# Patient Record
Sex: Male | Born: 1975
Health system: Southern US, Community
[De-identification: ages and names within clinical notes are randomized; demographics above are authoritative.]

## PROBLEM LIST (undated history)

## (undated) DIAGNOSIS — J449 Chronic obstructive pulmonary disease, unspecified: Secondary | ICD-10-CM

## (undated) DIAGNOSIS — J45909 Unspecified asthma, uncomplicated: Secondary | ICD-10-CM

## (undated) DIAGNOSIS — F17210 Nicotine dependence, cigarettes, uncomplicated: Secondary | ICD-10-CM

## (undated) DIAGNOSIS — I428 Other cardiomyopathies: Secondary | ICD-10-CM

## (undated) DIAGNOSIS — N182 Chronic kidney disease, stage 2 (mild): Secondary | ICD-10-CM

## (undated) DIAGNOSIS — I639 Cerebral infarction, unspecified: Secondary | ICD-10-CM

## (undated) DIAGNOSIS — I5022 Chronic systolic (congestive) heart failure: Secondary | ICD-10-CM

## (undated) DIAGNOSIS — Z9289 Personal history of other medical treatment: Secondary | ICD-10-CM

## (undated) DIAGNOSIS — Z9889 Other specified postprocedural states: Secondary | ICD-10-CM

## (undated) DIAGNOSIS — F1910 Other psychoactive substance abuse, uncomplicated: Secondary | ICD-10-CM

## (undated) HISTORY — DX: Chronic systolic (congestive) heart failure: I50.22

## (undated) HISTORY — DX: Nicotine dependence, cigarettes, uncomplicated: F17.210

## (undated) HISTORY — DX: Other psychoactive substance abuse, uncomplicated: F19.10

## (undated) HISTORY — DX: Other specified postprocedural states: Z98.890

## (undated) HISTORY — DX: Personal history of other medical treatment: Z92.89

## (undated) HISTORY — DX: Other cardiomyopathies: I42.8

---
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
## 2015-06-15 ENCOUNTER — Encounter (HOSPITAL_COMMUNITY): Payer: Self-pay | Admitting: Emergency Medicine

## 2015-06-15 ENCOUNTER — Emergency Department (HOSPITAL_COMMUNITY): Payer: Self-pay

## 2015-06-15 ENCOUNTER — Emergency Department (HOSPITAL_COMMUNITY)
Admission: EM | Admit: 2015-06-15 | Discharge: 2015-06-15 | Disposition: A | Discharge status: Home / Self Care | Payer: Self-pay | Attending: Emergency Medicine | Admitting: Emergency Medicine

## 2015-06-15 DIAGNOSIS — Z880 Allergy status to penicillin: Secondary | ICD-10-CM | POA: Insufficient documentation

## 2015-06-15 DIAGNOSIS — J45901 Unspecified asthma with (acute) exacerbation: Secondary | ICD-10-CM | POA: Insufficient documentation

## 2015-06-15 DIAGNOSIS — Z79899 Other long term (current) drug therapy: Secondary | ICD-10-CM | POA: Insufficient documentation

## 2015-06-15 HISTORY — DX: Unspecified asthma, uncomplicated: J45.909

## 2015-06-15 MED ORDER — DEXAMETHASONE 4 MG PO TABS
16.0000 mg | ORAL_TABLET | Freq: Once | ORAL | Status: AC
  Administered 2015-06-15: 16 mg via ORAL
  Filled 2015-06-15: qty 4

## 2015-06-15 MED ORDER — ALBUTEROL SULFATE HFA 108 (90 BASE) MCG/ACT IN AERS
2.0000 | INHALATION_SPRAY | Freq: Once | RESPIRATORY_TRACT | Status: AC
  Administered 2015-06-15: 2 via RESPIRATORY_TRACT
  Filled 2015-06-15: qty 6.7

## 2015-06-15 MED ORDER — ALBUTEROL SULFATE (2.5 MG/3ML) 0.083% IN NEBU
5.0000 mg | INHALATION_SOLUTION | Freq: Once | RESPIRATORY_TRACT | Status: AC
  Administered 2015-06-15: 5 mg via RESPIRATORY_TRACT
  Filled 2015-06-15: qty 6

## 2015-06-15 NOTE — Discharge Instructions (Signed)
Asthma, Adult Asthma is a recurring condition in which the airways tighten and narrow. Asthma can make it difficult to breathe. It can cause coughing, wheezing, and shortness of breath. Asthma episodes, also called asthma attacks, range from minor to life-threatening. Asthma cannot be cured, but medicines and lifestyle changes can help control it. CAUSES Asthma is believed to be caused by inherited (genetic) and environmental factors, but its exact cause is unknown. Asthma may be triggered by allergens, lung infections, or irritants in the air. Asthma triggers are different for each person. Common triggers include:   Animal dander.  Dust mites.  Cockroaches.  Pollen from trees or grass.  Mold.  Smoke.  Air pollutants such as dust, household cleaners, hair sprays, aerosol sprays, paint fumes, strong chemicals, or strong odors.  Cold air, weather changes, and winds (which increase molds and pollens in the air).  Strong emotional expressions such as crying or laughing hard.  Stress.  Certain medicines (such as aspirin) or types of drugs (such as beta-blockers).  Sulfites in foods and drinks. Foods and drinks that may contain sulfites include dried fruit, potato chips, and sparkling grape juice.  Infections or inflammatory conditions such as the flu, a cold, or an inflammation of the nasal membranes (rhinitis).  Gastroesophageal reflux disease (GERD).  Exercise or strenuous activity. SYMPTOMS Symptoms may occur immediately after asthma is triggered or many hours later. Symptoms include:  Wheezing.  Excessive nighttime or early morning coughing.  Frequent or severe coughing with a common cold.  Chest tightness.  Shortness of breath. DIAGNOSIS  The diagnosis of asthma is made by a review of your medical history and a physical exam. Tests may also be performed. These may include:  Lung function studies. These tests show how much air you breathe in and out.  Allergy  tests.  Imaging tests such as X-rays. TREATMENT  Asthma cannot be cured, but it can usually be controlled. Treatment involves identifying and avoiding your asthma triggers. It also involves medicines. There are 2 classes of medicine used for asthma treatment:   Controller medicines. These prevent asthma symptoms from occurring. They are usually taken every day.  Reliever or rescue medicines. These quickly relieve asthma symptoms. They are used as needed and provide short-term relief. Your health care provider will help you create an asthma action plan. An asthma action plan is a written plan for managing and treating your asthma attacks. It includes a list of your asthma triggers and how they may be avoided. It also includes information on when medicines should be taken and when their dosage should be changed. An action plan may also involve the use of a device called a peak flow meter. A peak flow meter measures how well the lungs are working. It helps you monitor your condition. HOME CARE INSTRUCTIONS   Take medicines only as directed by your health care provider. Speak with your health care provider if you have questions about how or when to take the medicines.  Use a peak flow meter as directed by your health care provider. Record and keep track of readings.  Understand and use the action plan to help minimize or stop an asthma attack without needing to seek medical care.  Control your home environment in the following ways to help prevent asthma attacks:  Do not smoke. Avoid being exposed to secondhand smoke.  Change your heating and air conditioning filter regularly.  Limit your use of fireplaces and wood stoves.  Get rid of pests (such as roaches  and mice) and their droppings.  Throw away plants if you see mold on them.  Clean your floors and dust regularly. Use unscented cleaning products.  Try to have someone else vacuum for you regularly. Stay out of rooms while they are  being vacuumed and for a short while afterward. If you vacuum, use a dust mask from a hardware store, a double-layered or microfilter vacuum cleaner bag, or a vacuum cleaner with a HEPA filter.  Replace carpet with wood, tile, or vinyl flooring. Carpet can trap dander and dust.  Use allergy-proof pillows, mattress covers, and box spring covers.  Wash bed sheets and blankets every week in hot water and dry them in a dryer.  Use blankets that are made of polyester or cotton.  Clean bathrooms and kitchens with bleach. If possible, have someone repaint the walls in these rooms with mold-resistant paint. Keep out of the rooms that are being cleaned and painted.  Wash hands frequently. SEEK MEDICAL CARE IF:   You have wheezing, shortness of breath, or a cough even if taking medicine to prevent attacks.  The colored mucus you cough up (sputum) is thicker than usual.  Your sputum changes from clear or white to yellow, green, gray, or bloody.  You have any problems that may be related to the medicines you are taking (such as a rash, itching, swelling, or trouble breathing).  You are using a reliever medicine more than 2-3 times per week.  Your peak flow is still at 50-79% of your personal best after following your action plan for 1 hour.  You have a fever. SEEK IMMEDIATE MEDICAL CARE IF:   You seem to be getting worse and are unresponsive to treatment during an asthma attack.  You are short of breath even at rest.  You get short of breath when doing very little physical activity.  You have difficulty eating, drinking, or talking due to asthma symptoms.  You develop chest pain.  You develop a fast heartbeat.  You have a bluish color to your lips or fingernails.  You are light-headed, dizzy, or faint.  Your peak flow is less than 50% of your personal best.   This information is not intended to replace advice given to you by your health care provider. Make sure you discuss any  questions you have with your health care provider.   Document Released: 08/08/2005 Document Revised: 04/29/2015 Document Reviewed: 03/07/2013 Elsevier Interactive Patient Education 2016 [employer].   Emergency Department Resource Guide 1) Find a Doctor and Pay Out of Pocket Although you won't have to find out who is covered by your insurance plan, it is a good idea to ask around and get recommendations. You will then need to call the office and see if the doctor you have chosen will accept you as a new patient and what types of options they offer for patients who are self-pay. Some doctors offer discounts or will set up payment plans for their patients who do not have insurance, but you will need to ask so you aren't surprised when you get to your appointment.  2) Contact Your Local Health Department Not all health departments have doctors that can see patients for sick visits, but many do, so it is worth a call to see if yours does. If you don't know where your local health department is, you can check in your phone book. The CDC also has a tool to help you locate your state's health department, and many state websites also  listings of all of their local health departments. ° °3) Find a Walk-in Clinic °If your illness is not likely to be very severe or complicated, you may want to try a walk in clinic. These are popping up all over the country in pharmacies, drugstores, and shopping centers. They're usually staffed by nurse practitioners or physician assistants that have been trained to treat common illnesses and complaints. They're usually fairly quick and inexpensive. However, if you have serious medical issues or chronic medical problems, these are probably not your best option. ° °No Primary Care Doctor: °- Call [facility] at  832-8000 - they can help you locate a primary care doctor that  accepts your insurance, provides certain services, etc. °- Physician Referral Service-  1-800-533-3463 ° °Chronic Pain Problems: °Organization         Address  Phone   Notes  °[facility]  (336) 297-2271 Patients need to be referred by their primary care doctor.  ° °Medication Assistance: °Organization         Address  Phone   Notes  °[place] [facility] [address] °[place], [place] [address] (336) 641-8030 --Must be a resident of [place] °-- Must have NO insurance coverage whatsoever (no Medicaid/ Medicare, etc.) °-- The pt. MUST have a primary care doctor that directs their care regularly and follows them in the community °  °[facility]  (866) 331-1348   °[employer]  (888) 892-1162   ° °Agencies that provide inexpensive medical care: °Organization         Address  Phone   Notes  °[facility]  (336) 832-8035   °[facility]    (336) 832-7272   °[facility] [address] °[place], [place] [address] (336) 832-4777   °[facility] [address], °[place] (336) 271-4999   °[facility]    (336) 373-0678   °[facility]    (336) 272-1050   °[facility] ° [address], [place] Phone:  (336) 832-4444, Fax:  (336) 832-4440 Hours of Operation:  9 am - 6 pm, M-F.  Also accepts Medicaid/Medicare and self-pay.  °[facility] ° [address], [place] Phone: (336) 832-3150, Fax: (336) 832-3151. Hours of Operation:  8:30 am - 5:30 pm, M-F.  Also accepts Medicaid and self-pay.  °[facility] [place] [address], [place] Phone: (336) 878-6027   °[facility] [address], [place], [place] (336)723-1848, Ext. 123 Mondays & Thursdays: 7-9 AM.  First 15 patients are seen on a first come, first serve basis. °  ° °Medicaid-accepting [place] Providers: ° °Organization         Address  Phone   Notes  °[facility] [address],  [place] (336) 641-2100 Also accepts self-pay patients.  °[facility] [address], [place] ° (336) 856-9996   °[facility] [address], [place] (336) 288-8857   °[facility] [address] [place] [address], [place] (336) 299-7000   °Veita Bland [address],   ° (336) 373-1557 Only accepts [place] Access Medicaid patients after they have their name applied to their card.  ° °Self-Pay (no insurance) in [place]: ° °Organization         Address  Phone     Notes  °Sickle Cell Patients, [facility] [address], [place] (336) 832-1970   °[facility] [address], [place] (336) 832-4400   °[facility] [place] ° [address],  (336) 992-4800   °[facility]/Dr. Osei-Bonsu ° [address] [place] [address], [place] or [address], [place] (336) 841-8500 Phone number for both [place] and [place] locations is the same.  °[facility] [address], [place] (336) 299-0000   °[facility] [address] [place] [address], [place] or [address] (336) 852-7530 °(336) 878-2260   °[facility] [address], [place] (336) 350-1642, phone; (336) 294-5005, fax Sees patients 1st and 3rd Saturday of every month.  Must not qualify for public or private insurance (i.e. Medicaid, Medicare, [place] Health Choice, Veterans' Benefits) • Household income should be no more than 200% of the poverty level •The clinic cannot treat you if you are pregnant or think you are pregnant • Sexually transmitted diseases are not treated at the clinic.  ° ° °Dental Care: °Organization         Address  Phone  Notes  °[facility] [address], [place] (336) 641-6152 Accepts children up to age 21 who are enrolled in  Medicaid or [place] Health Choice; pregnant women with a Medicaid card; and children who have applied for Medicaid or [place] Health Choice, but were declined, whose parents can pay a reduced fee at time of service.  °[facility] [place]  [address], [place] (336) 641-7733 Accepts children up to age 21 who are enrolled in Medicaid or [place] Health Choice; pregnant women with a Medicaid card; and children who have applied for Medicaid or [place] Health Choice, but were declined, whose parents can pay a reduced fee at time of service.  °[facility] ° [address],  (336) 641-4533 Patients are seen by appointment only. Walk-ins are not accepted. [facility] will see patients 18 years of age and older. °Monday - Tuesday (8am-5pm) °Most Wednesdays (8:30-5pm) °$30 per visit, cash only  °[facility] ° [address], [place] (336) 641-4533 Patients are seen by appointment only. Walk-ins are not accepted. [facility] will see patients 18 years of age and older. °One Wednesday Evening (Monthly: Volunteer Based).  $30 per visit, cash only  °[facility]  (919) 537-3737 for adults; Children under age 4, call Graduate Pediatric Dentistry at ([phone]) [phone]. Children aged 4-14, please call ([phone]) [phone] to request a pediatric application. ° Dental services are provided in all areas of dental care including fillings, crowns and bridges, complete and partial dentures, implants, gum treatment, root canals, and extractions. Preventive care is also provided. Treatment is provided to both adults and children. °Patients are selected via a lottery and there is often a waiting list. °  °[facility] [address], ° ° (336) 763-8833 [email or web address].drcivils.[email or web address] °  °[facility] [address], [place], [place] (336)723-1848, Ext. 123 Second and Fourth Thursday of each month, opens at 6:30  AM; Clinic ends at 9 AM.  Patients are seen on a first-come first-served basis, and a limited number are seen during each clinic.  ° °[facility] ° [address], [place], [place] (336) 723-7904     Eligibility Requirements °You must have lived in [place], or [place] counties for at least the last three months. °  You cannot be eligible for state or federal sponsored healthcare insurance, including Veterans Administration, Medicaid, or Medicare. °  You generally cannot be eligible for healthcare insurance through your employer.  °  How to apply: °Eligibility screenings are held every Tuesday and Wednesday afternoon from 1:00 pm until 4:00 pm. You do not need an appointment for the interview!  °[facility] [address], [place], [place] 336-631-2330   °[facility]  336-342-8273   °[facility]  336-703-3100   °[facility]  336-570-6415   ° °Behavioral Health Resources in the Community: °Intensive Outpatient Programs °Organization         Address  Phone  Notes  °[facility] [address], [place], [place] 336-878-6098   °[facility] [address], [place], [place] 336-832-9800   °[facility] [address], [place], [place] ° 336-882-2125   °[facility] [address],  °[place], [place] 1-800-853-5163 or 336-641-4981   °Substance Abuse Resources °Organization         Address  Phone  Notes  °[facility]  336-882-2125   °[facility]  336-784-9470   °[facility]  336-285-9073   °[facility]  336-845-3988   °Residential & Outpatient Substance Abuse Program  1-800-659-3381   °Psychological Services °Organization         Address  Phone  Notes  °[facility]  336- 832-9600   °[facility]  336- 378-7881   °[facility] [address], [place] 1-800-853-5163 or  336-641-4981   ° °Mobile Crisis Teams °Organization         Address  Phone  Notes  °[facility], Mobile Crisis Care Unit  1-877-626-1772   °Assertive °[facility] ° [address] [place], [place] 336-834-9664   °Sharon DeEsch [address] °[place] 336-554-5454   ° °Self-Help/Support Groups °Organization         Address  Phone             Notes  °[facility] - variety of support groups  336- 373-1402 Call for more information  °Narcotics Anonymous (NA), [facility] [address], °[place]  2 meetings at this location  ° °Residential Treatment Programs °Organization         Address  Phone  Notes  °[facility] [address],    °[place]  1-866-801-8205   °[facility] ° [address], [place], [place] 704-293-8524   °[facility] [address], [place] 336-845-3988 Admissions: 8am-3pm M-F  °[facility] [address]    °[place], [place] 336-841-1104   °[facility] [address], [place], [place] 336-379-7146   °[facility] [address]  °[place], [place] 336-285-9073   °[facility] Intensive Outpatient [address], [place], [place] 336-852-3033   °[facility] [address]  °[place], [place] 1-877-615-2722 or 336-784-9470   °[facility] [address] [place], [place] 336-227-7417 Accepts Medicaid  °[facility] [address]  °  1-800-659-3381 Substance Abuse/Addiction Treatment  ° °[place] Behavioral Health Resources °Organization           Address  Phone  Notes  [facility]  4402682006   Domenic Schwab, PhD [address] [place], [place]   618 458 9163 or (418) 171-6990   [place], [place] 516-798-4479   [address],  [place], [place] 647-242-8254 Insurance/Medicaid/sponsorship through [facility] [address]                                    [place], [place] (832)[phone] [place] [address], [place] (424)[phone]    Dr. Adele Schilder  ([phone]) [phone]   [facility] Dept. 1) [address] [place] 2) [place] 3)  [address], [place] [phone]  [phone]   [place] [phone] or [phone] (After Hours)

## 2015-06-15 NOTE — ED Notes (Signed)
Per pt, states SOB for a couple of days-states history of asthma, inhaler not working-states occasional sharp pain on left chest

## 2015-06-26 NOTE — ED Provider Notes (Signed)
CSN: [id number]     Arrival date & time 06/15/15  0902 History   First MD Initiated Contact with Patient 06/15/15 0919     Chief Complaint  Patient presents with  . Shortness of Breath     (Consider location/radiation/quality/duration/timing/severity/associated sxs/prior Treatment) HPI   39 year old male with dyspnea. Gradual onset about 2 days ago. Symptoms have been constant, but stable. He is currently out of his rescue inhaler. Occasional nonproductive cough. Denies any pain. No fever. No unusual leg pain or swelling. No sick contacts.   Past Medical History  Diagnosis Date  . Asthma    No past surgical history on file. No family history on file. Social History  Substance Use Topics  . Smoking status: Never Smoker   . Smokeless tobacco: None  . Alcohol Use: No    Review of Systems  All systems reviewed and negative, other than as noted in HPI.   Allergies  Penicillins  Home Medications   Prior to Admission medications   Medication Sig Start Date End Date Taking? Authorizing Provider  albuterol (PROVENTIL HFA;VENTOLIN HFA) 108 (90 BASE) MCG/ACT inhaler Inhale 1-2 puffs into the lungs every 6 (six) hours as needed for wheezing or shortness of breath.   Yes Historical Provider, MD  naproxen sodium (ANAPROX) 220 MG tablet Take 440 mg by mouth 2 (two) times daily as needed (pain).   Yes Historical Provider, MD   BP 126/74 mmHg  Pulse 78  Temp(Src) 98.5 F (36.9 C) (Oral)  Resp 13  SpO2 99% Physical Exam  Constitutional: He appears well-developed and well-nourished. No distress.  HENT:  Head: Normocephalic and atraumatic.  Eyes: Conjunctivae are normal. Right eye exhibits no discharge. Left eye exhibits no discharge.  Neck: Neck supple.  Cardiovascular: Normal rate, regular rhythm and normal heart sounds.  Exam reveals no gallop and no friction rub.   No murmur heard. Pulmonary/Chest: Effort normal. No respiratory distress. He has wheezes.  Space in complete  sentences. No increased work of breathing. Mild expiratory wheezing bilaterally.  Abdominal: Soft. He exhibits no distension. There is no tenderness.  Musculoskeletal: He exhibits no edema or tenderness.  Neurological: He is alert.  Skin: Skin is warm and dry.  Psychiatric: He has a normal mood and affect. His behavior is normal. Thought content normal.  Nursing note and vitals reviewed.   ED Course  Procedures (including critical care time) Labs Review Labs Reviewed - No data to display  Imaging Review No results found. I have personally reviewed and evaluated these images and lab results as part of my medical decision-making.   EKG Interpretation   Date/Time:  Monday June 15 2015 09:16:14 EDT Ventricular Rate:  79 PR Interval:  152 QRS Duration: 91 QT Interval:  392 QTC Calculation: 449 R Axis:   79 Text Interpretation:  Sinus rhythm Consider left ventricular hypertrophy  ED PHYSICIAN INTERPRETATION AVAILABLE IN [facility] Confirmed by  TEST, Record ([id number]) on 06/16/2015 7:55:00 AM      MDM   Final diagnoses:  Asthma exacerbation    39 year old male with dyspnea. Likely asthma exacerbation. Some mild wheezing on exam. No increased work of breathing. o2 sats normal on RA. Afebrile. Generally well appearing. Patient is currently out of his rescue inhaler. He was provided 1 on discharge.    Raeford Razor, MD 06/26/15 1440

## 2015-09-17 ENCOUNTER — Emergency Department (HOSPITAL_COMMUNITY)
Admission: EM | Admit: 2015-09-17 | Discharge: 2015-09-17 | Disposition: A | Discharge status: Home / Self Care | Payer: Self-pay | Attending: Emergency Medicine | Admitting: Emergency Medicine

## 2015-09-17 ENCOUNTER — Encounter (HOSPITAL_COMMUNITY): Payer: Self-pay | Admitting: Emergency Medicine

## 2015-09-17 DIAGNOSIS — Z79899 Other long term (current) drug therapy: Secondary | ICD-10-CM | POA: Insufficient documentation

## 2015-09-17 DIAGNOSIS — Z880 Allergy status to penicillin: Secondary | ICD-10-CM | POA: Insufficient documentation

## 2015-09-17 DIAGNOSIS — J4521 Mild intermittent asthma with (acute) exacerbation: Secondary | ICD-10-CM | POA: Insufficient documentation

## 2015-09-17 MED ORDER — IPRATROPIUM-ALBUTEROL 0.5-2.5 (3) MG/3ML IN SOLN
3.0000 mL | Freq: Once | RESPIRATORY_TRACT | Status: AC
  Administered 2015-09-17: 3 mL via RESPIRATORY_TRACT
  Filled 2015-09-17: qty 3

## 2015-09-17 MED ORDER — PREDNISONE 20 MG PO TABS
60.0000 mg | ORAL_TABLET | Freq: Once | ORAL | Status: AC
  Administered 2015-09-17: 60 mg via ORAL
  Filled 2015-09-17: qty 3

## 2015-09-17 MED ORDER — PREDNISONE 20 MG PO TABS: ORAL_TABLET | ORAL | Status: DC

## 2015-09-17 NOTE — ED Notes (Signed)
Per pt, states asthma symptoms-doesn't have an inhaler

## 2015-09-17 NOTE — Discharge Instructions (Signed)
Asthma, Acute Bronchospasm °Acute bronchospasm caused by asthma is also referred to as an asthma attack. Bronchospasm means your air passages become narrowed. The narrowing is caused by inflammation and tightening of the muscles in the air tubes (bronchi) in your lungs. This can make it hard to breathe or cause you to wheeze and cough. °CAUSES °Possible triggers are: °· Animal dander from the skin, hair, or feathers of animals. °· Dust mites contained in house dust. °· Cockroaches. °· Pollen from trees or grass. °· Mold. °· Cigarette or tobacco smoke. °· Air pollutants such as dust, household cleaners, hair sprays, aerosol sprays, paint fumes, strong chemicals, or strong odors. °· Cold air or weather changes. Cold air may trigger inflammation. Winds increase molds and pollens in the air. °· Strong emotions such as crying or laughing hard. °· Stress. °· Certain medicines such as aspirin or beta-blockers. °· Sulfites in foods and drinks, such as dried fruits and wine. °· Infections or inflammatory conditions, such as a flu, cold, or inflammation of the nasal membranes (rhinitis). °· Gastroesophageal reflux disease (GERD). GERD is a condition where stomach acid backs up into your esophagus. °· Exercise or strenuous activity. °SIGNS AND SYMPTOMS  °· Wheezing. °· Excessive coughing, particularly at night. °· Chest tightness. °· Shortness of breath. °DIAGNOSIS  °Your health care provider will ask you about your medical history and perform a physical exam. A chest X-ray or blood testing may be performed to look for other causes of your symptoms or other conditions that may have triggered your asthma attack.  °TREATMENT  °Treatment is aimed at reducing inflammation and opening up the airways in your lungs.  Most asthma attacks are treated with inhaled medicines. These include quick relief or rescue medicines (such as bronchodilators) and controller medicines (such as inhaled corticosteroids). These medicines are sometimes  given through an inhaler or a nebulizer. Systemic steroid medicine taken by mouth or given through an IV tube also can be used to reduce the inflammation when an attack is moderate or severe. Antibiotic medicines are only used if a bacterial infection is present.  °HOME CARE INSTRUCTIONS  °· Rest. °· Drink plenty of liquids. This helps the mucus to remain thin and be easily coughed up. Only use caffeine in moderation and do not use alcohol until you have recovered from your illness. °· Do not smoke. Avoid being exposed to secondhand smoke. °· You play a critical role in keeping yourself in good health. Avoid exposure to things that cause you to wheeze or to have breathing problems. °· Keep your medicines up-to-date and available. Carefully follow your health care provider's treatment plan. °· Take your medicine exactly as prescribed. °· When pollen or pollution is bad, keep windows closed and use an air conditioner or go to places with air conditioning. °· Asthma requires careful medical care. See your health care provider for a follow-up as advised. If you are more than [redacted] weeks pregnant and you were prescribed any new medicines, let your obstetrician know about the visit and how you are doing. Follow up with your health care provider as directed. °· After you have recovered from your asthma attack, make an appointment with your outpatient doctor to talk about ways to reduce the likelihood of future attacks. If you do not have a doctor who manages your asthma, make an appointment with a primary care doctor to discuss your asthma. °SEEK IMMEDIATE MEDICAL CARE IF:  °· You are getting worse. °· You have trouble breathing. If severe, call your local   emergency services (911 in the U.[place]  You develop chest pain or discomfort.  You are vomiting.  You are not able to keep fluids down.  You are coughing up yellow, green, brown, or bloody sputum.  You have a fever and your symptoms suddenly get worse.  You have  trouble swallowing. MAKE SURE YOU:   Understand these instructions.  Will watch your condition.  Will get help right away if you are not doing well or get worse.   This information is not intended to replace advice given to you by your health care provider. Make sure you discuss any questions you have with your health care provider.   Document Released: 11/23/2006 Document Revised: 08/13/2013 Document Reviewed: 02/13/2013 Elsevier Interactive Patient Education 2016 [employer].   Emergency Department Resource Guide 1) Find a Doctor and Pay Out of Pocket Although you won't have to find out who is covered by your insurance plan, it is a good idea to ask around and get recommendations. You will then need to call the office and see if the doctor you have chosen will accept you as a new patient and what types of options they offer for patients who are self-pay. Some doctors offer discounts or will set up payment plans for their patients who do not have insurance, but you will need to ask so you aren't surprised when you get to your appointment.  2) Contact Your Local Health Department Not all health departments have doctors that can see patients for sick visits, but many do, so it is worth a call to see if yours does. If you don't know where your local health department is, you can check in your phone book. The CDC also has a tool to help you locate your state's health department, and many state websites also have listings of all of their local health departments.  3) Find a [facility] If your illness is not likely to be very severe or complicated, you may want to try a walk in clinic. These are popping up all over the country in pharmacies, drugstores, and shopping centers. They're usually staffed by nurse practitioners or physician assistants that have been trained to treat common illnesses and complaints. They're usually fairly quick and inexpensive. However, if you have serious medical  issues or chronic medical problems, these are probably not your best option.  No Primary Care Doctor: - Call Health Connect at  [phone] - they can help you locate a primary care doctor that  accepts your insurance, provides certain services, etc. - Physician Referral Service- [phone]  Chronic Pain Problems: Organization         Address  Phone   Notes  [facility]  [phone] Patients need to be referred by their primary care doctor.   Medication Assistance: Organization         Address  Phone   Notes  [facility] Medication [facility] [address] [place], [place] [address] [phone] --Must be a resident of [facility] -- Must have NO insurance coverage whatsoever (no Medicaid/ Medicare, etc.) -- The pt. MUST have a primary care doctor that directs their care regularly and follows them in the community   [facility]ist  [phone]   [employer]  [phone]    Agencies that provide inexpensive medical care: Organization         Address  Phone   Notes  [place]  [phone]   Gershon Mussel  [facility]    [phone]   [facility] [place], [place] [address] [phone]   [place] [address] [place] [phone]   Planned Parenthood    [phone]   [facility]    [phone]   [place] and [place] [address], [place] Phone:  [phone], Fax:  [phone] Hours of Operation:  9 am - 6 pm, M-F.  Also accepts Medicaid/Medicare and self-pay.  [facility] for [place], [address], [place] Phone: [phone], Fax: [phone]. Hours of Operation:  8:30 am - 5:30 pm, M-F.  Also accepts Medicaid and self-pay.  [facility] [address], [place] Phone: [phone]   [place], [place], [place]  [phone], Ext. [phone] Mondays & Thursdays: 7-9 AM.  First 15 patients are seen on a first come, first serve basis.    [place] Providers:  Organization         Address  Phone   Notes  [facility] [address], [place] [phone] Also accepts self-pay patients.  [facility] [id number] [place], [place]  [phone]   [place], [address], [place] [phone]   [facility] [address], [place] [phone]   Lucianne Lei [address], [place]   [phone] Only accepts [place] Access [place] patients after they have their name applied to their card.   Self-Pay (no insurance) in [facility]:  Organization         Address  Phone   Notes  Sickle Cell Patients, [facility] [place] [phone]   [facility] [place] [phone]   [facility] [place]  [place], [place],  [phone]   [facility]/Dr. Osei-Bonsu  [address], [place] or [address], [place] [phone] Phone number for both [place] and [place] locations is the same.  Urgent Medical and [facility] [address], [place] [phone]   [facility] [address], [place] or [address] [phone]   [facility] [address] [place] [phone], phone; [phone], fax Sees patients 1st and 3rd Saturday of every month.  Must not qualify for public or private insurance (i.e. Medicaid, Medicare, [place] Health Choice, Veterans' Benefits)  Household income should be no more than 200% of the poverty level The clinic cannot treat you if you are pregnant or think you are pregnant  Sexually transmitted  diseases are not treated at the clinic.    Dental Care: Organization         Address  Phone  Notes  [facility] Department of [facility] [place] [phone] Accepts children up to age 52 who are enrolled in [place] or [place]; pregnant women with a Medicaid card; and children who have applied for Medicaid or [place] Health Choice, but were declined, whose parents can pay a reduced fee at time of service.  [facility] Department of [facility] [facility]  [address], [employer]  [place] [phone] Accepts children up to age 62 who are enrolled in Medicaid or [place]; pregnant women with a Medicaid card; and children who have applied for Medicaid or [place] Health Choice, but were declined, whose parents can pay a reduced fee at time of service.  [place] Adult Dental Access PROGRAM  [place] [phone] Patients are seen by appointment only. Walk-ins are not accepted. [place] will see patients 22 years of age and older. Monday - Tuesday (8am-5pm) Most Wednesdays (8:30-5pm) $30 per visit, cash only  [facility] Adult Dental Access PROGRAM  [address], [facility] [phone] Patients are seen by appointment only. Walk-ins are not accepted. [place] will see patients 76 years of age and older. One Wednesday Evening (Monthly: Volunteer Based).  $30 per visit, cash only  [place]  [phone] for adults; Children under age 15, call Graduate Pediatric Dentistry at [phone]. Children aged 43-14, please call [phone] to request a pediatric application.  Dental services are provided in all areas of dental care including fillings, crowns and bridges, complete and partial dentures, implants, gum treatment, root canals, and extractions. Preventive care is also provided. Treatment is provided to both adults and children. Patients are selected via a  lottery and there is often a waiting list.   [facility] [address], [place]  [phone] [email or web address]   [facility] [address] [place], [place] [phone], Ext. [phone] Second and Fourth Thursday of each month, opens at 6:30 AM; Clinic ends at 9 AM.  Patients are seen on a first-come first-served basis, and a limited number are seen during each clinic.   [facility]  [address] Hillard Danker [place], [place] [phone]   Eligibility Requirements You must have lived in [place], [place], or [place] counties for at least the last three months.   You cannot be eligible for state or federal sponsored [employer], including [employer], [place], or [employer].   You generally cannot be eligible for healthcare insurance through your employer.    How to apply: Eligibility screenings are held every Tuesday and Wednesday afternoon from 1:00 pm until 4:00 pm. You do not need an appointment for the interview!  [facility] [address], [place], [place]   [place]  [place] Department  [place]  [phone]    Behavioral Health Resources in the Community: Intensive Outpatient Programs Organization         Address  Phone  Notes  [place]. [address] [place], [place] [phone]   [facility] Outpatient [address] [place], [place]   ADS: Alcohol & Drug Svcs [address] [place], [place]   [place] [address]  [place], [place] or [phone]   Substance Abuse Resources Organization         Address  Phone  Notes  Alcohol and Drug Services  [phone]   [place]  [phone]   [facility] [place]   Chinita Pester  [phone]   Residential &  Outpatient Substance Abuse Program  [phone]   Psychological Services Organization         Address  Phone  Notes  [place]  [place]  [phone]  [place] [address], [place] or [phone]    Mobile Crisis Teams Organization         Address  Phone  Notes  [facility], Mobile Crisis Care Unit  [phone]   [facility]ive Psychotherapeutic Services  [address] [place], [place]   [place] [address], [place] [phone]    Self-Help/Support Groups Organization         Address  Phone             Notes  [place]. of [place] - variety of support groups  [place] Call for more information  Narcotics Anonymous (NA), Caring [facility] [address], [employer] [place]  2 meetings at this location   Special educational needs teacher         Address  Phone  Notes  ASAP Residential Treatment [place],    [place]  [phone]   [facility]  [address] [place] [id number], [place], [place]   [place], [place] [phone] Admissions: 8am-3pm M-F  Incentives Substance [place] [address]    [place], [place] [phone]   [facility] [address] [place], [place], [place]   [facility] [address]  [place], [place]   [facility] Programs - Intensive Outpatient [address] Kristeen [address], [place], [place]   [facility] ([place].) [place].,  [place], [place] [phone] or [phone]   [facility]idential Treatment Services (RTS) [address] [employer], [place] Accepts Medicaid  [facility] [place] [address],  [place] [phone] Substance Abuse/Addiction Treatment   [facility] Organization          Address  Phone  Notes  [facility]  [phone]   Domenic Schwab, PhD [address] [place]lis [place], [place]   [phone] or [phone]   [place], [place] [phone]   [facility] [address], [place], [place] [phone] Insurance/Medicaid/sponsorship through [facility] and Families [address] [place]                                    [place], [place] [phone] [place] [address], [place] [phone]    Dr. Adele Schilder  [phone]   [facility] Dept. 1) [address] [place] 2) [place] 3)  [place] [address], [place] [phone]  [phone]   [place] [phone] or [phone] (After Hours)

## 2015-09-17 NOTE — ED Provider Notes (Signed)
CSN: [id number]     Arrival date & time 09/17/15  1254 History  By signing my name below, I, Mark [name], attest that this documentation has been prepared under the direction and in the presence of non-physician practitioner, Fayrene Helper, PA-C. Electronically Signed: Freida [name], Scribe. 09/17/2015. 2:21 PM.  Chief Complaint  Patient presents with  . Asthma   The history is provided by the patient. No language interpreter was used.    HPI Comments:  Mark [name] is a 40 y.o. male with a history of asthma who presents to the Emergency Department complaining of progressively worsening SOB x 2-3 months. Pt was previously  incarcerated and his asthma was well controlled with albuterol inhaler and homeolab real relief asthma respiratory care at that time; he was released ~3 months ago. He reports associated wheezing and  dry cough. His symptoms are exacerbated with exertion. Pt denies congestion, sore throat, sneezing, rhinorrhea, and fever. No alleviating factors noted. He does not currently have any treatments at home. He smokes ~  1 pack every 2 days. He denies h/o asthma exacerbation requiring intubation or hospitalization.    Past Medical History  Diagnosis Date  . Asthma    History reviewed. No pertinent past surgical history. No family history on file. Social History  Substance Use Topics  . Smoking status: Never Smoker   . Smokeless tobacco: None  . Alcohol Use: No    Review of Systems  Constitutional: Negative for fever.  HENT: Negative for congestion, rhinorrhea, sneezing and sore throat.   Respiratory: Positive for cough, shortness of breath and wheezing.      Allergies  Penicillins  Home Medications   Prior to Admission medications   Medication Sig Start Date End Date Taking? Authorizing Provider  albuterol (PROVENTIL HFA;VENTOLIN HFA) 108 (90 BASE) MCG/ACT inhaler Inhale 1-2 puffs into the lungs every 6 (six) hours as needed for wheezing or shortness of breath.     Historical Provider, MD  naproxen sodium (ANAPROX) 220 MG tablet Take 440 mg by mouth 2 (two) times daily as needed (pain).    Historical Provider, MD   BP 116/74 mmHg  Pulse 109  Temp(Src) 98 F (36.7 C) (Oral)  Resp 20  SpO2 98% Physical Exam  Constitutional: He is oriented to person, place, and time. He appears well-developed and well-nourished. No distress.  HENT:  Head: Normocephalic and atraumatic.  Eyes: Conjunctivae are normal.  Cardiovascular: Normal rate.   Pulmonary/Chest: Effort normal. No respiratory distress. He has no wheezes. He has no rales.  Decreased breathe sounds without wheezes or rhonchi   Abdominal: He exhibits no distension.  Neurological: He is alert and oriented to person, place, and time.  Skin: Skin is warm and dry.  Psychiatric: He has a normal mood and affect.  Nursing note and vitals reviewed.   ED Course  Procedures   DIAGNOSTIC STUDIES:  Oxygen Saturation is 98% on RA, normal by my interpretation.    COORDINATION OF CARE:  1:51 PM Will administer breathing treatment in ED and discharge home with inhaler and steroids. Discussed treatment plan with pt at bedside and pt agreed to plan.    MDM   Final diagnoses:  Asthma exacerbation attacks, mild intermittent    BP 116/74 mmHg  Pulse 109  Temp(Src) 98 F (36.7 C) (Oral)  Resp 20  SpO2 100%   Patient with mild signs and symptoms of asthma. Oxygen saturation is above 90%. No accessory muscle use, no cyanosis. Duoneb given in the ED.  Patient feels improved after treatment. He ambulates without any respiratory discomfort.  Will discharge with prednisone course. Pt instructed to follow up with PCP. Patient hemodynamically stable. Discussed return precautions. Appears safe for discharge.    I personally performed the services described in this documentation, which was scribed in my presence. The recorded information has been reviewed and is accurate.      Fayrene Helper, PA-C 09/17/15  1429  Benjiman Core, MD 09/17/15 1535

## 2015-10-20 ENCOUNTER — Emergency Department (HOSPITAL_COMMUNITY)
Admission: EM | Admit: 2015-10-20 | Discharge: 2015-10-20 | Disposition: A | Discharge status: Home / Self Care | Payer: Self-pay | Attending: Emergency Medicine | Admitting: Emergency Medicine

## 2015-10-20 ENCOUNTER — Encounter (HOSPITAL_COMMUNITY): Payer: Self-pay | Admitting: Cardiology

## 2015-10-20 DIAGNOSIS — R0602 Shortness of breath: Secondary | ICD-10-CM

## 2015-10-20 DIAGNOSIS — J45901 Unspecified asthma with (acute) exacerbation: Secondary | ICD-10-CM | POA: Insufficient documentation

## 2015-10-20 DIAGNOSIS — F17200 Nicotine dependence, unspecified, uncomplicated: Secondary | ICD-10-CM | POA: Insufficient documentation

## 2015-10-20 NOTE — ED Notes (Signed)
Pt reports SOb and wheezing that started a couple of days ago. Also reports a hx of asthma. States he is out of his inhaler at home.

## 2015-10-20 NOTE — ED Notes (Signed)
Pt refuses iv labs and xray. Will inform MD.

## 2015-10-20 NOTE — ED Notes (Signed)
Pt in room putting on clothing states he is leaving and will go to aniother hospital for treatmant.

## 2015-10-20 NOTE — ED Notes (Signed)
Changed to acuity 3 per T wave inversion.

## 2015-10-20 NOTE — ED Provider Notes (Signed)
MSE was initiated and I personally evaluated the patient and placed orders (if any) at  10:24 AM on October 20, 2015.  The patient appears stable so that the remainder of the MSE may be completed by another provider.  Pt here with cp and sob that has been going on for 1 week. He is out of his inhaler. He states that he is having some chest tightness and it was worse this morning. Pt is going to be moved out of fast track as ekg showed some t wave inversion  Teressa Lower, NP 10/20/15 1025  Rolland Porter, MD 10/20/15 1053

## 2015-11-29 ENCOUNTER — Emergency Department (HOSPITAL_COMMUNITY): Payer: Medicaid Other

## 2015-11-29 ENCOUNTER — Emergency Department (HOSPITAL_COMMUNITY)
Admission: EM | Admit: 2015-11-29 | Discharge: 2015-11-29 | Disposition: A | Discharge status: Home / Self Care | Payer: Medicaid Other | Attending: Emergency Medicine | Admitting: Emergency Medicine

## 2015-11-29 ENCOUNTER — Encounter (HOSPITAL_COMMUNITY): Payer: Self-pay | Admitting: Emergency Medicine

## 2015-11-29 DIAGNOSIS — R1110 Vomiting, unspecified: Secondary | ICD-10-CM | POA: Diagnosis not present

## 2015-11-29 DIAGNOSIS — Z880 Allergy status to penicillin: Secondary | ICD-10-CM | POA: Diagnosis not present

## 2015-11-29 DIAGNOSIS — J45901 Unspecified asthma with (acute) exacerbation: Secondary | ICD-10-CM | POA: Diagnosis not present

## 2015-11-29 DIAGNOSIS — Z79899 Other long term (current) drug therapy: Secondary | ICD-10-CM | POA: Insufficient documentation

## 2015-11-29 DIAGNOSIS — R05 Cough: Secondary | ICD-10-CM | POA: Diagnosis present

## 2015-11-29 DIAGNOSIS — F17200 Nicotine dependence, unspecified, uncomplicated: Secondary | ICD-10-CM | POA: Insufficient documentation

## 2015-11-29 MED ORDER — IBUPROFEN 800 MG PO TABS: 800.0000 mg | ORAL_TABLET | Freq: Once | ORAL | Status: DC

## 2015-11-29 MED ORDER — PREDNISONE 50 MG PO TABS: ORAL_TABLET | ORAL | Status: DC

## 2015-11-29 MED ORDER — ALBUTEROL SULFATE HFA 108 (90 BASE) MCG/ACT IN AERS
1.0000 | INHALATION_SPRAY | RESPIRATORY_TRACT | Status: DC | PRN
Start: 2015-11-29 — End: 2015-11-29
  Administered 2015-11-29: 1 via RESPIRATORY_TRACT
  Filled 2015-11-29: qty 6.7

## 2015-11-29 MED ORDER — IPRATROPIUM-ALBUTEROL 0.5-2.5 (3) MG/3ML IN SOLN
3.0000 mL | Freq: Once | RESPIRATORY_TRACT | Status: AC
  Administered 2015-11-29: 3 mL via RESPIRATORY_TRACT
  Filled 2015-11-29: qty 3

## 2015-11-29 MED ORDER — IBUPROFEN 800 MG PO TABS
800.0000 mg | ORAL_TABLET | Freq: Once | ORAL | Status: AC
  Administered 2015-11-29: 800 mg via ORAL
  Filled 2015-11-29: qty 1

## 2015-11-29 MED ORDER — PREDNISONE 20 MG PO TABS
60.0000 mg | ORAL_TABLET | Freq: Once | ORAL | Status: AC
  Administered 2015-11-29: 60 mg via ORAL
  Filled 2015-11-29: qty 3

## 2015-11-29 NOTE — ED Notes (Signed)
Pt states that he has had a cough x 3 months. Has been previously evaluated but cannot get rid of it. Smoker. Asthma. Alert and oriented.

## 2015-11-29 NOTE — ED Notes (Signed)
PT AMBULATED IN THE HALL, O2 SAT 97-99% ON R/A. NO SOB OR DIFFICULTY BREATHING WHILE WALKING. PROVIDER MADE AWARE.

## 2015-11-29 NOTE — ED Notes (Signed)
PT DISCHARGED. INSTRUCTIONS AND PRESCRIPTION GIVEN. AAOX3. PT IN NO APPARENT DISTRESS. THE OPPORTUNITY TO ASK QUESTIONS WAS PROVIDED. 

## 2015-11-29 NOTE — Discharge Instructions (Signed)
Asthma, Adult Asthma is a recurring condition in which the airways tighten and narrow. Asthma can make it difficult to breathe. It can cause coughing, wheezing, and shortness of breath. Asthma episodes, also called asthma attacks, range from minor to life-threatening. Asthma cannot be cured, but medicines and lifestyle changes can help control it. CAUSES Asthma is believed to be caused by inherited (genetic) and environmental factors, but its exact cause is unknown. Asthma may be triggered by allergens, lung infections, or irritants in the air. Asthma triggers are different for each person. Common triggers include:   Animal dander.  Dust mites.  Cockroaches.  Pollen from trees or grass.  Mold.  Smoke.  Air pollutants such as dust, household cleaners, hair sprays, aerosol sprays, paint fumes, strong chemicals, or strong odors.  Cold air, weather changes, and winds (which increase molds and pollens in the air).  Strong emotional expressions such as crying or laughing hard.  Stress.  Certain medicines (such as aspirin) or types of drugs (such as beta-blockers).  Sulfites in foods and drinks. Foods and drinks that may contain sulfites include dried fruit, potato chips, and sparkling grape juice.  Infections or inflammatory conditions such as the flu, a cold, or an inflammation of the nasal membranes (rhinitis).  Gastroesophageal reflux disease (GERD).  Exercise or strenuous activity. SYMPTOMS Symptoms may occur immediately after asthma is triggered or many hours later. Symptoms include:  Wheezing.  Excessive nighttime or early morning coughing.  Frequent or severe coughing with a common cold.  Chest tightness.  Shortness of breath. DIAGNOSIS  The diagnosis of asthma is made by a review of your medical history and a physical exam. Tests may also be performed. These may include:  Lung function studies. These tests show how much air you breathe in and out.  Allergy  tests.  Imaging tests such as X-rays. TREATMENT  Asthma cannot be cured, but it can usually be controlled. Treatment involves identifying and avoiding your asthma triggers. It also involves medicines. There are 2 classes of medicine used for asthma treatment:   Controller medicines. These prevent asthma symptoms from occurring. They are usually taken every day.  Reliever or rescue medicines. These quickly relieve asthma symptoms. They are used as needed and provide short-term relief. Your health care provider will help you create an asthma action plan. An asthma action plan is a written plan for managing and treating your asthma attacks. It includes a list of your asthma triggers and how they may be avoided. It also includes information on when medicines should be taken and when their dosage should be changed. An action plan may also involve the use of a device called a peak flow meter. A peak flow meter measures how well the lungs are working. It helps you monitor your condition. HOME CARE INSTRUCTIONS   Take medicines only as directed by your health care provider. Speak with your health care provider if you have questions about how or when to take the medicines.  Use a peak flow meter as directed by your health care provider. Record and keep track of readings.  Understand and use the action plan to help minimize or stop an asthma attack without needing to seek medical care.  Control your home environment in the following ways to help prevent asthma attacks:  Do not smoke. Avoid being exposed to secondhand smoke.  Change your heating and air conditioning filter regularly.  Limit your use of fireplaces and wood stoves.  Get rid of pests (such as roaches  and mice) and their droppings.  Throw away plants if you see mold on them.  Clean your floors and dust regularly. Use unscented cleaning products.  Try to have someone else vacuum for you regularly. Stay out of rooms while they are  being vacuumed and for a short while afterward. If you vacuum, use a dust mask from a hardware store, a double-layered or microfilter vacuum cleaner bag, or a vacuum cleaner with a HEPA filter.  Replace carpet with wood, tile, or vinyl flooring. Carpet can trap dander and dust.  Use allergy-proof pillows, mattress covers, and box spring covers.  Wash bed sheets and blankets every week in hot water and dry them in a dryer.  Use blankets that are made of polyester or cotton.  Clean bathrooms and kitchens with bleach. If possible, have someone repaint the walls in these rooms with mold-resistant paint. Keep out of the rooms that are being cleaned and painted.  Wash hands frequently. SEEK MEDICAL CARE IF:   You have wheezing, shortness of breath, or a cough even if taking medicine to prevent attacks.  The colored mucus you cough up (sputum) is thicker than usual.  Your sputum changes from clear or white to yellow, green, gray, or bloody.  You have any problems that may be related to the medicines you are taking (such as a rash, itching, swelling, or trouble breathing).  You are using a reliever medicine more than 2-3 times per week.  Your peak flow is still at 50-79% of your personal best after following your action plan for 1 hour.  You have a fever. SEEK IMMEDIATE MEDICAL CARE IF:   You seem to be getting worse and are unresponsive to treatment during an asthma attack.  You are short of breath even at rest.  You get short of breath when doing very little physical activity.  You have difficulty eating, drinking, or talking due to asthma symptoms.  You develop chest pain.  You develop a fast heartbeat.  You have a bluish color to your lips or fingernails.  You are light-headed, dizzy, or faint.  Your peak flow is less than 50% of your personal best.   This information is not intended to replace advice given to you by your health care provider. Make sure you discuss any  questions you have with your health care provider.   Document Released: 08/08/2005 Document Revised: 04/29/2015 Document Reviewed: 03/07/2013 Elsevier Interactive Patient Education 2016 [employer]. Smoking Cessation, Tips for Success If you are ready to quit smoking, congratulations! You have chosen to help yourself be healthier. Cigarettes bring nicotine, tar, carbon monoxide, and other irritants into your body. Your lungs, heart, and blood vessels will be able to work better without these poisons. There are many different ways to quit smoking. Nicotine gum, nicotine patches, a nicotine inhaler, or nicotine nasal spray can help with physical craving. Hypnosis, support groups, and medicines help break the habit of smoking. WHAT THINGS CAN I DO TO MAKE QUITTING EASIER?  Here are some tips to help you quit for good:  Pick a date when you will quit smoking completely. Tell all of your friends and family about your plan to quit on that date.  Do not try to slowly cut down on the number of cigarettes you are smoking. Pick a quit date and quit smoking completely starting on that day.  Throw away all cigarettes.   Clean and remove all ashtrays from your home, work, and car.  On a card, write  down your reasons for quitting. Carry the card with you and read it when you get the urge to smoke.  Cleanse your body of nicotine. Drink enough water and fluids to keep your urine clear or pale yellow. Do this after quitting to flush the nicotine from your body.  Learn to predict your moods. Do not let a bad situation be your excuse to have a cigarette. Some situations in your life might tempt you into wanting a cigarette.  Never have "just one" cigarette. It leads to wanting another and another. Remind yourself of your decision to quit.  Change habits associated with smoking. If you smoked while driving or when feeling stressed, try other activities to replace smoking. Stand up when drinking your coffee.  Brush your teeth after eating. Sit in a different chair when you read the paper. Avoid alcohol while trying to quit, and try to drink fewer caffeinated beverages. Alcohol and caffeine may urge you to smoke.  Avoid foods and drinks that can trigger a desire to smoke, such as sugary or spicy foods and alcohol.  Ask people who smoke not to smoke around you.  Have something planned to do right after eating or having a cup of coffee. For example, plan to take a walk or exercise.  Try a relaxation exercise to calm you down and decrease your stress. Remember, you may be tense and nervous for the first 2 weeks after you quit, but this will pass.  Find new activities to keep your hands busy. Play with a pen, coin, or rubber band. Doodle or draw things on paper.  Brush your teeth right after eating. This will help cut down on the craving for the taste of tobacco after meals. You can also try mouthwash.   Use oral substitutes in place of cigarettes. Try using lemon drops, carrots, cinnamon sticks, or chewing gum. Keep them handy so they are available when you have the urge to smoke.  When you have the urge to smoke, try deep breathing.  Designate your home as a nonsmoking area.  If you are a heavy smoker, ask your health care provider about a prescription for nicotine chewing gum. It can ease your withdrawal from nicotine.  Reward yourself. Set aside the cigarette money you save and buy yourself something nice.  Look for support from others. Join a support group or smoking cessation program. Ask someone at home or at work to help you with your plan to quit smoking.  Always ask yourself, "Do I need this cigarette or is this just a reflex?" Tell yourself, "Today, I choose not to smoke," or "I do not want to smoke." You are reminding yourself of your decision to quit.  Do not replace cigarette smoking with electronic cigarettes (commonly called e-cigarettes). The safety of e-cigarettes is unknown, and  some may contain harmful chemicals.  If you relapse, do not give up! Plan ahead and think about what you will do the next time you get the urge to smoke. HOW WILL I FEEL WHEN I QUIT SMOKING? You may have symptoms of withdrawal because your body is used to nicotine (the addictive substance in cigarettes). You may crave cigarettes, be irritable, feel very hungry, cough often, get headaches, or have difficulty concentrating. The withdrawal symptoms are only temporary. They are strongest when you first quit but will go away within 10-14 days. When withdrawal symptoms occur, stay in control. Think about your reasons for quitting. Remind yourself that these are signs that your body is  healing and getting used to being without cigarettes. Remember that withdrawal symptoms are easier to treat than the major diseases that smoking can cause.  Even after the withdrawal is over, expect periodic urges to smoke. However, these cravings are generally short lived and will go away whether you smoke or not. Do not smoke! WHAT RESOURCES ARE AVAILABLE TO HELP ME QUIT SMOKING? Your health care provider can direct you to community resources or hospitals for support, which may include:  Group support.  Education.  Hypnosis.  Therapy.   This information is not intended to replace advice given to you by your health care provider. Make sure you discuss any questions you have with your health care provider.   Document Released: 05/06/2004 Document Revised: 08/29/2014 Document Reviewed: 01/24/2013 Elsevier Interactive Patient Education [employer]

## 2015-11-29 NOTE — ED Provider Notes (Signed)
CSN: [id number]     Arrival date & time 11/29/15  1819 History  By signing my name below, I, Mark [name], attest that this documentation has been prepared under the direction and in the presence of  Mariabelen Pressly, PA-C. Electronically Signed: Doreatha [name], ED Scribe. 11/29/2015. 7:11 PM.      Chief Complaint  Patient presents with  . Cough   The history is provided by the patient. No language interpreter was used.   HPI Comments: Mark [name] is a 40 y.o. male with h/o asthma who presents to the Emergency Department complaining of moderate, intermittent, worsening cough for 3 months. Pt states associated exertional SOB, right-sided chest tenderness secondary to cough, wheezing, rhinorrhea, one episode of post-tussive emesis yesterday. He states this his chest pain is only present when coughing. He describes it as a soreness and "rubs on it" to improve it. Pt states that his current symptoms are similar to prior asthma exacerbations. He states that he ran out of his inhaler yesterday, but previously used it 1-2x/day. Pt notes this inhaler did not provide him complete relief of his symptoms. Per family member, he has been evaluated multiple times in the ED for his symptoms. Pt is not currently followed by a PCP. He is a current daily smoker at half pack per day. Denies sputum production. Denies fever, chills, dizziness, syncope, diaphoresis, abdominal pain, nausea, weakness or gait issues. He has no other complaints today.   Past Medical History  Diagnosis Date  . Asthma    History reviewed. No pertinent past surgical history. History reviewed. No pertinent family history. Social History  Substance Use Topics  . Smoking status: Current Every Day Smoker  . Smokeless tobacco: None  . Alcohol Use: No    Review of Systems  Constitutional: Negative for fever and chills.  HENT: Positive for rhinorrhea. Negative for congestion.   Respiratory: Positive for cough, shortness of breath and wheezing.    Cardiovascular: Positive for chest pain ( secondary to cough).  Gastrointestinal: Positive for vomiting ( post-tussive). Negative for nausea and abdominal pain.  All other systems reviewed and are negative.  Allergies  Hydrocodone and Penicillins  Home Medications   Prior to Admission medications   Medication Sig Start Date End Date Taking? Authorizing Provider  albuterol (PROVENTIL HFA;VENTOLIN HFA) 108 (90 BASE) MCG/ACT inhaler Inhale 1-2 puffs into the lungs every 6 (six) hours as needed for wheezing or shortness of breath.    Historical Provider, MD  naproxen sodium (ANAPROX) 220 MG tablet Take 440 mg by mouth 2 (two) times daily as needed (pain).    Historical Provider, MD  predniSONE (DELTASONE) 20 MG tablet 3 tabs po day one, then 2 tabs daily x 4 days 09/17/15   Fayrene Helper, PA-C  predniSONE (DELTASONE) 50 MG tablet Take one tablet once a day for 5 days 11/29/15   Yanky Vanderburg, PA-C   BP 105/61 mmHg  Pulse 100  Temp(Src) 98 F (36.7 C) (Oral)  Resp 20  SpO2 100% Physical Exam  Constitutional: He is oriented to person, place, and time. He appears well-developed and well-nourished. No distress.  No acute distress. Dry cough intermittently  HENT:  Head: Normocephalic and atraumatic.  Nose: Nose normal.  Mouth/Throat: Oropharynx is clear and moist. No oropharyngeal exudate.  Eyes: Conjunctivae are normal. Right eye exhibits no discharge. Left eye exhibits no discharge.  Neck: Normal range of motion. Neck supple.  Cardiovascular: Normal rate, regular rhythm and normal heart sounds.   Pulmonary/Chest: Effort normal.  No respiratory distress. He has wheezes. He exhibits tenderness (right sided).  Reproducible chest pain over right anterior chest wall. No bony deformities of chest wall. Lungs with some wheezing most prominently in right upper fields. Good air movement throughout. Breathing unlabored and pt in in no respiratory distress.   Musculoskeletal: Normal range of motion.   Walks with a steady gait unassisted  Lymphadenopathy:    He has no cervical adenopathy.  Neurological: He is alert and oriented to person, place, and time.  Skin: Skin is warm and dry.  Psychiatric: He has a normal mood and affect. His behavior is normal.  Nursing note and vitals reviewed.   ED Course  Procedures (including critical care time) DIAGNOSTIC STUDIES: Oxygen Saturation is 100% on RA, normal by my interpretation.    COORDINATION OF CARE: 7:10 PM Discussed treatment plan with pt at bedside which includes CXR and pt agreed to plan.  Imaging Review Dg Chest 2 View  11/29/2015  CLINICAL DATA:  Shortness of Breath EXAM: CHEST  2 VIEW COMPARISON:  None. FINDINGS: The heart size and mediastinal contours are within normal limits. Both lungs are clear. The visualized skeletal structures are unremarkable. IMPRESSION: No active cardiopulmonary disease. Electronically Signed   By: Alcide Clever M.D.   On: 11/29/2015 19:26   I have personally reviewed and evaluated these images as part of my medical decision-making.   MDM   Final diagnoses:  Asthma exacerbation   Patient presenting with cough, wheezing, chest tenderness x 3 months. Previously seen in ED for asthma exacerbations. Pt continues to smoke daily and does not follow with a PCP. Afebrile and oxygen 100%. Wheezing noted on exam. Good air movement throughout and pt is in no respiratory distress. Chest pain is reproducible; likely secondary to his chronic coughing. Treated with duoneb in ED with improvement in lung sounds and symptoms. Negative CXR. Patient ambulated in ED with O2 saturations maintained >90. There are no current signs of respiratory distress. Prednisone given in the ED and pt will be discharged with 5 day burst and albuterol. Long discussion with patient about the importance of PCP follow-up as his asthma appears to becoming more persistent. I have also discussed that there is a likelihood that his cough and SOB is  due to his daily smoking habit. I discussed the consequences of asthmatics smoking cigarettes and that he is unlikely to significantly improve his breathing status without quitting. I have offered resources to aid in his quitting. I have strongly encouraged the patient to schedule a follow-up appointment with a PCP. Discussed that the PCP can help him quit smoking and closely monitor his asthma. I discussed that in the emergency department, we are not able to monitor his asthma symptoms and he will need closer follow-up than what we can provide here. Return precautions given in discharge paperwork and discussed with pt at bedside. Pt stable for discharge  I personally performed the services described in this documentation, which was scribed in my presence. The recorded information has been reviewed and is accurate.   Alveta Heimlich, PA-C 12/01/15 1557  Melene Plan, DO 12/02/15 2120

## 2016-01-03 ENCOUNTER — Observation Stay (HOSPITAL_COMMUNITY): Payer: Medicaid Other

## 2016-01-03 ENCOUNTER — Inpatient Hospital Stay (HOSPITAL_COMMUNITY)
Admission: EM | Admit: 2016-01-03 | Discharge: 2016-01-06 | DRG: 286 | Disposition: A | Discharge status: Home / Self Care | Payer: Medicaid Other | Attending: Internal Medicine | Admitting: Internal Medicine

## 2016-01-03 ENCOUNTER — Emergency Department (HOSPITAL_COMMUNITY): Payer: Medicaid Other

## 2016-01-03 ENCOUNTER — Observation Stay (HOSPITAL_BASED_OUTPATIENT_CLINIC_OR_DEPARTMENT_OTHER): Payer: Medicaid Other

## 2016-01-03 ENCOUNTER — Encounter (HOSPITAL_COMMUNITY): Payer: Self-pay | Admitting: *Deleted

## 2016-01-03 DIAGNOSIS — I5030 Unspecified diastolic (congestive) heart failure: Secondary | ICD-10-CM

## 2016-01-03 DIAGNOSIS — J45901 Unspecified asthma with (acute) exacerbation: Secondary | ICD-10-CM | POA: Diagnosis present

## 2016-01-03 DIAGNOSIS — F1210 Cannabis abuse, uncomplicated: Secondary | ICD-10-CM | POA: Diagnosis present

## 2016-01-03 DIAGNOSIS — I5021 Acute systolic (congestive) heart failure: Secondary | ICD-10-CM | POA: Diagnosis present

## 2016-01-03 DIAGNOSIS — R609 Edema, unspecified: Secondary | ICD-10-CM

## 2016-01-03 DIAGNOSIS — R0989 Other specified symptoms and signs involving the circulatory and respiratory systems: Secondary | ICD-10-CM | POA: Diagnosis present

## 2016-01-03 DIAGNOSIS — Z720 Tobacco use: Secondary | ICD-10-CM | POA: Diagnosis present

## 2016-01-03 DIAGNOSIS — J45909 Unspecified asthma, uncomplicated: Secondary | ICD-10-CM | POA: Diagnosis present

## 2016-01-03 DIAGNOSIS — I2699 Other pulmonary embolism without acute cor pulmonale: Secondary | ICD-10-CM

## 2016-01-03 DIAGNOSIS — I272 Other secondary pulmonary hypertension: Secondary | ICD-10-CM | POA: Diagnosis present

## 2016-01-03 DIAGNOSIS — Z79899 Other long term (current) drug therapy: Secondary | ICD-10-CM

## 2016-01-03 DIAGNOSIS — N289 Disorder of kidney and ureter, unspecified: Secondary | ICD-10-CM | POA: Diagnosis present

## 2016-01-03 DIAGNOSIS — Z885 Allergy status to narcotic agent status: Secondary | ICD-10-CM

## 2016-01-03 DIAGNOSIS — R000 Tachycardia, unspecified: Secondary | ICD-10-CM | POA: Diagnosis present

## 2016-01-03 DIAGNOSIS — I420 Dilated cardiomyopathy: Principal | ICD-10-CM | POA: Diagnosis present

## 2016-01-03 DIAGNOSIS — R0781 Pleurodynia: Secondary | ICD-10-CM | POA: Diagnosis present

## 2016-01-03 DIAGNOSIS — R7989 Other specified abnormal findings of blood chemistry: Secondary | ICD-10-CM | POA: Diagnosis present

## 2016-01-03 DIAGNOSIS — R071 Chest pain on breathing: Secondary | ICD-10-CM

## 2016-01-03 DIAGNOSIS — X58XXXA Exposure to other specified factors, initial encounter: Secondary | ICD-10-CM | POA: Diagnosis present

## 2016-01-03 DIAGNOSIS — R079 Chest pain, unspecified: Secondary | ICD-10-CM | POA: Insufficient documentation

## 2016-01-03 DIAGNOSIS — F17210 Nicotine dependence, cigarettes, uncomplicated: Secondary | ICD-10-CM | POA: Diagnosis present

## 2016-01-03 DIAGNOSIS — M7989 Other specified soft tissue disorders: Secondary | ICD-10-CM | POA: Diagnosis present

## 2016-01-03 DIAGNOSIS — Z880 Allergy status to penicillin: Secondary | ICD-10-CM

## 2016-01-03 DIAGNOSIS — F1410 Cocaine abuse, uncomplicated: Secondary | ICD-10-CM | POA: Diagnosis present

## 2016-01-03 DIAGNOSIS — D649 Anemia, unspecified: Secondary | ICD-10-CM | POA: Diagnosis present

## 2016-01-03 DIAGNOSIS — I509 Heart failure, unspecified: Secondary | ICD-10-CM

## 2016-01-03 DIAGNOSIS — S2231XD Fracture of one rib, right side, subsequent encounter for fracture with routine healing: Secondary | ICD-10-CM

## 2016-01-03 DIAGNOSIS — S2231XA Fracture of one rib, right side, initial encounter for closed fracture: Secondary | ICD-10-CM | POA: Diagnosis present

## 2016-01-03 DIAGNOSIS — R778 Other specified abnormalities of plasma proteins: Secondary | ICD-10-CM | POA: Diagnosis present

## 2016-01-03 DIAGNOSIS — R0602 Shortness of breath: Secondary | ICD-10-CM

## 2016-01-03 DIAGNOSIS — I248 Other forms of acute ischemic heart disease: Secondary | ICD-10-CM | POA: Diagnosis present

## 2016-01-03 DIAGNOSIS — J441 Chronic obstructive pulmonary disease with (acute) exacerbation: Secondary | ICD-10-CM | POA: Diagnosis present

## 2016-01-03 LAB — LIPID PANEL
CHOL/HDL RATIO: 4 ratio
CHOLESTEROL: 120 mg/dL (ref 0–200)
HDL: 30 mg/dL — ABNORMAL LOW (ref >40)
LDL CALC: 66 mg/dL (ref 0–99)
TRIGLYCERIDES: 118 mg/dL (ref <150)
VLDL: 24 mg/dL (ref 0–40)

## 2016-01-03 LAB — ECHOCARDIOGRAM COMPLETE
Height: 69 in
Weight: 3164.04 oz

## 2016-01-03 LAB — BASIC METABOLIC PANEL
ANION GAP: 11 (ref 5–15)
BUN: 20 mg/dL (ref 6–20)
CO2: 22 mmol/L (ref 22–32)
CREATININE: 1.39 mg/dL — AB (ref 0.61–1.24)
Calcium: 9.1 mg/dL (ref 8.9–10.3)
Chloride: 107 mmol/L (ref 101–111)
GFR calc non Af Amer: >60 mL/min (ref >60)
Glucose, Bld: 122 mg/dL — ABNORMAL HIGH (ref 65–99)
POTASSIUM: 4.3 mmol/L (ref 3.5–5.1)
SODIUM: 140 mmol/L (ref 135–145)

## 2016-01-03 LAB — CBC
HEMATOCRIT: 40 % (ref 39.0–52.0)
Hemoglobin: 12.8 g/dL — ABNORMAL LOW (ref 13.0–17.0)
MCH: 25.7 pg — AB (ref 26.0–34.0)
MCHC: 32 g/dL (ref 30.0–36.0)
MCV: 80.3 fL (ref 78.0–100.0)
PLATELETS: 214 10*3/uL (ref 150–400)
RBC: 4.98 MIL/uL (ref 4.22–5.81)
RDW: 15.4 % (ref 11.5–15.5)
WBC: 8.1 10*3/uL (ref 4.0–10.5)

## 2016-01-03 LAB — COMPREHENSIVE METABOLIC PANEL
ALT: 149 U/L — ABNORMAL HIGH (ref 17–63)
AST: 94 U/L — AB (ref 15–41)
Albumin: 2.9 g/dL — ABNORMAL LOW (ref 3.5–5.0)
Alkaline Phosphatase: 122 U/L (ref 38–126)
Anion gap: 9 (ref 5–15)
BUN: 15 mg/dL (ref 6–20)
CHLORIDE: 103 mmol/L (ref 101–111)
CO2: 25 mmol/L (ref 22–32)
CREATININE: 1.45 mg/dL — AB (ref 0.61–1.24)
Calcium: 8.6 mg/dL — ABNORMAL LOW (ref 8.9–10.3)
GFR calc Af Amer: >60 mL/min (ref >60)
GFR, EST NON AFRICAN AMERICAN: 59 mL/min — AB (ref >60)
Glucose, Bld: 119 mg/dL — ABNORMAL HIGH (ref 65–99)
POTASSIUM: 3.8 mmol/L (ref 3.5–5.1)
SODIUM: 137 mmol/L (ref 135–145)
Total Bilirubin: 0.6 mg/dL (ref 0.3–1.2)
Total Protein: 5.7 g/dL — ABNORMAL LOW (ref 6.5–8.1)

## 2016-01-03 LAB — BRAIN NATRIURETIC PEPTIDE: B Natriuretic Peptide: 1315.7 pg/mL — ABNORMAL HIGH (ref 0.0–100.0)

## 2016-01-03 LAB — RAPID URINE DRUG SCREEN, HOSP PERFORMED
Amphetamines: NOT DETECTED
BARBITURATES: NOT DETECTED
BENZODIAZEPINES: NOT DETECTED
COCAINE: POSITIVE — AB
OPIATES: POSITIVE — AB
TETRAHYDROCANNABINOL: POSITIVE — AB

## 2016-01-03 LAB — TROPONIN I
TROPONIN I: 0.5 ng/mL — AB (ref <0.031)
Troponin I: 0.54 ng/mL (ref <0.031)
Troponin I: 0.49 ng/mL — ABNORMAL HIGH (ref <0.031)
Troponin I: 0.3 ng/mL — ABNORMAL HIGH (ref <0.031)

## 2016-01-03 LAB — D-DIMER, QUANTITATIVE (NOT AT ARMC): D DIMER QUANT: 2.01 ug{FEU}/mL — AB (ref 0.00–0.50)

## 2016-01-03 LAB — TSH: TSH: 1.425 u[IU]/mL (ref 0.350–4.500)

## 2016-01-03 LAB — GLUCOSE, CAPILLARY: GLUCOSE-CAPILLARY: 180 mg/dL — AB (ref 65–99)

## 2016-01-03 MED ORDER — FUROSEMIDE 10 MG/ML IJ SOLN
40.0000 mg | Freq: Once | INTRAMUSCULAR | Status: AC
  Administered 2016-01-03: 40 mg via INTRAVENOUS
  Filled 2016-01-03: qty 4

## 2016-01-03 MED ORDER — NITROGLYCERIN 0.4 MG SL SUBL
0.4000 mg | SUBLINGUAL_TABLET | SUBLINGUAL | Status: DC | PRN
  Filled 2016-01-03: qty 1

## 2016-01-03 MED ORDER — MORPHINE SULFATE (PF) 4 MG/ML IV SOLN
4.0000 mg | Freq: Once | INTRAVENOUS | Status: AC
  Administered 2016-01-03: 4 mg via INTRAVENOUS
  Filled 2016-01-03: qty 1

## 2016-01-03 MED ORDER — ASPIRIN 81 MG PO CHEW
324.0000 mg | CHEWABLE_TABLET | Freq: Once | ORAL | Status: AC
  Administered 2016-01-03: 324 mg via ORAL
  Filled 2016-01-03: qty 4

## 2016-01-03 MED ORDER — GUAIFENESIN-DM 100-10 MG/5ML PO SYRP
5.0000 mL | ORAL_SOLUTION | ORAL | Status: DC | PRN
  Administered 2016-01-03 – 2016-01-04 (×2): 5 mL via ORAL
  Filled 2016-01-03 (×2): qty 5

## 2016-01-03 MED ORDER — IPRATROPIUM BROMIDE 0.02 % IN SOLN
0.5000 mg | Freq: Once | RESPIRATORY_TRACT | Status: AC
  Administered 2016-01-03: 0.5 mg via RESPIRATORY_TRACT
  Filled 2016-01-03: qty 2.5

## 2016-01-03 MED ORDER — SODIUM CHLORIDE 0.9% FLUSH
3.0000 mL | Freq: Two times a day (BID) | INTRAVENOUS | Status: DC
  Administered 2016-01-03 – 2016-01-05 (×4): 3 mL via INTRAVENOUS

## 2016-01-03 MED ORDER — ENOXAPARIN SODIUM 40 MG/0.4ML ~~LOC~~ SOLN
40.0000 mg | SUBCUTANEOUS | Status: DC
  Administered 2016-01-04 – 2016-01-05 (×2): 40 mg via SUBCUTANEOUS
  Filled 2016-01-03 (×2): qty 0.4

## 2016-01-03 MED ORDER — ONDANSETRON HCL 4 MG/2ML IJ SOLN: 4.0000 mg | Freq: Four times a day (QID) | INTRAMUSCULAR | Status: DC | PRN

## 2016-01-03 MED ORDER — SODIUM CHLORIDE 0.9 % IV SOLN: 250.0000 mL | INTRAVENOUS | Status: DC | PRN

## 2016-01-03 MED ORDER — ENOXAPARIN SODIUM 100 MG/ML ~~LOC~~ SOLN
1.0000 mg/kg | Freq: Two times a day (BID) | SUBCUTANEOUS | Status: DC
  Administered 2016-01-03: 90 mg via SUBCUTANEOUS
  Filled 2016-01-03 (×2): qty 1

## 2016-01-03 MED ORDER — TECHNETIUM TO 99M ALBUMIN AGGREGATED
4.3000 | Freq: Once | INTRAVENOUS | Status: AC | PRN
  Administered 2016-01-03: 4 via INTRAVENOUS

## 2016-01-03 MED ORDER — ALBUTEROL SULFATE (2.5 MG/3ML) 0.083% IN NEBU
5.0000 mg | INHALATION_SOLUTION | Freq: Once | RESPIRATORY_TRACT | Status: AC
  Administered 2016-01-03: 5 mg via RESPIRATORY_TRACT
  Filled 2016-01-03: qty 6

## 2016-01-03 MED ORDER — TECHNETIUM TC 99M DIETHYLENETRIAME-PENTAACETIC ACID: 30.3000 | Freq: Once | INTRAVENOUS | Status: DC | PRN

## 2016-01-03 MED ORDER — ACETAMINOPHEN 325 MG PO TABS: 650.0000 mg | ORAL_TABLET | ORAL | Status: DC | PRN

## 2016-01-03 MED ORDER — IOPAMIDOL (ISOVUE-370) INJECTION 76%
INTRAVENOUS | Status: AC
  Administered 2016-01-03: 100 mL
  Filled 2016-01-03: qty 100

## 2016-01-03 MED ORDER — IPRATROPIUM-ALBUTEROL 0.5-2.5 (3) MG/3ML IN SOLN
3.0000 mL | RESPIRATORY_TRACT | Status: DC | PRN
  Administered 2016-01-03 – 2016-01-04 (×3): 3 mL via RESPIRATORY_TRACT
  Filled 2016-01-03 (×4): qty 3

## 2016-01-03 MED ORDER — FENTANYL CITRATE (PF) 100 MCG/2ML IJ SOLN
25.0000 ug | INTRAMUSCULAR | Status: DC | PRN
  Administered 2016-01-03 – 2016-01-04 (×2): 25 ug via INTRAVENOUS
  Filled 2016-01-03 (×2): qty 2

## 2016-01-03 MED ORDER — SODIUM CHLORIDE 0.9% FLUSH: 3.0000 mL | INTRAVENOUS | Status: DC | PRN

## 2016-01-03 NOTE — Progress Notes (Signed)
Echocardiogram 2D Echocardiogram has been performed.  Mark [name] 01/03/2016, 1:54 PM

## 2016-01-03 NOTE — H&P (Signed)
History and Physical    Mark [name] JWJ:[id number] DOB: 1976-05-03 DOA: 01/03/2016  PCP: No PCP Per Patient   Patient coming from: Home  Chief Complaint: Chest pain, LE swelling  HPI: Mark [name] is a 40 y.o. male with medical history significant for asthma and tobacco abuse, but otherwise enjoys good health, presenting to the emergency department with 2 months of right-sided chest pain, dyspnea, and acute swelling of the bilateral lower extremities. Patient has been seen in an outside emergency department for evaluation of his chest pain and was diagnosed with right anterior rib fractures. He had been taking NSAIDs with some relief before his symptoms worsened significantly over the past day. He reports dyspnea and palpitations with minimal exertion and severe right anterior chest pain with deep inspiration or cough. His albuterol MDI has helped only minimally and his cough is nonproductive. There has been no fevers or chills associated with this illness. He describes symmetric swelling of the bilateral feet and ankles occurring over the past day or so. He denies headaches, confusion, loss of coordination,or focal numbness or weakness. There is no abdominal pain, nausea, vomiting, or diarrhea.  He denies any change in his urination, dysuria, or increased urinary urgency or frequency. There has been no recent long distance travel or prolonged immobilization.  ED Course: Upon arrival to the ED, patient is found to be afebrile, saturating adequately on room air, tachypneic, tachycardic in the 110s, and with adequate blood pressure. EKG features a sinus tachycardia with rate of 115, frequent PVCs, and nonspecific T-wave changes.  Chest x-ray is negative for acute cardiopulmonary disease, troponin is elevated to 0.54, and BNP is elevated to 1316. BMP features a serum creatinine 1.39 with no prior available for comparison. CBC features a hemoglobin of 12.8 with normal MCV. There was concern for PE and  a CTA PE study was performed. CT demonstrates emphysematous lung changes and cardiomegaly, right anterior rib fractures, and  Filling defect in subsegmental right lower lobe pulmonary arteries consistent with suboptimal opacification, or subsegmental PE. EDP discussed the case with cardiology who felt that this is most likely an acute PE. EDP was advised to obtain VQ scan treat empirically with Lovenox, admit to medicine service, and reconsult cardiology if the VQ scan is negative.  Review of Systems:  All other systems reviewed and apart from HPI, are negative.  Past Medical History  Diagnosis Date  . Asthma     History reviewed. No pertinent past surgical history.   reports that he has been smoking.  He does not have any smokeless tobacco history on file. He reports that he does not drink alcohol. His drug history is not on file.  Allergies  Allergen Reactions  . Hydrocodone Hives  . Penicillins Hives and Swelling    Has patient had a PCN reaction causing immediate rash, facial/tongue/throat swelling, SOB or lightheadedness with hypotension: no  Has patient had a PCN reaction causing severe rash involving mucus membranes or skin necrosis: no Has patient had a PCN reaction that required hospitalization pt was in the hospital when it happened Has patient had a PCN reaction occurring within the last 10 years: no If all of the above answers are "NO", then may proceed with Cephalosporin use.     Family History  Problem Relation Age of Onset  . Deep vein thrombosis Neg Hx      Prior to Admission medications   Medication Sig Start Date End Date Taking? Authorizing Provider  albuterol (PROVENTIL HFA;VENTOLIN HFA)  108 (90 BASE) MCG/ACT inhaler Inhale 1-2 puffs into the lungs every 6 (six) hours as needed for wheezing or shortness of breath.   Yes Historical Provider, MD  naproxen sodium (ANAPROX) 220 MG tablet Take 440 mg by mouth 2 (two) times daily as needed (pain).   Yes Historical  Provider, MD    Physical Exam: Filed Vitals:   01/03/16 0329 01/03/16 0400 01/03/16 0453 01/03/16 0501  BP:  112/88 119/90   Pulse: 106 55 68 57  Temp:      TempSrc:      Resp: 25 25 16 18   Height:      Weight:      SpO2: 95% 97% 98% 96%      Constitutional: NAD, calm, in apparent discomfort  Eyes: PERTLA, lids and conjunctivae normal ENMT: Mucous membranes are moist. Posterior pharynx clear of any exudate or lesions.   Neck: normal, supple, no masses, no thyromegaly Respiratory: Breath sounds markedly diminished b/l. Normal respiratory effort. No accessory muscle use.  Cardiovascular: Rate ~120 and regular without significant murmur. 1+ pedal edema b/l. 2+ pedal pulses.   Abdomen: No distension, no tenderness, no masses palpated. Bowel sounds normal.  Musculoskeletal: no clubbing / cyanosis. No joint deformity upper and lower extremities. Normal muscle tone.  Skin: no significant rashes, lesions, ulcers. Warm, dry, well-perfused. Neurologic: CN 2-12 grossly intact. Sensation intact, DTR normal. Strength 5/5 in all 4 limbs.  Psychiatric: Normal judgment and insight. Alert and oriented x 3. Normal mood and affect.     Labs on Admission: I have personally reviewed following labs and imaging studies  CBC:  Recent Labs Lab 01/03/16 0049  WBC 8.1  HGB 12.8*  HCT 40.0  MCV 80.3  PLT 214   Basic Metabolic Panel:  Recent Labs Lab 01/03/16 0049  NA 140  K 4.3  CL 107  CO2 22  GLUCOSE 122*  BUN 20  CREATININE 1.39*  CALCIUM 9.1   GFR: Estimated Creatinine Clearance: 77 mL/min (by C-G formula based on Cr of 1.39). Liver Function Tests: No results for input(s): AST, ALT, ALKPHOS, BILITOT, PROT, ALBUMIN in the last 168 hours. No results for input(s): LIPASE, AMYLASE in the last 168 hours. No results for input(s): AMMONIA in the last 168 hours. Coagulation Profile: No results for input(s): INR, PROTIME in the last 168 hours. Cardiac Enzymes:  Recent Labs Lab  01/03/16 0049 01/03/16 0350  TROPONINI 0.54* 0.50*   BNP (last 3 results) No results for input(s): PROBNP in the last 8760 hours. HbA1C: No results for input(s): HGBA1C in the last 72 hours. CBG: No results for input(s): GLUCAP in the last 168 hours. Lipid Profile: No results for input(s): CHOL, HDL, LDLCALC, TRIG, CHOLHDL, LDLDIRECT in the last 72 hours. Thyroid Function Tests: No results for input(s): TSH, T4TOTAL, FREET4, T3FREE, THYROIDAB in the last 72 hours. Anemia Panel: No results for input(s): VITAMINB12, FOLATE, FERRITIN, TIBC, IRON, RETICCTPCT in the last 72 hours. Urine analysis: No results found for: COLORURINE, APPEARANCEUR, LABSPEC, PHURINE, GLUCOSEU, HGBUR, BILIRUBINUR, KETONESUR, PROTEINUR, UROBILINOGEN, NITRITE, LEUKOCYTESUR Sepsis Labs: @LABRCNTIP (procalcitonin:4,lacticidven:4) )No results found for this or any previous visit (from the past 240 hour(s)).   Radiological Exams on Admission: Dg Chest 2 View  01/03/2016  CLINICAL DATA:  Right-sided chest pain and dyspnea, onset today. EXAM: CHEST  2 VIEW COMPARISON:  06/15/2015 FINDINGS: There is mild chronic appearing interstitial coarsening. There is no airspace opacity. There is no effusion. The pulmonary vasculature is normal. Hilar and mediastinal contours are unremarkable and unchanged.  IMPRESSION: No acute cardiopulmonary findings. Electronically Signed   By: Ellery Plunk M.D.   On: 01/03/2016 01:53   Ct Angio Chest Pe W/cm &/or Wo Cm  01/03/2016  CLINICAL DATA:  40 year old male with right-sided chest pain and shortness of breath EXAM: CT ANGIOGRAPHY CHEST WITH CONTRAST TECHNIQUE: Multidetector CT imaging of the chest was performed using the standard protocol during bolus administration of intravenous contrast. Multiplanar CT image reconstructions and MIPs were obtained to evaluate the vascular anatomy. CONTRAST:  100 cc Isovue 370 COMPARISON:  None. FINDINGS: There is centrilobular emphysema with areas of air  trapping. There is no focal consolidation. No pleural effusion or pneumothorax. The central airways are patent. The thoracic aorta is grossly unremarkable. There is an area of hypo enhancement in the subsegmental branches of the right lower lobe pulmonary artery (series 6 images 445- 485). This likely represents suboptimal opacification of the pulmonary artery branches. Pulmonary embolus is less likely. Clinical correlation is recommended. V/Q scan may provide additional information there is high clinical concern for pulmonary embolism. The remainder of the pulmonary arteries appear patent. There is moderate cardiomegaly with dilatation of the left ventricle and right atrium. Correlation with echocardiogram recommended. There is no pericardial effusion. No hilar or mediastinal adenopathy. The esophagus is grossly unremarkable. There is no thyroid nodule. There is no axillary adenopathy. The chest wall soft tissues appear unremarkable. There are fractures of the right anterior 4-7 ribs. Scroll the visualized upper abdomen is unremarkable. Review of the MIP images confirms the above findings. IMPRESSION: Suboptimal opacification due to timing of the contrast versus less likely pulmonary embolus involving the subsegmental branches of the right lower lobe. Clinical correlation is recommended. V/Q scan may provide better evaluation If there is high clinical concern for PE. Right anterior fourth-seventh rib fractures.  No pneumothorax. Emphysema with areas of air trapping.  No focal consolidation. Cardiomegaly. Electronically Signed   By: Elgie Collard M.D.   On: 01/03/2016 03:41    EKG: Independently reviewed. Sinus tachycardia (rate 115), frequent PVCs, non-specific T-wave abnormality   Assessment/Plan  1. Chest pain, right sided   - Suspected multifactorial with contributions from rib fractures and likely PE  - CTA PE study not optimal, but features cardiomegaly, emphysematous lung changes, and possible RLL  subsegmental PE  - Suspect the elevated troponin is secondary to PE; cardiology consulted from ED and asks for call back if V/Q scan is negative  - Treatment-dose Lovenox initiated in ED with pharmacy consultation, will continue  - Check V/Q scan, ordered  - Check TTE, ordered  - Bilateral LE dopplers ordered   2. Acute CHF  - There is b/l pedal edema and BNP is 1316 on admission  - ?secondary to acute PE?  - Lasix 40 mg IV given in ED  - Follow strict I/Os, daily wts, fluid-restrict diet  - Follow-up TTE  3. Tachycardia   - HR ~100 at rest, increases with minimal exertion  - Suspecting PE, and treating for such - Monitor on telemetry  - Follow-up V/Q scan   4. Asthma - Managed with albuterol prn only at home  - With daily smoking and emphysematous changes on CT, COPD is likely  - DuoNeb q4h prn bronchospasm   5. Kidney disease of unknown chronicity  - SCr is 1.39 on admission with BUN 20  - No prior available for comparison  - May worsen initially with Lasix given in ED, and with IV contrast exposure  - Trend renal fxn, follow fluid-status  closely    DVT prophylaxis: Treatment-dose Lovenox  Code Status: Full   Family Communication: Wife, by phone  Disposition Plan: Observe on telemetry   Consults called: Cardiology   Admission status: Observation    Briscoe Deutscher, MD [facility] Pager [phone]  If 7PM-7AM, please contact night-coverage [email or web address] Password [email or web address]  01/03/2016, 5:22 AM

## 2016-01-03 NOTE — ED Notes (Signed)
The pt is ahving rt sided chest pain for 2 months he has been going back and forth to [place] ed  He was diagnosed with 2 rib fractures on the rt side of his chest  He has asthma and it hurts to cough  His feet anf legs are swollen also

## 2016-01-03 NOTE — Progress Notes (Addendum)
Patient seen and examined  40 y.o. male with medical history significant for asthma and tobacco abuse, but otherwise enjoys good health, presenting to the emergency department with 2 months of right-sided chest pain, dyspnea, and acute swelling of the bilateral lower extremities. Patient has been seen in an outside emergency department for evaluation of his chest pain and was diagnosed with right anterior rib fractures. CT chest suggestive of subsegmental PE. VQ scan recommended  Assessment and plan  1. Chest pain, right sided  - Suspected multifactorial secondary to rib fractures , no pulmonary embolism - CTA PE study not optimal, but features cardiomegaly, emphysematous lung changes, and possible RLL subsegmental PE - Suspect the elevated troponin is secondary to PE; cardiology consulted from ED and asks for call back if V/Q scan is negative , VQ scan negative,  will continue Lovenox due to abnormal troponin, transitioned to DVT prophylaxis dose if 2-D echo within normal limits and troponin is not trending up and venous Doppler negative Venous Doppler pending 2-D echo pending   2. Acute CHF  - There is b/l pedal edema and BNP is 1316 on admission  - ?secondary to acute PE?  - Lasix 40 mg IV given in ED  - Follow strict I/Os, daily wts, fluid-restrict diet  -Follow 2-D echo for wall motion abnormalities, suspect patient has cardiomyopathy in the setting of abnormal troponin  3. Tachycardia suspect demand-induced ischemia with abnormal troponin - HR ~100 at rest, increases with minimal exertion  Start metoprolol IV prn for heart rate greater than 100  4. Asthma without exacerbation - Managed with albuterol prn only at home  - With daily smoking and emphysematous changes on CT, COPD is likely  - DuoNeb q4h prn bronchospasm   5. Kidney disease of unknown chronicity  - SCr is 1.39 on admission with BUN 20  - No prior available for comparison  - May worsen initially  with Lasix given in ED, and with IV contrast exposure  - Trend renal fxn, follow fluid-status closely

## 2016-01-03 NOTE — Progress Notes (Signed)
ANTICOAGULATION CONSULT NOTE - Initial Consult  Pharmacy Consult for Lovenox  Indication: Concern for PE  Patient Measurements: Height: 5\' 9"  (175.3 cm) Weight: 195 lb (88.451 kg) IBW/kg (Calculated) : 70.7  Vital Signs: Temp: 97.8 F (36.6 C) (05/14 0035) Temp Source: Oral (05/14 0035) BP: 105/85 mmHg (05/14 0328) Pulse Rate: 106 (05/14 0329)  Labs:  Recent Labs  01/03/16 0049  HGB 12.8*  HCT 40.0  PLT 214  CREATININE 1.39*  TROPONINI 0.54*    Estimated Creatinine Clearance: 77 mL/min (by C-G formula based on Cr of 1.39).   Medical History: Past Medical History  Diagnosis Date  . Asthma    Assessment: CT Angio unable to rule out PE, getting VQ for better clarification, starting Lovenox in the meantime, Hgb 12.8, mild bump in Scr, other labs reviewed.   Goal of Therapy:  Monitor platelets by anticoagulation protocol: Yes   Plan:  -Lovenox 90 mg subcutaneous q12h -Minimum q72h while inpatient  -Monitor for bleeding -F/U results of VQ scan  Abran Duke 01/03/2016,4:08 AM

## 2016-01-03 NOTE — ED Notes (Signed)
EDP aware pt troponin 0.54

## 2016-01-03 NOTE — ED Notes (Signed)
Admitting at bedside 

## 2016-01-03 NOTE — ED Notes (Signed)
Dr. Ward at bedside.

## 2016-01-03 NOTE — Progress Notes (Signed)
VASCULAR LAB PRELIMINARY  PRELIMINARY  PRELIMINARY  PRELIMINARY  Bilateral lower extremity venous duplex  completed.    Preliminary report:  Bilateral:  No evidence of DVT, superficial thrombosis, or Baker's Cyst.    Shaniah Baltes, RVT 01/03/2016, 12:35 PM

## 2016-01-03 NOTE — Progress Notes (Signed)
Patient admitted through ed  Into 2w12 for observation. On arrival, alert and oriented and ambulatory. Vitals checked and recorded. Placed on cardiac monitor and safety measures put in placed. Oriented to the room and call bell placed within reach.

## 2016-01-03 NOTE — ED Provider Notes (Signed)
TIME SEEN: 1:40 AM  CHIEF COMPLAINT: Chest pain, shortness of breath  HPI: Pt is a 40 y.o. male with reported history of asthma who presents to the emergency department with 2 months of intermittent episodes of chest pain. Describes it as a tightness, pressure as well as a sharp right-sided pain. States he thinks the right-sided pain is from rib fractures from coughing. States he does have significant dyspnea with exertion that has progressively worsened. 2 days ago he had diaphoresis. No nausea, vomiting, dizziness. States he has been treated intermittently for asthma as he has had some dry cough and wheezing. Tonight he presented to the emergency department because he noticed bilateral lower extremity swelling that is new for him. No known history of CAD, CHF, PE or DVT. He has never had a stress test or cardiac catheterization. He does not have a primary care physician. He is a smoker.  ROS: See HPI Constitutional: no fever  Eyes: no drainage  ENT: no runny nose   Cardiovascular:   chest pain  Resp:  SOB  GI: no vomiting GU: no dysuria Integumentary: no rash  Allergy: no hives  Musculoskeletal: no leg swelling  Neurological: no slurred speech ROS otherwise negative  PAST MEDICAL HISTORY/PAST SURGICAL HISTORY:  Past Medical History  Diagnosis Date  . Asthma     MEDICATIONS:  Prior to Admission medications   Medication Sig Start Date End Date Taking? Authorizing Provider  albuterol (PROVENTIL HFA;VENTOLIN HFA) 108 (90 BASE) MCG/ACT inhaler Inhale 1-2 puffs into the lungs every 6 (six) hours as needed for wheezing or shortness of breath.    Historical Provider, MD  naproxen sodium (ANAPROX) 220 MG tablet Take 440 mg by mouth 2 (two) times daily as needed (pain).    Historical Provider, MD  predniSONE (DELTASONE) 20 MG tablet 3 tabs po day one, then 2 tabs daily x 4 days 09/17/15   Fayrene Helper, PA-C  predniSONE (DELTASONE) 50 MG tablet Take one tablet once a day for 5 days 11/29/15   Rolm Gala  Barrett, PA-C    ALLERGIES:  Allergies  Allergen Reactions  . Hydrocodone Hives  . Penicillins Hives and Swelling    Has patient had a PCN reaction causing immediate rash, facial/tongue/throat swelling, SOB or lightheadedness with hypotension: no  Has patient had a PCN reaction causing severe rash involving mucus membranes or skin necrosis: no Has patient had a PCN reaction that required hospitalization pt was in the hospital when it happened Has patient had a PCN reaction occurring within the last 10 years: no If all of the above answers are "NO", then may proceed with Cephalosporin use.     SOCIAL HISTORY:  Social History  Substance Use Topics  . Smoking status: Current Every Day Smoker  . Smokeless tobacco: Not on file  . Alcohol Use: No    FAMILY HISTORY: No family history on file.  EXAM: BP 128/101 mmHg  Pulse 119  Temp(Src) 97.8 F (36.6 C) (Oral)  Resp 26  Ht 5\' 9"  (1.753 m)  Wt 195 lb (88.451 kg)  BMI 28.78 kg/m2  SpO2 97% CONSTITUTIONAL: Alert and oriented and responds appropriately to questions. Well-appearing; well-nourished HEAD: Normocephalic EYES: Conjunctivae clear, PERRL ENT: normal nose; no rhinorrhea; moist mucous membranes NECK: Supple, no meningismus, no LAD  CARD: RRR; S1 and S2 appreciated; no murmurs, no clicks, no rubs, no gallops RESP: Normal chest excursion without splinting; patient does become tachypneic with minimal movement in the bed, diminished at his bases bilaterally, breath sounds clear  and equal bilaterally; no wheezes, no rhonchi, no rales, no hypoxia or respiratory distress, speaking full sentences ABD/GI: Normal bowel sounds; non-distended; soft, non-tender, no rebound, no guarding, no peritoneal signs BACK:  The back appears normal and is non-tender to palpation, there is no CVA tenderness EXT: Normal ROM in all joints; non-tender to palpation; no edema; normal capillary refill; no cyanosis, no calf tenderness or swelling    SKIN:  Normal color for age and race; warm; no rash NEURO: Moves all extremities equally, sensation to light touch intact diffusely, cranial nerves II through XII intact PSYCH: The patient's mood and manner are appropriate. Grooming and personal hygiene are appropriate.  MEDICAL DECISION MAKING: Patient here with concerning story for chest pain. He is hypertensive and tachycardic. Complains of dyspnea on exertion. Labs ordered in triage are positive troponin of 0.54. We'll give aspirin, nitroglycerin for pain and reassess. He does appear to be very short of breath with minimal exertion on exam. He is tachycardic but not hypoxic. He is slightly tachypneic. Concern as well for possible pulmonary embolus. We'll obtain a CT of patient's chest. Chest x-ray today shows no infiltrate, edema, pneumothorax. Patient does have some bilateral lower extremity swelling on exam. BNP is pending. Does not appear significantly volume overloaded. EKG shows no new ischemic changes. He does have nonspecific T-wave abnormalities that are unchanged compared to prior EKG. I feel patient will need admission. He does not have a primary care provider.  ED PROGRESS: Patient's BNP is elevated. Will give IV Lasix. CT scan shows sub-optimal opacification due to timing of contrast versus pulmonary embolus in the subsegmental branches of the right lower lobe. Because we cannot rule out PE, I do feel he will need a VQ scan. We'll give him a dose of Lovenox in the emergency department. He does have right anterior fourth through seventh rib fractures without pneumothorax. No focal consolidation seen on CT scan. We'll discuss with cardiology because of his positive troponin. He is no longer having any tightness in his chest. Only complaining of right-sided sharp pain with movement, palpation and coughing which I think is secondary to his rib fractures.  4:00 AM  D/w Dr. Mayford Knife with cardiology. She agrees that this could be a possible pulmonary  embolus. Agrees with getting a VQ scan and giving therapeutic Lovenox. She states if the VQ scan is normal, she would like the medicine team to contact cardiology for consultation. Will admit to medicine. Updated patient and family.   4:30 Discussed patient's case with hospitalist, Dr. Antionette Char.  Recommend admission to telemetry, observation bed.  I will place holding orders per their request. Patient and family (if present) updated with plan. Care transferred to hospitalist service.  I reviewed all nursing notes, vitals, pertinent old records, EKGs, labs, imaging (as available).    EKG Interpretation  Date/Time:  Sunday Jan 03 2016 00:30:16 EDT Ventricular Rate:  115 PR Interval:  142 QRS Duration: 68 QT Interval:  324 QTC Calculation: 448 R Axis:   97 Text Interpretation:  Sinus tachycardia with frequent Premature ventricular complexes Rightward axis Anterior infarct , age undetermined Abnormal ECG No significant change since last tracing Confirmed by Aemon Koeller,  DO, Viraj Liby [phone]) on 01/03/2016 1:35:44 AM         Layla Maw Yetzali Weld, DO 01/03/16 6045

## 2016-01-04 ENCOUNTER — Observation Stay (HOSPITAL_COMMUNITY): Payer: Medicaid Other

## 2016-01-04 ENCOUNTER — Encounter (HOSPITAL_COMMUNITY): Payer: Self-pay | Admitting: General Practice

## 2016-01-04 DIAGNOSIS — J4521 Mild intermittent asthma with (acute) exacerbation: Secondary | ICD-10-CM

## 2016-01-04 DIAGNOSIS — F1210 Cannabis abuse, uncomplicated: Secondary | ICD-10-CM | POA: Diagnosis present

## 2016-01-04 DIAGNOSIS — I420 Dilated cardiomyopathy: Secondary | ICD-10-CM | POA: Diagnosis present

## 2016-01-04 DIAGNOSIS — I5021 Acute systolic (congestive) heart failure: Secondary | ICD-10-CM | POA: Diagnosis present

## 2016-01-04 DIAGNOSIS — R609 Edema, unspecified: Secondary | ICD-10-CM | POA: Diagnosis present

## 2016-01-04 DIAGNOSIS — Z79899 Other long term (current) drug therapy: Secondary | ICD-10-CM | POA: Diagnosis not present

## 2016-01-04 DIAGNOSIS — D649 Anemia, unspecified: Secondary | ICD-10-CM | POA: Diagnosis present

## 2016-01-04 DIAGNOSIS — S2231XA Fracture of one rib, right side, initial encounter for closed fracture: Secondary | ICD-10-CM | POA: Diagnosis present

## 2016-01-04 DIAGNOSIS — I272 Other secondary pulmonary hypertension: Secondary | ICD-10-CM | POA: Diagnosis present

## 2016-01-04 DIAGNOSIS — Z880 Allergy status to penicillin: Secondary | ICD-10-CM | POA: Diagnosis not present

## 2016-01-04 DIAGNOSIS — N289 Disorder of kidney and ureter, unspecified: Secondary | ICD-10-CM | POA: Diagnosis present

## 2016-01-04 DIAGNOSIS — F1410 Cocaine abuse, uncomplicated: Secondary | ICD-10-CM | POA: Diagnosis present

## 2016-01-04 DIAGNOSIS — J441 Chronic obstructive pulmonary disease with (acute) exacerbation: Secondary | ICD-10-CM | POA: Diagnosis present

## 2016-01-04 DIAGNOSIS — X58XXXA Exposure to other specified factors, initial encounter: Secondary | ICD-10-CM | POA: Diagnosis present

## 2016-01-04 DIAGNOSIS — F17210 Nicotine dependence, cigarettes, uncomplicated: Secondary | ICD-10-CM | POA: Diagnosis present

## 2016-01-04 DIAGNOSIS — J45901 Unspecified asthma with (acute) exacerbation: Secondary | ICD-10-CM | POA: Diagnosis present

## 2016-01-04 DIAGNOSIS — R778 Other specified abnormalities of plasma proteins: Secondary | ICD-10-CM | POA: Diagnosis present

## 2016-01-04 DIAGNOSIS — Z885 Allergy status to narcotic agent status: Secondary | ICD-10-CM | POA: Diagnosis not present

## 2016-01-04 DIAGNOSIS — I248 Other forms of acute ischemic heart disease: Secondary | ICD-10-CM | POA: Diagnosis present

## 2016-01-04 LAB — BASIC METABOLIC PANEL
Anion gap: 7 (ref 5–15)
BUN: 14 mg/dL (ref 6–20)
CHLORIDE: 104 mmol/L (ref 101–111)
CO2: 25 mmol/L (ref 22–32)
Calcium: 8.5 mg/dL — ABNORMAL LOW (ref 8.9–10.3)
Creatinine, Ser: 1.25 mg/dL — ABNORMAL HIGH (ref 0.61–1.24)
GFR calc Af Amer: >60 mL/min (ref >60)
GFR calc non Af Amer: >60 mL/min (ref >60)
GLUCOSE: 138 mg/dL — AB (ref 65–99)
POTASSIUM: 4.1 mmol/L (ref 3.5–5.1)
SODIUM: 136 mmol/L (ref 135–145)

## 2016-01-04 LAB — CBC
HEMATOCRIT: 37.4 % — AB (ref 39.0–52.0)
HEMOGLOBIN: 11.5 g/dL — AB (ref 13.0–17.0)
MCH: 24.6 pg — ABNORMAL LOW (ref 26.0–34.0)
MCHC: 30.7 g/dL (ref 30.0–36.0)
MCV: 79.9 fL (ref 78.0–100.0)
Platelets: 183 10*3/uL (ref 150–400)
RBC: 4.68 MIL/uL (ref 4.22–5.81)
RDW: 15 % (ref 11.5–15.5)
WBC: 5.7 10*3/uL (ref 4.0–10.5)

## 2016-01-04 LAB — HEMOGLOBIN A1C
Hgb A1c MFr Bld: 6.3 % — ABNORMAL HIGH (ref 4.8–5.6)
Mean Plasma Glucose: 134 mg/dL

## 2016-01-04 LAB — TROPONIN I: Troponin I: 0.42 ng/mL — ABNORMAL HIGH (ref <0.031)

## 2016-01-04 LAB — BLOOD GAS, ARTERIAL
Acid-base deficit: 0.3 mmol/L (ref 0.0–2.0)
Bicarbonate: 23.3 mEq/L (ref 20.0–24.0)
Drawn by: 103701
FIO2: 0.21
O2 Saturation: 95.2 %
PCO2 ART: 34 mmHg — AB (ref 35.0–45.0)
PH ART: 7.449 (ref 7.350–7.450)
Patient temperature: 98.6
TCO2: 24.3 mmol/L (ref 0–100)
pO2, Arterial: 78 mmHg — ABNORMAL LOW (ref 80.0–100.0)

## 2016-01-04 LAB — MAGNESIUM: Magnesium: 1.6 mg/dL — ABNORMAL LOW (ref 1.7–2.4)

## 2016-01-04 MED ORDER — METOPROLOL TARTRATE 5 MG/5ML IV SOLN: 5.0000 mg | INTRAVENOUS | Status: DC | PRN

## 2016-01-04 MED ORDER — METHYLPREDNISOLONE SODIUM SUCC 125 MG IJ SOLR
80.0000 mg | Freq: Two times a day (BID) | INTRAMUSCULAR | Status: DC
  Administered 2016-01-04 – 2016-01-06 (×4): 80 mg via INTRAVENOUS
  Filled 2016-01-04 (×4): qty 2

## 2016-01-04 MED ORDER — CARVEDILOL 3.125 MG PO TABS
3.1250 mg | ORAL_TABLET | Freq: Two times a day (BID) | ORAL | Status: DC
  Administered 2016-01-04 (×2): 3.125 mg via ORAL
  Filled 2016-01-04: qty 1

## 2016-01-04 MED ORDER — FUROSEMIDE 10 MG/ML IJ SOLN
60.0000 mg | Freq: Once | INTRAMUSCULAR | Status: AC
  Administered 2016-01-04: 60 mg via INTRAVENOUS
  Filled 2016-01-04: qty 6

## 2016-01-04 MED ORDER — LEVALBUTEROL HCL 1.25 MG/0.5ML IN NEBU
1.2500 mg | INHALATION_SOLUTION | Freq: Three times a day (TID) | RESPIRATORY_TRACT | Status: DC
  Administered 2016-01-04 – 2016-01-06 (×5): 1.25 mg via RESPIRATORY_TRACT
  Filled 2016-01-04 (×5): qty 0.5

## 2016-01-04 NOTE — Plan of Care (Signed)
Problem: Cardiac: Goal: Ability to achieve and maintain adequate cardiopulmonary perfusion will improve Outcome: Progressing Pt with stable vital signs. He had complaints of shortness of breath at rest throughout the shift. He was on 2 to 3L O2 via nasal cannula and his oxygen saturation were 99 to 100%. Pt was non compliant with keeping the nasal cannula on continuously as he stated it was not working. Pt was very restless and he had ongoing complaints of chest pain, which was not resolved with prn pain medications. Pt was transferred to stepdown unit at 1850 as he was not getting any relief of his symptoms.

## 2016-01-04 NOTE — Consult Note (Signed)
Cardiology Consult    Patient ID: VIKASH NEST MRN: [id number], DOB/AGE: 10/09/1975   Admit date: 01/03/2016 Date of Consult: 01/04/2016  Primary Physician: No PCP Per Patient Primary Cardiologist: New Requesting Provider: Dr. Susie Cassette  Patient Profile    40 yo male with PMH of asthma and cocaine abuse who presented to the [facility] ED with 2 months of right sided chest pain/dyspnea and LEE.   Past Medical History   Past Medical History  Diagnosis Date  . Asthma     Past Surgical History  Procedure Laterality Date  . None       Allergies  Allergies  Allergen Reactions  . Hydrocodone Hives  . Penicillins Hives and Swelling    Has patient had a PCN reaction causing immediate rash, facial/tongue/throat swelling, SOB or lightheadedness with hypotension: no  Has patient had a PCN reaction causing severe rash involving mucus membranes or skin necrosis: no Has patient had a PCN reaction that required hospitalization pt was in the hospital when it happened Has patient had a PCN reaction occurring within the last 10 years: no If all of the above answers are "NO", then may proceed with Cephalosporin use.     History of Present Illness    Mr Mckay is a 40 yo male with PMH of asthma/cocaine/marijuana abuse and smoking. Denies ever seeing a cardiologist in the past, or ever having had a cardiology work up in the past.   He states over the past 2 months he has had a gradual decline in his ability to perform his daily activities. He has also been having this intermittent episodes of right sided chest pain that is sharp in nature, seems to be present with activity. His wife reports that he is not in his normal state of health, has worsened to the point he can't even ambulate around his home without getting dyspneic.  He attributed this to his asthma hx and was seen recently in the ED and given an inhaler. States he has smoked for the past 20 years, along with using cocaine and marijuana  occasionally. He tried taking OTC NSAIDS for chest pain relief, but states this did not help.   Most recently he presented to the [facility] ED c/o this chest pain along with significant dyspnea and LEE. In the ED he was noted to be tachycardiac,, frequent PVCs with soft blood pressures. Chest x-ray showed no acute findings. He was worked up for PE with a CTA and VQ scan which showed low risk for VQ scan, along with moderate emphysematous disease. He did have an initial troponin of 0.54>>0.42. BNP on arrival was 1315, Cr elevated at 1.39, and D-dimer of 2.01. He was admitted through Internal Medicine service.   Cardiology has consulted related to new finding of decreased EF of 20-25%.   Inpatient Medications    . carvedilol  3.125 mg Oral BID WC  . enoxaparin (LOVENOX) injection  40 mg Subcutaneous Q24H  . levalbuterol  1.25 mg Nebulization Q8H  . methylPREDNISolone (SOLU-MEDROL) injection  80 mg Intravenous Q12H  . sodium chloride flush  3 mL Intravenous Q12H    Family History    Family History  Problem Relation Age of Onset  . Deep vein thrombosis Neg Hx   . Cardiomyopathy Father     Reports his father has an LVAD  . Heart failure Father   . Hypertension Father     Social History    Social History   Social History  .  Marital Status: Married    Spouse Name: N/A  . Number of Children: N/A  . Years of Education: N/A   Occupational History  . Not on file.   Social History Main Topics  . Smoking status: Current Every Day Smoker -- 0.50 packs/day for 20 years    Types: Cigarettes  . Smokeless tobacco: Never Used  . Alcohol Use: No  . Drug Use: Yes    Special: Cocaine, Marijuana  . Sexual Activity: Not on file   Other Topics Concern  . Not on file   Social History Narrative     Review of Systems    General:  No chills, fever, night sweats or weight changes.  Cardiovascular:  + chest pain, + dyspnea on exertion, + edema, orthopnea, palpitations, paroxysmal nocturnal  dyspnea. Dermatological: No rash, lesions/masses Respiratory: No cough, dyspnea Urologic: No hematuria, dysuria Abdominal:   No nausea, vomiting, diarrhea, bright red blood per rectum, melena, or hematemesis Neurologic:  No visual changes, wkns, changes in mental status. All other systems reviewed and are otherwise negative except as noted above.  Physical Exam    Blood pressure 124/75, pulse 110, temperature 98 F (36.7 C), temperature source Oral, resp. rate 18, height 5\' 9"  (1.753 m), weight 197 lb (89.359 kg), SpO2 100 %.  General: Young AA male. Noted respiratory distress.   Psych: Normal affect. Neuro: Alert and oriented X 3. Moves all extremities spontaneously. HEENT: Normal  Neck: Supple without bruits or JVD. Lungs:  Diminished throughout, expirattory wheezing. Heart: tachy no s3, s4, or murmurs. Abdomen: Soft, non-tender, non-distended, BS + x 4.  Extremities: No clubbing, cyanosis or 1+ edema. DP/PT/Radials 2+ and equal bilaterally.  Labs    Troponin [facility] Test)  Recent Labs  01/03/16 0350 01/03/16 1128 01/03/16 1638 01/03/16 2244  TROPONINI 0.50* 0.49* 0.30* 0.42*   Lab Results  Component Value Date   WBC 5.7 01/04/2016   HGB 11.5* 01/04/2016   HCT 37.4* 01/04/2016   MCV 79.9 01/04/2016   PLT 183 01/04/2016     Recent Labs Lab 01/03/16 1128 01/04/16 0255  NA 137 136  K 3.8 4.1  CL 103 104  CO2 25 25  BUN 15 14  CREATININE 1.45* 1.25*  CALCIUM 8.6* 8.5*  PROT 5.7*  --   BILITOT 0.6  --   ALKPHOS 122  --   ALT 149*  --   AST 94*  --   GLUCOSE 119* 138*   Lab Results  Component Value Date   CHOL 120 01/03/2016   HDL 30* 01/03/2016   LDLCALC 66 01/03/2016   TRIG 118 01/03/2016   Lab Results  Component Value Date   DDIMER 2.01* 01/02/2016     Radiology Studies    Dg Chest 2 View  01/03/2016  CLINICAL DATA:  Right-sided chest pain and dyspnea, onset today. EXAM: CHEST  2 VIEW COMPARISON:  06/15/2015 FINDINGS: There is mild  chronic appearing interstitial coarsening. There is no airspace opacity. There is no effusion. The pulmonary vasculature is normal. Hilar and mediastinal contours are unremarkable and unchanged. IMPRESSION: No acute cardiopulmonary findings. Electronically Signed   By: Ellery Plunk M.D.   On: 01/03/2016 01:53   Ct Angio Chest Pe W/cm &/or Wo Cm  01/03/2016  CLINICAL DATA:  40 year old male with right-sided chest pain and shortness of breath EXAM: CT ANGIOGRAPHY CHEST WITH CONTRAST TECHNIQUE: Multidetector CT imaging of the chest was performed using the standard protocol during bolus administration of intravenous contrast. Multiplanar CT image  reconstructions and MIPs were obtained to evaluate the vascular anatomy. CONTRAST:  100 cc Isovue 370 COMPARISON:  None. FINDINGS: There is centrilobular emphysema with areas of air trapping. There is no focal consolidation. No pleural effusion or pneumothorax. The central airways are patent. The thoracic aorta is grossly unremarkable. There is an area of hypo enhancement in the subsegmental branches of the right lower lobe pulmonary artery (series 6 images 445- 485). This likely represents suboptimal opacification of the pulmonary artery branches. Pulmonary embolus is less likely. Clinical correlation is recommended. V/Q scan may provide additional information there is high clinical concern for pulmonary embolism. The remainder of the pulmonary arteries appear patent. There is moderate cardiomegaly with dilatation of the left ventricle and right atrium. Correlation with echocardiogram recommended. There is no pericardial effusion. No hilar or mediastinal adenopathy. The esophagus is grossly unremarkable. There is no thyroid nodule. There is no axillary adenopathy. The chest wall soft tissues appear unremarkable. There are fractures of the right anterior 4-7 ribs. Scroll the visualized upper abdomen is unremarkable. Review of the MIP images confirms the above findings.  IMPRESSION: Suboptimal opacification due to timing of the contrast versus less likely pulmonary embolus involving the subsegmental branches of the right lower lobe. Clinical correlation is recommended. V/Q scan may provide better evaluation If there is high clinical concern for PE. Right anterior fourth-seventh rib fractures.  No pneumothorax. Emphysema with areas of air trapping.  No focal consolidation. Cardiomegaly. Electronically Signed   By: Elgie Collard M.D.   On: 01/03/2016 03:41   Nm Pulmonary Perf And Vent  01/03/2016  CLINICAL DATA:  Two months right-sided chest pain with dyspnea and bilateral lower extremity swelling. Pleuritic pain. EXAM: NUCLEAR MEDICINE VENTILATION - PERFUSION LUNG SCAN TECHNIQUE: Ventilation images were obtained in multiple projections using inhaled aerosol Tc-59m DTPA. Perfusion images were obtained in multiple projections after intravenous injection of Tc-66m MAA. RADIOPHARMACEUTICALS:  30.3 mCi Technetium-22m DTPA aerosol inhalation and 4.3 mCi Technetium-71m MAA IV COMPARISON:  Chest x-ray 01/03/2016 and CTA chest 01/03/2016 FINDINGS: Ventilation: Bilateral heterogeneous uptake suggesting air trapping compatible patient's known moderate emphysematous disease. Perfusion: Diffuse bilateral heterogeneous uptake corresponding to the ventilation scan and compatible with known moderate emphysematous disease. IMPRESSION: Low probability for pulmonary embolism. Electronically Signed   By: Elberta Fortis M.D.   On: 01/03/2016 09:44    ECG & Cardiac Imaging    ECHO: 01/03/2016  Left ventricle: The cavity size was mildly dilated. Wall  thickness was normal. Systolic function was severely reduced. The  estimated ejection fraction was in the range of 20% to 25%.  Severe diffuse hypokinesis with no identifiable regional  variations. Doppler parameters are consistent with restrictive  physiology, indicative of decreased left ventricular diastolic  compliance and/or  increased left atrial pressure. - Mitral valve: There was mild to moderate regurgitation directed  centrally. - Right ventricle: The cavity size was moderately dilated. Systolic  function was severely reduced. - Right atrium: The atrium was mildly dilated. - Tricuspid valve: There was moderate regurgitation. - Pulmonary arteries: Systolic pressure was moderately increased.  PA peak pressure: 48 mm Hg (S).  Impressions:  - Pulsus alternans and multiple signs of severely reduced cardiac  output are present.  EKG: 01/03/2016 ST Tw[place] inversion that is similar to previous EKG  Assessment & Plan    1. Acute systolic CHF: Pt reports a 2 month hx of increasing DOE, along with right-sided chest pain. States he has noted a gradual change in his activity level to the point  that he is unable ambulate around his home without developing dyspnea. States that he attributed this change to worsening asthma symptoms. Presented the [facility] ED with these symptoms yesterday. Underwent testing for rule out PE with CTA and VQ scan. Trop initially elevated at 0.54>>0.42, EKG shows ST with no acute change from previous EKGs.  --On physical exam today the patient is very dyspneic after walking back from the bathroom, he is unable to speak in complete sentences and breath sounds are severely diminished throughout. He does not sound wet on lung exam, but he obviously has increased work of breathing.  --He was given one dose of lasix 60mg  IV, and bp has remained soft. I would not push diuresis as he does not exhibit LEE or rales on exam --Echo 01/03/2016 showed a new finding of EF 20-25% without any cardiac hx --Does reports recent use of cocaine, with positive UDS --Cr is elevated at 1.25 --I believe the patient will need to undergo heart cath to determine drop in EF, but need to address he respiratory status at this time, with the possibility for cath tomorrow? --Talked with primary team (Dr. Susie Cassette) about  changing patient to step-down, and stopping the carvedilol (given recent use of cocaine).  2. Tachycardia: Likely secondary to declined respiratory status --PE was r/u with CTA and VQ scan --Noted to have frequent PVCs and 5 beat run of NSVT  3. Chest pain: Troponin initially 0.54>>>0.42 --denies any chest pain at the time of exam, but reports dyspnea with minimal activity.  --Currently on Lovenox --Ultimately will need a heart cath to determine cause and drop in EF.   4.Dyspnea: --Question if this is more related to COPD/Emphysema  --Would involve PCCM related to respiratory status  --Dr. Clifton James to see the patient.  Janice Coffin, NP-C Pager [phone] 01/04/2016, 5:57 PM  I have personally seen and examined this patient with Laverda Page, NP-C. I agree with the assessment and plan as outlined above. He is admitted with dyspnea, cough and increased work of breathing. Report of these symptoms as well as LE and face edema for 2-3 weeks. No evidence of LE DVT or PE. He is a cocaine user with UDS positive for cocaine and marijuana. CT chest with no pulmonary edema or focal consolidation. There is evidence of emphysema with air trapping. Echo with LVEF 20-25%. This is a new finding although he has had no previous echo to compare. His lungs do not sound wet. Minimal lower extremity edema. +JVD. RRR. Labs reviewed.  -Agree with attempt at diuresis tonight -Would hold beta blocker with recent cocaine use. No Ace-inh at this time with renal insufficiency.  -Would transfer to stepdown ICU for closer monitoring. May need to consider Bipap tonight.  -He will need right and left heart cath, possibly tomorrow.  -He has poor air movement overall. Suspect there is another component to his presentation that is pulmonary related. May need to involve the pulmonary team.   Will follow up in am.   Verne Carrow 01/04/2016 6:28 PM

## 2016-01-04 NOTE — Progress Notes (Addendum)
[facility] PROGRESS NOTE  KIARA DUEL NHA:[id number] DOB: 1975/09/22 DOA: 01/03/2016   PCP: No PCP Per Patient     Assessment/Plan: Principal Problem:   Chest pain, pleuritic Active Problems:   Tobacco abuse   Asthma   Leg swelling   Tachycardia   Normocytic anemia   Kidney disease   Acute CHF (congestive heart failure) (HCC)   Elevated troponin   Right rib fracture   Suspected pulmonary embolism (HCC)   Chest pain    40 y.o. male with medical history significant for asthma and tobacco abuse, but otherwise enjoys good health, presenting to the emergency department with 2 months of right-sided chest pain, dyspnea, and acute swelling of the bilateral lower extremities. Patient has been seen in an outside emergency department for evaluation of his chest pain and was diagnosed with right anterior rib fractures. CT chest suggestive of subsegmental PE. VQ scan negative, 2-D echo shows EF of 20-25%  Assessment and plan  1. Chest pain, right sided  - Suspected multifactorial secondary to rib fractures , no pulmonary embolism - CTA PE study not optimal, but features cardiomegaly, emphysematous lung changes, and possible RLL subsegmental PE - Suspect the elevated troponin is secondary to PE;  VQ scan negative, Venous Doppler negative  Cardiology consulted for chest pain, abnormal troponin   2. Acute systolic heart failure, EF 20-25% - There is b/l pedal edema and BNP is 1316 on admission  2-D echo shows severely reduced cardiac output - Lasix 40 mg IV given in ED . Given 60 mg IV today , schedule diuretics as BP allows  - Follow strict I/Os, daily wts, fluid-restrict diet  Cardiology consult for new onset cardiomyopathy, UDS positive for cocaine and marijuana Appears to be more SOB this afternoon, discussed with cardiology Will transfer to step down and start Bipap, defer further HF mx to cardiology  3. Tachycardia suspect compensation secondary to low  cardiac output, demand-induced ischemia with abnormal troponin - HR ~100 at rest, increases with minimal exertion  Start metoprolol IV prn for heart rate greater than 100, also start the patient on low-dose Coreg  4. Asthma without exacerbation,probable copd exacerbation - Managed with albuterol prn only at home , start xopenex nebs and iv steroids  - With daily smoking and emphysematous changes on CT, COPD is likely     5. Kidney disease of unknown chronicity  - SCr is 1.39 on admission with BUN 20 , now 1.25 - No prior available for comparison  - Patient did receive IV contrast for CT May need IV fluids is planning to do a cardiac cath  6. Hypomagnesemia Repleted   DVT prophylaxsis Lovenox  Code Status:  Full code    Family Communication: Discussed in detail with the patient/wife, all imaging results, lab results explained to the patient   Disposition Plan:  Cardiology consult     Consultants:  Cardiology  Procedures:  None  Antibiotics: Anti-infectives    None         HPI/Subjective: Patient still complaining of being short of breath, wife is by the bedside  Objective: Filed Vitals:   01/03/16 2043 01/04/16 0259 01/04/16 0400 01/04/16 0405  BP: 109/90  103/72   Pulse: 91 86 105   Temp: 98.1 F (36.7 C)  98.3 F (36.8 C)   TempSrc: Oral  Oral   Resp: 20 20 20    Height:      Weight:    89.359 kg (197 lb)  SpO2: 98%  99%    No intake or output data in the 24 hours ending 01/04/16 0817  Exam:  Examination:  General exam: Appears calm and comfortable  Respiratory system: Clear to auscultation. Respiratory effort normal. Cardiovascular system: S1 & S2 heard, RRR. No JVD, murmurs, rubs, gallops or clicks. No pedal edema. Gastrointestinal system: Abdomen is nondistended, soft and nontender. No organomegaly or masses felt. Normal bowel sounds heard. Central nervous system: Alert and oriented. No focal neurological deficits. Extremities:  Symmetric 5 x 5 power. Skin: No rashes, lesions or ulcers Psychiatry: Judgement and insight appear normal. Mood & affect appropriate.     Data Reviewed: I have personally reviewed following labs and imaging studies  Micro Results No results found for this or any previous visit (from the past 240 hour(s)).  Radiology Reports Dg Chest 2 View  01/03/2016  CLINICAL DATA:  Right-sided chest pain and dyspnea, onset today. EXAM: CHEST  2 VIEW COMPARISON:  06/15/2015 FINDINGS: There is mild chronic appearing interstitial coarsening. There is no airspace opacity. There is no effusion. The pulmonary vasculature is normal. Hilar and mediastinal contours are unremarkable and unchanged. IMPRESSION: No acute cardiopulmonary findings. Electronically Signed   By: Ellery Plunk M.D.   On: 01/03/2016 01:53   Ct Angio Chest Pe W/cm &/or Wo Cm  01/03/2016  CLINICAL DATA:  40 year old male with right-sided chest pain and shortness of breath EXAM: CT ANGIOGRAPHY CHEST WITH CONTRAST TECHNIQUE: Multidetector CT imaging of the chest was performed using the standard protocol during bolus administration of intravenous contrast. Multiplanar CT image reconstructions and MIPs were obtained to evaluate the vascular anatomy. CONTRAST:  100 cc Isovue 370 COMPARISON:  None. FINDINGS: There is centrilobular emphysema with areas of air trapping. There is no focal consolidation. No pleural effusion or pneumothorax. The central airways are patent. The thoracic aorta is grossly unremarkable. There is an area of hypo enhancement in the subsegmental branches of the right lower lobe pulmonary artery (series 6 images 445- 485). This likely represents suboptimal opacification of the pulmonary artery branches. Pulmonary embolus is less likely. Clinical correlation is recommended. V/Q scan may provide additional information there is high clinical concern for pulmonary embolism. The remainder of the pulmonary arteries appear patent. There is  moderate cardiomegaly with dilatation of the left ventricle and right atrium. Correlation with echocardiogram recommended. There is no pericardial effusion. No hilar or mediastinal adenopathy. The esophagus is grossly unremarkable. There is no thyroid nodule. There is no axillary adenopathy. The chest wall soft tissues appear unremarkable. There are fractures of the right anterior 4-7 ribs. Scroll the visualized upper abdomen is unremarkable. Review of the MIP images confirms the above findings. IMPRESSION: Suboptimal opacification due to timing of the contrast versus less likely pulmonary embolus involving the subsegmental branches of the right lower lobe. Clinical correlation is recommended. V/Q scan may provide better evaluation If there is high clinical concern for PE. Right anterior fourth-seventh rib fractures.  No pneumothorax. Emphysema with areas of air trapping.  No focal consolidation. Cardiomegaly. Electronically Signed   By: Elgie Collard M.D.   On: 01/03/2016 03:41   Nm Pulmonary Perf And Vent  01/03/2016  CLINICAL DATA:  Two months right-sided chest pain with dyspnea and bilateral lower extremity swelling. Pleuritic pain. EXAM: NUCLEAR MEDICINE VENTILATION - PERFUSION LUNG SCAN TECHNIQUE: Ventilation images were obtained in multiple projections using inhaled aerosol Tc-75m DTPA. Perfusion images were obtained in multiple projections after intravenous injection of Tc-15m MAA. RADIOPHARMACEUTICALS:  30.3 mCi Technetium-43m DTPA aerosol  inhalation and 4.3 mCi Technetium-7m MAA IV COMPARISON:  Chest x-ray 01/03/2016 and CTA chest 01/03/2016 FINDINGS: Ventilation: Bilateral heterogeneous uptake suggesting air trapping compatible patient's known moderate emphysematous disease. Perfusion: Diffuse bilateral heterogeneous uptake corresponding to the ventilation scan and compatible with known moderate emphysematous disease. IMPRESSION: Low probability for pulmonary embolism. Electronically Signed   By:  Elberta Fortis M.D.   On: 01/03/2016 09:44     CBC  Recent Labs Lab 01/03/16 0049 01/04/16 0255  WBC 8.1 5.7  HGB 12.8* 11.5*  HCT 40.0 37.4*  PLT 214 183  MCV 80.3 79.9  MCH 25.7* 24.6*  MCHC 32.0 30.7  RDW 15.4 15.0    Chemistries   Recent Labs Lab 01/03/16 0049 01/03/16 1128 01/04/16 0255  NA 140 137 136  K 4.3 3.8 4.1  CL 107 103 104  CO2 22 25 25   GLUCOSE 122* 119* 138*  BUN 20 15 14   CREATININE 1.39* 1.45* 1.25*  CALCIUM 9.1 8.6* 8.5*  MG  --   --  1.6*  AST  --  94*  --   ALT  --  149*  --   ALKPHOS  --  122  --   BILITOT  --  0.6  --    ------------------------------------------------------------------------------------------------------------------ estimated creatinine clearance is 86 mL/min (by C-G formula based on Cr of 1.25). ------------------------------------------------------------------------------------------------------------------ No results for input(s): HGBA1C in the last 72 hours. ------------------------------------------------------------------------------------------------------------------  Recent Labs  01/03/16 1128  CHOL 120  HDL 30*  LDLCALC 66  TRIG 161  CHOLHDL 4.0   ------------------------------------------------------------------------------------------------------------------  Recent Labs  01/03/16 1128  TSH 1.425   ------------------------------------------------------------------------------------------------------------------ No results for input(s): VITAMINB12, FOLATE, FERRITIN, TIBC, IRON, RETICCTPCT in the last 72 hours.  Coagulation profile No results for input(s): INR, PROTIME in the last 168 hours.   Recent Labs  01/02/16 0049  DDIMER 2.01*    Cardiac Enzymes  Recent Labs Lab 01/03/16 1128 01/03/16 1638 01/03/16 2244  TROPONINI 0.49* 0.30* 0.42*   ------------------------------------------------------------------------------------------------------------------ Invalid input(s):  POCBNP   CBG:  Recent Labs Lab 01/03/16 0632  GLUCAP 180*       Studies: Dg Chest 2 View  01/03/2016  CLINICAL DATA:  Right-sided chest pain and dyspnea, onset today. EXAM: CHEST  2 VIEW COMPARISON:  06/15/2015 FINDINGS: There is mild chronic appearing interstitial coarsening. There is no airspace opacity. There is no effusion. The pulmonary vasculature is normal. Hilar and mediastinal contours are unremarkable and unchanged. IMPRESSION: No acute cardiopulmonary findings. Electronically Signed   By: Ellery Plunk M.D.   On: 01/03/2016 01:53   Ct Angio Chest Pe W/cm &/or Wo Cm  01/03/2016  CLINICAL DATA:  40 year old male with right-sided chest pain and shortness of breath EXAM: CT ANGIOGRAPHY CHEST WITH CONTRAST TECHNIQUE: Multidetector CT imaging of the chest was performed using the standard protocol during bolus administration of intravenous contrast. Multiplanar CT image reconstructions and MIPs were obtained to evaluate the vascular anatomy. CONTRAST:  100 cc Isovue 370 COMPARISON:  None. FINDINGS: There is centrilobular emphysema with areas of air trapping. There is no focal consolidation. No pleural effusion or pneumothorax. The central airways are patent. The thoracic aorta is grossly unremarkable. There is an area of hypo enhancement in the subsegmental branches of the right lower lobe pulmonary artery (series 6 images 445- 485). This likely represents suboptimal opacification of the pulmonary artery branches. Pulmonary embolus is less likely. Clinical correlation is recommended. V/Q scan may provide additional information there is high clinical concern for pulmonary embolism. The remainder of  the pulmonary arteries appear patent. There is moderate cardiomegaly with dilatation of the left ventricle and right atrium. Correlation with echocardiogram recommended. There is no pericardial effusion. No hilar or mediastinal adenopathy. The esophagus is grossly unremarkable. There is no  thyroid nodule. There is no axillary adenopathy. The chest wall soft tissues appear unremarkable. There are fractures of the right anterior 4-7 ribs. Scroll the visualized upper abdomen is unremarkable. Review of the MIP images confirms the above findings. IMPRESSION: Suboptimal opacification due to timing of the contrast versus less likely pulmonary embolus involving the subsegmental branches of the right lower lobe. Clinical correlation is recommended. V/Q scan may provide better evaluation If there is high clinical concern for PE. Right anterior fourth-seventh rib fractures.  No pneumothorax. Emphysema with areas of air trapping.  No focal consolidation. Cardiomegaly. Electronically Signed   By: Elgie Collard M.D.   On: 01/03/2016 03:41   Nm Pulmonary Perf And Vent  01/03/2016  CLINICAL DATA:  Two months right-sided chest pain with dyspnea and bilateral lower extremity swelling. Pleuritic pain. EXAM: NUCLEAR MEDICINE VENTILATION - PERFUSION LUNG SCAN TECHNIQUE: Ventilation images were obtained in multiple projections using inhaled aerosol Tc-89m DTPA. Perfusion images were obtained in multiple projections after intravenous injection of Tc-43m MAA. RADIOPHARMACEUTICALS:  30.3 mCi Technetium-70m DTPA aerosol inhalation and 4.3 mCi Technetium-18m MAA IV COMPARISON:  Chest x-ray 01/03/2016 and CTA chest 01/03/2016 FINDINGS: Ventilation: Bilateral heterogeneous uptake suggesting air trapping compatible patient's known moderate emphysematous disease. Perfusion: Diffuse bilateral heterogeneous uptake corresponding to the ventilation scan and compatible with known moderate emphysematous disease. IMPRESSION: Low probability for pulmonary embolism. Electronically Signed   By: Elberta Fortis M.D.   On: 01/03/2016 09:44      No results found for: HGBA1C Lab Results  Component Value Date   LDLCALC 66 01/03/2016   CREATININE 1.25* 01/04/2016       Scheduled Meds: . enoxaparin (LOVENOX) injection  40 mg  Subcutaneous Q24H  . sodium chloride flush  3 mL Intravenous Q12H   Continuous Infusions:       Time spent: >30 MINS    [facility]  [facility] Pager [phone]. If 7PM-7AM, please contact night-coverage at [email or web address], password [email or web address] 01/04/2016, 8:17 AM

## 2016-01-04 NOTE — Progress Notes (Signed)
Pt received from [facility] per bed. Wife  accompanied pt. CHG bath done and MRSA Swab

## 2016-01-05 ENCOUNTER — Encounter (HOSPITAL_COMMUNITY)
Admission: EM | Disposition: A | Discharge status: Home / Self Care | Payer: Self-pay | Source: Home / Self Care | Attending: Internal Medicine

## 2016-01-05 DIAGNOSIS — I420 Dilated cardiomyopathy: Principal | ICD-10-CM

## 2016-01-05 DIAGNOSIS — J441 Chronic obstructive pulmonary disease with (acute) exacerbation: Secondary | ICD-10-CM

## 2016-01-05 DIAGNOSIS — R7989 Other specified abnormal findings of blood chemistry: Secondary | ICD-10-CM

## 2016-01-05 HISTORY — PX: CARDIAC CATHETERIZATION: SHX172

## 2016-01-05 LAB — PROTIME-INR
INR: 1.41 (ref 0.00–1.49)
Prothrombin Time: 17.4 seconds — ABNORMAL HIGH (ref 11.6–15.2)

## 2016-01-05 LAB — CBC
HEMATOCRIT: 39.2 % (ref 39.0–52.0)
HEMOGLOBIN: 12.8 g/dL — AB (ref 13.0–17.0)
MCH: 25.4 pg — ABNORMAL LOW (ref 26.0–34.0)
MCHC: 32.7 g/dL (ref 30.0–36.0)
MCV: 77.9 fL — ABNORMAL LOW (ref 78.0–100.0)
Platelets: 162 10*3/uL (ref 150–400)
RBC: 5.03 MIL/uL (ref 4.22–5.81)
RDW: 14.6 % (ref 11.5–15.5)
WBC: 3.1 10*3/uL — ABNORMAL LOW (ref 4.0–10.5)

## 2016-01-05 LAB — BLOOD GAS, ARTERIAL
Acid-Base Excess: 2.4 mmol/L — ABNORMAL HIGH (ref 0.0–2.0)
Bicarbonate: 25.9 meq/L — ABNORMAL HIGH (ref 20.0–24.0)
Drawn by: 347621
FIO2: 0.21
O2 Saturation: 95.1 %
Patient temperature: 98.6
TCO2: 27 mmol/L (ref 0–100)
pCO2 arterial: 36.3 mmHg (ref 35.0–45.0)
pH, Arterial: 7.467 — ABNORMAL HIGH (ref 7.350–7.450)
pO2, Arterial: 78.3 mmHg — ABNORMAL LOW (ref 80.0–100.0)

## 2016-01-05 LAB — POCT I-STAT 3, ART BLOOD GAS (G3+)
ACID-BASE DEFICIT: 1 mmol/L (ref 0.0–2.0)
BICARBONATE: 25.7 meq/L — AB (ref 20.0–24.0)
O2 Saturation: 91 %
PH ART: 7.328 — AB (ref 7.350–7.450)
TCO2: 27 mmol/L (ref 0–100)
pCO2 arterial: 49 mmHg — ABNORMAL HIGH (ref 35.0–45.0)
pO2, Arterial: 67 mmHg — ABNORMAL LOW (ref 80.0–100.0)

## 2016-01-05 LAB — COMPREHENSIVE METABOLIC PANEL
ALBUMIN: 2.8 g/dL — AB (ref 3.5–5.0)
ALT: 108 U/L — ABNORMAL HIGH (ref 17–63)
ANION GAP: 15 (ref 5–15)
AST: 67 U/L — ABNORMAL HIGH (ref 15–41)
Alkaline Phosphatase: 120 U/L (ref 38–126)
BILIRUBIN TOTAL: 1.5 mg/dL — AB (ref 0.3–1.2)
BUN: 17 mg/dL (ref 6–20)
CO2: 24 mmol/L (ref 22–32)
Calcium: 9.2 mg/dL (ref 8.9–10.3)
Chloride: 99 mmol/L — ABNORMAL LOW (ref 101–111)
Creatinine, Ser: 1.33 mg/dL — ABNORMAL HIGH (ref 0.61–1.24)
GFR calc Af Amer: >60 mL/min (ref >60)
GFR calc non Af Amer: >60 mL/min (ref >60)
GLUCOSE: 140 mg/dL — AB (ref 65–99)
POTASSIUM: 4.5 mmol/L (ref 3.5–5.1)
SODIUM: 138 mmol/L (ref 135–145)
TOTAL PROTEIN: 5.7 g/dL — AB (ref 6.5–8.1)

## 2016-01-05 LAB — POCT I-STAT 3, VENOUS BLOOD GAS (G3P V)
Bicarbonate: 26.6 mEq/L — ABNORMAL HIGH (ref 20.0–24.0)
O2 SAT: 41 %
PCO2 VEN: 50.5 mmHg — AB (ref 45.0–50.0)
TCO2: 28 mmol/L (ref 0–100)
pH, Ven: 7.33 — ABNORMAL HIGH (ref 7.250–7.300)
pO2, Ven: 25 mmHg — ABNORMAL LOW (ref 31.0–45.0)

## 2016-01-05 LAB — MRSA PCR SCREENING: MRSA BY PCR: NEGATIVE

## 2016-01-05 LAB — POCT ACTIVATED CLOTTING TIME: Activated Clotting Time: 100 seconds

## 2016-01-05 SURGERY — RIGHT/LEFT HEART CATH AND CORONARY ANGIOGRAPHY

## 2016-01-05 MED ORDER — IOPAMIDOL (ISOVUE-370) INJECTION 76%
INTRAVENOUS | Status: AC
  Filled 2016-01-05: qty 100

## 2016-01-05 MED ORDER — ENOXAPARIN SODIUM 40 MG/0.4ML ~~LOC~~ SOLN
40.0000 mg | SUBCUTANEOUS | Status: DC
  Administered 2016-01-06: 10:00:00 40 mg via SUBCUTANEOUS
  Filled 2016-01-05: qty 0.4

## 2016-01-05 MED ORDER — FENTANYL CITRATE (PF) 100 MCG/2ML IJ SOLN
INTRAMUSCULAR | Status: AC
  Filled 2016-01-05: qty 2

## 2016-01-05 MED ORDER — SODIUM CHLORIDE 0.9% FLUSH: 3.0000 mL | Freq: Two times a day (BID) | INTRAVENOUS | Status: DC

## 2016-01-05 MED ORDER — ACETAMINOPHEN 325 MG PO TABS: 650.0000 mg | ORAL_TABLET | ORAL | Status: DC | PRN

## 2016-01-05 MED ORDER — MIDAZOLAM HCL 2 MG/2ML IJ SOLN
INTRAMUSCULAR | Status: AC
  Filled 2016-01-05: qty 2

## 2016-01-05 MED ORDER — HEPARIN SODIUM (PORCINE) 1000 UNIT/ML IJ SOLN
INTRAMUSCULAR | Status: AC
  Filled 2016-01-05: qty 1

## 2016-01-05 MED ORDER — ONDANSETRON HCL 4 MG/2ML IJ SOLN: 4.0000 mg | Freq: Four times a day (QID) | INTRAMUSCULAR | Status: DC | PRN

## 2016-01-05 MED ORDER — ASPIRIN EC 325 MG PO TBEC
325.0000 mg | DELAYED_RELEASE_TABLET | Freq: Every day | ORAL | Status: DC
  Administered 2016-01-05: 325 mg via ORAL
  Filled 2016-01-05: qty 1

## 2016-01-05 MED ORDER — FUROSEMIDE 10 MG/ML IJ SOLN
INTRAMUSCULAR | Status: DC | PRN
  Administered 2016-01-05: 20 mg via INTRAVENOUS

## 2016-01-05 MED ORDER — SODIUM CHLORIDE 0.9 % IV SOLN
INTRAVENOUS | Status: DC
  Administered 2016-01-05: 12:00:00 via INTRAVENOUS

## 2016-01-05 MED ORDER — DIAZEPAM 5 MG PO TABS: 5.0000 mg | ORAL_TABLET | Freq: Four times a day (QID) | ORAL | Status: DC | PRN

## 2016-01-05 MED ORDER — SODIUM CHLORIDE 0.9 % IV SOLN: 250.0000 mL | INTRAVENOUS | Status: DC | PRN

## 2016-01-05 MED ORDER — HEPARIN (PORCINE) IN NACL 100-0.45 UNIT/ML-% IJ SOLN
1100.0000 [IU]/h | INTRAMUSCULAR | Status: DC
  Administered 2016-01-05: 1100 [IU]/h via INTRAVENOUS
  Filled 2016-01-05: qty 250

## 2016-01-05 MED ORDER — ASPIRIN EC 81 MG PO TBEC
81.0000 mg | DELAYED_RELEASE_TABLET | Freq: Every day | ORAL | Status: DC
  Administered 2016-01-06: 81 mg via ORAL
  Filled 2016-01-05: qty 1

## 2016-01-05 MED ORDER — LIDOCAINE HCL (PF) 1 % IJ SOLN
INTRAMUSCULAR | Status: AC
  Filled 2016-01-05: qty 30

## 2016-01-05 MED ORDER — LIDOCAINE HCL (PF) 1 % IJ SOLN
INTRAMUSCULAR | Status: DC | PRN
  Administered 2016-01-05: 25 mL via SUBCUTANEOUS

## 2016-01-05 MED ORDER — SODIUM CHLORIDE 0.9 % IV SOLN: INTRAVENOUS | Status: AC

## 2016-01-05 MED ORDER — MIDAZOLAM HCL 2 MG/2ML IJ SOLN
INTRAMUSCULAR | Status: DC | PRN
  Administered 2016-01-05: 2 mg via INTRAVENOUS

## 2016-01-05 MED ORDER — SODIUM CHLORIDE 0.9% FLUSH: 3.0000 mL | INTRAVENOUS | Status: DC | PRN

## 2016-01-05 MED ORDER — VERAPAMIL HCL 2.5 MG/ML IV SOLN
INTRAVENOUS | Status: AC
  Filled 2016-01-05: qty 2

## 2016-01-05 MED ORDER — HEPARIN (PORCINE) IN NACL 2-0.9 UNIT/ML-% IJ SOLN
INTRAMUSCULAR | Status: DC | PRN
  Administered 2016-01-05: 1000 mL

## 2016-01-05 MED ORDER — IOPAMIDOL (ISOVUE-370) INJECTION 76%
INTRAVENOUS | Status: DC | PRN
  Administered 2016-01-05: 40 mL via INTRA_ARTERIAL

## 2016-01-05 MED ORDER — LIVING BETTER WITH HEART FAILURE BOOK
Freq: Once | Status: AC
  Administered 2016-01-05: 22:00:00

## 2016-01-05 MED ORDER — FUROSEMIDE 10 MG/ML IJ SOLN
INTRAMUSCULAR | Status: AC
  Filled 2016-01-05: qty 4

## 2016-01-05 MED ORDER — HEPARIN (PORCINE) IN NACL 2-0.9 UNIT/ML-% IJ SOLN
INTRAMUSCULAR | Status: AC
  Filled 2016-01-05: qty 1000

## 2016-01-05 MED ORDER — FENTANYL CITRATE (PF) 100 MCG/2ML IJ SOLN
INTRAMUSCULAR | Status: DC | PRN
  Administered 2016-01-05: 25 ug via INTRAVENOUS

## 2016-01-05 SURGICAL SUPPLY — 9 items
CATH INFINITI 5FR MULTPACK ANG (CATHETERS) ×2 IMPLANT
CATH SWAN GANZ 7F STRAIGHT (CATHETERS) ×2 IMPLANT
GUIDEWIRE .025 260CM (WIRE) ×2 IMPLANT
KIT HEART LEFT (KITS) ×3 IMPLANT
PACK CARDIAC CATHETERIZATION (CUSTOM PROCEDURE TRAY) ×3 IMPLANT
SHEATH PINNACLE 5F 10CM (SHEATH) ×2 IMPLANT
SHEATH PINNACLE 7F 10CM (SHEATH) ×2 IMPLANT
TRANSDUCER W/STOPCOCK (MISCELLANEOUS) ×6 IMPLANT
WIRE EMERALD 3MM-J .035X150CM (WIRE) ×2 IMPLANT

## 2016-01-05 NOTE — Progress Notes (Signed)
ANTICOAGULATION CONSULT NOTE - Initial Consult  Pharmacy Consult for Heparin Indication: chest pain/ACS  Allergies  Allergen Reactions  . Hydrocodone Hives  . Penicillins Hives and Swelling    Has patient had a PCN reaction causing immediate rash, facial/tongue/throat swelling, SOB or lightheadedness with hypotension: no  Has patient had a PCN reaction causing severe rash involving mucus membranes or skin necrosis: no Has patient had a PCN reaction that required hospitalization pt was in the hospital when it happened Has patient had a PCN reaction occurring within the last 10 years: no If all of the above answers are "NO", then may proceed with Cephalosporin use.     Patient Measurements: Height: 5\' 9"  (175.3 cm) Weight: 196 lb 1.6 oz (88.95 kg) IBW/kg (Calculated) : 70.7 Heparin Dosing Weight: 88 kg  Vital Signs: Temp: 98.2 F (36.8 C) (05/16 0800) Temp Source: Oral (05/16 0800) BP: 116/63 mmHg (05/16 0800) Pulse Rate: 104 (05/16 0800)  Labs:  Recent Labs  01/03/16 0049  01/03/16 1128 01/03/16 1638 01/03/16 2244 01/04/16 0255 01/05/16 0742  HGB 12.8*  --   --   --   --  11.5* 12.8*  HCT 40.0  --   --   --   --  37.4* 39.2  PLT 214  --   --   --   --  183 162  CREATININE 1.39*  --  1.45*  --   --  1.25*  --   TROPONINI 0.54*  < > 0.49* 0.30* 0.42*  --   --   < > = values in this interval not displayed.  Estimated Creatinine Clearance: 85.8 mL/min (by C-G formula based on Cr of 1.25).   Medical History: Past Medical History  Diagnosis Date  . Asthma     Assessment: 25 YOM who presented on 5/14 with CP and B/L LE swelling concerning for DVT/PE. The patient was started empirically on full-dose Lovenox. Dopplers on 5/14 were negative for DVT and further imaging with low probability for PE - so full dose lovenox was adjusted to VTE prophylaxis dosing.  Cardiology was consulted on 5/15 for evaluation of CHF/CP - the patient was noted to have a bump in troponins so  cardiology has asked pharmacy to start heparin for anticoaguation was awaiting R/L heart cath later today.  The patient received a dose of lovenox 40 mg SQ at 0812 this AM. Will hold heparin bolus and initiate at drip rate. Hgb 12.8 << 11.5, plts wnl.  Goal of Therapy:  Heparin level 0.3-0.7 units/ml Monitor platelets by anticoagulation protocol: Yes   Plan:  1. Start Heparin at 1100 units/hr  2. Will continue to monitor for any signs/symptoms of bleeding and will follow up with heparin level in 6 hours vs post-cath today  Georgina Pillion, PharmD, BCPS Clinical Pharmacist Pager: [phone] 01/05/2016 8:51 AM

## 2016-01-05 NOTE — Progress Notes (Signed)
[facility] PROGRESS NOTE  Mark [name] HYW:[id number] DOB: [birth year] DOA: 01/03/2016   PCP: No PCP Per Patient     Assessment/Plan: Principal Problem:   Chest pain, pleuritic Active Problems:   Tobacco abuse   Asthma   Leg swelling   Tachycardia   Normocytic anemia   Kidney disease   Acute CHF (congestive heart failure) (HCC)   Elevated troponin   Right rib fracture   Suspected pulmonary embolism (HCC)   Chest pain    40 y.o. male with medical history significant for asthma and tobacco abuse, but otherwise enjoys good health, presenting to the emergency department with 2 months of right-sided chest pain, dyspnea, and acute swelling of the bilateral lower extremities. Patient has been seen in an outside emergency department for evaluation of his chest pain and was diagnosed with right anterior rib fractures. CT chest suggestive of subsegmental PE. VQ scan negative, 2-D echo shows EF of 20-25%  Assessment and plan  1. Chest pain, right sided  - Suspected multifactorial secondary to rib fractures , no pulmonary embolism, no pneumonia - CTA PE study not optimal, but features cardiomegaly, emphysematous lung changes, and possible RLL subsegmental PE - Suspect the elevated troponin is secondary to PE;  VQ scan negative, Venous Doppler negative  Cardiology consulted for chest pain, abnormal troponin   2. Acute systolic heart failure, EF 20-25% - There is b/l pedal edema and BNP is 1316 on admission  2-D echo shows severely reduced cardiac output - Lasix 40 mg IV given in ED . Given 60 mg IV 5/15 , schedule diuretics as BP allows  - Follow strict I/Os, daily wts, fluid-restrict diet  Cardiology consult for new onset cardiomyopathy, UDS positive for cocaine and marijuana Shortness of breath improved with gentle diuresis Shortness of breath improved with diuresis, steroids and BiPAP overnight Right and left heart catheterization today  3. Tachycardia  suspect compensation secondary to low cardiac output, demand-induced ischemia with abnormal troponin - HR ~100 at rest, increases with minimal exertion  Avoid beta blockers due to cocaine use  4. Asthma without exacerbation,probable copd exacerbation Patient started on scheduled nebulizer treatments, IV steroids with significant improvement overnight Likely has a component of acute asthma/COPD exacerbation    5. Kidney disease of unknown chronicity  - SCr is 1.39 on admission with BUN 20 , now 1.25 - No prior available for comparison  - Patient did receive IV contrast for CT May need IV fluids is planning to do a cardiac cath  6. Hypomagnesemia Repleted   DVT prophylaxsis Lovenox  Code Status:  Full code    Family Communication: Discussed in detail with the patient/wife, all imaging results, lab results explained to the patient   Disposition Plan:  Cardiac cath today     Consultants:  Cardiology  Procedures:  None  Antibiotics: Anti-infectives    None         HPI/Subjective: Significantly improved overnight, currently on room air, breathing comfortably  Objective: Filed Vitals:   01/05/16 0353 01/05/16 0400 01/05/16 0700 01/05/16 0800  BP:  135/118 114/73 116/63  Pulse:  99 103 104  Temp:  98.6 F (37 C)  98.2 F (36.8 C)  TempSrc:  Oral  Oral  Resp:  24 18 27   Height:      Weight: 88.95 kg (196 lb 1.6 oz)     SpO2:  98% 94% 96%    Intake/Output Summary (Last 24 hours) at 01/05/16 0827 Last data filed  at 01/05/16 0800  Gross per 24 hour  Intake    240 ml  Output   2550 ml  Net  -2310 ml    Exam:  Examination:  General exam: Appears calm and comfortable  Respiratory system: Clear to auscultation. Respiratory effort normal. Cardiovascular system: S1 & S2 heard, RRR. No JVD, murmurs, rubs, gallops or clicks. No pedal edema. Gastrointestinal system: Abdomen is nondistended, soft and nontender. No organomegaly or masses felt. Normal bowel  sounds heard. Central nervous system: Alert and oriented. No focal neurological deficits. Extremities: Symmetric 5 x 5 power. Skin: No rashes, lesions or ulcers Psychiatry: Judgement and insight appear normal. Mood & affect appropriate.     Data Reviewed: I have personally reviewed following labs and imaging studies  Micro Results Recent Results (from the past 240 hour(s))  MRSA PCR Screening     Status: None   Collection Time: 01/04/16  8:57 PM  Result Value Ref Range Status   MRSA by PCR NEGATIVE NEGATIVE Final    Comment:        The GeneXpert MRSA Assay (FDA approved for NASAL specimens only), is one component of a comprehensive MRSA colonization surveillance program. It is not intended to diagnose MRSA infection nor to guide or monitor treatment for MRSA infections.     Radiology Reports Dg Chest 2 View  01/03/2016  CLINICAL DATA:  Right-sided chest pain and dyspnea, onset today. EXAM: CHEST  2 VIEW COMPARISON:  06/15/2015 FINDINGS: There is mild chronic appearing interstitial coarsening. There is no airspace opacity. There is no effusion. The pulmonary vasculature is normal. Hilar and mediastinal contours are unremarkable and unchanged. IMPRESSION: No acute cardiopulmonary findings. Electronically Signed   By: Ellery Plunk M.D.   On: 01/03/2016 01:53   Ct Angio Chest Pe W/cm &/or Wo Cm  01/03/2016  CLINICAL DATA:  40 year old male with right-sided chest pain and shortness of breath EXAM: CT ANGIOGRAPHY CHEST WITH CONTRAST TECHNIQUE: Multidetector CT imaging of the chest was performed using the standard protocol during bolus administration of intravenous contrast. Multiplanar CT image reconstructions and MIPs were obtained to evaluate the vascular anatomy. CONTRAST:  100 cc Isovue 370 COMPARISON:  None. FINDINGS: There is centrilobular emphysema with areas of air trapping. There is no focal consolidation. No pleural effusion or pneumothorax. The central airways are patent.  The thoracic aorta is grossly unremarkable. There is an area of hypo enhancement in the subsegmental branches of the right lower lobe pulmonary artery (series 6 images 445- 485). This likely represents suboptimal opacification of the pulmonary artery branches. Pulmonary embolus is less likely. Clinical correlation is recommended. V/Q scan may provide additional information there is high clinical concern for pulmonary embolism. The remainder of the pulmonary arteries appear patent. There is moderate cardiomegaly with dilatation of the left ventricle and right atrium. Correlation with echocardiogram recommended. There is no pericardial effusion. No hilar or mediastinal adenopathy. The esophagus is grossly unremarkable. There is no thyroid nodule. There is no axillary adenopathy. The chest wall soft tissues appear unremarkable. There are fractures of the right anterior 4-7 ribs. Scroll the visualized upper abdomen is unremarkable. Review of the MIP images confirms the above findings. IMPRESSION: Suboptimal opacification due to timing of the contrast versus less likely pulmonary embolus involving the subsegmental branches of the right lower lobe. Clinical correlation is recommended. V/Q scan may provide better evaluation If there is high clinical concern for PE. Right anterior fourth-seventh rib fractures.  No pneumothorax. Emphysema with areas of air trapping.  No  focal consolidation. Cardiomegaly. Electronically Signed   By: Elgie Collard M.D.   On: 01/03/2016 03:41   Nm Pulmonary Perf And Vent  01/03/2016  CLINICAL DATA:  Two months right-sided chest pain with dyspnea and bilateral lower extremity swelling. Pleuritic pain. EXAM: NUCLEAR MEDICINE VENTILATION - PERFUSION LUNG SCAN TECHNIQUE: Ventilation images were obtained in multiple projections using inhaled aerosol Tc-6m DTPA. Perfusion images were obtained in multiple projections after intravenous injection of Tc-68m MAA. RADIOPHARMACEUTICALS:  30.3 mCi  Technetium-22m DTPA aerosol inhalation and 4.3 mCi Technetium-68m MAA IV COMPARISON:  Chest x-ray 01/03/2016 and CTA chest 01/03/2016 FINDINGS: Ventilation: Bilateral heterogeneous uptake suggesting air trapping compatible patient's known moderate emphysematous disease. Perfusion: Diffuse bilateral heterogeneous uptake corresponding to the ventilation scan and compatible with known moderate emphysematous disease. IMPRESSION: Low probability for pulmonary embolism. Electronically Signed   By: Elberta Fortis M.D.   On: 01/03/2016 09:44   Dg Chest Port 1 View  01/04/2016  CLINICAL DATA:  Acute shortness of breath. EXAM: PORTABLE CHEST 1 VIEW COMPARISON:  Jan 03, 2016. FINDINGS: Stable cardiomediastinal silhouette. Both lungs are clear. No pneumothorax or pleural effusion is noted. The visualized skeletal structures are unremarkable. IMPRESSION: No acute cardiopulmonary abnormality seen. Electronically Signed   By: Lupita Raider, M.D.   On: 01/04/2016 18:24     CBC  Recent Labs Lab 01/03/16 0049 01/04/16 0255 01/05/16 0742  WBC 8.1 5.7 3.1*  HGB 12.8* 11.5* 12.8*  HCT 40.0 37.4* 39.2  PLT 214 183 162  MCV 80.3 79.9 77.9*  MCH 25.7* 24.6* 25.4*  MCHC 32.0 30.7 32.7  RDW 15.4 15.0 14.6    Chemistries   Recent Labs Lab 01/03/16 0049 01/03/16 1128 01/04/16 0255  NA 140 137 136  K 4.3 3.8 4.1  CL 107 103 104  CO2 22 25 25   GLUCOSE 122* 119* 138*  BUN 20 15 14   CREATININE 1.39* 1.45* 1.25*  CALCIUM 9.1 8.6* 8.5*  MG  --   --  1.6*  AST  --  94*  --   ALT  --  149*  --   ALKPHOS  --  122  --   BILITOT  --  0.6  --    ------------------------------------------------------------------------------------------------------------------ estimated creatinine clearance is 85.8 mL/min (by C-G formula based on Cr of 1.25). ------------------------------------------------------------------------------------------------------------------  Recent Labs  01/03/16 1128  HGBA1C 6.3*    ------------------------------------------------------------------------------------------------------------------  Recent Labs  01/03/16 1128  CHOL 120  HDL 30*  LDLCALC 66  TRIG 161  CHOLHDL 4.0   ------------------------------------------------------------------------------------------------------------------  Recent Labs  01/03/16 1128  TSH 1.425   ------------------------------------------------------------------------------------------------------------------ No results for input(s): VITAMINB12, FOLATE, FERRITIN, TIBC, IRON, RETICCTPCT in the last 72 hours.  Coagulation profile No results for input(s): INR, PROTIME in the last 168 hours.  No results for input(s): DDIMER in the last 72 hours.  Cardiac Enzymes  Recent Labs Lab 01/03/16 1128 01/03/16 1638 01/03/16 2244  TROPONINI 0.49* 0.30* 0.42*   ------------------------------------------------------------------------------------------------------------------ Invalid input(s): POCBNP   CBG:  Recent Labs Lab 01/03/16 0632  GLUCAP 180*       Studies: Nm Pulmonary Perf And Vent  01/03/2016  CLINICAL DATA:  Two months right-sided chest pain with dyspnea and bilateral lower extremity swelling. Pleuritic pain. EXAM: NUCLEAR MEDICINE VENTILATION - PERFUSION LUNG SCAN TECHNIQUE: Ventilation images were obtained in multiple projections using inhaled aerosol Tc-63m DTPA. Perfusion images were obtained in multiple projections after intravenous injection of Tc-40m MAA. RADIOPHARMACEUTICALS:  30.3 mCi Technetium-43m DTPA aerosol inhalation and 4.3 mCi Technetium-27m MAA IV  COMPARISON:  Chest x-ray 01/03/2016 and CTA chest 01/03/2016 FINDINGS: Ventilation: Bilateral heterogeneous uptake suggesting air trapping compatible patient's known moderate emphysematous disease. Perfusion: Diffuse bilateral heterogeneous uptake corresponding to the ventilation scan and compatible with known moderate emphysematous disease.  IMPRESSION: Low probability for pulmonary embolism. Electronically Signed   By: Elberta Fortis M.D.   On: 01/03/2016 09:44   Dg Chest Port 1 View  01/04/2016  CLINICAL DATA:  Acute shortness of breath. EXAM: PORTABLE CHEST 1 VIEW COMPARISON:  Jan 03, 2016. FINDINGS: Stable cardiomediastinal silhouette. Both lungs are clear. No pneumothorax or pleural effusion is noted. The visualized skeletal structures are unremarkable. IMPRESSION: No acute cardiopulmonary abnormality seen. Electronically Signed   By: Lupita Raider, M.D.   On: 01/04/2016 18:24      Lab Results  Component Value Date   HGBA1C 6.3* 01/03/2016   Lab Results  Component Value Date   LDLCALC 66 01/03/2016   CREATININE 1.25* 01/04/2016       Scheduled Meds: . aspirin EC  325 mg Oral Daily  . levalbuterol  1.25 mg Nebulization Q8H  . methylPREDNISolone (SOLU-MEDROL) injection  80 mg Intravenous Q12H  . sodium chloride flush  3 mL Intravenous Q12H   Continuous Infusions:    LOS: 1 day    Time spent: >30 MINS    [facility]  [facility] Pager [phone]. If 7PM-7AM, please contact night-coverage at [email or web address], password [facility] 01/05/2016, 8:27 AM  LOS: 1 day

## 2016-01-05 NOTE — Progress Notes (Signed)
Per RN patient requested to come off BIPAP. Patient currently on RA.  No distress noted.

## 2016-01-05 NOTE — Progress Notes (Addendum)
Site area: RFA/RFV Site Prior to Removal:  Level 0 Pressure Applied For:94min Manual:   yes Patient Status During Pull:  stable Post Pull Site:  Level 0 Post Pull Instructions Given:  yes Post Pull Pulses Present: palpable Dressing Applied:  pressure Bedrest begins @ 1725 till 2125 Comments:

## 2016-01-05 NOTE — Progress Notes (Addendum)
     SUBJECTIVE: Feeling much better. Dyspnea improved. No chest pain. \  Tele: sinus tach  BP 116/63 mmHg  Pulse 104  Temp(Src) 98.6 F (37 C) (Oral)  Resp 27  Ht 5\' 9"  (1.753 m)  Wt 196 lb 1.6 oz (88.95 kg)  BMI 28.95 kg/m2  SpO2 96%  Intake/Output Summary (Last 24 hours) at 01/05/16 0818 Last data filed at 01/05/16 0800  Gross per 24 hour  Intake    240 ml  Output   2550 ml  Net  -2310 ml    PHYSICAL EXAM General: Well developed, well nourished, in no acute distress. Alert and oriented x 3.  Psych:  Good affect, responds appropriately Neck: No JVD. No masses noted.  Lungs: Clear bilaterally with better air movement.   Heart: RRR with no murmurs noted. Abdomen: Bowel sounds are present. Soft, non-tender.  Extremities: No lower extremity edema.   LABS: Basic Metabolic Panel:  Recent Labs  49/67/59 1128 01/04/16 0255  NA 137 136  K 3.8 4.1  CL 103 104  CO2 25 25  GLUCOSE 119* 138*  BUN 15 14  CREATININE 1.45* 1.25*  CALCIUM 8.6* 8.5*  MG  --  1.6*   CBC:  Recent Labs  01/04/16 0255 01/05/16 0742  WBC 5.7 3.1*  HGB 11.5* 12.8*  HCT 37.4* 39.2  MCV 79.9 77.9*  PLT 183 162   Cardiac Enzymes:  Recent Labs  01/03/16 1128 01/03/16 1638 01/03/16 2244  TROPONINI 0.49* 0.30* 0.42*   Fasting Lipid Panel:  Recent Labs  01/03/16 1128  CHOL 120  HDL 30*  LDLCALC 66  TRIG 163  CHOLHDL 4.0    Current Meds: . enoxaparin (LOVENOX) injection  40 mg Subcutaneous Q24H  . levalbuterol  1.25 mg Nebulization Q8H  . methylPREDNISolone (SOLU-MEDROL) injection  80 mg Intravenous Q12H  . sodium chloride flush  3 mL Intravenous Q12H     ASSESSMENT AND PLAN:  1. Acute systolic CHF/cardiomyopathy: Pt reports a 2 month hx of increasing DOE, along with right-sided chest pain. States he has noted a gradual change in his activity level to the point that he is unable ambulate around his home without developing dyspnea. States that he attributed this change  to worsening asthma symptoms. No evidence of PE. CT chest showed emphysema but no pulmonary edema. Mild troponin elevation. Cocaine positive UDS. Echo 01/03/2016 showed a new finding of EF 20-25%. Will need right and left heart cath today.  Risks and benefits reviewed. Clear liquid breakfast then NPO for afternoon cath.   2. Elevated troponin: Plan cath today to exclude CAD. BMET is pending. Start ASA. Start IV heparin.   3. Dyspnea: Likely combination of CHF along with COPD exacerbation. Much improved with lasix and steroids. Cath today to assess filling pressures and PA pressures.   My visit today included management of CHF along with discussion regarding cath to exclude CAD given new cardiomyopathy. We have discussed the possibility of underlying lung disease as cause of his respiratory distress.   Mark [name]  5/16/20178:18 AM

## 2016-01-05 NOTE — H&P (View-Only) (Signed)
Cardiology Consult    Patient ID: Mark [name] MRN: [id number], DOB/AGE: 10/09/1975   Admit date: 01/03/2016 Date of Consult: 01/04/2016  Primary Physician: No PCP Per Patient Primary Cardiologist: New Requesting Provider: Dr. Susie Cassette  Patient Profile    40 yo male with PMH of asthma and cocaine abuse who presented to the [facility] ED with 2 months of right sided chest pain/dyspnea and LEE.   Past Medical History   Past Medical History  Diagnosis Date  . Asthma     Past Surgical History  Procedure Laterality Date  . None       Allergies  Allergies  Allergen Reactions  . Hydrocodone Hives  . Penicillins Hives and Swelling    Has patient had a PCN reaction causing immediate rash, facial/tongue/throat swelling, SOB or lightheadedness with hypotension: no  Has patient had a PCN reaction causing severe rash involving mucus membranes or skin necrosis: no Has patient had a PCN reaction that required hospitalization pt was in the hospital when it happened Has patient had a PCN reaction occurring within the last 10 years: no If all of the above answers are "NO", then may proceed with Cephalosporin use.     History of Present Illness    Mark [name] is a 40 yo male with PMH of asthma/cocaine/marijuana abuse and smoking. Denies ever seeing a cardiologist in the past, or ever having had a cardiology work up in the past.   He states over the past 2 months he has had a gradual decline in his ability to perform his daily activities. He has also been having this intermittent episodes of right sided chest pain that is sharp in nature, seems to be present with activity. His wife reports that he is not in his normal state of health, has worsened to the point he can't even ambulate around his home without getting dyspneic.  He attributed this to his asthma hx and was seen recently in the ED and given an inhaler. States he has smoked for the past 20 years, along with using cocaine and marijuana  occasionally. He tried taking OTC NSAIDS for chest pain relief, but states this did not help.   Most recently he presented to the [facility] ED c/o this chest pain along with significant dyspnea and LEE. In the ED he was noted to be tachycardiac,, frequent PVCs with soft blood pressures. Chest x-ray showed no acute findings. He was worked up for PE with a CTA and VQ scan which showed low risk for VQ scan, along with moderate emphysematous disease. He did have an initial troponin of 0.54>>0.42. BNP on arrival was 1315, Cr elevated at 1.39, and D-dimer of 2.01. He was admitted through Internal Medicine service.   Cardiology has consulted related to new finding of decreased EF of 20-25%.   Inpatient Medications    . carvedilol  3.125 mg Oral BID WC  . enoxaparin (LOVENOX) injection  40 mg Subcutaneous Q24H  . levalbuterol  1.25 mg Nebulization Q8H  . methylPREDNISolone (SOLU-MEDROL) injection  80 mg Intravenous Q12H  . sodium chloride flush  3 mL Intravenous Q12H    Family History    Family History  Problem Relation Age of Onset  . Deep vein thrombosis Neg Hx   . Cardiomyopathy Father     Reports his father has an LVAD  . Heart failure Father   . Hypertension Father     Social History    Social History   Social History  .  Marital Status: Married    Spouse Name: N/A  . Number of Children: N/A  . Years of Education: N/A   Occupational History  . Not on file.   Social History Main Topics  . Smoking status: Current Every Day Smoker -- 0.50 packs/day for 20 years    Types: Cigarettes  . Smokeless tobacco: Never Used  . Alcohol Use: No  . Drug Use: Yes    Special: Cocaine, Marijuana  . Sexual Activity: Not on file   Other Topics Concern  . Not on file   Social History Narrative     Review of Systems    General:  No chills, fever, night sweats or weight changes.  Cardiovascular:  + chest pain, + dyspnea on exertion, + edema, orthopnea, palpitations, paroxysmal nocturnal  dyspnea. Dermatological: No rash, lesions/masses Respiratory: No cough, dyspnea Urologic: No hematuria, dysuria Abdominal:   No nausea, vomiting, diarrhea, bright red blood per rectum, melena, or hematemesis Neurologic:  No visual changes, wkns, changes in mental status. All other systems reviewed and are otherwise negative except as noted above.  Physical Exam    Blood pressure 124/75, pulse 110, temperature 98 F (36.7 C), temperature source Oral, resp. rate 18, height 5\' 9"  (1.753 m), weight 197 lb (89.359 kg), SpO2 100 %.  General: Young AA male. Noted respiratory distress.   Psych: Normal affect. Neuro: Alert and oriented X 3. Moves all extremities spontaneously. HEENT: Normal  Neck: Supple without bruits or JVD. Lungs:  Diminished throughout, expirattory wheezing. Heart: tachy no s3, s4, or murmurs. Abdomen: Soft, non-tender, non-distended, BS + x 4.  Extremities: No clubbing, cyanosis or 1+ edema. DP/PT/Radials 2+ and equal bilaterally.  Labs    Troponin [facility] Test)  Recent Labs  01/03/16 0350 01/03/16 1128 01/03/16 1638 01/03/16 2244  TROPONINI 0.50* 0.49* 0.30* 0.42*   Lab Results  Component Value Date   WBC 5.7 01/04/2016   HGB 11.5* 01/04/2016   HCT 37.4* 01/04/2016   MCV 79.9 01/04/2016   PLT 183 01/04/2016     Recent Labs Lab 01/03/16 1128 01/04/16 0255  NA 137 136  K 3.8 4.1  CL 103 104  CO2 25 25  BUN 15 14  CREATININE 1.45* 1.25*  CALCIUM 8.6* 8.5*  PROT 5.7*  --   BILITOT 0.6  --   ALKPHOS 122  --   ALT 149*  --   AST 94*  --   GLUCOSE 119* 138*   Lab Results  Component Value Date   CHOL 120 01/03/2016   HDL 30* 01/03/2016   LDLCALC 66 01/03/2016   TRIG 118 01/03/2016   Lab Results  Component Value Date   DDIMER 2.01* 01/02/2016     Radiology Studies    Dg Chest 2 View  01/03/2016  CLINICAL DATA:  Right-sided chest pain and dyspnea, onset today. EXAM: CHEST  2 VIEW COMPARISON:  06/15/2015 FINDINGS: There is mild  chronic appearing interstitial coarsening. There is no airspace opacity. There is no effusion. The pulmonary vasculature is normal. Hilar and mediastinal contours are unremarkable and unchanged. IMPRESSION: No acute cardiopulmonary findings. Electronically Signed   By: Ellery Plunk M.D.   On: 01/03/2016 01:53   Ct Angio Chest Pe W/cm &/or Wo Cm  01/03/2016  CLINICAL DATA:  40 year old male with right-sided chest pain and shortness of breath EXAM: CT ANGIOGRAPHY CHEST WITH CONTRAST TECHNIQUE: Multidetector CT imaging of the chest was performed using the standard protocol during bolus administration of intravenous contrast. Multiplanar CT image  reconstructions and MIPs were obtained to evaluate the vascular anatomy. CONTRAST:  100 cc Isovue 370 COMPARISON:  None. FINDINGS: There is centrilobular emphysema with areas of air trapping. There is no focal consolidation. No pleural effusion or pneumothorax. The central airways are patent. The thoracic aorta is grossly unremarkable. There is an area of hypo enhancement in the subsegmental branches of the right lower lobe pulmonary artery (series 6 images 445- 485). This likely represents suboptimal opacification of the pulmonary artery branches. Pulmonary embolus is less likely. Clinical correlation is recommended. V/Q scan may provide additional information there is high clinical concern for pulmonary embolism. The remainder of the pulmonary arteries appear patent. There is moderate cardiomegaly with dilatation of the left ventricle and right atrium. Correlation with echocardiogram recommended. There is no pericardial effusion. No hilar or mediastinal adenopathy. The esophagus is grossly unremarkable. There is no thyroid nodule. There is no axillary adenopathy. The chest wall soft tissues appear unremarkable. There are fractures of the right anterior 4-7 ribs. Scroll the visualized upper abdomen is unremarkable. Review of the MIP images confirms the above findings.  IMPRESSION: Suboptimal opacification due to timing of the contrast versus less likely pulmonary embolus involving the subsegmental branches of the right lower lobe. Clinical correlation is recommended. V/Q scan may provide better evaluation If there is high clinical concern for PE. Right anterior fourth-seventh rib fractures.  No pneumothorax. Emphysema with areas of air trapping.  No focal consolidation. Cardiomegaly. Electronically Signed   By: Elgie Collard M.D.   On: 01/03/2016 03:41   Nm Pulmonary Perf And Vent  01/03/2016  CLINICAL DATA:  Two months right-sided chest pain with dyspnea and bilateral lower extremity swelling. Pleuritic pain. EXAM: NUCLEAR MEDICINE VENTILATION - PERFUSION LUNG SCAN TECHNIQUE: Ventilation images were obtained in multiple projections using inhaled aerosol Tc-59m DTPA. Perfusion images were obtained in multiple projections after intravenous injection of Tc-66m MAA. RADIOPHARMACEUTICALS:  30.3 mCi Technetium-22m DTPA aerosol inhalation and 4.3 mCi Technetium-71m MAA IV COMPARISON:  Chest x-ray 01/03/2016 and CTA chest 01/03/2016 FINDINGS: Ventilation: Bilateral heterogeneous uptake suggesting air trapping compatible patient's known moderate emphysematous disease. Perfusion: Diffuse bilateral heterogeneous uptake corresponding to the ventilation scan and compatible with known moderate emphysematous disease. IMPRESSION: Low probability for pulmonary embolism. Electronically Signed   By: Elberta Fortis M.D.   On: 01/03/2016 09:44    ECG & Cardiac Imaging    ECHO: 01/03/2016  Left ventricle: The cavity size was mildly dilated. Wall  thickness was normal. Systolic function was severely reduced. The  estimated ejection fraction was in the range of 20% to 25%.  Severe diffuse hypokinesis with no identifiable regional  variations. Doppler parameters are consistent with restrictive  physiology, indicative of decreased left ventricular diastolic  compliance and/or  increased left atrial pressure. - Mitral valve: There was mild to moderate regurgitation directed  centrally. - Right ventricle: The cavity size was moderately dilated. Systolic  function was severely reduced. - Right atrium: The atrium was mildly dilated. - Tricuspid valve: There was moderate regurgitation. - Pulmonary arteries: Systolic pressure was moderately increased.  PA peak pressure: 48 mm Hg (S).  Impressions:  - Pulsus alternans and multiple signs of severely reduced cardiac  output are present.  EKG: 01/03/2016 ST Twave inversion that is similar to previous EKG  Assessment & Plan    1. Acute systolic CHF: Pt reports a 2 month hx of increasing DOE, along with right-sided chest pain. States he has noted a gradual change in his activity level to the point  that he is unable ambulate around his home without developing dyspnea. States that he attributed this change to worsening asthma symptoms. Presented the [facility] ED with these symptoms yesterday. Underwent testing for rule out PE with CTA and VQ scan. Trop initially elevated at 0.54>>0.42, EKG shows ST with no acute change from previous EKGs.  --On physical exam today the patient is very dyspneic after walking back from the bathroom, he is unable to speak in complete sentences and breath sounds are severely diminished throughout. He does not sound wet on lung exam, but he obviously has increased work of breathing.  --He was given one dose of lasix 60mg  IV, and bp has remained soft. I would not push diuresis as he does not exhibit LEE or rales on exam --Echo 01/03/2016 showed a new finding of EF 20-25% without any cardiac hx --Does reports recent use of cocaine, with positive UDS --Cr is elevated at 1.25 --I believe the patient will need to undergo heart cath to determine drop in EF, but need to address he respiratory status at this time, with the possibility for cath tomorrow? --Talked with primary team (Dr. Susie Cassette) about  changing patient to step-down, and stopping the carvedilol (given recent use of cocaine).  2. Tachycardia: Likely secondary to declined respiratory status --PE was r/u with CTA and VQ scan --Noted to have frequent PVCs and 5 beat run of NSVT  3. Chest pain: Troponin initially 0.54>>>0.42 --denies any chest pain at the time of exam, but reports dyspnea with minimal activity.  --Currently on Lovenox --Ultimately will need a heart cath to determine cause and drop in EF.   4.Dyspnea: --Question if this is more related to COPD/Emphysema  --Would involve PCCM related to respiratory status  --Dr. Clifton James to see the patient.  Janice Coffin, NP-C Pager [phone] 01/04/2016, 5:57 PM  I have personally seen and examined this patient with Laverda Page, NP-C. I agree with the assessment and plan as outlined above. He is admitted with dyspnea, cough and increased work of breathing. Report of these symptoms as well as LE and face edema for 2-3 weeks. No evidence of LE DVT or PE. He is a cocaine user with UDS positive for cocaine and marijuana. CT chest with no pulmonary edema or focal consolidation. There is evidence of emphysema with air trapping. Echo with LVEF 20-25%. This is a new finding although he has had no previous echo to compare. His lungs do not sound wet. Minimal lower extremity edema. +JVD. RRR. Labs reviewed.  -Agree with attempt at diuresis tonight -Would hold beta blocker with recent cocaine use. No Ace-inh at this time with renal insufficiency.  -Would transfer to stepdown ICU for closer monitoring. May need to consider Bipap tonight.  -He will need right and left heart cath, possibly tomorrow.  -He has poor air movement overall. Suspect there is another component to his presentation that is pulmonary related. May need to involve the pulmonary team.   Will follow up in am.   Verne Carrow 01/04/2016 6:28 PM

## 2016-01-05 NOTE — Progress Notes (Signed)
Pt refused CPAP/BiPAP qhs.

## 2016-01-05 NOTE — Interval H&P Note (Signed)
Cath Lab Visit (complete for each Cath Lab visit)  Clinical Evaluation Leading to the Procedure:   ACS: No.  Non-ACS:    Anginal Classification: CCS IV  Anti-ischemic medical therapy: Maximal Therapy (2 or more classes of medications)  Non-Invasive Test Results: No non-invasive testing performed  Prior CABG: No previous CABG      History and Physical Interval Note:  01/05/2016 3:29 PM  Mark [name]  has presented today for surgery, with the diagnosis of cp  The various methods of treatment have been discussed with the patient and family. After consideration of risks, benefits and other options for treatment, the patient has consented to  Procedure(s): Right/Left Heart Cath and Coronary Angiography (N/A) as a surgical intervention .  The patient's history has been reviewed, patient examined, no change in status, stable for surgery.  I have reviewed the patient's chart and labs.  Questions were answered to the patient's satisfaction.     Nicki Guadalajara

## 2016-01-06 ENCOUNTER — Telehealth: Payer: Self-pay

## 2016-01-06 ENCOUNTER — Encounter (HOSPITAL_COMMUNITY): Payer: Self-pay | Admitting: Cardiovascular Disease

## 2016-01-06 DIAGNOSIS — I429 Cardiomyopathy, unspecified: Secondary | ICD-10-CM

## 2016-01-06 DIAGNOSIS — I5030 Unspecified diastolic (congestive) heart failure: Secondary | ICD-10-CM

## 2016-01-06 DIAGNOSIS — R0781 Pleurodynia: Secondary | ICD-10-CM

## 2016-01-06 LAB — COMPREHENSIVE METABOLIC PANEL
ALK PHOS: 119 U/L (ref 38–126)
ALT: 95 U/L — AB (ref 17–63)
ANION GAP: 11 (ref 5–15)
AST: 50 U/L — ABNORMAL HIGH (ref 15–41)
Albumin: 3 g/dL — ABNORMAL LOW (ref 3.5–5.0)
BILIRUBIN TOTAL: 0.6 mg/dL (ref 0.3–1.2)
BUN: 24 mg/dL — ABNORMAL HIGH (ref 6–20)
CALCIUM: 9.2 mg/dL (ref 8.9–10.3)
CO2: 25 mmol/L (ref 22–32)
CREATININE: 1.27 mg/dL — AB (ref 0.61–1.24)
Chloride: 101 mmol/L (ref 101–111)
GFR calc non Af Amer: >60 mL/min (ref >60)
Glucose, Bld: 166 mg/dL — ABNORMAL HIGH (ref 65–99)
Potassium: 5 mmol/L (ref 3.5–5.1)
Sodium: 137 mmol/L (ref 135–145)
TOTAL PROTEIN: 6.2 g/dL — AB (ref 6.5–8.1)

## 2016-01-06 LAB — CBC
HCT: 41 % (ref 39.0–52.0)
HEMOGLOBIN: 13.1 g/dL (ref 13.0–17.0)
MCH: 25 pg — ABNORMAL LOW (ref 26.0–34.0)
MCHC: 32 g/dL (ref 30.0–36.0)
MCV: 78.4 fL (ref 78.0–100.0)
PLATELETS: 197 10*3/uL (ref 150–400)
RBC: 5.23 MIL/uL (ref 4.22–5.81)
RDW: 14.7 % (ref 11.5–15.5)
WBC: 13.7 10*3/uL — ABNORMAL HIGH (ref 4.0–10.5)

## 2016-01-06 MED ORDER — ASPIRIN 81 MG PO TBEC: 81.0000 mg | DELAYED_RELEASE_TABLET | Freq: Every day | ORAL | Status: DC

## 2016-01-06 MED ORDER — CARVEDILOL 3.125 MG PO TABS
3.1250 mg | ORAL_TABLET | Freq: Two times a day (BID) | ORAL | Status: DC
Start: 2016-01-06 — End: 2016-02-08

## 2016-01-06 MED ORDER — PREDNISONE 10 MG (21) PO TBPK: 10.0000 mg | ORAL_TABLET | Freq: Every day | ORAL | Status: DC

## 2016-01-06 MED ORDER — LISINOPRIL 5 MG PO TABS: 5.0000 mg | ORAL_TABLET | Freq: Every day | ORAL | Status: DC

## 2016-01-06 MED ORDER — FUROSEMIDE 40 MG PO TABS: 40.0000 mg | ORAL_TABLET | Freq: Every day | ORAL | Status: DC

## 2016-01-06 MED ORDER — LISINOPRIL 5 MG PO TABS
5.0000 mg | ORAL_TABLET | Freq: Every day | ORAL | Status: DC
  Administered 2016-01-06: 10:00:00 5 mg via ORAL
  Filled 2016-01-06: qty 1

## 2016-01-06 MED ORDER — GUAIFENESIN-DM 100-10 MG/5ML PO SYRP: 5.0000 mL | ORAL_SOLUTION | ORAL | Status: DC | PRN

## 2016-01-06 MED ORDER — FUROSEMIDE 40 MG PO TABS
40.0000 mg | ORAL_TABLET | Freq: Every day | ORAL | Status: DC
  Administered 2016-01-06: 10:00:00 40 mg via ORAL
  Filled 2016-01-06: qty 1

## 2016-01-06 MED ORDER — CARVEDILOL 3.125 MG PO TABS
3.1250 mg | ORAL_TABLET | Freq: Two times a day (BID) | ORAL | Status: DC
  Administered 2016-01-06: 3.125 mg via ORAL
  Filled 2016-01-06: qty 1

## 2016-01-06 MED ORDER — CARVEDILOL 3.125 MG PO TABS: 3.1250 mg | ORAL_TABLET | Freq: Two times a day (BID) | ORAL | Status: DC

## 2016-01-06 MED FILL — Verapamil HCl IV Soln 2.5 MG/ML: INTRAVENOUS | Qty: 2 | Status: AC

## 2016-01-06 MED FILL — Heparin Sodium (Porcine) Inj 1000 Unit/ML: INTRAMUSCULAR | Qty: 10 | Status: AC

## 2016-01-06 NOTE — Care Management Note (Signed)
Case Management Note  Patient Details  Name: Mark [name] MRN: [id number] Date of Birth: 15-Oct-1975  Subjective/Objective:   Patient is for dc, s/p clean cath, patient does not have insurance or pcp.  NCM trying to get apt at [facility] clinic, Erskine Squibb with Transitional care working on , will call patient with apt time at [phone] when received.  Patient would like to get medications from [facility] at [facility], they are on $4 list.  Patient is for dc today.                  Action/Plan:   Expected Discharge Date:                  Expected Discharge Plan:  Home/Self Care  In-House Referral:     Discharge planning Services  CM Consult, Indigent Health Clinic, Medication Assistance  Post Acute Care Choice:    Choice offered to:     DME Arranged:    DME Agency:     HH Arranged:    HH Agency:     Status of Service:     Medicare Important Message Given:    Date Medicare IM Given:    Medicare IM give by:    Date Additional Medicare IM Given:    Additional Medicare Important Message give by:     If discussed at Long Length of Stay Meetings, dates discussed:    Additional Comments:  Leone Haven, RN 01/06/2016, 11:51 AM

## 2016-01-06 NOTE — Telephone Encounter (Signed)
Call received from Letha Cape, RN CM requesting an hospital follow up appointment for the patient at [facility]. Informed her that unfortunately there are not any appointments available at this time. He can call the clinic at any time to check for cancellations.

## 2016-01-06 NOTE — Discharge Summary (Signed)
Physician Discharge Summary  Mark [name] MRN: [id number] DOB/AGE: 1975-11-29 40 y.o.  PCP: No PCP Per Patient   Admit date: 01/03/2016 Discharge date: 01/06/2016  Discharge Diagnoses:     Principal Problem:   Chest pain, pleuritic Active Problems:   Tobacco abuse   Asthma   Leg swelling   Tachycardia   Normocytic anemia   Kidney disease   Acute CHF (congestive heart failure) (HCC)   Elevated troponin   Right rib fracture   Suspected pulmonary embolism (HCC)   Chest pain   (HFpEF) heart failure with preserved ejection fraction ([place]) Dilated congestive nonischemic cardiomyopathy   Follow-up recommendations Follow-up with PCP in 3-5 days , including all  additional recommended appointments as below Follow-up CBC, CMP in 3-5 days Patient needs to follow-up with cardiology, primary care provider,       Current Discharge Medication List    START taking these medications   Details  aspirin EC 81 MG EC tablet Take 1 tablet (81 mg total) by mouth daily. Qty: 30 tablet, Refills: 0    carvedilol (COREG) 3.125 MG tablet Take 1 tablet (3.125 mg total) by mouth 2 (two) times daily with a meal. Qty: 60 tablet, Refills: 1    furosemide (LASIX) 40 MG tablet Take 1 tablet (40 mg total) by mouth daily. Qty: 30 tablet, Refills: 2    guaiFENesin-dextromethorphan (ROBITUSSIN DM) 100-10 MG/5ML syrup Take 5 mLs by mouth every 4 (four) hours as needed for cough. Qty: 118 mL, Refills: 0    lisinopril (PRINIVIL,ZESTRIL) 5 MG tablet Take 1 tablet (5 mg total) by mouth daily. Qty: 30 tablet, Refills: 2    predniSONE (STERAPRED UNI-PAK 21 TAB) 10 MG (21) TBPK tablet Take 1 tablet (10 mg total) by mouth daily. As per  dose pack instructions Qty: 21 tablet, Refills: 0      CONTINUE these medications which have NOT CHANGED   Details  albuterol (PROVENTIL HFA;VENTOLIN HFA) 108 (90 BASE) MCG/ACT inhaler Inhale 1-2 puffs into the lungs every 6 (six) hours as needed for wheezing or  shortness of breath.      STOP taking these medications     naproxen sodium (ANAPROX) 220 MG tablet          Discharge Condition: *Stable Discharge Instructions Get Medicines reviewed and adjusted: Please take all your medications with you for your next visit with your Primary MD  Please request your Primary MD to go over all hospital tests and procedure/radiological results at the follow up, please ask your Primary MD to get all Hospital records sent to his/her office.  If you experience worsening of your admission symptoms, develop shortness of breath, life threatening emergency, suicidal or homicidal thoughts you must seek medical attention immediately by calling 911 or calling your MD immediately if symptoms less severe.  You must read complete instructions/literature along with all the possible adverse reactions/side effects for all the Medicines you take and that have been prescribed to you. Take any new Medicines after you have completely understood and accpet all the possible adverse reactions/side effects.   Do not drive when taking Pain medications.   Do not take more than prescribed Pain, Sleep and Anxiety Medications  Special Instructions: If you have smoked or chewed Tobacco in the last 2 yrs please stop smoking, stop any regular Alcohol and or any Recreational drug use.  Wear Seat belts while driving.  Please note  You were cared for by a hospitalist during your hospital stay. Once you are discharged,  your primary care physician will handle any further medical issues. Please note that NO REFILLS for any discharge medications will be authorized once you are discharged, as it is imperative that you return to your primary care physician (or establish a relationship with a primary care physician if you do not have one) for your aftercare needs so that they can reassess your need for medications and monitor your lab values.  Discharge Instructions    Diet - low sodium  heart healthy    Complete by:  As directed      Increase activity slowly    Complete by:  As directed             Allergies  Allergen Reactions  . Hydrocodone Hives  . Penicillins Hives and Swelling    Has patient had a PCN reaction causing immediate rash, facial/tongue/throat swelling, SOB or lightheadedness with hypotension: no  Has patient had a PCN reaction causing severe rash involving mucus membranes or skin necrosis: no Has patient had a PCN reaction that required hospitalization pt was in the hospital when it happened Has patient had a PCN reaction occurring within the last 10 years: no If all of the above answers are "NO", then may proceed with Cephalosporin use.       Disposition: 01-Home or Self Care   Consults: Cardiology     Significant Diagnostic Studies:  Dg Chest 2 View  01/03/2016  CLINICAL DATA:  Right-sided chest pain and dyspnea, onset today. EXAM: CHEST  2 VIEW COMPARISON:  06/15/2015 FINDINGS: There is mild chronic appearing interstitial coarsening. There is no airspace opacity. There is no effusion. The pulmonary vasculature is normal. Hilar and mediastinal contours are unremarkable and unchanged. IMPRESSION: No acute cardiopulmonary findings. Electronically Signed   By: Andreas Newport M.D.   On: 01/03/2016 01:53   Ct Angio Chest Pe W/cm &/or Wo Cm  01/03/2016  CLINICAL DATA:  40 year old male with right-sided chest pain and shortness of breath EXAM: CT ANGIOGRAPHY CHEST WITH CONTRAST TECHNIQUE: Multidetector CT imaging of the chest was performed using the standard protocol during bolus administration of intravenous contrast. Multiplanar CT image reconstructions and MIPs were obtained to evaluate the vascular anatomy. CONTRAST:  100 cc Isovue 370 COMPARISON:  None. FINDINGS: There is centrilobular emphysema with areas of air trapping. There is no focal consolidation. No pleural effusion or pneumothorax. The central airways are patent. The thoracic aorta  is grossly unremarkable. There is an area of hypo enhancement in the subsegmental branches of the right lower lobe pulmonary artery (series 6 images 445- 485). This likely represents suboptimal opacification of the pulmonary artery branches. Pulmonary embolus is less likely. Clinical correlation is recommended. V/Q scan may provide additional information there is high clinical concern for pulmonary embolism. The remainder of the pulmonary arteries appear patent. There is moderate cardiomegaly with dilatation of the left ventricle and right atrium. Correlation with echocardiogram recommended. There is no pericardial effusion. No hilar or mediastinal adenopathy. The esophagus is grossly unremarkable. There is no thyroid nodule. There is no axillary adenopathy. The chest wall soft tissues appear unremarkable. There are fractures of the right anterior 4-7 ribs. Scroll the visualized upper abdomen is unremarkable. Review of the MIP images confirms the above findings. IMPRESSION: Suboptimal opacification due to timing of the contrast versus less likely pulmonary embolus involving the subsegmental branches of the right lower lobe. Clinical correlation is recommended. V/Q scan may provide better evaluation If there is high clinical concern for PE. Right anterior fourth-seventh rib  fractures.  No pneumothorax. Emphysema with areas of air trapping.  No focal consolidation. Cardiomegaly. Electronically Signed   By: Anner Crete M.D.   On: 01/03/2016 03:41   Nm Pulmonary Perf And Vent  01/03/2016  CLINICAL DATA:  Two months right-sided chest pain with dyspnea and bilateral lower extremity swelling. Pleuritic pain. EXAM: NUCLEAR MEDICINE VENTILATION - PERFUSION LUNG SCAN TECHNIQUE: Ventilation images were obtained in multiple projections using inhaled aerosol Tc-36mDTPA. Perfusion images were obtained in multiple projections after intravenous injection of Tc-955mAA. RADIOPHARMACEUTICALS:  30.3 mCi Technetium-9938mPA  aerosol inhalation and 4.3 mCi Technetium-20m52m IV COMPARISON:  Chest x-ray 01/03/2016 and CTA chest 01/03/2016 FINDINGS: Ventilation: Bilateral heterogeneous uptake suggesting air trapping compatible patient's known moderate emphysematous disease. Perfusion: Diffuse bilateral heterogeneous uptake corresponding to the ventilation scan and compatible with known moderate emphysematous disease. IMPRESSION: Low probability for pulmonary embolism. Electronically Signed   By: DaniMarin Olp.   On: 01/03/2016 09:44   Dg Chest Port 1 View  01/04/2016  CLINICAL DATA:  Acute shortness of breath. EXAM: PORTABLE CHEST 1 VIEW COMPARISON:  Jan 03, 2016. FINDINGS: Stable cardiomediastinal silhouette. Both lungs are clear. No pneumothorax or pleural effusion is noted. The visualized skeletal structures are unremarkable. IMPRESSION: No acute cardiopulmonary abnormality seen. Electronically Signed   By: JameMarijo ConceptionD.   On: 01/04/2016 18:24    2-D echo LV EF: 20% - 25%  ------------------------------------------------------------------- Indications: Pulmonary embolus 415.19.  ------------------------------------------------------------------- History: Risk factors: Current tobacco use.  ------------------------------------------------------------------- Study Conclusions  - Left ventricle: The cavity size was mildly dilated. Wall  thickness was normal. Systolic function was severely reduced. The  estimated ejection fraction was in the range of 20% to 25%.  Severe diffuse hypokinesis with no identifiable regional  variations. Doppler parameters are consistent with restrictive  physiology, indicative of decreased left ventricular diastolic  compliance and/or increased left atrial pressure. - Mitral valve: There was mild to moderate regurgitation directed  centrally. - Right ventricle: The cavity size was moderately dilated. Systolic  function was severely reduced. - Right  atrium: The atrium was mildly dilated. - Tricuspid valve: There was moderate regurgitation. - Pulmonary arteries: Systolic pressure was moderately increased.  PA peak pressure: 48 mm Hg (S).  Impressions:  - Pulsus alternans and multiple signs of severely reduced cardiac  output are present.     Cardiac cath 01/05/16 Dilated congestive nonischemic cardiomyopathy with echo Doppler assessment of EF at 20 - 25%.  Elevation of right heart pressures with moderate pulmonary hypertension.  Normal coronary arteries  Filed Weights   01/05/16 0353 01/05/16 1300 01/06/16 0515  Weight: 88.95 kg (196 lb 1.6 oz) 89.7 kg (197 lb 12 oz) 87.1 kg (192 lb 0.3 oz)     Microbiology: Recent Results (from the past 240 hour(s))  MRSA PCR Screening     Status: None   Collection Time: 01/04/16  8:57 PM  Result Value Ref Range Status   MRSA by PCR NEGATIVE NEGATIVE Final    Comment:        The GeneXpert MRSA Assay (FDA approved for NASAL specimens only), is one component of a comprehensive MRSA colonization surveillance program. It is not intended to diagnose MRSA infection nor to guide or monitor treatment for MRSA infections.        Blood Culture No results found for: SDES, SPECREQUEST, CULT, REPTSTATUS    Labs: Results for orders placed or performed during the hospital encounter of 01/03/16 (from the past 48 hour(s))  Blood  gas, arterial     Status: Abnormal   Collection Time: 01/04/16  6:03 PM  Result Value Ref Range   FIO2 0.21    Delivery systems ROOM AIR    pH, Arterial 7.449 7.350 - 7.450   pCO2 arterial 34.0 (L) 35.0 - 45.0 mmHg   pO2, Arterial 78.0 (L) 80.0 - 100.0 mmHg   Bicarbonate 23.3 20.0 - 24.0 mEq/L   TCO2 24.3 0 - 100 mmol/L   Acid-base deficit 0.3 0.0 - 2.0 mmol/L   O2 Saturation 95.2 %   Patient temperature 98.6    Collection site RIGHT RADIAL    Drawn by [id number]    Sample type ARTERIAL    Allens test (pass/fail) PASS PASS  MRSA PCR Screening      Status: None   Collection Time: 01/04/16  8:57 PM  Result Value Ref Range   MRSA by PCR NEGATIVE NEGATIVE    Comment:        The GeneXpert MRSA Assay (FDA approved for NASAL specimens only), is one component of a comprehensive MRSA colonization surveillance program. It is not intended to diagnose MRSA infection nor to guide or monitor treatment for MRSA infections.   Blood gas, arterial     Status: Abnormal   Collection Time: 01/05/16  5:32 AM  Result Value Ref Range   FIO2 0.21    pH, Arterial 7.467 (H) 7.350 - 7.450   pCO2 arterial 36.3 35.0 - 45.0 mmHg   pO2, Arterial 78.3 (L) 80.0 - 100.0 mmHg   Bicarbonate 25.9 (H) 20.0 - 24.0 mEq/L   TCO2 27.0 0 - 100 mmol/L   Acid-Base Excess 2.4 (H) 0.0 - 2.0 mmol/L   O2 Saturation 95.1 %   Patient temperature 98.6    Collection site RIGHT RADIAL    Drawn by [id number]    Sample type ARTERIAL DRAW   Comprehensive metabolic panel     Status: Abnormal   Collection Time: 01/05/16  7:42 AM  Result Value Ref Range   Sodium 138 135 - 145 mmol/L   Potassium 4.5 3.5 - 5.1 mmol/L   Chloride 99 (L) 101 - 111 mmol/L   CO2 24 22 - 32 mmol/L   Glucose, Bld 140 (H) 65 - 99 mg/dL   BUN 17 6 - 20 mg/dL   Creatinine, Ser 1.33 (H) 0.61 - 1.24 mg/dL   Calcium 9.2 8.9 - 10.3 mg/dL   Total Protein 5.7 (L) 6.5 - 8.1 g/dL   Albumin 2.8 (L) 3.5 - 5.0 g/dL   AST 67 (H) 15 - 41 U/L   ALT 108 (H) 17 - 63 U/L   Alkaline Phosphatase 120 38 - 126 U/L   Total Bilirubin 1.5 (H) 0.3 - 1.2 mg/dL   GFR calc non Af Amer >60 >60 mL/min   GFR calc Af Amer >60 >60 mL/min    Comment: (NOTE) The eGFR has been calculated using the CKD EPI equation. This calculation has not been validated in all clinical situations. eGFR's persistently <60 mL/min signify possible Chronic Kidney Disease.    Anion gap 15 5 - 15  CBC     Status: Abnormal   Collection Time: 01/05/16  7:42 AM  Result Value Ref Range   WBC 3.1 (L) 4.0 - 10.5 K/uL   RBC 5.03 4.22 - 5.81 MIL/uL    Hemoglobin 12.8 (L) 13.0 - 17.0 g/dL   HCT 39.2 39.0 - 52.0 %   MCV 77.9 (L) 78.0 - 100.0 fL   MCH 25.4 (L) 26.0 -  34.0 pg   MCHC 32.7 30.0 - 36.0 g/dL   RDW 14.6 11.5 - 15.5 %   Platelets 162 150 - 400 K/uL  Protime-INR     Status: Abnormal   Collection Time: 01/05/16  1:08 PM  Result Value Ref Range   Prothrombin Time 17.4 (H) 11.6 - 15.2 seconds   INR 1.41 0.00 - 1.49  I-STAT 3, venous blood gas (G3P V)     Status: Abnormal   Collection Time: 01/05/16  3:54 PM  Result Value Ref Range   pH, Ven 7.330 (H) 7.250 - 7.300   pCO2, Ven 50.5 (H) 45.0 - 50.0 mmHg   pO2, Ven 25.0 (L) 31.0 - 45.0 mmHg   Bicarbonate 26.6 (H) 20.0 - 24.0 mEq/L   TCO2 28 0 - 100 mmol/L   O2 Saturation 41.0 %   Patient temperature HIDE    Sample type VENOUS    Comment NOTIFIED PHYSICIAN   I-STAT 3, arterial blood gas (G3+)     Status: Abnormal   Collection Time: 01/05/16  3:56 PM  Result Value Ref Range   pH, Arterial 7.328 (L) 7.350 - 7.450   pCO2 arterial 49.0 (H) 35.0 - 45.0 mmHg   pO2, Arterial 67.0 (L) 80.0 - 100.0 mmHg   Bicarbonate 25.7 (H) 20.0 - 24.0 mEq/L   TCO2 27 0 - 100 mmol/L   O2 Saturation 91.0 %   Acid-base deficit 1.0 0.0 - 2.0 mmol/L   Patient temperature HIDE    Sample type ARTERIAL   POCT Activated clotting time     Status: None   Collection Time: 01/05/16  4:13 PM  Result Value Ref Range   Activated Clotting Time 100 seconds  Comprehensive metabolic panel     Status: Abnormal   Collection Time: 01/06/16  5:21 AM  Result Value Ref Range   Sodium 137 135 - 145 mmol/L   Potassium 5.0 3.5 - 5.1 mmol/L   Chloride 101 101 - 111 mmol/L   CO2 25 22 - 32 mmol/L   Glucose, Bld 166 (H) 65 - 99 mg/dL   BUN 24 (H) 6 - 20 mg/dL   Creatinine, Ser 1.27 (H) 0.61 - 1.24 mg/dL   Calcium 9.2 8.9 - 10.3 mg/dL   Total Protein 6.2 (L) 6.5 - 8.1 g/dL   Albumin 3.0 (L) 3.5 - 5.0 g/dL   AST 50 (H) 15 - 41 U/L   ALT 95 (H) 17 - 63 U/L   Alkaline Phosphatase 119 38 - 126 U/L   Total Bilirubin  0.6 0.3 - 1.2 mg/dL   GFR calc non Af Amer >60 >60 mL/min   GFR calc Af Amer >60 >60 mL/min    Comment: (NOTE) The eGFR has been calculated using the CKD EPI equation. This calculation has not been validated in all clinical situations. eGFR's persistently <60 mL/min signify possible Chronic Kidney Disease.    Anion gap 11 5 - 15  CBC     Status: Abnormal   Collection Time: 01/06/16  5:21 AM  Result Value Ref Range   WBC 13.7 (H) 4.0 - 10.5 K/uL   RBC 5.23 4.22 - 5.81 MIL/uL   Hemoglobin 13.1 13.0 - 17.0 g/dL   HCT 41.0 39.0 - 52.0 %   MCV 78.4 78.0 - 100.0 fL   MCH 25.0 (L) 26.0 - 34.0 pg   MCHC 32.0 30.0 - 36.0 g/dL   RDW 14.7 11.5 - 15.5 %   Platelets 197 150 - 400 K/uL  Lipid Panel     Component Value Date/Time   CHOL 120 01/03/2016 1128   TRIG 118 01/03/2016 1128   HDL 30* 01/03/2016 1128   CHOLHDL 4.0 01/03/2016 1128   VLDL 24 01/03/2016 1128   LDLCALC 66 01/03/2016 1128     Lab Results  Component Value Date   HGBA1C 6.3* 01/03/2016     Lab Results  Component Value Date   LDLCALC 66 01/03/2016   CREATININE 1.27* 01/06/2016     HPI : 40 yo male with PMH of asthma/cocaine/marijuana abuse and smoking. Denies ever seeing a cardiologist in the past, or ever having had a cardiology work up in the past.   He states over the past 2 months he has had a gradual decline in his ability to perform his daily activities. He has also been having this intermittent episodes of right sided chest pain that is sharp in nature, seems to be present with activity. His wife reports that he is not in his normal state of health, has worsened to the point he can't even ambulate around his home without getting dyspneic. He attributed this to his asthma hx and was seen recently in the ED and given an inhaler. States he has smoked for the past 20 years, along with using cocaine and marijuana occasionally. He tried taking OTC NSAIDS for chest pain relief, but states this did not help.    Most recently he presented to the [facility] ED c/o this chest pain along with significant dyspnea and LEE. In the ED he was noted to be tachycardiac,, frequent PVCs with soft blood pressures. Chest x-ray showed no acute findings. He was worked up for PE with a CTA and VQ scan which showed low risk for VQ scan, along with moderate emphysematous disease. He did have an initial troponin of 0.54>>0.42. BNP on arrival was 1315, Cr elevated at 1.39, and D-dimer of 2.01. He was admitted through Internal Medicine service.   Cardiology has consulted related to new finding of decreased EF of 20-25%.   HOSPITAL COURSE:    1. Chest pain, right sided  - Suspected multifactorial secondary to previuous  rib fractures , no pulmonary embolism, no pneumonia - CTA PE study not optimal, but shows  cardiomegaly, emphysematous lung changes, and possible RLL subsegmental PE - Suspect the elevated troponin is secondary to PE; VQ scan negative, Venous Doppler negative Cardiology consulted for chest pain, abnormal troponin   2. Acute systolic heart failure, EF 20-25% - There is b/l pedal edema and BNP is 1316 on admission  2-D echo shows severely reduced cardiac output - Lasix 40 mg IV given in ED . Given 60 mg IV 5/15 , schedule diuretics as BP allows  Status post right and left heart catheterization,Diagnosed with dilated congestive nonischemic cardiomyopathy after cath showed normal coronaries and elevation of right heart pressures with moderate pulmonary hypertension  UDS positive for cocaine and marijuana A component of his presentation with dyspnea is likely COPD/asthma related as there was a dramatic response to steroids. In regards to his cardiomyopathy, would use Coreg 3.125 mg po BID, Lisinopril 5 mg daily and Lasix 40 mg daily. His wedge pressure was elevated at time of cath yesterday suggesting volume overload. He will need close follow up in our office post discharge. He is asked to stop using  cocaine and etoh Shortness of breath improved dramatically  with diuresis, steroids and BiPAP overnight    3. Tachycardia suspect compensation secondary to low cardiac output, demand-induced ischemia  with abnormal troponin - HR ~100 at rest, increases with minimal exertion   now on coreg   4. Asthma without exacerbation,probable copd exacerbation Patient started on scheduled nebulizer treatments, IV steroids with significant improvement overnight Likely has a component of acute asthma/COPD exacerbation   5. Kidney disease of unknown chronicity  - SCr is 1.39 on admission with BUN 20 , now 1.27 - No prior available for comparison  - Patient did receive IV contrast for CT    6. Hypomagnesemia Repleted  Discharge Exam:    Blood pressure 117/88, pulse 108, temperature 97.7 F (36.5 C), temperature source Oral, resp. rate 26, height '5\' 9"'  (1.753 m), weight 87.1 kg (192 lb 0.3 oz), SpO2 99 %. General: Young AA male. Noted respiratory distress.  Psych: Normal affect. Neuro: Alert and oriented X 3. Moves all extremities spontaneously. HEENT: Normal Neck: Supple without bruits or JVD. Lungs: Diminished throughout, expirattory wheezing. Heart: tachy no s3, s4, or murmurs. Abdomen: Soft, non-tender, non-distended, BS + x 4.  Extremities: No clubbing, cyanosis or 1+ edema. DP/PT/Radials 2+ and equal bilaterally.      Follow-up Information    Follow up with pcp. Schedule an appointment as soon as possible for a visit in 3 days.   Why:  Hospital follow-up      Follow up with Lauree Chandler, MD. Schedule an appointment as soon as possible for a visit in 2 weeks.   Specialty:  Cardiology   Why:  Hospital follow-up, please call for appointment   Contact information:   [place]. [address] [place] [address] [phone]       Signed: Reyne Dumas 01/06/2016, 10:49 AM        Time spent >45 mins

## 2016-01-06 NOTE — Progress Notes (Signed)
Patient Profile: 40 yo AA male with PMH of asthma and cocaine abuse who presented to the [facility] ED with a CC of 2 months of right sided chest pain/dyspnea and LEE. An echo Doppler study showed an EF of 20-25%. He was subsequently referred for right and left heart cardiac catheterization.  Diagnosed with dilated congestive nonischemic cardiomyopathy after cath showed normal coronaries and elevation of right heart pressures with moderate pulmonary hypertension.   Subjective: No major complaints. Main concern is poor access to healthcare. Breathing has improved.   Objective: Vital signs in last 24 hours: Temp:  [97.1 F (36.2 C)-98.2 F (36.8 C)] 97.5 F (36.4 C) (05/17 0515) Pulse Rate:  [0-118] 106 (05/17 0515) Resp:  [0-56] 25 (05/17 0515) BP: (79-143)/(61-119) 120/85 mmHg (05/17 0515) SpO2:  [0 %-100 %] 99 % (05/17 0515) Weight:  [192 lb 0.3 oz (87.1 kg)-197 lb 12 oz (89.7 kg)] 192 lb 0.3 oz (87.1 kg) (05/17 0515) Last BM Date: 01/04/16  Intake/Output from previous day: 05/16 0701 - 05/17 0700 In: 1538.8 [P.O.:1200; I.V.:338.8] Out: 1200 [Urine:1200] Intake/Output this shift: Total I/O In: 705 [P.O.:480; I.V.:225] Out: 450 [Urine:450]  Medications Current Facility-Administered Medications  Medication Dose Route Frequency Provider Last Rate Last Dose  . 0.9 %  sodium chloride infusion  250 mL Intravenous PRN Lavone Neri Opyd, MD      . 0.9 %  sodium chloride infusion  250 mL Intravenous PRN Lennette Bihari, MD      . acetaminophen (TYLENOL) tablet 650 mg  650 mg Oral Q4H PRN Briscoe Deutscher, MD      . aspirin EC tablet 81 mg  81 mg Oral Daily Lennette Bihari, MD   0 mg at 01/05/16 1800  . diazepam (VALIUM) tablet 5 mg  5 mg Oral Q6H PRN Lennette Bihari, MD      . enoxaparin (LOVENOX) injection 40 mg  40 mg Subcutaneous Q24H Lennette Bihari, MD      . fentaNYL (SUBLIMAZE) injection 25 mcg  25 mcg Intravenous Q2H PRN Briscoe Deutscher, MD   25 mcg at 01/04/16 1344  .  guaiFENesin-dextromethorphan (ROBITUSSIN DM) 100-10 MG/5ML syrup 5 mL  5 mL Oral Q4H PRN Richarda Overlie, MD   5 mL at 01/04/16 1606  . ipratropium-albuterol (DUONEB) 0.5-2.5 (3) MG/3ML nebulizer solution 3 mL  3 mL Nebulization Q4H PRN Briscoe Deutscher, MD   3 mL at 01/04/16 1037  . levalbuterol (XOPENEX) nebulizer solution 1.25 mg  1.25 mg Nebulization Q8H Richarda Overlie, MD   1.25 mg at 01/06/16 0505  . methylPREDNISolone sodium succinate (SOLU-MEDROL) 125 mg/2 mL injection 80 mg  80 mg Intravenous Q12H Richarda Overlie, MD   80 mg at 01/06/16 0542  . metoprolol (LOPRESSOR) injection 5 mg  5 mg Intravenous Q4H PRN Richarda Overlie, MD      . nitroGLYCERIN (NITROSTAT) SL tablet 0.4 mg  0.4 mg Sublingual Q5 min PRN Kristen N Ward, DO      . ondansetron (ZOFRAN) injection 4 mg  4 mg Intravenous Q6H PRN Lavone Neri Opyd, MD      . sodium chloride flush (NS) 0.9 % injection 3 mL  3 mL Intravenous Q12H Lavone Neri Opyd, MD   3 mL at 01/05/16 1002  . sodium chloride flush (NS) 0.9 % injection 3 mL  3 mL Intravenous PRN Lavone Neri Opyd, MD      . sodium chloride flush (NS) 0.9 % injection 3 mL  3 mL Intravenous  Q12H Lennette Bihari, MD   3 mL at 01/05/16 2115  . sodium chloride flush (NS) 0.9 % injection 3 mL  3 mL Intravenous PRN Lennette Bihari, MD      . technetium TC 50M diethylenetriame-pentaacetic acid (DTPA) injection 30.3 milli Curie  30.3 milli Curie Inhalation Once PRN Florencia Reasons, MD        PE: General appearance: alert, cooperative and no distress Neck: no carotid bruit and no JVD Lungs: clear to auscultation bilaterally Heart: regular rate and rhythm, S1, S2 normal, no murmur, click, rub or gallop Extremities: no LEE Pulses: 2+ and symmetric Skin: warm and dry Neurologic: Grossly normal  Lab Results:   Recent Labs  01/04/16 0255 01/05/16 0742 01/06/16 0521  WBC 5.7 3.1* 13.7*  HGB 11.5* 12.8* 13.1  HCT 37.4* 39.2 41.0  PLT 183 162 197   BMET  Recent Labs  01/04/16 0255 01/05/16 0742  01/06/16 0521  NA 136 138 137  K 4.1 4.5 5.0  CL 104 99* 101  CO2 25 24 25   GLUCOSE 138* 140* 166*  BUN 14 17 24*  CREATININE 1.25* 1.33* 1.27*  CALCIUM 8.5* 9.2 9.2   PT/INR  Recent Labs  01/05/16 1308  LABPROT 17.4*  INR 1.41   Cholesterol  Recent Labs  01/03/16 1128  CHOL 120   Cardiac Panel (last 3 results)  Recent Labs  01/03/16 1128 01/03/16 1638 01/03/16 2244  TROPONINI 0.49* 0.30* 0.42*    Studies/Results: R/LHC 01/05/16 Coronary Findings    Dominance: Left   Left Main  Vessel was injected. Vessel is normal in caliber. Vessel is angiographically normal.     Left Anterior Descending  Vessel was injected. Vessel is normal in caliber. Vessel is angiographically normal.   . Second Diagonal Branch   The vessel is small in size.     Left Circumflex  Vessel was injected. Vessel is normal in caliber. Vessel is angiographically normal.     Right Coronary Artery  Vessel was injected. Vessel is normal in caliber. Vessel is angiographically normal.       Right Heart Pressures Hemodynamic findings consistent with moderate pulmonary hypertension. Elevated LV EDP consistent with volume overload. RA: 19/14; mean 13 RV: 46/19 PA: 45/25; mean 33 PW: A wave 23, V-wave 19, mean 18.  AO: 98/74 PA: 43/21  LV: 97/12/29 PW: 27/21; Mean 22  LV: 97/14/25 AO: 95/73  Oxygen saturation: AO 91%; PA 41%  Cardiac output: 3.13 (Fick); 5.6 L/m (thermodilution) Cardiac index: 1.53 2.75 liters per minute per meter squared      Assessment/Plan  Principal Problem:   Chest pain, pleuritic Active Problems:   Tobacco abuse   Asthma   Leg swelling   Tachycardia   Normocytic anemia   Kidney disease   Acute CHF (congestive heart failure) (HCC)   Elevated troponin   Right rib fracture   Suspected pulmonary embolism (HCC)   Chest pain   1. Dilated congestive nonischemic cardiomyopathy: LHC 01/05/16 showed normal coronaries and elevation of right heart  pressures with moderate pulmonary hypertension. EF 20-25%. Recommendations are for aggressive medical therapy with lifestyle adjustment for his dilated cardIomyopathy.Recommend management with a BB and ACE/ARB + diuretic for volume. Would opt for Coreg for BB given h/o cocaine use. Can consider addition of Aldactone, hydralazine + nitrates as an outpatient, if renal function and BP allows. Can up titrate meds in clinic. If LV function does not improve, consider evaluation for prophylactic ICD implantation. We discussed importance of low  sodium diet.   2. Poor Access to Healthcare: patient notes limited resources. Recently was released from prison, no PCP and no insurance. Recommend case manager/ SW to assist with medication needs and OP f/u.    LOS: 2 days    Brittainy M. Delmer Islam 01/06/2016 6:42 AM  I have personally seen and examined this patient with Robbie Lis, PA-C. I agree with the assessment and plan as outlined above. He has a non-ischemic cardiomyopathy. This is likely due to cocaine abuse. A component of his presentation with dyspnea is likely COPD/asthma related as there was a dramatic response to steroids. In regards to his cardiomyopathy, would use Coreg 3.125 mg po BID, Lisinopril 5 mg daily and Lasix 40 mg daily. His wedge pressure was elevated at time of cath yesterday suggesting volume overload. He will need close follow up in our office post discharge. He is asked to stop using cocaine and etoh. He will need to be connected with the [facility] financial assistant department for the [employer] and be set up in the [employer]. OK to d/c home today from a cardiac standpoint.   Verne Carrow 01/06/2016 9:00 AM

## 2016-01-06 NOTE — Telephone Encounter (Signed)
Attempted to contact the patient to notify him that an appointment has been scheduled for him at [facility] on 01/19/16 @ 1145. Call placed to # [phone] (M) and a HIPAA compliant voice mail message was left requesting a call back to # [phone] or [phone].    Update provided to Letha Cape, RN CM.

## 2016-01-06 NOTE — Progress Notes (Signed)
Ambulated 100 ft on RA , O2 sat 96-99%.  HR 106-120 denies any discomforts

## 2016-01-07 ENCOUNTER — Telehealth: Payer: Self-pay

## 2016-01-07 ENCOUNTER — Telehealth: Payer: Self-pay | Admitting: Cardiovascular Disease

## 2016-01-07 MED ORDER — ALBUTEROL SULFATE HFA 108 (90 BASE) MCG/ACT IN AERS: 1.0000 | INHALATION_SPRAY | Freq: Four times a day (QID) | RESPIRATORY_TRACT | Status: DC | PRN

## 2016-01-07 NOTE — Telephone Encounter (Signed)
Spoke with pt's wife.  She is calling to have prescription for inhaler sent to [facility].  This medication is listed on his med list as taking prior to admission and to continue upon discharge. He was seen by internal medicine in hospital and prescription was not sent in upon discharge. Wife reports pt was not taking this prior to admission and did not use during the hospital. He was given an inhaler once when seen in the ED but a prescription was never sent in.  Wife reports pt was very short of breath during the night. Had to sleep propped up on pillows. Took prednisone and breathing improved some.  Today breathing has improved some but he is still having shortness of breath and wheezing.  Will forward to Dr. Clifton James for review/recommendations and to see if OK to refill inhaler. Pt is scheduled to see [facility] [facility] later this month.  I instructed wife if symptoms worsen pt should go to ED for evaluation

## 2016-01-07 NOTE — Telephone Encounter (Signed)
This Case Manager placed call to patient to inform him that a hospital follow-up appointment has been scheduled for him on 01/19/16 at 1145 with Dr. Venetia Night at [facility] and [facility] Call placed to #[phone]; unable to reach patient. HIPPA compliant voicemail left requesting return call.

## 2016-01-07 NOTE — Telephone Encounter (Signed)
Left message to call back  

## 2016-01-07 NOTE — Telephone Encounter (Signed)
F/u  Pt returning RN phone call. Please call back and discuss.   

## 2016-01-07 NOTE — Addendum Note (Signed)
Addended by: Dossie Arbour on: 01/07/2016 03:29 PM   Modules accepted: Orders

## 2016-01-07 NOTE — Telephone Encounter (Signed)
New Message  Pt c/o medication issue: 1. Name of Medication: albuterol (PROVENTIL HFA;VENTOLIN HFA) 108 (90 BASE) MCG/ACT inhaler    4. What is your medication issue?  Pt called was recently seen in the ED by Dr. Clifton James. Pt states that during ov provider stated that the y would prescribe albuterol (PROVENTIL HFA;VENTOLIN HFA) 108 (90 BASE) MCG/ACT inhaler as needed. Pt states that he has had trouble breathing   Pt c/o Shortness Of Breath: STAT if SOB developed within the last 24 hours or pt is noticeably SOB on the phone  1. Are you currently SOB (can you hear that pt is SOB on the phone)? Yes ( cant hear it over the phone, Pt is always having it its something that never stops. Last night really bad breathing took prednisone this morning.   2. How long have you been experiencing SOB? Gotten worse within the last 1-2 weeks that's why he was in the hospital   3. Are you SOB when sitting or when up moving around? Both   4.  Are you currently experiencing any other symptoms? No   Comments: Made [facility] hospital appt for 01/11/2016 with [facility] .

## 2016-01-07 NOTE — Telephone Encounter (Signed)
I spoke with pt's wife and gave her information from Dr. Clifton James. Prescription sent to [facility] on [address].

## 2016-01-07 NOTE — Telephone Encounter (Signed)
We can refill his inhaler x 1. He needs to establish with primary care for refills. He needs to go into the urgent care if condition worsens. cdm

## 2016-01-08 ENCOUNTER — Telehealth: Payer: Self-pay

## 2016-01-08 NOTE — Telephone Encounter (Signed)
This Case Manager placed an additional call to patient to inform him that a hospital follow-up appointment has been scheduled for him on 01/19/16 at 1145 with Dr. Venetia Night at [facility] and [facility]. Call placed to #[phone]; unable to reach patient. An additional HIPPA compliant voicemail left requesting return call.

## 2016-01-11 ENCOUNTER — Encounter: Payer: Self-pay | Admitting: Physician Assistant

## 2016-01-11 DIAGNOSIS — N182 Chronic kidney disease, stage 2 (mild): Secondary | ICD-10-CM | POA: Insufficient documentation

## 2016-01-11 DIAGNOSIS — F1410 Cocaine abuse, uncomplicated: Secondary | ICD-10-CM | POA: Insufficient documentation

## 2016-01-11 DIAGNOSIS — I428 Other cardiomyopathies: Secondary | ICD-10-CM | POA: Insufficient documentation

## 2016-01-11 DIAGNOSIS — I5042 Chronic combined systolic (congestive) and diastolic (congestive) heart failure: Secondary | ICD-10-CM | POA: Insufficient documentation

## 2016-01-11 DIAGNOSIS — I5022 Chronic systolic (congestive) heart failure: Secondary | ICD-10-CM

## 2016-01-11 NOTE — Progress Notes (Signed)
Cardiology Office Note:    Date:  01/11/2016   ID:  NIKHIL OPSAHL, DOB 05/27/1976, MRN [id number]  PCP:  No PCP Per Patient  Cardiologist:    Electrophysiologist:    Referring MD: No ref. provider found   No chief complaint on file.   History of Present Illness:     Mark [name] is a 40 y.o. male with a hx of asthma, tobacco abuse, polysubstance abuse (marijuana, cocaine).    Admitted 5/14-5/17 acute systolic HF and chest pain.  CEs were minimally elevated without clear trend.  DDimer was also noted to be elevated.  UDS was positive for cocaine.  CTA was inconclusive and VQ scan was low probability for PE.  Echo showed EF 20-25%.  He was diuresed and seen by Dr. Clifton James for cardiology.  R/L heart cath demonstrated no CAD and elevation of R heart pressures with mod pulmonary HTN.  Carvedilol was initiated to avoid unopposed alpha stimulation with hx of cocaine abuse.  He was asked to DC cocaine.  He was also placed on Lisinopril and Lasix for his NICM.  Case management assisted with medications/establishment with [facility].  He returns for FU.      Past Medical History  Diagnosis Date  . Asthma   . Chronic systolic CHF (congestive heart failure) (HCC)   . NICM (nonischemic cardiomyopathy) (HCC)   . Hx of cardiac cath     a. LHC 5/17 - normal coronary arteries. PA 45/25, mean 33, PCWP mean 18  . History of echocardiogram     a. Echo 5/17 - EF 20-25%, severe diff HK, restrictive physiology, mild to mod MR, severe reduced RVSF, mod RVE, mild RAE, mod TR, PASP 48 mmHg  . Substance abuse     cocaine, marijuana  . Cigarette smoker     Past Surgical History  Procedure Laterality Date  . None    . Cardiac catheterization N/A 01/05/2016    Procedure: Right/Left Heart Cath and Coronary Angiography;  Surgeon: Lennette Bihari, MD;  Location: [facility] INVASIVE CV LAB;  Service: Cardiovascular;  Laterality: N/A;    Current Medications: Outpatient Prescriptions Prior to Visit  Medication Sig  Dispense Refill  . albuterol (PROVENTIL HFA;VENTOLIN HFA) 108 (90 Base) MCG/ACT inhaler Inhale 1-2 puffs into the lungs every 6 (six) hours as needed for wheezing or shortness of breath. 1 Inhaler 0  . aspirin 81 MG EC tablet Take 1 tablet (81 mg total) by mouth daily. 30 tablet 0  . carvedilol (COREG) 3.125 MG tablet Take 1 tablet (3.125 mg total) by mouth 2 (two) times daily with a meal. 60 tablet 1  . furosemide (LASIX) 40 MG tablet Take 1 tablet (40 mg total) by mouth daily. 30 tablet 2  . guaiFENesin-dextromethorphan (ROBITUSSIN DM) 100-10 MG/5ML syrup Take 5 mLs by mouth every 4 (four) hours as needed for cough. 118 mL 0  . lisinopril (PRINIVIL,ZESTRIL) 5 MG tablet Take 1 tablet (5 mg total) by mouth daily. 30 tablet 2  . predniSONE (STERAPRED UNI-PAK 21 TAB) 10 MG (21) TBPK tablet Take 1 tablet (10 mg total) by mouth daily. As per  dose pack instructions 21 tablet 0   No facility-administered medications prior to visit.      Allergies:   Hydrocodone and Penicillins   Social History   Social History  . Marital Status: Married    Spouse Name: N/A  . Number of Children: N/A  . Years of Education: N/A   Social History Main Topics  .  Smoking status: Current Every Day Smoker -- 0.50 packs/day for 20 years    Types: Cigarettes  . Smokeless tobacco: Never Used  . Alcohol Use: No  . Drug Use: Yes    Special: Cocaine, Marijuana  . Sexual Activity: Not on file   Other Topics Concern  . Not on file   Social History Narrative     Family History:  The patient's family history includes Cardiomyopathy in his father; Heart failure in his father; Hypertension in his father. There is no history of Deep vein thrombosis.   ROS:   Please see the history of present illness.    ROS All other systems reviewed and are negative.   Physical Exam:    VS:  There were no vitals taken for this visit.   GEN: Well nourished, well developed, in no acute distress HEENT: normal Neck: no JVD, no  masses Cardiac: Normal S1/S2, RRR; no murmurs, rubs, or gallops, no edema;   carotid bruits,   Respiratory:  clear to auscultation bilaterally; no wheezing, rhonchi or rales GI: soft, nontender, nondistended MS: no deformity or atrophy Skin: warm and dry Neuro: No focal deficits  Psych: Alert and oriented x 3, normal affect  Wt Readings from Last 3 Encounters:  01/06/16 192 lb 0.3 oz (87.1 kg)      Studies/Labs Reviewed:     EKG:  EKG is  ordered today.  The ekg ordered today demonstrates   Recent Labs: 01/03/2016: B Natriuretic Peptide 1315.7*; TSH 1.425 01/04/2016: Magnesium 1.6* 01/06/2016: ALT 95*; BUN 24*; Creatinine, Ser 1.27*; Hemoglobin 13.1; Platelets 197; Potassium 5.0; Sodium 137   Recent Lipid Panel    Component Value Date/Time   CHOL 120 01/03/2016 1128   TRIG 118 01/03/2016 1128   HDL 30* 01/03/2016 1128   CHOLHDL 4.0 01/03/2016 1128   VLDL 24 01/03/2016 1128   LDLCALC 66 01/03/2016 1128    Additional studies/ records that were reviewed today include:   LE venous duplex 01/03/16 Bilateral LE neg for DVT  Echo 01/03/16 EF 20-25%, severe diff HK, restrictive physiology, mild to mod MR, severe reduced RVSF, mod RVE, mild RAE, mod TR, PASP 48 mmHg  R/L HC 01/05/16 Normal coronary arteries PA 45/25, mean 33 Mean PWCP 18 Dilated congestive nonischemic cardiomyopathy with echo Doppler assessment of EF at 20 - 25%. Elevation of right heart pressures with moderate pulmonary hypertension. Normal coronary arteries.   ASSESSMENT:     1. Chronic systolic CHF (congestive heart failure) (HCC)   2. NICM (nonischemic cardiomyopathy) (HCC)   3. Asthma, mild intermittent, uncomplicated   4. CKD (chronic kidney disease) stage 2, GFR 60-89 ml/min   5. Cocaine abuse     PLAN:     In order of problems listed above:  1.    Medication Adjustments/Labs and Tests Ordered: Current medicines are reviewed at length with the patient today.  Concerns regarding medicines  are outlined above.  Medication changes, Labs and Tests ordered today are outlined in the Patient Instructions noted below. There are no Patient Instructions on file for this visit. Signed, Tereso Newcomer, PA-C  01/11/2016 2:24 PM    [facility] [address] [place], [place], [place]  [address] Phone: [phone]; Fax: [phone]     This encounter was created in error - please disregard.

## 2016-01-13 ENCOUNTER — Telehealth: Payer: Self-pay

## 2016-01-13 NOTE — Telephone Encounter (Signed)
This Case Manager placed call to patient to patient to inform that a hospital follow-up appointment has been scheduled for him on 01/19/16 at 1145 with Dr. Venetia Night at [facility] and [facility]. Patient verbalized understanding and indicated his spouse knew where clinic was located. No additional needs identified.

## 2016-01-15 ENCOUNTER — Encounter: Payer: Self-pay | Admitting: Physician Assistant

## 2016-01-19 ENCOUNTER — Inpatient Hospital Stay: Payer: Self-pay | Admitting: Family Medicine

## 2016-01-22 ENCOUNTER — Telehealth: Payer: Self-pay

## 2016-01-22 NOTE — Telephone Encounter (Signed)
Patient's spouse called into clinic concerned about her spouse. Patient's spouse indicated patient was put on lisinopril, carvedilol, lasix, aspirin, prednisone when discharged from hospital on 01/06/16 for chest pain. Patient's spouse indicated she noticed yesterday when she came home from work patient's lips were "huge and swollen." Patient's spouse indicated she also noticed patient was wheezing. This Case Manager asked to speak with patient and date of birth confirmed. Informed patient to stop taking his medications and get immediately to ED. Informed patient that he may be experiencing a side effect called angioedema which is serious and can be life threatening. Explained angioedema to patient. Patient listened to instructions but spouse said he does not want to go to ED. Instructed patient's spouse on importance of patient getting immediately to ED for evaluation, and she indicated she would try to talk patient into going to ED. Again, stressed that this could be a serious side effect and stressed importance of getting patient to ED with patient's spouse. Patient's spouse verbalized understanding.

## 2016-01-28 ENCOUNTER — Inpatient Hospital Stay: Payer: Self-pay | Admitting: Family Medicine

## 2016-02-02 ENCOUNTER — Inpatient Hospital Stay: Payer: Self-pay | Admitting: Family Medicine

## 2016-02-07 ENCOUNTER — Other Ambulatory Visit: Payer: Self-pay

## 2016-02-07 ENCOUNTER — Observation Stay (HOSPITAL_COMMUNITY)
Admission: EM | Admit: 2016-02-07 | Discharge: 2016-02-08 | Disposition: A | Discharge status: Home / Self Care | Payer: Medicaid Other | Attending: Student in an Organized Health Care Education/Training Program | Admitting: Student in an Organized Health Care Education/Training Program

## 2016-02-07 ENCOUNTER — Encounter (HOSPITAL_COMMUNITY): Payer: Self-pay | Admitting: Physical Medicine and Rehabilitation

## 2016-02-07 ENCOUNTER — Emergency Department (HOSPITAL_COMMUNITY): Payer: Medicaid Other

## 2016-02-07 DIAGNOSIS — R0602 Shortness of breath: Secondary | ICD-10-CM | POA: Diagnosis present

## 2016-02-07 DIAGNOSIS — J4541 Moderate persistent asthma with (acute) exacerbation: Principal | ICD-10-CM | POA: Insufficient documentation

## 2016-02-07 DIAGNOSIS — Z79899 Other long term (current) drug therapy: Secondary | ICD-10-CM | POA: Diagnosis not present

## 2016-02-07 DIAGNOSIS — I5022 Chronic systolic (congestive) heart failure: Secondary | ICD-10-CM

## 2016-02-07 DIAGNOSIS — S2231XA Fracture of one rib, right side, initial encounter for closed fracture: Secondary | ICD-10-CM | POA: Diagnosis not present

## 2016-02-07 DIAGNOSIS — N179 Acute kidney failure, unspecified: Secondary | ICD-10-CM | POA: Insufficient documentation

## 2016-02-07 DIAGNOSIS — R748 Abnormal levels of other serum enzymes: Secondary | ICD-10-CM | POA: Insufficient documentation

## 2016-02-07 DIAGNOSIS — I429 Cardiomyopathy, unspecified: Secondary | ICD-10-CM

## 2016-02-07 DIAGNOSIS — E871 Hypo-osmolality and hyponatremia: Secondary | ICD-10-CM | POA: Diagnosis not present

## 2016-02-07 DIAGNOSIS — J45901 Unspecified asthma with (acute) exacerbation: Secondary | ICD-10-CM

## 2016-02-07 DIAGNOSIS — F1220 Cannabis dependence, uncomplicated: Secondary | ICD-10-CM

## 2016-02-07 DIAGNOSIS — F17210 Nicotine dependence, cigarettes, uncomplicated: Secondary | ICD-10-CM | POA: Diagnosis not present

## 2016-02-07 DIAGNOSIS — F1410 Cocaine abuse, uncomplicated: Secondary | ICD-10-CM | POA: Diagnosis not present

## 2016-02-07 DIAGNOSIS — W19XXXD Unspecified fall, subsequent encounter: Secondary | ICD-10-CM

## 2016-02-07 DIAGNOSIS — F1210 Cannabis abuse, uncomplicated: Secondary | ICD-10-CM | POA: Insufficient documentation

## 2016-02-07 DIAGNOSIS — Z7982 Long term (current) use of aspirin: Secondary | ICD-10-CM | POA: Insufficient documentation

## 2016-02-07 DIAGNOSIS — R7303 Prediabetes: Secondary | ICD-10-CM

## 2016-02-07 DIAGNOSIS — I509 Heart failure, unspecified: Secondary | ICD-10-CM

## 2016-02-07 DIAGNOSIS — S2241XD Multiple fractures of ribs, right side, subsequent encounter for fracture with routine healing: Secondary | ICD-10-CM

## 2016-02-07 DIAGNOSIS — R739 Hyperglycemia, unspecified: Secondary | ICD-10-CM | POA: Insufficient documentation

## 2016-02-07 DIAGNOSIS — I428 Other cardiomyopathies: Secondary | ICD-10-CM

## 2016-02-07 DIAGNOSIS — R7989 Other specified abnormal findings of blood chemistry: Secondary | ICD-10-CM

## 2016-02-07 DIAGNOSIS — R778 Other specified abnormalities of plasma proteins: Secondary | ICD-10-CM

## 2016-02-07 DIAGNOSIS — W19XXXA Unspecified fall, initial encounter: Secondary | ICD-10-CM | POA: Insufficient documentation

## 2016-02-07 DIAGNOSIS — R718 Other abnormality of red blood cells: Secondary | ICD-10-CM

## 2016-02-07 DIAGNOSIS — Z720 Tobacco use: Secondary | ICD-10-CM | POA: Diagnosis present

## 2016-02-07 LAB — RAPID URINE DRUG SCREEN, HOSP PERFORMED
AMPHETAMINES: NOT DETECTED
Barbiturates: NOT DETECTED
Benzodiazepines: NOT DETECTED
COCAINE: POSITIVE — AB
OPIATES: NOT DETECTED
TETRAHYDROCANNABINOL: NOT DETECTED

## 2016-02-07 LAB — I-STAT VENOUS BLOOD GAS, ED
Acid-Base Excess: 4 mmol/L — ABNORMAL HIGH (ref 0.0–2.0)
Bicarbonate: 26.7 mEq/L — ABNORMAL HIGH (ref 20.0–24.0)
O2 Saturation: 88 %
PH VEN: 7.516 — AB (ref 7.250–7.300)
TCO2: 28 mmol/L (ref 0–100)
pCO2, Ven: 33 mmHg — ABNORMAL LOW (ref 45.0–50.0)
pO2, Ven: 49 mmHg — ABNORMAL HIGH (ref 31.0–45.0)

## 2016-02-07 LAB — CBC WITH DIFFERENTIAL/PLATELET
Basophils Absolute: 0.1 10*3/uL (ref 0.0–0.1)
Basophils Relative: 1 %
EOS PCT: 0 %
Eosinophils Absolute: 0 10*3/uL (ref 0.0–0.7)
HEMATOCRIT: 41.6 % (ref 39.0–52.0)
Hemoglobin: 13.2 g/dL (ref 13.0–17.0)
LYMPHS ABS: 3 10*3/uL (ref 0.7–4.0)
Lymphocytes Relative: 47 %
MCH: 24.2 pg — AB (ref 26.0–34.0)
MCHC: 31.7 g/dL (ref 30.0–36.0)
MCV: 76.2 fL — AB (ref 78.0–100.0)
MONOS PCT: 12 %
Monocytes Absolute: 0.8 10*3/uL (ref 0.1–1.0)
NEUTROS ABS: 2.6 10*3/uL (ref 1.7–7.7)
Neutrophils Relative %: 40 %
Platelets: 184 10*3/uL (ref 150–400)
RBC: 5.46 MIL/uL (ref 4.22–5.81)
RDW: 13.8 % (ref 11.5–15.5)
WBC: 6.5 10*3/uL (ref 4.0–10.5)

## 2016-02-07 LAB — TROPONIN I
TROPONIN I: 0.09 ng/mL — AB (ref <0.031)
Troponin I: 0.1 ng/mL — ABNORMAL HIGH (ref <0.031)
Troponin I: 0.1 ng/mL — ABNORMAL HIGH (ref <0.031)

## 2016-02-07 LAB — COMPREHENSIVE METABOLIC PANEL
ALT: 17 U/L (ref 17–63)
ANION GAP: 9 (ref 5–15)
AST: 31 U/L (ref 15–41)
Albumin: 3.6 g/dL (ref 3.5–5.0)
Alkaline Phosphatase: 89 U/L (ref 38–126)
BILIRUBIN TOTAL: 1.1 mg/dL (ref 0.3–1.2)
BUN: 14 mg/dL (ref 6–20)
CO2: 22 mmol/L (ref 22–32)
Calcium: 9.1 mg/dL (ref 8.9–10.3)
Chloride: 98 mmol/L — ABNORMAL LOW (ref 101–111)
Creatinine, Ser: 1.7 mg/dL — ABNORMAL HIGH (ref 0.61–1.24)
GFR calc Af Amer: 56 mL/min — ABNORMAL LOW (ref >60)
GFR, EST NON AFRICAN AMERICAN: 48 mL/min — AB (ref >60)
Glucose, Bld: 115 mg/dL — ABNORMAL HIGH (ref 65–99)
POTASSIUM: 4.3 mmol/L (ref 3.5–5.1)
Sodium: 129 mmol/L — ABNORMAL LOW (ref 135–145)
TOTAL PROTEIN: 7.1 g/dL (ref 6.5–8.1)

## 2016-02-07 LAB — FERRITIN: Ferritin: 124 ng/mL (ref 24–336)

## 2016-02-07 LAB — MAGNESIUM: MAGNESIUM: 1.7 mg/dL (ref 1.7–2.4)

## 2016-02-07 LAB — I-STAT TROPONIN, ED: Troponin i, poc: 0.17 ng/mL (ref 0.00–0.08)

## 2016-02-07 LAB — BRAIN NATRIURETIC PEPTIDE: B Natriuretic Peptide: 1519.8 pg/mL — ABNORMAL HIGH (ref 0.0–100.0)

## 2016-02-07 LAB — I-STAT CG4 LACTIC ACID, ED: LACTIC ACID, VENOUS: 1.44 mmol/L (ref 0.5–2.0)

## 2016-02-07 MED ORDER — EPINEPHRINE 0.3 MG/0.3ML IJ SOAJ
0.3000 mg | Freq: Once | INTRAMUSCULAR | Status: DC
  Filled 2016-02-07: qty 0.3

## 2016-02-07 MED ORDER — IPRATROPIUM-ALBUTEROL 0.5-2.5 (3) MG/3ML IN SOLN
3.0000 mL | RESPIRATORY_TRACT | Status: DC
  Administered 2016-02-07 (×2): 3 mL via RESPIRATORY_TRACT
  Filled 2016-02-07 (×2): qty 3

## 2016-02-07 MED ORDER — ALBUTEROL SULFATE (2.5 MG/3ML) 0.083% IN NEBU: 3.0000 mL | INHALATION_SOLUTION | Freq: Four times a day (QID) | RESPIRATORY_TRACT | Status: DC | PRN

## 2016-02-07 MED ORDER — FUROSEMIDE 10 MG/ML IJ SOLN
40.0000 mg | Freq: Once | INTRAMUSCULAR | Status: AC
  Administered 2016-02-07: 40 mg via INTRAVENOUS
  Filled 2016-02-07: qty 4

## 2016-02-07 MED ORDER — ASPIRIN EC 81 MG PO TBEC
81.0000 mg | DELAYED_RELEASE_TABLET | Freq: Every day | ORAL | Status: DC
  Administered 2016-02-08: 81 mg via ORAL
  Filled 2016-02-07: qty 1

## 2016-02-07 MED ORDER — ENOXAPARIN SODIUM 40 MG/0.4ML ~~LOC~~ SOLN
40.0000 mg | SUBCUTANEOUS | Status: DC
  Administered 2016-02-07: 40 mg via SUBCUTANEOUS
  Filled 2016-02-07: qty 0.4

## 2016-02-07 MED ORDER — METHYLPREDNISOLONE SODIUM SUCC 125 MG IJ SOLR
125.0000 mg | Freq: Once | INTRAMUSCULAR | Status: AC
  Administered 2016-02-07: 125 mg via INTRAVENOUS
  Filled 2016-02-07: qty 2

## 2016-02-07 MED ORDER — DICLOFENAC SODIUM 1 % TD GEL
2.0000 g | Freq: Four times a day (QID) | TRANSDERMAL | Status: DC
  Administered 2016-02-07 – 2016-02-08 (×5): 2 g via TOPICAL
  Filled 2016-02-07: qty 100

## 2016-02-07 MED ORDER — IPRATROPIUM BROMIDE 0.02 % IN SOLN
1.0000 mg | Freq: Once | RESPIRATORY_TRACT | Status: AC
  Administered 2016-02-07: 1 mg via RESPIRATORY_TRACT

## 2016-02-07 MED ORDER — ALBUTEROL (5 MG/ML) CONTINUOUS INHALATION SOLN
10.0000 mg/h | INHALATION_SOLUTION | Freq: Once | RESPIRATORY_TRACT | Status: AC
  Administered 2016-02-07: 10 mg/h via RESPIRATORY_TRACT

## 2016-02-07 MED ORDER — MAGNESIUM SULFATE 2 GM/50ML IV SOLN
2.0000 g | Freq: Once | INTRAVENOUS | Status: AC
  Administered 2016-02-07: 2 g via INTRAVENOUS
  Filled 2016-02-07: qty 50

## 2016-02-07 MED ORDER — ASPIRIN 81 MG PO CHEW
324.0000 mg | CHEWABLE_TABLET | Freq: Once | ORAL | Status: AC
  Administered 2016-02-07: 324 mg via ORAL
  Filled 2016-02-07: qty 4

## 2016-02-07 MED ORDER — ALBUTEROL (5 MG/ML) CONTINUOUS INHALATION SOLN
INHALATION_SOLUTION | RESPIRATORY_TRACT | Status: AC
  Filled 2016-02-07: qty 20

## 2016-02-07 MED ORDER — CARVEDILOL 3.125 MG PO TABS
3.1250 mg | ORAL_TABLET | Freq: Two times a day (BID) | ORAL | Status: DC
  Administered 2016-02-07 – 2016-02-08 (×2): 3.125 mg via ORAL
  Filled 2016-02-07 (×2): qty 1

## 2016-02-07 MED ORDER — IPRATROPIUM-ALBUTEROL 0.5-2.5 (3) MG/3ML IN SOLN
3.0000 mL | Freq: Four times a day (QID) | RESPIRATORY_TRACT | Status: DC
  Administered 2016-02-08: 3 mL via RESPIRATORY_TRACT
  Filled 2016-02-07: qty 3

## 2016-02-07 MED ORDER — BENZONATATE 100 MG PO CAPS
100.0000 mg | ORAL_CAPSULE | Freq: Three times a day (TID) | ORAL | Status: DC | PRN
Start: 2016-02-07 — End: 2016-02-08
  Administered 2016-02-07 – 2016-02-08 (×3): 100 mg via ORAL
  Filled 2016-02-07 (×3): qty 1

## 2016-02-07 MED ORDER — PREDNISONE 20 MG PO TABS
40.0000 mg | ORAL_TABLET | Freq: Every day | ORAL | Status: DC
  Administered 2016-02-08: 40 mg via ORAL
  Filled 2016-02-07: qty 2

## 2016-02-07 MED ORDER — IPRATROPIUM BROMIDE 0.02 % IN SOLN
RESPIRATORY_TRACT | Status: AC
  Filled 2016-02-07: qty 5

## 2016-02-07 NOTE — ED Notes (Signed)
Pt reports taking first dose of lisinopril yesterday. Now reports cough, SOB and feeling of throat closing. Respirations labored upon arrival to exam room.

## 2016-02-07 NOTE — Progress Notes (Signed)
Patient arrived in the unit accompanied by NT via stretcher. Orientation to the unit given. Patient verbalizes understanding. 

## 2016-02-07 NOTE — H&P (Signed)
Date: 02/07/2016               Patient Name:  Mark [name] MRN: [id number]  DOB: 29-Aug-1975 Age / Sex: 40 y.o., male   PCP: No Pcp Per Patient         Medical Service: Internal Medicine Teaching Service         Attending Physician: Dr. Tyson Alias, MD    First Contact: Dr. Gwynn Burly Pager: [phone]  Second Contact: Dr. Gara Kroner Pager: [phone]       After Hours (After 5p/  First Contact Pager: [phone]  weekends / holidays): Second Contact Pager: [phone]   Chief Complaint: shortness of breath, dry cough  History of Present Illness: [name] is a 40 y.o. man with PMHx of asthma, substance abuse (marijuana and cocaine), NICM who presents to the ED with worsening cough and shortness of breath.    Patient was recently hospitalized and discharged Jan 06, 2016 for acute CHF where he underwent  RHC/LHC with normal coronary arteries, dilated congestive nonischemic cardiomyopathy and Echo Doppler assessment of EF at 20 - 25%.  He was discharged to home with aggressive medical management and outpatient follow-up.  Since discharge, reports taking his carvedilol, Lasix, and Aspirin as prescribed.  However, he does report running out of his Lasix 2 days ago.  He stopped taking Lisinopril 2 weeks ago after developing what appeared to be a angioedema-like reaction, but he did not seek medical attention at the time.   During this previous admission, patient also presented with right-sided chest pain and tachycardia that prompted evaluation for pulmonary embolus.  CTA PE study was not optimal.  Subsequently, a VQ scan was done and found to be low probability for PE.  He was also found to have right-sided rib fractures due to a fall that were likely contributing to his pain.    Patient presents this morning for dyspnea and cough.  He states that last night his coughing and SOB worsened despite using his albuterol and fluticasone.  He reports a persistent, dry cough for 2 months  now with continued right-sided pain described as aching, worse with cough, and rated 8/10.   He uses ibuprofen for his pain.  He reports nothing made his SOB better except for supplemental oxygen.  He denies any chest pain, nausea, recent lower extremity edema, pleuritic pain.  He is unsure if he has had a change in his weight from his recent discharge.  He reports stable 3 pillow orthopnea, SOB with minimal activity.  Otherwise, 10-point review of systems was negative except for a one-time episode of non-bloody non-bilious emesis.    Social Hx: reports daily marijuana use, smokes 1-2 cigarettes per day but had been smoking up to 1 PPD x greater than 20 years until recently, drinks EtOH socially, reports last used cocaine over 1 month ago (reported to ED provider using cocaine this week).  He is unemployed, married, and has 3 children.  Meds: Current Facility-Administered Medications  Medication Dose Route Frequency Provider Last Rate Last Dose  . albuterol (PROVENTIL) (2.5 MG/3ML) 0.083% nebulizer solution 3 mL  3 mL Inhalation Q6H PRN Denton Brick, MD      . albuterol (PROVENTIL, VENTOLIN) (5 MG/ML) 0.5% continuous inhalation solution        Stopped at 02/07/16 0907  . aspirin EC tablet 81 mg  81 mg Oral Daily Denton Brick, MD   81 mg at 02/07/16 1051  . carvedilol (COREG)  tablet 3.125 mg  3.125 mg Oral BID WC Denton Brick, MD      . diclofenac sodium (VOLTAREN) 1 % transdermal gel 2 g  2 g Topical QID Denton Brick, MD      . enoxaparin (LOVENOX) injection 40 mg  40 mg Subcutaneous Q24H Denton Brick, MD      . ipratropium (ATROVENT) 0.02 % nebulizer solution        Stopped at 02/07/16 0908  . ipratropium-albuterol (DUONEB) 0.5-2.5 (3) MG/3ML nebulizer solution 3 mL  3 mL Nebulization Q4H Denton Brick, MD      . Melene Muller ON 02/08/2016] predniSONE (DELTASONE) tablet 40 mg  40 mg Oral Q breakfast Denton Brick, MD        Allergies: Allergies as of 02/07/2016 - Review Complete 02/07/2016    Allergen Reaction Noted  . Hydrocodone Hives 11/29/2015  . Lisinopril Swelling 02/07/2016  . Penicillins Hives and Swelling 06/15/2015   Past Medical History  Diagnosis Date  . Asthma   . Chronic systolic CHF (congestive heart failure) (HCC)   . NICM (nonischemic cardiomyopathy) (HCC)   . Hx of cardiac cath     a. LHC 5/17 - normal coronary arteries. PA 45/25, mean 33, PCWP mean 18  . History of echocardiogram     a. Echo 5/17 - EF 20-25%, severe diff HK, restrictive physiology, mild to mod MR, severe reduced RVSF, mod RVE, mild RAE, mod TR, PASP 48 mmHg  . Substance abuse     cocaine, marijuana  . Cigarette smoker    Past Surgical History  Procedure Laterality Date  . None    . Cardiac catheterization N/A 01/05/2016    Procedure: Right/Left Heart Cath and Coronary Angiography;  Surgeon: Lennette Bihari, MD;  Location: [facility] INVASIVE CV LAB;  Service: Cardiovascular;  Laterality: N/A;   Family History  Problem Relation Age of Onset  . Deep vein thrombosis Neg Hx   . Cardiomyopathy Father     Reports his father has an LVAD  . Heart failure Father   . Hypertension Father    Social History   Social History  . Marital Status: Married    Spouse Name: N/A  . Number of Children: N/A  . Years of Education: N/A   Occupational History  . Not on file.   Social History Main Topics  . Smoking status: Current Every Day Smoker -- 0.50 packs/day for 20 years    Types: Cigarettes  . Smokeless tobacco: Never Used  . Alcohol Use: No  . Drug Use: Yes    Special: Cocaine, Marijuana  . Sexual Activity: Not on file   Other Topics Concern  . Not on file   Social History Narrative    Review of Systems: Pertinent items are noted in HPI.  Physical Exam: Blood pressure 104/70, pulse 103, temperature 98 F (36.7 C), temperature source Oral, resp. rate 18, height 5\' 9"  (1.753 m), weight 175 lb 3.2 oz (79.47 kg), SpO2 100 %. Physical Exam  Constitutional: He is oriented to person,  place, and time and well-developed, well-nourished, and in no distress.  Sitting up in bed, on 3L [place]  HENT:  Head: Normocephalic and atraumatic.  Eyes: EOM are normal.  Neck: Normal range of motion. No JVD present.  Cardiovascular: Regular rhythm and intact distal pulses.   tachycardic  Pulmonary/Chest: Effort normal. No respiratory distress. He has wheezes (anteriorly).  Diminished at bases, no crackles appreciated, decreased air movement overall  Musculoskeletal: He exhibits no  edema or tenderness (non-tender to palpation of right or left ribs).  Neurological: He is alert and oriented to person, place, and time.  Skin:  Multiple tattoos     Lab results: Basic Metabolic Panel:  Recent Labs  16/10/96 0909  NA 129*  K 4.3  CL 98*  CO2 22  GLUCOSE 115*  BUN 14  CREATININE 1.70*  CALCIUM 9.1  MG 1.7   Liver Function Tests:  Recent Labs  02/07/16 0909  AST 31  ALT 17  ALKPHOS 89  BILITOT 1.1  PROT 7.1  ALBUMIN 3.6   CBC:  Recent Labs  02/07/16 0909  WBC 6.5  NEUTROABS 2.6  HGB 13.2  HCT 41.6  MCV 76.2*  PLT 184   Urine Drug Screen: Drugs of Abuse     Component Value Date/Time   LABOPIA POSITIVE* 01/03/2016 1732   COCAINSCRNUR POSITIVE* 01/03/2016 1732   LABBENZ NONE DETECTED 01/03/2016 1732   AMPHETMU NONE DETECTED 01/03/2016 1732   THCU POSITIVE* 01/03/2016 1732   LABBARB NONE DETECTED 01/03/2016 1732     Imaging results:  Dg Chest 2 View  02/07/2016  CLINICAL DATA:  RIGHT-sided chest pain. Tobacco abuse. History of substance abuse. History of cardiomyopathy. EXAM: CHEST  2 VIEW COMPARISON:  01/04/2016. FINDINGS: Mild cardiac enlargement. No infiltrates or failure. Hyperinflation suggesting COPD. Healing RIGHT fourth rib fracture. IMPRESSION: No active disease.  Similar appearance to priors. Electronically Signed   By: Elsie Stain M.D.   On: 02/07/2016 09:50    Other results: EKG: sinus tachycardia with no significant changes from  prior  Assessment & Plan by Problem: Active Problems:   Tobacco abuse   Tachycardia   Elevated troponin   Right rib fracture   NICM (nonischemic cardiomyopathy) (HCC)   CHF (congestive heart failure) (HCC)  Dyspnea and Cough:  Likely multifactorial due to exacerbation of his HFrEF, asthma exacerbation, possible exacerbation of likely COPD given his extensive smoking history and radiographic findings, and potentially cocaine as a contributing factor.  He reports compliance with his beta blocker but recently ran out of his Lasix and stopped his Lisinopril 2 weeks ago.  He continues to smoke and only uses albuterol as needed for his asthma and likely COPD.  In the ED, his BNP was elevated to greater than 1500 suggesting a potential CHF exacerbation.  He had anterior wheezes and diminished air movement likely due to asthma exacerbation that reportedly improved with supplemental oxygen, continuous albuterol, and steroids.  CXR obtained did not show evidence of pneumonia and patient does not have a leukocytosis, fevers, or sputum production to suggest this.  Recent work-up for his pulmonary embolus is reassuring that this is also not the case. - continue albuterol nebs Q6H as needed - Duonebs QQ4H - start prednisone  daily tomorrow - consider Robitussin-DM if cough continues - peak flows - check UDS - recommend outpatient PFT's to better understand his asthma and presumed COPD - smoking, marijuana, and cocaine cessation  Elevated Troponin: likely secondary to strain with elevation to 0.17.  Last admission was elevated to 0.42.  EKG with no new ischemic changes and recent heart cath showed normal coronary arteries - trend troponin  Acute Kidney Injury: likely secondary to NSAID use for rib pain and poor perfusion from poor systolic function - follow BMET - avoid nephrotoxins - given Lasix  IV today.  Will hold off on further diuresis today and likely resume tomorrow  Non-Ischemic  Cardiomyopathy: severely decreased systolic function on Echo last month.  His weight last discharge was 192 and he has been adherent to his beta blocker, but recently ran out of Lasix and stopped Lisinopril about 2 weeks ago.  He appears euvolemic on exam with no peripheral edema and lungs sounds do not reveal any crackles.  Last admission, he only received 60mg  IV Lasix at maximum dose and was discharged on 40mg  PO - resume Lasix tomorrow as he already received 40mg  IV today - continue Carvedilol 3.125mg  BID - HOLD Lisinopril given his angioedema-like reaction and consider adding ARB to his regimen - continue Aspirin 81mg  daily - needs outpatient cardiology follow-up - daily weights - I/O's  Rib Fractures: CXR shows healing rib fractures from what patient describes as injuries from a fall last month - Voltaren gel - avoid NSAIDs given kidney function - avoid opiates with his substance abuse  Microcytosis: decreased MCV with normal hemoglobin and target cells seen.  Likely benign Thalassemia trait but will check a ferritin to rule out early iron deficiency - follow up ferritin, otherwise, continue to monitor  Hyperglycemia with Pre-DM: he has been persistently hyperglycemic over his last 2 admissions, but it is mild - recommend outpatient follow up, A1c last admission was found to be 6.3  Hyponatremia: mild, likely secondary to hypervolemia - continue to follow for now  Substance Abuse: patient reports to [place] that he last used cocaine over a month ago but reported to ED provider using this week - check UDS - CSW and CM consult  Health Maintenance - check HIV - check Hep C  DVT PPx: Lovenox SQ  Diet: Heart healthy  Code: FULL  Dispo: Disposition is deferred at this time, awaiting improvement of current medical problems. Anticipated discharge in approximately 1-2 day(s).   The patient does not have a current PCP (No Pcp Per Patient) and does need an [facility] hospital follow-up  appointment after discharge.  The patient does not have transportation limitations that hinder transportation to clinic appointments.  Signed: Gwynn Burly, DO 02/07/2016, 12:50 PM

## 2016-02-07 NOTE — ED Notes (Signed)
Attempted to call report x1. RN to call back.

## 2016-02-07 NOTE — ED Provider Notes (Signed)
CSN: [id number]     Arrival date & time 02/07/16  5277 History   First MD Initiated Contact with Patient 02/07/16 [phone]     Chief Complaint  Patient presents with  . Shortness of Breath     (Consider location/radiation/quality/duration/timing/severity/associated sxs/prior Treatment) HPI   Admitted to hospital in May for CHF Ran out of albuterol mdi, lasix and coreg 2 days ago, yesterday developed worsening dyspnea, cough. Shortness of breath and cough worse with laying down, better sitting up. Has not had lasix since Friday.  Took albuterol nebulizer. fluticasone, however continuing cough, progressive dyspnea. Feels like nebulizer made symptoms worse.   Also reports right flank/chest pain x mos, worse with coughing, had eval in hospital with ?RLL PE on limited CTA however VQ scan negative Pain unchanged  Used cocaine this week   Past Medical History  Diagnosis Date  . Asthma   . Chronic systolic CHF (congestive heart failure) (HCC)   . NICM (nonischemic cardiomyopathy) (HCC)   . Hx of cardiac cath     a. LHC 5/17 - normal coronary arteries. PA 45/25, mean 33, PCWP mean 18  . History of echocardiogram     a. Echo 5/17 - EF 20-25%, severe diff HK, restrictive physiology, mild to mod MR, severe reduced RVSF, mod RVE, mild RAE, mod TR, PASP 48 mmHg  . Substance abuse     cocaine, marijuana  . Cigarette smoker    Past Surgical History  Procedure Laterality Date  . None    . Cardiac catheterization N/A 01/05/2016    Procedure: Right/Left Heart Cath and Coronary Angiography;  Surgeon: Lennette Bihari, MD;  Location: [facility] INVASIVE CV LAB;  Service: Cardiovascular;  Laterality: N/A;   Family History  Problem Relation Age of Onset  . Deep vein thrombosis Neg Hx   . Cardiomyopathy Father     Reports his father has an LVAD  . Heart failure Father   . Hypertension Father    Social History  Substance Use Topics  . Smoking status: Current Every Day Smoker -- 0.50 packs/day for 20  years    Types: Cigarettes  . Smokeless tobacco: Never Used  . Alcohol Use: No    Review of Systems  Constitutional: Negative for fever.  HENT: Positive for rhinorrhea. Negative for sore throat.   Eyes: Negative for visual disturbance.  Respiratory: Positive for cough and shortness of breath.   Cardiovascular: Positive for chest pain (right side for one month). Negative for leg swelling.  Gastrointestinal: Positive for nausea and vomiting. Negative for abdominal pain.  Genitourinary: Negative for dysuria and difficulty urinating.  Musculoskeletal: Negative for back pain and neck stiffness.  Skin: Negative for rash.  Neurological: Negative for syncope and headaches.      Allergies  Hydrocodone; Lisinopril; and Penicillins  Home Medications   Prior to Admission medications   Medication Sig Start Date End Date Taking? Authorizing Provider  albuterol (PROVENTIL HFA;VENTOLIN HFA) 108 (90 Base) MCG/ACT inhaler Inhale 1-2 puffs into the lungs every 6 (six) hours as needed for wheezing or shortness of breath. 01/07/16  Yes Kathleene Hazel, MD  aspirin 81 MG EC tablet Take 1 tablet (81 mg total) by mouth daily. 01/06/16  Yes Richarda Overlie, MD  carvedilol (COREG) 3.125 MG tablet Take 1 tablet (3.125 mg total) by mouth 2 (two) times daily with a meal. 01/06/16  Yes Richarda Overlie, MD  furosemide (LASIX) 40 MG tablet Take 1 tablet (40 mg total) by mouth daily. 01/06/16  Yes Richarda Overlie,  MD  guaiFENesin-dextromethorphan (ROBITUSSIN DM) 100-10 MG/5ML syrup Take 5 mLs by mouth every 4 (four) hours as needed for cough. Patient not taking: Reported on 02/07/2016 01/06/16   Richarda Overlie, MD  lisinopril (PRINIVIL,ZESTRIL) 5 MG tablet Take 1 tablet (5 mg total) by mouth daily. Patient not taking: Reported on 02/07/2016 01/06/16   Richarda Overlie, MD  predniSONE (STERAPRED UNI-PAK 21 TAB) 10 MG (21) TBPK tablet Take 1 tablet (10 mg total) by mouth daily. As per  dose pack instructions Patient not taking:  Reported on 02/07/2016 01/06/16   Richarda Overlie, MD   BP 110/66 mmHg  Pulse 51  Temp(Src) 98 F (36.7 C) (Oral)  Resp 22  Ht 5\' 9"  (1.753 m)  Wt 175 lb 3.2 oz (79.47 kg)  BMI 25.86 kg/m2  SpO2 99% Physical Exam  Constitutional: He is oriented to person, place, and time. He appears well-developed and well-nourished. No distress.  HENT:  Head: Normocephalic and atraumatic.  Eyes: Conjunctivae and EOM are normal.  Neck: Normal range of motion. JVD present.  Cardiovascular: Regular rhythm, normal heart sounds and intact distal pulses.  Tachycardia present.  Exam reveals no gallop and no friction rub.   No murmur heard. Pulmonary/Chest: Tachypnea noted. He is in respiratory distress. He has decreased breath sounds (breath sounds decreased throughout, tightness). He has no wheezes. He has no rales. He exhibits tenderness (right).  Abdominal: Soft. He exhibits no distension. There is no tenderness. There is no guarding.  Musculoskeletal: He exhibits no edema.  Neurological: He is alert and oriented to person, place, and time.  Skin: Skin is warm. He is diaphoretic.  Nursing note and vitals reviewed.   ED Course  Procedures (including critical care time) Labs Review Labs Reviewed  CBC WITH DIFFERENTIAL/PLATELET - Abnormal; Notable for the following:    MCV 76.2 (*)    MCH 24.2 (*)    All other components within normal limits  COMPREHENSIVE METABOLIC PANEL - Abnormal; Notable for the following:    Sodium 129 (*)    Chloride 98 (*)    Glucose, Bld 115 (*)    Creatinine, Ser 1.70 (*)    GFR calc non Af Amer 48 (*)    GFR calc Af Amer 56 (*)    All other components within normal limits  BRAIN NATRIURETIC PEPTIDE - Abnormal; Notable for the following:    B Natriuretic Peptide 1519.8 (*)    All other components within normal limits  TROPONIN I - Abnormal; Notable for the following:    Troponin I 0.10 (*)    All other components within normal limits  TROPONIN I - Abnormal; Notable for  the following:    Troponin I 0.10 (*)    All other components within normal limits  URINE RAPID DRUG SCREEN, HOSP PERFORMED - Abnormal; Notable for the following:    Cocaine POSITIVE (*)    All other components within normal limits  I-STAT TROPOININ, ED - Abnormal; Notable for the following:    Troponin i, poc 0.17 (*)    All other components within normal limits  I-STAT VENOUS BLOOD GAS, ED - Abnormal; Notable for the following:    pH, Ven 7.516 (*)    pCO2, Ven 33.0 (*)    pO2, Ven 49.0 (*)    Bicarbonate 26.7 (*)    Acid-Base Excess 4.0 (*)    All other components within normal limits  MAGNESIUM  FERRITIN  TROPONIN I  HIV ANTIBODY (ROUTINE TESTING)  HEPATITIS C ANTIBODY  BASIC METABOLIC  PANEL  I-STAT CG4 LACTIC ACID, ED  Rosezena Sensor, ED  I-STAT CG4 LACTIC ACID, ED  Rosezena Sensor, ED    Imaging Review Dg Chest 2 View  02/07/2016  CLINICAL DATA:  RIGHT-sided chest pain. Tobacco abuse. History of substance abuse. History of cardiomyopathy. EXAM: CHEST  2 VIEW COMPARISON:  01/04/2016. FINDINGS: Mild cardiac enlargement. No infiltrates or failure. Hyperinflation suggesting COPD. Healing RIGHT fourth rib fracture. IMPRESSION: No active disease.  Similar appearance to priors. Electronically Signed   By: Elsie Stain M.D.   On: 02/07/2016 09:50   I have personally reviewed and evaluated these images and lab results as part of my medical decision-making.   EKG Interpretation   Date/Time:  Sunday February 07 2016 08:51:42 EDT Ventricular Rate:  121 PR Interval:    QRS Duration: 87 QT Interval:  324 QTC Calculation: 460 R Axis:   110 Text Interpretation:  Sinus tachycardia Ventricular premature complex LAE,  consider biatrial enlargement Right axis deviation Consider left  ventricular hypertrophy Anterior Q waves, possibly due to LVH Nonspecific  T abnormalities, inferior leads No significant change since last tracing  Confirmed by [facility] MD, Chailyn Racette ([id number]) on 02/07/2016  10:23:16 AM      MDM   Final diagnoses:  Chronic systolic CHF (congestive heart failure) (HCC)  Cocaine abuse  Asthma, unspecified asthma severity, with acute exacerbation  Elevated troponin   40 year old male with a past medical history of asthma, cocaine, marijuana abuse, smoking, recent admission May 17 for acute congestive heart failure EF 20-25%, right chest pain with possible pulmonary embolus on CT PE however the VQ scan showed low risk (not on anticoagulation), presents with concern for dyspnea and cough.  Differential diagnosis for dyspnea includes ACS, PE, COPD exacerbation, CHF exacerbation, anemia, pneumonia.  Chest x-ray was done which showed no acute abnormalities. EKG was evaluated by me which showed sinus tachycardia.  Patient with likely multifactorial etiology of dyspnea with asthma exacerbation and CHF with possible cocaine use as contributor. Patient's clinical history concerning for CHF, with missed Lasix doses, orthopnea, JVD, and BNP elevated to 1500s. He is given 40 mg of IV Lasix. In addition, patient was initially tachypnea, with poor air movement on exam, and was given continuous albuterol and Atrovent as well as Solu-Medrol and magnesium with improvement in exam, improved air movement. Feel patient's CHF exacerbation likely also exacerbated his asthma. No sign of pneumonia. Given recent PE work up, will defer whether repeat study is indicated to inpatient team, overall feel reassured by VQ scan and rib fx may also contribute to righ sided pain.  Troponin is elevated to 0.17, likely secondary to strain. Patient was given a dose of aspirin.    Patient will be admitted to the internal medicine teaching service for further care.    Alvira Monday, MD 02/07/16 1840

## 2016-02-08 DIAGNOSIS — J4541 Moderate persistent asthma with (acute) exacerbation: Secondary | ICD-10-CM

## 2016-02-08 DIAGNOSIS — F1420 Cocaine dependence, uncomplicated: Secondary | ICD-10-CM

## 2016-02-08 DIAGNOSIS — I428 Other cardiomyopathies: Secondary | ICD-10-CM

## 2016-02-08 LAB — BASIC METABOLIC PANEL
Anion gap: 10 (ref 5–15)
BUN: 21 mg/dL — AB (ref 6–20)
CALCIUM: 9.4 mg/dL (ref 8.9–10.3)
CO2: 26 mmol/L (ref 22–32)
CREATININE: 1.45 mg/dL — AB (ref 0.61–1.24)
Chloride: 98 mmol/L — ABNORMAL LOW (ref 101–111)
GFR calc Af Amer: >60 mL/min (ref >60)
GFR, EST NON AFRICAN AMERICAN: 59 mL/min — AB (ref >60)
GLUCOSE: 139 mg/dL — AB (ref 65–99)
POTASSIUM: 4 mmol/L (ref 3.5–5.1)
SODIUM: 134 mmol/L — AB (ref 135–145)

## 2016-02-08 LAB — HEPATITIS C ANTIBODY

## 2016-02-08 LAB — HIV ANTIBODY (ROUTINE TESTING W REFLEX): HIV SCREEN 4TH GENERATION: NONREACTIVE

## 2016-02-08 MED ORDER — FUROSEMIDE 40 MG PO TABS: 40.0000 mg | ORAL_TABLET | Freq: Every day | ORAL | Status: DC

## 2016-02-08 MED ORDER — GUAIFENESIN-DM 100-10 MG/5ML PO SYRP: 5.0000 mL | ORAL_SOLUTION | ORAL | Status: DC | PRN

## 2016-02-08 MED ORDER — GUAIFENESIN-DM 100-10 MG/5ML PO SYRP
5.0000 mL | ORAL_SOLUTION | ORAL | Status: DC | PRN
  Administered 2016-02-08: 5 mL via ORAL
  Filled 2016-02-08: qty 5

## 2016-02-08 MED ORDER — ALBUTEROL SULFATE HFA 108 (90 BASE) MCG/ACT IN AERS: 1.0000 | INHALATION_SPRAY | Freq: Four times a day (QID) | RESPIRATORY_TRACT | Status: DC | PRN

## 2016-02-08 MED ORDER — DICLOFENAC SODIUM 1 % TD GEL: 2.0000 g | Freq: Four times a day (QID) | TRANSDERMAL | Status: DC

## 2016-02-08 MED ORDER — ASPIRIN 81 MG PO TBEC: 81.0000 mg | DELAYED_RELEASE_TABLET | Freq: Every day | ORAL | Status: DC

## 2016-02-08 MED ORDER — IPRATROPIUM-ALBUTEROL 0.5-2.5 (3) MG/3ML IN SOLN
3.0000 mL | Freq: Three times a day (TID) | RESPIRATORY_TRACT | Status: DC
  Administered 2016-02-08: 3 mL via RESPIRATORY_TRACT
  Filled 2016-02-08: qty 3

## 2016-02-08 MED ORDER — BECLOMETHASONE DIPROPIONATE 80 MCG/ACT IN AERS: 1.0000 | INHALATION_SPRAY | Freq: Two times a day (BID) | RESPIRATORY_TRACT | Status: DC

## 2016-02-08 MED ORDER — PREDNISONE 20 MG PO TABS: 40.0000 mg | ORAL_TABLET | Freq: Every day | ORAL | Status: DC

## 2016-02-08 MED ORDER — CARVEDILOL 6.25 MG PO TABS: 6.2500 mg | ORAL_TABLET | Freq: Two times a day (BID) | ORAL | Status: DC

## 2016-02-08 MED ORDER — FUROSEMIDE 40 MG PO TABS
40.0000 mg | ORAL_TABLET | Freq: Every day | ORAL | Status: DC
Start: 2016-02-08 — End: 2016-02-08
  Administered 2016-02-08: 40 mg via ORAL
  Filled 2016-02-08: qty 1

## 2016-02-08 MED ORDER — BENZONATATE 100 MG PO CAPS: 100.0000 mg | ORAL_CAPSULE | Freq: Three times a day (TID) | ORAL | Status: DC | PRN

## 2016-02-08 MED ORDER — ALBUTEROL SULFATE (2.5 MG/3ML) 0.083% IN NEBU: 2.5000 mg | INHALATION_SOLUTION | Freq: Four times a day (QID) | RESPIRATORY_TRACT | Status: DC | PRN

## 2016-02-08 NOTE — Progress Notes (Signed)
Attempted to ambulate patient x2 this shift. Pt currently asking to ambulate after eating lunch - stating having a lot of pain from coughing still. Tessalon pearl administered this am along with scheduled voltaren gel. Will continue to monitor.

## 2016-02-08 NOTE — Progress Notes (Signed)
Received referral to arrange appointment for pt with [facility].  Contacted appointments scheduler who stated they are currently not taking new patients.  Talked with staff RN who stated pt has hospital follow-up appointment with [facility].

## 2016-02-08 NOTE — Progress Notes (Signed)
Pt ambulated ~473ft without difficulty. Pt maintained O2 sats >96% on RA the entire walk. Pt with no complaints. Pt currently resting in bed comfortably. MD Earlene Plater updated and made aware.

## 2016-02-08 NOTE — Discharge Instructions (Signed)
Mr. Behr,  It was a pleasure taking care of you in the hospital.  We think your cough is likely due to exacerbation of your asthma and want to send you home with some prescriptions for this: 1.  Albuterol inhaler that you have been using as needed 2.  An inhaled steroid to use on a daily basis 3.  A short course of steroid pills to take for 4 more days 4.  You can use over the counter cough medicine (such as Robitussin DM) 5.  Tessalon pearls to help suppress your cough  For your heart failure: 1. Continue taking Lasix 40mg  daily 2. We have increased your Coreg to 6.25mg  twice daily 3. STOP taking Lisinopril 4. Please do your best to not use cocaine   For your rib pain: 1.  Please avoid NSAIDs for now to help protect your kidneys 2.  Use tylenol as needed and Voltaren gel applied directly to where you have the pain  Please follow up with cardiology (contact information above) and with our Internal Medicine Clinic so that we can try to help you better manage your asthma.  Take Care

## 2016-02-08 NOTE — Discharge Summary (Signed)
Name: Mark [name] MRN: [id number] DOB: 1975/12/03 40 y.o. PCP: No Pcp Per Patient  Date of Admission: 02/07/2016  8:24 AM Date of Discharge: 02/08/2016 Attending Physician: No att. providers found  Discharge Diagnosis: 1. Asthma exacerbation 2. NICM 3. Right rib fracture 4. Tobacco abuse 5. Substance abuse  Active Problems:   Tobacco abuse   Elevated troponin   Right rib fracture   NICM (nonischemic cardiomyopathy) (HCC)   CHF (congestive heart failure) (HCC)  Discharge Medications:   Medication List    STOP taking these medications        lisinopril 5 MG tablet  Commonly known as:  PRINIVIL,ZESTRIL     predniSONE 10 MG (21) Tbpk tablet  Commonly known as:  STERAPRED UNI-PAK 21 TAB  Replaced by:  predniSONE 20 MG tablet      TAKE these medications        albuterol 108 (90 Base) MCG/ACT inhaler  Commonly known as:  PROVENTIL HFA;VENTOLIN HFA  Inhale 1-2 puffs into the lungs every 6 (six) hours as needed for wheezing or shortness of breath.     albuterol (2.5 MG/3ML) 0.083% nebulizer solution  Commonly known as:  PROVENTIL  Inhale 3 mLs (2.5 mg total) into the lungs every 6 (six) hours as needed for wheezing or shortness of breath.     aspirin 81 MG EC tablet  Take 1 tablet (81 mg total) by mouth daily.     beclomethasone 80 MCG/ACT inhaler  Commonly known as:  QVAR  Inhale 1 puff into the lungs 2 (two) times daily.     benzonatate 100 MG capsule  Commonly known as:  TESSALON  Take 1 capsule (100 mg total) by mouth 3 (three) times daily as needed for cough.     carvedilol 6.25 MG tablet  Commonly known as:  COREG  Take 1 tablet (6.25 mg total) by mouth 2 (two) times daily with a meal.     diclofenac sodium 1 % Gel  Commonly known as:  VOLTAREN  Apply 2 g topically 4 (four) times daily.     furosemide 40 MG tablet  Commonly known as:  LASIX  Take 1 tablet (40 mg total) by mouth daily.     guaiFENesin-dextromethorphan 100-10 MG/5ML syrup  Commonly  known as:  ROBITUSSIN DM  Take 5 mLs by mouth every 4 (four) hours as needed for cough.     predniSONE 20 MG tablet  Commonly known as:  DELTASONE  Take 2 tablets (40 mg total) by mouth daily with breakfast.  Start taking on:  02/09/2016        Disposition and follow-up:   Mr.Mark [name] was discharged from [facility] in Stable condition.    1.  At the hospital follow up visit please address:  - is he taking his medications as prescribed and did he get access to his meds? - did he complete steroids? End date 02/12/2016 - is he abstaining from cocaine and marijuana? - has he called cardiology to follow up? - how is his breathing and cough?  2.  Labs / imaging needed at time of follow-up: BMET, UDS   3.  Pending labs/ test needing follow-up: none  Follow-up Appointments: Follow-up Information    Follow up with Hyacinth Meeker, MD On 02/16/2016.   Specialty:  Internal Medicine   Why:  appointment time at 3:45pm   Contact information:   [address] [place] [address] [phone]       Schedule  an appointment as soon as possible for a visit with Verne Carrow, MD.   Specialty:  Cardiology   Why:  Hospital follow up, please call to make appointment   Contact information:   [address] [place] [address] [phone]       Discharge Instructions:   Consultations:    Procedures Performed:  Dg Chest 2 View  02/07/2016  CLINICAL DATA:  RIGHT-sided chest pain. Tobacco abuse. History of substance abuse. History of cardiomyopathy. EXAM: CHEST  2 VIEW COMPARISON:  01/04/2016. FINDINGS: Mild cardiac enlargement. No infiltrates or failure. Hyperinflation suggesting COPD. Healing RIGHT fourth rib fracture. IMPRESSION: No active disease.  Similar appearance to priors. Electronically Signed   By: Elsie Stain M.D.   On: 02/07/2016 09:50    Admission HPI:  [name] is a 41 y.o. man with PMHx of asthma, substance abuse  (marijuana and cocaine), NICM who presents to the ED with worsening cough and shortness of breath.   Patient was recently hospitalized and discharged Jan 06, 2016 for acute CHF where he underwent RHC/LHC with normal coronary arteries, dilated congestive nonischemic cardiomyopathy and Echo Doppler assessment of EF at 20 - 25%. He was discharged to home with aggressive medical management and outpatient follow-up. Since discharge, reports taking his carvedilol, Lasix, and Aspirin as prescribed. However, he does report running out of his Lasix 2 days ago. He stopped taking Lisinopril 2 weeks ago after developing what appeared to be a angioedema-like reaction, but he did not seek medical attention at the time. During this previous admission, patient also presented with right-sided chest pain and tachycardia that prompted evaluation for pulmonary embolus. CTA PE study was not optimal. Subsequently, a VQ scan was done and found to be low probability for PE. He was also found to have right-sided rib fractures due to a fall that were likely contributing to his pain.   Patient presents this morning for dyspnea and cough. He states that last night his coughing and SOB worsened despite using his albuterol and fluticasone. He reports a persistent, dry cough for 2 months now with continued right-sided pain described as aching, worse with cough, and rated 8/10. He uses ibuprofen for his pain. He reports nothing made his SOB better except for supplemental oxygen. He denies any chest pain, nausea, recent lower extremity edema, pleuritic pain. He is unsure if he has had a change in his weight from his recent discharge. He reports stable 3 pillow orthopnea, SOB with minimal activity. Otherwise, 10-point review of systems was negative except for a one-time episode of non-bloody non-bilious emesis.   Hospital Course by problem list: Active Problems:   Tobacco abuse   Elevated troponin   Right rib  fracture   NICM (nonischemic cardiomyopathy) (HCC)   CHF (congestive heart failure) (HCC)   Dyspnea and Cough: most likely due to asthma exacerbation as well as likely COPD based on smoking history and radiographic findings.  No significant volume overload evident on exam and weight was actually decreased from last admission.  His CXR was with no evidence of pulmonary edema or pneumonia.  Decreased breath sounds present at admission improved after breathing treatments and systemic steroids.  Continued prednisone burst for 5 days total and given new prescriptions for albuterol inhalers.  Also, started patient on inhaled corticosteroid for maintenance given his uncontrolled asthma.  Would recommend outpatient PFT's to better understand his asthma and presumed COPD given his tobacco history.  He was given cough medicine and short  course of Tessalon pearls to help his cough.  Acute Kidney Injury: likely secondary to NSAID use for rib pain and poor perfusion from poor systolic function.  Creatine improved with diuresis.  Check BMET at hospital follow up.  Non-Ischemic Cardiomyopathy: severely decreased systolic function on Echo last month. Some difficulty with adherence to medications as well as ongoing cocaine use.  He also described a reaction to Lisinopril that seemed consistent with angioedema.  We held his lisinopril and reviewed the literature regarding switching to an ARB after developing angioedema with ACE inhibitor.  It appears there is about a 10% chance of recurrence with switching to an ARB. Additionally, African-American race and decreased EF make this more likely.  Therefore, decision was made not to initiate treatment with ARB.  We increased his Coreg dose to 6.25mg  BID and continued his aspirin.  WOuld consider adding long-acting nitrate and hydralazine in the future.  We also discussed the necessity of him avoiding cocaine completely in the future.  Needs to follow up with cardiology.  Rib  Fractures: CXR shows healing rib fractures from what patient describes as injuries from a fall last month.  Given Voltaren gel.  We avoided NSAIDs given kidney function and avoided opiates with his substance abuse.  Microcytosis: decreased MCV with normal hemoglobin and target cells seen.  Normal ferritin as well. Likely benign Thalassemia trait with no further work up needed.  Hyperglycemia with Pre-DM: he has been persistently hyperglycemic over his last 2 admissions, but it is mild.  Recommend outpatient follow up, A1c last admission was found to be 6.3.  Substance Abuse: ongoing cocaine use contributing to his already severe cardiomyopathy.  As above, he was counseled on the need to abstain from cocaine.  Health Maintenance: HIV and HCV negative.  Discharge Vitals:   BP 99/75 mmHg  Pulse 110  Temp(Src) 98 F (36.7 C) (Oral)  Resp 20  Ht 5\' 9"  (1.753 m)  Wt 175 lb (79.379 kg)  BMI 25.83 kg/m2  SpO2 99%  Discharge Labs:  Results for orders placed or performed during the hospital encounter of 02/07/16 (from the past 24 hour(s))  Troponin I (q 6hr x 3)     Status: Abnormal   Collection Time: 02/07/16 10:21 PM  Result Value Ref Range   Troponin I 0.09 (H) <0.031 ng/mL  Basic metabolic panel     Status: Abnormal   Collection Time: 02/08/16  3:48 AM  Result Value Ref Range   Sodium 134 (L) 135 - 145 mmol/L   Potassium 4.0 3.5 - 5.1 mmol/L   Chloride 98 (L) 101 - 111 mmol/L   CO2 26 22 - 32 mmol/L   Glucose, Bld 139 (H) 65 - 99 mg/dL   BUN 21 (H) 6 - 20 mg/dL   Creatinine, Ser 1.60 (H) 0.61 - 1.24 mg/dL   Calcium 9.4 8.9 - 73.7 mg/dL   GFR calc non Af Amer 59 (L) >60 mL/min   GFR calc Af Amer >60 >60 mL/min   Anion gap 10 5 - 15    Signed: Gwynn Burly, DO 02/08/2016, 3:17 PM    Services Ordered on Discharge: none Equipment Ordered on Discharge: none

## 2016-02-08 NOTE — Progress Notes (Signed)
Subjective: Patient seen and examined this morning on rounds.  He had no acute events noted overnight, but does have some continued pain from his rib fractures.  Reports this morning feeling better.  He understands the need to abstain from cocaine in the future due to his cardiac risk.  Objective: Vital signs in last 24 hours: Filed Vitals:   02/08/16 0454 02/08/16 0843 02/08/16 0901 02/08/16 1242  BP: 102/70 109/78  99/75  Pulse: 51 109  110  Temp: 98 F (36.7 C)   98 F (36.7 C)  TempSrc: Oral   Oral  Resp: 19 20  20   Height:      Weight: 175 lb (79.379 kg)     SpO2: 96% 95% 93% 99%   Weight change:   Intake/Output Summary (Last 24 hours) at 02/08/16 1318 Last data filed at 02/08/16 1232  Gross per 24 hour  Intake    360 ml  Output   1000 ml  Net   -640 ml   General: sitting up in bed, on room air, no distress HEENT:  EOMI, no scleral icterus Cardiac: RRR, no rubs, murmurs or gallops Pulm: clear to auscultation bilaterally, normal effort, no wheezes or crackles Ext: warm and well perfused, no pedal edema Neuro: alert and oriented X3, cranial nerves II-XII grossly intact Skin: multiple tattoos  Lab Results: Basic Metabolic Panel:  Recent Labs Lab 02/07/16 0909 02/08/16 0348  NA 129* 134*  K 4.3 4.0  CL 98* 98*  CO2 22 26  GLUCOSE 115* 139*  BUN 14 21*  CREATININE 1.70* 1.45*  CALCIUM 9.1 9.4  MG 1.7  --    Liver Function Tests:  Recent Labs Lab 02/07/16 0909  AST 31  ALT 17  ALKPHOS 89  BILITOT 1.1  PROT 7.1  ALBUMIN 3.6   CBC:  Recent Labs Lab 02/07/16 0909  WBC 6.5  NEUTROABS 2.6  HGB 13.2  HCT 41.6  MCV 76.2*  PLT 184   Cardiac Enzymes:  Recent Labs Lab 02/07/16 1114 02/07/16 1448 02/07/16 2221  TROPONINI 0.10* 0.10* 0.09*   Anemia Panel:  Recent Labs Lab 02/07/16 1114  FERRITIN 124   Urine Drug Screen: Drugs of Abuse     Component Value Date/Time   LABOPIA NONE DETECTED 02/07/2016 1241   COCAINSCRNUR POSITIVE*  02/07/2016 1241   LABBENZ NONE DETECTED 02/07/2016 1241   AMPHETMU NONE DETECTED 02/07/2016 1241   THCU NONE DETECTED 02/07/2016 1241   LABBARB NONE DETECTED 02/07/2016 1241    Studies/Results: Dg Chest 2 View  02/07/2016  CLINICAL DATA:  RIGHT-sided chest pain. Tobacco abuse. History of substance abuse. History of cardiomyopathy. EXAM: CHEST  2 VIEW COMPARISON:  01/04/2016. FINDINGS: Mild cardiac enlargement. No infiltrates or failure. Hyperinflation suggesting COPD. Healing RIGHT fourth rib fracture. IMPRESSION: No active disease.  Similar appearance to priors. Electronically Signed   By: Elsie Stain M.D.   On: 02/07/2016 09:50   Medications: I have reviewed the patient's current medications. Scheduled Meds: . aspirin EC  81 mg Oral Daily  . carvedilol  3.125 mg Oral BID WC  . diclofenac sodium  2 g Topical QID  . enoxaparin (LOVENOX) injection  40 mg Subcutaneous Q24H  . furosemide  40 mg Oral Daily  . ipratropium-albuterol  3 mL Nebulization TID  . predniSONE  40 mg Oral Q breakfast   Continuous Infusions:  PRN Meds:.albuterol, benzonatate Assessment/Plan: Active Problems:   Tobacco abuse   Elevated troponin   Right rib fracture   NICM (nonischemic  cardiomyopathy) (HCC)   CHF (congestive heart failure) (HCC)  Dyspnea and Cough Likely Secondary to Asthma Exacerbation: Patient improved overnight on breathing treatments and with initiation of systemic steroids.  His CXR looks clear and his breath sounds have improved overnight with overall better air movement.   - continue albuterol as needed  - prednisone  daily x 5 days - will start inhaled corticosteroid at discharge.  QVAR is on the Medicaid preferred list. - recommend outpatient pulmonary function testing - smoking cessation - continue OTC cough suppressants  Acute Kidney Injury: likely secondary to NSAID use for rib pain and poor perfusion from poor systolic function - creatine improved this morning with  diuresis - avoid nephrotoxins - given Lasix  IV yesterday. Resume home dose  Non-Ischemic Cardiomyopathy: severely decreased systolic function on Echo last month. He appears euvolemic on exam again today and has diuresed well overnight.  Unfortunately, he has had some difficulty with medication adherence and continues to use cocaine with his UDS being positive this admission.  We are holding his lisinopril after he had a reaction that sounds consistent with angioedema based on his description that resolved after he stopped the medication.  After some review of literature, there is about a 10% chance of recurrence with switching to an ARB.  Additionally, African-American race and decreased EF make this more likely. - resume Lasix home dose - increase Carvedilol to 6.25mg  BID as safest option for BB given his ongoing cocaine use - HOLD Lisinopril given his angioedema-like reaction and will not start an ARB as discussed above - continue Aspirin  daily - consider adding long-acting nitrate and hydralazine in the future - counseled on importance of cocaine cessation  Rib Fractures: CXR shows healing rib fractures from what patient describes as injuries from a fall last month - will recommend Tylenol and OTC topical at home - avoid NSAIDs given kidney function - avoid opiates with his substance abuse  Substance Abuse: patient reports to [place] that he last used cocaine over a month ago but reported to ED provider using this week - check UDS - CSW and CM consult  Health Maintenance - HIV pending - Hep C pending  DVT PPx: Lovenox SQ  Diet: Heart healthy  Code: FULL  Dispo: Home today.  The patient does not have a current PCP (No Pcp Per Patient) and does need an [facility] hospital follow-up appointment after discharge.  The patient does not have transportation limitations that hinder transportation to clinic appointments.  .Services Needed at time of discharge: Y = Yes, Blank = No PT:    OT:   RN:   Equipment:   Other:       Gwynn Burly, DO 02/08/2016, 1:18 PM

## 2016-02-09 ENCOUNTER — Other Ambulatory Visit: Payer: Self-pay

## 2016-02-09 ENCOUNTER — Encounter: Payer: Self-pay | Admitting: Pharmacist

## 2016-02-09 ENCOUNTER — Ambulatory Visit (INDEPENDENT_AMBULATORY_CARE_PROVIDER_SITE_OTHER): Payer: Medicaid Other | Admitting: Pharmacist

## 2016-02-09 DIAGNOSIS — Z79899 Other long term (current) drug therapy: Secondary | ICD-10-CM

## 2016-02-09 DIAGNOSIS — Z7189 Other specified counseling: Secondary | ICD-10-CM

## 2016-02-09 DIAGNOSIS — J4520 Mild intermittent asthma, uncomplicated: Secondary | ICD-10-CM

## 2016-02-09 DIAGNOSIS — Z09 Encounter for follow-up examination after completed treatment for conditions other than malignant neoplasm: Secondary | ICD-10-CM

## 2016-02-09 DIAGNOSIS — I5022 Chronic systolic (congestive) heart failure: Secondary | ICD-10-CM

## 2016-02-09 DIAGNOSIS — Z596 Low income: Secondary | ICD-10-CM

## 2016-02-09 MED ORDER — GUAIFENESIN-DM 100-10 MG/5ML PO SYRP
5.0000 mL | ORAL_SOLUTION | ORAL | Status: DC | PRN
Start: 2016-02-09 — End: 2017-09-09

## 2016-02-09 MED ORDER — PREDNISONE 20 MG PO TABS: 40.0000 mg | ORAL_TABLET | Freq: Every day | ORAL | Status: DC

## 2016-02-09 MED ORDER — BENZONATATE 100 MG PO CAPS: 100.0000 mg | ORAL_CAPSULE | Freq: Three times a day (TID) | ORAL | Status: DC | PRN

## 2016-02-09 MED ORDER — FUROSEMIDE 40 MG PO TABS: 40.0000 mg | ORAL_TABLET | Freq: Every day | ORAL | Status: DC

## 2016-02-09 MED ORDER — CARVEDILOL 6.25 MG PO TABS: 6.2500 mg | ORAL_TABLET | Freq: Two times a day (BID) | ORAL | Status: DC

## 2016-02-09 MED FILL — FUROSEMIDE 40 MG TABLET: 40 | 30 days supply | Qty: 30 | Fill #0

## 2016-02-09 MED FILL — BENZONATATE 100 MG CAPSULE: 100 | 7 days supply | Qty: 20 | Fill #0

## 2016-02-09 MED FILL — predniSONE 20 MG TABS: 20 | 4 days supply | Qty: 8 | Fill #0

## 2016-02-09 MED FILL — CARVEDILOL 6.25 MG TABLET: 6.25 | 30 days supply | Qty: 60 | Fill #0

## 2016-02-09 NOTE — Progress Notes (Signed)
Patient reports with labored breathing, states he has not filled any medications since discharge due to being unable to afford, Medicaid becomes active next month. Per Dr. Josem Kaufmann approval, providing patient with medication supply today in collaboration with [facility] outpatient pharmacy. Patient is accompanied by his wife, Elease Hashimoto, who assists with medications at home.  Medication Samples have been provided to the patient. First doses taken during office visit.  Drug: albuterol (Proventil) Strength: 6.7 grams/inhaler Qty: 1 LOT: [id number] Exp.Date: Jan 2019 Dosing instructions: 1-2 puffs Q 6 hours PRN wheezing or SOB  Drug: mometasone (Asmanex) Strength: 100 mcg Qty: 2 LOT: [id number] Exp.Date: 07/19/16 Dosing instructions: 2 puffs daily  Other medications provided from pharmacy: prednisone, carvedilol, furosemide, benzonatate, guaifenesin-dextromethorphan, aspirin. The patient has been instructed regarding the correct time, dose, and frequency of taking this medication, including desired effects and most common side effects. Patient correctly demonstrated inhaler technique.  Patient's breathing improved during the visit. Patient was advised to seek medical attention if condition persists/worsens. Patient and significant other verbalized understanding by repeating back information discussed.  Kim,Jennifer J 10:59 AM 02/09/2016

## 2016-02-09 NOTE — Patient Instructions (Signed)
Patient educated about medication as defined in this encounter and verbalized understanding by repeating back instructions provided.   

## 2016-02-09 NOTE — Telephone Encounter (Signed)
Pt needs to speak with a nurse regarding albuterol inhaler and Voltaren Gel. Please call pt back.

## 2016-02-09 NOTE — Telephone Encounter (Signed)
Sent to dr Selena Batten, she has completed

## 2016-02-09 NOTE — Telephone Encounter (Signed)
Pt called, he has voltaren but did not get inhalers, spoke w/ tanya, dr Selena Batten, will work on getting inhalers

## 2016-02-16 ENCOUNTER — Encounter: Payer: Self-pay | Admitting: General Practice

## 2016-02-16 ENCOUNTER — Ambulatory Visit: Payer: Self-pay | Admitting: Internal Medicine

## 2016-07-28 ENCOUNTER — Telehealth: Payer: Self-pay | Admitting: Internal Medicine

## 2016-07-29 NOTE — Telephone Encounter (Signed)
[place], please see if you can make this pt an appt ASAP per dr Midwife

## 2016-07-29 NOTE — Telephone Encounter (Signed)
We admitted over summer. No showed HFU appt. Had gotten 2 month supply of med over summer, none since. Needs appt ASAP

## 2016-08-02 ENCOUNTER — Encounter: Payer: Self-pay | Admitting: General Practice

## 2016-08-02 ENCOUNTER — Other Ambulatory Visit: Payer: Self-pay | Admitting: Internal Medicine

## 2016-08-02 NOTE — Telephone Encounter (Signed)
Attempted to contact pt this am.  Tried both numbers on file [phone] [facility]mtcb and [phone] no answer and no voicemail line just keeps ringing.  Sending pt letter in the mail.

## 2016-08-04 ENCOUNTER — Other Ambulatory Visit: Payer: Self-pay | Admitting: Internal Medicine

## 2017-09-09 ENCOUNTER — Encounter (HOSPITAL_COMMUNITY): Payer: Self-pay | Admitting: Emergency Medicine

## 2017-09-09 ENCOUNTER — Emergency Department (HOSPITAL_COMMUNITY): Admission: EM | Admit: 2017-09-09 | Discharge: 2017-09-09 | Discharge status: Against Medical Advice | Payer: Self-pay

## 2017-09-09 ENCOUNTER — Emergency Department (HOSPITAL_COMMUNITY): Payer: Self-pay

## 2017-09-09 ENCOUNTER — Emergency Department (HOSPITAL_COMMUNITY)
Admission: EM | Admit: 2017-09-09 | Discharge: 2017-09-09 | Disposition: A | Discharge status: Home / Self Care | Payer: Self-pay | Attending: Emergency Medicine | Admitting: Emergency Medicine

## 2017-09-09 DIAGNOSIS — R1013 Epigastric pain: Secondary | ICD-10-CM | POA: Insufficient documentation

## 2017-09-09 DIAGNOSIS — Z79899 Other long term (current) drug therapy: Secondary | ICD-10-CM | POA: Insufficient documentation

## 2017-09-09 DIAGNOSIS — J45909 Unspecified asthma, uncomplicated: Secondary | ICD-10-CM | POA: Insufficient documentation

## 2017-09-09 DIAGNOSIS — F17210 Nicotine dependence, cigarettes, uncomplicated: Secondary | ICD-10-CM | POA: Insufficient documentation

## 2017-09-09 DIAGNOSIS — R059 Cough, unspecified: Secondary | ICD-10-CM

## 2017-09-09 DIAGNOSIS — N182 Chronic kidney disease, stage 2 (mild): Secondary | ICD-10-CM | POA: Insufficient documentation

## 2017-09-09 DIAGNOSIS — I5022 Chronic systolic (congestive) heart failure: Secondary | ICD-10-CM | POA: Insufficient documentation

## 2017-09-09 DIAGNOSIS — R05 Cough: Secondary | ICD-10-CM | POA: Insufficient documentation

## 2017-09-09 LAB — CBC WITH DIFFERENTIAL/PLATELET
BASOS PCT: 0 %
Basophils Absolute: 0 10*3/uL (ref 0.0–0.1)
EOS PCT: 1 %
Eosinophils Absolute: 0 10*3/uL (ref 0.0–0.7)
HCT: 39.5 % (ref 39.0–52.0)
Hemoglobin: 12.7 g/dL — ABNORMAL LOW (ref 13.0–17.0)
Lymphocytes Relative: 47 %
Lymphs Abs: 2.8 10*3/uL (ref 0.7–4.0)
MCH: 25.7 pg — ABNORMAL LOW (ref 26.0–34.0)
MCHC: 32.2 g/dL (ref 30.0–36.0)
MCV: 79.8 fL (ref 78.0–100.0)
MONO ABS: 0.5 10*3/uL (ref 0.1–1.0)
MONOS PCT: 8 %
Neutro Abs: 2.5 10*3/uL (ref 1.7–7.7)
Neutrophils Relative %: 44 %
PLATELETS: 193 10*3/uL (ref 150–400)
RBC: 4.95 MIL/uL (ref 4.22–5.81)
RDW: 14.9 % (ref 11.5–15.5)
WBC: 5.8 10*3/uL (ref 4.0–10.5)

## 2017-09-09 LAB — COMPREHENSIVE METABOLIC PANEL
ALBUMIN: 3.4 g/dL — AB (ref 3.5–5.0)
ALK PHOS: 102 U/L (ref 38–126)
ALT: 71 U/L — AB (ref 17–63)
AST: 58 U/L — AB (ref 15–41)
Anion gap: 8 (ref 5–15)
BILIRUBIN TOTAL: 1.1 mg/dL (ref 0.3–1.2)
BUN: 17 mg/dL (ref 6–20)
CALCIUM: 8.8 mg/dL — AB (ref 8.9–10.3)
CO2: 22 mmol/L (ref 22–32)
Chloride: 106 mmol/L (ref 101–111)
Creatinine, Ser: 1.34 mg/dL — ABNORMAL HIGH (ref 0.61–1.24)
GFR calc Af Amer: >60 mL/min (ref >60)
GFR calc non Af Amer: >60 mL/min (ref >60)
GLUCOSE: 96 mg/dL (ref 65–99)
Potassium: 4.7 mmol/L (ref 3.5–5.1)
Sodium: 136 mmol/L (ref 135–145)
TOTAL PROTEIN: 6.2 g/dL — AB (ref 6.5–8.1)

## 2017-09-09 LAB — I-STAT TROPONIN, ED: Troponin i, poc: 0.02 ng/mL (ref 0.00–0.08)

## 2017-09-09 LAB — D-DIMER, QUANTITATIVE: D-Dimer, Quant: 0.86 ug/mL-FEU — ABNORMAL HIGH (ref 0.00–0.50)

## 2017-09-09 LAB — LIPASE, BLOOD: Lipase: 32 U/L (ref 11–51)

## 2017-09-09 MED ORDER — PREDNISONE 10 MG PO TABS: 10.0000 mg | ORAL_TABLET | Freq: Every day | ORAL | 0 refills | Status: DC

## 2017-09-09 MED ORDER — IPRATROPIUM-ALBUTEROL 0.5-2.5 (3) MG/3ML IN SOLN
3.0000 mL | Freq: Once | RESPIRATORY_TRACT | Status: AC
  Administered 2017-09-09: 3 mL via RESPIRATORY_TRACT
  Filled 2017-09-09: qty 3

## 2017-09-09 MED ORDER — PREDNISONE 20 MG PO TABS
40.0000 mg | ORAL_TABLET | Freq: Once | ORAL | Status: AC
  Administered 2017-09-09: 40 mg via ORAL
  Filled 2017-09-09: qty 2

## 2017-09-09 MED ORDER — ALBUTEROL SULFATE HFA 108 (90 BASE) MCG/ACT IN AERS
1.0000 | INHALATION_SPRAY | RESPIRATORY_TRACT | Status: DC | PRN
  Administered 2017-09-09: 2 via RESPIRATORY_TRACT
  Filled 2017-09-09: qty 6.7

## 2017-09-09 NOTE — ED Notes (Signed)
2x call back for triage no answer

## 2017-09-09 NOTE — ED Notes (Signed)
Secretary received a call and the Pt went to [facility].

## 2017-09-09 NOTE — ED Triage Notes (Signed)
Multiple complaints today. Pt states several days of RLQ abdominal pain, upper abdominal pain and chest pain. Also reports asthma exacerbation. States his stool was green. Pt has significant cardiac hx.

## 2017-09-09 NOTE — ED Provider Notes (Signed)
[name] EMERGENCY DEPARTMENT Provider Note   CSN: [id number] Arrival date & time: 09/09/17  1205     History   Chief Complaint Chief Complaint  Patient presents with  . Cough    HPI Mark [name] is a 42 y.o. male.  Patient presents with epigastric pain and cough/dyspnea with associated loose bowel movements for the past 2 weeks.  He has no primary care relationship.  His medical history includes chronic systolic CHF, cigarette smoker, history of nonischemic cardiomyopathy (EF 20-25% on 01/05/17).  Cardiac catheterization at that time revealed normal coronary arteries.  Severity of symptoms is mild to moderate.  Exertion makes symptoms worse.  He is allergic to Lasix.      Past Medical History:  Diagnosis Date  . Asthma   . Chronic systolic CHF (congestive heart failure) (HCC)   . Cigarette smoker   . History of echocardiogram    a. Echo 5/17 - EF 20-25%, severe diff HK, restrictive physiology, mild to mod MR, severe reduced RVSF, mod RVE, mild RAE, mod TR, PASP 48 mmHg  . Hx of cardiac cath    a. LHC 5/17 - normal coronary arteries. PA 45/25, mean 33, PCWP mean 18  . NICM (nonischemic cardiomyopathy) (HCC)   . Substance abuse (HCC)    cocaine, marijuana    Patient Active Problem List   Diagnosis Date Noted  . CHF (congestive heart failure) (HCC) 02/07/2016  . Chronic systolic CHF (congestive heart failure) (HCC) 01/11/2016  . NICM (nonischemic cardiomyopathy) (HCC) 01/11/2016  . CKD (chronic kidney disease) stage 2, GFR 60-89 ml/min 01/11/2016  . Cocaine abuse (HCC) 01/11/2016  . Chest pain, pleuritic 01/03/2016  . Tobacco abuse 01/03/2016  . Asthma 01/03/2016  . Leg swelling 01/03/2016  . Tachycardia 01/03/2016  . Normocytic anemia 01/03/2016  . Elevated troponin 01/03/2016  . Right rib fracture 01/03/2016  . Chest pain     Past Surgical History:  Procedure Laterality Date  . CARDIAC CATHETERIZATION N/A 01/05/2016   Procedure:  Right/Left Heart Cath and Coronary Angiography;  Surgeon: Lennette Bihari, MD;  Location: [facility] INVASIVE CV LAB;  Service: Cardiovascular;  Laterality: N/A;  . none         Home Medications    Prior to Admission medications   Medication Sig Start Date End Date Taking? Authorizing Provider  albuterol (PROVENTIL HFA;VENTOLIN HFA) 108 (90 Base) MCG/ACT inhaler Inhale 1-2 puffs into the lungs every 6 (six) hours as needed for wheezing or shortness of breath. Patient not taking: Reported on 09/09/2017 02/08/16   Gwynn Burly, DO  albuterol (PROVENTIL) (2.5 MG/3ML) 0.083% nebulizer solution Inhale 3 mLs (2.5 mg total) into the lungs every 6 (six) hours as needed for wheezing or shortness of breath. Patient not taking: Reported on 09/09/2017 02/08/16   Gwynn Burly, DO  aspirin 81 MG EC tablet Take 1 tablet (81 mg total) by mouth daily. Patient not taking: Reported on 09/09/2017 02/08/16   Gwynn Burly, DO  beclomethasone (QVAR) 80 MCG/ACT inhaler Inhale 1 puff into the lungs 2 (two) times daily. Patient not taking: Reported on 09/09/2017 02/08/16   Gwynn Burly, DO  benzonatate (TESSALON) 100 MG capsule Take 1 capsule (100 mg total) by mouth 3 (three) times daily as needed for cough. 340B Chris Patient not taking: Reported on 09/09/2017 02/09/16   Inez Catalina, MD  carvedilol (COREG) 6.25 MG tablet Take 1 tablet (6.25 mg total) by mouth 2 (two) times daily with a meal. Patient not taking: Reported on  09/09/2017 02/08/16   Gwynn Burly, DO  diclofenac sodium (VOLTAREN) 1 % GEL Apply 2 g topically 4 (four) times daily. Patient not taking: Reported on 09/09/2017 02/08/16   Gwynn Burly, DO  furosemide (LASIX) 40 MG tablet Take 1 tablet (40 mg total) by mouth daily. Patient not taking: Reported on 09/09/2017 02/08/16   Gwynn Burly, DO  predniSONE (DELTASONE) 10 MG tablet Take 1 tablet (10 mg total) by mouth daily with breakfast. 3 tablets for 3 days, 2 tablets for 3 days, 1 tablet for 3 days.  09/09/17   Donnetta Hutching, MD    Family History Family History  Problem Relation Age of Onset  . Cardiomyopathy Father        Reports his father has an LVAD  . Heart failure Father   . Hypertension Father   . Deep vein thrombosis Neg Hx     Social History Social History   Tobacco Use  . Smoking status: Current Every Day Smoker    Packs/day: 0.50    Years: 20.00    Pack years: 10.00    Types: Cigarettes  . Smokeless tobacco: Never Used  Substance Use Topics  . Alcohol use: No  . Drug use: Yes    Types: Cocaine, Marijuana     Allergies   Hydrocodone; Lisinopril; and Penicillins   Review of Systems Review of Systems  All other systems reviewed and are negative.    Physical Exam Updated Vital Signs BP 109/80   Pulse (!) 102   Temp 98 F (36.7 C)   Resp (!) 25   SpO2 93%   Physical Exam  Constitutional: He is oriented to person, place, and time.  Nad, no dyspnea at rest.  HENT:  Head: Normocephalic and atraumatic.  Eyes: Conjunctivae are normal.  Neck: Neck supple.  Cardiovascular: Normal rate and regular rhythm.  Pulmonary/Chest: Effort normal.  Questionable bilateral expiratory wheeze.  Abdominal: Soft. Bowel sounds are normal.  Musculoskeletal: Normal range of motion.  Neurological: He is alert and oriented to person, place, and time.  Skin: Skin is warm and dry.  Psychiatric: He has a normal mood and affect. His behavior is normal.  Nursing note and vitals reviewed.    ED Treatments / Results  Labs (all labs ordered are listed, but only abnormal results are displayed) Labs Reviewed  COMPREHENSIVE METABOLIC PANEL - Abnormal; Notable for the following components:      Result Value   Creatinine, Ser 1.34 (*)    Calcium 8.8 (*)    Total Protein 6.2 (*)    Albumin 3.4 (*)    AST 58 (*)    ALT 71 (*)    All other components within normal limits  CBC WITH DIFFERENTIAL/PLATELET - Abnormal; Notable for the following components:   Hemoglobin 12.7 (*)     MCH 25.7 (*)    All other components within normal limits  D-DIMER, QUANTITATIVE (NOT AT [facility]) - Abnormal; Notable for the following components:   D-Dimer, Quant 0.86 (*)    All other components within normal limits  LIPASE, BLOOD  I-STAT TROPONIN, ED    EKG  EKG Interpretation  Date/Time:  Saturday September 09 2017 13:20:12 EST Ventricular Rate:  94 PR Interval:  152 QRS Duration: 78 QT Interval:  372 QTC Calculation: 465 R Axis:   94 Text Interpretation:  Sinus rhythm with sinus arrhythmia with occasional Premature ventricular complexes Lateral infarct , age undetermined Abnormal ECG Confirmed by Donnetta Hutching ([id number]) on 09/09/2017 5:38:10 PM  Radiology Dg Chest 2 View  Result Date: 09/09/2017 CLINICAL DATA:  Shortness of breath with productive cough for months. History of asthma. EXAM: CHEST  2 VIEW COMPARISON:  Radiographs 02/07/2016 and 01/04/2016. FINDINGS: Stable mild cardiac enlargement. The mediastinal contours are stable. There is chronic central airway and fissural thickening, but no airspace disease, pleural effusion or pneumothorax. The bones appear unchanged. IMPRESSION: No apparent acute findings. Stable chronic central airway and fissural thickening consistent with reactive airways disease or bronchitis. Mild cardiomegaly. Electronically Signed   By: Carey Bullocks M.D.   On: 09/09/2017 14:07    Procedures Procedures (including critical care time)  Medications Ordered in ED Medications  albuterol (PROVENTIL HFA;VENTOLIN HFA) 108 (90 Base) MCG/ACT inhaler 1-2 puff (2 puffs Inhalation Given 09/09/17 2259)  ipratropium-albuterol (DUONEB) 0.5-2.5 (3) MG/3ML nebulizer solution 3 mL (3 mLs Nebulization Given 09/09/17 2120)  predniSONE (DELTASONE) tablet 40 mg (40 mg Oral Given 09/09/17 2120)     Initial Impression / Assessment and Plan / ED Course  I have reviewed the triage vital signs and the nursing notes.  Pertinent labs & imaging results that were  available during my care of the patient were reviewed by me and considered in my medical decision making (see chart for details).     Patient is hemodynamically stable.  He has no risk factors for a pulmonary embolism.  EKG, troponin, chest x-ray all negative.  He feels better after nebulizer treatment and prednisone.  Discharge medication albuterol inhaler and prednisone.  Encouraged to get primary care follow-up  Final Clinical Impressions(s) / ED Diagnoses   Final diagnoses:  Cough  Epigastric pain    ED Discharge Orders        Ordered    predniSONE (DELTASONE) 10 MG tablet  Daily with breakfast     09/09/17 2245       Donnetta Hutching, MD 09/09/17 2304

## 2017-09-09 NOTE — Discharge Instructions (Signed)
Tests showed no life-threatening condition.  You must stop drinking and smoking.  Strongly recommend primary care follow-up.  Prescription for inhaler and prednisone for your breathing.

## 2017-09-09 NOTE — ED Notes (Signed)
1x call back for triage, no answer

## 2017-09-09 NOTE — ED Notes (Signed)
Main lab to add on d dimer 

## 2017-09-10 ENCOUNTER — Telehealth: Payer: Self-pay | Admitting: *Deleted

## 2017-09-10 NOTE — Telephone Encounter (Signed)
predniSONE (DELTASONE) 10 MG tablet  Sig: Take 1 tablet (10 mg total) by mouth daily with breakfast. 3 tablets for 3 days, 2 tablets for 3 days, 1 tablet for 3 days.    Start: 09/09/17    Quantity: 18 tablet Refills: 0         Spoke with Pharmacist and clarified that #18 tablets clearly indicated tapered dose. LM for Donnetta Hutching, MD , attending of record to amend MR. No further CM needs

## 2017-09-20 ENCOUNTER — Other Ambulatory Visit: Payer: Self-pay

## 2017-09-20 ENCOUNTER — Inpatient Hospital Stay (HOSPITAL_COMMUNITY)
Admission: EM | Admit: 2017-09-20 | Discharge: 2017-09-22 | DRG: 292 | Disposition: A | Discharge status: Home / Self Care | Payer: Self-pay | Attending: Internal Medicine | Admitting: Internal Medicine

## 2017-09-20 ENCOUNTER — Encounter (HOSPITAL_COMMUNITY): Payer: Self-pay

## 2017-09-20 ENCOUNTER — Other Ambulatory Visit (HOSPITAL_COMMUNITY): Payer: Self-pay

## 2017-09-20 ENCOUNTER — Emergency Department (HOSPITAL_COMMUNITY): Payer: Self-pay

## 2017-09-20 DIAGNOSIS — I509 Heart failure, unspecified: Secondary | ICD-10-CM

## 2017-09-20 DIAGNOSIS — R079 Chest pain, unspecified: Secondary | ICD-10-CM | POA: Diagnosis present

## 2017-09-20 DIAGNOSIS — I5022 Chronic systolic (congestive) heart failure: Secondary | ICD-10-CM

## 2017-09-20 DIAGNOSIS — Z8249 Family history of ischemic heart disease and other diseases of the circulatory system: Secondary | ICD-10-CM

## 2017-09-20 DIAGNOSIS — Z7982 Long term (current) use of aspirin: Secondary | ICD-10-CM

## 2017-09-20 DIAGNOSIS — Z9119 Patient's noncompliance with other medical treatment and regimen: Secondary | ICD-10-CM

## 2017-09-20 DIAGNOSIS — R109 Unspecified abdominal pain: Secondary | ICD-10-CM

## 2017-09-20 DIAGNOSIS — I420 Dilated cardiomyopathy: Secondary | ICD-10-CM | POA: Diagnosis present

## 2017-09-20 DIAGNOSIS — I5043 Acute on chronic combined systolic (congestive) and diastolic (congestive) heart failure: Principal | ICD-10-CM | POA: Diagnosis present

## 2017-09-20 DIAGNOSIS — Z880 Allergy status to penicillin: Secondary | ICD-10-CM

## 2017-09-20 DIAGNOSIS — J449 Chronic obstructive pulmonary disease, unspecified: Secondary | ICD-10-CM | POA: Diagnosis present

## 2017-09-20 DIAGNOSIS — I428 Other cardiomyopathies: Secondary | ICD-10-CM | POA: Diagnosis present

## 2017-09-20 DIAGNOSIS — Z888 Allergy status to other drugs, medicaments and biological substances status: Secondary | ICD-10-CM

## 2017-09-20 DIAGNOSIS — F1410 Cocaine abuse, uncomplicated: Secondary | ICD-10-CM | POA: Diagnosis present

## 2017-09-20 DIAGNOSIS — I5042 Chronic combined systolic (congestive) and diastolic (congestive) heart failure: Secondary | ICD-10-CM | POA: Diagnosis present

## 2017-09-20 DIAGNOSIS — J45909 Unspecified asthma, uncomplicated: Secondary | ICD-10-CM | POA: Diagnosis present

## 2017-09-20 DIAGNOSIS — J4520 Mild intermittent asthma, uncomplicated: Secondary | ICD-10-CM

## 2017-09-20 DIAGNOSIS — Z885 Allergy status to narcotic agent status: Secondary | ICD-10-CM

## 2017-09-20 DIAGNOSIS — N182 Chronic kidney disease, stage 2 (mild): Secondary | ICD-10-CM | POA: Diagnosis present

## 2017-09-20 LAB — CBC
HCT: 40 % (ref 39.0–52.0)
HEMOGLOBIN: 12.9 g/dL — AB (ref 13.0–17.0)
MCH: 25.6 pg — AB (ref 26.0–34.0)
MCHC: 32.3 g/dL (ref 30.0–36.0)
MCV: 79.4 fL (ref 78.0–100.0)
PLATELETS: 198 10*3/uL (ref 150–400)
RBC: 5.04 MIL/uL (ref 4.22–5.81)
RDW: 15 % (ref 11.5–15.5)
WBC: 7.9 10*3/uL (ref 4.0–10.5)

## 2017-09-20 LAB — I-STAT TROPONIN, ED: TROPONIN I, POC: 0.04 ng/mL (ref 0.00–0.08)

## 2017-09-20 LAB — BASIC METABOLIC PANEL
ANION GAP: 7 (ref 5–15)
BUN: 16 mg/dL (ref 6–20)
CALCIUM: 8.6 mg/dL — AB (ref 8.9–10.3)
CO2: 25 mmol/L (ref 22–32)
CREATININE: 1.31 mg/dL — AB (ref 0.61–1.24)
Chloride: 106 mmol/L (ref 101–111)
GLUCOSE: 109 mg/dL — AB (ref 65–99)
Potassium: 4.6 mmol/L (ref 3.5–5.1)
Sodium: 138 mmol/L (ref 135–145)

## 2017-09-20 LAB — BRAIN NATRIURETIC PEPTIDE: B Natriuretic Peptide: 2303.5 pg/mL — ABNORMAL HIGH (ref 0.0–100.0)

## 2017-09-20 MED ORDER — ACETAMINOPHEN 325 MG PO TABS
650.0000 mg | ORAL_TABLET | ORAL | Status: DC | PRN
  Administered 2017-09-21 – 2017-09-22 (×2): 650 mg via ORAL
  Filled 2017-09-20 (×2): qty 2

## 2017-09-20 MED ORDER — ALBUTEROL SULFATE (2.5 MG/3ML) 0.083% IN NEBU
2.5000 mg | INHALATION_SOLUTION | RESPIRATORY_TRACT | Status: DC | PRN
  Administered 2017-09-21 – 2017-09-22 (×2): 2.5 mg via RESPIRATORY_TRACT
  Filled 2017-09-20 (×2): qty 3

## 2017-09-20 MED ORDER — FUROSEMIDE 10 MG/ML IJ SOLN
20.0000 mg | Freq: Once | INTRAMUSCULAR | Status: DC
Start: 2017-09-20 — End: 2017-09-20
  Filled 2017-09-20: qty 2

## 2017-09-20 MED ORDER — ZOLPIDEM TARTRATE 5 MG PO TABS: 5.0000 mg | ORAL_TABLET | Freq: Every evening | ORAL | Status: DC | PRN

## 2017-09-20 MED ORDER — FUROSEMIDE 10 MG/ML IJ SOLN
40.0000 mg | Freq: Two times a day (BID) | INTRAMUSCULAR | Status: DC
  Administered 2017-09-20 – 2017-09-21 (×2): 40 mg via INTRAVENOUS
  Filled 2017-09-20: qty 4

## 2017-09-20 MED ORDER — BENZONATATE 100 MG PO CAPS
100.0000 mg | ORAL_CAPSULE | Freq: Three times a day (TID) | ORAL | Status: DC | PRN
  Administered 2017-09-21 – 2017-09-22 (×4): 100 mg via ORAL
  Filled 2017-09-20 (×4): qty 1

## 2017-09-20 MED ORDER — SODIUM CHLORIDE 0.9% FLUSH
3.0000 mL | Freq: Two times a day (BID) | INTRAVENOUS | Status: DC
  Administered 2017-09-20 – 2017-09-22 (×4): 3 mL via INTRAVENOUS

## 2017-09-20 MED ORDER — ALBUTEROL SULFATE (2.5 MG/3ML) 0.083% IN NEBU
5.0000 mg | INHALATION_SOLUTION | Freq: Once | RESPIRATORY_TRACT | Status: AC
  Administered 2017-09-20: 5 mg via RESPIRATORY_TRACT
  Filled 2017-09-20: qty 6

## 2017-09-20 MED ORDER — BUDESONIDE 0.5 MG/2ML IN SUSP
0.5000 mg | Freq: Two times a day (BID) | RESPIRATORY_TRACT | Status: DC
  Administered 2017-09-21 – 2017-09-22 (×3): 0.5 mg via RESPIRATORY_TRACT
  Filled 2017-09-20 (×3): qty 2

## 2017-09-20 MED ORDER — ONDANSETRON HCL 4 MG/2ML IJ SOLN: 4.0000 mg | Freq: Four times a day (QID) | INTRAMUSCULAR | Status: DC | PRN

## 2017-09-20 MED ORDER — ALPRAZOLAM 0.25 MG PO TABS: 0.2500 mg | ORAL_TABLET | Freq: Two times a day (BID) | ORAL | Status: DC | PRN

## 2017-09-20 MED ORDER — SODIUM CHLORIDE 0.9% FLUSH: 3.0000 mL | INTRAVENOUS | Status: DC | PRN

## 2017-09-20 MED ORDER — FUROSEMIDE 10 MG/ML IJ SOLN
INTRAMUSCULAR | Status: AC
  Filled 2017-09-20: qty 2

## 2017-09-20 MED ORDER — ENOXAPARIN SODIUM 40 MG/0.4ML ~~LOC~~ SOLN
40.0000 mg | SUBCUTANEOUS | Status: DC
  Administered 2017-09-21: 40 mg via SUBCUTANEOUS
  Filled 2017-09-20: qty 0.4

## 2017-09-20 MED ORDER — SODIUM CHLORIDE 0.9 % IV SOLN: 250.0000 mL | INTRAVENOUS | Status: DC | PRN

## 2017-09-20 MED ORDER — HYDRALAZINE HCL 20 MG/ML IJ SOLN: 10.0000 mg | INTRAMUSCULAR | Status: DC | PRN

## 2017-09-20 NOTE — ED Triage Notes (Signed)
Pt reports he was given steroids for asthma several days ago and is now having swelling to BLE, throat, face. PT has audible wheezing in triage and reports chest tightness.

## 2017-09-20 NOTE — ED Notes (Signed)
Gave pt a sandwich, apple juice and a cup of ice.

## 2017-09-20 NOTE — ED Notes (Signed)
Pt reports SOB for the past 3 years but got worse for the last 4 days.

## 2017-09-20 NOTE — H&P (Signed)
History and Physical    Mark [name] JKK:[id number] DOB: Dec 09, 1975 DOA: 09/20/2017  PCP: Patient, No Pcp Per   Patient coming from: Home  Chief Complaint: SOB, swelling, chest pain  HPI: Mark [name] is a 42 y.o. male with medical history significant for cocaine abuse, asthma, and chronic systolic CHF, now presenting to the emergency department for evaluation of bilateral leg edema, shortness of breath, and chest pain.  Patient was admitted to the hospital in May 2017 with shortness of breath, found to have systolic CHF with EF 20-25% and normal coronaries on cath during that admission.  Patient reports that he was incarcerated shortly after leaving the hospital in 2017 and never filled his Lasix or Coreg.  He reports the insidious development of bilateral lower extremity edema and shortness of breath over the past couple months, now accompanied by chest pain for the past week.  He was seen in the emergency department for these, at approximately 10 days ago, suspected to have a asthma exacerbation with, and discharged home with prednisone.  D-dimer was elevated to 0.86 during the ED visit 10 days ago.  PE was not excluded.  ED Course: Upon arrival to the ED, patient is found to be afebrile, saturating adequately on room air, tachypneic to 30, tachycardic in the 110s, and with stable blood pressure.  EKG features a sinus tachycardia with rate 116.  Chest x-rays negative for acute cardiopulmonary disease.  Chemistry panel reveals a creatinine of 1.31, consistent with his apparent baseline.  CBC is unremarkable.  Troponin is within the normal limits and BNP is elevated to 2300.  Patient was treated with 2 albuterol nebs and IV Lasix in the ED.  He remains mildly tachycardic and dyspneic, and he will be admitted to the telemetry unit for ongoing evaluation and management of shortness of breath, swelling, and chest pain, suspected secondary to acute on chronic systolic CHF, but with elevated d-dimer and  concern for potential PE.  Review of Systems:  All other systems reviewed and apart from HPI, are negative.  Past Medical History:  Diagnosis Date  . Asthma   . Chronic systolic CHF (congestive heart failure) (HCC)   . Cigarette smoker   . History of echocardiogram    a. Echo 5/17 - EF 20-25%, severe diff HK, restrictive physiology, mild to mod MR, severe reduced RVSF, mod RVE, mild RAE, mod TR, PASP 48 mmHg  . Hx of cardiac cath    a. LHC 5/17 - normal coronary arteries. PA 45/25, mean 33, PCWP mean 18  . NICM (nonischemic cardiomyopathy) (HCC)   . Substance abuse (HCC)    cocaine, marijuana    Past Surgical History:  Procedure Laterality Date  . CARDIAC CATHETERIZATION N/A 01/05/2016   Procedure: Right/Left Heart Cath and Coronary Angiography;  Surgeon: Lennette Bihari, MD;  Location: [facility] INVASIVE CV LAB;  Service: Cardiovascular;  Laterality: N/A;  . none       reports that he has been smoking cigarettes.  He has a 10.00 pack-year smoking history. he has never used smokeless tobacco. He reports that he uses drugs. Drugs: Cocaine and Marijuana. He reports that he does not drink alcohol.  Allergies  Allergen Reactions  . Hydrocodone Hives  . Lisinopril Swelling and Other (See Comments)    Facial swelling/angioedema  . Prednisone Shortness Of Breath, Nausea Only, Swelling and Other (See Comments)    Also made chest feel tight and genital area, legs, and face became swollen badly  . Penicillins Hives  and Swelling     Has patient had a PCN reaction causing immediate rash, facial/tongue/throat swelling, SOB or lightheadedness with hypotension: Yes Has patient had a PCN reaction causing severe rash involving mucus membranes or skin necrosis: No Has patient had a PCN reaction that required hospitalization: No Has patient had a PCN reaction occurring within the last 10 years: No If all of the above answers are "NO", then may proceed with Cephalosporin use.     Family History    Problem Relation Age of Onset  . Cardiomyopathy Father        Reports his father has an LVAD  . Heart failure Father   . Hypertension Father   . Deep vein thrombosis Neg Hx      Prior to Admission medications   Medication Sig Start Date End Date Taking? Authorizing Provider  albuterol (PROAIR HFA) 108 (90 Base) MCG/ACT inhaler Inhale 2 puffs into the lungs every 4 (four) hours as needed for wheezing or shortness of breath.    Yes [provider]  aspirin 81 MG EC tablet Take 1 tablet (81 mg total) by mouth daily. 02/08/16  Yes Gwynn Burly, DO  albuterol (PROVENTIL HFA;VENTOLIN HFA) 108 (90 Base) MCG/ACT inhaler Inhale 1-2 puffs into the lungs every 6 (six) hours as needed for wheezing or shortness of breath. Patient not taking: Reported on 09/20/2017 02/08/16   Gwynn Burly, DO  albuterol (PROVENTIL) (2.5 MG/3ML) 0.083% nebulizer solution Inhale 3 mLs (2.5 mg total) into the lungs every 6 (six) hours as needed for wheezing or shortness of breath. Patient not taking: Reported on 09/20/2017 02/08/16   Gwynn Burly, DO  beclomethasone (QVAR) 80 MCG/ACT inhaler Inhale 1 puff into the lungs 2 (two) times daily. Patient not taking: Reported on 09/20/2017 02/08/16   Gwynn Burly, DO  benzonatate (TESSALON) 100 MG capsule Take 1 capsule (100 mg total) by mouth 3 (three) times daily as needed for cough. 340B Chris Patient not taking: Reported on 09/20/2017 02/09/16   Inez Catalina, MD  carvedilol (COREG) 6.25 MG tablet Take 1 tablet (6.25 mg total) by mouth 2 (two) times daily with a meal. Patient not taking: Reported on 09/20/2017 02/08/16   Gwynn Burly, DO  diclofenac sodium (VOLTAREN) 1 % GEL Apply 2 g topically 4 (four) times daily. Patient not taking: Reported on 09/20/2017 02/08/16   Gwynn Burly, DO  furosemide (LASIX) 40 MG tablet Take 1 tablet (40 mg total) by mouth daily. Patient not taking: Reported on 09/20/2017 02/08/16   Gwynn Burly, DO  predniSONE (DELTASONE) 10  MG tablet Take 1 tablet (10 mg total) by mouth daily with breakfast. 3 tablets for 3 days, 2 tablets for 3 days, 1 tablet for 3 days. Patient not taking: Reported on 09/20/2017 09/09/17   Donnetta Hutching, MD    Physical Exam: Vitals:   09/20/17 2020 09/20/17 2115 09/20/17 2215 09/20/17 2230  BP: 137/75 (!) 142/95 126/89 (!) 130/96  Pulse: 66 (!) 51 (!) 109 (!) 110  Resp: (!) 26 (!) 25 (!) 30 (!) 23  Temp:      TempSrc:      SpO2: 100% 98% 96% 95%      Constitutional: NAD, calm, comfortable Eyes: PERTLA, lids and conjunctivae normal ENMT: Mucous membranes are moist. Posterior pharynx clear of any exudate or lesions.   Neck: normal, supple, no masses, no thyromegaly Respiratory: Tachypneic, no wheezing, no crackles. Dyspneic with speech.   Cardiovascular: Rate ~110 and regular. Pretibial pitting edema bilaterally. Abdomen: No distension, no  tenderness, no masses palpated. Bowel sounds normal.  Musculoskeletal: no clubbing / cyanosis. No joint deformity upper and lower extremities.   Skin: no significant rashes, lesions, ulcers. Warm, dry, well-perfused. Neurologic: CN 2-12 grossly intact. Sensation intact. Strength 5/5 in all 4 limbs.  Psychiatric:  Alert and oriented x 3. Calm, cooperative.     Labs on Admission: I have personally reviewed following labs and imaging studies  CBC: Recent Labs  Lab 09/20/17 1841  WBC 7.9  HGB 12.9*  HCT 40.0  MCV 79.4  PLT 198   Basic Metabolic Panel: Recent Labs  Lab 09/20/17 1841  NA 138  K 4.6  CL 106  CO2 25  GLUCOSE 109*  BUN 16  CREATININE 1.31*  CALCIUM 8.6*   GFR: CrCl cannot be calculated (Unknown ideal weight.). Liver Function Tests: No results for input(s): AST, ALT, ALKPHOS, BILITOT, PROT, ALBUMIN in the last 168 hours. No results for input(s): LIPASE, AMYLASE in the last 168 hours. No results for input(s): AMMONIA in the last 168 hours. Coagulation Profile: No results for input(s): INR, PROTIME in the last 168  hours. Cardiac Enzymes: No results for input(s): CKTOTAL, CKMB, CKMBINDEX, TROPONINI in the last 168 hours. BNP (last 3 results) No results for input(s): PROBNP in the last 8760 hours. HbA1C: No results for input(s): HGBA1C in the last 72 hours. CBG: No results for input(s): GLUCAP in the last 168 hours. Lipid Profile: No results for input(s): CHOL, HDL, LDLCALC, TRIG, CHOLHDL, LDLDIRECT in the last 72 hours. Thyroid Function Tests: No results for input(s): TSH, T4TOTAL, FREET4, T3FREE, THYROIDAB in the last 72 hours. Anemia Panel: No results for input(s): VITAMINB12, FOLATE, FERRITIN, TIBC, IRON, RETICCTPCT in the last 72 hours. Urine analysis: No results found for: COLORURINE, APPEARANCEUR, LABSPEC, PHURINE, GLUCOSEU, HGBUR, BILIRUBINUR, KETONESUR, PROTEINUR, UROBILINOGEN, NITRITE, LEUKOCYTESUR Sepsis Labs: @LABRCNTIP (procalcitonin:4,lacticidven:4) )No results found for this or any previous visit (from the past 240 hour(s)).   Radiological Exams on Admission: Dg Chest 2 View  Result Date: 09/20/2017 CLINICAL DATA:  Facial and throat swelling, initial encounter EXAM: CHEST  2 VIEW COMPARISON:  09/09/2017 FINDINGS: Cardiac shadow is stable. The lungs are well aerated bilaterally. No focal infiltrate or sizable effusion is seen. No acute bony abnormality is noted. IMPRESSION: No active cardiopulmonary disease. Electronically Signed   By: Alcide Clever M.D.   On: 09/20/2017 19:12    EKG: Independently reviewed. Sinus tachycardia (rate 116), consider LAE.   Assessment/Plan  1. Acute on chronic systolic CHF  - Presents with SOB, peripheral edema, and chest pain - Found to be mildly tachycardic, marked elevation in BNP, no edema on CXR   - EF 20-25% on echo from May 2017 with normal coronaries on cath that admission  - Never filled his Lasix or Coreg when discharged from hospital in 2017  - Diuresis with Lasix 40 mg IV q12h, continue cardiac monitoring, SLIV, fluid-restrict diet, follow  strict I/O's and daily wts, follow daily chem panel, update echo, hold beta-blocker until better compensated and PE excluded as below  2. Asthma  - Treated with recent prednisone taper, given multiple albuterol nebs in ED and no wheezing appreciated at time of admission  - Continue Qvar and prn albuterol    3. Chest pain - Reports intermittent chest tightness for past few days  - Initial troponin wnl and CXR unremarkable, no acute ischemic features identified on EKG   - Normal coronaries on cath in May 2017  - Suspected secondary to #1 - Had elevated d-dimer in  ED ten days ago, will rule-out PE with CTA chest    4. CKD stage II - SCr is 1.31 on admission, consistent with his apparent baseline  - Follow daily chem panel during diuresis    5. Hx cocaine abuse  - Denies recent use, UDS pending     DVT prophylaxis: Lovenox Code Status: Full  Family Communication: Discussed with patient Disposition Plan: Admit to telemetry Consults called: None Admission status: Inpatient    Briscoe Deutscher, MD [facility] Pager [phone]  If 7PM-7AM, please contact night-coverage [email or web address] Password [email or web address]  09/20/2017, 11:22 PM

## 2017-09-20 NOTE — ED Provider Notes (Signed)
[id number]  [facility] 4E CV SURGICAL PROGRESSIVE CARE Provider Note   CSN: [id number] Arrival date & time: 09/20/17  1817     History   Chief Complaint Chief Complaint  Patient presents with  . Shortness of Breath    HPI Mark [name] is a 42 y.o. male.  The history is provided by the patient. No language interpreter was used.  Shortness of Breath    Mark [name] is a 42 y.o. male who presents to the Emergency Department complaining of sob.  He has a history of chronic shortness of breath and reports increased shortness of breath for the last several days with significant dyspnea on exertion.  He has episodic chest pain that is fleeting and only last for a few seconds at a time.  It comes and goes related to positions, twisting and activity.  No fevers but he does feel hot at times he had night sweats last night.  He has a cough productive of brownish red sputum.  He also reports swelling throughout his arms and legs for the last several days. Past Medical History:  Diagnosis Date  . Asthma   . Chronic systolic CHF (congestive heart failure) (HCC)   . Cigarette smoker   . History of echocardiogram    a. Echo 5/17 - EF 20-25%, severe diff HK, restrictive physiology, mild to mod MR, severe reduced RVSF, mod RVE, mild RAE, mod TR, PASP 48 mmHg  . Hx of cardiac cath    a. LHC 5/17 - normal coronary arteries. PA 45/25, mean 33, PCWP mean 18  . NICM (nonischemic cardiomyopathy) (HCC)   . Substance abuse (HCC)    cocaine, marijuana    Patient Active Problem List   Diagnosis Date Noted  . CHF (congestive heart failure) (HCC) 02/07/2016  . Acute on chronic systolic CHF (congestive heart failure) (HCC) 01/11/2016  . NICM (nonischemic cardiomyopathy) (HCC) 01/11/2016  . CKD (chronic kidney disease) stage 2, GFR 60-89 ml/min 01/11/2016  . Cocaine abuse (HCC) 01/11/2016  . Chest pain, pleuritic 01/03/2016  . Tobacco abuse 01/03/2016  . Asthma 01/03/2016  . Leg swelling 01/03/2016    . Tachycardia 01/03/2016  . Normocytic anemia 01/03/2016  . Elevated troponin 01/03/2016  . Right rib fracture 01/03/2016  . Chest pain     Past Surgical History:  Procedure Laterality Date  . CARDIAC CATHETERIZATION N/A 01/05/2016   Procedure: Right/Left Heart Cath and Coronary Angiography;  Surgeon: Lennette Bihari, MD;  Location: [facility] INVASIVE CV LAB;  Service: Cardiovascular;  Laterality: N/A;  . none         Home Medications    Prior to Admission medications   Medication Sig Start Date End Date Taking? Authorizing Provider  albuterol (PROAIR HFA) 108 (90 Base) MCG/ACT inhaler Inhale 2 puffs into the lungs every 4 (four) hours as needed for wheezing or shortness of breath.    Yes [provider]  aspirin 81 MG EC tablet Take 1 tablet (81 mg total) by mouth daily. 02/08/16  Yes Gwynn Burly, DO  albuterol (PROVENTIL HFA;VENTOLIN HFA) 108 (90 Base) MCG/ACT inhaler Inhale 1-2 puffs into the lungs every 6 (six) hours as needed for wheezing or shortness of breath. Patient not taking: Reported on 09/20/2017 02/08/16   Gwynn Burly, DO  albuterol (PROVENTIL) (2.5 MG/3ML) 0.083% nebulizer solution Inhale 3 mLs (2.5 mg total) into the lungs every 6 (six) hours as needed for wheezing or shortness of breath. Patient not taking: Reported on 09/20/2017 02/08/16   Gwynn Burly,  DO  beclomethasone (QVAR) 80 MCG/ACT inhaler Inhale 1 puff into the lungs 2 (two) times daily. Patient not taking: Reported on 09/20/2017 02/08/16   Gwynn Burly, DO  benzonatate (TESSALON) 100 MG capsule Take 1 capsule (100 mg total) by mouth 3 (three) times daily as needed for cough. 340B Chris Patient not taking: Reported on 09/20/2017 02/09/16   Inez Catalina, MD  carvedilol (COREG) 6.25 MG tablet Take 1 tablet (6.25 mg total) by mouth 2 (two) times daily with a meal. Patient not taking: Reported on 09/20/2017 02/08/16   Gwynn Burly, DO  diclofenac sodium (VOLTAREN) 1 % GEL Apply 2 g topically 4 (four)  times daily. Patient not taking: Reported on 09/20/2017 02/08/16   Gwynn Burly, DO  furosemide (LASIX) 40 MG tablet Take 1 tablet (40 mg total) by mouth daily. Patient not taking: Reported on 09/20/2017 02/08/16   Gwynn Burly, DO  predniSONE (DELTASONE) 10 MG tablet Take 1 tablet (10 mg total) by mouth daily with breakfast. 3 tablets for 3 days, 2 tablets for 3 days, 1 tablet for 3 days. Patient not taking: Reported on 09/20/2017 09/09/17   Donnetta Hutching, MD    Family History Family History  Problem Relation Age of Onset  . Cardiomyopathy Father        Reports his father has an LVAD  . Heart failure Father   . Hypertension Father   . Deep vein thrombosis Neg Hx     Social History Social History   Tobacco Use  . Smoking status: Current Every Day Smoker    Packs/day: 0.50    Years: 20.00    Pack years: 10.00    Types: Cigarettes  . Smokeless tobacco: Never Used  Substance Use Topics  . Alcohol use: No  . Drug use: Yes    Types: Cocaine, Marijuana     Allergies   Hydrocodone; Lisinopril; Prednisone; and Penicillins   Review of Systems Review of Systems  Respiratory: Positive for shortness of breath.   All other systems reviewed and are negative.    Physical Exam Updated Vital Signs BP (!) 122/99   Pulse 86   Temp 98.4 F (36.9 C) (Oral)   Resp 20   SpO2 97%   Physical Exam  Constitutional: He is oriented to person, place, and time. He appears well-developed and well-nourished.  HENT:  Head: Normocephalic and atraumatic.  Neck: JVD present.  Cardiovascular: Normal rate and regular rhythm.  No murmur heard. Pulmonary/Chest: Effort normal and breath sounds normal. No respiratory distress.  Abdominal: Soft. There is no tenderness. There is no rebound and no guarding.  Musculoskeletal: He exhibits no tenderness.  1-2+ edema to bilateral lower extremities  Neurological: He is alert and oriented to person, place, and time.  Skin: Skin is warm and dry.    Psychiatric: He has a normal mood and affect. His behavior is normal.  Nursing note and vitals reviewed.    ED Treatments / Results  Labs (all labs ordered are listed, but only abnormal results are displayed) Labs Reviewed  BASIC METABOLIC PANEL - Abnormal; Notable for the following components:      Result Value   Glucose, Bld 109 (*)    Creatinine, Ser 1.31 (*)    Calcium 8.6 (*)    All other components within normal limits  CBC - Abnormal; Notable for the following components:   Hemoglobin 12.9 (*)    MCH 25.6 (*)    All other components within normal limits  BRAIN NATRIURETIC PEPTIDE -  Abnormal; Notable for the following components:   B Natriuretic Peptide 2,303.5 (*)    All other components within normal limits  RAPID URINE DRUG SCREEN, HOSP PERFORMED  HIV ANTIBODY (ROUTINE TESTING)  BASIC METABOLIC PANEL  I-STAT TROPONIN, ED    EKG  EKG Interpretation  Date/Time:  Wednesday September 20 2017 21:17:19 EST Ventricular Rate:  115 PR Interval:  164 QRS Duration: 77 QT Interval:  321 QTC Calculation: 444 R Axis:   105 Text Interpretation:  Sinus tachycardia Low voltage with right axis deviation Consider anterolateral infarct Confirmed by Tilden Fossa [phone]) on 09/20/2017 9:33:51 PM       Radiology Dg Chest 2 View  Result Date: 09/20/2017 CLINICAL DATA:  Facial and throat swelling, initial encounter EXAM: CHEST  2 VIEW COMPARISON:  09/09/2017 FINDINGS: Cardiac shadow is stable. The lungs are well aerated bilaterally. No focal infiltrate or sizable effusion is seen. No acute bony abnormality is noted. IMPRESSION: No active cardiopulmonary disease. Electronically Signed   By: Alcide Clever M.D.   On: 09/20/2017 19:12   Ct Angio Chest Pe W Or Wo Contrast  Result Date: 09/21/2017 CLINICAL DATA:  Shortness of breath.  Positive D-dimer. EXAM: CT ANGIOGRAPHY CHEST WITH CONTRAST TECHNIQUE: Multidetector CT imaging of the chest was performed using the standard protocol  during bolus administration of intravenous contrast. Multiplanar CT image reconstructions and MIPs were obtained to evaluate the vascular anatomy. CONTRAST:  100 mL Isovue 370 COMPARISON:  01/03/2016 FINDINGS: Cardiovascular: Good opacification of the central and segmental pulmonary arteries. No focal filling defects demonstrated. No evidence of significant pulmonary embolus. Cardiac enlargement with left ventricular dilatation. No pericardial effusion. Normal caliber thoracic aorta. Mediastinum/Nodes: No enlarged mediastinal, hilar, or axillary lymph nodes. Thyroid gland, trachea, and esophagus demonstrate no significant findings. Lungs/Pleura: Emphysematous changes in the lungs. No airspace disease or consolidation. No pleural effusions. No pneumothorax. Airways are patent. Upper Abdomen: No acute abnormality. Musculoskeletal: No chest wall abnormality. No acute or significant osseous findings. Review of the MIP images confirms the above findings. IMPRESSION: 1. No evidence of significant pulmonary embolus. 2. Cardiac enlargement with left ventricular dilatation. 3. No evidence of active pulmonary disease. 4. Diffuse emphysematous changes in the lungs. Emphysema (ICD10-J43.9). Electronically Signed   By: Burman Nieves M.D.   On: 09/21/2017 01:11    Procedures Procedures (including critical care time)  Medications Ordered in ED Medications  furosemide (LASIX) injection 40 mg (40 mg Intravenous Given 09/20/17 2328)  budesonide (PULMICORT) nebulizer solution 0.5 mg (1 puff Inhalation Not Given 09/20/17 2344)  albuterol (PROVENTIL) (2.5 MG/3ML) 0.083% nebulizer solution 2.5 mg (not administered)  benzonatate (TESSALON) capsule 100 mg (not administered)  sodium chloride flush (NS) 0.9 % injection 3 mL (3 mLs Intravenous Given 09/20/17 2334)  sodium chloride flush (NS) 0.9 % injection 3 mL (not administered)  0.9 %  sodium chloride infusion (not administered)  acetaminophen (TYLENOL) tablet 650 mg (not  administered)  ondansetron (ZOFRAN) injection 4 mg (not administered)  enoxaparin (LOVENOX) injection 40 mg (not administered)  ALPRAZolam (XANAX) tablet 0.25 mg (not administered)  zolpidem (AMBIEN) tablet 5 mg (not administered)  hydrALAZINE (APRESOLINE) injection 10 mg (not administered)  iopamidol (ISOVUE-370) 76 % injection (not administered)  albuterol (PROVENTIL) (2.5 MG/3ML) 0.083% nebulizer solution 5 mg (5 mg Nebulization Given 09/20/17 1838)  albuterol (PROVENTIL) (2.5 MG/3ML) 0.083% nebulizer solution 5 mg (5 mg Nebulization Given 09/20/17 2058)  iopamidol (ISOVUE-370) 76 % injection (100 mLs  Contrast Given 09/21/17 0040)     Initial Impression /  Assessment and Plan / ED Course  I have reviewed the triage vital signs and the nursing notes.  Pertinent labs & imaging results that were available during my care of the patient were reviewed by me and considered in my medical decision making (see chart for details).     Patient with history of heart failure here for evaluation of increased shortness of breath and dyspnea on exertion.  He has tachypnea and JVD on examination with clear lungs.  Concern for acute CHF exacerbation and he was treated with Lasix.  Medicine consulted for admission for further treatment.  Final Clinical Impressions(s) / ED Diagnoses   Final diagnoses:  Acute congestive heart failure, unspecified heart failure type [facility])    ED Discharge Orders    None       Tilden Fossa, MD 09/21/17 [phone]

## 2017-09-21 ENCOUNTER — Other Ambulatory Visit: Payer: Self-pay

## 2017-09-21 ENCOUNTER — Encounter (HOSPITAL_COMMUNITY): Payer: Self-pay | Admitting: Radiology

## 2017-09-21 ENCOUNTER — Inpatient Hospital Stay (HOSPITAL_COMMUNITY): Payer: Self-pay

## 2017-09-21 DIAGNOSIS — F1410 Cocaine abuse, uncomplicated: Secondary | ICD-10-CM

## 2017-09-21 DIAGNOSIS — I361 Nonrheumatic tricuspid (valve) insufficiency: Secondary | ICD-10-CM

## 2017-09-21 DIAGNOSIS — I5043 Acute on chronic combined systolic (congestive) and diastolic (congestive) heart failure: Principal | ICD-10-CM

## 2017-09-21 LAB — RAPID URINE DRUG SCREEN, HOSP PERFORMED
Amphetamines: NOT DETECTED
BARBITURATES: NOT DETECTED
Benzodiazepines: NOT DETECTED
COCAINE: POSITIVE — AB
Opiates: NOT DETECTED
TETRAHYDROCANNABINOL: NOT DETECTED

## 2017-09-21 LAB — BASIC METABOLIC PANEL
ANION GAP: 10 (ref 5–15)
BUN: 18 mg/dL (ref 6–20)
CALCIUM: 8.8 mg/dL — AB (ref 8.9–10.3)
CHLORIDE: 104 mmol/L (ref 101–111)
CO2: 24 mmol/L (ref 22–32)
CREATININE: 1.43 mg/dL — AB (ref 0.61–1.24)
GFR calc non Af Amer: 59 mL/min — ABNORMAL LOW (ref >60)
Glucose, Bld: 108 mg/dL — ABNORMAL HIGH (ref 65–99)
Potassium: 4.5 mmol/L (ref 3.5–5.1)
SODIUM: 138 mmol/L (ref 135–145)

## 2017-09-21 LAB — HIV ANTIBODY (ROUTINE TESTING W REFLEX): HIV Screen 4th Generation wRfx: NONREACTIVE

## 2017-09-21 LAB — ECHOCARDIOGRAM COMPLETE
HEIGHTINCHES: 69 in
WEIGHTICAEL: 2818.36 [oz_av]

## 2017-09-21 MED ORDER — IOPAMIDOL (ISOVUE-370) INJECTION 76%
INTRAVENOUS | Status: AC
  Administered 2017-09-21: 100 mL
  Filled 2017-09-21: qty 100

## 2017-09-21 MED ORDER — ENSURE ENLIVE PO LIQD
237.0000 mL | Freq: Two times a day (BID) | ORAL | Status: DC
  Administered 2017-09-21 (×2): 237 mL via ORAL

## 2017-09-21 MED ORDER — IOPAMIDOL (ISOVUE-370) INJECTION 76%
INTRAVENOUS | Status: AC
  Filled 2017-09-21: qty 100

## 2017-09-21 MED ORDER — PROMETHAZINE-CODEINE 6.25-10 MG/5ML PO SYRP
5.0000 mL | ORAL_SOLUTION | Freq: Once | ORAL | Status: AC
  Administered 2017-09-21: 5 mL via ORAL
  Filled 2017-09-21: qty 5

## 2017-09-21 MED ORDER — FUROSEMIDE 40 MG PO TABS
40.0000 mg | ORAL_TABLET | Freq: Every day | ORAL | Status: DC
  Administered 2017-09-22: 40 mg via ORAL
  Filled 2017-09-21 (×2): qty 1

## 2017-09-21 NOTE — Progress Notes (Signed)
2D Echocardiogram has been performed.  Mark [name] 09/21/2017, 9:02 AM

## 2017-09-21 NOTE — Plan of Care (Signed)
  Progressing Education: Knowledge of General Education information will improve 09/21/2017 0156 - Progressing by Peggye Pitt, RN   Not Progressing Education: Ability to demonstrate management of disease process will improve 09/21/2017 0156 - Not Progressing by Peggye Pitt, RN Activity: Capacity to carry out activities will improve 09/21/2017 0156 - Not Progressing by Peggye Pitt, RN   Completed/Met Pain Managment: General experience of comfort will improve 09/21/2017 0156 - Completed/Met by Peggye Pitt, RN Skin Integrity: Risk for impaired skin integrity will decrease 09/21/2017 0156 - Completed/Met by Peggye Pitt, RN

## 2017-09-21 NOTE — Consult Note (Signed)
Cardiology Consultation:   Patient ID: Mark [name]; [id number]; May 02, 1976   Admit date: 09/20/2017 Date of Consult: 09/21/2017  Primary Care Provider: Patient, No Pcp Per Primary Cardiologist: Verne Carrow, MD  Primary Electrophysiologist:  N/A   Patient Profile:   Mark [name] is a 42 y.o. male with a hx of asthma, and chronic systolic heart failure, nonischemic cardiomyopathy and cocaine use who is being seen today for the evaluation of chest pain and acute heart failure at the request of Dr. Isidoro Donning.  History of Present Illness:   Mark [name] is a 42 year old African-American male with past medical history of asthma, and chronic systolic heart failure, nonischemic cardiomyopathy and cocaine use.  Cardiology service last saw the patient in 2017 when he had chest pain and shortness of breath at the time.  Echocardiogram in 2017 showed EF 20-25%.  He eventually underwent left and right heart cath on 01/05/2016 which showed a normal coronaries.  He says shortly after he is released from the hospital, he was sent back to prison and it did not get released from the prison until August 2018.  Therefore he never picked up his carvedilol or Lasix.  He was seen briefly in June 2017 for asthma exacerbation.  The discharge note mentioned his ACE inhibitor was discontinued at the time due to concern of angioedema.   Patient presents this time with 1 week onset of increasing shortness of breath in the lower extremity edema.  He occasionally has a sharp stabbing chest pain on the left side which only last a few seconds at the time.  Echocardiogram obtained on 09/20/2017 showed EF remain 20%.  He underwent aggressive IV diuresis with 40 mg twice daily of IV Lasix, he is currently -3 L.  Cardiology has been consulted for acute on chronic systolic heart failure.  CT angiogram of the chest was negative for PE.  BNP was 2300.  Troponin negative.  On physical exam, patient is complaining of significant left  upper abdominal pain around the area of mass protrusion.  For some reason he says this is actually new.    Past Medical History:  Diagnosis Date  . Asthma   . Chronic systolic CHF (congestive heart failure) (HCC)   . Cigarette smoker   . History of echocardiogram    a. Echo 5/17 - EF 20-25%, severe diff HK, restrictive physiology, mild to mod MR, severe reduced RVSF, mod RVE, mild RAE, mod TR, PASP 48 mmHg  . Hx of cardiac cath    a. LHC 5/17 - normal coronary arteries. PA 45/25, mean 33, PCWP mean 18  . NICM (nonischemic cardiomyopathy) (HCC)   . Substance abuse (HCC)    cocaine, marijuana    Past Surgical History:  Procedure Laterality Date  . CARDIAC CATHETERIZATION N/A 01/05/2016   Procedure: Right/Left Heart Cath and Coronary Angiography;  Surgeon: Lennette Bihari, MD;  Location: [facility] INVASIVE CV LAB;  Service: Cardiovascular;  Laterality: N/A;  . none       Home Medications:  Prior to Admission medications   Medication Sig Start Date End Date Taking? Authorizing Provider  albuterol (PROAIR HFA) 108 (90 Base) MCG/ACT inhaler Inhale 2 puffs into the lungs every 4 (four) hours as needed for wheezing or shortness of breath.    Yes [provider]  aspirin 81 MG EC tablet Take 1 tablet (81 mg total) by mouth daily. 02/08/16  Yes Gwynn Burly, DO  albuterol (PROVENTIL HFA;VENTOLIN HFA) 108 (90 Base) MCG/ACT inhaler Inhale 1-2 puffs  into the lungs every 6 (six) hours as needed for wheezing or shortness of breath. Patient not taking: Reported on 09/20/2017 02/08/16   Gwynn Burly, DO  albuterol (PROVENTIL) (2.5 MG/3ML) 0.083% nebulizer solution Inhale 3 mLs (2.5 mg total) into the lungs every 6 (six) hours as needed for wheezing or shortness of breath. Patient not taking: Reported on 09/20/2017 02/08/16   Gwynn Burly, DO  beclomethasone (QVAR) 80 MCG/ACT inhaler Inhale 1 puff into the lungs 2 (two) times daily. Patient not taking: Reported on 09/20/2017 02/08/16   Gwynn Burly, DO  benzonatate (TESSALON) 100 MG capsule Take 1 capsule (100 mg total) by mouth 3 (three) times daily as needed for cough. 340B Chris Patient not taking: Reported on 09/20/2017 02/09/16   Inez Catalina, MD  carvedilol (COREG) 6.25 MG tablet Take 1 tablet (6.25 mg total) by mouth 2 (two) times daily with a meal. Patient not taking: Reported on 09/20/2017 02/08/16   Gwynn Burly, DO  diclofenac sodium (VOLTAREN) 1 % GEL Apply 2 g topically 4 (four) times daily. Patient not taking: Reported on 09/20/2017 02/08/16   Gwynn Burly, DO  furosemide (LASIX) 40 MG tablet Take 1 tablet (40 mg total) by mouth daily. Patient not taking: Reported on 09/20/2017 02/08/16   Gwynn Burly, DO  predniSONE (DELTASONE) 10 MG tablet Take 1 tablet (10 mg total) by mouth daily with breakfast. 3 tablets for 3 days, 2 tablets for 3 days, 1 tablet for 3 days. Patient not taking: Reported on 09/20/2017 09/09/17   Donnetta Hutching, MD    Inpatient Medications: Scheduled Meds: . budesonide  0.5 mg Inhalation BID  . enoxaparin (LOVENOX) injection  40 mg Subcutaneous Q24H  . feeding supplement (ENSURE ENLIVE)  237 mL Oral BID BM  . furosemide  40 mg Intravenous Q12H  . sodium chloride flush  3 mL Intravenous Q12H   Continuous Infusions: . sodium chloride     PRN Meds: sodium chloride, acetaminophen, albuterol, ALPRAZolam, benzonatate, hydrALAZINE, ondansetron (ZOFRAN) IV, sodium chloride flush, zolpidem  Allergies:    Allergies  Allergen Reactions  . Hydrocodone Hives  . Lisinopril Swelling and Other (See Comments)    Facial swelling/angioedema  . Prednisone Shortness Of Breath, Nausea Only, Swelling and Other (See Comments)    Also made chest feel tight and genital area, legs, and face became swollen badly  . Penicillins Hives and Swelling     Has patient had a PCN reaction causing immediate rash, facial/tongue/throat swelling, SOB or lightheadedness with hypotension: Yes Has patient had a PCN reaction  causing severe rash involving mucus membranes or skin necrosis: No Has patient had a PCN reaction that required hospitalization: No Has patient had a PCN reaction occurring within the last 10 years: No If all of the above answers are "NO", then may proceed with Cephalosporin use.     Social History:   Social History   Socioeconomic History  . Marital status: Single    Spouse name: Not on file  . Number of children: Not on file  . Years of education: Not on file  . Highest education level: Not on file  Social Needs  . Financial resource strain: Not on file  . Food insecurity - worry: Not on file  . Food insecurity - inability: Not on file  . Transportation needs - medical: Not on file  . Transportation needs - non-medical: Not on file  Occupational History  . Not on file  Tobacco Use  . Smoking status: Current Every Day Smoker  Packs/day: 0.50    Years: 20.00    Pack years: 10.00    Types: Cigarettes  . Smokeless tobacco: Never Used  Substance and Sexual Activity  . Alcohol use: No  . Drug use: Yes    Types: Cocaine, Marijuana  . Sexual activity: Not on file  Other Topics Concern  . Not on file  Social History Narrative  . Not on file    Family History:    Family History  Problem Relation Age of Onset  . Cardiomyopathy Father        Reports his father has an LVAD  . Heart failure Father   . Hypertension Father   . Deep vein thrombosis Neg Hx      ROS:  Please see the history of present illness.   All other ROS reviewed and negative.     Physical Exam/Data:   Vitals:   09/21/17 0131 09/21/17 0157 09/21/17 0500 09/21/17 1000  BP:    (!) 122/98  Pulse:    (!) 104  Resp:  20  (!) 22  Temp:    97.8 F (36.6 C)  TempSrc:  Oral  Oral  SpO2:      Weight:   176 lb 2.4 oz (79.9 kg)   Height: 5\' 9"  (1.753 m) 5\' 9"  (1.753 m)      Intake/Output Summary (Last 24 hours) at 09/21/2017 1256 Last data filed at 09/21/2017 0900 Gross per 24 hour  Intake 360 ml    Output 3425 ml  Net -3065 ml   Filed Weights   09/21/17 0000 09/21/17 0500  Weight: 176 lb 1.6 oz (79.9 kg) 176 lb 2.4 oz (79.9 kg)   Body mass index is 26.01 kg/m.  General:  Well nourished, well developed, in no acute distress HEENT: normal Lymph: no adenopathy Neck: no JVD Endocrine:  No thryomegaly Vascular: No carotid bruits; FA pulses 2+ bilaterally without bruits  Cardiac:  normal S1, S2; RRR; no murmur  Lungs:  clear to auscultation bilaterally, no wheezing, rhonchi or rales  Abd: soft, nontender, no hepatomegaly  Ext: no edema Musculoskeletal:  No deformities, BUE and BLE strength normal and equal Skin: warm and dry  Neuro:  CNs 2-12 intact, no focal abnormalities noted Psych:  Normal affect   EKG:  The EKG was personally reviewed and demonstrates: Sinus tachycardia with poor R wave progression anterior leads Telemetry:  Telemetry was personally reviewed and demonstrates: Sinus tachycardia  Relevant CV Studies:  Echo 09/21/2017 LV EF: 20%  Study Conclusions  - Left ventricle: The cavity size was severely dilated. Wall   thickness was increased in a pattern of mild LVH. The estimated   ejection fraction was 20%. Diffuse hypokinesis. Doppler   parameters are consistent with both elevated ventricular   end-diastolic filling pressure and elevated left atrial filling   pressure. - Mitral valve: Calcified annulus. Mildly thickened leaflets .   There was mild regurgitation. - Left atrium: The atrium was mildly dilated. - Right ventricle: The cavity size was mildly dilated. - Right atrium: The atrium was mildly dilated. - Atrial septum: No defect or patent foramen ovale was identified. - Pulmonary arteries: PA peak pressure: 41 mm Hg (S).  Laboratory Data:  Chemistry Recent Labs  Lab 09/20/17 1841 09/21/17 0147  NA 138 138  K 4.6 4.5  CL 106 104  CO2 25 24  GLUCOSE 109* 108*  BUN 16 18  CREATININE 1.31* 1.43*  CALCIUM 8.6* 8.8*  GFRNONAA >60 59*   GFRAA >60 >60  ANIONGAP 7 10    No results for input(s): PROT, ALBUMIN, AST, ALT, ALKPHOS, BILITOT in the last 168 hours. Hematology Recent Labs  Lab 09/20/17 1841  WBC 7.9  RBC 5.04  HGB 12.9*  HCT 40.0  MCV 79.4  MCH 25.6*  MCHC 32.3  RDW 15.0  PLT 198   Cardiac EnzymesNo results for input(s): TROPONINI in the last 168 hours.  Recent Labs  Lab 09/20/17 1848  TROPIPOC 0.04    BNP Recent Labs  Lab 09/20/17 2107  BNP 2,303.5*    DDimer No results for input(s): DDIMER in the last 168 hours.  Radiology/Studies:  Dg Chest 2 View  Result Date: 09/20/2017 CLINICAL DATA:  Facial and throat swelling, initial encounter EXAM: CHEST  2 VIEW COMPARISON:  09/09/2017 FINDINGS: Cardiac shadow is stable. The lungs are well aerated bilaterally. No focal infiltrate or sizable effusion is seen. No acute bony abnormality is noted. IMPRESSION: No active cardiopulmonary disease. Electronically Signed   By: Alcide Clever M.D.   On: 09/20/2017 19:12   Ct Angio Chest Pe W Or Wo Contrast  Result Date: 09/21/2017 CLINICAL DATA:  Shortness of breath.  Positive D-dimer. EXAM: CT ANGIOGRAPHY CHEST WITH CONTRAST TECHNIQUE: Multidetector CT imaging of the chest was performed using the standard protocol during bolus administration of intravenous contrast. Multiplanar CT image reconstructions and MIPs were obtained to evaluate the vascular anatomy. CONTRAST:  100 mL Isovue 370 COMPARISON:  01/03/2016 FINDINGS: Cardiovascular: Good opacification of the central and segmental pulmonary arteries. No focal filling defects demonstrated. No evidence of significant pulmonary embolus. Cardiac enlargement with left ventricular dilatation. No pericardial effusion. Normal caliber thoracic aorta. Mediastinum/Nodes: No enlarged mediastinal, hilar, or axillary lymph nodes. Thyroid gland, trachea, and esophagus demonstrate no significant findings. Lungs/Pleura: Emphysematous changes in the lungs. No airspace disease or  consolidation. No pleural effusions. No pneumothorax. Airways are patent. Upper Abdomen: No acute abnormality. Musculoskeletal: No chest wall abnormality. No acute or significant osseous findings. Review of the MIP images confirms the above findings. IMPRESSION: 1. No evidence of significant pulmonary embolus. 2. Cardiac enlargement with left ventricular dilatation. 3. No evidence of active pulmonary disease. 4. Diffuse emphysematous changes in the lungs. Emphysema (ICD10-J43.9). Electronically Signed   By: Burman Nieves M.D.   On: 09/21/2017 01:11    Assessment and Plan:   1. Chest pain: he was complaining of occasional sharp left-sided chest pain that lasted a few seconds at a time, given negative cardiac catheterization in 2017 and a negative troponin, no further workup is needed in this case.   - he has a left upper abdominal mass near L lower rib that is painful, he says it just appeared today. Unclear what it is, not pulsatile nor has bruit. Recent CTA of chest did not mention abnormality in that area.  2. Acute on chronic systolic heart failure: Previously was placed on carvedilol and Lasix in 2017, unfortunately he never picked up this medication and was sent back to prison until Aug last year.  -Admitted this time for acute heart failure that has been worsening for the past week.  On exam his lower extremity edema has resolved.  His net I&O is -3 L.  -It appears he is achieving euvolemic level.  Switch to 40 mg daily of p.o. Lasix.  3. Cocaine use: Urine drug test was positive for cocaine.  His persistent caine use prohibit him from being a candidate for AICD.  4. Nonischemic cardiomyopathy: Negative cardiac catheterization with normal coronaries in 2017.  -He  was on lisinopril in 2017, however later for admission indicate a possible angioedema of side effect.  Would not consider ACE inhibitor or ARB in this case.  -Given cocaine use, would avoid selective beta-blocker due to potential  uninhibited alpha adrenergic stimulation.  Recommend restart carvedilol.  If blood pressure allows it, I would also consider addition of isosorbide dinitrate and hydralazine  5. Asthma: No obvious exacerbation.   For questions or updates, please contact [facility] Please consult [email or web address] for contact info under Cardiology/STEMI.   Ramond Dial, [place]  09/21/2017 12:56 PM   Attending Note:   The patient was seen and examined.  Agree with assessment and plan as noted above.  Changes made to the above note as needed.  Patient seen and independently examined with Azalee Course, PA .   We discussed all aspects of the encounter. I agree with the assessment and plan as stated above.  1.  Acute on chronic combined systolic and diastolic congestive heart failure: Mark [name] is admitted again with acute heart failure.  He never started his medications from his previous hospitalization.  He has continued to use cocaine.  He has been diuresed and is feeling a bit better.  As his blood pressure allows, will titrate in hydralazine and isosorbide. He apparently had an anaphylactic reaction to an ACE inhibitor so we will not give him ACE inhibitors or angiotensin blockers.  We could consider adding spironolactone in the future.   I think that his cocaine abuse is contributing to his chronic systolic/diastolic congestive heart failure.  I advised him of this and advised him to stay away from using cocaine.   I have spent a total of 40 minutes with patient reviewing hospital  notes , telemetry, EKGs, labs and examining patient as well as establishing an assessment and plan that was discussed with the patient. > 50% of time was spent in direct patient care.    Vesta Mixer, Montez Hageman., MD, [facility] 09/21/2017, 1:47 PM [address],  [address] Office [phone] Pager [phone]

## 2017-09-21 NOTE — Progress Notes (Signed)
Nutrition Brief Note  Patient identified on the Malnutrition Screening Toll (MST) Report  Wt Readings from Last 15 Encounters:  09/21/17 176 lb 2.4 oz (79.9 kg)  02/08/16 175 lb (79.4 kg)  01/06/16 192 lb 0.3 oz (87.1 kg)    Body mass index is  BMI Readings from Last 1 Encounters:  09/21/17 26.01 kg/m   Patient meets criteria for normal weight based on current BMI.  Current diet order is Diet Heart. Spoke with RN who reports that pt has a good appetite and is eating meals. Visited with patient while he was eating lunch, he confirmed he has a good appetite and is consuming 100% of meals.   Patient's previously reported weight loss was due to 2-month incarceration when he did not get his medications to manage his chronic illnesses (" I used to weigh about 203-205 lbs"). Since his release from prison in 03/2017, pt reports that he is eating well, gaining weight, and his old clothes fit again.   Labs and medications reviewed.  No nutrition interventions are warranted at this time. If nutrition issues arise, please consult RD.  Marjie Skiff, MS Dietetic Intern Pager: [phone]

## 2017-09-21 NOTE — ED Notes (Signed)
Attempted report and secretary advised the RN was not available because she was doing the first 15 mins of a blood transfusion.

## 2017-09-21 NOTE — Progress Notes (Signed)
[facility]                                                                              Patient Demographics  Mark [name], is a 42 y.o. male, DOB - Apr 26, 1976, ZOX:[id number]  Admit date - 09/20/2017   Admitting Physician Briscoe Deutscher, MD  Outpatient Primary MD for the patient is Patient, No Pcp Per  Outpatient specialists:   LOS - 1  days   Medical records reviewed and are as summarized below:    Chief Complaint  Patient presents with  . Shortness of Breath       Brief summary   Patient is a 42 year old male with history of cocaine abuse, asthma, chronic systolic CHF, cocaine use presented with bilateral peripheral edema, shortness of breath and chest pain.  Patient was admitted in May 2017 and was found to have systolic CHF, EF 81-19%, normal coronaries on cath.  Patient reports that he was incarcerated shortly after leaving the hospital for 14 months and never filled any of his prescriptions.  In ED, patient was tachypneic with RR 30s, tachycardiac with HR in 110s. BNP 2305 CT angiogram negative for PE.  Assessment & Plan    Principal Problem:   Acute on chronic systolic CHF (congestive heart failure) (HCC) -Worsening symptoms secondary to severe noncompliance, cocaine use presented with shortness of breath, peripheral edema and chest pain -Patient started on IV Lasix 40 mg every 12 hours, peripheral edema has significantly improved per patient.  Still feels dyspneic on exertion. -Negative balance of 3 L since admission, weight 176 lbs on admission -2D echo 1/31 today showed EF of 20% with diffuse hypokinesis, cardiology consulted for further recommendations   Active Problems:   Asthma -Currently no acute wheezing, treated with recent prednisone taper and albuterol nebs in ED - Continue Qvar and as needed albuterol     Chest pain-atypical -Reported intermittent chest tightness for past few days.  Troponin negative, CT angiogram of the chest  negative for PE -Possibly due to cocaine use and #1    CKD (chronic kidney disease) stage 2, GFR 60-89 ml/min -Creatinine 1.3 baseline, follow closely with diuresis    Cocaine abuse (HCC) -UDS positive for cocaine, states he did in the last week.  Patient strongly counseled against cocaine use  Code Status: full  DVT Prophylaxis:  Lovenox  Family Communication: Discussed in detail with the patient, all imaging results, lab results explained to the patient.      Disposition Plan:   Time Spent in minutes  35 minutes  Procedures:  2D echo Left ventricle: The cavity size was severely dilated. Wall   thickness was increased in a pattern of mild LVH. The estimated   ejection fraction was 20%. Diffuse hypokinesis.  Consultants:   cardiology  Antimicrobials:      Medications  Scheduled Meds: . budesonide  0.5 mg Inhalation BID  . enoxaparin (LOVENOX) injection  40 mg Subcutaneous Q24H  . feeding supplement (ENSURE ENLIVE)  237 mL Oral BID BM  . furosemide  40 mg Intravenous Q12H  . iopamidol      . sodium chloride flush  3 mL  Intravenous Q12H   Continuous Infusions: . sodium chloride     PRN Meds:.sodium chloride, acetaminophen, albuterol, ALPRAZolam, benzonatate, hydrALAZINE, ondansetron (ZOFRAN) IV, sodium chloride flush, zolpidem   Antibiotics   Anti-infectives (From admission, onward)   None        Subjective:   Mark [name] was seen and examined today.  States swelling the legs has significantly improved however still somewhat with exertion.  Chest pain better. Patient denies dizziness, abdominal pain, N/V/D/C, new weakness, numbess, tingling.   Objective:   Vitals:   09/21/17 0131 09/21/17 0157 09/21/17 0500 09/21/17 1000  BP:    (!) 122/98  Pulse:    (!) 104  Resp:  20  (!) 22  Temp:    97.8 F (36.6 C)  TempSrc:  Oral  Oral  SpO2:      Weight:   79.9 kg (176 lb 2.4 oz)   Height: 5\' 9"  (1.753 m) 5\' 9"  (1.753 m)      Intake/Output Summary  (Last 24 hours) at 09/21/2017 1058 Last data filed at 09/21/2017 0900 Gross per 24 hour  Intake 360 ml  Output 3425 ml  Net -3065 ml     Wt Readings from Last 3 Encounters:  09/21/17 79.9 kg (176 lb 2.4 oz)  02/08/16 79.4 kg (175 lb)  01/06/16 87.1 kg (192 lb 0.3 oz)     Exam  General: Alert and oriented x 3, NAD somewhat dyspneic with speech  Eyes:   HEENT:  Atraumatic, normocephalic, JVD +  Cardiovascular: S1 S2 auscultated,Regular rate and rhythm.  Respiratory: Decreased breath sound at the bases  Gastrointestinal: Soft, nontender, nondistended, + bowel sounds  Ext: 1+ pedal edema bilaterally  Neuro: no new deficits  Musculoskeletal: No digital cyanosis, clubbing  Skin: No rashes  Psych: Normal affect and demeanor, alert and oriented x3    Data Reviewed:  I have personally reviewed following labs and imaging studies  Micro Results No results found for this or any previous visit (from the past 240 hour(s)).  Radiology Reports Dg Chest 2 View  Result Date: 09/20/2017 CLINICAL DATA:  Facial and throat swelling, initial encounter EXAM: CHEST  2 VIEW COMPARISON:  09/09/2017 FINDINGS: Cardiac shadow is stable. The lungs are well aerated bilaterally. No focal infiltrate or sizable effusion is seen. No acute bony abnormality is noted. IMPRESSION: No active cardiopulmonary disease. Electronically Signed   By: Alcide Clever M.D.   On: 09/20/2017 19:12   Dg Chest 2 View  Result Date: 09/09/2017 CLINICAL DATA:  Shortness of breath with productive cough for months. History of asthma. EXAM: CHEST  2 VIEW COMPARISON:  Radiographs 02/07/2016 and 01/04/2016. FINDINGS: Stable mild cardiac enlargement. The mediastinal contours are stable. There is chronic central airway and fissural thickening, but no airspace disease, pleural effusion or pneumothorax. The bones appear unchanged. IMPRESSION: No apparent acute findings. Stable chronic central airway and fissural thickening consistent  with reactive airways disease or bronchitis. Mild cardiomegaly. Electronically Signed   By: Carey Bullocks M.D.   On: 09/09/2017 14:07   Ct Angio Chest Pe W Or Wo Contrast  Result Date: 09/21/2017 CLINICAL DATA:  Shortness of breath.  Positive D-dimer. EXAM: CT ANGIOGRAPHY CHEST WITH CONTRAST TECHNIQUE: Multidetector CT imaging of the chest was performed using the standard protocol during bolus administration of intravenous contrast. Multiplanar CT image reconstructions and MIPs were obtained to evaluate the vascular anatomy. CONTRAST:  100 mL Isovue 370 COMPARISON:  01/03/2016 FINDINGS: Cardiovascular: Good opacification of the central and segmental pulmonary arteries. No  focal filling defects demonstrated. No evidence of significant pulmonary embolus. Cardiac enlargement with left ventricular dilatation. No pericardial effusion. Normal caliber thoracic aorta. Mediastinum/Nodes: No enlarged mediastinal, hilar, or axillary lymph nodes. Thyroid gland, trachea, and esophagus demonstrate no significant findings. Lungs/Pleura: Emphysematous changes in the lungs. No airspace disease or consolidation. No pleural effusions. No pneumothorax. Airways are patent. Upper Abdomen: No acute abnormality. Musculoskeletal: No chest wall abnormality. No acute or significant osseous findings. Review of the MIP images confirms the above findings. IMPRESSION: 1. No evidence of significant pulmonary embolus. 2. Cardiac enlargement with left ventricular dilatation. 3. No evidence of active pulmonary disease. 4. Diffuse emphysematous changes in the lungs. Emphysema (ICD10-J43.9). Electronically Signed   By: Burman Nieves M.D.   On: 09/21/2017 01:11    Lab Data:  CBC: Recent Labs  Lab 09/20/17 1841  WBC 7.9  HGB 12.9*  HCT 40.0  MCV 79.4  PLT 198   Basic Metabolic Panel: Recent Labs  Lab 09/20/17 1841 09/21/17 0147  NA 138 138  K 4.6 4.5  CL 106 104  CO2 25 24  GLUCOSE 109* 108*  BUN 16 18  CREATININE 1.31*  1.43*  CALCIUM 8.6* 8.8*   GFR: Estimated Creatinine Clearance: 66.6 mL/min (A) (by C-G formula based on SCr of 1.43 mg/dL (H)). Liver Function Tests: No results for input(s): AST, ALT, ALKPHOS, BILITOT, PROT, ALBUMIN in the last 168 hours. No results for input(s): LIPASE, AMYLASE in the last 168 hours. No results for input(s): AMMONIA in the last 168 hours. Coagulation Profile: No results for input(s): INR, PROTIME in the last 168 hours. Cardiac Enzymes: No results for input(s): CKTOTAL, CKMB, CKMBINDEX, TROPONINI in the last 168 hours. BNP (last 3 results) No results for input(s): PROBNP in the last 8760 hours. HbA1C: No results for input(s): HGBA1C in the last 72 hours. CBG: No results for input(s): GLUCAP in the last 168 hours. Lipid Profile: No results for input(s): CHOL, HDL, LDLCALC, TRIG, CHOLHDL, LDLDIRECT in the last 72 hours. Thyroid Function Tests: No results for input(s): TSH, T4TOTAL, FREET4, T3FREE, THYROIDAB in the last 72 hours. Anemia Panel: No results for input(s): VITAMINB12, FOLATE, FERRITIN, TIBC, IRON, RETICCTPCT in the last 72 hours. Urine analysis: No results found for: COLORURINE, APPEARANCEUR, LABSPEC, PHURINE, GLUCOSEU, HGBUR, BILIRUBINUR, KETONESUR, PROTEINUR, UROBILINOGEN, NITRITE, LEUKOCYTESUR   Kathleene Bergemann M.D. [facility] 09/21/2017, 10:58 AM  Pager: [phone] Between 7am to 7pm - call Pager - [phone]  After 7pm go to [email or web address] - password [email or web address]  Call night coverage person covering after 7pm

## 2017-09-22 DIAGNOSIS — J45909 Unspecified asthma, uncomplicated: Secondary | ICD-10-CM

## 2017-09-22 DIAGNOSIS — I5023 Acute on chronic systolic (congestive) heart failure: Secondary | ICD-10-CM

## 2017-09-22 DIAGNOSIS — I509 Heart failure, unspecified: Secondary | ICD-10-CM

## 2017-09-22 LAB — BASIC METABOLIC PANEL
Anion gap: 10 (ref 5–15)
BUN: 18 mg/dL (ref 6–20)
CALCIUM: 8.8 mg/dL — AB (ref 8.9–10.3)
CHLORIDE: 102 mmol/L (ref 101–111)
CO2: 26 mmol/L (ref 22–32)
CREATININE: 1.45 mg/dL — AB (ref 0.61–1.24)
GFR calc Af Amer: >60 mL/min (ref >60)
GFR, EST NON AFRICAN AMERICAN: 58 mL/min — AB (ref >60)
Glucose, Bld: 104 mg/dL — ABNORMAL HIGH (ref 65–99)
Potassium: 4.4 mmol/L (ref 3.5–5.1)
SODIUM: 138 mmol/L (ref 135–145)

## 2017-09-22 MED ORDER — ALBUTEROL SULFATE HFA 108 (90 BASE) MCG/ACT IN AERS: 1.0000 | INHALATION_SPRAY | Freq: Four times a day (QID) | RESPIRATORY_TRACT | 4 refills | Status: DC | PRN

## 2017-09-22 MED ORDER — FUROSEMIDE 40 MG PO TABS: 40.0000 mg | ORAL_TABLET | Freq: Every day | ORAL | 4 refills | Status: DC

## 2017-09-22 MED ORDER — BENZONATATE 100 MG PO CAPS: 100.0000 mg | ORAL_CAPSULE | Freq: Three times a day (TID) | ORAL | Status: DC

## 2017-09-22 MED ORDER — GUAIFENESIN-DM 100-10 MG/5ML PO SYRP: 5.0000 mL | ORAL_SOLUTION | ORAL | Status: DC | PRN

## 2017-09-22 MED ORDER — BECLOMETHASONE DIPROPIONATE 80 MCG/ACT IN AERS: 1.0000 | INHALATION_SPRAY | Freq: Two times a day (BID) | RESPIRATORY_TRACT | 4 refills | Status: DC

## 2017-09-22 MED ORDER — GUAIFENESIN-DM 100-10 MG/5ML PO SYRP: 5.0000 mL | ORAL_SOLUTION | ORAL | 0 refills | Status: DC | PRN

## 2017-09-22 MED ORDER — ISOSORB DINITRATE-HYDRALAZINE 20-37.5 MG PO TABS: 1.0000 | ORAL_TABLET | Freq: Three times a day (TID) | ORAL | 4 refills | Status: DC

## 2017-09-22 MED ORDER — CARVEDILOL 3.125 MG PO TABS: 3.1250 mg | ORAL_TABLET | Freq: Two times a day (BID) | ORAL | 4 refills | Status: DC

## 2017-09-22 MED ORDER — ASPIRIN 81 MG PO TBEC: 81.0000 mg | DELAYED_RELEASE_TABLET | Freq: Every day | ORAL | 4 refills | Status: DC

## 2017-09-22 MED ORDER — CARVEDILOL 3.125 MG PO TABS: 3.1250 mg | ORAL_TABLET | Freq: Two times a day (BID) | ORAL | Status: DC

## 2017-09-22 MED ORDER — BENZONATATE 100 MG PO CAPS: 100.0000 mg | ORAL_CAPSULE | Freq: Three times a day (TID) | ORAL | 0 refills | Status: DC | PRN

## 2017-09-22 MED ORDER — ISOSORB DINITRATE-HYDRALAZINE 20-37.5 MG PO TABS
1.0000 | ORAL_TABLET | Freq: Three times a day (TID) | ORAL | Status: DC
  Administered 2017-09-22: 1 via ORAL
  Filled 2017-09-22: qty 1

## 2017-09-22 NOTE — Progress Notes (Signed)
Patient given discharge instructions medication list and follow up appointments. Patient prescriptions were faxed to personal pharmacy upon patient request. All questions were answered heart failure packet given to patient along with exit care documents on eating with less salt, heart healthy diet and heart failure diet. Will discharge home as ordered transported to exit via wheel chair. IV And tele were dcd. Deneise Getty, Randall An RN

## 2017-09-22 NOTE — Progress Notes (Signed)
Progress Note  Patient Name: Mark [name] Date of Encounter: 09/22/2017  Primary Cardiologist: Verne Carrow, MD   Subjective   Pt says he is much improved. He wants to go home.  Inpatient Medications    Scheduled Meds: . benzonatate  100 mg Oral TID  . budesonide  0.5 mg Inhalation BID  . enoxaparin (LOVENOX) injection  40 mg Subcutaneous Q24H  . feeding supplement (ENSURE ENLIVE)  237 mL Oral BID BM  . furosemide  40 mg Oral Daily  . isosorbide-hydrALAZINE  1 tablet Oral TID  . sodium chloride flush  3 mL Intravenous Q12H   Continuous Infusions: . sodium chloride     PRN Meds: sodium chloride, acetaminophen, albuterol, ALPRAZolam, guaiFENesin-dextromethorphan, hydrALAZINE, ondansetron (ZOFRAN) IV, sodium chloride flush, zolpidem   Vital Signs    Vitals:   09/22/17 0114 09/22/17 0426 09/22/17 0751 09/22/17 0800  BP:  96/72  124/87  Pulse:  (!) 109  97  Resp:  (!) 26  (!) 23  Temp:  99.3 F (37.4 C)  98.8 F (37.1 C)  TempSrc:  Oral  Oral  SpO2: 100% 96% 98%   Weight:  170 lb 11.2 oz (77.4 kg)    Height:        Intake/Output Summary (Last 24 hours) at 09/22/2017 0921 Last data filed at 09/22/2017 1610 Gross per 24 hour  Intake 778 ml  Output 700 ml  Net 78 ml   Filed Weights   09/21/17 0000 09/21/17 0500 09/22/17 0426  Weight: 176 lb 1.6 oz (79.9 kg) 176 lb 2.4 oz (79.9 kg) 170 lb 11.2 oz (77.4 kg)    Telemetry    NSR, ST - Personally Reviewed  ECG    NSR, ST, anterior lateral Qs- Personally Reviewed (no change from 2017)  Physical Exam   GEN: No acute distress.   Neck: JVD noted Cardiac: RRR, no murmurs, rubs, or gallops.  Respiratory: decreased breath sounds, no wheezing GI: Soft, nontender, non-distended  MS: No edema; No deformity. Neuro:  Nonfocal  Psych: Normal affect   Labs    Chemistry Recent Labs  Lab 09/20/17 1841 09/21/17 0147 09/22/17 0257  NA 138 138 138  K 4.6 4.5 4.4  CL 106 104 102  CO2 25 24 26   GLUCOSE  109* 108* 104*  BUN 16 18 18   CREATININE 1.31* 1.43* 1.45*  CALCIUM 8.6* 8.8* 8.8*  GFRNONAA >60 59* 58*  GFRAA >60 >60 >60  ANIONGAP 7 10 10      Hematology Recent Labs  Lab 09/20/17 1841  WBC 7.9  RBC 5.04  HGB 12.9*  HCT 40.0  MCV 79.4  MCH 25.6*  MCHC 32.3  RDW 15.0  PLT 198    Cardiac EnzymesNo results for input(s): TROPONINI in the last 168 hours.  Recent Labs  Lab 09/20/17 1848  TROPIPOC 0.04     BNP Recent Labs  Lab 09/20/17 2107  BNP 2,303.5*     DDimer No results for input(s): DDIMER in the last 168 hours.   Radiology    Dg Chest 2 View  Result Date: 09/20/2017 CLINICAL DATA:  Facial and throat swelling, initial encounter EXAM: CHEST  2 VIEW COMPARISON:  09/09/2017 FINDINGS: Cardiac shadow is stable. The lungs are well aerated bilaterally. No focal infiltrate or sizable effusion is seen. No acute bony abnormality is noted. IMPRESSION: No active cardiopulmonary disease. Electronically Signed   By: Alcide Clever M.D.   On: 09/20/2017 19:12   Dg Abd 1 View  Result Date: 09/21/2017  CLINICAL DATA:  Intermittent bilateral abdominal pain for 1 day. EXAM: ABDOMEN - 1 VIEW COMPARISON:  None. FINDINGS: Gas and an ordinary amount of stool are present in nondilated colon. Gas is present in nondilated small bowel loops. There is no evidence of bowel obstruction. No intraperitoneal free air is identified on this supine study. Excreted IV contrast is noted in the bladder and renal collecting systems. No abnormal soft tissue calcification is seen. No acute osseous abnormality is identified. IMPRESSION: Negative. Electronically Signed   By: Sebastian Ache M.D.   On: 09/21/2017 14:46   Ct Angio Chest Pe W Or Wo Contrast  Result Date: 09/21/2017 CLINICAL DATA:  Shortness of breath.  Positive D-dimer. EXAM: CT ANGIOGRAPHY CHEST WITH CONTRAST TECHNIQUE: Multidetector CT imaging of the chest was performed using the standard protocol during bolus administration of intravenous  contrast. Multiplanar CT image reconstructions and MIPs were obtained to evaluate the vascular anatomy. CONTRAST:  100 mL Isovue 370 COMPARISON:  01/03/2016 FINDINGS: Cardiovascular: Good opacification of the central and segmental pulmonary arteries. No focal filling defects demonstrated. No evidence of significant pulmonary embolus. Cardiac enlargement with left ventricular dilatation. No pericardial effusion. Normal caliber thoracic aorta. Mediastinum/Nodes: No enlarged mediastinal, hilar, or axillary lymph nodes. Thyroid gland, trachea, and esophagus demonstrate no significant findings. Lungs/Pleura: Emphysematous changes in the lungs. No airspace disease or consolidation. No pleural effusions. No pneumothorax. Airways are patent. Upper Abdomen: No acute abnormality. Musculoskeletal: No chest wall abnormality. No acute or significant osseous findings. Review of the MIP images confirms the above findings. IMPRESSION: 1. No evidence of significant pulmonary embolus. 2. Cardiac enlargement with left ventricular dilatation. 3. No evidence of active pulmonary disease. 4. Diffuse emphysematous changes in the lungs. Emphysema (ICD10-J43.9). Electronically Signed   By: Burman Nieves M.D.   On: 09/21/2017 01:11    Cardiac Studies   Echo 09/21/17- Study Conclusions  - Left ventricle: The cavity size was severely dilated. Wall   thickness was increased in a pattern of mild LVH. The estimated   ejection fraction was 20%. Diffuse hypokinesis. Doppler   parameters are consistent with both elevated ventricular   end-diastolic filling pressure and elevated left atrial filling   pressure. - Mitral valve: Calcified annulus. Mildly thickened leaflets .   There was mild regurgitation. - Left atrium: The atrium was mildly dilated. - Right ventricle: The cavity size was mildly dilated. - Right atrium: The atrium was mildly dilated. - Atrial septum: No defect or patent foramen ovale was identified. - Pulmonary  arteries: PA peak pressure: 41 mm Hg (S).  Rt and Lt heart cath 01/05/16- Dilated congestive nonischemic cardiomyopathy with echo Doppler assessment of EF at 20 - 25%.  Elevation of right heart pressures with moderate pulmonary hypertension.  Normal coronary arteries.  Recommendation: Aggressive medical therapy with lifestyle adjustment for his dilated cardIomyopathy.  If LV function does not improve, consider evaluation for prophylactic ICD implantation  Patient Profile     42 y.o. male AA male, seen initially in May 2017 for chest pain, an echocardiogram then showed an EF 20-25%.  He eventually underwent left and right heart cath on 01/05/2016 which showed a normal coronaries. Shortly after he is released from the hospital, he was sent back to prison and it did not get released from the prison until August 2018.  Therefore he never picked up his carvedilol or Lasix.   He presented to the ED 09/20/17 with DOE and tachycardia. He was positive for cocaine. Chest x-ray showed no acute findings.  He was worked up for PE with a CTA and VQ scan which showed low risk for VQ scan, along with moderate emphysematous disease. He did have an initial troponin of 0.54>>0.42. BNP on arrival was 1315, Cr elevated at 1.39.   He was admitted to the Hospitalist service and has diuresed 2.8L, 6 lbs. He is anxious to go home today.    Assessment & Plan      1. Acute on chronic systolic heart failure: Previously was placed on carvedilol and Lasix in 2017, unfortunately he never picked up this medication and was sent back to prison until Aug last year. Admitted this time for acute heart failure that has been worsening for the past week.  On exam his lower extremity edema has resolved.  His net I&O is -3 L. Home on Lasix 40 mg.   2. Cocaine use: Urine drug test was positive for cocaine.  His persistent cocaine use prohibit him from being a candidate for AICD, not sure about beta blocker therapy, will discuss  with MD.  3. Nonischemic cardiomyopathy: Negative cardiac catheterization with normal coronaries in 2017. Angioedema with ACE. BiDill added.  4. Asthmatic COPD-Chronic, no obvious asthma exacerbation but evidence of COPD on exam.   Plan: Will discuss beta blocker use and history of cocaine use with MD. He'll need a TOC f/u which we will arrange. If he can demonstrate compliance with medications and follow ups he could be considered for ICD implant.    For questions or updates, please contact [facility] Please consult [email or web address] for contact info under Cardiology/STEMI.      Signed, Corine Shelter, PA-C  09/22/2017, 9:21 AM    Attending Note:   The patient was seen and examined.  Agree with assessment and plan as noted above.  Changes made to the above note as needed.  Patient seen and independently examined with Corine Shelter, PA .   We discussed all aspects of the encounter. I agree with the assessment and plan as stated above.  1.   Acute on chronic CHF :    Has not been on meds.  Will start Coreg,  Bidil, lasix Has been advised to avoid cocaine  Follow up with Dr. Clifton James / or PA in a month    I have spent a total of 40 minutes with patient reviewing hospital  notes , telemetry, EKGs, labs and examining patient as well as establishing an assessment and plan that was discussed with the patient. > 50% of time was spent in direct patient care.     Vesta Mixer, Montez Hageman., MD, [facility] 09/22/2017, 9:56 AM [address]  [address] Office [phone] Pager [phone]

## 2017-09-22 NOTE — Discharge Instructions (Signed)
Heart Failure °Heart failure means your heart has trouble pumping blood. This makes it hard for your body to work well. Heart failure is usually a long-term (chronic) condition. You must take good care of yourself and follow your doctor's treatment plan. °Follow these instructions at home: °· Take your heart medicine as told by your doctor. °? Do not stop taking medicine unless your doctor tells you to. °? Do not skip any dose of medicine. °? Refill your medicines before they run out. °? Take other medicines only as told by your doctor or pharmacist. °· Stay active if told by your doctor. The elderly and people with severe heart failure should talk with a doctor about physical activity. °· Eat heart-healthy foods. Choose foods that are without trans fat and are low in saturated fat, cholesterol, and salt (sodium). This includes fresh or frozen fruits and vegetables, fish, lean meats, fat-free or low-fat dairy foods, whole grains, and high-fiber foods. Lentils and dried peas and beans (legumes) are also good choices. °· Limit salt if told by your doctor. °· Cook in a healthy way. Roast, grill, broil, bake, poach, steam, or stir-fry foods. °· Limit fluids as told by your doctor. °· Weigh yourself every morning. Do this after you pee (urinate) and before you eat breakfast. Write down your weight to give to your doctor. °· Take your blood pressure and write it down if your doctor tells you to. °· Ask your doctor how to check your pulse. Check your pulse as told. °· Lose weight if told by your doctor. °· Stop smoking or chewing tobacco. Do not use gum or patches that help you quit without your doctor's approval. °· Schedule and go to doctor visits as told. °· Nonpregnant women should have no more than 1 drink a day. Men should have no more than 2 drinks a day. Talk to your doctor about drinking alcohol. °· Stop illegal drug use. °· Stay current with shots (immunizations). °· Manage your health conditions as told by your  doctor. °· Learn to manage your stress. °· Rest when you are tired. °· If it is really hot outside: °? Avoid intense activities. °? Use air conditioning or fans, or get in a cooler place. °? Avoid caffeine and alcohol. °? Wear loose-fitting, lightweight, and light-colored clothing. °· If it is really cold outside: °? Avoid intense activities. °? Layer your clothing. °? Wear mittens or gloves, a hat, and a scarf when going outside. °? Avoid alcohol. °· Learn about heart failure and get support as needed. °· Get help to maintain or improve your quality of life and your ability to care for yourself as needed. °Contact a doctor if: °· You gain weight quickly. °· You are more short of breath than usual. °· You cannot do your normal activities. °· You tire easily. °· You cough more than normal, especially with activity. °· You have any or more puffiness (swelling) in areas such as your hands, feet, ankles, or belly (abdomen). °· You cannot sleep because it is hard to breathe. °· You feel like your heart is beating fast (palpitations). °· You get dizzy or light-headed when you stand up. °Get help right away if: °· You have trouble breathing. °· There is a change in mental status, such as becoming less alert or not being able to focus. °· You have chest pain or discomfort. °· You faint. °This information is not intended to replace advice given to you by your health care provider. Make sure you   discuss any questions you have with your health care provider. °Document Released: 05/17/2008 Document Revised: 01/14/2016 Document Reviewed: 09/24/2012 °Elsevier Interactive Patient Education © 2017 [employer] ° °

## 2017-09-22 NOTE — Discharge Summary (Signed)
Physician Discharge Summary   Patient ID: Mark [name] MRN: [id number] DOB/AGE: 1976-02-22 42 y.o.  Admit date: 09/20/2017 Discharge date: 09/22/2017  Primary Care Physician:  Patient, No Pcp Per  Discharge Diagnoses:    . CKD (chronic kidney disease) stage 2, GFR 60-89 ml/min . Chest pain . Cocaine abuse (HCC) . Acute on chronic systolic CHF (congestive heart failure) (HCC) . Asthma   Consults: Cardiology  Recommendations for Outpatient Follow-up:   1. Please repeat CBC/BMET at next visit    DIET: Heart healthy diet    Allergies:   Allergies  Allergen Reactions  . Hydrocodone Hives  . Lisinopril Swelling and Other (See Comments)    Facial swelling/angioedema  . Prednisone Shortness Of Breath, Nausea Only, Swelling and Other (See Comments)    Also made chest feel tight and genital area, legs, and face became swollen badly  . Penicillins Hives and Swelling     Has patient had a PCN reaction causing immediate rash, facial/tongue/throat swelling, SOB or lightheadedness with hypotension: Yes Has patient had a PCN reaction causing severe rash involving mucus membranes or skin necrosis: No Has patient had a PCN reaction that required hospitalization: No Has patient had a PCN reaction occurring within the last 10 years: No If all of the above answers are "NO", then may proceed with Cephalosporin use.      DISCHARGE MEDICATIONS: Allergies as of 09/22/2017      Reactions   Hydrocodone Hives   Lisinopril Swelling, Other (See Comments)   Facial swelling/angioedema   Prednisone Shortness Of Breath, Nausea Only, Swelling, Other (See Comments)   Also made chest feel tight and genital area, legs, and face became swollen badly   Penicillins Hives, Swelling   Has patient had a PCN reaction causing immediate rash, facial/tongue/throat swelling, SOB or lightheadedness with hypotension: Yes Has patient had a PCN reaction causing severe rash involving mucus membranes or skin  necrosis: No Has patient had a PCN reaction that required hospitalization: No Has patient had a PCN reaction occurring within the last 10 years: No If all of the above answers are "NO", then may proceed with Cephalosporin use.      Medication List    STOP taking these medications   predniSONE 10 MG tablet Commonly known as:  DELTASONE     TAKE these medications   albuterol 108 (90 Base) MCG/ACT inhaler Commonly known as:  PROVENTIL HFA;VENTOLIN HFA Inhale 1-2 puffs into the lungs every 6 (six) hours as needed for wheezing or shortness of breath. What changed:  Another medication with the same name was removed. Continue taking this medication, and follow the directions you see here.   aspirin 81 MG EC tablet Take 1 tablet (81 mg total) by mouth daily.   beclomethasone 80 MCG/ACT inhaler Commonly known as:  QVAR Inhale 1 puff into the lungs 2 (two) times daily.   benzonatate 100 MG capsule Commonly known as:  TESSALON Take 1 capsule (100 mg total) by mouth 3 (three) times daily as needed for cough. 340B Chris   carvedilol 3.125 MG tablet Commonly known as:  COREG Take 1 tablet (3.125 mg total) by mouth 2 (two) times daily with a meal. What changed:    medication strength  how much to take   furosemide 40 MG tablet Commonly known as:  LASIX Take 1 tablet (40 mg total) by mouth daily.   guaiFENesin-dextromethorphan 100-10 MG/5ML syrup Commonly known as:  ROBITUSSIN DM Take 5 mLs by mouth every 4 (four) hours as  needed for cough.   isosorbide-hydrALAZINE 20-37.5 MG tablet Commonly known as:  BIDIL Take 1 tablet by mouth 3 (three) times daily.        Brief H and P: For complete details please refer to admission H and P, but in brief Patient is a 42 year old male with history of cocaine abuse, asthma, chronic systolic CHF, cocaine use presented with bilateral peripheral edema, shortness of breath and chest pain.  Patient was admitted in May 2017 and was found to have  systolic CHF, EF 16-10%, normal coronaries on cath.  Patient reports that he was incarcerated shortly after leaving the hospital for 14 months and never filled any of his prescriptions.  In ED, patient was tachypneic with RR 30s, tachycardiac with HR in 110s. BNP 2305 CT angiogram negative for PE.   Hospital Course:   Acute on chronic systolic CHF (congestive heart failure) (HCC) - secondary to severe noncompliance, cocaine use presented with shortness of breath, peripheral edema and chest pain -Patient was started on IV Lasix 40 mg every 12 hours, shortness of breath and peripheral edema has significantly improved.  -Negative balance of 3 L since admission, weight 176 lbs on admission-170>  -2D echo 1/31 showed EF of 20% with diffuse hypokinesis, cardiology consulted for further recommendations -Cardiology was consulted and recommended cocaine cessation, started on low-dose Coreg, Lasix, Bidil.  Patient was strongly advised to stop cocaine. -All the prescriptions given to the patient    Asthma -Currently no acute wheezing, treated with recent prednisone taper and albuterol nebs in ED - Continue Qvar and as needed albuterol, prescriptions given to the patient    Chest pain-atypical -Reported intermittent chest tightness for past few days.  Troponin negative, CT angiogram of the chest negative for PE -Possibly due to cocaine use and #1    CKD (chronic kidney disease) stage 2, GFR 60-89 ml/min -Creatinine 1.3 baseline, stable    Cocaine abuse (HCC) -UDS positive for cocaine, states he did in the last week.  Patient strongly counseled against cocaine use    Day of Discharge BP 107/67 (BP Location: Right Arm)   Pulse (!) 104   Temp 98.8 F (37.1 C) (Oral)   Resp (!) 28   Ht 5\' 9"  (1.753 m)   Wt 77.4 kg (170 lb 11.2 oz)   SpO2 99%   BMI 25.21 kg/m   Physical Exam: General: Alert and awake oriented x3 not in any acute distress. HEENT: anicteric sclera, pupils reactive to  light and accommodation CVS: S1-S2 clear no murmur rubs or gallops Chest: clear to auscultation bilaterally, no wheezing rales or rhonchi Abdomen: soft nontender, nondistended, normal bowel sounds Extremities: no cyanosis, clubbing or edema noted bilaterally Neuro: Cranial nerves II-XII intact, no focal neurological deficits   The results of significant diagnostics from this hospitalization (including imaging, microbiology, ancillary and laboratory) are listed below for reference.    LAB RESULTS: Basic Metabolic Panel: Recent Labs  Lab 09/21/17 0147 09/22/17 0257  NA 138 138  K 4.5 4.4  CL 104 102  CO2 24 26  GLUCOSE 108* 104*  BUN 18 18  CREATININE 1.43* 1.45*  CALCIUM 8.8* 8.8*   Liver Function Tests: No results for input(s): AST, ALT, ALKPHOS, BILITOT, PROT, ALBUMIN in the last 168 hours. No results for input(s): LIPASE, AMYLASE in the last 168 hours. No results for input(s): AMMONIA in the last 168 hours. CBC: Recent Labs  Lab 09/20/17 1841  WBC 7.9  HGB 12.9*  HCT 40.0  MCV 79.4  PLT 198   Cardiac Enzymes: No results for input(s): CKTOTAL, CKMB, CKMBINDEX, TROPONINI in the last 168 hours. BNP: Invalid input(s): POCBNP CBG: No results for input(s): GLUCAP in the last 168 hours.  Significant Diagnostic Studies:  Dg Chest 2 View  Result Date: 09/20/2017 CLINICAL DATA:  Facial and throat swelling, initial encounter EXAM: CHEST  2 VIEW COMPARISON:  09/09/2017 FINDINGS: Cardiac shadow is stable. The lungs are well aerated bilaterally. No focal infiltrate or sizable effusion is seen. No acute bony abnormality is noted. IMPRESSION: No active cardiopulmonary disease. Electronically Signed   By: Alcide Clever M.D.   On: 09/20/2017 19:12   Dg Abd 1 View  Result Date: 09/21/2017 CLINICAL DATA:  Intermittent bilateral abdominal pain for 1 day. EXAM: ABDOMEN - 1 VIEW COMPARISON:  None. FINDINGS: Gas and an ordinary amount of stool are present in nondilated colon. Gas is  present in nondilated small bowel loops. There is no evidence of bowel obstruction. No intraperitoneal free air is identified on this supine study. Excreted IV contrast is noted in the bladder and renal collecting systems. No abnormal soft tissue calcification is seen. No acute osseous abnormality is identified. IMPRESSION: Negative. Electronically Signed   By: Sebastian Ache M.D.   On: 09/21/2017 14:46   Ct Angio Chest Pe W Or Wo Contrast  Result Date: 09/21/2017 CLINICAL DATA:  Shortness of breath.  Positive D-dimer. EXAM: CT ANGIOGRAPHY CHEST WITH CONTRAST TECHNIQUE: Multidetector CT imaging of the chest was performed using the standard protocol during bolus administration of intravenous contrast. Multiplanar CT image reconstructions and MIPs were obtained to evaluate the vascular anatomy. CONTRAST:  100 mL Isovue 370 COMPARISON:  01/03/2016 FINDINGS: Cardiovascular: Good opacification of the central and segmental pulmonary arteries. No focal filling defects demonstrated. No evidence of significant pulmonary embolus. Cardiac enlargement with left ventricular dilatation. No pericardial effusion. Normal caliber thoracic aorta. Mediastinum/Nodes: No enlarged mediastinal, hilar, or axillary lymph nodes. Thyroid gland, trachea, and esophagus demonstrate no significant findings. Lungs/Pleura: Emphysematous changes in the lungs. No airspace disease or consolidation. No pleural effusions. No pneumothorax. Airways are patent. Upper Abdomen: No acute abnormality. Musculoskeletal: No chest wall abnormality. No acute or significant osseous findings. Review of the MIP images confirms the above findings. IMPRESSION: 1. No evidence of significant pulmonary embolus. 2. Cardiac enlargement with left ventricular dilatation. 3. No evidence of active pulmonary disease. 4. Diffuse emphysematous changes in the lungs. Emphysema (ICD10-J43.9). Electronically Signed   By: Burman Nieves M.D.   On: 09/21/2017 01:11    2D  ECHO:   Disposition and Follow-up: Discharge Instructions    (HEART FAILURE PATIENTS) Call MD:  Anytime you have any of the following symptoms: 1) 3 pound weight gain in 24 hours or 5 pounds in 1 week 2) shortness of breath, with or without a dry hacking cough 3) swelling in the hands, feet or stomach 4) if you have to sleep on extra pillows at night in order to breathe.   Complete by:  As directed    Diet - low sodium heart healthy   Complete by:  As directed    Increase activity slowly   Complete by:  As directed        DISPOSITION:home   DISCHARGE FOLLOW-UP Follow-up Information    [facility]. Schedule an appointment as soon as possible for a visit in 2 week(s).   Contact information: [address] [place] [address] [phone]       Kathleene Hazel,  MD Follow up.   Specialty:  Cardiology Why:  office will contact you Contact information: [address] [place] [address] [phone]        [facility]. Go on 10/02/2017.   Specialty:  Internal Medicine Why:  f/u appointment made for 1:00 pm- please call clinic if unable to keep this appointment and reschedule- take d/c instructions and list of medications with you along with ID Contact information: [address] [place] [address] [phone]           Time spent on Discharge:   Signed:   Thad Ranger M.D. [facility] 09/22/2017, 2:52 PM Pager: [phone]

## 2017-09-22 NOTE — Care Management Note (Signed)
Case Management Note Donn Pierini RN, BSN Unit 4E-Case Manager [phone]  Patient Details  Name: Mark [name] MRN: [id number] Date of Birth: [birth year]  Subjective/Objective:  Pt admitted with acute on chronic HF                  Action/Plan: PTA pt lived at home- independent- hx polysubstance abuse- recent release from prison Aug. 2018 and has not established care with PCP. Call made to [facility]- they did not have any f/u appointments available- [facility] to [facility]- and f/u made for Feb. 11 at 1pm- spoke with pt at bedside info given on [facility] and appointment pt also has # to [facility] if he wants to try to get appointment there he could cancel the other. CM will assist pt with medications for discharge- pt states that he can afford $3 copay per script- MATCH letter provided to pt along with list of pharmacies to use- pt understand program is a one time use for discharge medications. Pt states he has transportation home.   Expected Discharge Date:  09/22/17               Expected Discharge Plan:  Home/Self Care  In-House Referral:  NA  Discharge planning Services  CM Consult, Medication Assistance, MATCH Program, [facility], Follow-up appt scheduled  Post Acute Care Choice:  NA Choice offered to:  NA  DME Arranged:    DME Agency:     HH Arranged:    HH Agency:     Status of Service:  Completed, signed off  If discussed at [employer] of Stay Meetings, dates discussed:    Discharge Disposition: home/self care   Additional Comments:  Darrold Span, RN 09/22/2017, 12:19 PM

## 2017-09-27 DIAGNOSIS — I639 Cerebral infarction, unspecified: Secondary | ICD-10-CM

## 2017-09-27 HISTORY — DX: Cerebral infarction, unspecified: I63.9

## 2017-09-28 ENCOUNTER — Inpatient Hospital Stay (HOSPITAL_COMMUNITY): Payer: Self-pay

## 2017-09-28 ENCOUNTER — Emergency Department (HOSPITAL_COMMUNITY): Payer: Self-pay

## 2017-09-28 ENCOUNTER — Inpatient Hospital Stay (HOSPITAL_COMMUNITY)
Admission: EM | Admit: 2017-09-28 | Discharge: 2017-10-01 | DRG: 064 | Discharge status: Against Medical Advice | Payer: Self-pay | Attending: Internal Medicine | Admitting: Internal Medicine

## 2017-09-28 ENCOUNTER — Encounter (HOSPITAL_COMMUNITY): Payer: Self-pay | Admitting: Emergency Medicine

## 2017-09-28 ENCOUNTER — Other Ambulatory Visit: Payer: Self-pay

## 2017-09-28 DIAGNOSIS — I63519 Cerebral infarction due to unspecified occlusion or stenosis of unspecified middle cerebral artery: Secondary | ICD-10-CM

## 2017-09-28 DIAGNOSIS — F14129 Cocaine abuse with intoxication, unspecified: Secondary | ICD-10-CM | POA: Diagnosis present

## 2017-09-28 DIAGNOSIS — R296 Repeated falls: Secondary | ICD-10-CM | POA: Diagnosis present

## 2017-09-28 DIAGNOSIS — J9602 Acute respiratory failure with hypercapnia: Secondary | ICD-10-CM | POA: Diagnosis present

## 2017-09-28 DIAGNOSIS — F1410 Cocaine abuse, uncomplicated: Secondary | ICD-10-CM

## 2017-09-28 DIAGNOSIS — F17210 Nicotine dependence, cigarettes, uncomplicated: Secondary | ICD-10-CM | POA: Diagnosis present

## 2017-09-28 DIAGNOSIS — F1110 Opioid abuse, uncomplicated: Secondary | ICD-10-CM | POA: Diagnosis present

## 2017-09-28 DIAGNOSIS — F1210 Cannabis abuse, uncomplicated: Secondary | ICD-10-CM | POA: Diagnosis present

## 2017-09-28 DIAGNOSIS — I248 Other forms of acute ischemic heart disease: Secondary | ICD-10-CM | POA: Diagnosis present

## 2017-09-28 DIAGNOSIS — J9601 Acute respiratory failure with hypoxia: Secondary | ICD-10-CM | POA: Diagnosis present

## 2017-09-28 DIAGNOSIS — I130 Hypertensive heart and chronic kidney disease with heart failure and stage 1 through stage 4 chronic kidney disease, or unspecified chronic kidney disease: Secondary | ICD-10-CM | POA: Diagnosis present

## 2017-09-28 DIAGNOSIS — R29810 Facial weakness: Secondary | ICD-10-CM | POA: Diagnosis present

## 2017-09-28 DIAGNOSIS — N182 Chronic kidney disease, stage 2 (mild): Secondary | ICD-10-CM | POA: Diagnosis present

## 2017-09-28 DIAGNOSIS — I5022 Chronic systolic (congestive) heart failure: Secondary | ICD-10-CM | POA: Diagnosis present

## 2017-09-28 DIAGNOSIS — R778 Other specified abnormalities of plasma proteins: Secondary | ICD-10-CM | POA: Diagnosis present

## 2017-09-28 DIAGNOSIS — Z880 Allergy status to penicillin: Secondary | ICD-10-CM

## 2017-09-28 DIAGNOSIS — Z720 Tobacco use: Secondary | ICD-10-CM | POA: Diagnosis present

## 2017-09-28 DIAGNOSIS — Z8673 Personal history of transient ischemic attack (TIA), and cerebral infarction without residual deficits: Secondary | ICD-10-CM | POA: Diagnosis present

## 2017-09-28 DIAGNOSIS — I639 Cerebral infarction, unspecified: Secondary | ICD-10-CM | POA: Insufficient documentation

## 2017-09-28 DIAGNOSIS — Z7982 Long term (current) use of aspirin: Secondary | ICD-10-CM

## 2017-09-28 DIAGNOSIS — I509 Heart failure, unspecified: Secondary | ICD-10-CM

## 2017-09-28 DIAGNOSIS — R29707 NIHSS score 7: Secondary | ICD-10-CM | POA: Diagnosis present

## 2017-09-28 DIAGNOSIS — R0603 Acute respiratory distress: Secondary | ICD-10-CM

## 2017-09-28 DIAGNOSIS — Z5321 Procedure and treatment not carried out due to patient leaving prior to being seen by health care provider: Secondary | ICD-10-CM | POA: Diagnosis not present

## 2017-09-28 DIAGNOSIS — N183 Chronic kidney disease, stage 3 (moderate): Secondary | ICD-10-CM | POA: Diagnosis present

## 2017-09-28 DIAGNOSIS — R471 Dysarthria and anarthria: Secondary | ICD-10-CM | POA: Diagnosis present

## 2017-09-28 DIAGNOSIS — Z7951 Long term (current) use of inhaled steroids: Secondary | ICD-10-CM

## 2017-09-28 DIAGNOSIS — Z888 Allergy status to other drugs, medicaments and biological substances status: Secondary | ICD-10-CM

## 2017-09-28 DIAGNOSIS — J45901 Unspecified asthma with (acute) exacerbation: Secondary | ICD-10-CM | POA: Diagnosis present

## 2017-09-28 DIAGNOSIS — G9341 Metabolic encephalopathy: Secondary | ICD-10-CM | POA: Diagnosis present

## 2017-09-28 DIAGNOSIS — R531 Weakness: Secondary | ICD-10-CM

## 2017-09-28 DIAGNOSIS — I63511 Cerebral infarction due to unspecified occlusion or stenosis of right middle cerebral artery: Principal | ICD-10-CM | POA: Diagnosis present

## 2017-09-28 DIAGNOSIS — Z8249 Family history of ischemic heart disease and other diseases of the circulatory system: Secondary | ICD-10-CM

## 2017-09-28 DIAGNOSIS — R1310 Dysphagia, unspecified: Secondary | ICD-10-CM | POA: Diagnosis present

## 2017-09-28 DIAGNOSIS — R7989 Other specified abnormal findings of blood chemistry: Secondary | ICD-10-CM

## 2017-09-28 DIAGNOSIS — I428 Other cardiomyopathies: Secondary | ICD-10-CM | POA: Diagnosis present

## 2017-09-28 DIAGNOSIS — I214 Non-ST elevation (NSTEMI) myocardial infarction: Secondary | ICD-10-CM

## 2017-09-28 DIAGNOSIS — G8194 Hemiplegia, unspecified affecting left nondominant side: Secondary | ICD-10-CM | POA: Diagnosis present

## 2017-09-28 DIAGNOSIS — Z885 Allergy status to narcotic agent status: Secondary | ICD-10-CM

## 2017-09-28 LAB — COMPREHENSIVE METABOLIC PANEL
ALK PHOS: 96 U/L (ref 38–126)
ALT: 250 U/L — AB (ref 17–63)
ANION GAP: 9 (ref 5–15)
AST: 105 U/L — ABNORMAL HIGH (ref 15–41)
Albumin: 3.1 g/dL — ABNORMAL LOW (ref 3.5–5.0)
BUN: 21 mg/dL — ABNORMAL HIGH (ref 6–20)
CALCIUM: 8.7 mg/dL — AB (ref 8.9–10.3)
CO2: 26 mmol/L (ref 22–32)
CREATININE: 1.57 mg/dL — AB (ref 0.61–1.24)
Chloride: 101 mmol/L (ref 101–111)
GFR calc non Af Amer: 52 mL/min — ABNORMAL LOW (ref >60)
Glucose, Bld: 122 mg/dL — ABNORMAL HIGH (ref 65–99)
Potassium: 4.4 mmol/L (ref 3.5–5.1)
SODIUM: 136 mmol/L (ref 135–145)
Total Bilirubin: 0.6 mg/dL (ref 0.3–1.2)
Total Protein: 6.1 g/dL — ABNORMAL LOW (ref 6.5–8.1)

## 2017-09-28 LAB — URINALYSIS, ROUTINE W REFLEX MICROSCOPIC
BACTERIA UA: NONE SEEN
Bilirubin Urine: NEGATIVE
GLUCOSE, UA: NEGATIVE mg/dL
HGB URINE DIPSTICK: NEGATIVE
Ketones, ur: NEGATIVE mg/dL
LEUKOCYTES UA: NEGATIVE
NITRITE: NEGATIVE
PROTEIN: 100 mg/dL — AB
SPECIFIC GRAVITY, URINE: 1.019 (ref 1.005–1.030)
pH: 6 (ref 5.0–8.0)

## 2017-09-28 LAB — I-STAT CHEM 8, ED
BUN: 24 mg/dL — AB (ref 6–20)
CALCIUM ION: 1.17 mmol/L (ref 1.15–1.40)
CHLORIDE: 99 mmol/L — AB (ref 101–111)
CREATININE: 1.5 mg/dL — AB (ref 0.61–1.24)
Glucose, Bld: 118 mg/dL — ABNORMAL HIGH (ref 65–99)
HCT: 42 % (ref 39.0–52.0)
Hemoglobin: 14.3 g/dL (ref 13.0–17.0)
Potassium: 4.4 mmol/L (ref 3.5–5.1)
Sodium: 137 mmol/L (ref 135–145)
TCO2: 29 mmol/L (ref 22–32)

## 2017-09-28 LAB — RAPID URINE DRUG SCREEN, HOSP PERFORMED
AMPHETAMINES: NOT DETECTED
BENZODIAZEPINES: NOT DETECTED
Barbiturates: NOT DETECTED
Cocaine: POSITIVE — AB
Opiates: NOT DETECTED
TETRAHYDROCANNABINOL: POSITIVE — AB

## 2017-09-28 LAB — CBC WITH DIFFERENTIAL/PLATELET
BASOS PCT: 0 %
Basophils Absolute: 0 10*3/uL (ref 0.0–0.1)
EOS ABS: 0 10*3/uL (ref 0.0–0.7)
EOS PCT: 0 %
HCT: 36.7 % — ABNORMAL LOW (ref 39.0–52.0)
HEMOGLOBIN: 12.2 g/dL — AB (ref 13.0–17.0)
Lymphocytes Relative: 30 %
Lymphs Abs: 2.7 10*3/uL (ref 0.7–4.0)
MCH: 26.1 pg (ref 26.0–34.0)
MCHC: 33.2 g/dL (ref 30.0–36.0)
MCV: 78.6 fL (ref 78.0–100.0)
MONO ABS: 0.8 10*3/uL (ref 0.1–1.0)
Monocytes Relative: 9 %
NEUTROS ABS: 5.5 10*3/uL (ref 1.7–7.7)
Neutrophils Relative %: 61 %
Platelets: 176 10*3/uL (ref 150–400)
RBC: 4.67 MIL/uL (ref 4.22–5.81)
RDW: 14.7 % (ref 11.5–15.5)
WBC: 9 10*3/uL (ref 4.0–10.5)

## 2017-09-28 LAB — I-STAT VENOUS BLOOD GAS, ED
ACID-BASE EXCESS: 2 mmol/L (ref 0.0–2.0)
BICARBONATE: 29 mmol/L — AB (ref 20.0–28.0)
O2 Saturation: 92 %
PH VEN: 7.359 (ref 7.250–7.430)
TCO2: 31 mmol/L (ref 22–32)
pCO2, Ven: 51.5 mmHg (ref 44.0–60.0)
pO2, Ven: 69 mmHg — ABNORMAL HIGH (ref 32.0–45.0)

## 2017-09-28 LAB — ECHOCARDIOGRAM COMPLETE

## 2017-09-28 LAB — LIPID PANEL
Cholesterol: 113 mg/dL (ref 0–200)
HDL: 30 mg/dL — ABNORMAL LOW (ref >40)
LDL CALC: 68 mg/dL (ref 0–99)
TRIGLYCERIDES: 76 mg/dL (ref <150)
Total CHOL/HDL Ratio: 3.8 RATIO
VLDL: 15 mg/dL (ref 0–40)

## 2017-09-28 LAB — HEMOGLOBIN A1C
HEMOGLOBIN A1C: 6.4 % — AB (ref 4.8–5.6)
Mean Plasma Glucose: 136.98 mg/dL

## 2017-09-28 LAB — I-STAT TROPONIN, ED: TROPONIN I, POC: 0.36 ng/mL — AB (ref 0.00–0.08)

## 2017-09-28 LAB — ETHANOL

## 2017-09-28 LAB — TROPONIN I
TROPONIN I: 0.24 ng/mL — AB (ref <0.03)
Troponin I: 0.34 ng/mL (ref <0.03)

## 2017-09-28 LAB — BRAIN NATRIURETIC PEPTIDE: B NATRIURETIC PEPTIDE 5: 1504.5 pg/mL — AB (ref 0.0–100.0)

## 2017-09-28 MED ORDER — STROKE: EARLY STAGES OF RECOVERY BOOK
Freq: Once | Status: DC
  Filled 2017-09-28: qty 1

## 2017-09-28 MED ORDER — ALBUTEROL (5 MG/ML) CONTINUOUS INHALATION SOLN
10.0000 mg/h | INHALATION_SOLUTION | RESPIRATORY_TRACT | Status: DC
  Administered 2017-09-28: 10 mg/h via RESPIRATORY_TRACT
  Filled 2017-09-28: qty 20

## 2017-09-28 MED ORDER — NALOXONE HCL 0.4 MG/ML IJ SOLN
0.4000 mg | Freq: Once | INTRAMUSCULAR | Status: AC
  Administered 2017-09-28: 0.4 mg via INTRAVENOUS
  Filled 2017-09-28: qty 1

## 2017-09-28 MED ORDER — BENZONATATE 100 MG PO CAPS: 100.0000 mg | ORAL_CAPSULE | Freq: Three times a day (TID) | ORAL | Status: DC | PRN

## 2017-09-28 MED ORDER — GUAIFENESIN-DM 100-10 MG/5ML PO SYRP: 5.0000 mL | ORAL_SOLUTION | ORAL | Status: DC | PRN

## 2017-09-28 MED ORDER — ATORVASTATIN CALCIUM 80 MG PO TABS: 80.0000 mg | ORAL_TABLET | Freq: Every day | ORAL | Status: DC

## 2017-09-28 MED ORDER — ATORVASTATIN CALCIUM 10 MG PO TABS
20.0000 mg | ORAL_TABLET | Freq: Every day | ORAL | Status: DC
  Administered 2017-09-29 – 2017-09-30 (×2): 20 mg via ORAL
  Filled 2017-09-28: qty 2
  Filled 2017-09-28: qty 1

## 2017-09-28 MED ORDER — METHYLPREDNISOLONE SODIUM SUCC 125 MG IJ SOLR
125.0000 mg | Freq: Once | INTRAMUSCULAR | Status: AC
  Administered 2017-09-28: 125 mg via INTRAVENOUS
  Filled 2017-09-28: qty 2

## 2017-09-28 MED ORDER — IPRATROPIUM-ALBUTEROL 0.5-2.5 (3) MG/3ML IN SOLN
3.0000 mL | RESPIRATORY_TRACT | Status: DC | PRN
  Administered 2017-09-29 – 2017-10-01 (×4): 3 mL via RESPIRATORY_TRACT
  Filled 2017-09-28 (×4): qty 3

## 2017-09-28 MED ORDER — SODIUM CHLORIDE 0.9 % IV SOLN: INTRAVENOUS | Status: DC

## 2017-09-28 MED ORDER — ACETAMINOPHEN 650 MG RE SUPP: 650.0000 mg | RECTAL | Status: DC | PRN

## 2017-09-28 MED ORDER — BECLOMETHASONE DIPROPIONATE 80 MCG/ACT IN AERS: 1.0000 | INHALATION_SPRAY | Freq: Two times a day (BID) | RESPIRATORY_TRACT | Status: DC

## 2017-09-28 MED ORDER — IPRATROPIUM-ALBUTEROL 0.5-2.5 (3) MG/3ML IN SOLN
3.0000 mL | Freq: Four times a day (QID) | RESPIRATORY_TRACT | Status: DC
  Administered 2017-09-28: 3 mL via RESPIRATORY_TRACT
  Filled 2017-09-28: qty 3

## 2017-09-28 MED ORDER — RESOURCE THICKENUP CLEAR PO POWD
ORAL | Status: DC | PRN
  Filled 2017-09-28: qty 125

## 2017-09-28 MED ORDER — IOPAMIDOL (ISOVUE-370) INJECTION 76%
INTRAVENOUS | Status: AC
  Administered 2017-09-28: 100 mL
  Filled 2017-09-28: qty 100

## 2017-09-28 MED ORDER — ASPIRIN 325 MG PO TABS
325.0000 mg | ORAL_TABLET | Freq: Every day | ORAL | Status: DC
  Administered 2017-09-30 – 2017-10-01 (×2): 325 mg via ORAL
  Filled 2017-09-28 (×3): qty 1

## 2017-09-28 MED ORDER — SENNOSIDES-DOCUSATE SODIUM 8.6-50 MG PO TABS: 1.0000 | ORAL_TABLET | Freq: Every evening | ORAL | Status: DC | PRN

## 2017-09-28 MED ORDER — ACETAMINOPHEN 325 MG PO TABS: 650.0000 mg | ORAL_TABLET | ORAL | Status: DC | PRN

## 2017-09-28 MED ORDER — ACETAMINOPHEN 160 MG/5ML PO SOLN: 650.0000 mg | ORAL | Status: DC | PRN

## 2017-09-28 MED ORDER — BUDESONIDE 0.25 MG/2ML IN SUSP
0.2500 mg | Freq: Two times a day (BID) | RESPIRATORY_TRACT | Status: DC
  Administered 2017-09-28 – 2017-10-01 (×7): 0.25 mg via RESPIRATORY_TRACT
  Filled 2017-09-28 (×9): qty 2

## 2017-09-28 MED ORDER — ENOXAPARIN SODIUM 40 MG/0.4ML ~~LOC~~ SOLN
40.0000 mg | SUBCUTANEOUS | Status: DC
  Administered 2017-09-28 – 2017-10-01 (×4): 40 mg via SUBCUTANEOUS
  Filled 2017-09-28 (×4): qty 0.4

## 2017-09-28 MED ORDER — ASPIRIN 300 MG RE SUPP: 300.0000 mg | Freq: Every day | RECTAL | Status: DC

## 2017-09-28 MED ORDER — FUROSEMIDE 40 MG PO TABS
40.0000 mg | ORAL_TABLET | Freq: Every day | ORAL | Status: DC
  Administered 2017-09-30 – 2017-10-01 (×2): 40 mg via ORAL
  Filled 2017-09-28 (×3): qty 1

## 2017-09-28 MED ORDER — ASPIRIN 81 MG PO TBEC: 81.0000 mg | DELAYED_RELEASE_TABLET | Freq: Every day | ORAL | Status: DC

## 2017-09-28 NOTE — ED Notes (Signed)
Dysphasia 2 nectar liquids dinner tray ordered

## 2017-09-28 NOTE — ED Provider Notes (Signed)
[name] EMERGENCY DEPARTMENT Provider Note   CSN: [id number] Arrival date & time: 09/28/17  0154     History   Chief Complaint Chief Complaint  Patient presents with  . Fall  . Shortness of Breath    HPI Mark [name] is a 42 y.o. male.  The history is provided by the patient. No language interpreter was used.  Fall  Associated symptoms include shortness of breath.  Shortness of Breath    Mark [name] is a 42 y.o. male who presents to the Emergency Department complaining of fall, SOB.  Level 5 caveat due to respiratory distress.  History is provided by the patient and EMS.  Per EMS reports he was last seen normal at 530 tonight and he had 2 unwitnessed syncopal events and EMS was called for difficulty breathing.  He received 2 nebulizer treatments prior to ED arrival.  They state that when they arrived he had left facial droop as well as left-sided weakness.  They report that at home he had transient dysarthria.  Patient states that he has chronic left-sided weakness and states that he fell because he feels weak all over.  He feels increased shortness of breath since running out of albuterol a few days ago.  He denies any chest pain, neck pain, headache.  He denies any drug use.  He does endorse drinking 2 shots of liquor today. Past Medical History:  Diagnosis Date  . Asthma   . Chronic systolic CHF (congestive heart failure) (HCC)   . Cigarette smoker   . History of echocardiogram    a. Echo 5/17 - EF 20-25%, severe diff HK, restrictive physiology, mild to mod MR, severe reduced RVSF, mod RVE, mild RAE, mod TR, PASP 48 mmHg  . Hx of cardiac cath    a. LHC 5/17 - normal coronary arteries. PA 45/25, mean 33, PCWP mean 18  . NICM (nonischemic cardiomyopathy) (HCC)   . Substance abuse (HCC)    cocaine, marijuana    Patient Active Problem List   Diagnosis Date Noted  . Acute CVA (cerebrovascular accident) (HCC) 09/28/2017  . Stroke (HCC) 09/28/2017    . CHF (congestive heart failure) (HCC) 02/07/2016  . Acute on chronic systolic CHF (congestive heart failure) (HCC) 01/11/2016  . NICM (nonischemic cardiomyopathy) (HCC) 01/11/2016  . CKD (chronic kidney disease) stage 2, GFR 60-89 ml/min 01/11/2016  . Cocaine abuse (HCC) 01/11/2016  . Chest pain, pleuritic 01/03/2016  . Tobacco abuse 01/03/2016  . Asthma 01/03/2016  . Leg swelling 01/03/2016  . Tachycardia 01/03/2016  . Normocytic anemia 01/03/2016  . Elevated troponin 01/03/2016  . Right rib fracture 01/03/2016  . Chest pain     Past Surgical History:  Procedure Laterality Date  . CARDIAC CATHETERIZATION N/A 01/05/2016   Procedure: Right/Left Heart Cath and Coronary Angiography;  Surgeon: Lennette Bihari, MD;  Location: [facility] INVASIVE CV LAB;  Service: Cardiovascular;  Laterality: N/A;  . none         Home Medications    Prior to Admission medications   Medication Sig Start Date End Date Taking? Authorizing Provider  albuterol (PROVENTIL HFA;VENTOLIN HFA) 108 (90 Base) MCG/ACT inhaler Inhale 1-2 puffs into the lungs every 6 (six) hours as needed for wheezing or shortness of breath. 09/22/17  Yes Rai, Ripudeep K, MD  aspirin 81 MG EC tablet Take 1 tablet (81 mg total) by mouth daily. 09/22/17  Yes Rai, Ripudeep K, MD  beclomethasone (QVAR) 80 MCG/ACT inhaler Inhale 1 puff into  the lungs 2 (two) times daily. 09/22/17  Yes Rai, Ripudeep K, MD  benzonatate (TESSALON) 100 MG capsule Take 1 capsule (100 mg total) by mouth 3 (three) times daily as needed for cough. [address] 09/22/17  Yes Rai, Ripudeep K, MD  carvedilol (COREG) 3.125 MG tablet Take 1 tablet (3.125 mg total) by mouth 2 (two) times daily with a meal. 09/22/17  Yes Rai, Ripudeep K, MD  furosemide (LASIX) 40 MG tablet Take 1 tablet (40 mg total) by mouth daily. 09/22/17  Yes Rai, Ripudeep K, MD  guaiFENesin-dextromethorphan (ROBITUSSIN DM) 100-10 MG/5ML syrup Take 5 mLs by mouth every 4 (four) hours as needed for cough. 09/22/17  Yes Rai,  Ripudeep K, MD  isosorbide-hydrALAZINE (BIDIL) 20-37.5 MG tablet Take 1 tablet by mouth 3 (three) times daily. 09/22/17  Yes Rai, Delene Ruffini, MD    Family History Family History  Problem Relation Age of Onset  . Cardiomyopathy Father        Reports his father has an LVAD  . Heart failure Father   . Hypertension Father   . Deep vein thrombosis Neg Hx     Social History Social History   Tobacco Use  . Smoking status: Current Every Day Smoker    Packs/day: 0.50    Years: 20.00    Pack years: 10.00    Types: Cigarettes  . Smokeless tobacco: Never Used  Substance Use Topics  . Alcohol use: Yes  . Drug use: Yes    Types: Cocaine, Marijuana     Allergies   Hydrocodone; Lisinopril; Prednisone; and Penicillins   Review of Systems Review of Systems  Respiratory: Positive for shortness of breath.   All other systems reviewed and are negative.    Physical Exam Updated Vital Signs BP 120/87   Pulse (!) 50   Temp 98.2 F (36.8 C)   Resp 17   SpO2 95%   Physical Exam  Constitutional: He appears well-developed and well-nourished. He appears ill. He appears distressed.  HENT:  Head: Normocephalic and atraumatic.  Cardiovascular: Normal rate and regular rhythm.  No murmur heard. Pulmonary/Chest: He is in respiratory distress.  Decreased air movement bilaterally.  Tachypnea with accessory muscle use.  Abdominal: Soft. There is no tenderness. There is no rebound and no guarding.  Musculoskeletal: He exhibits no edema or tenderness.  Neurological:  Mildly confused.  Fluent speech. Left hemiparesis  Skin: Skin is warm and dry.  Psychiatric:  Unable to assess  Nursing note and vitals reviewed.    ED Treatments / Results  Labs (all labs ordered are listed, but only abnormal results are displayed) Labs Reviewed  CBC WITH DIFFERENTIAL/PLATELET - Abnormal; Notable for the following components:      Result Value   Hemoglobin 12.2 (*)    HCT 36.7 (*)    All other  components within normal limits  COMPREHENSIVE METABOLIC PANEL - Abnormal; Notable for the following components:   Glucose, Bld 122 (*)    BUN 21 (*)    Creatinine, Ser 1.57 (*)    Calcium 8.7 (*)    Total Protein 6.1 (*)    Albumin 3.1 (*)    AST 105 (*)    ALT 250 (*)    GFR calc non Af Amer 52 (*)    All other components within normal limits  BRAIN NATRIURETIC PEPTIDE - Abnormal; Notable for the following components:   B Natriuretic Peptide 1,504.5 (*)    All other components within normal limits  URINALYSIS, ROUTINE W  REFLEX MICROSCOPIC - Abnormal; Notable for the following components:   Protein, ur 100 (*)    Squamous Epithelial / LPF 0-5 (*)    All other components within normal limits  RAPID URINE DRUG SCREEN, HOSP PERFORMED - Abnormal; Notable for the following components:   Cocaine POSITIVE (*)    Tetrahydrocannabinol POSITIVE (*)    All other components within normal limits  I-STAT TROPONIN, ED - Abnormal; Notable for the following components:   Troponin i, poc 0.36 (*)    All other components within normal limits  I-STAT CHEM 8, ED - Abnormal; Notable for the following components:   Chloride 99 (*)    BUN 24 (*)    Creatinine, Ser 1.50 (*)    Glucose, Bld 118 (*)    All other components within normal limits  I-STAT VENOUS BLOOD GAS, ED - Abnormal; Notable for the following components:   pO2, Ven 69.0 (*)    Bicarbonate 29.0 (*)    All other components within normal limits  ETHANOL  HEMOGLOBIN A1C  LIPID PANEL  TROPONIN I  TROPONIN I  TROPONIN I    EKG  EKG Interpretation None       Radiology Ct Angio Head W Or Wo Contrast  Result Date: 09/28/2017 CLINICAL DATA:  Initial evaluation for acute left-sided weakness, falls. EXAM: CT ANGIOGRAPHY HEAD AND NECK CT PERFUSION BRAIN TECHNIQUE: Multidetector CT imaging of the head and neck was performed using the standard protocol during bolus administration of intravenous contrast. Multiplanar CT image  reconstructions and MIPs were obtained to evaluate the vascular anatomy. Carotid stenosis measurements (when applicable) are obtained utilizing NASCET criteria, using the distal internal carotid diameter as the denominator. Multiphase CT imaging of the brain was performed following IV bolus contrast injection. Subsequent parametric perfusion maps were calculated using RAPID software. CONTRAST:  ISOVUE-370 IOPAMIDOL (ISOVUE-370) INJECTION 76% COMPARISON:  None. FINDINGS: CT HEAD FINDINGS Brain: Examination mildly limited by patient positioning. No acute intracranial hemorrhage. Abnormal hypodensity seen involving the right insular cortex, concerning for evolving right MCA territory infarct (series 3, image 19). Possible extension into the adjacent into cortex lateral to the insular ribbon. Basal ganglia and internal capsule are spared at this time. No other acute large vessel territory infarct. No mass lesion or midline shift. No hydrocephalus. No extra-axial fluid collection. Vascular: Abnormal hyperdensity within distal right M1 and/or M2 branch at the base of the sylvian fissure (series 3, image 17), concerning for thrombus. Skull: Scalp soft tissues and calvarium within normal limits. 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 Mild right sphenoid sinus opacification. Paranasal sinuses are otherwise clear. Mastoids are clear. No acute abnormality about the globes or orbits. Defect noted at the posterior left globe. Other: None. ASPECTS (Alberta Stroke Program Early CT Score) - Ganglionic level infarction (caudate, lentiform nuclei, internal capsule, insula, M1-M3 cortex): 5 - Supraganglionic infarction (M4-M6 cortex): 3 Total score (0-10 with 10 being normal): 8 Review of the MIP images confirms the above findings CTA NECK FINDINGS Aortic arch: Visualized aortic arch of normal caliber with normal branch pattern. No flow-limiting stenosis about the origin  of the great vessels. Visualized subclavian arteries patent. Right carotid system: Right common and internal carotid arteries are widely patent without stenosis, dissection, or occlusion. No significant atheromatous narrowing about the right carotid bifurcation. Left carotid system: Left common and internal carotid arteries are widely patent without stenosis, dissection, or occlusion. No significant atheromatous narrowing about the left carotid bifurcation. Vertebral arteries: Left vertebral  artery diffusely hypoplastic and arises from the aortic arch. Vertebral arteries widely patent within the neck without stenosis, dissection, or occlusion. Skeleton: No acute osseous abnormality. No worrisome lytic or blastic osseous lesions. Other neck: No acute soft tissue abnormality within the neck. No adenopathy. Salivary glands within normal limits. Thyroid within normal limits. Upper chest: Visualized upper chest demonstrates no acute abnormality. Emphysema. Visualized lungs are otherwise clear. Review of the MIP images confirms the above findings CTA HEAD FINDINGS Anterior circulation: Petrous segments widely patent. Cavernous and supraclinoid segments patent without high-grade stenosis. ICA termini patent. A1 segments patent. Anterior communicating artery patent and normal. Anterior cerebral arteries patent to their distal aspects without high-grade stenosis. Left M1 segment patent without stenosis. No proximal left M2 occlusion. Distal left MCA branches well perfused. Right M1 segment patent to its distal aspect. There is abrupt occlusion of the distal right M1 segment at the base of the sylvian fissure (series 13, image 99). This appears to be subocclusive with flow seen distally (series 13, image 94). No other proximal or large vessel occlusion. Asymmetric prominence and flow within the distal right MCA branches with left meningeal enhancement as compared to the left cerebral hemisphere, suspected to be related to  collateral flow. Right MCA branches otherwise perfused. Posterior circulation: Right vertebral artery patent to the vertebrobasilar junction without high-grade stenosis. Hypoplastic left vertebral artery largely terminates in PICA. Basilar artery diminutive but patent to its distal aspect. Superior cerebral arteries patent bilaterally. Fetal type origin of the PCAs bilaterally. PCAs irregular but patent to their distal aspects. There is diffuse irregularity throughout the intracranial circulation. While this finding may in part be atherosclerotic in nature, the diffuse nature suggest possible vasculopathy. Venous sinuses: Patent. Anatomic variants: Fetal type origin of the PCAs with diminutive vertebrobasilar system. No aneurysm. Delayed phase: Not performed. Review of the MIP images confirms the above findings CT Brain Perfusion Findings: CBF (<30%) Volume: 6mL Perfusion (Tmax>6.0s) volume: 10mL Mismatch Volume: 19mL Infarction Location:No core infarct by CT perfusion. Ischemic penumbra throughout the mid and posterior right MCA territory, primarily involving the right frontotemporal region. IMPRESSION: 1. Positive CTA for emergent large vessel occlusion involving distal right M1/proximal M2 segment. Flow seen distal to the site of occlusion, suggesting either subocclusive thrombus or possibly recanalization. Superimposed vasospasm may be contributory as well. Aspects equals 8. 2. Ischemic penumbra throughout the mid and posterior right MCA territory, primarily involving the right frontotemporal region. No core infarct identified by perfusion imaging. 3. Asymmetric leptomeningeal enhancement within the right MCA territory, likely related to collateralization. 4. Diffuse irregularity throughout the intracranial arterial circulation. While this finding may in part be atherosclerotic in nature, an underlying vasculopathy could also be considered. 5. Emphysema. Critical Value/emergent results were called by telephone  at the time of interpretation on 09/28/2017 at 3:37 am to Dr. Milon Dikes , who verbally acknowledged these results. Electronically Signed   By: Rise Mu M.D.   On: 09/28/2017 04:13   Ct Head Wo Contrast  Result Date: 09/28/2017 CLINICAL DATA:  Initial evaluation for acute left-sided weakness, falls. EXAM: CT ANGIOGRAPHY HEAD AND NECK CT PERFUSION BRAIN TECHNIQUE: Multidetector CT imaging of the head and neck was performed using the standard protocol during bolus administration of intravenous contrast. Multiplanar CT image reconstructions and MIPs were obtained to evaluate the vascular anatomy. Carotid stenosis measurements (when applicable) are obtained utilizing NASCET criteria, using the distal internal carotid diameter as the denominator. Multiphase CT imaging of the brain was performed following IV bolus contrast  injection. Subsequent parametric perfusion maps were calculated using RAPID software. CONTRAST:  ISOVUE-370 IOPAMIDOL (ISOVUE-370) INJECTION 76% COMPARISON:  None. FINDINGS: CT HEAD FINDINGS Brain: Examination mildly limited by patient positioning. No acute intracranial hemorrhage. Abnormal hypodensity seen involving the right insular cortex, concerning for evolving right MCA territory infarct (series 3, image 19). Possible extension into the adjacent into cortex lateral to the insular ribbon. Basal ganglia and internal capsule are spared at this time. No other acute large vessel territory infarct. No mass lesion or midline shift. No hydrocephalus. No extra-axial fluid collection. Vascular: Abnormal hyperdensity within distal right M1 and/or M2 branch at the base of the sylvian fissure (series 3, image 17), concerning for thrombus. Skull: Scalp soft tissues and calvarium within normal limits. 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 Mild right sphenoid sinus opacification. Paranasal sinuses are otherwise clear. Mastoids are  clear. No acute abnormality about the globes or orbits. Defect noted at the posterior left globe. Other: None. ASPECTS (Alberta Stroke Program Early CT Score) - Ganglionic level infarction (caudate, lentiform nuclei, internal capsule, insula, M1-M3 cortex): 5 - Supraganglionic infarction (M4-M6 cortex): 3 Total score (0-10 with 10 being normal): 8 Review of the MIP images confirms the above findings CTA NECK FINDINGS Aortic arch: Visualized aortic arch of normal caliber with normal branch pattern. No flow-limiting stenosis about the origin of the great vessels. Visualized subclavian arteries patent. Right carotid system: Right common and internal carotid arteries are widely patent without stenosis, dissection, or occlusion. No significant atheromatous narrowing about the right carotid bifurcation. Left carotid system: Left common and internal carotid arteries are widely patent without stenosis, dissection, or occlusion. No significant atheromatous narrowing about the left carotid bifurcation. Vertebral arteries: Left vertebral artery diffusely hypoplastic and arises from the aortic arch. Vertebral arteries widely patent within the neck without stenosis, dissection, or occlusion. Skeleton: No acute osseous abnormality. No worrisome lytic or blastic osseous lesions. Other neck: No acute soft tissue abnormality within the neck. No adenopathy. Salivary glands within normal limits. Thyroid within normal limits. Upper chest: Visualized upper chest demonstrates no acute abnormality. Emphysema. Visualized lungs are otherwise clear. Review of the MIP images confirms the above findings CTA HEAD FINDINGS Anterior circulation: Petrous segments widely patent. Cavernous and supraclinoid segments patent without high-grade stenosis. ICA termini patent. A1 segments patent. Anterior communicating artery patent and normal. Anterior cerebral arteries patent to their distal aspects without high-grade stenosis. Left M1 segment patent  without stenosis. No proximal left M2 occlusion. Distal left MCA branches well perfused. Right M1 segment patent to its distal aspect. There is abrupt occlusion of the distal right M1 segment at the base of the sylvian fissure (series 13, image 99). This appears to be subocclusive with flow seen distally (series 13, image 94). No other proximal or large vessel occlusion. Asymmetric prominence and flow within the distal right MCA branches with left meningeal enhancement as compared to the left cerebral hemisphere, suspected to be related to collateral flow. Right MCA branches otherwise perfused. Posterior circulation: Right vertebral artery patent to the vertebrobasilar junction without high-grade stenosis. Hypoplastic left vertebral artery largely terminates in PICA. Basilar artery diminutive but patent to its distal aspect. Superior cerebral arteries patent bilaterally. Fetal type origin of the PCAs bilaterally. PCAs irregular but patent to their distal aspects. There is diffuse irregularity throughout the intracranial circulation. While this finding may in part be atherosclerotic in nature, the diffuse nature suggest possible vasculopathy. Venous sinuses: Patent. Anatomic variants: Fetal type origin of the  PCAs with diminutive vertebrobasilar system. No aneurysm. Delayed phase: Not performed. Review of the MIP images confirms the above findings CT Brain Perfusion Findings: CBF (<30%) Volume: 0mL Perfusion (Tmax>6.0s) volume: 89mL Mismatch Volume: 89mL Infarction Location:No core infarct by CT perfusion. Ischemic penumbra throughout the mid and posterior right MCA territory, primarily involving the right frontotemporal region. IMPRESSION: 1. Positive CTA for emergent large vessel occlusion involving distal right M1/proximal M2 segment. Flow seen distal to the site of occlusion, suggesting either subocclusive thrombus or possibly recanalization. Superimposed vasospasm may be contributory as well. Aspects equals 8.  2. Ischemic penumbra throughout the mid and posterior right MCA territory, primarily involving the right frontotemporal region. No core infarct identified by perfusion imaging. 3. Asymmetric leptomeningeal enhancement within the right MCA territory, likely related to collateralization. 4. Diffuse irregularity throughout the intracranial arterial circulation. While this finding may in part be atherosclerotic in nature, an underlying vasculopathy could also be considered. 5. Emphysema. Critical Value/emergent results were called by telephone at the time of interpretation on 09/28/2017 at 3:37 am to Dr. Milon Dikes , who verbally acknowledged these results. Electronically Signed   By: Rise Mu M.D.   On: 09/28/2017 04:13   Ct Angio Neck W And/or Wo Contrast  Result Date: 09/28/2017 CLINICAL DATA:  Initial evaluation for acute left-sided weakness, falls. EXAM: CT ANGIOGRAPHY HEAD AND NECK CT PERFUSION BRAIN TECHNIQUE: Multidetector CT imaging of the head and neck was performed using the standard protocol during bolus administration of intravenous contrast. Multiplanar CT image reconstructions and MIPs were obtained to evaluate the vascular anatomy. Carotid stenosis measurements (when applicable) are obtained utilizing NASCET criteria, using the distal internal carotid diameter as the denominator. Multiphase CT imaging of the brain was performed following IV bolus contrast injection. Subsequent parametric perfusion maps were calculated using RAPID software. CONTRAST:  ISOVUE-370 IOPAMIDOL (ISOVUE-370) INJECTION 76% COMPARISON:  None. FINDINGS: CT HEAD FINDINGS Brain: Examination mildly limited by patient positioning. No acute intracranial hemorrhage. Abnormal hypodensity seen involving the right insular cortex, concerning for evolving right MCA territory infarct (series 3, image 19). Possible extension into the adjacent into cortex lateral to the insular ribbon. Basal ganglia and internal capsule are  spared at this time. No other acute large vessel territory infarct. No mass lesion or midline shift. No hydrocephalus. No extra-axial fluid collection. Vascular: Abnormal hyperdensity within distal right M1 and/or M2 branch at the base of the sylvian fissure (series 3, image 17), concerning for thrombus. Skull: Scalp soft tissues and calvarium within normal limits. 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 Mild right sphenoid sinus opacification. Paranasal sinuses are otherwise clear. Mastoids are clear. No acute abnormality about the globes or orbits. Defect noted at the posterior left globe. Other: None. ASPECTS (Alberta Stroke Program Early CT Score) - Ganglionic level infarction (caudate, lentiform nuclei, internal capsule, insula, M1-M3 cortex): 5 - Supraganglionic infarction (M4-M6 cortex): 3 Total score (0-10 with 10 being normal): 8 Review of the MIP images confirms the above findings CTA NECK FINDINGS Aortic arch: Visualized aortic arch of normal caliber with normal branch pattern. No flow-limiting stenosis about the origin of the great vessels. Visualized subclavian arteries patent. Right carotid system: Right common and internal carotid arteries are widely patent without stenosis, dissection, or occlusion. No significant atheromatous narrowing about the right carotid bifurcation. Left carotid system: Left common and internal carotid arteries are widely patent without stenosis, dissection, or occlusion. No significant atheromatous narrowing about the left carotid bifurcation. Vertebral arteries: Left vertebral artery  diffusely hypoplastic and arises from the aortic arch. Vertebral arteries widely patent within the neck without stenosis, dissection, or occlusion. Skeleton: No acute osseous abnormality. No worrisome lytic or blastic osseous lesions. Other neck: No acute soft tissue abnormality within the neck. No adenopathy. Salivary glands within normal  limits. Thyroid within normal limits. Upper chest: Visualized upper chest demonstrates no acute abnormality. Emphysema. Visualized lungs are otherwise clear. Review of the MIP images confirms the above findings CTA HEAD FINDINGS Anterior circulation: Petrous segments widely patent. Cavernous and supraclinoid segments patent without high-grade stenosis. ICA termini patent. A1 segments patent. Anterior communicating artery patent and normal. Anterior cerebral arteries patent to their distal aspects without high-grade stenosis. Left M1 segment patent without stenosis. No proximal left M2 occlusion. Distal left MCA branches well perfused. Right M1 segment patent to its distal aspect. There is abrupt occlusion of the distal right M1 segment at the base of the sylvian fissure (series 13, image 99). This appears to be subocclusive with flow seen distally (series 13, image 94). No other proximal or large vessel occlusion. Asymmetric prominence and flow within the distal right MCA branches with left meningeal enhancement as compared to the left cerebral hemisphere, suspected to be related to collateral flow. Right MCA branches otherwise perfused. Posterior circulation: Right vertebral artery patent to the vertebrobasilar junction without high-grade stenosis. Hypoplastic left vertebral artery largely terminates in PICA. Basilar artery diminutive but patent to its distal aspect. Superior cerebral arteries patent bilaterally. Fetal type origin of the PCAs bilaterally. PCAs irregular but patent to their distal aspects. There is diffuse irregularity throughout the intracranial circulation. While this finding may in part be atherosclerotic in nature, the diffuse nature suggest possible vasculopathy. Venous sinuses: Patent. Anatomic variants: Fetal type origin of the PCAs with diminutive vertebrobasilar system. No aneurysm. Delayed phase: Not performed. Review of the MIP images confirms the above findings CT Brain Perfusion  Findings: CBF (<30%) Volume: 0mL Perfusion (Tmax>6.0s) volume: 89mL Mismatch Volume: 89mL Infarction Location:No core infarct by CT perfusion. Ischemic penumbra throughout the mid and posterior right MCA territory, primarily involving the right frontotemporal region. IMPRESSION: 1. Positive CTA for emergent large vessel occlusion involving distal right M1/proximal M2 segment. Flow seen distal to the site of occlusion, suggesting either subocclusive thrombus or possibly recanalization. Superimposed vasospasm may be contributory as well. Aspects equals 8. 2. Ischemic penumbra throughout the mid and posterior right MCA territory, primarily involving the right frontotemporal region. No core infarct identified by perfusion imaging. 3. Asymmetric leptomeningeal enhancement within the right MCA territory, likely related to collateralization. 4. Diffuse irregularity throughout the intracranial arterial circulation. While this finding may in part be atherosclerotic in nature, an underlying vasculopathy could also be considered. 5. Emphysema. Critical Value/emergent results were called by telephone at the time of interpretation on 09/28/2017 at 3:37 am to Dr. Milon Dikes , who verbally acknowledged these results. Electronically Signed   By: Rise Mu M.D.   On: 09/28/2017 04:13   Ct Cerebral Perfusion W Contrast  Result Date: 09/28/2017 CLINICAL DATA:  Initial evaluation for acute left-sided weakness, falls. EXAM: CT ANGIOGRAPHY HEAD AND NECK CT PERFUSION BRAIN TECHNIQUE: Multidetector CT imaging of the head and neck was performed using the standard protocol during bolus administration of intravenous contrast. Multiplanar CT image reconstructions and MIPs were obtained to evaluate the vascular anatomy. Carotid stenosis measurements (when applicable) are obtained utilizing NASCET criteria, using the distal internal carotid diameter as the denominator. Multiphase CT imaging of the brain was performed following IV  bolus  contrast injection. Subsequent parametric perfusion maps were calculated using RAPID software. CONTRAST:  ISOVUE-370 IOPAMIDOL (ISOVUE-370) INJECTION 76% COMPARISON:  None. FINDINGS: CT HEAD FINDINGS Brain: Examination mildly limited by patient positioning. No acute intracranial hemorrhage. Abnormal hypodensity seen involving the right insular cortex, concerning for evolving right MCA territory infarct (series 3, image 19). Possible extension into the adjacent into cortex lateral to the insular ribbon. Basal ganglia and internal capsule are spared at this time. No other acute large vessel territory infarct. No mass lesion or midline shift. No hydrocephalus. No extra-axial fluid collection. Vascular: Abnormal hyperdensity within distal right M1 and/or M2 branch at the base of the sylvian fissure (series 3, image 17), concerning for thrombus. Skull: Scalp soft tissues and calvarium within normal limits. 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 Mild right sphenoid sinus opacification. Paranasal sinuses are otherwise clear. Mastoids are clear. No acute abnormality about the globes or orbits. Defect noted at the posterior left globe. Other: None. ASPECTS (Alberta Stroke Program Early CT Score) - Ganglionic level infarction (caudate, lentiform nuclei, internal capsule, insula, M1-M3 cortex): 5 - Supraganglionic infarction (M4-M6 cortex): 3 Total score (0-10 with 10 being normal): 8 Review of the MIP images confirms the above findings CTA NECK FINDINGS Aortic arch: Visualized aortic arch of normal caliber with normal branch pattern. No flow-limiting stenosis about the origin of the great vessels. Visualized subclavian arteries patent. Right carotid system: Right common and internal carotid arteries are widely patent without stenosis, dissection, or occlusion. No significant atheromatous narrowing about the right carotid bifurcation. Left carotid system:  Left common and internal carotid arteries are widely patent without stenosis, dissection, or occlusion. No significant atheromatous narrowing about the left carotid bifurcation. Vertebral arteries: Left vertebral artery diffusely hypoplastic and arises from the aortic arch. Vertebral arteries widely patent within the neck without stenosis, dissection, or occlusion. Skeleton: No acute osseous abnormality. No worrisome lytic or blastic osseous lesions. Other neck: No acute soft tissue abnormality within the neck. No adenopathy. Salivary glands within normal limits. Thyroid within normal limits. Upper chest: Visualized upper chest demonstrates no acute abnormality. Emphysema. Visualized lungs are otherwise clear. Review of the MIP images confirms the above findings CTA HEAD FINDINGS Anterior circulation: Petrous segments widely patent. Cavernous and supraclinoid segments patent without high-grade stenosis. ICA termini patent. A1 segments patent. Anterior communicating artery patent and normal. Anterior cerebral arteries patent to their distal aspects without high-grade stenosis. Left M1 segment patent without stenosis. No proximal left M2 occlusion. Distal left MCA branches well perfused. Right M1 segment patent to its distal aspect. There is abrupt occlusion of the distal right M1 segment at the base of the sylvian fissure (series 13, image 99). This appears to be subocclusive with flow seen distally (series 13, image 94). No other proximal or large vessel occlusion. Asymmetric prominence and flow within the distal right MCA branches with left meningeal enhancement as compared to the left cerebral hemisphere, suspected to be related to collateral flow. Right MCA branches otherwise perfused. Posterior circulation: Right vertebral artery patent to the vertebrobasilar junction without high-grade stenosis. Hypoplastic left vertebral artery largely terminates in PICA. Basilar artery diminutive but patent to its distal  aspect. Superior cerebral arteries patent bilaterally. Fetal type origin of the PCAs bilaterally. PCAs irregular but patent to their distal aspects. There is diffuse irregularity throughout the intracranial circulation. While this finding may in part be atherosclerotic in nature, the diffuse nature suggest possible vasculopathy. Venous sinuses: Patent. Anatomic variants: Fetal type origin of  the PCAs with diminutive vertebrobasilar system. No aneurysm. Delayed phase: Not performed. Review of the MIP images confirms the above findings CT Brain Perfusion Findings: CBF (<30%) Volume: 0mL Perfusion (Tmax>6.0s) volume: 89mL Mismatch Volume: 89mL Infarction Location:No core infarct by CT perfusion. Ischemic penumbra throughout the mid and posterior right MCA territory, primarily involving the right frontotemporal region. IMPRESSION: 1. Positive CTA for emergent large vessel occlusion involving distal right M1/proximal M2 segment. Flow seen distal to the site of occlusion, suggesting either subocclusive thrombus or possibly recanalization. Superimposed vasospasm may be contributory as well. Aspects equals 8. 2. Ischemic penumbra throughout the mid and posterior right MCA territory, primarily involving the right frontotemporal region. No core infarct identified by perfusion imaging. 3. Asymmetric leptomeningeal enhancement within the right MCA territory, likely related to collateralization. 4. Diffuse irregularity throughout the intracranial arterial circulation. While this finding may in part be atherosclerotic in nature, an underlying vasculopathy could also be considered. 5. Emphysema. Critical Value/emergent results were called by telephone at the time of interpretation on 09/28/2017 at 3:37 am to Dr. Milon Dikes , who verbally acknowledged these results. Electronically Signed   By: Rise Mu M.D.   On: 09/28/2017 04:13   Dg Chest Port 1 View  Result Date: 09/28/2017 CLINICAL DATA:  Acute onset of shortness  of breath. EXAM: PORTABLE CHEST 1 VIEW COMPARISON:  Chest radiograph performed 09/20/2017, and CTA of the chest performed 09/21/2017 FINDINGS: The lungs are well-aerated and clear. There is no evidence of focal opacification, pleural effusion or pneumothorax. The cardiomediastinal silhouette is mildly enlarged. No acute osseous abnormalities are seen. IMPRESSION: Mild cardiomegaly.  Lungs remain grossly clear. Electronically Signed   By: Roanna Raider M.D.   On: 09/28/2017 02:24    Procedures Procedures (including critical care time) CRITICAL CARE Performed by: Tilden Fossa   Total critical care time: 45 minutes  Critical care time was exclusive of separately billable procedures and treating other patients.  Critical care was necessary to treat or prevent imminent or life-threatening deterioration.  Critical care was time spent personally by me on the following activities: development of treatment plan with patient and/or surrogate as well as nursing, discussions with consultants, evaluation of patient's response to treatment, examination of patient, obtaining history from patient or surrogate, ordering and performing treatments and interventions, ordering and review of laboratory studies, ordering and review of radiographic studies, pulse oximetry and re-evaluation of patient's condition.  Medications Ordered in ED Medications  budesonide (PULMICORT) nebulizer solution 0.25 mg (not administered)  ipratropium-albuterol (DUONEB) 0.5-2.5 (3) MG/3ML nebulizer solution 3 mL (not administered)  beclomethasone (QVAR) 80 MCG/ACT inhaler 1 puff (not administered)  benzonatate (TESSALON) capsule 100 mg (not administered)  furosemide (LASIX) tablet 40 mg (not administered)  guaiFENesin-dextromethorphan (ROBITUSSIN DM) 100-10 MG/5ML syrup 5 mL (not administered)   stroke: mapping our early stages of recovery book (not administered)  0.9 %  sodium chloride infusion (not administered)  acetaminophen  (TYLENOL) tablet 650 mg (not administered)    Or  acetaminophen (TYLENOL) solution 650 mg (not administered)    Or  acetaminophen (TYLENOL) suppository 650 mg (not administered)  senna-docusate (Senokot-S) tablet 1 tablet (not administered)  enoxaparin (LOVENOX) injection 40 mg (not administered)  aspirin suppository 300 mg (not administered)    Or  aspirin tablet 325 mg (not administered)  atorvastatin (LIPITOR) tablet 80 mg (not administered)  iopamidol (ISOVUE-370) 76 % injection (100 mLs  Contrast Given 09/28/17 0315)  methylPREDNISolone sodium succinate (SOLU-MEDROL) 125 mg/2 mL injection 125 mg (125 mg Intravenous  Given 09/28/17 0356)  naloxone [facility]) injection 0.4 mg (0.4 mg Intravenous Given 09/28/17 0554)     Initial Impression / Assessment and Plan / ED Course  I have reviewed the triage vital signs and the nursing notes.  Pertinent labs & imaging results that were available during my care of the patient were reviewed by me and considered in my medical decision making (see chart for details).     Pt here with sob, falls.  He is noted to be altered with left sided deficits on exam.  Family is not available for collateral information and patient with respiratory distress requiring bipap.    Imaging is concerning for acute CVA - patient is not a TPA candidate due to duration of sxs.  Critical care consulted for admission for respiratory failure, acute cva.   He was evaluated by critical care in the ED and was able to be removed from bipap - alert and talkative but being evasive on examination.  Plan to admit to medicine service for further evaluation and treatment.    Final Clinical Impressions(s) / ED Diagnoses   Final diagnoses:  None    ED Discharge Orders    None       Tilden Fossa, MD 09/28/17 [phone]

## 2017-09-28 NOTE — Progress Notes (Signed)
Modified Barium Swallow Progress Note  Patient Details  Name: [name] MRN: [id number] Date of Birth: 10-Aug-1976  Today's Date: 09/28/2017  Modified Barium Swallow completed.  Full report located under Chart Review in the Imaging Section.  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 Aspiration occured over the arytenoids likely related to impaired timing of airway closure and delayed swallow trigger at the pyriform sinuses. However, pt was able to clear only penetrates from laryngeal vestibule with cued and spontaneous throat clear/cough. Using chin tuck strategy with nectar thick liquids, the pt only had penetration, which cleared independently with subsequent swallow.  Chin tuck strategy was also used for solid trials to reduce moderate residue. Pt required Mod verbal cues to maintain chin tuck during trials and reduce impulsivity. Oral deficits (weak lingual manipulation, reduced posterior propulsion, lingual residue) resulted in decreased bolus cohesion and oral residue. Given observed aspiration/penetration and oral phase deficits recommend Nectar Thick liquids and Dys 2 (chopped) diet with mandatory chin tuck with each swallow and general aspiration precautions. Pt's degree of dysphagia likley to improve with pharyngeal exercises, increased oral control, and improved mentation. SLP will follow to ensure minimal overt signs of difficulty with prescribed diet and readiness to advance diet/repeat tesing.    Swallow Evaluation Recommendations       SLP Diet Recommendations: Dysphagia 2 (Fine chop) solids;Nectar thick liquid   Liquid Administration via: Cup;Straw   Medication Administration: Crushed with puree   Supervision: Full supervision/cueing for  compensatory strategies   Compensations: Minimize environmental distractions;Slow rate;Small sips/bites;Monitor for anterior loss;Chin tuck   Postural Changes: Seated upright at 90 degrees   Oral Care Recommendations: Oral care BID   Other Recommendations: Order thickener from pharmacy;Prohibited food (jello, ice cream, thin soups);Remove water pitcher  [place] Kalia Vahey SLP Student Clinician   [place] Nazier Neyhart 09/28/2017,4:19 PM

## 2017-09-28 NOTE — H&P (Signed)
History and Physical    [name]:[id number] DOB: November 02, 1975 DOA: 09/28/2017  PCP: Patient, No Pcp Per Patient coming from:  home  Chief Complaint: acute encephalopathy  HPI: Mark [name] is a 42 y.o. male with medical history significant for asthma, CHF with an EF of 20%, chronic kidney disease, cocaine and opiate abuse presents to the emergency department chief complaint altered mental status/frequent falls/worsening shortness of breath. Initial evaluation reveals acute respiratory distress requiring BiPAP an acute encephalopathy. He was evaluated by critical care medicine who recommended a medical admission  Information is obtained from the patient and the chart noting that information from patient may be unreliable due to and cooperative tendencies in the setting of questionable impairment. Patient was sent to the emergency department by his fiance for worsening shortness of breath. On arrival to the emergency department he was tachypnea and placed on BiPAP. He was also given Narcan for which he quickly awakened became uncooperative. Bipap removed he was alert and may refuse to answer questions. Yes requesting his family. At the time of my exam he is alert and oriented to person and place. He reports he has a nebulizer machine at home but has long run out of the medicine. He is not required it for over 2 years. He has no recollection of coming to the hospital in an ambulance. He denies headache dizziness syncope or near-syncope. He denies chest pain palpitations lower extremity edema. He reports left-sided weakness is "a-year-old". He denies fever chills dysuria hematuria frequency or urgency. He reports cocaine and marijuana abuse. He also smokes cigarettes  ED Course: In the emergency department initially he was placed on BiPAP which was quickly removed he was found to have flaccid left upper extremity and CTA of the head revealed acute right MCA although not clear if embolic with  collaterals vs vasospasm per CC note.  Review of Systems: As per HPI otherwise all other systems reviewed and are negative.   Ambulatory Status: ambulates independently is independent with ADLs  Past Medical History:  Diagnosis Date  . Asthma   . Chronic systolic CHF (congestive heart failure) (HCC)   . Cigarette smoker   . History of echocardiogram    a. Echo 5/17 - EF 20-25%, severe diff HK, restrictive physiology, mild to mod MR, severe reduced RVSF, mod RVE, mild RAE, mod TR, PASP 48 mmHg  . Hx of cardiac cath    a. LHC 5/17 - normal coronary arteries. PA 45/25, mean 33, PCWP mean 18  . NICM (nonischemic cardiomyopathy) (HCC)   . Substance abuse (HCC)    cocaine, marijuana    Past Surgical History:  Procedure Laterality Date  . CARDIAC CATHETERIZATION N/A 01/05/2016   Procedure: Right/Left Heart Cath and Coronary Angiography;  Surgeon: Lennette Bihari, MD;  Location: [facility] INVASIVE CV LAB;  Service: Cardiovascular;  Laterality: N/A;  . none      Social History   Socioeconomic History  . Marital status: Single    Spouse name: Not on file  . Number of children: Not on file  . Years of education: Not on file  . Highest education level: Not on file  Social Needs  . Financial resource strain: Not on file  . Food insecurity - worry: Not on file  . Food insecurity - inability: Not on file  . Transportation needs - medical: Not on file  . Transportation needs - non-medical: Not on file  Occupational History  . Not on file  Tobacco Use  .  Smoking status: Current Every Day Smoker    Packs/day: 0.50    Years: 20.00    Pack years: 10.00    Types: Cigarettes  . Smokeless tobacco: Never Used  Substance and Sexual Activity  . Alcohol use: Yes  . Drug use: Yes    Types: Cocaine, Marijuana  . Sexual activity: Not on file  Other Topics Concern  . Not on file  Social History Narrative  . Not on file    Allergies  Allergen Reactions  . Hydrocodone Hives  . Lisinopril  Swelling and Other (See Comments)    Facial swelling/angioedema  . Prednisone Shortness Of Breath, Nausea Only, Swelling and Other (See Comments)    Also made chest feel tight and genital area, legs, and face became swollen badly  . Penicillins Hives and Swelling     Has patient had a PCN reaction causing immediate rash, facial/tongue/throat swelling, SOB or lightheadedness with hypotension: Yes Has patient had a PCN reaction causing severe rash involving mucus membranes or skin necrosis: No Has patient had a PCN reaction that required hospitalization: No Has patient had a PCN reaction occurring within the last 10 years: No If all of the above answers are "NO", then may proceed with Cephalosporin use.     Family History  Problem Relation Age of Onset  . Cardiomyopathy Father        Reports his father has an LVAD  . Heart failure Father   . Hypertension Father   . Deep vein thrombosis Neg Hx     Prior to Admission medications   Medication Sig Start Date End Date Taking? Authorizing Provider  albuterol (PROVENTIL HFA;VENTOLIN HFA) 108 (90 Base) MCG/ACT inhaler Inhale 1-2 puffs into the lungs every 6 (six) hours as needed for wheezing or shortness of breath. 09/22/17  Yes Rai, Ripudeep K, MD  aspirin 81 MG EC tablet Take 1 tablet (81 mg total) by mouth daily. 09/22/17  Yes Rai, Ripudeep K, MD  beclomethasone (QVAR) 80 MCG/ACT inhaler Inhale 1 puff into the lungs 2 (two) times daily. 09/22/17  Yes Rai, Ripudeep K, MD  benzonatate (TESSALON) 100 MG capsule Take 1 capsule (100 mg total) by mouth 3 (three) times daily as needed for cough. [address] 09/22/17  Yes Rai, Ripudeep K, MD  carvedilol (COREG) 3.125 MG tablet Take 1 tablet (3.125 mg total) by mouth 2 (two) times daily with a meal. 09/22/17  Yes Rai, Ripudeep K, MD  furosemide (LASIX) 40 MG tablet Take 1 tablet (40 mg total) by mouth daily. 09/22/17  Yes Rai, Ripudeep K, MD  guaiFENesin-dextromethorphan (ROBITUSSIN DM) 100-10 MG/5ML syrup Take 5  mLs by mouth every 4 (four) hours as needed for cough. 09/22/17  Yes Rai, Ripudeep K, MD  isosorbide-hydrALAZINE (BIDIL) 20-37.5 MG tablet Take 1 tablet by mouth 3 (three) times daily. 09/22/17  Yes Rai, Delene Ruffini, MD    Physical Exam: Vitals:   09/28/17 0545 09/28/17 0600 09/28/17 0615 09/28/17 0630  BP: 113/88 116/88 (!) 110/94 120/87  Pulse: 99 (!) 108 (!) 108 (!) 50  Resp: 19 20 (!) 21 17  Temp:      TempSrc:      SpO2: 100% 97% 99% 95%     General:  Appears calm and comfortable somewhat uncooperative Eyes:  PERRL, EOMI, normal lids, iris ENT:  grossly normal hearing, lips & tongue, his membranes of his mouth are dry but pink Neck:  no LAD, masses or thyromegaly Cardiovascular:  RRR, no m/r/g. No LE edema.  Respiratory:  CTA bilaterally, no w/r/r. Normal respiratory effort. Abdomen:  soft, ntnd, positive bowel sounds throughout no guarding or rebounding Skin:  no rash or induration seen on limited exam Musculoskeletal:  grossly normal tone BUE/BLE, good ROM, no bony abnormality Psychiatric:  grossly normal mood and affect, speech fluent and appropriate, AOx3 Neurologic:  Alert and oriented to self and place. Somewhat uncooperative/agitated. Does not freely answer questions. Right grip 5 out of 5 no grip left hand speech is clear facial symmetry no spontaneous movement of left leg  Labs on Admission: I have personally reviewed following labs and imaging studies  CBC: Recent Labs  Lab 09/28/17 0158 09/28/17 0235  WBC 9.0  --   NEUTROABS 5.5  --   HGB 12.2* 14.3  HCT 36.7* 42.0  MCV 78.6  --   PLT 176  --    Basic Metabolic Panel: Recent Labs  Lab 09/22/17 0257 09/28/17 0158 09/28/17 0235  NA 138 136 137  K 4.4 4.4 4.4  CL 102 101 99*  CO2 26 26  --   GLUCOSE 104* 122* 118*  BUN 18 21* 24*  CREATININE 1.45* 1.57* 1.50*  CALCIUM 8.8* 8.7*  --    GFR: Estimated Creatinine Clearance: 63.5 mL/min (A) (by C-G formula based on SCr of 1.5 mg/dL (H)). Liver Function  Tests: Recent Labs  Lab 09/28/17 0158  AST 105*  ALT 250*  ALKPHOS 96  BILITOT 0.6  PROT 6.1*  ALBUMIN 3.1*   No results for input(s): LIPASE, AMYLASE in the last 168 hours. No results for input(s): AMMONIA in the last 168 hours. Coagulation Profile: No results for input(s): INR, PROTIME in the last 168 hours. Cardiac Enzymes: No results for input(s): CKTOTAL, CKMB, CKMBINDEX, TROPONINI in the last 168 hours. BNP (last 3 results) No results for input(s): PROBNP in the last 8760 hours. HbA1C: No results for input(s): HGBA1C in the last 72 hours. CBG: No results for input(s): GLUCAP in the last 168 hours. Lipid Profile: No results for input(s): CHOL, HDL, LDLCALC, TRIG, CHOLHDL, LDLDIRECT in the last 72 hours. Thyroid Function Tests: No results for input(s): TSH, T4TOTAL, FREET4, T3FREE, THYROIDAB in the last 72 hours. Anemia Panel: No results for input(s): VITAMINB12, FOLATE, FERRITIN, TIBC, IRON, RETICCTPCT in the last 72 hours. Urine analysis:    Component Value Date/Time   COLORURINE YELLOW 09/28/2017 0615   APPEARANCEUR CLEAR 09/28/2017 0615   LABSPEC 1.019 09/28/2017 0615   PHURINE 6.0 09/28/2017 0615   GLUCOSEU NEGATIVE 09/28/2017 0615   HGBUR NEGATIVE 09/28/2017 0615   BILIRUBINUR NEGATIVE 09/28/2017 0615   KETONESUR NEGATIVE 09/28/2017 0615   PROTEINUR 100 (A) 09/28/2017 0615   NITRITE NEGATIVE 09/28/2017 0615   LEUKOCYTESUR NEGATIVE 09/28/2017 0615    Creatinine Clearance: Estimated Creatinine Clearance: 63.5 mL/min (A) (by C-G formula based on SCr of 1.5 mg/dL (H)).  Sepsis Labs: @LABRCNTIP (procalcitonin:4,lacticidven:4) )No results found for this or any previous visit (from the past 240 hour(s)).   Radiological Exams on Admission: Ct Angio Head W Or Wo Contrast  Result Date: 09/28/2017 CLINICAL DATA:  Initial evaluation for acute left-sided weakness, falls. EXAM: CT ANGIOGRAPHY HEAD AND NECK CT PERFUSION BRAIN TECHNIQUE: Multidetector CT imaging of the  head and neck was performed using the standard protocol during bolus administration of intravenous contrast. Multiplanar CT image reconstructions and MIPs were obtained to evaluate the vascular anatomy. Carotid stenosis measurements (when applicable) are obtained utilizing NASCET criteria, using the distal internal carotid diameter as the denominator. Multiphase CT imaging of the brain  was performed following IV bolus contrast injection. Subsequent parametric perfusion maps were calculated using RAPID software. CONTRAST:  ISOVUE-370 IOPAMIDOL (ISOVUE-370) INJECTION 76% COMPARISON:  None. FINDINGS: CT HEAD FINDINGS Brain: Examination mildly limited by patient positioning. No acute intracranial hemorrhage. Abnormal hypodensity seen involving the right insular cortex, concerning for evolving right MCA territory infarct (series 3, image 19). Possible extension into the adjacent into cortex lateral to the insular ribbon. Basal ganglia and internal capsule are spared at this time. No other acute large vessel territory infarct. No mass lesion or midline shift. No hydrocephalus. No extra-axial fluid collection. Vascular: Abnormal hyperdensity within distal right M1 and/or M2 branch at the base of the sylvian fissure (series 3, image 17), concerning for thrombus. Skull: Scalp soft tissues and calvarium within normal limits. 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 Mild right sphenoid sinus opacification. Paranasal sinuses are otherwise clear. Mastoids are clear. No acute abnormality about the globes or orbits. Defect noted at the posterior left globe. Other: None. ASPECTS (Alberta Stroke Program Early CT Score) - Ganglionic level infarction (caudate, lentiform nuclei, internal capsule, insula, M1-M3 cortex): 5 - Supraganglionic infarction (M4-M6 cortex): 3 Total score (0-10 with 10 being normal): 8 Review of the MIP images confirms the above findings CTA NECK  FINDINGS Aortic arch: Visualized aortic arch of normal caliber with normal branch pattern. No flow-limiting stenosis about the origin of the great vessels. Visualized subclavian arteries patent. Right carotid system: Right common and internal carotid arteries are widely patent without stenosis, dissection, or occlusion. No significant atheromatous narrowing about the right carotid bifurcation. Left carotid system: Left common and internal carotid arteries are widely patent without stenosis, dissection, or occlusion. No significant atheromatous narrowing about the left carotid bifurcation. Vertebral arteries: Left vertebral artery diffusely hypoplastic and arises from the aortic arch. Vertebral arteries widely patent within the neck without stenosis, dissection, or occlusion. Skeleton: No acute osseous abnormality. No worrisome lytic or blastic osseous lesions. Other neck: No acute soft tissue abnormality within the neck. No adenopathy. Salivary glands within normal limits. Thyroid within normal limits. Upper chest: Visualized upper chest demonstrates no acute abnormality. Emphysema. Visualized lungs are otherwise clear. Review of the MIP images confirms the above findings CTA HEAD FINDINGS Anterior circulation: Petrous segments widely patent. Cavernous and supraclinoid segments patent without high-grade stenosis. ICA termini patent. A1 segments patent. Anterior communicating artery patent and normal. Anterior cerebral arteries patent to their distal aspects without high-grade stenosis. Left M1 segment patent without stenosis. No proximal left M2 occlusion. Distal left MCA branches well perfused. Right M1 segment patent to its distal aspect. There is abrupt occlusion of the distal right M1 segment at the base of the sylvian fissure (series 13, image 99). This appears to be subocclusive with flow seen distally (series 13, image 94). No other proximal or large vessel occlusion. Asymmetric prominence and flow within the  distal right MCA branches with left meningeal enhancement as compared to the left cerebral hemisphere, suspected to be related to collateral flow. Right MCA branches otherwise perfused. Posterior circulation: Right vertebral artery patent to the vertebrobasilar junction without high-grade stenosis. Hypoplastic left vertebral artery largely terminates in PICA. Basilar artery diminutive but patent to its distal aspect. Superior cerebral arteries patent bilaterally. Fetal type origin of the PCAs bilaterally. PCAs irregular but patent to their distal aspects. There is diffuse irregularity throughout the intracranial circulation. While this finding may in part be atherosclerotic in nature, the diffuse nature suggest possible vasculopathy. Venous sinuses: Patent. Anatomic  variants: Fetal type origin of the PCAs with diminutive vertebrobasilar system. No aneurysm. Delayed phase: Not performed. Review of the MIP images confirms the above findings CT Brain Perfusion Findings: CBF (<30%) Volume: 0mL Perfusion (Tmax>6.0s) volume: 89mL Mismatch Volume: 89mL Infarction Location:No core infarct by CT perfusion. Ischemic penumbra throughout the mid and posterior right MCA territory, primarily involving the right frontotemporal region. IMPRESSION: 1. Positive CTA for emergent large vessel occlusion involving distal right M1/proximal M2 segment. Flow seen distal to the site of occlusion, suggesting either subocclusive thrombus or possibly recanalization. Superimposed vasospasm may be contributory as well. Aspects equals 8. 2. Ischemic penumbra throughout the mid and posterior right MCA territory, primarily involving the right frontotemporal region. No core infarct identified by perfusion imaging. 3. Asymmetric leptomeningeal enhancement within the right MCA territory, likely related to collateralization. 4. Diffuse irregularity throughout the intracranial arterial circulation. While this finding may in part be atherosclerotic in  nature, an underlying vasculopathy could also be considered. 5. Emphysema. Critical Value/emergent results were called by telephone at the time of interpretation on 09/28/2017 at 3:37 am to Dr. Milon Dikes , who verbally acknowledged these results. Electronically Signed   By: Rise Mu M.D.   On: 09/28/2017 04:13   Ct Head Wo Contrast  Result Date: 09/28/2017 CLINICAL DATA:  Initial evaluation for acute left-sided weakness, falls. EXAM: CT ANGIOGRAPHY HEAD AND NECK CT PERFUSION BRAIN TECHNIQUE: Multidetector CT imaging of the head and neck was performed using the standard protocol during bolus administration of intravenous contrast. Multiplanar CT image reconstructions and MIPs were obtained to evaluate the vascular anatomy. Carotid stenosis measurements (when applicable) are obtained utilizing NASCET criteria, using the distal internal carotid diameter as the denominator. Multiphase CT imaging of the brain was performed following IV bolus contrast injection. Subsequent parametric perfusion maps were calculated using RAPID software. CONTRAST:  ISOVUE-370 IOPAMIDOL (ISOVUE-370) INJECTION 76% COMPARISON:  None. FINDINGS: CT HEAD FINDINGS Brain: Examination mildly limited by patient positioning. No acute intracranial hemorrhage. Abnormal hypodensity seen involving the right insular cortex, concerning for evolving right MCA territory infarct (series 3, image 19). Possible extension into the adjacent into cortex lateral to the insular ribbon. Basal ganglia and internal capsule are spared at this time. No other acute large vessel territory infarct. No mass lesion or midline shift. No hydrocephalus. No extra-axial fluid collection. Vascular: Abnormal hyperdensity within distal right M1 and/or M2 branch at the base of the sylvian fissure (series 3, image 17), concerning for thrombus. Skull: Scalp soft tissues and calvarium within normal limits. 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 Mild right sphenoid sinus opacification. Paranasal sinuses are otherwise clear. Mastoids are clear. No acute abnormality about the globes or orbits. Defect noted at the posterior left globe. Other: None. ASPECTS (Alberta Stroke Program Early CT Score) - Ganglionic level infarction (caudate, lentiform nuclei, internal capsule, insula, M1-M3 cortex): 5 - Supraganglionic infarction (M4-M6 cortex): 3 Total score (0-10 with 10 being normal): 8 Review of the MIP images confirms the above findings CTA NECK FINDINGS Aortic arch: Visualized aortic arch of normal caliber with normal branch pattern. No flow-limiting stenosis about the origin of the great vessels. Visualized subclavian arteries patent. Right carotid system: Right common and internal carotid arteries are widely patent without stenosis, dissection, or occlusion. No significant atheromatous narrowing about the right carotid bifurcation. Left carotid system: Left common and internal carotid arteries are widely patent without stenosis, dissection, or occlusion. No significant atheromatous narrowing about the left carotid bifurcation. Vertebral arteries:  Left vertebral artery diffusely hypoplastic and arises from the aortic arch. Vertebral arteries widely patent within the neck without stenosis, dissection, or occlusion. Skeleton: No acute osseous abnormality. No worrisome lytic or blastic osseous lesions. Other neck: No acute soft tissue abnormality within the neck. No adenopathy. Salivary glands within normal limits. Thyroid within normal limits. Upper chest: Visualized upper chest demonstrates no acute abnormality. Emphysema. Visualized lungs are otherwise clear. Review of the MIP images confirms the above findings CTA HEAD FINDINGS Anterior circulation: Petrous segments widely patent. Cavernous and supraclinoid segments patent without high-grade stenosis. ICA termini patent. A1 segments patent. Anterior communicating  artery patent and normal. Anterior cerebral arteries patent to their distal aspects without high-grade stenosis. Left M1 segment patent without stenosis. No proximal left M2 occlusion. Distal left MCA branches well perfused. Right M1 segment patent to its distal aspect. There is abrupt occlusion of the distal right M1 segment at the base of the sylvian fissure (series 13, image 99). This appears to be subocclusive with flow seen distally (series 13, image 94). No other proximal or large vessel occlusion. Asymmetric prominence and flow within the distal right MCA branches with left meningeal enhancement as compared to the left cerebral hemisphere, suspected to be related to collateral flow. Right MCA branches otherwise perfused. Posterior circulation: Right vertebral artery patent to the vertebrobasilar junction without high-grade stenosis. Hypoplastic left vertebral artery largely terminates in PICA. Basilar artery diminutive but patent to its distal aspect. Superior cerebral arteries patent bilaterally. Fetal type origin of the PCAs bilaterally. PCAs irregular but patent to their distal aspects. There is diffuse irregularity throughout the intracranial circulation. While this finding may in part be atherosclerotic in nature, the diffuse nature suggest possible vasculopathy. Venous sinuses: Patent. Anatomic variants: Fetal type origin of the PCAs with diminutive vertebrobasilar system. No aneurysm. Delayed phase: Not performed. Review of the MIP images confirms the above findings CT Brain Perfusion Findings: CBF (<30%) Volume: 37mL Perfusion (Tmax>6.0s) volume: 41mL Mismatch Volume: 81mL Infarction Location:No core infarct by CT perfusion. Ischemic penumbra throughout the mid and posterior right MCA territory, primarily involving the right frontotemporal region. IMPRESSION: 1. Positive CTA for emergent large vessel occlusion involving distal right M1/proximal M2 segment. Flow seen distal to the site of occlusion,  suggesting either subocclusive thrombus or possibly recanalization. Superimposed vasospasm may be contributory as well. Aspects equals 8. 2. Ischemic penumbra throughout the mid and posterior right MCA territory, primarily involving the right frontotemporal region. No core infarct identified by perfusion imaging. 3. Asymmetric leptomeningeal enhancement within the right MCA territory, likely related to collateralization. 4. Diffuse irregularity throughout the intracranial arterial circulation. While this finding may in part be atherosclerotic in nature, an underlying vasculopathy could also be considered. 5. Emphysema. Critical Value/emergent results were called by telephone at the time of interpretation on 09/28/2017 at 3:37 am to Dr. Milon Dikes , who verbally acknowledged these results. Electronically Signed   By: Rise Mu M.D.   On: 09/28/2017 04:13   Ct Angio Neck W And/or Wo Contrast  Result Date: 09/28/2017 CLINICAL DATA:  Initial evaluation for acute left-sided weakness, falls. EXAM: CT ANGIOGRAPHY HEAD AND NECK CT PERFUSION BRAIN TECHNIQUE: Multidetector CT imaging of the head and neck was performed using the standard protocol during bolus administration of intravenous contrast. Multiplanar CT image reconstructions and MIPs were obtained to evaluate the vascular anatomy. Carotid stenosis measurements (when applicable) are obtained utilizing NASCET criteria, using the distal internal carotid diameter as the denominator. Multiphase CT imaging of the brain was  performed following IV bolus contrast injection. Subsequent parametric perfusion maps were calculated using RAPID software. CONTRAST:  ISOVUE-370 IOPAMIDOL (ISOVUE-370) INJECTION 76% COMPARISON:  None. FINDINGS: CT HEAD FINDINGS Brain: Examination mildly limited by patient positioning. No acute intracranial hemorrhage. Abnormal hypodensity seen involving the right insular cortex, concerning for evolving right MCA territory infarct  (series 3, image 19). Possible extension into the adjacent into cortex lateral to the insular ribbon. Basal ganglia and internal capsule are spared at this time. No other acute large vessel territory infarct. No mass lesion or midline shift. No hydrocephalus. No extra-axial fluid collection. Vascular: Abnormal hyperdensity within distal right M1 and/or M2 branch at the base of the sylvian fissure (series 3, image 17), concerning for thrombus. Skull: Scalp soft tissues and calvarium within normal limits. 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 Mild right sphenoid sinus opacification. Paranasal sinuses are otherwise clear. Mastoids are clear. No acute abnormality about the globes or orbits. Defect noted at the posterior left globe. Other: None. ASPECTS (Alberta Stroke Program Early CT Score) - Ganglionic level infarction (caudate, lentiform nuclei, internal capsule, insula, M1-M3 cortex): 5 - Supraganglionic infarction (M4-M6 cortex): 3 Total score (0-10 with 10 being normal): 8 Review of the MIP images confirms the above findings CTA NECK FINDINGS Aortic arch: Visualized aortic arch of normal caliber with normal branch pattern. No flow-limiting stenosis about the origin of the great vessels. Visualized subclavian arteries patent. Right carotid system: Right common and internal carotid arteries are widely patent without stenosis, dissection, or occlusion. No significant atheromatous narrowing about the right carotid bifurcation. Left carotid system: Left common and internal carotid arteries are widely patent without stenosis, dissection, or occlusion. No significant atheromatous narrowing about the left carotid bifurcation. Vertebral arteries: Left vertebral artery diffusely hypoplastic and arises from the aortic arch. Vertebral arteries widely patent within the neck without stenosis, dissection, or occlusion. Skeleton: No acute osseous abnormality. No worrisome  lytic or blastic osseous lesions. Other neck: No acute soft tissue abnormality within the neck. No adenopathy. Salivary glands within normal limits. Thyroid within normal limits. Upper chest: Visualized upper chest demonstrates no acute abnormality. Emphysema. Visualized lungs are otherwise clear. Review of the MIP images confirms the above findings CTA HEAD FINDINGS Anterior circulation: Petrous segments widely patent. Cavernous and supraclinoid segments patent without high-grade stenosis. ICA termini patent. A1 segments patent. Anterior communicating artery patent and normal. Anterior cerebral arteries patent to their distal aspects without high-grade stenosis. Left M1 segment patent without stenosis. No proximal left M2 occlusion. Distal left MCA branches well perfused. Right M1 segment patent to its distal aspect. There is abrupt occlusion of the distal right M1 segment at the base of the sylvian fissure (series 13, image 99). This appears to be subocclusive with flow seen distally (series 13, image 94). No other proximal or large vessel occlusion. Asymmetric prominence and flow within the distal right MCA branches with left meningeal enhancement as compared to the left cerebral hemisphere, suspected to be related to collateral flow. Right MCA branches otherwise perfused. Posterior circulation: Right vertebral artery patent to the vertebrobasilar junction without high-grade stenosis. Hypoplastic left vertebral artery largely terminates in PICA. Basilar artery diminutive but patent to its distal aspect. Superior cerebral arteries patent bilaterally. Fetal type origin of the PCAs bilaterally. PCAs irregular but patent to their distal aspects. There is diffuse irregularity throughout the intracranial circulation. While this finding may in part be atherosclerotic in nature, the diffuse nature suggest possible vasculopathy. Venous sinuses: Patent. Anatomic variants:  Fetal type origin of the PCAs with diminutive  vertebrobasilar system. No aneurysm. Delayed phase: Not performed. Review of the MIP images confirms the above findings CT Brain Perfusion Findings: CBF (<30%) Volume: 0mL Perfusion (Tmax>6.0s) volume: 89mL Mismatch Volume: 89mL Infarction Location:No core infarct by CT perfusion. Ischemic penumbra throughout the mid and posterior right MCA territory, primarily involving the right frontotemporal region. IMPRESSION: 1. Positive CTA for emergent large vessel occlusion involving distal right M1/proximal M2 segment. Flow seen distal to the site of occlusion, suggesting either subocclusive thrombus or possibly recanalization. Superimposed vasospasm may be contributory as well. Aspects equals 8. 2. Ischemic penumbra throughout the mid and posterior right MCA territory, primarily involving the right frontotemporal region. No core infarct identified by perfusion imaging. 3. Asymmetric leptomeningeal enhancement within the right MCA territory, likely related to collateralization. 4. Diffuse irregularity throughout the intracranial arterial circulation. While this finding may in part be atherosclerotic in nature, an underlying vasculopathy could also be considered. 5. Emphysema. Critical Value/emergent results were called by telephone at the time of interpretation on 09/28/2017 at 3:37 am to Dr. Milon Dikes , who verbally acknowledged these results. Electronically Signed   By: Rise Mu M.D.   On: 09/28/2017 04:13   Ct Cerebral Perfusion W Contrast  Result Date: 09/28/2017 CLINICAL DATA:  Initial evaluation for acute left-sided weakness, falls. EXAM: CT ANGIOGRAPHY HEAD AND NECK CT PERFUSION BRAIN TECHNIQUE: Multidetector CT imaging of the head and neck was performed using the standard protocol during bolus administration of intravenous contrast. Multiplanar CT image reconstructions and MIPs were obtained to evaluate the vascular anatomy. Carotid stenosis measurements (when applicable) are obtained utilizing  NASCET criteria, using the distal internal carotid diameter as the denominator. Multiphase CT imaging of the brain was performed following IV bolus contrast injection. Subsequent parametric perfusion maps were calculated using RAPID software. CONTRAST:  ISOVUE-370 IOPAMIDOL (ISOVUE-370) INJECTION 76% COMPARISON:  None. FINDINGS: CT HEAD FINDINGS Brain: Examination mildly limited by patient positioning. No acute intracranial hemorrhage. Abnormal hypodensity seen involving the right insular cortex, concerning for evolving right MCA territory infarct (series 3, image 19). Possible extension into the adjacent into cortex lateral to the insular ribbon. Basal ganglia and internal capsule are spared at this time. No other acute large vessel territory infarct. No mass lesion or midline shift. No hydrocephalus. No extra-axial fluid collection. Vascular: Abnormal hyperdensity within distal right M1 and/or M2 branch at the base of the sylvian fissure (series 3, image 17), concerning for thrombus. Skull: Scalp soft tissues and calvarium within normal limits. 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 Mild right sphenoid sinus opacification. Paranasal sinuses are otherwise clear. Mastoids are clear. No acute abnormality about the globes or orbits. Defect noted at the posterior left globe. Other: None. ASPECTS (Alberta Stroke Program Early CT Score) - Ganglionic level infarction (caudate, lentiform nuclei, internal capsule, insula, M1-M3 cortex): 5 - Supraganglionic infarction (M4-M6 cortex): 3 Total score (0-10 with 10 being normal): 8 Review of the MIP images confirms the above findings CTA NECK FINDINGS Aortic arch: Visualized aortic arch of normal caliber with normal branch pattern. No flow-limiting stenosis about the origin of the great vessels. Visualized subclavian arteries patent. Right carotid system: Right common and internal carotid arteries are widely patent  without stenosis, dissection, or occlusion. No significant atheromatous narrowing about the right carotid bifurcation. Left carotid system: Left common and internal carotid arteries are widely patent without stenosis, dissection, or occlusion. No significant atheromatous narrowing about the left carotid bifurcation. Vertebral  arteries: Left vertebral artery diffusely hypoplastic and arises from the aortic arch. Vertebral arteries widely patent within the neck without stenosis, dissection, or occlusion. Skeleton: No acute osseous abnormality. No worrisome lytic or blastic osseous lesions. Other neck: No acute soft tissue abnormality within the neck. No adenopathy. Salivary glands within normal limits. Thyroid within normal limits. Upper chest: Visualized upper chest demonstrates no acute abnormality. Emphysema. Visualized lungs are otherwise clear. Review of the MIP images confirms the above findings CTA HEAD FINDINGS Anterior circulation: Petrous segments widely patent. Cavernous and supraclinoid segments patent without high-grade stenosis. ICA termini patent. A1 segments patent. Anterior communicating artery patent and normal. Anterior cerebral arteries patent to their distal aspects without high-grade stenosis. Left M1 segment patent without stenosis. No proximal left M2 occlusion. Distal left MCA branches well perfused. Right M1 segment patent to its distal aspect. There is abrupt occlusion of the distal right M1 segment at the base of the sylvian fissure (series 13, image 99). This appears to be subocclusive with flow seen distally (series 13, image 94). No other proximal or large vessel occlusion. Asymmetric prominence and flow within the distal right MCA branches with left meningeal enhancement as compared to the left cerebral hemisphere, suspected to be related to collateral flow. Right MCA branches otherwise perfused. Posterior circulation: Right vertebral artery patent to the vertebrobasilar junction  without high-grade stenosis. Hypoplastic left vertebral artery largely terminates in PICA. Basilar artery diminutive but patent to its distal aspect. Superior cerebral arteries patent bilaterally. Fetal type origin of the PCAs bilaterally. PCAs irregular but patent to their distal aspects. There is diffuse irregularity throughout the intracranial circulation. While this finding may in part be atherosclerotic in nature, the diffuse nature suggest possible vasculopathy. Venous sinuses: Patent. Anatomic variants: Fetal type origin of the PCAs with diminutive vertebrobasilar system. No aneurysm. Delayed phase: Not performed. Review of the MIP images confirms the above findings CT Brain Perfusion Findings: CBF (<30%) Volume: 0mL Perfusion (Tmax>6.0s) volume: 89mL Mismatch Volume: 89mL Infarction Location:No core infarct by CT perfusion. Ischemic penumbra throughout the mid and posterior right MCA territory, primarily involving the right frontotemporal region. IMPRESSION: 1. Positive CTA for emergent large vessel occlusion involving distal right M1/proximal M2 segment. Flow seen distal to the site of occlusion, suggesting either subocclusive thrombus or possibly recanalization. Superimposed vasospasm may be contributory as well. Aspects equals 8. 2. Ischemic penumbra throughout the mid and posterior right MCA territory, primarily involving the right frontotemporal region. No core infarct identified by perfusion imaging. 3. Asymmetric leptomeningeal enhancement within the right MCA territory, likely related to collateralization. 4. Diffuse irregularity throughout the intracranial arterial circulation. While this finding may in part be atherosclerotic in nature, an underlying vasculopathy could also be considered. 5. Emphysema. Critical Value/emergent results were called by telephone at the time of interpretation on 09/28/2017 at 3:37 am to Dr. Milon Dikes , who verbally acknowledged these results. Electronically Signed    By: Rise Mu M.D.   On: 09/28/2017 04:13   Dg Chest Port 1 View  Result Date: 09/28/2017 CLINICAL DATA:  Acute onset of shortness of breath. EXAM: PORTABLE CHEST 1 VIEW COMPARISON:  Chest radiograph performed 09/20/2017, and CTA of the chest performed 09/21/2017 FINDINGS: The lungs are well-aerated and clear. There is no evidence of focal opacification, pleural effusion or pneumothorax. The cardiomediastinal silhouette is mildly enlarged. No acute osseous abnormalities are seen. IMPRESSION: Mild cardiomegaly.  Lungs remain grossly clear. Electronically Signed   By: Roanna Raider M.D.   On: 09/28/2017 02:24  EKG: Pending at time of admission  Assessment/Plan Principal Problem:   Acute CVA (cerebrovascular accident) [facility]) Active Problems:   Tobacco abuse   Elevated troponin   NICM (nonischemic cardiomyopathy) (HCC)   CKD (chronic kidney disease) stage 2, GFR 60-89 ml/min   Cocaine abuse (HCC)   CHF (congestive heart failure) (HCC)   #1. CVA. Evaluated by neurology in the emergency department opined right MCA stroke secondary to cardiomyopathy versus vasospasm from cocaine use or existing nonischemic cardiomyopathy recommend medical admission -Admit to telemetry -MRI of the brain -Echocardiogram -Hemoglobin A1c, lipid panel -Frequent neurochecks -Aspirin and Lipitor per neurology recommendation -This point patient failed bedside swallow eval mostly related to uncooperation. Will keep him nothing by mouth until he passes -Physical therapy/occupational therapy/speech therapy -Appreciate neuro assistance  #2. Acute respiratory distress likely related to mild asthma exacerbation. Initially patient was on BiPAP. At the time of admission he was off BiPAP oxygen saturation level greater than 90% on nasal cannula. Chest x-ray shows mild cardiomegaly with lungs grossly clear. Evaluated by critical care who opined CBG showed only very mild acute on chronic hypercapnia recommended  continuation of home nebs and Qvar -Oxygen saturation level greater than 90% on 2 L nasal cannula -Continue oxygen supplementation -Wean oxygen as able -continue home meds per recommendation  3. History of CHF. Appears compensated. Chest x-ray as noted above. Echocardiogram done one month ago reveals an EF of 20% mild LVH. Home medications include Lasix. Has an elevated BNP. -Continue Lasix -Monitor intake and output -Daily weights  #4.  Elevated troponin/NSTEMI. Evaluated by critical care who opined likely a type II from demand ischemia. Initial troponin 0.36. Patient is asymptomatic. -Follow EKG -Cycle troponin -Monitor  #5. Chronic kidney disease stage III. Creatinine 1.5 on admission. Appears his baselines around 1.45. -Avoid nephrotoxins -Monitor urine output -Recheck in the morning  #6. Cocaine abuse. Urine drug screen positive for cocaine  #7. Tobacco use -cessation counseling offered     DVT prophylaxis: lovenox  Code Status: full  Family Communication: none present  Disposition Plan: home  Consults called: [facility]  Admission status: inpatient    Gwenyth Bender MD [facility]  If 7PM-7AM, please contact night-coverage [email or web address] Password [email or web address]  09/28/2017, 8:07 AM

## 2017-09-28 NOTE — ED Notes (Signed)
Foley cath removed per pt's request.  Urinal given to pt

## 2017-09-28 NOTE — ED Notes (Signed)
Pt alert and oriented, talked to wife on telephone.  Pt requesting something to eat

## 2017-09-28 NOTE — ED Notes (Signed)
Patient transported to X-ray 

## 2017-09-28 NOTE — ED Notes (Signed)
Dinner tray ordered for pt

## 2017-09-28 NOTE — Progress Notes (Signed)
NEUROHOSPITALISTS STROKE TEAM - DAILY PROGRESS NOTE   ADMISSION HISTORY: Mark [name] is a 42 y.o. male who has a past medical history of cocaine and marijuana abuse, chronic systolic CHF (non ischemic cardiomyopathy), tobacco abuse, asthma, who presented to the emergency room for evaluation of multiple falls and shortness of breath.  It is unclear when his last known normal was.   He was evaluated in the emergency room by the ED providers and noted to have left-sided weakness.  He says that this weakness is off and on now going on for over a year.  He was in the emergency room a week ago and did not have any focal weakness.  The patient reports that he has off-and-on numbness of the left arm and leg and that makes it difficult for him to move that side.   Upon the examination during the early part of this encounter, he was completely flaccid on his left upper extremity but later on was able to move the left upper extremity antigravity with some drift.   He is currently on a BiPAP as he had hypercarbic respiratory failure on arrival.  LKW: unclear. Best per history - talked to family at 1700 hrs on 09/27/17 and speech was normal at the time. No clear time of last witnessed normal. Attempted to reach the number for emergency contact on the chart, but unsuccessful. tpa given?: no, OSW Premorbid modified Rankin scale (mRS): 1 NIHSS 1a Level of Conscious.: 1 1b LOC Questions: 1 1c LOC Commands: 0 2 Best Gaze: 1 3 Visual: 1 4 Facial Palsy: 0 5a Motor Arm - left: 1 5b Motor Arm - Right: 0 6a Motor Leg - Left: 1 6b Motor Leg - Right: 0 7 Limb Ataxia: 0 8 Sensory: 0 9 Best Language: 0 10 Dysarthria: 1 11 Extinct. and Inatten.: 0 TOTAL: 7  SUBJECTIVE (INTERVAL HISTORY) No family is at the bedside. Patient is found laying in bed in the hallway in the ER in NAD. Overall he feels his condition is unchanged. Voices no new complaints. No new  events reported overnight.   OBJECTIVE Lab Results: CBC:  Recent Labs  Lab 09/28/17 0158 09/28/17 0235  WBC 9.0  --   HGB 12.2* 14.3  HCT 36.7* 42.0  MCV 78.6  --   PLT 176  --    BMP: Recent Labs  Lab 09/22/17 0257 09/28/17 0158 09/28/17 0235  NA 138 136 137  K 4.4 4.4 4.4  CL 102 101 99*  CO2 26 26  --   GLUCOSE 104* 122* 118*  BUN 18 21* 24*  CREATININE 1.45* 1.57* 1.50*  CALCIUM 8.8* 8.7*  --    Liver Function Tests:  Recent Labs  Lab 09/28/17 0158  AST 105*  ALT 250*  ALKPHOS 96  BILITOT 0.6  PROT 6.1*  ALBUMIN 3.1*   Cardiac Enzymes:  Recent Labs  Lab 09/28/17 1101  TROPONINI 0.34*   Urinalysis:  Recent Labs  Lab 09/28/17 0615  COLORURINE YELLOW  APPEARANCEUR CLEAR  LABSPEC 1.019  PHURINE 6.0  GLUCOSEU NEGATIVE  HGBUR NEGATIVE  BILIRUBINUR NEGATIVE  KETONESUR NEGATIVE  PROTEINUR 100*  NITRITE NEGATIVE  LEUKOCYTESUR NEGATIVE   Urine Drug  Screen:     Component Value Date/Time   LABOPIA NONE DETECTED 09/28/2017 0615   COCAINSCRNUR POSITIVE (A) 09/28/2017 0615   LABBENZ NONE DETECTED 09/28/2017 0615   AMPHETMU NONE DETECTED 09/28/2017 0615   THCU POSITIVE (A) 09/28/2017 0615   LABBARB NONE DETECTED 09/28/2017 0615    Alcohol Level:  Recent Labs  Lab 09/28/17 0444  ETH <10    PHYSICAL EXAM Temp:  [98.2 F (36.8 C)-99.6 F (37.6 C)] 98.2 F (36.8 C) (02/07 0336) Pulse Rate:  [50-110] 110 (02/07 1545) Resp:  [14-28] 20 (02/07 1545) BP: (103-135)/(73-114) 107/86 (02/07 1545) SpO2:  [94 %-100 %] 98 % (02/07 1545) FiO2 (%):  [40 %] 40 % (02/07 0426) General - Well nourished, well developed middle aged African-American male, in no apparent distress HEENT-  Normocephalic,  Cardiovascular - Regular rate and rhythm  Respiratory - Lungs clear bilaterally. No wheezing. Abdomen - soft and non-tender, BS normal Extremities- no edema or cyanosis Awake alert, follows commands. Speech is dysarthric. Poor attention and  concentration. Able to say he is in hospital. Unable to reliably name simple objects. Unable to repeat CN: PERRL, seems to have right gaze preference but is able to overcome and look to the left. Does not blink to threat from either direction. Face droop on Left. Tongue deviation to left. Motor: RUE and RLE 5/5. LUE/LLE 3/5, Mild Left sided neglect   Sensory: symmetrically diminished all over  Coord: did not perform FNF or HKS Gait testing was deferred  IMAGING: I have personally reviewed the radiological images below and agree with the radiology interpretations. Ct Head Wo Contrast Result Date: 09/28/2017 IMPRESSION: 1. Positive CTA for emergent large vessel occlusion involving distal right M1/proximal M2 segment. Flow seen distal to the site of occlusion, suggesting either subocclusive thrombus or possibly recanalization. Superimposed vasospasm may be contributory as well. Aspects equals 8. 2. Ischemic penumbra throughout the mid and posterior right MCA territory, primarily involving the right frontotemporal region. No core infarct identified by perfusion imaging. 3. Asymmetric leptomeningeal enhancement within the right MCA territory, likely related to collateralization. 4. Diffuse irregularity throughout the intracranial arterial circulation. While this finding may in part be atherosclerotic in nature, an underlying vasculopathy could also be considered. 5. Emphysema.   Mr Brain Wo Contrast Result Date: 09/28/2017 IMPRESSION: Moderate to large right MCA distribution infarct as described. There is associated slow/absent flow within the associated right M2 branch. Electronically Signed   By: Marnee Spring M.D.   On: 09/28/2017 12:09   Ct Angio Head W Or Wo Contrast Ct Angio Neck W And/or Wo Contrast Ct Cerebral Perfusion W Contrast Result Date: 09/28/2017 IMPRESSION: 1. Positive CTA for emergent large vessel occlusion involving distal right M1/proximal M2 segment. Flow seen distal to the site of  occlusion, suggesting either subocclusive thrombus or possibly recanalization. Superimposed vasospasm may be contributory as well. Aspects equals 8. 2. Ischemic penumbra throughout the mid and posterior right MCA territory, primarily involving the right frontotemporal region. No core infarct identified by perfusion imaging. 3. Asymmetric leptomeningeal enhancement within the right MCA territory, likely related to collateralization. 4. Diffuse irregularity throughout the intracranial arterial circulation. While this finding may in part be atherosclerotic in nature, an underlying vasculopathy could also be considered. 5. Emphysema.   Echocardiogram:  PENDING     IMPRESSION: [name] is a 42 y.o. male with PMH of  substance abuse, nonischemic cardiomyopathy, tobacco abuse, asthma, with an unclear last known normal presenting to the emergency room for evaluation of shortness of breath and multiple falls with left-sided weakness that has been ongoing off and on for a year.  CTA shows evolving hypodensity in the Right M1 territory with CTA showing decreased opacification of a short segment of right m1 followed by complete opacification - discussed with neuroradiology and IR Dr. Corliss Skains. Given his poor cardiac status (latest EF 20%) and current respiratory distress, patient was deemed not be a good candidate for IR. No clear time of onset or last seen normal, which was the rationale for not paging out code stroke, and also makes him ineligible for IV-tPA. MRI reveals:  Moderate to large right MCA distribution infarct  Suspected Etiology:  secondary to cardiomyopathy vs vasospasm from cocaine use Resultant Symptoms: left-sided weakness Stroke Risk Factors: diabetes mellitus, hyperlipidemia and hypertension Other Stroke Risk Factors: Advanced age, Cigarette smoker, Polysubstance Abuse, CHF with known EF of 20-25%  Outstanding Stroke Work-up Studies:      Echocardiogram:                                                    PENDING  PLAN  09/28/2017: Continue Aspirin/ Statin for now Frequent neuro checks Telemetry monitoring PT/OT/SLP Consult PM & Rehab Consult Case Management /MSW Ongoing aggressive stroke risk factor management Patient will be counseled to be compliant with his antithrombotic medications Patient will be counseled on Lifestyle modifications including, Diet, Exercise, and Stress Follow up with [facility] in 6 weeks  DYSPHAGIA: Passed SLP swallow evaluation - Dysphasia 2 nectar liquids  Aspiration Precautions in progress  HYPERTENSION: Stable, Avoid Hypotension and Dehydration Permissive hypertension (OK if <220/120) for 24-48 hours post stroke and then gradually normalized within 5-7 days. Long term BP goal normotensive. May slowly restart home B/P medications after 48 hours Home Meds: Bidil, Coreg  HYPERLIPIDEMIA:    Component Value Date/Time   CHOL 113 09/28/2017 0158   TRIG 76 09/28/2017 0158   HDL 30 (L) 09/28/2017 0158   CHOLHDL 3.8 09/28/2017 0158   VLDL 15 09/28/2017 0158   LDLCALC 68 09/28/2017 0158  Home Meds:  NONE LDL  goal < 70 Started on  Lipitor to 20 mg daily Continue statin at discharge  PRE- DIABETES: Lab Results  Component Value Date   HGBA1C 6.4 (H) 09/28/2017  HgbA1c goal < 7.0 Continue CBG monitoring and SSI to maintain glucose 140-180 mg/dl DM education   TOBACCO ABUSE & POLYSUBSTANCE ABUSE UDS+  cocaine and marijuana abuse Current smoker Smoking cessation counseling provided Nicotine patch provided CIWA Protocol PRN  Other Active Problems: Principal Problem:   Acute CVA (cerebrovascular accident) (HCC) Active Problems:   Tobacco abuse   Elevated troponin   NICM (nonischemic cardiomyopathy) (HCC)   CKD (chronic kidney disease) stage 2, GFR 60-89 ml/min   Cocaine abuse (HCC)   CHF (congestive heart failure) [facility])    Hospital day # 0 VTE prophylaxis:  Lovenox  Diet : Fall precautions DIET DYS 2 Room service appropriate? Yes; Fluid consistency: Nectar Thick   FAMILY UPDATES: No family at bedside  TEAM UPDATES: Delaine Lame, MD   Prior Home Stroke Medications:  aspirin  81 mg daily  Discharge Stroke Meds:  Please discharge patient on aspirin 325 mg daily for now  Disposition: 01-Home or Self Care Therapy Recs:               PENDING Follow Up:  Follow-up Information    Micki Riley, MD. Schedule an appointment as soon as possible for a visit in 6 week(s).   Specialties:  Neurology, Radiology Contact information: [address] [place] [address] [phone]          Patient, No Pcp Per -PCP Follow up in 1-2 weeks   Case Management aware of need   Assessment & plan discussed with with attending physician and they are in agreement.    Beryl Meager, ANP-C Stroke Neurology Team 09/28/2017 5:04 PM I have personally examined this patient, reviewed notes, independently viewed imaging studies, participated in medical decision making and plan of care.ROS completed by me personally and pertinent positives fully documented  I have made any additions or clarifications directly to the above note. Agree with note above. He presented with a right middle cerebral artery infarct likely due to cocaine-related vasospasm. Patient is not a candidate for thrombolysis or mechanical thrombectomy at the present time. Recommend admission to the stepdown unit and close neurological monitoring. Check echocardiogram and start antiplatelet therapy and aggressive risk factor modification. Patient counseled to quit marijuana, cocaine and alcohol. Greater than 50% time during this 35 minute visit was spent on counseling and coordination of care about his stroke and answering questions. Discussed with Dr. Lyndal Pulley, MD Medical Director [facility] Pager: [phone] 09/28/2017 6:14 PM  To contact Stroke  Continuity provider, please refer to [email or web address]. After hours, contact General Neurology

## 2017-09-28 NOTE — ED Triage Notes (Addendum)
Patient arrived with EMS from home found on the floor inside bathroom , he received 2 doses of Duoneb and Albuterol treatment by EMS , EMS also noted left side body weakness , left facial droop/slurred speech at scene ( onset last year) , CBG = 125 , history of CHF . Pt. stated ETOH /marijuana today . Evaluated by Dr. Madilyn Hook at arrival .

## 2017-09-28 NOTE — Consult Note (Signed)
PULMONARY / CRITICAL CARE MEDICINE   Name: Mark [name] MRN: [id number] DOB: 02-21-1976    ADMISSION DATE:  09/28/2017 CONSULTATION DATE: 09/28/17  REFERRING MD: Dr Pecola Leisure (ED)  CHIEF COMPLAINT: CVA  HISTORY OF PRESENT ILLNESS:   41yoM with hx Asthma, CHF (EF 20%), CKD, Cocaine and Opiate abuse, who presented to the ER via EMS for complaints of frequent falls and SOB at home. On arrival to the ER patient with tachypnea and increased work of breathing, for which he was placed on BIPAP. At time of my exam patient is lightly sleeping but easily arouses to voice or touch, but even when awakened will not answer simple questions, quickly falling back asleep again. VBG showed no acidosis. On exam lungs CTA b/l. Gave patient narcan after which he awakened but then closed his eyes tight when he noticed the RN was looking at him. I removed BIPAP mask after which patient sat upright in bed, opened eyes wide, but still refused to answer questions. Asked him if he felt he was having difficulty expressing himself or getting the words out. He replied "no not at all. Is my fiancee here? I want my phone." Explained to patient he could have his phone after he answered a few questions. At that time he reported he had felt SOB and some wheezing at home and on ER arrival but none now. He also admits to occasional cough occasionally productive of sputum that is "sometimes" clear. Denied CP. Denied having run out of his inhalers at home. Discussed the need for a urine sample. Patient said he felt unable to urinate; will place foley catheter instead.   PAST MEDICAL HISTORY :  He  has a past medical history of Asthma, Chronic systolic CHF (congestive heart failure) (HCC), Cigarette smoker, History of echocardiogram, cardiac cath, NICM (nonischemic cardiomyopathy) (HCC), and Substance abuse (HCC).  PAST SURGICAL HISTORY: He  has a past surgical history that includes none and Cardiac catheterization (N/A,  01/05/2016).  Allergies  Allergen Reactions  . Hydrocodone Hives  . Lisinopril Swelling and Other (See Comments)    Facial swelling/angioedema  . Prednisone Shortness Of Breath, Nausea Only, Swelling and Other (See Comments)    Also made chest feel tight and genital area, legs, and face became swollen badly  . Penicillins Hives and Swelling     Has patient had a PCN reaction causing immediate rash, facial/tongue/throat swelling, SOB or lightheadedness with hypotension: Yes Has patient had a PCN reaction causing severe rash involving mucus membranes or skin necrosis: No Has patient had a PCN reaction that required hospitalization: No Has patient had a PCN reaction occurring within the last 10 years: No If all of the above answers are "NO", then may proceed with Cephalosporin use.     No current facility-administered medications on file prior to encounter.    Current Outpatient Medications on File Prior to Encounter  Medication Sig  . albuterol (PROVENTIL HFA;VENTOLIN HFA) 108 (90 Base) MCG/ACT inhaler Inhale 1-2 puffs into the lungs every 6 (six) hours as needed for wheezing or shortness of breath.  Marland Kitchen aspirin 81 MG EC tablet Take 1 tablet (81 mg total) by mouth daily.  . beclomethasone (QVAR) 80 MCG/ACT inhaler Inhale 1 puff into the lungs 2 (two) times daily.  . benzonatate (TESSALON) 100 MG capsule Take 1 capsule (100 mg total) by mouth 3 (three) times daily as needed for cough. 340B Chris  . carvedilol (COREG) 3.125 MG tablet Take 1 tablet (3.125 mg total) by mouth 2 (  two) times daily with a meal.  . furosemide (LASIX) 40 MG tablet Take 1 tablet (40 mg total) by mouth daily.  Marland Kitchen guaiFENesin-dextromethorphan (ROBITUSSIN DM) 100-10 MG/5ML syrup Take 5 mLs by mouth every 4 (four) hours as needed for cough.  . isosorbide-hydrALAZINE (BIDIL) 20-37.5 MG tablet Take 1 tablet by mouth 3 (three) times daily.   FAMILY HISTORY:  His indicated that the status of his father is unknown. He  indicated that the status of his neg hx is unknown.  SOCIAL HISTORY: He  reports that he has been smoking cigarettes.  He has a 10.00 pack-year smoking history. he has never used smokeless tobacco. He reports that he drinks alcohol. He reports that he uses drugs. Drugs: Cocaine and Marijuana.  REVIEW OF SYSTEMS:   Review of Systems  Unable to perform ROS: Patient nonverbal   SUBJECTIVE:  Lying on ER stretcher on BIPAP  VITAL SIGNS: BP (!) 113/92   Pulse 95   Temp 98.2 F (36.8 C)   Resp 20   SpO2 100%   INTAKE / OUTPUT: No intake/output data recorded.  PHYSICAL EXAMINATION: General:  WDWN Adult male, lying on ER stretcher on BIPAP BIPAP 40% FIO2 IPAP 12, EPAP 6 (Pox 100% on this) Neuro: initially patient was "playing opossum" and would open eyes only when RN walked away but close eyes when being examined. Open eyes to voice or touch but refuse to answer questions or obey commands. Spontaneous movement of RUE and BLE. LUE flaccid. After giving Narcan, patient now sitting up in bed texting (with right hand) on his cell phone. Able to answer few questions. No slurred speech or dysarthria.  HEENT: OP clear, MM moist Cardiovascular:  RRR no m/r/g Lungs: CTA b/l, RR 12, no respiratory distress Abdomen: Soft NTND Musculoskeletal: no LE edema Skin: no rashes   LABS:  BMET Recent Labs  Lab 09/22/17 0257 09/28/17 0158 09/28/17 0235  NA 138 136 137  K 4.4 4.4 4.4  CL 102 101 99*  CO2 26 26  --   BUN 18 21* 24*  CREATININE 1.45* 1.57* 1.50*  GLUCOSE 104* 122* 118*   Electrolytes Recent Labs  Lab 09/22/17 0257 09/28/17 0158  CALCIUM 8.8* 8.7*   CBC Recent Labs  Lab 09/28/17 0158 09/28/17 0235  WBC 9.0  --   HGB 12.2* 14.3  HCT 36.7* 42.0  PLT 176  --    Coag's No results for input(s): APTT, INR in the last 168 hours.  Sepsis Markers No results for input(s): LATICACIDVEN, PROCALCITON, O2SATVEN in the last 168 hours.  ABG No results for input(s): PHART,  PCO2ART, PO2ART in the last 168 hours.  Liver Enzymes Recent Labs  Lab 09/28/17 0158  AST 105*  ALT 250*  ALKPHOS 96  BILITOT 0.6  ALBUMIN 3.1*   Cardiac Enzymes No results for input(s): TROPONINI, PROBNP in the last 168 hours.  Glucose No results for input(s): GLUCAP in the last 168 hours.  Imaging Ct Angio Head W Or Wo Contrast  Result Date: 09/28/2017 CLINICAL DATA:  Initial evaluation for acute left-sided weakness, falls. EXAM: CT ANGIOGRAPHY HEAD AND NECK CT PERFUSION BRAIN TECHNIQUE: Multidetector CT imaging of the head and neck was performed using the standard protocol during bolus administration of intravenous contrast. Multiplanar CT image reconstructions and MIPs were obtained to evaluate the vascular anatomy. Carotid stenosis measurements (when applicable) are obtained utilizing NASCET criteria, using the distal internal carotid diameter as the denominator. Multiphase CT imaging of the brain was performed following IV  bolus contrast injection. Subsequent parametric perfusion maps were calculated using RAPID software. CONTRAST:  ISOVUE-370 IOPAMIDOL (ISOVUE-370) INJECTION 76% COMPARISON:  None. FINDINGS: CT HEAD FINDINGS Brain: Examination mildly limited by patient positioning. No acute intracranial hemorrhage. Abnormal hypodensity seen involving the right insular cortex, concerning for evolving right MCA territory infarct (series 3, image 19). Possible extension into the adjacent into cortex lateral to the insular ribbon. Basal ganglia and internal capsule are spared at this time. No other acute large vessel territory infarct. No mass lesion or midline shift. No hydrocephalus. No extra-axial fluid collection. Vascular: Abnormal hyperdensity within distal right M1 and/or M2 branch at the base of the sylvian fissure (series 3, image 17), concerning for thrombus. Skull: Scalp soft tissues and calvarium within normal limits. 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 Mild right sphenoid sinus opacification. Paranasal sinuses are otherwise clear. Mastoids are clear. No acute abnormality about the globes or orbits. Defect noted at the posterior left globe. Other: None. ASPECTS (Alberta Stroke Program Early CT Score) - Ganglionic level infarction (caudate, lentiform nuclei, internal capsule, insula, M1-M3 cortex): 5 - Supraganglionic infarction (M4-M6 cortex): 3 Total score (0-10 with 10 being normal): 8 Review of the MIP images confirms the above findings CTA NECK FINDINGS Aortic arch: Visualized aortic arch of normal caliber with normal branch pattern. No flow-limiting stenosis about the origin of the great vessels. Visualized subclavian arteries patent. Right carotid system: Right common and internal carotid arteries are widely patent without stenosis, dissection, or occlusion. No significant atheromatous narrowing about the right carotid bifurcation. Left carotid system: Left common and internal carotid arteries are widely patent without stenosis, dissection, or occlusion. No significant atheromatous narrowing about the left carotid bifurcation. Vertebral arteries: Left vertebral artery diffusely hypoplastic and arises from the aortic arch. Vertebral arteries widely patent within the neck without stenosis, dissection, or occlusion. Skeleton: No acute osseous abnormality. No worrisome lytic or blastic osseous lesions. Other neck: No acute soft tissue abnormality within the neck. No adenopathy. Salivary glands within normal limits. Thyroid within normal limits. Upper chest: Visualized upper chest demonstrates no acute abnormality. Emphysema. Visualized lungs are otherwise clear. Review of the MIP images confirms the above findings CTA HEAD FINDINGS Anterior circulation: Petrous segments widely patent. Cavernous and supraclinoid segments patent without high-grade stenosis. ICA termini patent. A1 segments patent. Anterior communicating  artery patent and normal. Anterior cerebral arteries patent to their distal aspects without high-grade stenosis. Left M1 segment patent without stenosis. No proximal left M2 occlusion. Distal left MCA branches well perfused. Right M1 segment patent to its distal aspect. There is abrupt occlusion of the distal right M1 segment at the base of the sylvian fissure (series 13, image 99). This appears to be subocclusive with flow seen distally (series 13, image 94). No other proximal or large vessel occlusion. Asymmetric prominence and flow within the distal right MCA branches with left meningeal enhancement as compared to the left cerebral hemisphere, suspected to be related to collateral flow. Right MCA branches otherwise perfused. Posterior circulation: Right vertebral artery patent to the vertebrobasilar junction without high-grade stenosis. Hypoplastic left vertebral artery largely terminates in PICA. Basilar artery diminutive but patent to its distal aspect. Superior cerebral arteries patent bilaterally. Fetal type origin of the PCAs bilaterally. PCAs irregular but patent to their distal aspects. There is diffuse irregularity throughout the intracranial circulation. While this finding may in part be atherosclerotic in nature, the diffuse nature suggest possible vasculopathy. Venous sinuses: Patent. Anatomic variants: Fetal type origin  of the PCAs with diminutive vertebrobasilar system. No aneurysm. Delayed phase: Not performed. Review of the MIP images confirms the above findings CT Brain Perfusion Findings: CBF (<30%) Volume: 0mL Perfusion (Tmax>6.0s) volume: 89mL Mismatch Volume: 89mL Infarction Location:No core infarct by CT perfusion. Ischemic penumbra throughout the mid and posterior right MCA territory, primarily involving the right frontotemporal region. IMPRESSION: 1. Positive CTA for emergent large vessel occlusion involving distal right M1/proximal M2 segment. Flow seen distal to the site of occlusion,  suggesting either subocclusive thrombus or possibly recanalization. Superimposed vasospasm may be contributory as well. Aspects equals 8. 2. Ischemic penumbra throughout the mid and posterior right MCA territory, primarily involving the right frontotemporal region. No core infarct identified by perfusion imaging. 3. Asymmetric leptomeningeal enhancement within the right MCA territory, likely related to collateralization. 4. Diffuse irregularity throughout the intracranial arterial circulation. While this finding may in part be atherosclerotic in nature, an underlying vasculopathy could also be considered. 5. Emphysema. Critical Value/emergent results were called by telephone at the time of interpretation on 09/28/2017 at 3:37 am to Dr. Milon Dikes , who verbally acknowledged these results. Electronically Signed   By: Rise Mu M.D.   On: 09/28/2017 04:13   Ct Head Wo Contrast  Result Date: 09/28/2017 CLINICAL DATA:  Initial evaluation for acute left-sided weakness, falls. EXAM: CT ANGIOGRAPHY HEAD AND NECK CT PERFUSION BRAIN TECHNIQUE: Multidetector CT imaging of the head and neck was performed using the standard protocol during bolus administration of intravenous contrast. Multiplanar CT image reconstructions and MIPs were obtained to evaluate the vascular anatomy. Carotid stenosis measurements (when applicable) are obtained utilizing NASCET criteria, using the distal internal carotid diameter as the denominator. Multiphase CT imaging of the brain was performed following IV bolus contrast injection. Subsequent parametric perfusion maps were calculated using RAPID software. CONTRAST:  ISOVUE-370 IOPAMIDOL (ISOVUE-370) INJECTION 76% COMPARISON:  None. FINDINGS: CT HEAD FINDINGS Brain: Examination mildly limited by patient positioning. No acute intracranial hemorrhage. Abnormal hypodensity seen involving the right insular cortex, concerning for evolving right MCA territory infarct (series 3, image  19). Possible extension into the adjacent into cortex lateral to the insular ribbon. Basal ganglia and internal capsule are spared at this time. No other acute large vessel territory infarct. No mass lesion or midline shift. No hydrocephalus. No extra-axial fluid collection. Vascular: Abnormal hyperdensity within distal right M1 and/or M2 branch at the base of the sylvian fissure (series 3, image 17), concerning for thrombus. Skull: Scalp soft tissues and calvarium within normal limits. 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 Mild right sphenoid sinus opacification. Paranasal sinuses are otherwise clear. Mastoids are clear. No acute abnormality about the globes or orbits. Defect noted at the posterior left globe. Other: None. ASPECTS (Alberta Stroke Program Early CT Score) - Ganglionic level infarction (caudate, lentiform nuclei, internal capsule, insula, M1-M3 cortex): 5 - Supraganglionic infarction (M4-M6 cortex): 3 Total score (0-10 with 10 being normal): 8 Review of the MIP images confirms the above findings CTA NECK FINDINGS Aortic arch: Visualized aortic arch of normal caliber with normal branch pattern. No flow-limiting stenosis about the origin of the great vessels. Visualized subclavian arteries patent. Right carotid system: Right common and internal carotid arteries are widely patent without stenosis, dissection, or occlusion. No significant atheromatous narrowing about the right carotid bifurcation. Left carotid system: Left common and internal carotid arteries are widely patent without stenosis, dissection, or occlusion. No significant atheromatous narrowing about the left carotid bifurcation. Vertebral arteries: Left vertebral artery diffusely  hypoplastic and arises from the aortic arch. Vertebral arteries widely patent within the neck without stenosis, dissection, or occlusion. Skeleton: No acute osseous abnormality. No worrisome lytic or blastic  osseous lesions. Other neck: No acute soft tissue abnormality within the neck. No adenopathy. Salivary glands within normal limits. Thyroid within normal limits. Upper chest: Visualized upper chest demonstrates no acute abnormality. Emphysema. Visualized lungs are otherwise clear. Review of the MIP images confirms the above findings CTA HEAD FINDINGS Anterior circulation: Petrous segments widely patent. Cavernous and supraclinoid segments patent without high-grade stenosis. ICA termini patent. A1 segments patent. Anterior communicating artery patent and normal. Anterior cerebral arteries patent to their distal aspects without high-grade stenosis. Left M1 segment patent without stenosis. No proximal left M2 occlusion. Distal left MCA branches well perfused. Right M1 segment patent to its distal aspect. There is abrupt occlusion of the distal right M1 segment at the base of the sylvian fissure (series 13, image 99). This appears to be subocclusive with flow seen distally (series 13, image 94). No other proximal or large vessel occlusion. Asymmetric prominence and flow within the distal right MCA branches with left meningeal enhancement as compared to the left cerebral hemisphere, suspected to be related to collateral flow. Right MCA branches otherwise perfused. Posterior circulation: Right vertebral artery patent to the vertebrobasilar junction without high-grade stenosis. Hypoplastic left vertebral artery largely terminates in PICA. Basilar artery diminutive but patent to its distal aspect. Superior cerebral arteries patent bilaterally. Fetal type origin of the PCAs bilaterally. PCAs irregular but patent to their distal aspects. There is diffuse irregularity throughout the intracranial circulation. While this finding may in part be atherosclerotic in nature, the diffuse nature suggest possible vasculopathy. Venous sinuses: Patent. Anatomic variants: Fetal type origin of the PCAs with diminutive vertebrobasilar  system. No aneurysm. Delayed phase: Not performed. Review of the MIP images confirms the above findings CT Brain Perfusion Findings: CBF (<30%) Volume: 71mL Perfusion (Tmax>6.0s) volume: 18mL Mismatch Volume: 44mL Infarction Location:No core infarct by CT perfusion. Ischemic penumbra throughout the mid and posterior right MCA territory, primarily involving the right frontotemporal region. IMPRESSION: 1. Positive CTA for emergent large vessel occlusion involving distal right M1/proximal M2 segment. Flow seen distal to the site of occlusion, suggesting either subocclusive thrombus or possibly recanalization. Superimposed vasospasm may be contributory as well. Aspects equals 8. 2. Ischemic penumbra throughout the mid and posterior right MCA territory, primarily involving the right frontotemporal region. No core infarct identified by perfusion imaging. 3. Asymmetric leptomeningeal enhancement within the right MCA territory, likely related to collateralization. 4. Diffuse irregularity throughout the intracranial arterial circulation. While this finding may in part be atherosclerotic in nature, an underlying vasculopathy could also be considered. 5. Emphysema. Critical Value/emergent results were called by telephone at the time of interpretation on 09/28/2017 at 3:37 am to Dr. Milon Dikes , who verbally acknowledged these results. Electronically Signed   By: Rise Mu M.D.   On: 09/28/2017 04:13   Ct Angio Neck W And/or Wo Contrast  Result Date: 09/28/2017 CLINICAL DATA:  Initial evaluation for acute left-sided weakness, falls. EXAM: CT ANGIOGRAPHY HEAD AND NECK CT PERFUSION BRAIN TECHNIQUE: Multidetector CT imaging of the head and neck was performed using the standard protocol during bolus administration of intravenous contrast. Multiplanar CT image reconstructions and MIPs were obtained to evaluate the vascular anatomy. Carotid stenosis measurements (when applicable) are obtained utilizing NASCET criteria,  using the distal internal carotid diameter as the denominator. Multiphase CT imaging of the brain was performed following IV  bolus contrast injection. Subsequent parametric perfusion maps were calculated using RAPID software. CONTRAST:  ISOVUE-370 IOPAMIDOL (ISOVUE-370) INJECTION 76% COMPARISON:  None. FINDINGS: CT HEAD FINDINGS Brain: Examination mildly limited by patient positioning. No acute intracranial hemorrhage. Abnormal hypodensity seen involving the right insular cortex, concerning for evolving right MCA territory infarct (series 3, image 19). Possible extension into the adjacent into cortex lateral to the insular ribbon. Basal ganglia and internal capsule are spared at this time. No other acute large vessel territory infarct. No mass lesion or midline shift. No hydrocephalus. No extra-axial fluid collection. Vascular: Abnormal hyperdensity within distal right M1 and/or M2 branch at the base of the sylvian fissure (series 3, image 17), concerning for thrombus. Skull: Scalp soft tissues and calvarium within normal limits. 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 Mild right sphenoid sinus opacification. Paranasal sinuses are otherwise clear. Mastoids are clear. No acute abnormality about the globes or orbits. Defect noted at the posterior left globe. Other: None. ASPECTS ([place] Stroke Program Early CT Score) - Ganglionic level infarction (caudate, lentiform nuclei, internal capsule, insula, M1-M3 cortex): 5 - Supraganglionic infarction (M4-M6 cortex): 3 Total score (0-10 with 10 being normal): 8 Review of the MIP images confirms the above findings CTA NECK FINDINGS Aortic arch: Visualized aortic arch of normal caliber with normal branch pattern. No flow-limiting stenosis about the origin of the great vessels. Visualized subclavian arteries patent. Right carotid system: Right common and internal carotid arteries are widely patent without stenosis,  dissection, or occlusion. No significant atheromatous narrowing about the right carotid bifurcation. Left carotid system: Left common and internal carotid arteries are widely patent without stenosis, dissection, or occlusion. No significant atheromatous narrowing about the left carotid bifurcation. Vertebral arteries: Left vertebral artery diffusely hypoplastic and arises from the aortic arch. Vertebral arteries widely patent within the neck without stenosis, dissection, or occlusion. Skeleton: No acute osseous abnormality. No worrisome lytic or blastic osseous lesions. Other neck: No acute soft tissue abnormality within the neck. No adenopathy. Salivary glands within normal limits. Thyroid within normal limits. Upper chest: Visualized upper chest demonstrates no acute abnormality. Emphysema. Visualized lungs are otherwise clear. Review of the MIP images confirms the above findings CTA HEAD FINDINGS Anterior circulation: Petrous segments widely patent. Cavernous and supraclinoid segments patent without high-grade stenosis. ICA termini patent. A1 segments patent. Anterior communicating artery patent and normal. Anterior cerebral arteries patent to their distal aspects without high-grade stenosis. Left M1 segment patent without stenosis. No proximal left M2 occlusion. Distal left MCA branches well perfused. Right M1 segment patent to its distal aspect. There is abrupt occlusion of the distal right M1 segment at the base of the sylvian fissure (series 13, image 99). This appears to be subocclusive with flow seen distally (series 13, image 94). No other proximal or large vessel occlusion. Asymmetric prominence and flow within the distal right MCA branches with left meningeal enhancement as compared to the left cerebral hemisphere, suspected to be related to collateral flow. Right MCA branches otherwise perfused. Posterior circulation: Right vertebral artery patent to the vertebrobasilar junction without high-grade  stenosis. Hypoplastic left vertebral artery largely terminates in PICA. Basilar artery diminutive but patent to its distal aspect. Superior cerebral arteries patent bilaterally. Fetal type origin of the PCAs bilaterally. PCAs irregular but patent to their distal aspects. There is diffuse irregularity throughout the intracranial circulation. While this finding may in part be atherosclerotic in nature, the diffuse nature suggest possible vasculopathy. Venous sinuses: Patent. Anatomic variants: Fetal type origin  of the PCAs with diminutive vertebrobasilar system. No aneurysm. Delayed phase: Not performed. Review of the MIP images confirms the above findings CT Brain Perfusion Findings: CBF (<30%) Volume: 0mL Perfusion (Tmax>6.0s) volume: 89mL Mismatch Volume: 89mL Infarction Location:No core infarct by CT perfusion. Ischemic penumbra throughout the mid and posterior right MCA territory, primarily involving the right frontotemporal region. IMPRESSION: 1. Positive CTA for emergent large vessel occlusion involving distal right M1/proximal M2 segment. Flow seen distal to the site of occlusion, suggesting either subocclusive thrombus or possibly recanalization. Superimposed vasospasm may be contributory as well. Aspects equals 8. 2. Ischemic penumbra throughout the mid and posterior right MCA territory, primarily involving the right frontotemporal region. No core infarct identified by perfusion imaging. 3. Asymmetric leptomeningeal enhancement within the right MCA territory, likely related to collateralization. 4. Diffuse irregularity throughout the intracranial arterial circulation. While this finding may in part be atherosclerotic in nature, an underlying vasculopathy could also be considered. 5. Emphysema. Critical Value/emergent results were called by telephone at the time of interpretation on 09/28/2017 at 3:37 am to Dr. Milon Dikes , who verbally acknowledged these results. Electronically Signed   By: Rise Mu M.D.   On: 09/28/2017 04:13   Ct Cerebral Perfusion W Contrast  Result Date: 09/28/2017 CLINICAL DATA:  Initial evaluation for acute left-sided weakness, falls. EXAM: CT ANGIOGRAPHY HEAD AND NECK CT PERFUSION BRAIN TECHNIQUE: Multidetector CT imaging of the head and neck was performed using the standard protocol during bolus administration of intravenous contrast. Multiplanar CT image reconstructions and MIPs were obtained to evaluate the vascular anatomy. Carotid stenosis measurements (when applicable) are obtained utilizing NASCET criteria, using the distal internal carotid diameter as the denominator. Multiphase CT imaging of the brain was performed following IV bolus contrast injection. Subsequent parametric perfusion maps were calculated using RAPID software. CONTRAST:  ISOVUE-370 IOPAMIDOL (ISOVUE-370) INJECTION 76% COMPARISON:  None. FINDINGS: CT HEAD FINDINGS Brain: Examination mildly limited by patient positioning. No acute intracranial hemorrhage. Abnormal hypodensity seen involving the right insular cortex, concerning for evolving right MCA territory infarct (series 3, image 19). Possible extension into the adjacent into cortex lateral to the insular ribbon. Basal ganglia and internal capsule are spared at this time. No other acute large vessel territory infarct. No mass lesion or midline shift. No hydrocephalus. No extra-axial fluid collection. Vascular: Abnormal hyperdensity within distal right M1 and/or M2 branch at the base of the sylvian fissure (series 3, image 17), concerning for thrombus. Skull: Scalp soft tissues and calvarium within normal limits. 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 Mild right sphenoid sinus opacification. Paranasal sinuses are otherwise clear. Mastoids are clear. No acute abnormality about the globes or orbits. Defect noted at the posterior left globe. Other: None. ASPECTS (Alberta Stroke Program  Early CT Score) - Ganglionic level infarction (caudate, lentiform nuclei, internal capsule, insula, M1-M3 cortex): 5 - Supraganglionic infarction (M4-M6 cortex): 3 Total score (0-10 with 10 being normal): 8 Review of the MIP images confirms the above findings CTA NECK FINDINGS Aortic arch: Visualized aortic arch of normal caliber with normal branch pattern. No flow-limiting stenosis about the origin of the great vessels. Visualized subclavian arteries patent. Right carotid system: Right common and internal carotid arteries are widely patent without stenosis, dissection, or occlusion. No significant atheromatous narrowing about the right carotid bifurcation. Left carotid system: Left common and internal carotid arteries are widely patent without stenosis, dissection, or occlusion. No significant atheromatous narrowing about the left carotid bifurcation. Vertebral arteries: Left vertebral artery  diffusely hypoplastic and arises from the aortic arch. Vertebral arteries widely patent within the neck without stenosis, dissection, or occlusion. Skeleton: No acute osseous abnormality. No worrisome lytic or blastic osseous lesions. Other neck: No acute soft tissue abnormality within the neck. No adenopathy. Salivary glands within normal limits. Thyroid within normal limits. Upper chest: Visualized upper chest demonstrates no acute abnormality. Emphysema. Visualized lungs are otherwise clear. Review of the MIP images confirms the above findings CTA HEAD FINDINGS Anterior circulation: Petrous segments widely patent. Cavernous and supraclinoid segments patent without high-grade stenosis. ICA termini patent. A1 segments patent. Anterior communicating artery patent and normal. Anterior cerebral arteries patent to their distal aspects without high-grade stenosis. Left M1 segment patent without stenosis. No proximal left M2 occlusion. Distal left MCA branches well perfused. Right M1 segment patent to its distal aspect. There is  abrupt occlusion of the distal right M1 segment at the base of the sylvian fissure (series 13, image 99). This appears to be subocclusive with flow seen distally (series 13, image 94). No other proximal or large vessel occlusion. Asymmetric prominence and flow within the distal right MCA branches with left meningeal enhancement as compared to the left cerebral hemisphere, suspected to be related to collateral flow. Right MCA branches otherwise perfused. Posterior circulation: Right vertebral artery patent to the vertebrobasilar junction without high-grade stenosis. Hypoplastic left vertebral artery largely terminates in PICA. Basilar artery diminutive but patent to its distal aspect. Superior cerebral arteries patent bilaterally. Fetal type origin of the PCAs bilaterally. PCAs irregular but patent to their distal aspects. There is diffuse irregularity throughout the intracranial circulation. While this finding may in part be atherosclerotic in nature, the diffuse nature suggest possible vasculopathy. Venous sinuses: Patent. Anatomic variants: Fetal type origin of the PCAs with diminutive vertebrobasilar system. No aneurysm. Delayed phase: Not performed. Review of the MIP images confirms the above findings CT Brain Perfusion Findings: CBF (<30%) Volume: 0mL Perfusion (Tmax>6.0s) volume: 89mL Mismatch Volume: 89mL Infarction Location:No core infarct by CT perfusion. Ischemic penumbra throughout the mid and posterior right MCA territory, primarily involving the right frontotemporal region. IMPRESSION: 1. Positive CTA for emergent large vessel occlusion involving distal right M1/proximal M2 segment. Flow seen distal to the site of occlusion, suggesting either subocclusive thrombus or possibly recanalization. Superimposed vasospasm may be contributory as well. Aspects equals 8. 2. Ischemic penumbra throughout the mid and posterior right MCA territory, primarily involving the right frontotemporal region. No core infarct  identified by perfusion imaging. 3. Asymmetric leptomeningeal enhancement within the right MCA territory, likely related to collateralization. 4. Diffuse irregularity throughout the intracranial arterial circulation. While this finding may in part be atherosclerotic in nature, an underlying vasculopathy could also be considered. 5. Emphysema. Critical Value/emergent results were called by telephone at the time of interpretation on 09/28/2017 at 3:37 am to Dr. Milon Dikes , who verbally acknowledged these results. Electronically Signed   By: Rise Mu M.D.   On: 09/28/2017 04:13   Dg Chest Port 1 View  Result Date: 09/28/2017 CLINICAL DATA:  Acute onset of shortness of breath. EXAM: PORTABLE CHEST 1 VIEW COMPARISON:  Chest radiograph performed 09/20/2017, and CTA of the chest performed 09/21/2017 FINDINGS: The lungs are well-aerated and clear. There is no evidence of focal opacification, pleural effusion or pneumothorax. The cardiomediastinal silhouette is mildly enlarged. No acute osseous abnormalities are seen. IMPRESSION: Mild cardiomegaly.  Lungs remain grossly clear. Electronically Signed   By: Roanna Raider M.D.   On: 09/28/2017 02:24   SIGNIFICANT EVENTS: 2/7:  presented to ER with frequent falls and SOB, initially on BIPAP (now off) for work of breathing, Found to have acute R-MCA CVA  LINES/TUBES: PIV's Foley catheter 2/7 >>  DISCUSSION: 41yoM with hx Asthma, CHF (EF 20%), CKD, Cocaine and Opiate abuse, who presented to the ER via EMS for complaints of frequent falls and SOB at home. On arrival to the ER patient with tachypnea and increased work of breathing, for which he was placed on BIPAP (now off). He was found to have flaccid LUE and CTA Head revealed acute R-MCA CVA although not clear if embolic with collaterals vs vasospasm.   ASSESSMENT / PLAN:  PULMONARY 1. Respiratory distress (resolved); hx Asthma: - initially with tachypnea and increased work of breathing and some  wheezes per ED MD, for which was placed on BIPAP. Now off BIPAP. Lungs CTA b/l with no crackles or wheezes. No respiratory distress or increased work of breathing at this time.  - VBG showed only very very mild acute-on-chronic hypercapnea (pH 7.35 / pCO2 51 / HCO3 29) - CXR on my review shows no infiltrates - Continue home medications Duonebs and Qvar  CARDIOVASCULAR 1. Hx CHF: - no signs of fluid overload clinically on my exam or on CXR  2. NSTEMI (likely a type 2 NSTEMI from demand ischemia): - troponin 0.36; patient asymptomatic. EKG ordered  RENAL 1. CKD:  - creatinine 1.5 from baseline around 1.45 - avoid nephrotoxic agents; monitor UOP  GASTROINTESTINAL No active issues; NPO; Needs speech eval prior to feeding  HEMATOLOGIC No active issues  INFECTIOUS No active issues   ENDOCRINE No active issues   NEUROLOGIC 1. Acute R-MCA Ischemic CVA; Cocaine abuse: - it is unclear on CTA Head if there is a thrombus with collateral circulation or if this is vasospasm due to cocaine use; Neurology consulted and examined patient and reviewed case with Neuro intervention who agreed due to CTA findings being inconclusive for thrombus as well as due to the unclear last known normal (last seen 5:30pm per EMS who spoke to familiy), patient is not a candidate for IR thrombectomy at this time - MRI brain and TTE ordered - keep SBP <220 - UDS pending - rest per neurology - patient is stable for admission to hospitalist service and does not need ICU at this time. ER MD alerted and plans to consult Hospitalist.   FAMILY  - Updates: no family present in ER - Inter-disciplinary family meet or Palliative Care meeting due by: 10/05/17  60 minutes critical care time  Milana Obey, MD  Pulmonary and Critical Care Medicine [facility] Pager: [phone]  09/28/2017, 6:11 AM

## 2017-09-28 NOTE — ED Notes (Signed)
Patient transported to MRI 

## 2017-09-28 NOTE — ED Notes (Signed)
Pt c/o wanting foley out-- pt in hallway-- told patient that when he gets in a room it can be taken out

## 2017-09-28 NOTE — Evaluation (Signed)
Clinical/Bedside Swallow Evaluation Patient Details  Name: Mark [name] MRN: [id number] Date of Birth: 04-21-1976  Today's Date: 09/28/2017 Time: SLP Start Time (ACUTE ONLY): 1332 SLP Stop Time (ACUTE ONLY): 1344 SLP Time Calculation (min) (ACUTE ONLY): 12 min  Past Medical History:  Past Medical History:  Diagnosis Date  . Asthma   . Chronic systolic CHF (congestive heart failure) (HCC)   . Cigarette smoker   . History of echocardiogram    a. Echo 5/17 - EF 20-25%, severe diff HK, restrictive physiology, mild to mod MR, severe reduced RVSF, mod RVE, mild RAE, mod TR, PASP 48 mmHg  . Hx of cardiac cath    a. LHC 5/17 - normal coronary arteries. PA 45/25, mean 33, PCWP mean 18  . NICM (nonischemic cardiomyopathy) (HCC)   . Substance abuse (HCC)    cocaine, marijuana   Past Surgical History:  Past Surgical History:  Procedure Laterality Date  . CARDIAC CATHETERIZATION N/A 01/05/2016   Procedure: Right/Left Heart Cath and Coronary Angiography;  Surgeon: Lennette Bihari, MD;  Location: [facility] INVASIVE CV [facility];  Service: Cardiovascular;  Laterality: N/A;  . none     HPI:  Timber C [name] a 42 y.o.malewho has apastmedical history of cocaine and marijuana abuse, chronic systolic CHF (non ischemic cardiomyopathy), tobacco abuse, asthma, who presented to the emergency room for evaluation of multiple falls and shortness of breath. Unknown last know normal. Pt reports inconsistent left sided weakness for over a year. MRI (2/7) revealed a moderate to large right MCA distribution infarct as described. There is associated slow/absent flow within the associated right M2 branch. CXR (2/7) Lungs remain grossly clear. Pt's swallow stroke screen failed secondary to inability to cough on demand.   Assessment / Plan / Recommendation Clinical Impression    Pt presents with Mod left facial/lingual asymmetry and reduced left facial sensation/strength, indicated by anterior spillage and lingual residue  during PO trials. Pt benefited from Mod verbal/visual cues to raise awareness to spillage/residue. Congested, productive cough was observed during session. Pt had significant cough response and throat clear following two large sips of thin liquid, indicating possible airway compromise from premature spillage. Pt consumed solid trials without overt signs of aspiration but remains at risk for residue given sensorimotor deficits. Pt scheduled for MBS this afternoon to objectively assess swallow function and determine diet recommendations.   SLP Visit Diagnosis: Dysphagia, oropharyngeal phase (R13.12)    Aspiration Risk  Moderate aspiration risk    Diet Recommendation NPO   Medication Administration: Via alternative means    Other  Recommendations Oral Care Recommendations: Oral care QID   Follow up Recommendations (tba)      Frequency and Duration            Prognosis Prognosis for Safe Diet Advancement: Good      Swallow Study   General HPI: Mark [name] a 42 y.o.malewho has apastmedical history of cocaine and marijuana abuse, chronic systolic CHF (non ischemic cardiomyopathy), tobacco abuse, asthma, who presented to the emergency room for evaluation of multiple falls and shortness of breath. Unknown last know normal. Pt reports inconsistent left sided weakness for over a year. MRI (2/7) revealed a moderate to large right MCA distribution infarct as described. There is associated slow/absent flow within the associated right M2 branch. CXR (2/7) Lungs remain grossly clear. Pt's swallow stroke screen failed secondary to inability to cough on demand. Type of Study: Bedside Swallow Evaluation Previous Swallow Assessment: none in chart Diet Prior to this Study:  NPO Temperature Spikes Noted: No Respiratory Status: Room air History of Recent Intubation: No Behavior/Cognition: Alert;Cooperative Oral Cavity Assessment: Within Functional Limits Oral Care Completed by SLP: No Oral  Cavity - Dentition: Adequate natural dentition Vision: Functional for self-feeding Self-Feeding Abilities: Needs assist Patient Positioning: Upright in bed Baseline Vocal Quality: Normal Volitional Cough: Strong Volitional Swallow: Able to elicit    Oral/Motor/Sensory Function Overall Oral Motor/Sensory Function: Moderate impairment Facial ROM: Reduced left Facial Symmetry: Abnormal symmetry left;Suspected CN VII (facial) dysfunction Facial Sensation: Reduced left;Suspected CN V (Trigeminal) dysfunction Lingual ROM: Reduced left;Suspected CN XII (hypoglossal) dysfunction Lingual Symmetry: Abnormal symmetry left;Suspected CN XII (hypoglossal) dysfunction Lingual Strength: Reduced;Suspected CN XII (hypoglossal) dysfunction Lingual Sensation: Reduced;Suspected CN VII (facial) dysfunction-anterior 2/3 tongue   Ice Chips Ice chips: Within functional limits Presentation: Spoon   Thin Liquid Thin Liquid: Impaired Presentation: Cup Oral Phase Functional Implications: Left anterior spillage Pharyngeal  Phase Impairments: Cough - Immediate;Throat Clearing - Immediate    Nectar Thick Nectar Thick Liquid: Not tested   Honey Thick Honey Thick Liquid: Not tested   Puree Puree: Impaired Presentation: Spoon Oral Phase Functional Implications: Left anterior spillage;Oral residue   Solid   GO   [place] Landry Kamath SLP Student Clinician  Solid: Impaired Presentation: Self Fed Oral Phase Functional Implications: Oral residue        [place] Grace Haggart 09/28/2017,2:26 PM

## 2017-09-28 NOTE — Progress Notes (Signed)
  Echocardiogram 2D Echocardiogram has been performed.  Mark [name] 09/28/2017, 12:48 PM

## 2017-09-28 NOTE — ED Notes (Signed)
BIPAP discontinued by Dr. Merlene Pulling , O2  2lpm initiated and tolerated , O2 sat= 98% .

## 2017-09-28 NOTE — Consult Note (Addendum)
Neurology Consultation  Reason for Consult: Left-sided weakness Referring Physician: Dr. Pecola Leisure  CC: Left-sided weakness, shortness of breath  History is obtained from: Chart  HPI: Mark [name] is a 42 y.o. male who has a past medical history of cocaine and marijuana abuse, chronic systolic CHF (non ischemic cardiomyopathy), tobacco abuse, asthma, who presented to the emergency room for evaluation of multiple falls and shortness of breath.  It is unclear when his last known normal was.   He was evaluated in the emergency room by the ED providers and noted to have left-sided weakness.  He says that this weakness is off and on now going on for over a year.   He was in the emergency room a week ago and did not have any focal weakness.  The patient reports that he has off-and-on numbness of the left arm and leg and that makes it difficult for him to move that side. Upon the examination during the early part of this encounter, he was completely flaccid on his left upper extremity but later on was able to move the left upper extremity antigravity with some drift. He is currently on a BiPAP as he had hypercarbic respiratory failure on arrival.  LKW: unclear. Best per history - talked to family at 1700 hrs on 09/27/17 and speech was normal at the time. No clear time of last witnessed normal. Attempted to reach the number for emergency contact on the chart, but unsuccessful. tpa given?: no, OSW Premorbid modified Rankin scale (mRS): 1  ROS: ROS was performed and is negative except as noted in the HPI.   Past Medical History:  Diagnosis Date  . Asthma   . Chronic systolic CHF (congestive heart failure) (HCC)   . Cigarette smoker   . History of echocardiogram    a. Echo 5/17 - EF 20-25%, severe diff HK, restrictive physiology, mild to mod MR, severe reduced RVSF, mod RVE, mild RAE, mod TR, PASP 48 mmHg  . Hx of cardiac cath    a. LHC 5/17 - normal coronary arteries. PA 45/25, mean 33, PCWP mean 18   . NICM (nonischemic cardiomyopathy) (HCC)   . Substance abuse (HCC)    cocaine, marijuana   Family History  Problem Relation Age of Onset  . Cardiomyopathy Father        Reports his father has an LVAD  . Heart failure Father   . Hypertension Father   . Deep vein thrombosis Neg Hx    Social History:   reports that he has been smoking cigarettes.  He has a 10.00 pack-year smoking history. he has never used smokeless tobacco. He reports that he drinks alcohol. He reports that he uses drugs. Drugs: Cocaine and Marijuana.  Medications No current facility-administered medications for this encounter.   Current Outpatient Medications:  .  albuterol (PROVENTIL HFA;VENTOLIN HFA) 108 (90 Base) MCG/ACT inhaler, Inhale 1-2 puffs into the lungs every 6 (six) hours as needed for wheezing or shortness of breath., Disp: 1 Inhaler, Rfl: 4 .  aspirin 81 MG EC tablet, Take 1 tablet (81 mg total) by mouth daily., Disp: 30 tablet, Rfl: 4 .  beclomethasone (QVAR) 80 MCG/ACT inhaler, Inhale 1 puff into the lungs 2 (two) times daily., Disp: 1 Inhaler, Rfl: 4 .  benzonatate (TESSALON) 100 MG capsule, Take 1 capsule (100 mg total) by mouth 3 (three) times daily as needed for cough. 340B Chris, Disp: 30 capsule, Rfl: 0 .  carvedilol (COREG) 3.125 MG tablet, Take 1 tablet (3.125 mg  total) by mouth 2 (two) times daily with a meal., Disp: 60 tablet, Rfl: 4 .  furosemide (LASIX) 40 MG tablet, Take 1 tablet (40 mg total) by mouth daily., Disp: 30 tablet, Rfl: 4 .  guaiFENesin-dextromethorphan (ROBITUSSIN DM) 100-10 MG/5ML syrup, Take 5 mLs by mouth every 4 (four) hours as needed for cough., Disp: 118 mL, Rfl: 0 .  isosorbide-hydrALAZINE (BIDIL) 20-37.5 MG tablet, Take 1 tablet by mouth 3 (three) times daily., Disp: 90 tablet, Rfl: 4  Exam: Current vital signs: BP (!) 133/96 (BP Location: Right Arm)   Pulse (!) 109   Temp 99.6 F (37.6 C) (Oral)   Resp (!) 26   SpO2 100%  Vital signs in last 24 hours: Temp:   [99.6 F (37.6 C)] 99.6 F (37.6 C) (02/07 0157) Pulse Rate:  [54-109] 109 (02/07 0201) Resp:  [26-27] 26 (02/07 0201) BP: (133)/(96) 133/96 (02/07 0157) SpO2:  [100 %] 100 % (02/07 0201) Gen: Drowsy, wakes to loud voice, has a BiPAP on, follows commands. HEENT: [place] AT MMM clear nares/throat CVS: S1S2+, rrr Chest: scattered rales Abd: ND NT BS+ Ext: 1+ pitting edema NEUROLOGICAL EXAM Drowsy, wakes to voice, follows commands. Speech is dysarthric. Poor attention and concentration. Able to say he is in hospital. Unable to reliably name simple objects. Unable to repeat CN: PERRL, seems to have right gaze preference but is able to overcome and look to the left. Does not blink to threat from either direction. Face is symmetric from what I can see inside the BiPAP mask.  Motor: RUE and RLE 5/5. LUE initially flaccid, shortly after that, he was able to lift it antigravity with some vertical drift. Left leg antigravity with mild vertical drift. Did not cooperate for full individual muscle testing. Sensory: symmetrically diminished all over  Coord: did not perform FNF or HKS Gait testing was deferred DTR: unable to elicit biceps, BR or knee jerks. Toes mute bilaterally. NIHSS 1a Level of Conscious.: 1 1b LOC Questions: 1 1c LOC Commands: 0 2 Best Gaze: 1 3 Visual: 1 4 Facial Palsy: 0 5a Motor Arm - left: 1 5b Motor Arm - Right: 0 6a Motor Leg - Left: 1 6b Motor Leg - Right: 0 7 Limb Ataxia: 0 8 Sensory: 0 9 Best Language: 0 10 Dysarthria: 1 11 Extinct. and Inatten.: 0 TOTAL: 7  Labs I have reviewed labs in epic and the results pertinent to this consultation are:  CBC    Component Value Date/Time   WBC 7.9 09/20/2017 1841   RBC 5.04 09/20/2017 1841   HGB 14.3 09/28/2017 0235   HCT 42.0 09/28/2017 0235   PLT 198 09/20/2017 1841   MCV 79.4 09/20/2017 1841   MCH 25.6 (L) 09/20/2017 1841   MCHC 32.3 09/20/2017 1841   RDW 15.0 09/20/2017 1841   LYMPHSABS 2.8 09/09/2017 1327    MONOABS 0.5 09/09/2017 1327   EOSABS 0.0 09/09/2017 1327   BASOSABS 0.0 09/09/2017 1327  CMP     Component Value Date/Time   NA 137 09/28/2017 0235   K 4.4 09/28/2017 0235   CL 99 (L) 09/28/2017 0235   CO2 26 09/22/2017 0257   GLUCOSE 118 (H) 09/28/2017 0235   BUN 24 (H) 09/28/2017 0235   CREATININE 1.50 (H) 09/28/2017 0235   CALCIUM 8.8 (L) 09/22/2017 0257   PROT 6.2 (L) 09/09/2017 1327   ALBUMIN 3.4 (L) 09/09/2017 1327   AST 58 (H) 09/09/2017 1327   ALT 71 (H) 09/09/2017 1327   ALKPHOS 102 09/09/2017  1327   BILITOT 1.1 09/09/2017 1327   GFRNONAA 58 (L) 09/22/2017 0257   GFRAA >60 09/22/2017 0257   Trop 0.36 Echo - EF 20%, diffuse hypokinesis, mild LA dilatation.  Imaging I have reviewed the images obtained: CT-scan of the brain - shows evidence of evolving stroke in the right insula and posterior limb of internal capsule. Dense Rt M1 on the scan. CTA Head/neck shows decreased opacification of a short segment of right M1 followed by complete opacification distally -?vasospasm vs partially occlusive thrombus. CT perfusion shows a penumbra of 89cc with no core, which is somewhat puzzling as it does not completely correlate with the hypodensity seen on non contrast CT.  Assessment:  42 year old man with past medical history of substance abuse, nonischemic cardiomyopathy, tobacco abuse, asthma, dyspnea on exertion and at rest, with an unclear last known normal presenting to the emergency room for evaluation of shortness of breath and multiple falls.  Initial history provided by him regarding his left-sided weakness has been ongoing off and on for a year. He gets numbness on that side and is unable to move it and it spontaneously resolves. He was noted to have an evolving hypodensity in the Right M1 territory with CTA showing decreased opacification of a short segment of right m1 followed by complete opacification - discussed with neuroradiology and IR Dr. Corliss Skains. His clinical  exam is marred by his poor respiratory status. Presence of what looks like a subocclusive right M1 thrombus vs vasospasm as well as bump in troponin along with fluctuating left sided weakness, could point toward this being related to cocaine use for which he has strong past history. I discussed his case with IR Dr Corliss Skains and EDP Dr Madilyn Hook re: utility of doing a cerebral angio, for which he will most definitely need to be intubated. Given his poor cardiac status (latest EF 20%) and current respiratory distress, EDP was hesitant to intubate unless absolutely necessary and it was deemed that he will not be a good candidate for IR at this time due to the current comorbidities as well as no clear time of onset and fluctuating symptoms. His current neurological presentation is consistent with a right MCA stroke, but there is no clear time of onset or last seen normal, which was the rationale for not paging out code stroke, and also makes him ineligible for IV-tPA.   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 -MRI brain when stable and able to assess for actual extent of the stroke. -Echocardiogram to assess cardiac function as well as to evaluate for a LV thrombus/LA thrombus. -U tox -Serum ethanol level -A1c, Lipid panel -Telemetry -Allow for permissive hypertension not treating SBP unless it is >220. -Frequent neurochecks -Aspirin 325mg  PO or 300 PR and atorvastatin 80mg  Stroke team will follow with you  Please page stroke NP/PA/MD (listed on AMION)  from 8am-4 pm as this patient will be followed by the stroke team at this point.  -- Milon Dikes, MD [facility]uro[facility] Pager: [phone] If 7pm to 7am, please call on call as listed on AMION.  CRITICAL CARE ATTESTATION This patient is critically ill and at significant risk of neurological worsening, death and care requires constant monitoring of vital signs, hemodynamics,respiratory and  cardiac monitoring. I spent 60  minutes of neurocritical care time performing neurological assessment, discussion with family, other specialists and medical decision making of high complexityin the care of  this patient.

## 2017-09-29 ENCOUNTER — Encounter (HOSPITAL_COMMUNITY): Payer: Self-pay | Admitting: Physical Medicine and Rehabilitation

## 2017-09-29 ENCOUNTER — Other Ambulatory Visit: Payer: Self-pay

## 2017-09-29 DIAGNOSIS — I5021 Acute systolic (congestive) heart failure: Secondary | ICD-10-CM

## 2017-09-29 DIAGNOSIS — I428 Other cardiomyopathies: Secondary | ICD-10-CM

## 2017-09-29 DIAGNOSIS — N182 Chronic kidney disease, stage 2 (mild): Secondary | ICD-10-CM

## 2017-09-29 DIAGNOSIS — R748 Abnormal levels of other serum enzymes: Secondary | ICD-10-CM

## 2017-09-29 DIAGNOSIS — I639 Cerebral infarction, unspecified: Secondary | ICD-10-CM

## 2017-09-29 DIAGNOSIS — Z720 Tobacco use: Secondary | ICD-10-CM

## 2017-09-29 LAB — MRSA PCR SCREENING: MRSA by PCR: POSITIVE — AB

## 2017-09-29 MED ORDER — CHLORHEXIDINE GLUCONATE CLOTH 2 % EX PADS
6.0000 | MEDICATED_PAD | Freq: Every day | CUTANEOUS | Status: DC
  Administered 2017-09-30 – 2017-10-01 (×2): 6 via TOPICAL

## 2017-09-29 MED ORDER — MUPIROCIN 2 % EX OINT
1.0000 "application " | TOPICAL_OINTMENT | Freq: Two times a day (BID) | CUTANEOUS | Status: DC
  Administered 2017-09-29 – 2017-10-01 (×5): 1 via NASAL
  Filled 2017-09-29: qty 22

## 2017-09-29 NOTE — Evaluation (Signed)
`Physical Therapy Evaluation Patient Details Name: Mark [name] MRN: [id number] DOB: [birth year] Today's Date: 09/29/2017   History of Present Illness  Pt is a 42 y.o. male admitted 09/28/17 for evaluation of multiple falls and SOB; found to have L-sided weakness. MRI showed moderate to large R MCA distribution infarct. Outside window for tpa. Pt also with dysphagia. PMH includes polysubstance abuse, HTN, CHF, asthma.      Clinical Impression  Pt presents with L-side inattention, decreased safety awareness, slowed processing, and an overall decrease in functional mobility secondary to above. PTA, pt independent, works, and lives with fiance. Today, pt demonstrating L-side weakness and poor postural reactions, requiring intermittent minA to correct balance. Poor insight into deficits, requiring cues to attend to L-side throughout session. Pt at significant risk for falls. Feel pt would benefit from intensive CIR-level therapies in order to maximize functional mobility and return to independent lifestyle. Will follow acutely to address established goals.    Follow Up Recommendations CIR;Supervision/Assistance - 24 hour    Equipment Recommendations  (TBD next venue)    Recommendations for Other Services Rehab consult     Precautions / Restrictions Precautions Precautions: Fall Precaution Comments: L-side inattention Restrictions Weight Bearing Restrictions: No      Mobility  Bed Mobility               General bed mobility comments: Received sitting EOB  Transfers Overall transfer level: Needs assistance Equipment used: 1 person hand held assist Transfers: Sit to/from Stand Sit to Stand: Min assist;+2 safety/equipment         General transfer comment: MinA for balance upon standing. Decreased awareness of L-side  Ambulation/Gait Ambulation/Gait assistance: Min assist;+2 safety/equipment Ambulation Distance (Feet): 150 Feet Assistive device: 1 person hand held  assist Gait Pattern/deviations: Step-through pattern;Decreased stride length;Staggering left;Drifts right/left;Narrow base of support Gait velocity: Decreased Gait velocity interpretation: <1.8 ft/sec, indicative of risk for recurrent falls General Gait Details: Slow, very unsteady amb with intermittent minA for balance. Pt drifting towards L-side, running therapist into wall without realizing. Decreased ability to follow multi-step commands. Demonstrating no awareness of deficits  Stairs            Wheelchair Mobility    Modified Rankin (Stroke Patients Only)       Balance Overall balance assessment: Needs assistance   Sitting balance-Leahy Scale: Fair       Standing balance-Leahy Scale: Fair Standing balance comment: Cannot tolerate challenge without assist to correct                             Pertinent Vitals/Pain Pain Assessment: No/denies pain    Home Living Family/patient expects to be discharged to:: Private residence Living Arrangements: Spouse/significant other(Fiance) Available Help at Discharge: Family;Friend(s);Available PRN/intermittently Type of Home: House Home Access: Stairs to enter     Home Layout: One level Home Equipment: None Additional Comments: Lives with fiance who works    Prior Function Level of Independence: Independent         Comments: Works at [employer]        Extremity/Trunk Assessment   Upper Extremity Assessment Upper Extremity Assessment: LUE deficits/detail LUE Deficits / Details: Decreased coordination with finger-to-nose LUE Sensation: decreased light touch LUE Coordination: decreased fine motor;decreased gross motor    Lower Extremity Assessment Lower Extremity Assessment: LLE deficits/detail LLE Deficits / Details: L hip 3/5, L knee flex/ext 4/5, L ankle DF/PF 4/5 LLE Coordination:  decreased fine motor       Communication   Communication: No difficulties  Cognition  Arousal/Alertness: Awake/alert Behavior During Therapy: Flat affect Overall Cognitive Status: No family/caregiver present to determine baseline cognitive functioning Area of Impairment: Attention;Following commands;Safety/judgement;Awareness;Problem solving                   Current Attention Level: Selective   Following Commands: Follows multi-step commands inconsistently Safety/Judgement: Decreased awareness of safety;Decreased awareness of deficits Awareness: Emergent Problem Solving: Slow processing;Decreased initiation;Requires verbal cues;Requires tactile cues General Comments: Pt demonstrating L-side neglect throughout session, requiring cues to attend to L-side; also, delay noted when moving L-side (UE>LE). Pt upset regarding dysphagia 2 diet, perseverating on this during session. Decreased attention and easily distracted      General Comments      Exercises     Assessment/Plan    PT Assessment Patient needs continued PT services  PT Problem List Decreased strength;Decreased activity tolerance;Decreased balance;Decreased mobility;Decreased coordination;Decreased cognition;Decreased knowledge of use of DME;Decreased safety awareness;Impaired sensation       PT Treatment Interventions DME instruction;Gait training;Stair training;Functional mobility training;Therapeutic activities;Therapeutic exercise;Balance training;Cognitive remediation;Patient/family education;Neuromuscular re-education    PT Goals (Current goals can be found in the Care Plan section)  Acute Rehab PT Goals Patient Stated Goal: Return home PT Goal Formulation: With patient Time For Goal Achievement: 10/13/17 Potential to Achieve Goals: Fair    Frequency Min 4X/week   Barriers to discharge        Co-evaluation PT/OT/SLP Co-Evaluation/Treatment: Yes Reason for Co-Treatment: Necessary to address cognition/behavior during functional activity;For patient/therapist safety;Complexity of the  patient's impairments (multi-system involvement);To address functional/ADL transfers PT goals addressed during session: Mobility/safety with mobility;Balance         AM-PAC PT "6 Clicks" Daily Activity  Outcome Measure Difficulty turning over in bed (including adjusting bedclothes, sheets and blankets)?: A Little Difficulty moving from lying on back to sitting on the side of the bed? : A Little Difficulty sitting down on and standing up from a chair with arms (e.g., wheelchair, bedside commode, etc,.)?: Unable Help needed moving to and from a bed to chair (including a wheelchair)?: A Little Help needed walking in hospital room?: A Little Help needed climbing 3-5 steps with a railing? : A Lot 6 Click Score: 15    End of Session Equipment Utilized During Treatment: Gait belt Activity Tolerance: Patient tolerated treatment well Patient left: in chair;with call bell/phone within reach;Other (comment);with chair alarm set(with SLP) Nurse Communication: Mobility status PT Visit Diagnosis: Other abnormalities of gait and mobility (R26.89);Other symptoms and signs involving the nervous system (W09.811)    Time: 9147-8295 PT Time Calculation (min) (ACUTE ONLY): 32 min   Charges:   PT Evaluation $PT Eval Moderate Complexity: 1 Mod     PT G Codes:       Ina Homes, PT, DPT Acute Rehab Services  Pager: [phone]  Malachy Chamber 09/29/2017, 10:00 AM

## 2017-09-29 NOTE — Progress Notes (Signed)
NEUROHOSPITALISTS STROKE TEAM - DAILY PROGRESS NOTE   ADMISSION HISTORY: Mark [name] is a 42 y.o. male who has a past medical history of cocaine and marijuana abuse, chronic systolic CHF (non ischemic cardiomyopathy), tobacco abuse, asthma, who presented to the emergency room for evaluation of multiple falls and shortness of breath.  It is unclear when his last known normal was.   He was evaluated in the emergency room by the ED providers and noted to have left-sided weakness.  He says that this weakness is off and on now going on for over a year.  He was in the emergency room a week ago and did not have any focal weakness.  The patient reports that he has off-and-on numbness of the left arm and leg and that makes it difficult for him to move that side.   Upon the examination during the early part of this encounter, he was completely flaccid on his left upper extremity but later on was able to move the left upper extremity antigravity with some drift.   He is currently on a BiPAP as he had hypercarbic respiratory failure on arrival.  LKW: unclear. Best per history - talked to family at 1700 hrs on 09/27/17 and speech was normal at the time. No clear time of last witnessed normal. Attempted to reach the number for emergency contact on the chart, but unsuccessful. tpa given?: no, OSW Premorbid modified Rankin scale (mRS): 1 NIHSS 1a Level of Conscious.: 1 1b LOC Questions: 1 1c LOC Commands: 0 2 Best Gaze: 1 3 Visual: 1 4 Facial Palsy: 0 5a Motor Arm - left: 1 5b Motor Arm - Right: 0 6a Motor Leg - Left: 1 6b Motor Leg - Right: 0 7 Limb Ataxia: 0 8 Sensory: 0 9 Best Language: 0 10 Dysarthria: 1 11 Extinct. and Inatten.: 0 TOTAL: 7  SUBJECTIVE (INTERVAL HISTORY) No family is at the bedside. Patient is found sitting up in bedside chair . Overall he feels his condition is unchanged. Voices no new complaints. No new events reported  overnight.   OBJECTIVE Lab Results: CBC:  Recent Labs  Lab 09/28/17 0158 09/28/17 0235  WBC 9.0  --   HGB 12.2* 14.3  HCT 36.7* 42.0  MCV 78.6  --   PLT 176  --    BMP: Recent Labs  Lab 09/28/17 0158 09/28/17 0235  NA 136 137  K 4.4 4.4  CL 101 99*  CO2 26  --   GLUCOSE 122* 118*  BUN 21* 24*  CREATININE 1.57* 1.50*  CALCIUM 8.7*  --    Liver Function Tests:  Recent Labs  Lab 09/28/17 0158  AST 105*  ALT 250*  ALKPHOS 96  BILITOT 0.6  PROT 6.1*  ALBUMIN 3.1*   Cardiac Enzymes:  Recent Labs  Lab 09/28/17 1101 09/28/17 2011  TROPONINI 0.34* 0.24*   Urinalysis:  Recent Labs  Lab 09/28/17 0615  COLORURINE YELLOW  APPEARANCEUR CLEAR  LABSPEC 1.019  PHURINE 6.0  GLUCOSEU NEGATIVE  HGBUR NEGATIVE  BILIRUBINUR NEGATIVE  KETONESUR NEGATIVE  PROTEINUR 100*  NITRITE NEGATIVE  LEUKOCYTESUR NEGATIVE   Urine Drug Screen:     Component Value Date/Time   LABOPIA NONE  DETECTED 09/28/2017 0615   COCAINSCRNUR POSITIVE (A) 09/28/2017 0615   LABBENZ NONE DETECTED 09/28/2017 0615   AMPHETMU NONE DETECTED 09/28/2017 0615   THCU POSITIVE (A) 09/28/2017 0615   LABBARB NONE DETECTED 09/28/2017 0615    Alcohol Level:  Recent Labs  Lab 09/28/17 0444  ETH <10    PHYSICAL EXAM Temp:  [97.6 F (36.4 C)-98.7 F (37.1 C)] 97.6 F (36.4 C) (02/08 1128) Pulse Rate:  [51-114] 88 (02/08 1128) Resp:  [14-23] 23 (02/08 1128) BP: (107-135)/(73-114) 117/85 (02/08 1128) SpO2:  [95 %-99 %] 99 % (02/08 1128) Weight:  [164 lb 14.5 oz (74.8 kg)] 164 lb 14.5 oz (74.8 kg) (02/07 1934) General - Well nourished, well developed middle aged African-American male, in no apparent distress HEENT-  Normocephalic,  Cardiovascular - Regular rate and rhythm  Respiratory - Lungs clear bilaterally. No wheezing. Abdomen - soft and non-tender, BS normal Extremities- no edema or cyanosis Awake alert, follows commands. Speech is dysarthric. Poor attention and concentration. Able to  say he is in hospital. Unable to reliably name simple objects. Unable to repeat CN: PERRL, seems to have right gaze preference but is able to overcome and look to the left. Does not blink to threat from either direction. Face droop on Left. Tongue deviation to left. Motor: RUE and RLE 5/5. LUE/LLE 3/5, Mild Left sided neglect   Sensory: symmetrically diminished all over  Coord: did not perform FNF or HKS Gait testing was deferred  IMAGING: I have personally reviewed the radiological images below and agree with the radiology interpretations. Ct Head Wo Contrast Result Date: 09/28/2017 IMPRESSION: 1. Positive CTA for emergent large vessel occlusion involving distal right M1/proximal M2 segment. Flow seen distal to the site of occlusion, suggesting either subocclusive thrombus or possibly recanalization. Superimposed vasospasm may be contributory as well. Aspects equals 8. 2. Ischemic penumbra throughout the mid and posterior right MCA territory, primarily involving the right frontotemporal region. No core infarct identified by perfusion imaging. 3. Asymmetric leptomeningeal enhancement within the right MCA territory, likely related to collateralization. 4. Diffuse irregularity throughout the intracranial arterial circulation. While this finding may in part be atherosclerotic in nature, an underlying vasculopathy could also be considered. 5. Emphysema.   Mr Brain Wo Contrast Result Date: 09/28/2017 IMPRESSION: Moderate to large right MCA distribution infarct as described. There is associated slow/absent flow within the associated right M2 branch. Electronically Signed   By: Marnee Spring M.D.   On: 09/28/2017 12:09   Ct Angio Head W Or Wo Contrast Ct Angio Neck W And/or Wo Contrast Ct Cerebral Perfusion W Contrast Result Date: 09/28/2017 IMPRESSION: 1. Positive CTA for emergent large vessel occlusion involving distal right M1/proximal M2 segment. Flow seen distal to the site of occlusion, suggesting  either subocclusive thrombus or possibly recanalization. Superimposed vasospasm may be contributory as well. Aspects equals 8. 2. Ischemic penumbra throughout the mid and posterior right MCA territory, primarily involving the right frontotemporal region. No core infarct identified by perfusion imaging. 3. Asymmetric leptomeningeal enhancement within the right MCA territory, likely related to collateralization. 4. Diffuse irregularity throughout the intracranial arterial circulation. While this finding may in part be atherosclerotic in nature, an underlying vasculopathy could also be considered. 5. Emphysema.   Echocardiogram:                                   Left ventricle: The cavity size was normal. Systolic function was  normal. The estimated ejection fraction was in the range of 10%   to 15%. Severe diffuse hypokinesis     IMPRESSION: [name] is a 42 y.o. male with PMH of  substance abuse, nonischemic cardiomyopathy, tobacco abuse, asthma, with an unclear last known normal presenting to the emergency room for evaluation of shortness of breath and multiple falls with left-sided weakness that has been ongoing off and on for a year.  CTA shows evolving hypodensity in the Right M1 territory with CTA showing decreased opacification of a short segment of right m1 followed by complete opacification - discussed with neuroradiology and IR Dr. Corliss Skains. Given his poor cardiac status (latest EF 20%) and current respiratory distress, patient was deemed not be a good candidate for IR. No clear time of onset or last seen normal, which was the rationale for not paging out code stroke, and also makes him ineligible for IV-tPA. MRI reveals:  Moderate to large right MCA distribution infarct  Suspected Etiology:  secondary to cardiomyopathy from cocaine use Resultant Symptoms: left-sided weakness Stroke Risk Factors: diabetes mellitus, hyperlipidemia and hypertension Other Stroke Risk Factors:  Advanced age, Cigarette smoker, Polysubstance Abuse, CHF with known EF of 20-25%    PLAN  09/29/2017: Continue Aspirin/ Statin for now Frequent neuro checks Telemetry monitoring PT/OT/SLP Consult PM & Rehab Consult Case Management /MSW Ongoing aggressive stroke risk factor management Patient will be counseled to be compliant with his antithrombotic medications Patient will be counseled on Lifestyle modifications including, Diet, Exercise, and Stress Follow up with [facility] in 6 weeks  DYSPHAGIA: Passed SLP swallow evaluation - Dysphasia 2 nectar liquids  Aspiration Precautions in progress  HYPERTENSION: Stable, Avoid Hypotension and Dehydration Permissive hypertension (OK if <220/120) for 24-48 hours post stroke and then gradually normalized within 5-7 days. Long term BP goal normotensive. May slowly restart home B/P medications after 48 hours Home Meds: Bidil, Coreg  HYPERLIPIDEMIA:    Component Value Date/Time   CHOL 113 09/28/2017 0158   TRIG 76 09/28/2017 0158   HDL 30 (L) 09/28/2017 0158   CHOLHDL 3.8 09/28/2017 0158   VLDL 15 09/28/2017 0158   LDLCALC 68 09/28/2017 0158  Home Meds:  NONE LDL  goal < 70 Started on  Lipitor to 20 mg daily Continue statin at discharge  PRE- DIABETES: Lab Results  Component Value Date   HGBA1C 6.4 (H) 09/28/2017  HgbA1c goal < 7.0 Continue CBG monitoring and SSI to maintain glucose 140-180 mg/dl DM education   TOBACCO ABUSE & POLYSUBSTANCE ABUSE UDS+  cocaine and marijuana abuse Current smoker Smoking cessation counseling provided Nicotine patch provided CIWA Protocol PRN  Other Active Problems: Principal Problem:   Acute CVA (cerebrovascular accident) (HCC) Active Problems:   Tobacco abuse   Elevated troponin   NICM (nonischemic cardiomyopathy) (HCC)   CKD (chronic kidney disease) stage 2, GFR 60-89 ml/min   Cocaine abuse (HCC)   CHF (congestive heart failure) [facility])    Hospital day # 1 VTE  prophylaxis: Lovenox  Diet : Fall precautions DIET DYS 3 Room service appropriate? Yes; Fluid consistency: Nectar Thick   FAMILY UPDATES: No family at bedside  TEAM UPDATES: Darlin Drop, DO   Prior Home Stroke Medications:  aspirin 81 mg daily  Discharge Stroke Meds:  Please discharge patient on aspirin 325 mg daily for now  Disposition: 01-Home or Self Care Therapy Recs:               PENDING Follow Up:  Follow-up  Information    Micki Riley, MD. Schedule an appointment as soon as possible for a visit in 6 week(s).   Specialties:  Neurology, Radiology Contact information: [address] [place] [address] [phone]          Patient, No Pcp Per -PCP Follow up in 1-2 weeks   Case Management aware of need   Assessment & plan discussed with with attending physician and they are in agreement.       Continue aspirin 325 mg daily cardiology consult for his cardiomyopathy and aggressive risk factor modification. Therapy and rehabilitation consults. Patient is unlikely to be a good long-term anticoagulation candidate given his lifestyle and noncompliance. Patient counseled to quit marijuana, cocaine and alcohol. Greater than 50% time during this 25 minute visit was spent on counseling and coordination of care about his stroke and answering questions. Discussed with Dr. Margo Aye. Stroke team will sign off. Kindly call for questions. Delia Heady, MD Medical Director [facility] Pager: [phone] 09/29/2017 12:21 PM  To contact Stroke Continuity provider, please refer to [email or web address]. After hours, contact General Neurology

## 2017-09-29 NOTE — Progress Notes (Signed)
Attempted NIH, pt states he wants to sleep to leave him alone.

## 2017-09-29 NOTE — Consult Note (Signed)
Physical Medicine and Rehabilitation Consult   Reason for Consult: stroke with functional deficits Referring Physician: Dr. Margo Aye   HPI: Mark [name] is a 42 y.o. male with history of NICM, CKD, polysubstance abuse, asthma--recent exacerbation, recent admission for acute systolic CHF and was readmitted on 09/28/17 with lethargy, left sided weakness and SOB. UDS positive fo THC and cocaine.  He was placed on BIPAP for hypercarbic respiratory failuremental status changes and  CTA head/neck showed emergent large vessel occlusion involving distal M1/proximal M2 segment with ischemic penumbra throughout mid and posterior R-MCA territory--primarily involving frontoparietal region. No tPA candidate due to no clear time of onset. Stroke felt to be due to CM v/s vasospasm from cocaine use and work up underway. Dr. Pearlean Brownie recommended continuing ASA and statin as well as lifestyle modifications. 2 D echo done revealing EF 10-15% with severe diffuse hypokinesis and moderate to severe MVR. Therapy evaluations done today revealing left hemiparesis with unsteady gait, left inattention, lack of safety awareness, delayed processing and pharyngeal dysphagia. CIR recommended due to functional deficits.    Review of Systems  Constitutional: Negative for chills, fever and malaise/fatigue.  HENT: Negative for hearing loss and tinnitus.   Eyes: Negative for blurred vision and double vision.  Respiratory: Negative for cough and shortness of breath.   Cardiovascular: Negative for chest pain and palpitations.  Gastrointestinal: Negative for constipation, heartburn and nausea.  Musculoskeletal: Negative for back pain, joint pain and myalgias.  Neurological: Positive for sensory change and focal weakness. Negative for dizziness, speech change and headaches.      Past Medical History:  Diagnosis Date  . Asthma   . Chronic systolic CHF (congestive heart failure) (HCC)   . Cigarette smoker   . History of  echocardiogram    a. Echo 5/17 - EF 20-25%, severe diff HK, restrictive physiology, mild to mod MR, severe reduced RVSF, mod RVE, mild RAE, mod TR, PASP 48 mmHg  . Hx of cardiac cath    a. LHC 5/17 - normal coronary arteries. PA 45/25, mean 33, PCWP mean 18  . NICM (nonischemic cardiomyopathy) (HCC)   . Substance abuse (HCC)    cocaine, marijuana    Past Surgical History:  Procedure Laterality Date  . CARDIAC CATHETERIZATION N/A 01/05/2016   Procedure: Right/Left Heart Cath and Coronary Angiography;  Surgeon: Lennette Bihari, MD;  Location: [facility] INVASIVE CV LAB;  Service: Cardiovascular;  Laterality: N/A;  . none      Family History  Problem Relation Age of Onset  . Cardiomyopathy Father        Reports his father has an LVAD  . Heart failure Father   . Hypertension Father   . Deep vein thrombosis Neg Hx     Social History:  Lives with fiancee. Independent and working at [employer].  He  reports that he has been smoking cigarettes--about a pack a week?.  He has a 10.00 pack-year smoking history. he has never used smokeless tobacco. He reports that he drinks alcohol 5-6 shots of vodka/daily. He reports that he uses drugs. Drugs: Cocaine and Marijuana.    Allergies  Allergen Reactions  . Hydrocodone Hives  . Lisinopril Swelling and Other (See Comments)    Facial swelling/angioedema  . Prednisone Shortness Of Breath, Nausea Only, Swelling and Other (See Comments)    Also made chest feel tight and genital area, legs, and face became swollen badly  . Penicillins Hives and Swelling     Has patient had a  PCN reaction causing immediate rash, facial/tongue/throat swelling, SOB or lightheadedness with hypotension: Yes Has patient had a PCN reaction causing severe rash involving mucus membranes or skin necrosis: No Has patient had a PCN reaction that required hospitalization: No Has patient had a PCN reaction occurring within the last 10 years: No If all of the above answers are "NO", then may  proceed with Cephalosporin use.     Medications Prior to Admission  Medication Sig Dispense Refill  . albuterol (PROVENTIL HFA;VENTOLIN HFA) 108 (90 Base) MCG/ACT inhaler Inhale 1-2 puffs into the lungs every 6 (six) hours as needed for wheezing or shortness of breath. 1 Inhaler 4  . aspirin 81 MG EC tablet Take 1 tablet (81 mg total) by mouth daily. 30 tablet 4  . beclomethasone (QVAR) 80 MCG/ACT inhaler Inhale 1 puff into the lungs 2 (two) times daily. 1 Inhaler 4  . benzonatate (TESSALON) 100 MG capsule Take 1 capsule (100 mg total) by mouth 3 (three) times daily as needed for cough. 340B Chris 30 capsule 0  . carvedilol (COREG) 3.125 MG tablet Take 1 tablet (3.125 mg total) by mouth 2 (two) times daily with a meal. 60 tablet 4  . furosemide (LASIX) 40 MG tablet Take 1 tablet (40 mg total) by mouth daily. 30 tablet 4  . guaiFENesin-dextromethorphan (ROBITUSSIN DM) 100-10 MG/5ML syrup Take 5 mLs by mouth every 4 (four) hours as needed for cough. 118 mL 0  . isosorbide-hydrALAZINE (BIDIL) 20-37.5 MG tablet Take 1 tablet by mouth 3 (three) times daily. 90 tablet 4    Home: Home Living Family/patient expects to be discharged to:: Private residence Living Arrangements: Spouse/significant other(fiance) Available Help at Discharge: Family, Friend(s), Available PRN/intermittently Type of Home: House Home Access: Stairs to enter Secretary/administrator of Steps: 2 Entrance Stairs-Rails: None Home Layout: One level Bathroom Shower/Tub: Engineer, manufacturing systems: Standard Home Equipment: None Additional Comments: Lives with fiance who works  Functional History: Prior Function Level of Independence: Independent Comments: Works at [employer] Functional Status:  Mobility: Bed Mobility General bed mobility comments: Received sitting EOB Transfers Overall transfer level: Needs assistance Equipment used: 1 person hand held assist Transfers: Sit to/from Stand Sit to Stand: Min assist, +2  safety/equipment General transfer comment: MinA for balance upon standing. Decreased awareness of L-side Ambulation/Gait Ambulation/Gait assistance: Min assist, +2 safety/equipment Ambulation Distance (Feet): 150 Feet Assistive device: 1 person hand held assist Gait Pattern/deviations: Step-through pattern, Decreased stride length, Staggering left, Drifts right/left, Narrow base of support General Gait Details: Slow, very unsteady amb with intermittent minA for balance. Pt drifting towards L-side, running therapist into wall without realizing. Decreased ability to follow multi-step commands. Demonstrating no awareness of deficits Gait velocity: Decreased Gait velocity interpretation: <1.8 ft/sec, indicative of risk for recurrent falls    ADL: ADL Overall ADL's : Needs assistance/impaired Eating/Feeding: Supervision/ safety, Sitting, Minimal assistance Eating/Feeding Details (indicate cue type and reason): Pt able to use spoon to feed himself diet, inattention on L side of tray, vc to find objects, and min A for Bilateral tasks due to decreased fine motor Grooming: Moderate assistance, Standing Grooming Details (indicate cue type and reason): assist for standing balance during tasks requiring more attention, decreased coordination in LUE requires mod A Upper Body Bathing: Minimal assistance, Sitting Lower Body Bathing: Minimal assistance, Sitting/lateral leans Upper Body Dressing : Minimal assistance Upper Body Dressing Details (indicate cue type and reason): to don hospital gown Lower Body Dressing: Minimal assistance, Sit to/from stand Lower Body Dressing Details (indicate cue  type and reason): Pt able to cross feet and bring up to knees to don socks - L hand discoordinated decreased strength in pinch/grasp and Pt requires min A  Toilet Transfer: Minimal assistance, +2 for physical assistance, Ambulation, Cueing for safety, Cueing for sequencing Toileting- Clothing Manipulation and  Hygiene: Moderate assistance, Sit to/from stand Toileting - Clothing Manipulation Details (indicate cue type and reason): mod A for balance Tub/ Shower Transfer: Minimal assistance, +2 for safety/equipment, Ambulation Functional mobility during ADLs: Minimal assistance, +2 for safety/equipment, Cueing for safety(IN attention to the left side impacting environmental nav.)  Cognition: Cognition Overall Cognitive Status: No family/caregiver present to determine baseline cognitive functioning Orientation Level: Oriented X4 Cognition Arousal/Alertness: Awake/alert Behavior During Therapy: Flat affect Overall Cognitive Status: No family/caregiver present to determine baseline cognitive functioning Area of Impairment: Attention, Following commands, Safety/judgement, Awareness, Problem solving Current Attention Level: Selective Following Commands: Follows multi-step commands inconsistently Safety/Judgement: Decreased awareness of safety, Decreased awareness of deficits Awareness: Emergent Problem Solving: Slow processing, Decreased initiation, Requires verbal cues, Requires tactile cues General Comments: Pt demonstrating L-side neglect throughout session, requiring cues to attend to L-side; also, delay noted when moving L-side (UE>LE). Pt upset regarding dysphagia 2 diet, perseverating on this during session. Decreased attention and easily distracted   Blood pressure 117/85, pulse 88, temperature 97.6 F (36.4 C), temperature source Oral, resp. rate (!) 23, height 5\' 9"  (1.753 m), weight 74.8 kg (164 lb 14.5 oz), SpO2 99 %. Physical Exam  Nursing note and vitals reviewed. Constitutional: He is oriented to person, place, and time. He appears well-developed and well-nourished. No distress.  HENT:  Head: Normocephalic and atraumatic.  Mouth/Throat: Oropharynx is clear and moist.  Eyes: Conjunctivae and EOM are normal. Pupils are equal, round, and reactive to light.  Neck: Normal range of motion.  Neck supple.  Cardiovascular: Regular rhythm.  Respiratory: Effort normal. No stridor. He has wheezes in the right lower field.  GI: Soft. Bowel sounds are normal. He exhibits no distension. There is no tenderness.  Musculoskeletal: He exhibits no edema or tenderness.  Neurological: He is alert and oriented to person, place, and time.  Left facial weakness without dysarthria. Left inattention. Able to turn eyes to left field. He does not have insight into and lacks awareness of deficits as well as his medical issues. Able to follow simple motor commands. Left hemiparesis with sensory deficits.   Skin: Skin is warm and dry. He is not diaphoretic.  Psychiatric: His affect is blunt. He expresses impulsivity. He is noncommunicative. He is inattentive.    Results for orders placed or performed during the hospital encounter of 09/28/17 (from the past 24 hour(s))  Troponin I (q 6hr x 3)     Status: Abnormal   Collection Time: 09/28/17  8:11 PM  Result Value Ref Range   Troponin I 0.24 (HH) <0.03 ng/mL  MRSA PCR Screening     Status: Abnormal   Collection Time: 09/28/17  8:11 PM  Result Value Ref Range   MRSA by PCR POSITIVE (A) NEGATIVE   Ct Angio Head W Or Wo Contrast  Result Date: 09/28/2017 CLINICAL DATA:  Initial evaluation for acute left-sided weakness, falls. EXAM: CT ANGIOGRAPHY HEAD AND NECK CT PERFUSION BRAIN TECHNIQUE: Multidetector CT imaging of the head and neck was performed using the standard protocol during bolus administration of intravenous contrast. Multiplanar CT image reconstructions and MIPs were obtained to evaluate the vascular anatomy. Carotid stenosis measurements (when applicable) are obtained utilizing NASCET criteria, using the distal internal  carotid diameter as the denominator. Multiphase CT imaging of the brain was performed following IV bolus contrast injection. Subsequent parametric perfusion maps were calculated using RAPID software. CONTRAST:  ISOVUE-370  IOPAMIDOL (ISOVUE-370) INJECTION 76% COMPARISON:  None. FINDINGS: CT HEAD FINDINGS Brain: Examination mildly limited by patient positioning. No acute intracranial hemorrhage. Abnormal hypodensity seen involving the right insular cortex, concerning for evolving right MCA territory infarct (series 3, image 19). Possible extension into the adjacent into cortex lateral to the insular ribbon. Basal ganglia and internal capsule are spared at this time. No other acute large vessel territory infarct. No mass lesion or midline shift. No hydrocephalus. No extra-axial fluid collection. Vascular: Abnormal hyperdensity within distal right M1 and/or M2 branch at the base of the sylvian fissure (series 3, image 17), concerning for thrombus. Skull: Scalp soft tissues and calvarium within normal limits. 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 Mild right sphenoid sinus opacification. Paranasal sinuses are otherwise clear. Mastoids are clear. No acute abnormality about the globes or orbits. Defect noted at the posterior left globe. Other: None. ASPECTS (Alberta Stroke Program Early CT Score) - Ganglionic level infarction (caudate, lentiform nuclei, internal capsule, insula, M1-M3 cortex): 5 - Supraganglionic infarction (M4-M6 cortex): 3 Total score (0-10 with 10 being normal): 8 Review of the MIP images confirms the above findings CTA NECK FINDINGS Aortic arch: Visualized aortic arch of normal caliber with normal branch pattern. No flow-limiting stenosis about the origin of the great vessels. Visualized subclavian arteries patent. Right carotid system: Right common and internal carotid arteries are widely patent without stenosis, dissection, or occlusion. No significant atheromatous narrowing about the right carotid bifurcation. Left carotid system: Left common and internal carotid arteries are widely patent without stenosis, dissection, or occlusion. No significant atheromatous  narrowing about the left carotid bifurcation. Vertebral arteries: Left vertebral artery diffusely hypoplastic and arises from the aortic arch. Vertebral arteries widely patent within the neck without stenosis, dissection, or occlusion. Skeleton: No acute osseous abnormality. No worrisome lytic or blastic osseous lesions. Other neck: No acute soft tissue abnormality within the neck. No adenopathy. Salivary glands within normal limits. Thyroid within normal limits. Upper chest: Visualized upper chest demonstrates no acute abnormality. Emphysema. Visualized lungs are otherwise clear. Review of the MIP images confirms the above findings CTA HEAD FINDINGS Anterior circulation: Petrous segments widely patent. Cavernous and supraclinoid segments patent without high-grade stenosis. ICA termini patent. A1 segments patent. Anterior communicating artery patent and normal. Anterior cerebral arteries patent to their distal aspects without high-grade stenosis. Left M1 segment patent without stenosis. No proximal left M2 occlusion. Distal left MCA branches well perfused. Right M1 segment patent to its distal aspect. There is abrupt occlusion of the distal right M1 segment at the base of the sylvian fissure (series 13, image 99). This appears to be subocclusive with flow seen distally (series 13, image 94). No other proximal or large vessel occlusion. Asymmetric prominence and flow within the distal right MCA branches with left meningeal enhancement as compared to the left cerebral hemisphere, suspected to be related to collateral flow. Right MCA branches otherwise perfused. Posterior circulation: Right vertebral artery patent to the vertebrobasilar junction without high-grade stenosis. Hypoplastic left vertebral artery largely terminates in PICA. Basilar artery diminutive but patent to its distal aspect. Superior cerebral arteries patent bilaterally. Fetal type origin of the PCAs bilaterally. PCAs irregular but patent to their  distal aspects. There is diffuse irregularity throughout the intracranial circulation. While this finding may in part be atherosclerotic  in nature, the diffuse nature suggest possible vasculopathy. Venous sinuses: Patent. Anatomic variants: Fetal type origin of the PCAs with diminutive vertebrobasilar system. No aneurysm. Delayed phase: Not performed. Review of the MIP images confirms the above findings CT Brain Perfusion Findings: CBF (<30%) Volume: 61mL Perfusion (Tmax>6.0s) volume: 23mL Mismatch Volume: 66mL Infarction Location:No core infarct by CT perfusion. Ischemic penumbra throughout the mid and posterior right MCA territory, primarily involving the right frontotemporal region. IMPRESSION: 1. Positive CTA for emergent large vessel occlusion involving distal right M1/proximal M2 segment. Flow seen distal to the site of occlusion, suggesting either subocclusive thrombus or possibly recanalization. Superimposed vasospasm may be contributory as well. Aspects equals 8. 2. Ischemic penumbra throughout the mid and posterior right MCA territory, primarily involving the right frontotemporal region. No core infarct identified by perfusion imaging. 3. Asymmetric leptomeningeal enhancement within the right MCA territory, likely related to collateralization. 4. Diffuse irregularity throughout the intracranial arterial circulation. While this finding may in part be atherosclerotic in nature, an underlying vasculopathy could also be considered. 5. Emphysema. Critical Value/emergent results were called by telephone at the time of interpretation on 09/28/2017 at 3:37 am to Dr. Milon Dikes , who verbally acknowledged these results. Electronically Signed   By: Rise Mu M.D.   On: 09/28/2017 04:13   Ct Head Wo Contrast  Result Date: 09/28/2017 CLINICAL DATA:  Initial evaluation for acute left-sided weakness, falls. EXAM: CT ANGIOGRAPHY HEAD AND NECK CT PERFUSION BRAIN TECHNIQUE: Multidetector CT imaging of the head  and neck was performed using the standard protocol during bolus administration of intravenous contrast. Multiplanar CT image reconstructions and MIPs were obtained to evaluate the vascular anatomy. Carotid stenosis measurements (when applicable) are obtained utilizing NASCET criteria, using the distal internal carotid diameter as the denominator. Multiphase CT imaging of the brain was performed following IV bolus contrast injection. Subsequent parametric perfusion maps were calculated using RAPID software. CONTRAST:  ISOVUE-370 IOPAMIDOL (ISOVUE-370) INJECTION 76% COMPARISON:  None. FINDINGS: CT HEAD FINDINGS Brain: Examination mildly limited by patient positioning. No acute intracranial hemorrhage. Abnormal hypodensity seen involving the right insular cortex, concerning for evolving right MCA territory infarct (series 3, image 19). Possible extension into the adjacent into cortex lateral to the insular ribbon. Basal ganglia and internal capsule are spared at this time. No other acute large vessel territory infarct. No mass lesion or midline shift. No hydrocephalus. No extra-axial fluid collection. Vascular: Abnormal hyperdensity within distal right M1 and/or M2 branch at the base of the sylvian fissure (series 3, image 17), concerning for thrombus. Skull: Scalp soft tissues and calvarium within normal limits. 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 Mild right sphenoid sinus opacification. Paranasal sinuses are otherwise clear. Mastoids are clear. No acute abnormality about the globes or orbits. Defect noted at the posterior left globe. Other: None. ASPECTS (Alberta Stroke Program Early CT Score) - Ganglionic level infarction (caudate, lentiform nuclei, internal capsule, insula, M1-M3 cortex): 5 - Supraganglionic infarction (M4-M6 cortex): 3 Total score (0-10 with 10 being normal): 8 Review of the MIP images confirms the above findings CTA NECK FINDINGS  Aortic arch: Visualized aortic arch of normal caliber with normal branch pattern. No flow-limiting stenosis about the origin of the great vessels. Visualized subclavian arteries patent. Right carotid system: Right common and internal carotid arteries are widely patent without stenosis, dissection, or occlusion. No significant atheromatous narrowing about the right carotid bifurcation. Left carotid system: Left common and internal carotid arteries are widely patent without stenosis, dissection, or  occlusion. No significant atheromatous narrowing about the left carotid bifurcation. Vertebral arteries: Left vertebral artery diffusely hypoplastic and arises from the aortic arch. Vertebral arteries widely patent within the neck without stenosis, dissection, or occlusion. Skeleton: No acute osseous abnormality. No worrisome lytic or blastic osseous lesions. Other neck: No acute soft tissue abnormality within the neck. No adenopathy. Salivary glands within normal limits. Thyroid within normal limits. Upper chest: Visualized upper chest demonstrates no acute abnormality. Emphysema. Visualized lungs are otherwise clear. Review of the MIP images confirms the above findings CTA HEAD FINDINGS Anterior circulation: Petrous segments widely patent. Cavernous and supraclinoid segments patent without high-grade stenosis. ICA termini patent. A1 segments patent. Anterior communicating artery patent and normal. Anterior cerebral arteries patent to their distal aspects without high-grade stenosis. Left M1 segment patent without stenosis. No proximal left M2 occlusion. Distal left MCA branches well perfused. Right M1 segment patent to its distal aspect. There is abrupt occlusion of the distal right M1 segment at the base of the sylvian fissure (series 13, image 99). This appears to be subocclusive with flow seen distally (series 13, image 94). No other proximal or large vessel occlusion. Asymmetric prominence and flow within the distal  right MCA branches with left meningeal enhancement as compared to the left cerebral hemisphere, suspected to be related to collateral flow. Right MCA branches otherwise perfused. Posterior circulation: Right vertebral artery patent to the vertebrobasilar junction without high-grade stenosis. Hypoplastic left vertebral artery largely terminates in PICA. Basilar artery diminutive but patent to its distal aspect. Superior cerebral arteries patent bilaterally. Fetal type origin of the PCAs bilaterally. PCAs irregular but patent to their distal aspects. There is diffuse irregularity throughout the intracranial circulation. While this finding may in part be atherosclerotic in nature, the diffuse nature suggest possible vasculopathy. Venous sinuses: Patent. Anatomic variants: Fetal type origin of the PCAs with diminutive vertebrobasilar system. No aneurysm. Delayed phase: Not performed. Review of the MIP images confirms the above findings CT Brain Perfusion Findings: CBF (<30%) Volume: 0mL Perfusion (Tmax>6.0s) volume: 89mL Mismatch Volume: 89mL Infarction Location:No core infarct by CT perfusion. Ischemic penumbra throughout the mid and posterior right MCA territory, primarily involving the right frontotemporal region. IMPRESSION: 1. Positive CTA for emergent large vessel occlusion involving distal right M1/proximal M2 segment. Flow seen distal to the site of occlusion, suggesting either subocclusive thrombus or possibly recanalization. Superimposed vasospasm may be contributory as well. Aspects equals 8. 2. Ischemic penumbra throughout the mid and posterior right MCA territory, primarily involving the right frontotemporal region. No core infarct identified by perfusion imaging. 3. Asymmetric leptomeningeal enhancement within the right MCA territory, likely related to collateralization. 4. Diffuse irregularity throughout the intracranial arterial circulation. While this finding may in part be atherosclerotic in nature, an  underlying vasculopathy could also be considered. 5. Emphysema. Critical Value/emergent results were called by telephone at the time of interpretation on 09/28/2017 at 3:37 am to Dr. Milon Dikes , who verbally acknowledged these results. Electronically Signed   By: Rise Mu M.D.   On: 09/28/2017 04:13   Ct Angio Neck W And/or Wo Contrast  Result Date: 09/28/2017 CLINICAL DATA:  Initial evaluation for acute left-sided weakness, falls. EXAM: CT ANGIOGRAPHY HEAD AND NECK CT PERFUSION BRAIN TECHNIQUE: Multidetector CT imaging of the head and neck was performed using the standard protocol during bolus administration of intravenous contrast. Multiplanar CT image reconstructions and MIPs were obtained to evaluate the vascular anatomy. Carotid stenosis measurements (when applicable) are obtained utilizing NASCET criteria, using the distal internal  carotid diameter as the denominator. Multiphase CT imaging of the brain was performed following IV bolus contrast injection. Subsequent parametric perfusion maps were calculated using RAPID software. CONTRAST:  ISOVUE-370 IOPAMIDOL (ISOVUE-370) INJECTION 76% COMPARISON:  None. FINDINGS: CT HEAD FINDINGS Brain: Examination mildly limited by patient positioning. No acute intracranial hemorrhage. Abnormal hypodensity seen involving the right insular cortex, concerning for evolving right MCA territory infarct (series 3, image 19). Possible extension into the adjacent into cortex lateral to the insular ribbon. Basal ganglia and internal capsule are spared at this time. No other acute large vessel territory infarct. No mass lesion or midline shift. No hydrocephalus. No extra-axial fluid collection. Vascular: Abnormal hyperdensity within distal right M1 and/or M2 branch at the base of the sylvian fissure (series 3, image 17), concerning for thrombus. Skull: Scalp soft tissues and calvarium within normal limits. 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 Mild right sphenoid sinus opacification. Paranasal sinuses are otherwise clear. Mastoids are clear. No acute abnormality about the globes or orbits. Defect noted at the posterior left globe. Other: None. ASPECTS (Alberta Stroke Program Early CT Score) - Ganglionic level infarction (caudate, lentiform nuclei, internal capsule, insula, M1-M3 cortex): 5 - Supraganglionic infarction (M4-M6 cortex): 3 Total score (0-10 with 10 being normal): 8 Review of the MIP images confirms the above findings CTA NECK FINDINGS Aortic arch: Visualized aortic arch of normal caliber with normal branch pattern. No flow-limiting stenosis about the origin of the great vessels. Visualized subclavian arteries patent. Right carotid system: Right common and internal carotid arteries are widely patent without stenosis, dissection, or occlusion. No significant atheromatous narrowing about the right carotid bifurcation. Left carotid system: Left common and internal carotid arteries are widely patent without stenosis, dissection, or occlusion. No significant atheromatous narrowing about the left carotid bifurcation. Vertebral arteries: Left vertebral artery diffusely hypoplastic and arises from the aortic arch. Vertebral arteries widely patent within the neck without stenosis, dissection, or occlusion. Skeleton: No acute osseous abnormality. No worrisome lytic or blastic osseous lesions. Other neck: No acute soft tissue abnormality within the neck. No adenopathy. Salivary glands within normal limits. Thyroid within normal limits. Upper chest: Visualized upper chest demonstrates no acute abnormality. Emphysema. Visualized lungs are otherwise clear. Review of the MIP images confirms the above findings CTA HEAD FINDINGS Anterior circulation: Petrous segments widely patent. Cavernous and supraclinoid segments patent without high-grade stenosis. ICA termini patent. A1 segments patent. Anterior communicating  artery patent and normal. Anterior cerebral arteries patent to their distal aspects without high-grade stenosis. Left M1 segment patent without stenosis. No proximal left M2 occlusion. Distal left MCA branches well perfused. Right M1 segment patent to its distal aspect. There is abrupt occlusion of the distal right M1 segment at the base of the sylvian fissure (series 13, image 99). This appears to be subocclusive with flow seen distally (series 13, image 94). No other proximal or large vessel occlusion. Asymmetric prominence and flow within the distal right MCA branches with left meningeal enhancement as compared to the left cerebral hemisphere, suspected to be related to collateral flow. Right MCA branches otherwise perfused. Posterior circulation: Right vertebral artery patent to the vertebrobasilar junction without high-grade stenosis. Hypoplastic left vertebral artery largely terminates in PICA. Basilar artery diminutive but patent to its distal aspect. Superior cerebral arteries patent bilaterally. Fetal type origin of the PCAs bilaterally. PCAs irregular but patent to their distal aspects. There is diffuse irregularity throughout the intracranial circulation. While this finding may in part be atherosclerotic in  nature, the diffuse nature suggest possible vasculopathy. Venous sinuses: Patent. Anatomic variants: Fetal type origin of the PCAs with diminutive vertebrobasilar system. No aneurysm. Delayed phase: Not performed. Review of the MIP images confirms the above findings CT Brain Perfusion Findings: CBF (<30%) Volume: 0mL Perfusion (Tmax>6.0s) volume: 89mL Mismatch Volume: 89mL Infarction Location:No core infarct by CT perfusion. Ischemic penumbra throughout the mid and posterior right MCA territory, primarily involving the right frontotemporal region. IMPRESSION: 1. Positive CTA for emergent large vessel occlusion involving distal right M1/proximal M2 segment. Flow seen distal to the site of occlusion,  suggesting either subocclusive thrombus or possibly recanalization. Superimposed vasospasm may be contributory as well. Aspects equals 8. 2. Ischemic penumbra throughout the mid and posterior right MCA territory, primarily involving the right frontotemporal region. No core infarct identified by perfusion imaging. 3. Asymmetric leptomeningeal enhancement within the right MCA territory, likely related to collateralization. 4. Diffuse irregularity throughout the intracranial arterial circulation. While this finding may in part be atherosclerotic in nature, an underlying vasculopathy could also be considered. 5. Emphysema. Critical Value/emergent results were called by telephone at the time of interpretation on 09/28/2017 at 3:37 am to Dr. Milon Dikes , who verbally acknowledged these results. Electronically Signed   By: Rise Mu M.D.   On: 09/28/2017 04:13   Mr Brain Wo Contrast  Result Date: 09/28/2017 CLINICAL DATA:  Asthma. Congestive heart failure. Polysubstance abuse including cocaine. EXAM: MRI HEAD WITHOUT CONTRAST TECHNIQUE: Multiplanar, multiecho pulse sequences of the brain and surrounding structures were obtained without intravenous contrast. COMPARISON:  Head CT and CTA from earlier today FINDINGS: Brain: Moderate acute infarct involving the right insula, frontal operculum, and right corona radiata/centrum semiovale. No acute hemorrhage or hydrocephalus. No extra-axial collection or masslike finding. No pre-existing ischemic injury is noted. Vascular: Diminished flow voids and increased FLAIR signal within right M2 branches. Skull and upper cervical spine: No evidence of marrow lesion Sinuses/Orbits: Right maxillary sinusitis with mucosal thickening and secretions. The right maxillary sinus is completely opacified. 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 There is associated slow/absent flow within the associated right M2 branch. Electronically Signed   By:  Marnee Spring M.D.   On: 09/28/2017 12:09   Ct Cerebral Perfusion W Contrast  Result Date: 09/28/2017 CLINICAL DATA:  Initial evaluation for acute left-sided weakness, falls. EXAM: CT ANGIOGRAPHY HEAD AND NECK CT PERFUSION BRAIN TECHNIQUE: Multidetector CT imaging of the head and neck was performed using the standard protocol during bolus administration of intravenous contrast. Multiplanar CT image reconstructions and MIPs were obtained to evaluate the vascular anatomy. Carotid stenosis measurements (when applicable) are obtained utilizing NASCET criteria, using the distal internal carotid diameter as the denominator. Multiphase CT imaging of the brain was performed following IV bolus contrast injection. Subsequent parametric perfusion maps were calculated using RAPID software. CONTRAST:  ISOVUE-370 IOPAMIDOL (ISOVUE-370) INJECTION 76% COMPARISON:  None. FINDINGS: CT HEAD FINDINGS Brain: Examination mildly limited by patient positioning. No acute intracranial hemorrhage. Abnormal hypodensity seen involving the right insular cortex, concerning for evolving right MCA territory infarct (series 3, image 19). Possible extension into the adjacent into cortex lateral to the insular ribbon. Basal ganglia and internal capsule are spared at this time. No other acute large vessel territory infarct. No mass lesion or midline shift. No hydrocephalus. No extra-axial fluid collection. Vascular: Abnormal hyperdensity within distal right M1 and/or M2 branch at the base of the sylvian fissure (series 3, image 17), concerning for thrombus. Skull: Scalp soft tissues and calvarium  within normal limits. 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 Mild right sphenoid sinus opacification. Paranasal sinuses are otherwise clear. Mastoids are clear. No acute abnormality about the globes or orbits. Defect noted at the posterior left globe. Other: None. ASPECTS (Alberta Stroke  Program Early CT Score) - Ganglionic level infarction (caudate, lentiform nuclei, internal capsule, insula, M1-M3 cortex): 5 - Supraganglionic infarction (M4-M6 cortex): 3 Total score (0-10 with 10 being normal): 8 Review of the MIP images confirms the above findings CTA NECK FINDINGS Aortic arch: Visualized aortic arch of normal caliber with normal branch pattern. No flow-limiting stenosis about the origin of the great vessels. Visualized subclavian arteries patent. Right carotid system: Right common and internal carotid arteries are widely patent without stenosis, dissection, or occlusion. No significant atheromatous narrowing about the right carotid bifurcation. Left carotid system: Left common and internal carotid arteries are widely patent without stenosis, dissection, or occlusion. No significant atheromatous narrowing about the left carotid bifurcation. Vertebral arteries: Left vertebral artery diffusely hypoplastic and arises from the aortic arch. Vertebral arteries widely patent within the neck without stenosis, dissection, or occlusion. Skeleton: No acute osseous abnormality. No worrisome lytic or blastic osseous lesions. Other neck: No acute soft tissue abnormality within the neck. No adenopathy. Salivary glands within normal limits. Thyroid within normal limits. Upper chest: Visualized upper chest demonstrates no acute abnormality. Emphysema. Visualized lungs are otherwise clear. Review of the MIP images confirms the above findings CTA HEAD FINDINGS Anterior circulation: Petrous segments widely patent. Cavernous and supraclinoid segments patent without high-grade stenosis. ICA termini patent. A1 segments patent. Anterior communicating artery patent and normal. Anterior cerebral arteries patent to their distal aspects without high-grade stenosis. Left M1 segment patent without stenosis. No proximal left M2 occlusion. Distal left MCA branches well perfused. Right M1 segment patent to its distal aspect.  There is abrupt occlusion of the distal right M1 segment at the base of the sylvian fissure (series 13, image 99). This appears to be subocclusive with flow seen distally (series 13, image 94). No other proximal or large vessel occlusion. Asymmetric prominence and flow within the distal right MCA branches with left meningeal enhancement as compared to the left cerebral hemisphere, suspected to be related to collateral flow. Right MCA branches otherwise perfused. Posterior circulation: Right vertebral artery patent to the vertebrobasilar junction without high-grade stenosis. Hypoplastic left vertebral artery largely terminates in PICA. Basilar artery diminutive but patent to its distal aspect. Superior cerebral arteries patent bilaterally. Fetal type origin of the PCAs bilaterally. PCAs irregular but patent to their distal aspects. There is diffuse irregularity throughout the intracranial circulation. While this finding may in part be atherosclerotic in nature, the diffuse nature suggest possible vasculopathy. Venous sinuses: Patent. Anatomic variants: Fetal type origin of the PCAs with diminutive vertebrobasilar system. No aneurysm. Delayed phase: Not performed. Review of the MIP images confirms the above findings CT Brain Perfusion Findings: CBF (<30%) Volume: 0mL Perfusion (Tmax>6.0s) volume: 89mL Mismatch Volume: 89mL Infarction Location:No core infarct by CT perfusion. Ischemic penumbra throughout the mid and posterior right MCA territory, primarily involving the right frontotemporal region. IMPRESSION: 1. Positive CTA for emergent large vessel occlusion involving distal right M1/proximal M2 segment. Flow seen distal to the site of occlusion, suggesting either subocclusive thrombus or possibly recanalization. Superimposed vasospasm may be contributory as well. Aspects equals 8. 2. Ischemic penumbra throughout the mid and posterior right MCA territory, primarily involving the right frontotemporal region. No core  infarct identified by perfusion imaging. 3. Asymmetric leptomeningeal  enhancement within the right MCA territory, likely related to collateralization. 4. Diffuse irregularity throughout the intracranial arterial circulation. While this finding may in part be atherosclerotic in nature, an underlying vasculopathy could also be considered. 5. Emphysema. Critical Value/emergent results were called by telephone at the time of interpretation on 09/28/2017 at 3:37 am to Dr. Milon Dikes , who verbally acknowledged these results. Electronically Signed   By: Rise Mu M.D.   On: 09/28/2017 04:13   Dg Chest Port 1 View  Result Date: 09/28/2017 CLINICAL DATA:  Acute onset of shortness of breath. EXAM: PORTABLE CHEST 1 VIEW COMPARISON:  Chest radiograph performed 09/20/2017, and CTA of the chest performed 09/21/2017 FINDINGS: The lungs are well-aerated and clear. There is no evidence of focal opacification, pleural effusion or pneumothorax. The cardiomediastinal silhouette is mildly enlarged. No acute osseous abnormalities are seen. IMPRESSION: Mild cardiomegaly.  Lungs remain grossly clear. Electronically Signed   By: Roanna Raider M.D.   On: 09/28/2017 02:24   Dg Swallowing Func-speech Pathology  Result Date: 09/28/2017 Objective Swallowing Evaluation: Type of Study: MBS-Modified Barium Swallow Study  Patient Details Name: LANDON BASSFORD MRN: [id number] Date of Birth: 12-Sep-1975 Today's Date: 09/28/2017 Time: SLP Start Time (ACUTE ONLY): 1425 -SLP Stop Time (ACUTE ONLY): 1447 SLP Time Calculation (min) (ACUTE ONLY): 22 min Past Medical History: Past Medical History: Diagnosis Date . Asthma  . Chronic systolic CHF (congestive heart failure) (HCC)  . Cigarette smoker  . History of echocardiogram   a. Echo 5/17 - EF 20-25%, severe diff HK, restrictive physiology, mild to mod MR, severe reduced RVSF, mod RVE, mild RAE, mod TR, PASP 48 mmHg . Hx of cardiac cath   a. LHC 5/17 - normal coronary arteries. PA 45/25, mean  33, PCWP mean 18 . NICM (nonischemic cardiomyopathy) (HCC)  . Substance abuse (HCC)   cocaine, marijuana Past Surgical History: Past Surgical History: Procedure Laterality Date . CARDIAC CATHETERIZATION N/A 01/05/2016  Procedure: Right/Left Heart Cath and Coronary Angiography;  Surgeon: Lennette Bihari, MD;  Location: [facility] INVASIVE CV LAB;  Service: Cardiovascular;  Laterality: N/A; . none   HPI: Solon C Hayesis a 42 y.o.malewho has apastmedical history of cocaine and marijuana abuse, chronic systolic CHF (non ischemic cardiomyopathy), tobacco abuse, asthma, who presented to the emergency room for evaluation of multiple falls and shortness of breath. Unknown last know normal. Pt reports inconsistent left sided weakness for over a year. MRI (2/7) revealed a moderate to large right MCA distribution infarct as described. There is associated slow/absent flow within the associated right M2 branch. CXR (2/7) Lungs remain grossly clear. Pt's swallow stroke screen failed secondary to inability to cough on demand.  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 Aspiration occured over the arytenoids likely related to impaired timing of airway closure and delayed swallow trigger at the pyriform sinuses. However, pt was able to clear only penetrates from laryngeal vestibule with cued and spontaneous throat clear/cough. Using chin tuck strategy with nectar thick liquids, the pt only had penetration, which cleared independently with subsequent swallow.  Chin tuck strategy was also used for solid trials to reduce moderate residue. Pt required Mod verbal cues to maintain chin tuck during trials and reduce impulsivity. Oral  deficits (weak lingual manipulation, reduced posterior propulsion,  lingual residue) resulted in decreased bolus cohesion and oral residue. Given observed aspiration/penetration and oral phase deficits recommend Nectar Thick liquids and Dys 2 (chopped) diet with mandatory chin tuck with each swallow and general aspiration precautions. Pt's degree of dysphagia likley to improve with pharyngeal exercises, increased oral control, and improved mentation. SLP will follow to ensure minimal overt signs of difficulty with prescribed diet and readiness to advance diet/repeat tesing.   SLP Visit Diagnosis Dysphagia, oropharyngeal phase (R13.12) Attention and concentration deficit following -- Frontal lobe and executive function deficit following -- Impact on safety and function Moderate aspiration risk   CHL IP TREATMENT RECOMMENDATION 09/28/2017 Treatment Recommendations Therapy as outlined in treatment plan below   Prognosis 09/28/2017 Prognosis for Safe Diet Advancement Good Barriers to Reach Goals Motivation;Cognitive deficits;Behavior Barriers/Prognosis Comment -- CHL IP DIET RECOMMENDATION 09/28/2017 SLP Diet Recommendations Dysphagia 2 (Fine chop) solids;Nectar thick liquid Liquid Administration via Cup;Straw Medication Administration Crushed with puree Compensations Minimize environmental distractions;Slow rate;Small sips/bites;Monitor for anterior loss;Chin tuck Postural Changes Seated upright at 90 degrees   CHL IP OTHER RECOMMENDATIONS 09/28/2017 Recommended Consults -- Oral Care Recommendations Oral care BID Other Recommendations Order thickener from pharmacy;Prohibited food (jello, ice cream, thin soups);Remove water pitcher   CHL IP FOLLOW UP RECOMMENDATIONS 09/28/2017 Follow up Recommendations Other (comment)   CHL IP FREQUENCY AND DURATION 09/28/2017 Speech Therapy Frequency (ACUTE ONLY) min 2x/week Treatment Duration 2 weeks      CHL IP ORAL PHASE 09/28/2017 Oral Phase Impaired Oral - Pudding Teaspoon -- Oral - Pudding Cup  -- Oral - Honey Teaspoon -- Oral - Honey Cup -- Oral - Nectar Teaspoon -- Oral - Nectar Cup Left anterior bolus loss;Decreased bolus cohesion Oral - Nectar Straw Left anterior bolus loss;Decreased bolus cohesion Oral - Thin Teaspoon -- Oral - Thin Cup Left anterior bolus loss;Decreased bolus cohesion Oral - Thin Straw Left anterior bolus loss;Decreased bolus cohesion Oral - Puree Left anterior bolus loss;Weak lingual manipulation;Reduced posterior propulsion;Lingual/palatal residue Oral - Mech Soft -- Oral - Regular Left anterior bolus loss;Weak lingual manipulation;Reduced posterior propulsion;Lingual/palatal residue Oral - Multi-Consistency -- Oral - Pill -- Oral Phase - Comment --  CHL IP PHARYNGEAL PHASE 09/28/2017 Pharyngeal Phase Impaired Pharyngeal- Pudding Teaspoon -- Pharyngeal -- Pharyngeal- Pudding Cup -- Pharyngeal -- Pharyngeal- Honey Teaspoon -- Pharyngeal -- Pharyngeal- Honey Cup -- Pharyngeal -- Pharyngeal- Nectar Teaspoon -- Pharyngeal -- Pharyngeal- Nectar Cup Compensatory strategies attempted (with notebox);Penetration/Aspiration during swallow;Delayed swallow initiation-pyriform sinuses;Pharyngeal residue - valleculae;Reduced tongue base retraction Pharyngeal Material enters airway, remains ABOVE vocal cords and not ejected out Pharyngeal- Nectar Straw Compensatory strategies attempted (with notebox);Pharyngeal residue - valleculae;Reduced tongue base retraction;Delayed swallow initiation-pyriform sinuses Pharyngeal Material does not enter airway Pharyngeal- Thin Teaspoon -- Pharyngeal -- Pharyngeal- Thin Cup Compensatory strategies attempted (with notebox);Pharyngeal residue - valleculae;Delayed swallow initiation-pyriform sinuses;Reduced tongue base retraction;Penetration/Aspiration during swallow Pharyngeal Material enters airway, passes BELOW cords and not ejected out despite cough attempt by patient Pharyngeal- Thin Straw Penetration/Aspiration during swallow;Pharyngeal residue -  valleculae;Delayed swallow initiation-pyriform sinuses;Reduced tongue base retraction;Compensatory strategies attempted (with notebox) Pharyngeal Material enters airway, passes BELOW cords and not ejected out despite cough attempt by patient Pharyngeal- Puree Pharyngeal residue - valleculae;Reduced tongue base retraction;Compensatory strategies attempted (with notebox);Pharyngeal residue - posterior pharnyx;Reduced pharyngeal peristalsis Pharyngeal -- Pharyngeal- Mechanical Soft -- Pharyngeal -- Pharyngeal- Regular Reduced tongue base retraction;Reduced pharyngeal peristalsis;Pharyngeal residue - valleculae;Pharyngeal residue - posterior pharnyx;Compensatory strategies attempted (with notebox) Pharyngeal -- Pharyngeal- Multi-consistency -- Pharyngeal -- Pharyngeal- Pill -- Pharyngeal -- Pharyngeal Comment --  CHL  IP CERVICAL ESOPHAGEAL PHASE 09/28/2017 Cervical Esophageal Phase WFL Pudding Teaspoon -- Pudding Cup -- Honey Teaspoon -- Honey Cup -- Nectar Teaspoon -- Nectar Cup -- Nectar Straw -- Thin Teaspoon -- Thin Cup -- Thin Straw -- Puree -- Mechanical Soft -- Regular -- Multi-consistency -- Pill -- Cervical Esophageal Comment -- No flowsheet data found. Maxcine Ham 09/28/2017, 4:47 PM  Note populated for [place] Jarrett, Student SLP Maxcine Ham, M.A. CCC-SLP [phone]              Assessment/Plan: Diagnosis: right MCA infarct 1. Does the need for close, 24 hr/day medical supervision in concert with the patient's rehab needs make it unreasonable for this patient to be served in a less intensive setting? Yes 2. Co-Morbidities requiring supervision/potential complications: CKD, chf, substance abuse 3. Due to bladder management, bowel management, safety, skin/wound care, disease management, medication administration, pain management and patient education, does the patient require 24 hr/day rehab nursing? Yes 4. Does the patient require coordinated care of a physician, rehab nurse, PT (1-2 hrs/day,  5 days/week), OT (1-2 hrs/day, 5 days/week) and SLP (1-2 hrs/day, 5 days/week) to address physical and functional deficits in the context of the above medical diagnosis(es)? Yes Addressing deficits in the following areas: balance, endurance, locomotion, strength, transferring, bowel/bladder control, bathing, dressing, feeding, grooming, toileting, cognition, speech and psychosocial support 5. Can the patient actively participate in an intensive therapy program of at least 3 hrs of therapy per day at least 5 days per week? Yes and Potentially 6. The potential for patient to make measurable gains while on inpatient rehab is good 7. Anticipated functional outcomes upon discharge from inpatient rehab are modified independent and supervision  with PT, modified independent, supervision and min assist with OT, supervision with SLP. 8. Estimated rehab length of stay to reach the above functional goals is: 11-16 days 9. Anticipated D/C setting: Home 10. Anticipated post D/C treatments: HH therapy and Outpatient therapy 11. Overall Rehab/Functional Prognosis: excellent  RECOMMENDATIONS: This patient's condition is appropriate for continued rehabilitative care in the following setting: CIR Patient has agreed to participate in recommended program. Yes Note that insurance prior authorization may be required for reimbursement for recommended care.  Comment: Rehab Admissions Coordinator to follow up.  Thanks,  Ranelle Oyster, MD, [place]    Jacquelynn Cree, PA-C 09/29/2017

## 2017-09-29 NOTE — Plan of Care (Signed)
Patient is slowly progressing.  He is quite irritable about Dysphagia diet and thickened liquids.  Impulsive and non-compliant which makes him a high fall risk.  Placing on low bed with pads for floor; bed alarm on for increased monitoring.  Patient instructed continuously to call for assist instead of attempting to get OOB himself.  Patient ambulated w/PT this AM   Consult to CIR has been placed for post-acute follow-up.  Will continue to monitor.

## 2017-09-29 NOTE — Progress Notes (Signed)
Patient agitated w/swallowing precautions; refuses all morning medications d/t agitation.  States "I'm not taking anything w/thickened liquids"; exhibits poor understanding of concerns regarding aspiration, states he eats and drinks whatever he wants at home.  Requests SLP to discuss w/him.  Patient also non-compliant w/fall precautions.  He has been instructed on multiple occasions that he is not to get OOB without assistance.  OT and PT for evaluations this AM.  Bed alarm on.  Will continue to monitor closely.

## 2017-09-29 NOTE — Progress Notes (Signed)
PROGRESS NOTE  Mark [name] WUJ:[id number] DOB: 03-Nov-1975 DOA: 09/28/2017 PCP: Patient, No Pcp Per  HPI/Recap of past 24 hours: Mark [name] is a 42 y.o. male with medical history significant for asthma, CHF with an EF of 20%, chronic kidney disease, cocaine and opiate abuse presents to the emergency department chief complaint altered mental status/frequent falls/worsening shortness of breath. Initial evaluation reveals acute respiratory distress requiring BiPAP an acute encephalopathy. He was evaluated by critical care medicine who recommended a medical admission. MRI revealed acute right MCA CVA. 2D echo 09/28/17 revealed LVEF 10-15%.  09/29/17: Seen and examined. Denies chest pain and dyspnea. Ambulating with PT with recommendation for CIR. No new complaints.    Assessment/Plan: Principal Problem:   Acute CVA (cerebrovascular accident) (HCC) Active Problems:   Tobacco abuse   Elevated troponin   NICM (nonischemic cardiomyopathy) (HCC)   CKD (chronic kidney disease) stage 2, GFR 60-89 ml/min   Cocaine abuse (HCC)   CHF (congestive heart failure) (HCC)  Acute ischemic CVA Right MCA -most likely 2/2 to vasospasm from cocaine abuse -permissive HTN -ASA, lipitor -neurology following and signed off -continue PT/OT -fall precaution -CIR evaluation  Chronic heart failure with reduced EF 10-15% -2D echo done 09/28/17 -previous HFREF 20-25% -09/29/17. Cardiology Dr Mayford Knife called and stated no changes in cardiac medications recommended to follow up with Dr Clifton James in the outpatient setting.   Elevated troponin likely from demand ischemia. -Initial troponin 0.36.  -Patient is asymptomatic. -troponin trended down  Chronic kidney disease stage III.  -Creatinine 1.5 on admission.  -Appears his baselines around 1.45. -Avoid nephrotoxins -Monitor urine output  Cocaine abuse.  -Urine drug screen positive for cocaine -polysubstance abuse counseling  Tobacco use -tobacco  cessation counseling offered   Code Status: full  Family Communication: none at bedside  Disposition Plan: CIR    Consultants:  Neurology  Cardiology called on 09/29/17 spoke with Dr Mayford Knife who stated that the patient should follow up with outpatient cardiologist Dr. Clifton James.  Procedures:  none  Antimicrobials:  none   DVT prophylaxis:  sq lovenox 40 mg dialy   Objective: Vitals:   09/28/17 2037 09/28/17 2325 09/29/17 0345 09/29/17 0735  BP:  120/90 115/90   Pulse:  (!) 57 (!) 51   Resp:  (!) 23 (!) 23   Temp:  97.8 F (36.6 C)    TempSrc:  Oral    SpO2: 99% 98% 96% 97%  Weight:      Height:        Intake/Output Summary (Last 24 hours) at 09/29/2017 0915 Last data filed at 09/28/2017 2328 Gross per 24 hour  Intake 240 ml  Output 450 ml  Net -210 ml   Filed Weights   09/28/17 1934  Weight: 74.8 kg (164 lb 14.5 oz)    Exam:   General:  42 yo AAM WD wN NAD A&O  x3   Cardiovascular: RRR no rubs or gallops   Respiratory: CTA no wheezes or rales  Abdomen: soft NT ND NBS x4  Musculoskeletal: mild weakness on left upper extremity compared to right  Skin: no rash  Psychiatry: mood appropriate for condition and setting    Data Reviewed: CBC: Recent Labs  Lab 09/28/17 0158 09/28/17 0235  WBC 9.0  --   NEUTROABS 5.5  --   HGB 12.2* 14.3  HCT 36.7* 42.0  MCV 78.6  --   PLT 176  --    Basic Metabolic Panel: Recent Labs  Lab 09/28/17 0158 09/28/17 0235  NA 136 137  K 4.4 4.4  CL 101 99*  CO2 26  --   GLUCOSE 122* 118*  BUN 21* 24*  CREATININE 1.57* 1.50*  CALCIUM 8.7*  --    GFR: Estimated Creatinine Clearance: 63.5 mL/min (A) (by C-G formula based on SCr of 1.5 mg/dL (H)). Liver Function Tests: Recent Labs  Lab 09/28/17 0158  AST 105*  ALT 250*  ALKPHOS 96  BILITOT 0.6  PROT 6.1*  ALBUMIN 3.1*   No results for input(s): LIPASE, AMYLASE in the last 168 hours. No results for input(s): AMMONIA in the last 168  hours. Coagulation Profile: No results for input(s): INR, PROTIME in the last 168 hours. Cardiac Enzymes: Recent Labs  Lab 09/28/17 1101 09/28/17 2011  TROPONINI 0.34* 0.24*   BNP (last 3 results) No results for input(s): PROBNP in the last 8760 hours. HbA1C: Recent Labs    09/28/17 0158  HGBA1C 6.4*   CBG: No results for input(s): GLUCAP in the last 168 hours. Lipid Profile: Recent Labs    09/28/17 0158  CHOL 113  HDL 30*  LDLCALC 68  TRIG 76  CHOLHDL 3.8   Thyroid Function Tests: No results for input(s): TSH, T4TOTAL, FREET4, T3FREE, THYROIDAB in the last 72 hours. Anemia Panel: No results for input(s): VITAMINB12, FOLATE, FERRITIN, TIBC, IRON, RETICCTPCT in the last 72 hours. Urine analysis:    Component Value Date/Time   COLORURINE YELLOW 09/28/2017 0615   APPEARANCEUR CLEAR 09/28/2017 0615   LABSPEC 1.019 09/28/2017 0615   PHURINE 6.0 09/28/2017 0615   GLUCOSEU NEGATIVE 09/28/2017 0615   HGBUR NEGATIVE 09/28/2017 0615   BILIRUBINUR NEGATIVE 09/28/2017 0615   KETONESUR NEGATIVE 09/28/2017 0615   PROTEINUR 100 (A) 09/28/2017 0615   NITRITE NEGATIVE 09/28/2017 0615   LEUKOCYTESUR NEGATIVE 09/28/2017 0615   Sepsis Labs: @LABRCNTIP (procalcitonin:4,lacticidven:4)  ) Recent Results (from the past 240 hour(s))  MRSA PCR Screening     Status: Abnormal   Collection Time: 09/28/17  8:11 PM  Result Value Ref Range Status   MRSA by PCR POSITIVE (A) NEGATIVE Final    Comment:        The GeneXpert MRSA Assay (FDA approved for NASAL specimens only), is one component of a comprehensive MRSA colonization surveillance program. It is not intended to diagnose MRSA infection nor to guide or monitor treatment for MRSA infections. RESULT CALLED TO, READ BACK BY AND VERIFIED WITHIleene Patrick RN 09/29/17 0013 JDW`       Studies: Mr Brain Wo Contrast  Result Date: 09/28/2017 CLINICAL DATA:  Asthma. Congestive heart failure. Polysubstance abuse including cocaine.  EXAM: MRI HEAD WITHOUT CONTRAST TECHNIQUE: Multiplanar, multiecho pulse sequences of the brain and surrounding structures were obtained without intravenous contrast. COMPARISON:  Head CT and CTA from earlier today FINDINGS: Brain: Moderate acute infarct involving the right insula, frontal operculum, and right corona radiata/centrum semiovale. No acute hemorrhage or hydrocephalus. No extra-axial collection or masslike finding. No pre-existing ischemic injury is noted. Vascular: Diminished flow voids and increased FLAIR signal within right M2 branches. Skull and upper cervical spine: No evidence of marrow lesion Sinuses/Orbits: Right maxillary sinusitis with mucosal thickening and secretions. The right maxillary sinus is completely opacified. 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 There is associated slow/absent flow within the associated right M2 branch. Electronically Signed   By: Marnee Spring M.D.   On: 09/28/2017 12:09   Dg Swallowing Func-speech Pathology  Result Date: 09/28/2017 Objective Swallowing Evaluation: Type of Study: MBS-Modified Barium Swallow Study  Patient Details Name: Mark [name] MRN: [id number] Date of Birth: [birth year] Today's Date: 09/28/2017 Time: SLP Start Time (ACUTE ONLY): 1425 -SLP Stop Time (ACUTE ONLY): 1447 SLP Time Calculation (min) (ACUTE ONLY): 22 min Past Medical History: Past Medical History: Diagnosis Date . Asthma  . Chronic systolic CHF (congestive heart failure) (HCC)  . Cigarette smoker  . History of echocardiogram   a. Echo 5/17 - EF 20-25%, severe diff HK, restrictive physiology, mild to mod MR, severe reduced RVSF, mod RVE, mild RAE, mod TR, PASP 48 mmHg . Hx of cardiac cath   a. LHC 5/17 - normal coronary arteries. PA 45/25, mean 33, PCWP mean 18 . NICM (nonischemic cardiomyopathy) (HCC)  . Substance abuse (HCC)   cocaine, marijuana Past Surgical History: Past Surgical History: Procedure Laterality Date . CARDIAC CATHETERIZATION N/A  01/05/2016  Procedure: Right/Left Heart Cath and Coronary Angiography;  Surgeon: Lennette Bihari, MD;  Location: [facility] INVASIVE CV LAB;  Service: Cardiovascular;  Laterality: N/A; . none   HPI: Deaunte C Hayesis a 42 y.o.malewho has apastmedical history of cocaine and marijuana abuse, chronic systolic CHF (non ischemic cardiomyopathy), tobacco abuse, asthma, who presented to the emergency room for evaluation of multiple falls and shortness of breath. Unknown last know normal. Pt reports inconsistent left sided weakness for over a year. MRI (2/7) revealed a moderate to large right MCA distribution infarct as described. There is associated slow/absent flow within the associated right M2 branch. CXR (2/7) Lungs remain grossly clear. Pt's swallow stroke screen failed secondary to inability to cough on demand.  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 Aspiration occured over the arytenoids likely related to impaired timing of airway closure and delayed swallow trigger at the pyriform sinuses. However, pt was able to clear only penetrates from laryngeal vestibule with cued and spontaneous throat clear/cough. Using chin tuck strategy with nectar thick liquids, the pt only had penetration, which cleared independently with subsequent swallow.  Chin tuck strategy was also used for solid trials to reduce moderate residue. Pt required Mod verbal cues to maintain chin tuck during trials and reduce impulsivity. Oral deficits (weak lingual manipulation, reduced posterior propulsion, lingual residue) resulted in decreased bolus cohesion and oral residue. Given observed aspiration/penetration and oral phase deficits  recommend Nectar Thick liquids and Dys 2 (chopped) diet with mandatory chin tuck with each swallow and general aspiration precautions. Pt's degree of dysphagia likley to improve with pharyngeal exercises, increased oral control, and improved mentation. SLP will follow to ensure minimal overt signs of difficulty with prescribed diet and readiness to advance diet/repeat tesing.   SLP Visit Diagnosis Dysphagia, oropharyngeal phase (R13.12) Attention and concentration deficit following -- Frontal lobe and executive function deficit following -- Impact on safety and function Moderate aspiration risk   CHL IP TREATMENT RECOMMENDATION 09/28/2017 Treatment Recommendations Therapy as outlined in treatment plan below   Prognosis 09/28/2017 Prognosis for Safe Diet Advancement Good Barriers to Reach Goals Motivation;Cognitive deficits;Behavior Barriers/Prognosis Comment -- CHL IP DIET RECOMMENDATION 09/28/2017 SLP Diet Recommendations Dysphagia 2 (Fine chop) solids;Nectar thick liquid Liquid Administration via Cup;Straw Medication Administration Crushed with puree Compensations Minimize environmental distractions;Slow rate;Small sips/bites;Monitor for anterior loss;Chin tuck Postural Changes Seated upright at 90 degrees   CHL IP OTHER RECOMMENDATIONS 09/28/2017 Recommended Consults --  Oral Care Recommendations Oral care BID Other Recommendations Order thickener from pharmacy;Prohibited food (jello, ice cream, thin soups);Remove water pitcher   CHL IP FOLLOW UP RECOMMENDATIONS 09/28/2017 Follow up Recommendations Other (comment)   CHL IP FREQUENCY AND DURATION 09/28/2017 Speech Therapy Frequency (ACUTE ONLY) min 2x/week Treatment Duration 2 weeks      CHL IP ORAL PHASE 09/28/2017 Oral Phase Impaired Oral - Pudding Teaspoon -- Oral - Pudding Cup -- Oral - Honey Teaspoon -- Oral - Honey Cup -- Oral - Nectar Teaspoon -- Oral - Nectar Cup Left anterior bolus loss;Decreased bolus cohesion Oral - Nectar Straw Left anterior bolus loss;Decreased  bolus cohesion Oral - Thin Teaspoon -- Oral - Thin Cup Left anterior bolus loss;Decreased bolus cohesion Oral - Thin Straw Left anterior bolus loss;Decreased bolus cohesion Oral - Puree Left anterior bolus loss;Weak lingual manipulation;Reduced posterior propulsion;Lingual/palatal residue Oral - Mech Soft -- Oral - Regular Left anterior bolus loss;Weak lingual manipulation;Reduced posterior propulsion;Lingual/palatal residue Oral - Multi-Consistency -- Oral - Pill -- Oral Phase - Comment --  CHL IP PHARYNGEAL PHASE 09/28/2017 Pharyngeal Phase Impaired Pharyngeal- Pudding Teaspoon -- Pharyngeal -- Pharyngeal- Pudding Cup -- Pharyngeal -- Pharyngeal- Honey Teaspoon -- Pharyngeal -- Pharyngeal- Honey Cup -- Pharyngeal -- Pharyngeal- Nectar Teaspoon -- Pharyngeal -- Pharyngeal- Nectar Cup Compensatory strategies attempted (with notebox);Penetration/Aspiration during swallow;Delayed swallow initiation-pyriform sinuses;Pharyngeal residue - valleculae;Reduced tongue base retraction Pharyngeal Material enters airway, remains ABOVE vocal cords and not ejected out Pharyngeal- Nectar Straw Compensatory strategies attempted (with notebox);Pharyngeal residue - valleculae;Reduced tongue base retraction;Delayed swallow initiation-pyriform sinuses Pharyngeal Material does not enter airway Pharyngeal- Thin Teaspoon -- Pharyngeal -- Pharyngeal- Thin Cup Compensatory strategies attempted (with notebox);Pharyngeal residue - valleculae;Delayed swallow initiation-pyriform sinuses;Reduced tongue base retraction;Penetration/Aspiration during swallow Pharyngeal Material enters airway, passes BELOW cords and not ejected out despite cough attempt by patient Pharyngeal- Thin Straw Penetration/Aspiration during swallow;Pharyngeal residue - valleculae;Delayed swallow initiation-pyriform sinuses;Reduced tongue base retraction;Compensatory strategies attempted (with notebox) Pharyngeal Material enters airway, passes BELOW cords and not ejected out  despite cough attempt by patient Pharyngeal- Puree Pharyngeal residue - valleculae;Reduced tongue base retraction;Compensatory strategies attempted (with notebox);Pharyngeal residue - posterior pharnyx;Reduced pharyngeal peristalsis Pharyngeal -- Pharyngeal- Mechanical Soft -- Pharyngeal -- Pharyngeal- Regular Reduced tongue base retraction;Reduced pharyngeal peristalsis;Pharyngeal residue - valleculae;Pharyngeal residue - posterior pharnyx;Compensatory strategies attempted (with notebox) Pharyngeal -- Pharyngeal- Multi-consistency -- Pharyngeal -- Pharyngeal- Pill -- Pharyngeal -- Pharyngeal Comment --  CHL IP CERVICAL ESOPHAGEAL PHASE 09/28/2017 Cervical Esophageal Phase WFL Pudding Teaspoon -- Pudding Cup -- Honey Teaspoon -- Honey Cup -- Nectar Teaspoon -- Nectar Cup -- Nectar Straw -- Thin Teaspoon -- Thin Cup -- Thin Straw -- Puree -- Mechanical Soft -- Regular -- Multi-consistency -- Pill -- Cervical Esophageal Comment -- No flowsheet data found. Maxcine Ham 09/28/2017, 4:47 PM  Note populated for [place] Jarrett, Student SLP Maxcine Ham, M.A. CCC-SLP [phone]              Scheduled Meds: .  stroke: mapping our early stages of recovery book   Does not apply Once  . aspirin  300 mg Rectal Daily   Or  . aspirin  325 mg Oral Daily  . atorvastatin  20 mg Oral q1800  . budesonide  0.25 mg Inhalation BID  . Chlorhexidine Gluconate Cloth  6 each Topical Q0600  . enoxaparin (LOVENOX) injection  40 mg Subcutaneous Q24H  . furosemide  40 mg Oral Daily  . mupirocin ointment  1 application Nasal BID    Continuous Infusions: . sodium chloride  LOS: 1 day     Darlin Drop, MD [facility] Pager [phone]  If 7PM-7AM, please contact night-coverage [email or web address] Password [email or web address] 09/29/2017, 9:15 AM

## 2017-09-29 NOTE — NC FL2 (Signed)
[place] MEDICAID FL2 LEVEL OF CARE SCREENING TOOL     IDENTIFICATION  Patient Name: Mark [name] Birthdate: 1976/04/19 Sex: male Admission Date (Current Location): 09/28/2017  [facility] and [place] Number:  Producer, television/film/video and Address:  [facility] [place], [address], [place], [place] [address]      Provider Number: [id number]  Attending Physician Name and Address:  Darlin Drop, DO  Relative Name and Phone Number:       Current Level of Care: Hospital Recommended Level of Care: Skilled Nursing Facility Prior Approval Number:    Date Approved/Denied:   PASRR Number: [id number]  Discharge Plan: SNF    Current Diagnoses: Patient Active Problem List   Diagnosis Date Noted  . Acute CVA (cerebrovascular accident) (HCC) 09/28/2017  . Stroke (HCC) 09/28/2017  . CHF (congestive heart failure) (HCC) 02/07/2016  . Acute on chronic systolic CHF (congestive heart failure) (HCC) 01/11/2016  . NICM (nonischemic cardiomyopathy) (HCC) 01/11/2016  . CKD (chronic kidney disease) stage 2, GFR 60-89 ml/min 01/11/2016  . Cocaine abuse (HCC) 01/11/2016  . Chest pain, pleuritic 01/03/2016  . Tobacco abuse 01/03/2016  . Asthma 01/03/2016  . Leg swelling 01/03/2016  . Tachycardia 01/03/2016  . Normocytic anemia 01/03/2016  . Elevated troponin 01/03/2016  . Right rib fracture 01/03/2016  . Chest pain     Orientation RESPIRATION BLADDER Height & Weight     Time, Situation, Place, Self  Normal Continent Weight: 164 lb 14.5 oz (74.8 kg) Height:  5\' 9"  (175.3 cm)  BEHAVIORAL SYMPTOMS/MOOD NEUROLOGICAL BOWEL NUTRITION STATUS      Continent (Please see d/c summary)  AMBULATORY STATUS COMMUNICATION OF NEEDS Skin   Limited Assist Verbally Normal                       Personal Care Assistance Level of Assistance  Bathing, Feeding, Dressing Bathing Assistance: Limited assistance Feeding assistance: Independent Dressing Assistance: Limited assistance    Functional Limitations Info  Sight, Hearing, Speech Sight Info: Adequate Hearing Info: Adequate Speech Info: Adequate    SPECIAL CARE FACTORS FREQUENCY  PT (By licensed PT), OT (By licensed OT)     PT Frequency: 4x OT Frequency: 4x            Contractures Contractures Info: Not present    Additional Factors Info  Code Status, Allergies Code Status Info: Full Code Allergies Info: Hydrocodone, Lisinopril, Prednisone, Penicillins           Current Medications (09/29/2017):  This is the current hospital active medication list Current Facility-Administered Medications  Medication Dose Route Frequency Provider Last Rate Last Dose  .  stroke: mapping our early stages of recovery book   Does not apply Once Black, Karen M, NP      . 0.9 %  sodium chloride infusion   Intravenous Continuous Black, Lesle Chris, NP      . acetaminophen (TYLENOL) tablet 650 mg  650 mg Oral Q4H PRN Gwenyth Bender, NP       Or  . acetaminophen (TYLENOL) solution 650 mg  650 mg Per Tube Q4H PRN Gwenyth Bender, NP       Or  . acetaminophen (TYLENOL) suppository 650 mg  650 mg Rectal Q4H PRN Gwenyth Bender, NP      . aspirin suppository 300 mg  300 mg Rectal Daily Black, Lesle Chris, NP       Or  . aspirin tablet 325 mg  325  mg Oral Daily Black, Lesle Chris, NP      . atorvastatin (LIPITOR) tablet 20 mg  20 mg Oral q1800 Costello, Mary A, NP      . benzonatate (TESSALON) capsule 100 mg  100 mg Oral TID PRN Toya Smothers M, NP      . budesonide (PULMICORT) nebulizer solution 0.25 mg  0.25 mg Inhalation BID Hammonds, Curt Jews, MD   0.25 mg at 09/29/17 0735  . Chlorhexidine Gluconate Cloth 2 % PADS 6 each  6 each Topical Q0600 Purohit, Shrey C, MD      . enoxaparin (LOVENOX) injection 40 mg  40 mg Subcutaneous Q24H Black, Karen M, NP   40 mg at 09/29/17 1504  . furosemide (LASIX) tablet 40 mg  40 mg Oral Daily Black, Lesle Chris, NP      . guaiFENesin-dextromethorphan (ROBITUSSIN DM) 100-10 MG/5ML syrup 5 mL  5 mL Oral  Q4H PRN Black, Lesle Chris, NP      . ipratropium-albuterol (DUONEB) 0.5-2.5 (3) MG/3ML nebulizer solution 3 mL  3 mL Nebulization Q4H PRN Purohit, Salli Quarry, MD   3 mL at 09/29/17 0741  . mupirocin ointment (BACTROBAN) 2 % 1 application  1 application Nasal BID Purohit, Salli Quarry, MD      . RESOURCE THICKENUP CLEAR   Oral PRN Purohit, Salli Quarry, MD      . senna-docusate (Senokot-S) tablet 1 tablet  1 tablet Oral QHS PRN Black, Lesle Chris, NP         Discharge Medications: Please see discharge summary for a list of discharge medications.  Relevant Imaging Results:  Relevant Lab Results:   Additional Information SSN: [id number]  Maree Krabbe, LCSW

## 2017-09-29 NOTE — Progress Notes (Signed)
Pt very upset about having liquids thickened, he states we are trying to make him "handicap". Tried to explain why it is important to thicken liquids to nectar thick at this time but pt does not want to hear "why" at this time.

## 2017-09-29 NOTE — Care Management Note (Signed)
Case Management Note  Patient Details  Name: MOYSES SCHLAFF MRN: [id number] Date of Birth: 05-Nov-1975  Subjective/Objective:      Pt admitted with CVA              Action/Plan:  PTA pt lived at home- independent- hx polysubstance abuse. Pt already has appt with [facility] for  Feb. 11 at 1pm.  [facility] letter given during Jan 2019 admission.  CIR recommended - CSW consulted for back up plan   Expected Discharge Date:  10/02/17               Expected Discharge Plan:  IP Rehab Facility  In-House Referral:     Discharge planning Services  CM Consult  Post Acute Care Choice:    Choice offered to:     DME Arranged:    DME Agency:     HH Arranged:    HH Agency:     Status of Service:     If discussed at [employer], dates discussed:    Additional Comments:  Cherylann Parr, RN 09/29/2017, 3:43 PM

## 2017-09-29 NOTE — Progress Notes (Signed)
Patient demands bread w/his dinner tray.  Diet has been changed to Dysphagia III diet w/nectar thickened liquids.  RN did override for a soft bun to eat w/his chopped chicken barbeque under supervision.  Will monitor intake closely.

## 2017-09-29 NOTE — Progress Notes (Signed)
  Speech Language Pathology Treatment: Dysphagia  Patient Details Name: Mark [name] MRN: [id number] DOB: 1975/10/30 Today's Date: 09/29/2017 Time: 8891-6945 SLP Time Calculation (min) (ACUTE ONLY): 27 min  Assessment / Plan / Recommendation Clinical Impression  Pt exhibited decreased awareness of his pharyngeal dysphagia stating multiple other reasons why he is having oropharyngeal difficulty with po's. Stating dislike of diet texture and thick liquids. SLP showed pt MBS video documenting silent aspiration with clinical explanation for recommendations. He did recall swallow precaution for chin tuck and followed with mild cues to perform. Overall moderate verbal/visual cues during session for smaller sips. Strong reflexive cough after one of 4 sips nectar indicative of possible airway intrusion which pt attributed to sausage this morning. No pocketing following trials of mechanical soft texture with functional manipulation. Encouraged pt to use tongue or finger to check for residual food.  Recommend upgrade to Dys 3 with close monitoring for clearance, continue nectar thick liquids. Allow ice chips intermittently (several times a day) with full supervision 1) after oral care 2) 30 min after meals 3) not with meds.Will follow for safety, efficiency and pharyngeal exercises.    HPI HPI: Jerion Danger Hayesis a 42 y.o.malewho has apastmedical history of cocaine and marijuana abuse, chronic systolic CHF (non ischemic cardiomyopathy), tobacco abuse, asthma, who presented to the emergency room for evaluation of multiple falls and shortness of breath. Unknown last know normal. Pt reports inconsistent left sided weakness for over a year. MRI (2/7) revealed a moderate to large right MCA distribution infarct as described. There is associated slow/absent flow within the associated right M2 branch. CXR (2/7) Lungs remain grossly clear. Pt's swallow stroke screen failed secondary to inability to cough on demand.       SLP Plan  Continue with current plan of care       Recommendations  Diet recommendations: Dysphagia 3 (mechanical soft);Nectar-thick liquid(ice chips intermittently full supervision) Liquids provided via: Cup Medication Administration: Crushed with puree Supervision: Patient able to self feed;Full supervision/cueing for compensatory strategies Compensations: Slow rate;Small sips/bites;Chin tuck(tuck with food and liquids) Postural Changes and/or Swallow Maneuvers: Seated upright 90 degrees;Chin tuck                Oral Care Recommendations: Oral care BID;Oral care prior to ice chip/H20 Follow up Recommendations: Inpatient Rehab SLP Visit Diagnosis: Dysphagia, oropharyngeal phase (R13.12) Plan: Continue with current plan of care       GO                Royce Macadamia 09/29/2017, 10:19 AM   Breck Coons Lonell Face.Ed [employer] [phone]

## 2017-09-29 NOTE — Progress Notes (Signed)
Inpatient Rehabilitation  Per PT request, patient was screened by Azelea Seguin for appropriateness for an Inpatient Acute Rehab consult.  At this time we are recommending an Inpatient Rehab consult.  Text paged MD to notify; please order if you are agreeable.    Dezman Granda, M.A., CCC/SLP Admission Coordinator  [facility]  Cell [phone]  

## 2017-09-29 NOTE — Evaluation (Signed)
Occupational Therapy Evaluation Patient Details Name: Mark [name] MRN: [id number] DOB: [birth year] Today's Date: 09/29/2017    History of Present Illness Pt is a 42 y.o. male admitted 09/28/17 for evaluation of multiple falls and SOB; found to have L-sided weakness. MRI showed moderate to large R MCA distribution infarct. Outside window for tpa. Pt also with dysphagia. PMH includes polysubstance abuse, HTN, CHF, asthma.     Clinical Impression   Pt presents with L-side inattention, decreased safety awareness, slowed processing, and an overall decrease in independence in ADL and functional transfers. PTA, pt independent in all ADL/IADL, works, and lives with fiance. Today, pt demonstrating L-side weakness, decrease in coordination fine and gross motor in LUE, and poor postural reactions, requiring intermittent minA to correct balance during transfers. Poor insight into deficits, requiring cues to attend to L-side throughout session. Pt at significant risk for falls. Feel pt would benefit from intensive CIR-level therapies in order to maximize safety and independence in ADL and functional transfers and return to independent lifestyle. Will follow acutely to address established goals. Next session to establish HEP for LUE and assess vision further.     Follow Up Recommendations  CIR;Supervision/Assistance - 24 hour    Equipment Recommendations  Other (comment)(defer to next venue)    Recommendations for Other Services Rehab consult     Precautions / Restrictions Precautions Precautions: Fall Precaution Comments: L-side inattention Restrictions Weight Bearing Restrictions: No      Mobility Bed Mobility               General bed mobility comments: Received sitting EOB  Transfers Overall transfer level: Needs assistance Equipment used: 1 person hand held assist Transfers: Sit to/from Stand Sit to Stand: Min assist;+2 safety/equipment         General transfer comment: MinA  for balance upon standing. Decreased awareness of L-side    Balance Overall balance assessment: Needs assistance Sitting-balance support: No upper extremity supported;Feet supported Sitting balance-Leahy Scale: Fair Sitting balance - Comments: sitting EOB with no back support     Standing balance-Leahy Scale: Fair Standing balance comment: Cannot tolerate challenge without assist to correct                           ADL either performed or assessed with clinical judgement   ADL Overall ADL's : Needs assistance/impaired Eating/Feeding: Supervision/ safety;Sitting;Minimal assistance Eating/Feeding Details (indicate cue type and reason): Pt able to use spoon to feed himself diet, inattention on L side of tray, vc to find objects, and min A for Bilateral tasks due to decreased fine motor Grooming: Moderate assistance;Standing Grooming Details (indicate cue type and reason): assist for standing balance during tasks requiring more attention, decreased coordination in LUE requires mod A Upper Body Bathing: Minimal assistance;Sitting   Lower Body Bathing: Minimal assistance;Sitting/lateral leans   Upper Body Dressing : Minimal assistance Upper Body Dressing Details (indicate cue type and reason): to don hospital gown Lower Body Dressing: Minimal assistance;Sit to/from stand Lower Body Dressing Details (indicate cue type and reason): Pt able to cross feet and bring up to knees to don socks - L hand discoordinated decreased strength in pinch/grasp and Pt requires min A  Toilet Transfer: Minimal assistance;+2 for physical assistance;Ambulation;Cueing for safety;Cueing for sequencing   Toileting- Clothing Manipulation and Hygiene: Moderate assistance;Sit to/from stand Toileting - Clothing Manipulation Details (indicate cue type and reason): mod A for balance Tub/ Shower Transfer: Minimal assistance;+2 for safety/equipment;Ambulation   Functional mobility  during ADLs: Minimal  assistance;+2 for safety/equipment;Cueing for safety(IN attention to the left side impacting environmental nav.)       Vision Baseline Vision/History: Wears glasses Wears Glasses: Reading only Patient Visual Report: No change from baseline Vision Assessment?: Vision impaired- to be further tested in functional context Additional Comments: not formally assessed this session - requires further invenstigation     Perception Perception Perception Tested?: Yes Perception Deficits: Inattention/neglect Inattention/Neglect: Does not attend to left side of body;Impaired- to be further tested in functional context   Praxis      Pertinent Vitals/Pain Pain Assessment: No/denies pain Faces Pain Scale: Hurts little more Pain Intervention(s): Monitored during session     Hand Dominance Right   Extremity/Trunk Assessment Upper Extremity Assessment Upper Extremity Assessment: LUE deficits/detail LUE Deficits / Details: grasp 3/5, able to touch back of head and lower back, slight drift, bicepts 4/5, triceps 3-/5, perception off target LUE Sensation: (reports WFL) LUE Coordination: decreased fine motor;decreased gross motor   Lower Extremity Assessment Lower Extremity Assessment: Defer to PT evaluation LLE Deficits / Details: L hip 3/5, L knee flex/ext 4/5, L ankle DF/PF 4/5 LLE Coordination: decreased fine motor   Cervical / Trunk Assessment Cervical / Trunk Assessment: Normal   Communication Communication Communication: No difficulties   Cognition Arousal/Alertness: Awake/alert Behavior During Therapy: Flat affect Overall Cognitive Status: No family/caregiver present to determine baseline cognitive functioning Area of Impairment: Attention;Following commands;Safety/judgement;Awareness;Problem solving                   Current Attention Level: Selective   Following Commands: Follows multi-step commands inconsistently Safety/Judgement: Decreased awareness of safety;Decreased  awareness of deficits Awareness: Emergent Problem Solving: Slow processing;Decreased initiation;Requires verbal cues;Requires tactile cues General Comments: Pt demonstrating L-side neglect throughout session, requiring cues to attend to L-side; also, delay noted when moving L-side (UE>LE). Pt upset regarding dysphagia 2 diet, perseverating on this during session. Decreased attention and easily distracted   General Comments       Exercises     Shoulder Instructions      Home Living Family/patient expects to be discharged to:: Private residence Living Arrangements: Spouse/significant other(fiance) Available Help at Discharge: Family;Friend(s);Available PRN/intermittently Type of Home: House Home Access: Stairs to enter [employer] of Steps: 2 Entrance Stairs-Rails: None Home Layout: One level     Bathroom Shower/Tub: Chief Strategy Officer: Standard     Home Equipment: None   Additional Comments: Lives with fiance who works      Prior Functioning/Environment Level of Independence: Independent        Comments: Works at [employer]        OT Problem List: Decreased strength;Decreased range of motion;Decreased activity tolerance;Impaired balance (sitting and/or standing);Impaired vision/perception;Decreased coordination;Decreased cognition;Decreased safety awareness;Decreased knowledge of use of DME or AE;Decreased knowledge of precautions;Impaired UE functional use      OT Treatment/Interventions: Self-care/ADL training;Therapeutic exercise;Neuromuscular education;Manual therapy;Therapeutic activities;Cognitive remediation/compensation;Patient/family education;Balance training;Visual/perceptual remediation/compensation    OT Goals(Current goals can be found in the care plan section) Acute Rehab OT Goals Patient Stated Goal: return to PLOF OT Goal Formulation: With patient Time For Goal Achievement: 10/13/17 Potential to Achieve Goals: Good ADL  Goals Pt Will Perform Grooming: with modified independence;standing Pt Will Perform Upper Body Bathing: with modified independence;standing Pt Will Perform Lower Body Bathing: with modified independence;sit to/from stand Pt Will Perform Lower Body Dressing: with modified independence;sit to/from stand Pt Will Transfer to Toilet: with modified independence;ambulating Pt Will Perform Toileting - Clothing Manipulation and hygiene: with  modified independence;sit to/from stand Pt/caregiver will Perform Home Exercise Program: Left upper extremity;Increased ROM;Increased strength;With written HEP provided  OT Frequency: Min 3X/week   Barriers to D/C:            Co-evaluation PT/OT/SLP Co-Evaluation/Treatment: Yes Reason for Co-Treatment: Necessary to address cognition/behavior during functional activity;For patient/therapist safety;To address functional/ADL transfers;Complexity of the patient's impairments (multi-system involvement) PT goals addressed during session: Mobility/safety with mobility;Balance OT goals addressed during session: ADL's and self-care;Strengthening/ROM      AM-PAC PT "6 Clicks" Daily Activity     Outcome Measure Help from another person eating meals?: A Little Help from another person taking care of personal grooming?: A Little Help from another person toileting, which includes using toliet, bedpan, or urinal?: A Little Help from another person bathing (including washing, rinsing, drying)?: A Little Help from another person to put on and taking off regular upper body clothing?: A Little Help from another person to put on and taking off regular lower body clothing?: A Little 6 Click Score: 18   End of Session Equipment Utilized During Treatment: Gait belt Nurse Communication: Mobility status  Activity Tolerance: Patient tolerated treatment well Patient left: in chair;with call bell/phone within reach;with chair alarm set;Other (comment)(SLP in the room)  OT Visit  Diagnosis: Unsteadiness on feet (R26.81);Other abnormalities of gait and mobility (R26.89);Ataxia, unspecified (R27.0);Other symptoms and signs involving the nervous system (R29.898);Hemiplegia and hemiparesis Hemiplegia - Right/Left: Left Hemiplegia - dominant/non-dominant: Non-Dominant Hemiplegia - caused by: Cerebral infarction                Time: 1779-3903 OT Time Calculation (min): 32 min Charges:  OT General Charges $OT Visit: 1 Visit OT Evaluation $OT Eval Moderate Complexity: 1 Mod G-Codes:     Sherryl Manges OTR/L [phone]  Evern Bio Jazsmine Macari 09/29/2017, 12:34 PM

## 2017-09-30 DIAGNOSIS — R7989 Other specified abnormal findings of blood chemistry: Secondary | ICD-10-CM

## 2017-09-30 DIAGNOSIS — R262 Difficulty in walking, not elsewhere classified: Secondary | ICD-10-CM

## 2017-09-30 DIAGNOSIS — I6350 Cerebral infarction due to unspecified occlusion or stenosis of unspecified cerebral artery: Secondary | ICD-10-CM

## 2017-09-30 DIAGNOSIS — F10230 Alcohol dependence with withdrawal, uncomplicated: Secondary | ICD-10-CM

## 2017-09-30 DIAGNOSIS — I509 Heart failure, unspecified: Secondary | ICD-10-CM

## 2017-09-30 DIAGNOSIS — F10129 Alcohol abuse with intoxication, unspecified: Secondary | ICD-10-CM

## 2017-09-30 DIAGNOSIS — F17200 Nicotine dependence, unspecified, uncomplicated: Secondary | ICD-10-CM

## 2017-09-30 DIAGNOSIS — G9341 Metabolic encephalopathy: Secondary | ICD-10-CM

## 2017-09-30 LAB — BASIC METABOLIC PANEL
ANION GAP: 8 (ref 5–15)
BUN: 28 mg/dL — ABNORMAL HIGH (ref 6–20)
CALCIUM: 8.6 mg/dL — AB (ref 8.9–10.3)
CO2: 25 mmol/L (ref 22–32)
Chloride: 102 mmol/L (ref 101–111)
Creatinine, Ser: 1.49 mg/dL — ABNORMAL HIGH (ref 0.61–1.24)
GFR calc Af Amer: >60 mL/min (ref >60)
GFR, EST NON AFRICAN AMERICAN: 56 mL/min — AB (ref >60)
GLUCOSE: 148 mg/dL — AB (ref 65–99)
Potassium: 4.5 mmol/L (ref 3.5–5.1)
SODIUM: 135 mmol/L (ref 135–145)

## 2017-09-30 MED ORDER — DIAZEPAM 5 MG/ML IJ SOLN: 2.5000 mg | Freq: Three times a day (TID) | INTRAMUSCULAR | Status: DC | PRN

## 2017-09-30 MED ORDER — FOLIC ACID 1 MG PO TABS
1.0000 mg | ORAL_TABLET | Freq: Every day | ORAL | Status: DC
  Administered 2017-09-30 – 2017-10-01 (×2): 1 mg via ORAL
  Filled 2017-09-30 (×2): qty 1

## 2017-09-30 MED ORDER — VITAMIN B-1 100 MG PO TABS
100.0000 mg | ORAL_TABLET | Freq: Every day | ORAL | Status: DC
  Administered 2017-09-30 – 2017-10-01 (×2): 100 mg via ORAL
  Filled 2017-09-30 (×2): qty 1

## 2017-09-30 NOTE — Progress Notes (Addendum)
PROGRESS NOTE  Mark [name] ZOX:[id number] DOB: [birth year] DOA: 09/28/2017 PCP: Patient, No Pcp Per  HPI/Recap of past 24 hours: Mark [name] is a 42 y.o. male with medical history significant for asthma, CHF with an EF of 20%, chronic kidney disease, cocaine and opiate abuse presents to the emergency department chief complaint altered mental status/frequent falls/worsening shortness of breath. Initial evaluation reveals acute respiratory distress requiring BiPAP and acute encephalopathy. He was evaluated by critical care medicine who recommended a medical admission. MRI revealed acute right MCA CVA. 2D echo 09/28/17 revealed LVEF 10-15%. 09/29/17: Ambulated with PT with recommendation for CIR. No new complaints.  09/30/17: Seen and examined at his bedside. Denies chest pain, palpitations, and dyspnea.   Assessment/Plan: Principal Problem:   Acute CVA (cerebrovascular accident) (HCC) Active Problems:   Tobacco abuse   Elevated troponin   NICM (nonischemic cardiomyopathy) (HCC)   CKD (chronic kidney disease) stage 2, GFR 60-89 ml/min   Cocaine abuse (HCC)   CHF (congestive heart failure) (HCC)  Acute ischemic CVA Right MCA -most likely 2/2 to vasospasm from cocaine abuse -permissive HTN -09/30/17 continue ASA, lipitor -neurology following and signed off -09/30/17 continue PT/OT -fall precaution -CIR evaluation  Acute metabolic encephalopathy 2/2 to acute hypoxic respiratory failure requiring non invasive mechanical ventilation BIPAP, resolved  Acute hypoxic respiratory failure 2/2 to bronchiospasm vs others due to cocaine intoxication -resolved -saturating well on ambient air  Chronic heart failure with reduced EF 10-15% -2D echo done 09/28/17 -previous HFREF 20-25% -09/29/17. Cardiology Dr Mayford Knife called and stated no changes in cardiac medications recommended to follow up with Dr Clifton James in the outpatient setting.  -continue current management  Elevated troponin likely from demand  ischemia. -Initial troponin 0.36.  -Patient is asymptomatic. -troponin trended down  Chronic kidney disease stage III.  -Creatinine 1.5 on admission.  -Appears his baselines around 1.45. -Avoid nephrotoxins -09/30/17 continue to Monitor urine output  Cocaine abuse.  -Urine drug screen positive for cocaine -polysubstance abuse counseling  Tobacco use -tobacco cessation counseling offered  Ambulatory dysfunction post acute CVA -continue PT -Consult to inpatient rehab  Chronic alcohol use -CIWA protocol   Code Status: full  Family Communication: none at bedside  Disposition Plan: CIR    Consultants:  Neurology  Cardiology called on 09/29/17 spoke with Dr Mayford Knife. Patient will follow up with his outpatient cardiologist Dr. Clifton James.  Procedures:  none  Antimicrobials:  none   DVT prophylaxis:  sq lovenox 40 mg dialy   Objective: Vitals:   09/30/17 0200 09/30/17 0600 09/30/17 0919 09/30/17 0928  BP: 118/80 117/90 119/70   Pulse: (!) 58 (!) 59 (!) 53   Resp: 20  20   Temp: 98 F (36.7 C) 98.2 F (36.8 C) 98.4 F (36.9 C)   TempSrc: Oral Oral Oral   SpO2: 97% 97% 100% 100%  Weight:      Height:        Intake/Output Summary (Last 24 hours) at 09/30/2017 1204 Last data filed at 09/30/2017 1059 Gross per 24 hour  Intake 240 ml  Output 150 ml  Net 90 ml   Filed Weights   09/28/17 1934 09/29/17 2200  Weight: 74.8 kg (164 lb 14.5 oz) 75 kg (165 lb 5.5 oz)    Exam: 09/30/17 patient seen and examined no change from prior exam   General:  42 yo AAM WD wN NAD A&O  x3   Cardiovascular: RRR no rubs or gallops   Respiratory: CTA no wheezes or rales  Abdomen: soft NT ND NBS x4  Musculoskeletal: mild weakness on left upper extremity compared to right  Skin: no rash  Psychiatry: mood appropriate for condition and setting    Data Reviewed: CBC: Recent Labs  Lab 09/28/17 0158 09/28/17 0235  WBC 9.0  --   NEUTROABS 5.5  --   HGB 12.2* 14.3  HCT  36.7* 42.0  MCV 78.6  --   PLT 176  --    Basic Metabolic Panel: Recent Labs  Lab 09/28/17 0158 09/28/17 0235  NA 136 137  K 4.4 4.4  CL 101 99*  CO2 26  --   GLUCOSE 122* 118*  BUN 21* 24*  CREATININE 1.57* 1.50*  CALCIUM 8.7*  --    GFR: Estimated Creatinine Clearance: 61.4 mL/min (A) (by C-G formula based on SCr of 1.5 mg/dL (H)). Liver Function Tests: Recent Labs  Lab 09/28/17 0158  AST 105*  ALT 250*  ALKPHOS 96  BILITOT 0.6  PROT 6.1*  ALBUMIN 3.1*   No results for input(s): LIPASE, AMYLASE in the last 168 hours. No results for input(s): AMMONIA in the last 168 hours. Coagulation Profile: No results for input(s): INR, PROTIME in the last 168 hours. Cardiac Enzymes: Recent Labs  Lab 09/28/17 1101 09/28/17 2011  TROPONINI 0.34* 0.24*   BNP (last 3 results) No results for input(s): PROBNP in the last 8760 hours. HbA1C: Recent Labs    09/28/17 0158  HGBA1C 6.4*   CBG: No results for input(s): GLUCAP in the last 168 hours. Lipid Profile: Recent Labs    09/28/17 0158  CHOL 113  HDL 30*  LDLCALC 68  TRIG 76  CHOLHDL 3.8   Thyroid Function Tests: No results for input(s): TSH, T4TOTAL, FREET4, T3FREE, THYROIDAB in the last 72 hours. Anemia Panel: No results for input(s): VITAMINB12, FOLATE, FERRITIN, TIBC, IRON, RETICCTPCT in the last 72 hours. Urine analysis:    Component Value Date/Time   COLORURINE YELLOW 09/28/2017 0615   APPEARANCEUR CLEAR 09/28/2017 0615   LABSPEC 1.019 09/28/2017 0615   PHURINE 6.0 09/28/2017 0615   GLUCOSEU NEGATIVE 09/28/2017 0615   HGBUR NEGATIVE 09/28/2017 0615   BILIRUBINUR NEGATIVE 09/28/2017 0615   KETONESUR NEGATIVE 09/28/2017 0615   PROTEINUR 100 (A) 09/28/2017 0615   NITRITE NEGATIVE 09/28/2017 0615   LEUKOCYTESUR NEGATIVE 09/28/2017 0615   Sepsis Labs: @LABRCNTIP (procalcitonin:4,lacticidven:4)  ) Recent Results (from the past 240 hour(s))  MRSA PCR Screening     Status: Abnormal   Collection Time:  09/28/17  8:11 PM  Result Value Ref Range Status   MRSA by PCR POSITIVE (A) NEGATIVE Final    Comment:        The GeneXpert MRSA Assay (FDA approved for NASAL specimens only), is one component of a comprehensive MRSA colonization surveillance program. It is not intended to diagnose MRSA infection nor to guide or monitor treatment for MRSA infections. RESULT CALLED TO, READ BACK BY AND VERIFIED WITHElvera Lennox [facility] RN 09/29/17 0013 JDW`       Studies: No results found.  Scheduled Meds: .  stroke: mapping our early stages of recovery book   Does not apply Once  . aspirin  300 mg Rectal Daily   Or  . aspirin  325 mg Oral Daily  . atorvastatin  20 mg Oral q1800  . budesonide  0.25 mg Inhalation BID  . Chlorhexidine Gluconate Cloth  6 each Topical Q0600  . enoxaparin (LOVENOX) injection  40 mg Subcutaneous Q24H  . furosemide  40 mg Oral Daily  .  mupirocin ointment  1 application Nasal BID    Continuous Infusions: . sodium chloride       LOS: 2 days     Darlin Drop, MD [facility] Pager [phone]  If 7PM-7AM, please contact night-coverage [email or web address] Password [email or web address] 09/30/2017, 12:04 PM

## 2017-09-30 NOTE — Evaluation (Signed)
Speech Language Pathology Evaluation Patient Details Name: Mark [name] MRN: [id number] DOB: 01-Nov-1975 Today's Date: 09/30/2017 Time: 1320-1340 SLP Time Calculation (min) (ACUTE ONLY): 20 min  Problem List:  Patient Active Problem List   Diagnosis Date Noted  . Acute CVA (cerebrovascular accident) (HCC) 09/28/2017  . Stroke (HCC) 09/28/2017  . CHF (congestive heart failure) (HCC) 02/07/2016  . Acute on chronic systolic CHF (congestive heart failure) (HCC) 01/11/2016  . NICM (nonischemic cardiomyopathy) (HCC) 01/11/2016  . CKD (chronic kidney disease) stage 2, GFR 60-89 ml/min 01/11/2016  . Cocaine abuse (HCC) 01/11/2016  . Chest pain, pleuritic 01/03/2016  . Tobacco abuse 01/03/2016  . Asthma 01/03/2016  . Leg swelling 01/03/2016  . Tachycardia 01/03/2016  . Normocytic anemia 01/03/2016  . Elevated troponin 01/03/2016  . Right rib fracture 01/03/2016  . Chest pain    Past Medical History:  Past Medical History:  Diagnosis Date  . Asthma   . Chronic systolic CHF (congestive heart failure) (HCC)   . Cigarette smoker   . History of echocardiogram    a. Echo 5/17 - EF 20-25%, severe diff HK, restrictive physiology, mild to mod MR, severe reduced RVSF, mod RVE, mild RAE, mod TR, PASP 48 mmHg  . Hx of cardiac cath    a. LHC 5/17 - normal coronary arteries. PA 45/25, mean 33, PCWP mean 18  . NICM (nonischemic cardiomyopathy) (HCC)   . Substance abuse (HCC)    cocaine, marijuana   Past Surgical History:  Past Surgical History:  Procedure Laterality Date  . CARDIAC CATHETERIZATION N/A 01/05/2016   Procedure: Right/Left Heart Cath and Coronary Angiography;  Surgeon: Lennette Bihari, MD;  Location: [facility] INVASIVE CV LAB;  Service: Cardiovascular;  Laterality: N/A;  . none     HPI:  Mark [name] a 42 y.o.malewho has apastmedical history of cocaine and marijuana abuse, chronic systolic CHF (non ischemic cardiomyopathy), tobacco abuse, asthma, who presented to the emergency  room for evaluation of multiple falls and shortness of breath. Unknown last know normal. Pt reports inconsistent left sided weakness for over a year. MRI (2/7) revealed a moderate to large right MCA distribution infarct as described. There is associated slow/absent flow within the associated right M2 branch. CXR (2/7) Lungs remain grossly clear. Pt's swallow stroke screen failed secondary to inability to cough on demand.   Assessment / Plan / Recommendation Clinical Impression  Patient presents with a moderate cognitive-linguistic and mild speech impairment consisting of decreased safety awareness, decreased reasoning and ability to demonstrate adequate planning and problem solving, decreased awareness of deficits and the impact these deficits have on his ability to complete ADL's. Patient also presented with a very flat affect, even when he was telling SLP about his fiance and the wedding they have been planning this October. He exhibited left visual field neglect and did not make appropriate eye contact with clincian when talking, even when clinician was on his right side. No family present during this evaluation and so unable to determine his baseline in cognitive-linguistic function. Patient exhbits a mild flaccid dysarthria, which impacts his speech intelligibility, but SLP was able to understand what he was saying at conversational level even when context was not known.     SLP Assessment  SLP Recommendation/Assessment: Patient needs continued Speech Lanaguage Pathology Services SLP Visit Diagnosis: Cognitive communication deficit (R41.841);Dysarthria and anarthria (R47.1)    Follow Up Recommendations  Inpatient Rehab    Frequency and Duration min 2x/week  2 weeks      SLP  Evaluation Cognition  Overall Cognitive Status: No family/caregiver present to determine baseline cognitive functioning Orientation Level: Oriented X4 Attention: Selective Selective Attention: Impaired Selective  Attention Impairment: Verbal complex;Functional basic Memory: Impaired Memory Impairment: Retrieval deficit Awareness: Impaired Awareness Impairment: Emergent impairment Problem Solving: Impaired Problem Solving Impairment: Verbal complex Executive Function: Reasoning Reasoning: Impaired Reasoning Impairment: Verbal complex Safety/Judgment: Impaired       Comprehension  Auditory Comprehension Overall Auditory Comprehension: Appears within functional limits for tasks assessed Yes/No Questions: Within Functional Limits Commands: Within Functional Limits Visual Recognition/Discrimination Discrimination: Not tested Reading Comprehension Reading Status: Not tested    Expression Expression Primary Mode of Expression: Verbal Verbal Expression Overall Verbal Expression: Appears within functional limits for tasks assessed   Oral / Motor  Oral Motor/Sensory Function Overall Oral Motor/Sensory Function: Moderate impairment Facial ROM: Reduced left Facial Symmetry: Abnormal symmetry left Facial Sensation: Reduced left Lingual ROM: Reduced left Lingual Symmetry: Abnormal symmetry left Motor Speech Overall Motor Speech: Impaired Respiration: Impaired Level of Impairment: Conversation Phonation: Normal Resonance: Within functional limits Articulation: Impaired Level of Impairment: Conversation Intelligibility: Intelligibility reduced Word: 75-100% accurate Phrase: 75-100% accurate Sentence: 75-100% accurate Conversation: 75-100% accurate Motor Planning: Witnin functional limits   GO                    Angela Nevin, MA, CCC-SLP 09/30/17 5:24 PM

## 2017-10-01 DIAGNOSIS — R471 Dysarthria and anarthria: Secondary | ICD-10-CM

## 2017-10-01 NOTE — Care Management Note (Signed)
Case Management Note  Patient Details  Name: Mark [name] MRN: [id number] Date of Birth: 1976/05/21  Subjective/Objective:  42 y.o. M who had been seen by CM last week and assisted with meds and appt with [facility] on Oct 02, 2017. MD would like for pt to have HHPT/OT/RN however he would be charity and also has no PCP of record. Will see [facility] on Monday Feb 11 and Jermaine, rep for [facility] assures me [facility] can be arranged from there. CM will send In basket message to Robyne Peers to assist with this process.                   Action/Plan:CM will sign off for now but will be available should additional discharge needs arise or disposition change.    Expected Discharge Date:  10/02/17               Expected Discharge Plan:  Home w Home Health Services  In-House Referral:     Discharge planning Services  CM Consult  Post Acute Care Choice:  Home Health(Charity) Choice offered to:  Patient  DME Arranged:    DME Agency:     HH Arranged:  RN, PT, OT HH Agency:  [facility]  Status of Service:  Completed, signed off  If discussed at Long Length of Stay Meetings, dates discussed:    Additional Comments:  Yvone Neu, RN 10/01/2017, 2:01 PM

## 2017-10-01 NOTE — Progress Notes (Signed)
PROGRESS NOTE  Mark [name]:[id number] DOB: Sep 21, 1975 DOA: 09/28/2017 PCP: Patient, No Pcp Per  HPI/Recap of past 24 hours: Mark [name] is a 42 y.o. male with medical history significant for asthma, CHF with an EF of 20%, chronic kidney disease, cocaine and opiate abuse presents to the emergency department chief complaint altered mental status/frequent falls/worsening shortness of breath. Initial evaluation reveals acute respiratory distress requiring BiPAP and acute encephalopathy. He was evaluated by critical care medicine who recommended a medical admission. MRI revealed acute right MCA CVA. 2D echo 09/28/17 revealed LVEF 10-15%. 09/29/17: Ambulated with PT with recommendation for CIR. No new complaints.  10/01/17: Seen and examined at his bedside. Patient kis adamant about going home although PT recommends inpatient rehab.  Spoke to Dr. Eden Emms from cardiology who stated he will let his office know that the patient will need a follow up appointment post hospitalization. Denies chest pain, palpitations, and dyspnea. makes minimal eye contact and minimally interactive.   Assessment/Plan: Principal Problem:   Acute CVA (cerebrovascular accident) (HCC) Active Problems:   Tobacco abuse   Elevated troponin   NICM (nonischemic cardiomyopathy) (HCC)   CKD (chronic kidney disease) stage 2, GFR 60-89 ml/min   Cocaine abuse (HCC)   CHF (congestive heart failure) (HCC)  Acute ischemic CVA Right MCA -most likely 2/2 to vasospasm from cocaine abuse -permissive HTN -10/01/17 continue ASA, lipitor -neurology following and signed off -10/01/17 continue PT/OT -fall precaution -CIR evaluation  Acute metabolic encephalopathy 2/2 to acute hypoxic respiratory failure requiring non invasive mechanical ventilation BIPAP, resolved  Acute hypoxic respiratory failure 2/2 to bronchiospasm vs others due to cocaine intoxication -resolved -saturating well on ambient air  Chronic heart failure with  reduced EF 10-15% -2D echo done 09/28/17 -previous HFREF 20-25% -09/29/17. Cardiology Dr Mayford Knife called and stated no changes in cardiac medications recommended to follow up with Dr Clifton James in the outpatient setting.  -10/01/17 continue current management  Elevated troponin likely from demand ischemia. -Initial troponin 0.36.  -Patient is asymptomatic. -troponin trended down  Chronic kidney disease stage III.  -Creatinine 1.5 on admission.  -Appears his baselines around 1.45. -Avoid nephrotoxins -10/01/17 continue to Monitor urine output  Cocaine abuse.  -Urine drug screen positive for cocaine -polysubstance abuse counseling  Tobacco use -tobacco cessation counseling offered  Ambulatory dysfunction post acute CVA -continue PT -Consult to inpatient rehab  Chronic alcohol use -CIWA protocol  Ambulatory dysfuncton post acute CVA -continue PT/OT -PT recommends inpatient rehab  Speech impairment s/p acute CVA -speech therapist following -will need to follow up with Language Pathology services   Code Status: full  Family Communication: none at bedside  Disposition Plan: CIR    Consultants:  Neurology  Cardiology called on 09/29/17 spoke with Dr Mayford Knife. Patient will follow up with his outpatient cardiologist Dr. Clifton James.  10/01/17 Talked to Dr Eden Emms of cardiology on the phone and his office will call the patient for a follow up appointment post hospitalization.  Procedures:  none  Antimicrobials:  none   DVT prophylaxis:  sq lovenox 40 mg dialy   Objective: Vitals:   10/01/17 0621 10/01/17 1037 10/01/17 1039 10/01/17 1338  BP: 126/89 119/79  (!) 145/83  Pulse: (!) 54 61  (!) 52  Resp: 20 19  19   Temp: 98.2 F (36.8 C) 97.7 F (36.5 C)  97.8 F (36.6 C)  TempSrc: Axillary Oral  Oral  SpO2: 99% 97% 96% 98%  Weight:      Height:  Intake/Output Summary (Last 24 hours) at 10/01/2017 1416 Last data filed at 10/01/2017 6168 Gross per 24 hour    Intake -  Output 650 ml  Net -650 ml   Filed Weights   09/28/17 1934 09/29/17 2200  Weight: 74.8 kg (164 lb 14.5 oz) 75 kg (165 lb 5.5 oz)    Exam: 10/01/17 patient seen and examined no change from prior exam   General:  42 yo AAM WD wN NAD A&O  x3   Cardiovascular: RRR no rubs or gallops   Respiratory: CTA no wheezes or rales  Abdomen: soft NT ND NBS x4  Musculoskeletal: mild weakness on left upper extremity compared to right 3/5 strength.  Skin: no rash  Psychiatry: mood appropriate for condition and setting    Data Reviewed: CBC: Recent Labs  Lab 09/28/17 0158 09/28/17 0235  WBC 9.0  --   NEUTROABS 5.5  --   HGB 12.2* 14.3  HCT 36.7* 42.0  MCV 78.6  --   PLT 176  --    Basic Metabolic Panel: Recent Labs  Lab 09/28/17 0158 09/28/17 0235 09/30/17 1241  NA 136 137 135  K 4.4 4.4 4.5  CL 101 99* 102  CO2 26  --  25  GLUCOSE 122* 118* 148*  BUN 21* 24* 28*  CREATININE 1.57* 1.50* 1.49*  CALCIUM 8.7*  --  8.6*   GFR: Estimated Creatinine Clearance: 61.8 mL/min (A) (by C-G formula based on SCr of 1.49 mg/dL (H)). Liver Function Tests: Recent Labs  Lab 09/28/17 0158  AST 105*  ALT 250*  ALKPHOS 96  BILITOT 0.6  PROT 6.1*  ALBUMIN 3.1*   No results for input(s): LIPASE, AMYLASE in the last 168 hours. No results for input(s): AMMONIA in the last 168 hours. Coagulation Profile: No results for input(s): INR, PROTIME in the last 168 hours. Cardiac Enzymes: Recent Labs  Lab 09/28/17 1101 09/28/17 2011  TROPONINI 0.34* 0.24*   BNP (last 3 results) No results for input(s): PROBNP in the last 8760 hours. HbA1C: No results for input(s): HGBA1C in the last 72 hours. CBG: No results for input(s): GLUCAP in the last 168 hours. Lipid Profile: No results for input(s): CHOL, HDL, LDLCALC, TRIG, CHOLHDL, LDLDIRECT in the last 72 hours. Thyroid Function Tests: No results for input(s): TSH, T4TOTAL, FREET4, T3FREE, THYROIDAB in the last 72  hours. Anemia Panel: No results for input(s): VITAMINB12, FOLATE, FERRITIN, TIBC, IRON, RETICCTPCT in the last 72 hours. Urine analysis:    Component Value Date/Time   COLORURINE YELLOW 09/28/2017 0615   APPEARANCEUR CLEAR 09/28/2017 0615   LABSPEC 1.019 09/28/2017 0615   PHURINE 6.0 09/28/2017 0615   GLUCOSEU NEGATIVE 09/28/2017 0615   HGBUR NEGATIVE 09/28/2017 0615   BILIRUBINUR NEGATIVE 09/28/2017 0615   KETONESUR NEGATIVE 09/28/2017 0615   PROTEINUR 100 (A) 09/28/2017 0615   NITRITE NEGATIVE 09/28/2017 0615   LEUKOCYTESUR NEGATIVE 09/28/2017 0615   Sepsis Labs: @LABRCNTIP (procalcitonin:4,lacticidven:4)  ) Recent Results (from the past 240 hour(s))  MRSA PCR Screening     Status: Abnormal   Collection Time: 09/28/17  8:11 PM  Result Value Ref Range Status   MRSA by PCR POSITIVE (A) NEGATIVE Final    Comment:        The GeneXpert MRSA Assay (FDA approved for NASAL specimens only), is one component of a comprehensive MRSA colonization surveillance program. It is not intended to diagnose MRSA infection nor to guide or monitor treatment for MRSA infections. RESULT CALLED TO, READ BACK BY AND VERIFIED WITH:  Elvera Lennox [facility] RN 09/29/17 0013 JDW`       Studies: No results found.  Scheduled Meds: .  stroke: mapping our early stages of recovery book   Does not apply Once  . aspirin  300 mg Rectal Daily   Or  . aspirin  325 mg Oral Daily  . atorvastatin  20 mg Oral q1800  . budesonide  0.25 mg Inhalation BID  . Chlorhexidine Gluconate Cloth  6 each Topical Q0600  . enoxaparin (LOVENOX) injection  40 mg Subcutaneous Q24H  . folic acid  1 mg Oral Daily  . furosemide  40 mg Oral Daily  . mupirocin ointment  1 application Nasal BID  . thiamine  100 mg Oral Daily    Continuous Infusions: . sodium chloride       LOS: 3 days     Darlin Drop, MD [facility] Pager [phone]  If 7PM-7AM, please contact night-coverage [email or web address] Password [email or web address] 10/01/2017,  2:16 PM

## 2017-10-02 ENCOUNTER — Ambulatory Visit: Payer: Self-pay | Admitting: Family Medicine

## 2017-10-02 ENCOUNTER — Telehealth: Payer: Self-pay

## 2017-10-02 NOTE — Telephone Encounter (Signed)
Message received from Marianna Fuss, RN CM requesting medical follow up for the patient in the community and a home health referral after establishing care with a PCP.    Informed her that the patient has an appointment to establish care at the [facility] on 10/12/17. Also informed her that the patient has no insurance, charity home health care can't be set up from the community.  It needs to be set up prior to his discharge from the hospital.

## 2017-10-02 NOTE — ED Provider Notes (Addendum)
[name] EMERGENCY DEPARTMENT Provider Note   CSN: [id number] Arrival date & time: 09/09/17  1205     History   Chief Complaint Chief Complaint  Patient presents with  . Cough    HPI Mark VAILES is a 42 y.o. male.  Patient presents with epigastric pain and dyspnea for the past 2 days.  He has had a prodromal upper respiratory infection and believes these sxs are associated with his lung disease.  No frank chest pain, hemoptysis, fever, sweats, chills.  Review of systems positive for green diarrhea.  Past medical history is complicated and includes asthma, chronic systolic heart failure, cigarette smoking, non-ST ischemic cardiac myopathy, substance abuse.  Cardiac catheterization on 01/05/17 revealed normal coronary arteries.  Echocardiogram on the same date revealed an ejection fraction of 20-25%.  Severity of symptoms is mild to moderate.  Nothing makes symptoms better or worse.      Past Medical History:  Diagnosis Date  . Asthma   . Chronic systolic CHF (congestive heart failure) (HCC)   . Cigarette smoker   . History of echocardiogram    a. Echo 5/17 - EF 20-25%, severe diff HK, restrictive physiology, mild to mod MR, severe reduced RVSF, mod RVE, mild RAE, mod TR, PASP 48 mmHg  . Hx of cardiac cath    a. LHC 5/17 - normal coronary arteries. PA 45/25, mean 33, PCWP mean 18  . NICM (nonischemic cardiomyopathy) (HCC)   . Substance abuse (HCC)    cocaine, marijuana    Patient Active Problem List   Diagnosis Date Noted  . Acute CVA (cerebrovascular accident) (HCC) 09/28/2017  . Stroke (HCC) 09/28/2017  . CHF (congestive heart failure) (HCC) 02/07/2016  . Acute on chronic systolic CHF (congestive heart failure) (HCC) 01/11/2016  . NICM (nonischemic cardiomyopathy) (HCC) 01/11/2016  . CKD (chronic kidney disease) stage 2, GFR 60-89 ml/min 01/11/2016  . Cocaine abuse (HCC) 01/11/2016  . Chest pain, pleuritic 01/03/2016  . Tobacco abuse 01/03/2016   . Asthma 01/03/2016  . Leg swelling 01/03/2016  . Tachycardia 01/03/2016  . Normocytic anemia 01/03/2016  . Elevated troponin 01/03/2016  . Right rib fracture 01/03/2016  . Chest pain     Past Surgical History:  Procedure Laterality Date  . CARDIAC CATHETERIZATION N/A 01/05/2016   Procedure: Right/Left Heart Cath and Coronary Angiography;  Surgeon: Lennette Bihari, MD;  Location: [facility] INVASIVE CV LAB;  Service: Cardiovascular;  Laterality: N/A;  . none         Home Medications    Prior to Admission medications   Medication Sig Start Date End Date Taking? Authorizing Provider  albuterol (PROVENTIL HFA;VENTOLIN HFA) 108 (90 Base) MCG/ACT inhaler Inhale 1-2 puffs into the lungs every 6 (six) hours as needed for wheezing or shortness of breath. 09/22/17   Rai, Delene Ruffini, MD  aspirin 81 MG EC tablet Take 1 tablet (81 mg total) by mouth daily. 09/22/17   Rai, Ripudeep Kirtland Bouchard, MD  beclomethasone (QVAR) 80 MCG/ACT inhaler Inhale 1 puff into the lungs 2 (two) times daily. 09/22/17   Rai, Ripudeep Kirtland Bouchard, MD  benzonatate (TESSALON) 100 MG capsule Take 1 capsule (100 mg total) by mouth 3 (three) times daily as needed for cough. [address] 09/22/17   Rai, Delene Ruffini, MD  carvedilol (COREG) 3.125 MG tablet Take 1 tablet (3.125 mg total) by mouth 2 (two) times daily with a meal. 09/22/17   Rai, Ripudeep K, MD  furosemide (LASIX) 40 MG tablet Take 1 tablet (40 mg  total) by mouth daily. 09/22/17   Rai, Ripudeep K, MD  guaiFENesin-dextromethorphan (ROBITUSSIN DM) 100-10 MG/5ML syrup Take 5 mLs by mouth every 4 (four) hours as needed for cough. 09/22/17   Rai, Ripudeep K, MD  isosorbide-hydrALAZINE (BIDIL) 20-37.5 MG tablet Take 1 tablet by mouth 3 (three) times daily. 09/22/17   Cathren Harsh, MD    Family History Family History  Problem Relation Age of Onset  . Cardiomyopathy Father        Reports his father has an LVAD  . Heart failure Father   . Hypertension Father   . Deep vein thrombosis Neg Hx     Social  History Social History   Tobacco Use  . Smoking status: Current Every Day Smoker    Packs/day: 0.50    Years: 20.00    Pack years: 10.00    Types: Cigarettes  . Smokeless tobacco: Never Used  Substance Use Topics  . Alcohol use: Yes    Alcohol/week: 3.0 oz    Types: 5 Shots of liquor per week    Comment: 5-6 shots of vodka daily  . Drug use: Yes    Types: Cocaine, Marijuana     Allergies   Hydrocodone; Lisinopril; Prednisone; and Penicillins   Review of Systems Review of Systems  All other systems reviewed and are negative.    Physical Exam Updated Vital Signs BP 109/80   Pulse (!) 102   Temp 98 F (36.7 C)   Resp (!) 25   SpO2 93%   Physical Exam  Constitutional: He is oriented to person, place, and time. He appears well-developed and well-nourished.  HENT:  Head: Normocephalic and atraumatic.  Eyes: Conjunctivae are normal.  Neck: Neck supple.  Cardiovascular: Normal rate and regular rhythm.  Pulmonary/Chest:  Slight expiratory wheeze.  Abdominal: Soft. Bowel sounds are normal.  Musculoskeletal: Normal range of motion.  Neurological: He is alert and oriented to person, place, and time.  Skin: Skin is warm and dry.  Psychiatric: He has a normal mood and affect. His behavior is normal.  Nursing note and vitals reviewed.    ED Treatments / Results  Labs (all labs ordered are listed, but only abnormal results are displayed) Labs Reviewed  COMPREHENSIVE METABOLIC PANEL - Abnormal; Notable for the following components:      Result Value   Creatinine, Ser 1.34 (*)    Calcium 8.8 (*)    Total Protein 6.2 (*)    Albumin 3.4 (*)    AST 58 (*)    ALT 71 (*)    All other components within normal limits  CBC WITH DIFFERENTIAL/PLATELET - Abnormal; Notable for the following components:   Hemoglobin 12.7 (*)    MCH 25.7 (*)    All other components within normal limits  D-DIMER, QUANTITATIVE (NOT AT [facility]) - Abnormal; Notable for the following components:    D-Dimer, Quant 0.86 (*)    All other components within normal limits  LIPASE, BLOOD  I-STAT TROPONIN, ED    EKG  EKG Interpretation  Date/Time:  Saturday September 09 2017 13:20:12 EST Ventricular Rate:  94 PR Interval:  152 QRS Duration: 78 QT Interval:  372 QTC Calculation: 465 R Axis:   94 Text Interpretation:  Sinus rhythm with sinus arrhythmia with occasional Premature ventricular complexes Lateral infarct , age undetermined Abnormal ECG Confirmed by Donnetta Hutching ([id number]) on 09/09/2017 5:38:10 PM       Radiology No results found.  Procedures Procedures (including critical care time)  Medications Ordered  in ED Medications  ipratropium-albuterol (DUONEB) 0.5-2.5 (3) MG/3ML nebulizer solution 3 mL (3 mLs Nebulization Given 09/09/17 2120)  predniSONE (DELTASONE) tablet 40 mg (40 mg Oral Given 09/09/17 2120)     Initial Impression / Assessment and Plan / ED Course  I have reviewed the triage vital signs and the nursing notes.  Pertinent labs & imaging results that were available during my care of the patient were reviewed by me and considered in my medical decision making (see chart for details).     Patient presents with dyspnea and epigastric pain.  No obvious epigastric tenderness on physical exam.  Chest x-ray shows no evidence of heart failure, but is most likely consistent with reactive airway disease or bronchitis.  EKG reveals normal sinus rhythm with PVCs.  Troponin negative.  D-dimer slightly elevated.  I would expect this with his multiple comorbidities.  He feels much better after a nebulizer treatment.  Will discharge home with prednisone.  Final Clinical Impressions(s) / ED Diagnoses   Final diagnoses:  Cough  Epigastric pain    ED Discharge Orders        Ordered    predniSONE (DELTASONE) 10 MG tablet  Daily with breakfast,   Status:  Discontinued     09/09/17 2245       Donnetta Hutching, MD 10/02/17 1412    Donnetta Hutching, MD 10/02/17 1413

## 2017-10-02 NOTE — Discharge Summary (Signed)
Discharge Summary  Mark [name]:[id number] DOB: 25-Jun-1976  PCP: Patient, No Pcp Per  Admit date: 09/28/2017 Discharge date: 10/02/2017  Time spent: 25 minutes  Recommendations for Outpatient Follow-up:  1. Patient left AMA on 10/01/17. Voiced understanding risks of leaving against medical advice and left.  Discharge Diagnoses:  Active Hospital Problems   Diagnosis Date Noted  . Acute CVA (cerebrovascular accident) (HCC) 09/28/2017  . CHF (congestive heart failure) (HCC) 02/07/2016  . CKD (chronic kidney disease) stage 2, GFR 60-89 ml/min 01/11/2016  . Cocaine abuse (HCC) 01/11/2016  . NICM (nonischemic cardiomyopathy) (HCC) 01/11/2016  . Elevated troponin 01/03/2016  . Tobacco abuse 01/03/2016    Resolved Hospital Problems  No resolved problems to display.    Vitals:   10/01/17 1338 10/01/17 1414  BP: (!) 145/83   Pulse: (!) 52   Resp: 19   Temp: 97.8 F (36.6 C)   SpO2: 98% 97%    History of present illness:  Mark [name] a 41 y.o.malewith medical history significantforasthma, CHF with an EF of 20%, chronic kidney disease, cocaine and opiate abuse presents to the emergency department chief complaint altered mental status/frequent falls/worsening shortness of breath. Initial evaluation reveals acute respiratory distressrequiringBiPAP and acute encephalopathy. He was evaluated by critical care medicine who recommended a medical admission. MRI revealed acute right MCA CVA. 2D echo 09/28/17 revealed LVEF 10-15%. 09/29/17: Ambulated with PT with recommendation for CIR. No new complaints.  10/01/17: Seen and examined at his bedside. Patient kis adamant about going home although PT recommends inpatient rehab.  Spoke to Dr. Eden Emms from cardiology who stated he will let his office know that the patient will need a follow up appointment post hospitalization. Denies chest pain, palpitations, and dyspnea. makes minimal eye contact and minimally interactive.  Patient  left AMA on 10/01/17. Explained the risks of leaving against medical advice. Patient voiced understanding the risks and left.  Hospital Course:  Principal Problem:   Acute CVA (cerebrovascular accident) [facility]) Active Problems:   Tobacco abuse   Elevated troponin   NICM (nonischemic cardiomyopathy) (HCC)   CKD (chronic kidney disease) stage 2, GFR 60-89 ml/min   Cocaine abuse (HCC)   CHF (congestive heart failure) (HCC)  Acute ischemic CVA Right MCA -most likely 2/2 to vasospasm from cocaine abuse -permissive HTN -10/01/17 continue ASA, lipitor -neurology following and signed off -10/01/17 continue PT/OT -fall precaution -CIR evaluation  Acute metabolic encephalopathy 2/2 to acute hypoxic respiratory failure requiring non invasive mechanical ventilation BIPAP, resolved  Acute hypoxic respiratory failure 2/2 to bronchiospasm vs others due to cocaine intoxication -resolved -saturating well on ambient air  Chronic heart failure with reduced EF 10-15% -2D echo done 09/28/17 -previous HFREF 20-25% -09/29/17. Cardiology Dr Mayford Knife called and stated no changes in cardiac medications recommended to follow up with Dr Clifton James in the outpatient setting.  -10/01/17 continue current management  Elevated troponin likely from demand ischemia. -Initial troponin 0.36.  -Patient is asymptomatic. -troponin trended down  Chronic kidney disease stage III.  -Creatinine 1.5 on admission.  -Appears his baselines around 1.45. -Avoid nephrotoxins -10/01/17 continue to Monitor urine output  Cocaine abuse.  -Urine drug screen positive for cocaine -polysubstance abuse counseling  Tobacco use -tobacco cessation counseling offered  Ambulatory dysfunction post acute CVA -continue PT -Consult to inpatient rehab  Chronic alcohol use -CIWA protocol  Ambulatory dysfuncton post acute CVA -continue PT/OT -PT recommends inpatient rehab  Speech impairment s/p acute CVA -speech therapist  following -will need to follow up with Language  Pathology services    Procedures:  none  Consultations:  Cardiology  PT  Inpatient rehab   Discharge Exam: BP (!) 145/83 (BP Location: Left Arm)   Pulse (!) 52   Temp 97.8 F (36.6 C) (Oral)   Resp 19   Ht 5\' 8"  (1.727 m)   Wt 75 kg (165 lb 5.5 oz)   SpO2 97%   BMI 25.14 kg/m   General: 43 yo AAM WD WN A&O x 3  Cardiovascular: RRR no rubs or gallops  Respiratory: CTA no wheezes or rales  Discharge Instructions You were cared for by a hospitalist during your hospital stay. If you have any questions about your discharge medications or the care you received while you were in the hospital after you are discharged, you can call the unit and asked to speak with the hospitalist on call if the hospitalist that took care of you is not available. Once you are discharged, your primary care physician will handle any further medical issues. Please note that NO REFILLS for any discharge medications will be authorized once you are discharged, as it is imperative that you return to your primary care physician (or establish a relationship with a primary care physician if you do not have one) for your aftercare needs so that they can reassess your need for medications and monitor your lab values.  Discharge Instructions    Ambulatory referral to Neurology   Complete by:  As directed    An appointment is requested in approximately: 6 weeks Follow up with stroke clinic (Dr Pearlean Brownie preferred, if not available, then consider Sylvie Farrier, [facility] or Lucia Gaskins whoever is available) at [facility] in about 6-8 weeks. Thanks.     Allergies as of 10/01/2017      Reactions   Hydrocodone Hives   Lisinopril Swelling, Other (See Comments)   Facial swelling/angioedema   Prednisone Shortness Of Breath, Nausea Only, Swelling, Other (See Comments)   Also made chest feel tight and genital area, legs, and face became swollen badly   Penicillins Hives, Swelling   Has  patient had a PCN reaction causing immediate rash, facial/tongue/throat swelling, SOB or lightheadedness with hypotension: Yes Has patient had a PCN reaction causing severe rash involving mucus membranes or skin necrosis: No Has patient had a PCN reaction that required hospitalization: No Has patient had a PCN reaction occurring within the last 10 years: No If all of the above answers are "NO", then may proceed with Cephalosporin use.      Medication List    ASK your doctor about these medications   albuterol 108 (90 Base) MCG/ACT inhaler Commonly known as:  PROVENTIL HFA;VENTOLIN HFA Inhale 1-2 puffs into the lungs every 6 (six) hours as needed for wheezing or shortness of breath.   aspirin 81 MG EC tablet Take 1 tablet (81 mg total) by mouth daily.   beclomethasone 80 MCG/ACT inhaler Commonly known as:  QVAR Inhale 1 puff into the lungs 2 (two) times daily.   benzonatate 100 MG capsule Commonly known as:  TESSALON Take 1 capsule (100 mg total) by mouth 3 (three) times daily as needed for cough. 340B Chris   carvedilol 3.125 MG tablet Commonly known as:  COREG Take 1 tablet (3.125 mg total) by mouth 2 (two) times daily with a meal.   furosemide 40 MG tablet Commonly known as:  LASIX Take 1 tablet (40 mg total) by mouth daily.   guaiFENesin-dextromethorphan 100-10 MG/5ML syrup Commonly known as:  ROBITUSSIN DM Take 5 mLs  by mouth every 4 (four) hours as needed for cough.   isosorbide-hydrALAZINE 20-37.5 MG tablet Commonly known as:  BIDIL Take 1 tablet by mouth 3 (three) times daily.      Allergies  Allergen Reactions  . Hydrocodone Hives  . Lisinopril Swelling and Other (See Comments)    Facial swelling/angioedema  . Prednisone Shortness Of Breath, Nausea Only, Swelling and Other (See Comments)    Also made chest feel tight and genital area, legs, and face became swollen badly  . Penicillins Hives and Swelling     Has patient had a PCN reaction causing immediate  rash, facial/tongue/throat swelling, SOB or lightheadedness with hypotension: Yes Has patient had a PCN reaction causing severe rash involving mucus membranes or skin necrosis: No Has patient had a PCN reaction that required hospitalization: No Has patient had a PCN reaction occurring within the last 10 years: No If all of the above answers are "NO", then may proceed with Cephalosporin use.    Follow-up Information    Micki Riley, MD. Schedule an appointment as soon as possible for a visit in 6 week(s).   Specialties:  Neurology, Radiology Contact information: [address] [place] [address] [phone]        [facility] Follow up.   Specialty:  Home Health Services Why:  please call this agency as soon as you have established a PCP with [facility]. Arrange HHPT/OT/RN.  Contact information: [address] [place] [address] [phone]        [facility]. Go on 10/02/2017.   Specialty:  Internal Medicine Why:  Please follow through with your appt and obtain a Primary Care MD to assist with Hospital follow up. You cannot have HH without a primary MD Your appt is at 1pm Contact information: [address] [place] [address] [phone]           The results of significant diagnostics from this hospitalization (including imaging, microbiology, ancillary and laboratory) are listed below for reference.    Significant Diagnostic Studies: Ct Angio Head W Or Wo Contrast  Result Date: 09/28/2017 CLINICAL DATA:  Initial evaluation for acute left-sided weakness, falls. EXAM: CT ANGIOGRAPHY HEAD AND NECK CT PERFUSION BRAIN TECHNIQUE: Multidetector CT imaging of the head and neck was performed using the standard protocol during bolus administration of intravenous contrast. Multiplanar CT image reconstructions and MIPs were obtained to evaluate the vascular anatomy. Carotid stenosis measurements (when  applicable) are obtained utilizing NASCET criteria, using the distal internal carotid diameter as the denominator. Multiphase CT imaging of the brain was performed following IV bolus contrast injection. Subsequent parametric perfusion maps were calculated using RAPID software. CONTRAST:  ISOVUE-370 IOPAMIDOL (ISOVUE-370) INJECTION 76% COMPARISON:  None. FINDINGS: CT HEAD FINDINGS Brain: Examination mildly limited by patient positioning. No acute intracranial hemorrhage. Abnormal hypodensity seen involving the right insular cortex, concerning for evolving right MCA territory infarct (series 3, image 19). Possible extension into the adjacent into cortex lateral to the insular ribbon. Basal ganglia and internal capsule are spared at this time. No other acute large vessel territory infarct. No mass lesion or midline shift. No hydrocephalus. No extra-axial fluid collection. Vascular: Abnormal hyperdensity within distal right M1 and/or M2 branch at the base of the sylvian fissure (series 3, image 17), concerning for thrombus. Skull: Scalp soft tissues and calvarium within normal limits. 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 Mild right sphenoid sinus opacification. Paranasal  sinuses are otherwise clear. Mastoids are clear. No acute abnormality about the globes or orbits. Defect noted at the posterior left globe. Other: None. ASPECTS (Alberta Stroke Program Early CT Score) - Ganglionic level infarction (caudate, lentiform nuclei, internal capsule, insula, M1-M3 cortex): 5 - Supraganglionic infarction (M4-M6 cortex): 3 Total score (0-10 with 10 being normal): 8 Review of the MIP images confirms the above findings CTA NECK FINDINGS Aortic arch: Visualized aortic arch of normal caliber with normal branch pattern. No flow-limiting stenosis about the origin of the great vessels. Visualized subclavian arteries patent. Right carotid system: Right common and internal  carotid arteries are widely patent without stenosis, dissection, or occlusion. No significant atheromatous narrowing about the right carotid bifurcation. Left carotid system: Left common and internal carotid arteries are widely patent without stenosis, dissection, or occlusion. No significant atheromatous narrowing about the left carotid bifurcation. Vertebral arteries: Left vertebral artery diffusely hypoplastic and arises from the aortic arch. Vertebral arteries widely patent within the neck without stenosis, dissection, or occlusion. Skeleton: No acute osseous abnormality. No worrisome lytic or blastic osseous lesions. Other neck: No acute soft tissue abnormality within the neck. No adenopathy. Salivary glands within normal limits. Thyroid within normal limits. Upper chest: Visualized upper chest demonstrates no acute abnormality. Emphysema. Visualized lungs are otherwise clear. Review of the MIP images confirms the above findings CTA HEAD FINDINGS Anterior circulation: Petrous segments widely patent. Cavernous and supraclinoid segments patent without high-grade stenosis. ICA termini patent. A1 segments patent. Anterior communicating artery patent and normal. Anterior cerebral arteries patent to their distal aspects without high-grade stenosis. Left M1 segment patent without stenosis. No proximal left M2 occlusion. Distal left MCA branches well perfused. Right M1 segment patent to its distal aspect. There is abrupt occlusion of the distal right M1 segment at the base of the sylvian fissure (series 13, image 99). This appears to be subocclusive with flow seen distally (series 13, image 94). No other proximal or large vessel occlusion. Asymmetric prominence and flow within the distal right MCA branches with left meningeal enhancement as compared to the left cerebral hemisphere, suspected to be related to collateral flow. Right MCA branches otherwise perfused. Posterior circulation: Right vertebral artery patent to  the vertebrobasilar junction without high-grade stenosis. Hypoplastic left vertebral artery largely terminates in PICA. Basilar artery diminutive but patent to its distal aspect. Superior cerebral arteries patent bilaterally. Fetal type origin of the PCAs bilaterally. PCAs irregular but patent to their distal aspects. There is diffuse irregularity throughout the intracranial circulation. While this finding may in part be atherosclerotic in nature, the diffuse nature suggest possible vasculopathy. Venous sinuses: Patent. Anatomic variants: Fetal type origin of the PCAs with diminutive vertebrobasilar system. No aneurysm. Delayed phase: Not performed. Review of the MIP images confirms the above findings CT Brain Perfusion Findings: CBF (<30%) Volume: 0mL Perfusion (Tmax>6.0s) volume: 89mL Mismatch Volume: 89mL Infarction Location:No core infarct by CT perfusion. Ischemic penumbra throughout the mid and posterior right MCA territory, primarily involving the right frontotemporal region. IMPRESSION: 1. Positive CTA for emergent large vessel occlusion involving distal right M1/proximal M2 segment. Flow seen distal to the site of occlusion, suggesting either subocclusive thrombus or possibly recanalization. Superimposed vasospasm may be contributory as well. Aspects equals 8. 2. Ischemic penumbra throughout the mid and posterior right MCA territory, primarily involving the right frontotemporal region. No core infarct identified by perfusion imaging. 3. Asymmetric leptomeningeal enhancement within the right MCA territory, likely related to collateralization. 4. Diffuse irregularity throughout the intracranial arterial circulation. While this  finding may in part be atherosclerotic in nature, an underlying vasculopathy could also be considered. 5. Emphysema. Critical Value/emergent results were called by telephone at the time of interpretation on 09/28/2017 at 3:37 am to Dr. Milon Dikes , who verbally acknowledged these  results. Electronically Signed   By: Rise Mu M.D.   On: 09/28/2017 04:13   Dg Chest 2 View  Result Date: 09/20/2017 CLINICAL DATA:  Facial and throat swelling, initial encounter EXAM: CHEST  2 VIEW COMPARISON:  09/09/2017 FINDINGS: Cardiac shadow is stable. The lungs are well aerated bilaterally. No focal infiltrate or sizable effusion is seen. No acute bony abnormality is noted. IMPRESSION: No active cardiopulmonary disease. Electronically Signed   By: Alcide Clever M.D.   On: 09/20/2017 19:12   Dg Chest 2 View  Result Date: 09/09/2017 CLINICAL DATA:  Shortness of breath with productive cough for months. History of asthma. EXAM: CHEST  2 VIEW COMPARISON:  Radiographs 02/07/2016 and 01/04/2016. FINDINGS: Stable mild cardiac enlargement. The mediastinal contours are stable. There is chronic central airway and fissural thickening, but no airspace disease, pleural effusion or pneumothorax. The bones appear unchanged. IMPRESSION: No apparent acute findings. Stable chronic central airway and fissural thickening consistent with reactive airways disease or bronchitis. Mild cardiomegaly. Electronically Signed   By: Carey Bullocks M.D.   On: 09/09/2017 14:07   Dg Abd 1 View  Result Date: 09/21/2017 CLINICAL DATA:  Intermittent bilateral abdominal pain for 1 day. EXAM: ABDOMEN - 1 VIEW COMPARISON:  None. FINDINGS: Gas and an ordinary amount of stool are present in nondilated colon. Gas is present in nondilated small bowel loops. There is no evidence of bowel obstruction. No intraperitoneal free air is identified on this supine study. Excreted IV contrast is noted in the bladder and renal collecting systems. No abnormal soft tissue calcification is seen. No acute osseous abnormality is identified. IMPRESSION: Negative. Electronically Signed   By: Sebastian Ache M.D.   On: 09/21/2017 14:46   Ct Head Wo Contrast  Result Date: 09/28/2017 CLINICAL DATA:  Initial evaluation for acute left-sided weakness,  falls. EXAM: CT ANGIOGRAPHY HEAD AND NECK CT PERFUSION BRAIN TECHNIQUE: Multidetector CT imaging of the head and neck was performed using the standard protocol during bolus administration of intravenous contrast. Multiplanar CT image reconstructions and MIPs were obtained to evaluate the vascular anatomy. Carotid stenosis measurements (when applicable) are obtained utilizing NASCET criteria, using the distal internal carotid diameter as the denominator. Multiphase CT imaging of the brain was performed following IV bolus contrast injection. Subsequent parametric perfusion maps were calculated using RAPID software. CONTRAST:  ISOVUE-370 IOPAMIDOL (ISOVUE-370) INJECTION 76% COMPARISON:  None. FINDINGS: CT HEAD FINDINGS Brain: Examination mildly limited by patient positioning. No acute intracranial hemorrhage. Abnormal hypodensity seen involving the right insular cortex, concerning for evolving right MCA territory infarct (series 3, image 19). Possible extension into the adjacent into cortex lateral to the insular ribbon. Basal ganglia and internal capsule are spared at this time. No other acute large vessel territory infarct. No mass lesion or midline shift. No hydrocephalus. No extra-axial fluid collection. Vascular: Abnormal hyperdensity within distal right M1 and/or M2 branch at the base of the sylvian fissure (series 3, image 17), concerning for thrombus. Skull: Scalp soft tissues and calvarium within normal limits. 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 Mild right sphenoid sinus opacification. Paranasal sinuses are otherwise clear. Mastoids are clear. No acute abnormality about the globes or orbits. Defect noted at the posterior left globe. Other:  None. ASPECTS (Alberta Stroke Program Early CT Score) - Ganglionic level infarction (caudate, lentiform nuclei, internal capsule, insula, M1-M3 cortex): 5 - Supraganglionic infarction (M4-M6 cortex): 3 Total  score (0-10 with 10 being normal): 8 Review of the MIP images confirms the above findings CTA NECK FINDINGS Aortic arch: Visualized aortic arch of normal caliber with normal branch pattern. No flow-limiting stenosis about the origin of the great vessels. Visualized subclavian arteries patent. Right carotid system: Right common and internal carotid arteries are widely patent without stenosis, dissection, or occlusion. No significant atheromatous narrowing about the right carotid bifurcation. Left carotid system: Left common and internal carotid arteries are widely patent without stenosis, dissection, or occlusion. No significant atheromatous narrowing about the left carotid bifurcation. Vertebral arteries: Left vertebral artery diffusely hypoplastic and arises from the aortic arch. Vertebral arteries widely patent within the neck without stenosis, dissection, or occlusion. Skeleton: No acute osseous abnormality. No worrisome lytic or blastic osseous lesions. Other neck: No acute soft tissue abnormality within the neck. No adenopathy. Salivary glands within normal limits. Thyroid within normal limits. Upper chest: Visualized upper chest demonstrates no acute abnormality. Emphysema. Visualized lungs are otherwise clear. Review of the MIP images confirms the above findings CTA HEAD FINDINGS Anterior circulation: Petrous segments widely patent. Cavernous and supraclinoid segments patent without high-grade stenosis. ICA termini patent. A1 segments patent. Anterior communicating artery patent and normal. Anterior cerebral arteries patent to their distal aspects without high-grade stenosis. Left M1 segment patent without stenosis. No proximal left M2 occlusion. Distal left MCA branches well perfused. Right M1 segment patent to its distal aspect. There is abrupt occlusion of the distal right M1 segment at the base of the sylvian fissure (series 13, image 99). This appears to be subocclusive with flow seen distally (series  13, image 94). No other proximal or large vessel occlusion. Asymmetric prominence and flow within the distal right MCA branches with left meningeal enhancement as compared to the left cerebral hemisphere, suspected to be related to collateral flow. Right MCA branches otherwise perfused. Posterior circulation: Right vertebral artery patent to the vertebrobasilar junction without high-grade stenosis. Hypoplastic left vertebral artery largely terminates in PICA. Basilar artery diminutive but patent to its distal aspect. Superior cerebral arteries patent bilaterally. Fetal type origin of the PCAs bilaterally. PCAs irregular but patent to their distal aspects. There is diffuse irregularity throughout the intracranial circulation. While this finding may in part be atherosclerotic in nature, the diffuse nature suggest possible vasculopathy. Venous sinuses: Patent. Anatomic variants: Fetal type origin of the PCAs with diminutive vertebrobasilar system. No aneurysm. Delayed phase: Not performed. Review of the MIP images confirms the above findings CT Brain Perfusion Findings: CBF (<30%) Volume: 0mL Perfusion (Tmax>6.0s) volume: 89mL Mismatch Volume: 89mL Infarction Location:No core infarct by CT perfusion. Ischemic penumbra throughout the mid and posterior right MCA territory, primarily involving the right frontotemporal region. IMPRESSION: 1. Positive CTA for emergent large vessel occlusion involving distal right M1/proximal M2 segment. Flow seen distal to the site of occlusion, suggesting either subocclusive thrombus or possibly recanalization. Superimposed vasospasm may be contributory as well. Aspects equals 8. 2. Ischemic penumbra throughout the mid and posterior right MCA territory, primarily involving the right frontotemporal region. No core infarct identified by perfusion imaging. 3. Asymmetric leptomeningeal enhancement within the right MCA territory, likely related to collateralization. 4. Diffuse irregularity  throughout the intracranial arterial circulation. While this finding may in part be atherosclerotic in nature, an underlying vasculopathy could also be considered. 5. Emphysema. Critical Value/emergent results were called  by telephone at the time of interpretation on 09/28/2017 at 3:37 am to Dr. Milon Dikes , who verbally acknowledged these results. Electronically Signed   By: Rise Mu M.D.   On: 09/28/2017 04:13   Ct Angio Neck W And/or Wo Contrast  Result Date: 09/28/2017 CLINICAL DATA:  Initial evaluation for acute left-sided weakness, falls. EXAM: CT ANGIOGRAPHY HEAD AND NECK CT PERFUSION BRAIN TECHNIQUE: Multidetector CT imaging of the head and neck was performed using the standard protocol during bolus administration of intravenous contrast. Multiplanar CT image reconstructions and MIPs were obtained to evaluate the vascular anatomy. Carotid stenosis measurements (when applicable) are obtained utilizing NASCET criteria, using the distal internal carotid diameter as the denominator. Multiphase CT imaging of the brain was performed following IV bolus contrast injection. Subsequent parametric perfusion maps were calculated using RAPID software. CONTRAST:  ISOVUE-370 IOPAMIDOL (ISOVUE-370) INJECTION 76% COMPARISON:  None. FINDINGS: CT HEAD FINDINGS Brain: Examination mildly limited by patient positioning. No acute intracranial hemorrhage. Abnormal hypodensity seen involving the right insular cortex, concerning for evolving right MCA territory infarct (series 3, image 19). Possible extension into the adjacent into cortex lateral to the insular ribbon. Basal ganglia and internal capsule are spared at this time. No other acute large vessel territory infarct. No mass lesion or midline shift. No hydrocephalus. No extra-axial fluid collection. Vascular: Abnormal hyperdensity within distal right M1 and/or M2 branch at the base of the sylvian fissure (series 3, image 17), concerning for thrombus.  Skull: Scalp soft tissues and calvarium within normal limits. 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 Mild right sphenoid sinus opacification. Paranasal sinuses are otherwise clear. Mastoids are clear. No acute abnormality about the globes or orbits. Defect noted at the posterior left globe. Other: None. ASPECTS (Alberta Stroke Program Early CT Score) - Ganglionic level infarction (caudate, lentiform nuclei, internal capsule, insula, M1-M3 cortex): 5 - Supraganglionic infarction (M4-M6 cortex): 3 Total score (0-10 with 10 being normal): 8 Review of the MIP images confirms the above findings CTA NECK FINDINGS Aortic arch: Visualized aortic arch of normal caliber with normal branch pattern. No flow-limiting stenosis about the origin of the great vessels. Visualized subclavian arteries patent. Right carotid system: Right common and internal carotid arteries are widely patent without stenosis, dissection, or occlusion. No significant atheromatous narrowing about the right carotid bifurcation. Left carotid system: Left common and internal carotid arteries are widely patent without stenosis, dissection, or occlusion. No significant atheromatous narrowing about the left carotid bifurcation. Vertebral arteries: Left vertebral artery diffusely hypoplastic and arises from the aortic arch. Vertebral arteries widely patent within the neck without stenosis, dissection, or occlusion. Skeleton: No acute osseous abnormality. No worrisome lytic or blastic osseous lesions. Other neck: No acute soft tissue abnormality within the neck. No adenopathy. Salivary glands within normal limits. Thyroid within normal limits. Upper chest: Visualized upper chest demonstrates no acute abnormality. Emphysema. Visualized lungs are otherwise clear. Review of the MIP images confirms the above findings CTA HEAD FINDINGS Anterior circulation: Petrous segments widely patent. Cavernous and  supraclinoid segments patent without high-grade stenosis. ICA termini patent. A1 segments patent. Anterior communicating artery patent and normal. Anterior cerebral arteries patent to their distal aspects without high-grade stenosis. Left M1 segment patent without stenosis. No proximal left M2 occlusion. Distal left MCA branches well perfused. Right M1 segment patent to its distal aspect. There is abrupt occlusion of the distal right M1 segment at the base of the sylvian fissure (series 13, image 99). This appears to be  subocclusive with flow seen distally (series 13, image 94). No other proximal or large vessel occlusion. Asymmetric prominence and flow within the distal right MCA branches with left meningeal enhancement as compared to the left cerebral hemisphere, suspected to be related to collateral flow. Right MCA branches otherwise perfused. Posterior circulation: Right vertebral artery patent to the vertebrobasilar junction without high-grade stenosis. Hypoplastic left vertebral artery largely terminates in PICA. Basilar artery diminutive but patent to its distal aspect. Superior cerebral arteries patent bilaterally. Fetal type origin of the PCAs bilaterally. PCAs irregular but patent to their distal aspects. There is diffuse irregularity throughout the intracranial circulation. While this finding may in part be atherosclerotic in nature, the diffuse nature suggest possible vasculopathy. Venous sinuses: Patent. Anatomic variants: Fetal type origin of the PCAs with diminutive vertebrobasilar system. No aneurysm. Delayed phase: Not performed. Review of the MIP images confirms the above findings CT Brain Perfusion Findings: CBF (<30%) Volume: 0mL Perfusion (Tmax>6.0s) volume: 89mL Mismatch Volume: 89mL Infarction Location:No core infarct by CT perfusion. Ischemic penumbra throughout the mid and posterior right MCA territory, primarily involving the right frontotemporal region. IMPRESSION: 1. Positive CTA for  emergent large vessel occlusion involving distal right M1/proximal M2 segment. Flow seen distal to the site of occlusion, suggesting either subocclusive thrombus or possibly recanalization. Superimposed vasospasm may be contributory as well. Aspects equals 8. 2. Ischemic penumbra throughout the mid and posterior right MCA territory, primarily involving the right frontotemporal region. No core infarct identified by perfusion imaging. 3. Asymmetric leptomeningeal enhancement within the right MCA territory, likely related to collateralization. 4. Diffuse irregularity throughout the intracranial arterial circulation. While this finding may in part be atherosclerotic in nature, an underlying vasculopathy could also be considered. 5. Emphysema. Critical Value/emergent results were called by telephone at the time of interpretation on 09/28/2017 at 3:37 am to Dr. Milon Dikes , who verbally acknowledged these results. Electronically Signed   By: Rise Mu M.D.   On: 09/28/2017 04:13   Ct Angio Chest Pe W Or Wo Contrast  Result Date: 09/21/2017 CLINICAL DATA:  Shortness of breath.  Positive D-dimer. EXAM: CT ANGIOGRAPHY CHEST WITH CONTRAST TECHNIQUE: Multidetector CT imaging of the chest was performed using the standard protocol during bolus administration of intravenous contrast. Multiplanar CT image reconstructions and MIPs were obtained to evaluate the vascular anatomy. CONTRAST:  100 mL Isovue 370 COMPARISON:  01/03/2016 FINDINGS: Cardiovascular: Good opacification of the central and segmental pulmonary arteries. No focal filling defects demonstrated. No evidence of significant pulmonary embolus. Cardiac enlargement with left ventricular dilatation. No pericardial effusion. Normal caliber thoracic aorta. Mediastinum/Nodes: No enlarged mediastinal, hilar, or axillary lymph nodes. Thyroid gland, trachea, and esophagus demonstrate no significant findings. Lungs/Pleura: Emphysematous changes in the lungs. No  airspace disease or consolidation. No pleural effusions. No pneumothorax. Airways are patent. Upper Abdomen: No acute abnormality. Musculoskeletal: No chest wall abnormality. No acute or significant osseous findings. Review of the MIP images confirms the above findings. IMPRESSION: 1. No evidence of significant pulmonary embolus. 2. Cardiac enlargement with left ventricular dilatation. 3. No evidence of active pulmonary disease. 4. Diffuse emphysematous changes in the lungs. Emphysema (ICD10-J43.9). Electronically Signed   By: Burman Nieves M.D.   On: 09/21/2017 01:11   Mr Brain Wo Contrast  Result Date: 09/28/2017 CLINICAL DATA:  Asthma. Congestive heart failure. Polysubstance abuse including cocaine. EXAM: MRI HEAD WITHOUT CONTRAST TECHNIQUE: Multiplanar, multiecho pulse sequences of the brain and surrounding structures were obtained without intravenous contrast. COMPARISON:  Head CT and CTA from earlier  today FINDINGS: Brain: Moderate acute infarct involving the right insula, frontal operculum, and right corona radiata/centrum semiovale. No acute hemorrhage or hydrocephalus. No extra-axial collection or masslike finding. No pre-existing ischemic injury is noted. Vascular: Diminished flow voids and increased FLAIR signal within right M2 branches. Skull and upper cervical spine: No evidence of marrow lesion Sinuses/Orbits: Right maxillary sinusitis with mucosal thickening and secretions. The right maxillary sinus is completely opacified. 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 There is associated slow/absent flow within the associated right M2 branch. Electronically Signed   By: Marnee Spring M.D.   On: 09/28/2017 12:09   Ct Cerebral Perfusion W Contrast  Result Date: 09/28/2017 CLINICAL DATA:  Initial evaluation for acute left-sided weakness, falls. EXAM: CT ANGIOGRAPHY HEAD AND NECK CT PERFUSION BRAIN TECHNIQUE: Multidetector CT imaging of the head and neck was  performed using the standard protocol during bolus administration of intravenous contrast. Multiplanar CT image reconstructions and MIPs were obtained to evaluate the vascular anatomy. Carotid stenosis measurements (when applicable) are obtained utilizing NASCET criteria, using the distal internal carotid diameter as the denominator. Multiphase CT imaging of the brain was performed following IV bolus contrast injection. Subsequent parametric perfusion maps were calculated using RAPID software. CONTRAST:  ISOVUE-370 IOPAMIDOL (ISOVUE-370) INJECTION 76% COMPARISON:  None. FINDINGS: CT HEAD FINDINGS Brain: Examination mildly limited by patient positioning. No acute intracranial hemorrhage. Abnormal hypodensity seen involving the right insular cortex, concerning for evolving right MCA territory infarct (series 3, image 19). Possible extension into the adjacent into cortex lateral to the insular ribbon. Basal ganglia and internal capsule are spared at this time. No other acute large vessel territory infarct. No mass lesion or midline shift. No hydrocephalus. No extra-axial fluid collection. Vascular: Abnormal hyperdensity within distal right M1 and/or M2 branch at the base of the sylvian fissure (series 3, image 17), concerning for thrombus. Skull: Scalp soft tissues and calvarium within normal limits. 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 Mild right sphenoid sinus opacification. Paranasal sinuses are otherwise clear. Mastoids are clear. No acute abnormality about the globes or orbits. Defect noted at the posterior left globe. Other: None. ASPECTS (Alberta Stroke Program Early CT Score) - Ganglionic level infarction (caudate, lentiform nuclei, internal capsule, insula, M1-M3 cortex): 5 - Supraganglionic infarction (M4-M6 cortex): 3 Total score (0-10 with 10 being normal): 8 Review of the MIP images confirms the above findings CTA NECK FINDINGS Aortic arch:  Visualized aortic arch of normal caliber with normal branch pattern. No flow-limiting stenosis about the origin of the great vessels. Visualized subclavian arteries patent. Right carotid system: Right common and internal carotid arteries are widely patent without stenosis, dissection, or occlusion. No significant atheromatous narrowing about the right carotid bifurcation. Left carotid system: Left common and internal carotid arteries are widely patent without stenosis, dissection, or occlusion. No significant atheromatous narrowing about the left carotid bifurcation. Vertebral arteries: Left vertebral artery diffusely hypoplastic and arises from the aortic arch. Vertebral arteries widely patent within the neck without stenosis, dissection, or occlusion. Skeleton: No acute osseous abnormality. No worrisome lytic or blastic osseous lesions. Other neck: No acute soft tissue abnormality within the neck. No adenopathy. Salivary glands within normal limits. Thyroid within normal limits. Upper chest: Visualized upper chest demonstrates no acute abnormality. Emphysema. Visualized lungs are otherwise clear. Review of the MIP images confirms the above findings CTA HEAD FINDINGS Anterior circulation: Petrous segments widely patent. Cavernous and supraclinoid segments patent without high-grade stenosis. ICA termini patent. A1  segments patent. Anterior communicating artery patent and normal. Anterior cerebral arteries patent to their distal aspects without high-grade stenosis. Left M1 segment patent without stenosis. No proximal left M2 occlusion. Distal left MCA branches well perfused. Right M1 segment patent to its distal aspect. There is abrupt occlusion of the distal right M1 segment at the base of the sylvian fissure (series 13, image 99). This appears to be subocclusive with flow seen distally (series 13, image 94). No other proximal or large vessel occlusion. Asymmetric prominence and flow within the distal right MCA  branches with left meningeal enhancement as compared to the left cerebral hemisphere, suspected to be related to collateral flow. Right MCA branches otherwise perfused. Posterior circulation: Right vertebral artery patent to the vertebrobasilar junction without high-grade stenosis. Hypoplastic left vertebral artery largely terminates in PICA. Basilar artery diminutive but patent to its distal aspect. Superior cerebral arteries patent bilaterally. Fetal type origin of the PCAs bilaterally. PCAs irregular but patent to their distal aspects. There is diffuse irregularity throughout the intracranial circulation. While this finding may in part be atherosclerotic in nature, the diffuse nature suggest possible vasculopathy. Venous sinuses: Patent. Anatomic variants: Fetal type origin of the PCAs with diminutive vertebrobasilar system. No aneurysm. Delayed phase: Not performed. Review of the MIP images confirms the above findings CT Brain Perfusion Findings: CBF (<30%) Volume: 0mL Perfusion (Tmax>6.0s) volume: 89mL Mismatch Volume: 89mL Infarction Location:No core infarct by CT perfusion. Ischemic penumbra throughout the mid and posterior right MCA territory, primarily involving the right frontotemporal region. IMPRESSION: 1. Positive CTA for emergent large vessel occlusion involving distal right M1/proximal M2 segment. Flow seen distal to the site of occlusion, suggesting either subocclusive thrombus or possibly recanalization. Superimposed vasospasm may be contributory as well. Aspects equals 8. 2. Ischemic penumbra throughout the mid and posterior right MCA territory, primarily involving the right frontotemporal region. No core infarct identified by perfusion imaging. 3. Asymmetric leptomeningeal enhancement within the right MCA territory, likely related to collateralization. 4. Diffuse irregularity throughout the intracranial arterial circulation. While this finding may in part be atherosclerotic in nature, an  underlying vasculopathy could also be considered. 5. Emphysema. Critical Value/emergent results were called by telephone at the time of interpretation on 09/28/2017 at 3:37 am to Dr. Milon Dikes , who verbally acknowledged these results. Electronically Signed   By: Rise Mu M.D.   On: 09/28/2017 04:13   Dg Chest Port 1 View  Result Date: 09/28/2017 CLINICAL DATA:  Acute onset of shortness of breath. EXAM: PORTABLE CHEST 1 VIEW COMPARISON:  Chest radiograph performed 09/20/2017, and CTA of the chest performed 09/21/2017 FINDINGS: The lungs are well-aerated and clear. There is no evidence of focal opacification, pleural effusion or pneumothorax. The cardiomediastinal silhouette is mildly enlarged. No acute osseous abnormalities are seen. IMPRESSION: Mild cardiomegaly.  Lungs remain grossly clear. Electronically Signed   By: Roanna Raider M.D.   On: 09/28/2017 02:24   Dg Swallowing Func-speech Pathology  Result Date: 09/28/2017 Objective Swallowing Evaluation: Type of Study: MBS-Modified Barium Swallow Study  Patient Details Name: CALLAHAN WILD MRN: [id number] Date of Birth: Jul 24, 1976 Today's Date: 09/28/2017 Time: SLP Start Time (ACUTE ONLY): 1425 -SLP Stop Time (ACUTE ONLY): 1447 SLP Time Calculation (min) (ACUTE ONLY): 22 min Past Medical History: Past Medical History: Diagnosis Date . Asthma  . Chronic systolic CHF (congestive heart failure) (HCC)  . Cigarette smoker  . History of echocardiogram   a. Echo 5/17 - EF 20-25%, severe diff HK, restrictive physiology, mild to mod MR, severe  reduced RVSF, mod RVE, mild RAE, mod TR, PASP 48 mmHg . Hx of cardiac cath   a. LHC 5/17 - normal coronary arteries. PA 45/25, mean 33, PCWP mean 18 . NICM (nonischemic cardiomyopathy) (HCC)  . Substance abuse (HCC)   cocaine, marijuana Past Surgical History: Past Surgical History: Procedure Laterality Date . CARDIAC CATHETERIZATION N/A 01/05/2016  Procedure: Right/Left Heart Cath and Coronary Angiography;  Surgeon:  Lennette Bihari, MD;  Location: [facility] INVASIVE CV LAB;  Service: Cardiovascular;  Laterality: N/A; . none   HPI: Dillinger C [name] a 42 y.o.malewho has apastmedical history of cocaine and marijuana abuse, chronic systolic CHF (non ischemic cardiomyopathy), tobacco abuse, asthma, who presented to the emergency room for evaluation of multiple falls and shortness of breath. Unknown last know normal. Pt reports inconsistent left sided weakness for over a year. MRI (2/7) revealed a moderate to large right MCA distribution infarct as described. There is associated slow/absent flow within the associated right M2 branch. CXR (2/7) Lungs remain grossly clear. Pt's swallow stroke screen failed secondary to inability to cough on demand.  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 Aspiration occured over the arytenoids likely related to impaired timing of airway closure and delayed swallow trigger at the pyriform sinuses. However, pt was able to clear only penetrates from laryngeal vestibule with cued and spontaneous throat clear/cough. Using chin tuck strategy with nectar thick liquids, the pt only had penetration, which cleared independently with subsequent swallow.  Chin tuck strategy was also used for solid trials to reduce moderate residue. Pt required Mod verbal cues to maintain chin tuck during trials and reduce impulsivity. Oral deficits (weak lingual manipulation, reduced posterior propulsion, lingual residue) resulted in decreased bolus cohesion and oral residue. Given observed aspiration/penetration and oral phase deficits recommend Nectar Thick liquids and Dys 2 (chopped) diet with mandatory chin tuck with  each swallow and general aspiration precautions. Pt's degree of dysphagia likley to improve with pharyngeal exercises, increased oral control, and improved mentation. SLP will follow to ensure minimal overt signs of difficulty with prescribed diet and readiness to advance diet/repeat tesing.   SLP Visit Diagnosis Dysphagia, oropharyngeal phase (R13.12) Attention and concentration deficit following -- Frontal lobe and executive function deficit following -- Impact on safety and function Moderate aspiration risk   CHL IP TREATMENT RECOMMENDATION 09/28/2017 Treatment Recommendations Therapy as outlined in treatment plan below   Prognosis 09/28/2017 Prognosis for Safe Diet Advancement Good Barriers to Reach Goals Motivation;Cognitive deficits;Behavior Barriers/Prognosis Comment -- CHL IP DIET RECOMMENDATION 09/28/2017 SLP Diet Recommendations Dysphagia 2 (Fine chop) solids;Nectar thick liquid Liquid Administration via Cup;Straw Medication Administration Crushed with puree Compensations Minimize environmental distractions;Slow rate;Small sips/bites;Monitor for anterior loss;Chin tuck Postural Changes Seated upright at 90 degrees   CHL IP OTHER RECOMMENDATIONS 09/28/2017 Recommended Consults -- Oral Care Recommendations Oral care BID Other Recommendations Order thickener from pharmacy;Prohibited food (jello, ice cream, thin soups);Remove water pitcher   CHL IP FOLLOW UP RECOMMENDATIONS 09/28/2017 Follow up Recommendations Other (comment)   CHL IP FREQUENCY AND DURATION 09/28/2017 Speech Therapy Frequency (ACUTE ONLY) min 2x/week Treatment Duration 2 weeks      CHL IP ORAL PHASE 09/28/2017 Oral Phase Impaired Oral - Pudding Teaspoon -- Oral - Pudding Cup -- Oral - Honey Teaspoon -- Oral -  Honey Cup -- Oral - Nectar Teaspoon -- Oral - Nectar Cup Left anterior bolus loss;Decreased bolus cohesion Oral - Nectar Straw Left anterior bolus loss;Decreased bolus cohesion Oral - Thin Teaspoon -- Oral - Thin Cup Left anterior bolus loss;Decreased  bolus cohesion Oral - Thin Straw Left anterior bolus loss;Decreased bolus cohesion Oral - Puree Left anterior bolus loss;Weak lingual manipulation;Reduced posterior propulsion;Lingual/palatal residue Oral - Mech Soft -- Oral - Regular Left anterior bolus loss;Weak lingual manipulation;Reduced posterior propulsion;Lingual/palatal residue Oral - Multi-Consistency -- Oral - Pill -- Oral Phase - Comment --  CHL IP PHARYNGEAL PHASE 09/28/2017 Pharyngeal Phase Impaired Pharyngeal- Pudding Teaspoon -- Pharyngeal -- Pharyngeal- Pudding Cup -- Pharyngeal -- Pharyngeal- Honey Teaspoon -- Pharyngeal -- Pharyngeal- Honey Cup -- Pharyngeal -- Pharyngeal- Nectar Teaspoon -- Pharyngeal -- Pharyngeal- Nectar Cup Compensatory strategies attempted (with notebox);Penetration/Aspiration during swallow;Delayed swallow initiation-pyriform sinuses;Pharyngeal residue - valleculae;Reduced tongue base retraction Pharyngeal Material enters airway, remains ABOVE vocal cords and not ejected out Pharyngeal- Nectar Straw Compensatory strategies attempted (with notebox);Pharyngeal residue - valleculae;Reduced tongue base retraction;Delayed swallow initiation-pyriform sinuses Pharyngeal Material does not enter airway Pharyngeal- Thin Teaspoon -- Pharyngeal -- Pharyngeal- Thin Cup Compensatory strategies attempted (with notebox);Pharyngeal residue - valleculae;Delayed swallow initiation-pyriform sinuses;Reduced tongue base retraction;Penetration/Aspiration during swallow Pharyngeal Material enters airway, passes BELOW cords and not ejected out despite cough attempt by patient Pharyngeal- Thin Straw Penetration/Aspiration during swallow;Pharyngeal residue - valleculae;Delayed swallow initiation-pyriform sinuses;Reduced tongue base retraction;Compensatory strategies attempted (with notebox) Pharyngeal Material enters airway, passes BELOW cords and not ejected out despite cough attempt by patient Pharyngeal- Puree Pharyngeal residue - valleculae;Reduced  tongue base retraction;Compensatory strategies attempted (with notebox);Pharyngeal residue - posterior pharnyx;Reduced pharyngeal peristalsis Pharyngeal -- Pharyngeal- Mechanical Soft -- Pharyngeal -- Pharyngeal- Regular Reduced tongue base retraction;Reduced pharyngeal peristalsis;Pharyngeal residue - valleculae;Pharyngeal residue - posterior pharnyx;Compensatory strategies attempted (with notebox) Pharyngeal -- Pharyngeal- Multi-consistency -- Pharyngeal -- Pharyngeal- Pill -- Pharyngeal -- Pharyngeal Comment --  CHL IP CERVICAL ESOPHAGEAL PHASE 09/28/2017 Cervical Esophageal Phase WFL Pudding Teaspoon -- Pudding Cup -- Honey Teaspoon -- Honey Cup -- Nectar Teaspoon -- Nectar Cup -- Nectar Straw -- Thin Teaspoon -- Thin Cup -- Thin Straw -- Puree -- Mechanical Soft -- Regular -- Multi-consistency -- Pill -- Cervical Esophageal Comment -- No flowsheet data found. Maxcine Ham 09/28/2017, 4:47 PM  Note populated for [place] Jarrett, Student SLP Maxcine Ham, M.A. CCC-SLP [phone]              Microbiology: Recent Results (from the past 240 hour(s))  MRSA PCR Screening     Status: Abnormal   Collection Time: 09/28/17  8:11 PM  Result Value Ref Range Status   MRSA by PCR POSITIVE (A) NEGATIVE Final    Comment:        The GeneXpert MRSA Assay (FDA approved for NASAL specimens only), is one component of a comprehensive MRSA colonization surveillance program. It is not intended to diagnose MRSA infection nor to guide or monitor treatment for MRSA infections. RESULT CALLED TO, READ BACK BY AND VERIFIED WITH: R [facility] RN 09/29/17 0013 JDW`      Labs: Basic Metabolic Panel: Recent Labs  Lab 09/28/17 0158 09/28/17 0235 09/30/17 1241  NA 136 137 135  K 4.4 4.4 4.5  CL 101 99* 102  CO2 26  --  25  GLUCOSE 122* 118* 148*  BUN 21* 24* 28*  CREATININE 1.57* 1.50* 1.49*  CALCIUM 8.7*  --  8.6*   Liver Function Tests: Recent Labs  Lab 09/28/17 0158  AST 105*  ALT 250*  ALKPHOS 96   BILITOT 0.6  PROT 6.1*  ALBUMIN 3.1*   No results for input(s): LIPASE, AMYLASE in the last 168 hours. No results for input(s): AMMONIA in the last 168 hours. CBC: Recent Labs  Lab 09/28/17 0158 09/28/17 0235  WBC 9.0  --   NEUTROABS 5.5  --   HGB 12.2* 14.3  HCT 36.7* 42.0  MCV 78.6  --   PLT 176  --    Cardiac Enzymes: Recent Labs  Lab 09/28/17 1101 09/28/17 2011  TROPONINI 0.34* 0.24*   BNP: BNP (last 3 results) Recent Labs    09/20/17 2107 09/28/17 0444  BNP 2,303.5* 1,504.5*    ProBNP (last 3 results) No results for input(s): PROBNP in the last 8760 hours.  CBG: No results for input(s): GLUCAP in the last 168 hours.     Signed:  Darlin Drop, MD [facility] 10/02/2017, 6:30 PM

## 2017-10-09 ENCOUNTER — Telehealth: Payer: Self-pay | Admitting: *Deleted

## 2017-10-09 ENCOUNTER — Ambulatory Visit (INDEPENDENT_AMBULATORY_CARE_PROVIDER_SITE_OTHER): Payer: Self-pay | Admitting: Physician Assistant

## 2017-10-09 ENCOUNTER — Encounter: Payer: Self-pay | Admitting: Physician Assistant

## 2017-10-09 VITALS — BP 120/80 | HR 104 | Ht 68.0 in | Wt 168.8 lb

## 2017-10-09 DIAGNOSIS — Z720 Tobacco use: Secondary | ICD-10-CM

## 2017-10-09 DIAGNOSIS — Z8673 Personal history of transient ischemic attack (TIA), and cerebral infarction without residual deficits: Secondary | ICD-10-CM

## 2017-10-09 DIAGNOSIS — I5022 Chronic systolic (congestive) heart failure: Secondary | ICD-10-CM

## 2017-10-09 DIAGNOSIS — N182 Chronic kidney disease, stage 2 (mild): Secondary | ICD-10-CM

## 2017-10-09 DIAGNOSIS — I428 Other cardiomyopathies: Secondary | ICD-10-CM

## 2017-10-09 DIAGNOSIS — F1410 Cocaine abuse, uncomplicated: Secondary | ICD-10-CM

## 2017-10-09 DIAGNOSIS — J45909 Unspecified asthma, uncomplicated: Secondary | ICD-10-CM

## 2017-10-09 MED ORDER — CARVEDILOL 3.125 MG PO TABS: 3.1250 mg | ORAL_TABLET | Freq: Two times a day (BID) | ORAL | 11 refills | Status: DC

## 2017-10-09 MED ORDER — FUROSEMIDE 40 MG PO TABS: 40.0000 mg | ORAL_TABLET | Freq: Every day | ORAL | 11 refills | Status: DC

## 2017-10-09 NOTE — Progress Notes (Signed)
Cardiology Office Note:    Date:  10/09/2017   ID:  BARDIA WANGERIN, DOB 1976/04/29, MRN [id number]  PCP:  Patient, No Pcp Per  Cardiologist:  Verne Carrow, MD   Referring MD: No ref. provider found   Chief Complaint  Patient presents with  . Hospitalization Follow-up    CHF, stroke    History of Present Illness:    Mark [name] is a 42 y.o. male with a hx of asthma, tobacco abuse, polysubstance abuse (alcohol, cocaine, marijuana), systolic heart failure in the setting of nonischemic cardiomyopathy.  He was evaluated by Dr. Verne Carrow in the hospital in 12/2015 for acute systolic heart failure and minimally elevated troponin levels.  EF was 20-25 by echocardiogram.  Cardiac catheterization demonstrated normal coronary arteries.  He was incarcerated after discharge until August 2018.  He was released without prescriptions.    He was recently admitted 1/30-2/1 with decompensated heart failure.  The patient has a history of allergy with reported anaphylaxis to ACE inhibitor.  He was placed on hydralazine and nitrates.  Nonselective beta-blocker was used due to history of cocaine use.  Follow-up echo demonstrated EF 20%.  He was readmitted 2/7-2/11 with an acute right MCA infarct.  He initially presented with respiratory distress requiring BiPAP and acute encephalopathy.  Follow-up echo demonstrated EF 10-15.  UDS was positive for cocaine.  He had a minimally elevated troponin felt to be related to demand ischemia.  Carvedilol and hydralazine/nitrates were held to allow permissive hypertension.  Neurology felt that his stroke was related to cardiomyopathy.  He was deemed to be a poor candidate for anticoagulation and was treated with aspirin only.  Inpatient rehabilitation was recommended but the patient left the hospital AGAINST MEDICAL ADVICE.  Mark [name] returns for post hospital follow up.  He is here with his girlfriend.  He has not been taking any medicines other than ASA 81  and Albuterol inhaler since DC.  He notes that a medication he was to take 3 times a day caused him to swell.  This was prior to his most recent admission.  I suspect he is describing BiDil.  He does not weigh himself.  His weight at DC was 170 and is 169 lbs today.  He notes orthopnea and paroxysmal nocturnal dyspnea.  He denies leg swelling. He has chest pain described as tightness that is intermittent.  It is not related to exertion.  He does not shortness of breath with minimal activity.  He denies syncope.  He has noted a cough and has seen some blood in his sputum at times.  This is typically in the morning.    Prior CV studies:   The following studies were reviewed today:  Chest CTA 09/21/17 IMPRESSION: 1. No evidence of significant pulmonary embolus. 2. Cardiac enlargement with left ventricular dilatation. 3. No evidence of active pulmonary disease. 4. Diffuse emphysematous changes in the lungs.  Echo 09/28/17 EF 10-15, severe diffuse HK, moderate to severe MR, mild RVE, mildly reduced RVSF, mild to moderate RAE, PASP 56  Echo 09/21/17 Mild LVH, EF 20, diffuse HK, mild MR, mild LAE, LVE, mild RAE, PASP 41  Cardiac catheterization 01/05/16 Dilated congestive nonischemic cardiomyopathy with echo Doppler assessment of EF at 20 - 25%. Elevation of right heart pressures with moderate pulmonary hypertension. Mean PA 33, PCWP mean 22, CO 3.13, CI 1.53 Normal coronary arteries.  Echo 01/03/16 EF 20-25, severe diffuse HK, restrictive physiology, mild to moderate MR, moderate RV E, severely reduced RVSF,  mild RAE, moderate TR, PASP 48  Past Medical History:  Diagnosis Date  . Asthma   . Chronic systolic CHF (congestive heart failure) (HCC)   . Cigarette smoker   . History of echocardiogram    a. Echo 5/17 - EF 20-25%, severe diff HK, restrictive physiology, mild to mod MR, severe reduced RVSF, mod RVE, mild RAE, mod TR, PASP 48 mmHg  . Hx of cardiac cath    a. LHC 5/17 - normal coronary  arteries. PA 45/25, mean 33, PCWP mean 18  . NICM (nonischemic cardiomyopathy) (HCC)   . Substance abuse (HCC)    cocaine, marijuana    Past Surgical History:  Procedure Laterality Date  . CARDIAC CATHETERIZATION N/A 01/05/2016   Procedure: Right/Left Heart Cath and Coronary Angiography;  Surgeon: Lennette Bihari, MD;  Location: [facility] INVASIVE CV LAB;  Service: Cardiovascular;  Laterality: N/A;  . none      Current Medications: Current Meds  Medication Sig  . albuterol (PROVENTIL HFA;VENTOLIN HFA) 108 (90 Base) MCG/ACT inhaler Inhale 1-2 puffs into the lungs every 6 (six) hours as needed for wheezing or shortness of breath.  Marland Kitchen aspirin 81 MG EC tablet Take 1 tablet (81 mg total) by mouth daily.  . beclomethasone (QVAR) 80 MCG/ACT inhaler Inhale 1 puff into the lungs 2 (two) times daily.     Allergies:   Hydrocodone; Lisinopril; Prednisone; and Penicillins   Social History   Tobacco Use  . Smoking status: Current Every Day Smoker    Packs/day: 0.50    Years: 20.00    Pack years: 10.00    Types: Cigarettes  . Smokeless tobacco: Never Used  Substance Use Topics  . Alcohol use: Yes    Alcohol/week: 3.0 oz    Types: 5 Shots of liquor per week    Comment: 5-6 shots of vodka daily  . Drug use: Yes    Types: Cocaine, Marijuana     Family Hx: The patient's family history includes Cardiomyopathy in his father; Heart failure in his father; Hypertension in his father. There is no history of Deep vein thrombosis.  ROS:   Please see the history of present illness.    Review of Systems  Cardiovascular: Positive for chest pain and dyspnea on exertion.  Respiratory: Positive for shortness of breath.    All other systems reviewed and are negative.   EKGs/Labs/Other Test Reviewed:    EKG:  EKG is  ordered today.  The ekg ordered today demonstrates sinus tachycardia, heart rate 104, rightward axis, nonspecific ST-T wave changes, LVH, QTC 460 ms  Recent Labs: 09/28/2017: ALT 250; B  Natriuretic Peptide 1,504.5; Hemoglobin 14.3; Platelets 176 09/30/2017: BUN 28; Creatinine, Ser 1.49; Potassium 4.5; Sodium 135   Recent Lipid Panel Lab Results  Component Value Date/Time   CHOL 113 09/28/2017 01:58 AM   TRIG 76 09/28/2017 01:58 AM   HDL 30 (L) 09/28/2017 01:58 AM   CHOLHDL 3.8 09/28/2017 01:58 AM   LDLCALC 68 09/28/2017 01:58 AM    Physical Exam:    VS:  BP 120/80   Pulse (!) 104   Ht 5\' 8"  (1.727 m)   Wt 168 lb 12.8 oz (76.6 kg)   SpO2 98%   BMI 25.67 kg/m     Wt Readings from Last 3 Encounters:  10/09/17 168 lb 12.8 oz (76.6 kg)  09/29/17 165 lb 5.5 oz (75 kg)  09/22/17 170 lb 11.2 oz (77.4 kg)     Physical Exam  Constitutional: He is oriented to  person, place, and time. He appears well-developed and well-nourished. No distress.  HENT:  Head: Normocephalic and atraumatic.  Neck: No JVD present.  Cardiovascular: Normal rate and regular rhythm.  No murmur heard. Pulmonary/Chest: Effort normal. He has no rales.  Abdominal: Soft. He exhibits distension.  Musculoskeletal: He exhibits no edema.  Neurological: He is alert and oriented to person, place, and time.  Skin: Skin is warm and dry.    ASSESSMENT & PLAN:    1.  Chronic systolic CHF (congestive heart failure) (HCC)  EF 10-15 by most recent echocardiogram dated 09/28/17.  He is NYHA 3.  On exam, he does not appear to be significantly volume overloaded.  However, he has recently been admitted with decompensated heart failure in the setting of medication nonadherence.  He has not been taking any medication since his most recent admission aside from aspirin and inhaler.  I suspect he will need daily diuretic therapy to maintain normal volume status.  His heart rate is also elevated.  He has a history of cocaine abuse and a nonselective beta-blocker has been used in the past.  It sounds as though he had some type of reaction to BiDil.  He has a known history of anaphylaxis to ACE inhibitor.  -Restart Lasix 40  mg daily  -Restart carvedilol 3.125 mg twice daily  -Follow-up BMET 1 week  -Close follow-up with office visit 2-weeks  2.  NICM (nonischemic cardiomyopathy) (HCC) Cardiac catheterization 2017 with normal coronary arteries.  Currently, he is not a candidate for ICD given prior substance abuse.  If his EF does not recover and he is able to remain abstinent, we can refer him to EP.  3.  History of right MCA stroke Neurology felt that his stroke was likely related to his cardiomyopathy.  However, he is felt to be a poor candidate for anticoagulation.  He has not used cocaine since his most recent admission to the hospital.  If he remains abstinent, anticoagulation could be considered in the future.  Inpatient rehab was recommended after his recent stroke.  However, he left AMA.  If he continues to have issues with swallowing and left-sided weakness.  He sees primary care later this week.  He can obtain referral to outpatient rehab (speech, OT, PT, etc.) with his PCP.  4.  CKD (chronic kidney disease) stage 2, GFR 60-89 ml/min  Obtain follow-up BMET 1 week after resuming Lasix.  5.  Cocaine abuse [facility]) He notes that he has not used cocaine since he was discharged from the hospital recently.  I have encouraged him to continue to abstain from using cocaine.  6.  Uncomplicated asthma, unspecified asthma severity, unspecified whether persistent He likely has an element of COPD.  He had a recent CT scan that demonstrated emphysematous changes.  He has noted some probable hemoptysis recently.  There was no evidence of pulmonary nodule or mass on his CT scan.  He has follow-up later this week with primary care.  He may need referral to pulmonology.  I will defer this to his primary care provider.  7.  Tobacco abuse He has quit smoking.   Dispo:  Return in about 2 weeks (around 10/23/2017) for Close Follow Up w/ Dr. Clifton James, or Tereso Newcomer, PA-C.   Medication Adjustments/Labs and Tests  Ordered: Current medicines are reviewed at length with the patient today.  Concerns regarding medicines are outlined above.  Tests Ordered: Orders Placed This Encounter  Procedures  . Basic metabolic panel  . EKG 12-Lead  Medication Changes: Meds ordered this encounter  Medications  . carvedilol (COREG) 3.125 MG tablet    Sig: Take 1 tablet (3.125 mg total) by mouth 2 (two) times daily.    Dispense:  60 tablet    Refill:  11    Order Specific Question:   Supervising Provider    Answer:   Regan Lemming [[id number]]  . furosemide (LASIX) 40 MG tablet    Sig: Take 1 tablet (40 mg total) by mouth daily.    Dispense:  30 tablet    Refill:  11    Order Specific Question:   Supervising Provider    Answer:   Regan Lemming [[id number]]    Signed, Tereso Newcomer, PA-C  10/09/2017 2:55 PM    [facility] [address] [place], [place], [place]  [address] Phone: [phone]; Fax: [phone]

## 2017-10-09 NOTE — Patient Instructions (Addendum)
Medication Instructions:  Start Lasix (Furosemide) 40 mg Once daily (fluid pill) Start Coreg (Carvedilol) 3.125 mg Twice daily (heart failure pill)  Labwork: In 1 week - BMET   Testing/Procedures: None   Follow-Up: YOU HAVE A FOLLOW UP WITH B[facility] Ausborn [facility] 10/23/17 @ 11:45   Any Other Special Instructions Will Be Listed Below (If Applicable). Keep your appointment with the primary care provider later this week.  At that appointment, make sure you discuss: 1. Referral to outpatient rehab (speech therapy, occupational therapy, physical therapy) 2. Refills on your asthma medications 3. Your headaches  If you need a refill on your cardiac medications before your next appointment, please call your pharmacy.

## 2017-10-09 NOTE — Telephone Encounter (Signed)
I s/w pt and went over recommendations per Tereso Newcomer, PA and Dr. Clifton James to refer pt to Heart Failure Clinic. I explained to the pt per PA HF Clinic will be better suited to handle his HF. Pt aware referral has been placed today and our office or the HF Clinic will call him to schedule an appt. I did advise pt if he cannot be seen in the next 2 weeks with the HF clinic then he is to keep his appt withy Scott 3/4, if he can see the HF clinic in the next 2 weeks then we can cancel 3/4 Scott W. PA. Pt is agreeable to plan of care.

## 2017-10-11 ENCOUNTER — Emergency Department (HOSPITAL_COMMUNITY): Payer: Self-pay

## 2017-10-11 ENCOUNTER — Encounter (HOSPITAL_COMMUNITY): Payer: Self-pay | Admitting: Emergency Medicine

## 2017-10-11 ENCOUNTER — Emergency Department (HOSPITAL_COMMUNITY)
Admission: EM | Admit: 2017-10-11 | Discharge: 2017-10-11 | Disposition: A | Discharge status: Home / Self Care | Payer: Self-pay | Attending: Emergency Medicine | Admitting: Emergency Medicine

## 2017-10-11 ENCOUNTER — Other Ambulatory Visit: Payer: Self-pay

## 2017-10-11 DIAGNOSIS — R040 Epistaxis: Secondary | ICD-10-CM | POA: Insufficient documentation

## 2017-10-11 DIAGNOSIS — N182 Chronic kidney disease, stage 2 (mild): Secondary | ICD-10-CM | POA: Insufficient documentation

## 2017-10-11 DIAGNOSIS — Z8673 Personal history of transient ischemic attack (TIA), and cerebral infarction without residual deficits: Secondary | ICD-10-CM | POA: Insufficient documentation

## 2017-10-11 DIAGNOSIS — I5022 Chronic systolic (congestive) heart failure: Secondary | ICD-10-CM | POA: Insufficient documentation

## 2017-10-11 DIAGNOSIS — R05 Cough: Secondary | ICD-10-CM | POA: Insufficient documentation

## 2017-10-11 DIAGNOSIS — J45909 Unspecified asthma, uncomplicated: Secondary | ICD-10-CM | POA: Insufficient documentation

## 2017-10-11 DIAGNOSIS — F17210 Nicotine dependence, cigarettes, uncomplicated: Secondary | ICD-10-CM | POA: Insufficient documentation

## 2017-10-11 DIAGNOSIS — R531 Weakness: Secondary | ICD-10-CM | POA: Insufficient documentation

## 2017-10-11 DIAGNOSIS — F1410 Cocaine abuse, uncomplicated: Secondary | ICD-10-CM | POA: Insufficient documentation

## 2017-10-11 DIAGNOSIS — Z79899 Other long term (current) drug therapy: Secondary | ICD-10-CM | POA: Insufficient documentation

## 2017-10-11 DIAGNOSIS — F1210 Cannabis abuse, uncomplicated: Secondary | ICD-10-CM | POA: Insufficient documentation

## 2017-10-11 DIAGNOSIS — R0602 Shortness of breath: Secondary | ICD-10-CM | POA: Insufficient documentation

## 2017-10-11 DIAGNOSIS — Z7982 Long term (current) use of aspirin: Secondary | ICD-10-CM | POA: Insufficient documentation

## 2017-10-11 DIAGNOSIS — R059 Cough, unspecified: Secondary | ICD-10-CM

## 2017-10-11 DIAGNOSIS — R0981 Nasal congestion: Secondary | ICD-10-CM | POA: Insufficient documentation

## 2017-10-11 LAB — I-STAT TROPONIN, ED: TROPONIN I, POC: 0.01 ng/mL (ref 0.00–0.08)

## 2017-10-11 LAB — URINALYSIS, ROUTINE W REFLEX MICROSCOPIC
BILIRUBIN URINE: NEGATIVE
Glucose, UA: NEGATIVE mg/dL
HGB URINE DIPSTICK: NEGATIVE
KETONES UR: 5 mg/dL — AB
Leukocytes, UA: NEGATIVE
Nitrite: NEGATIVE
PROTEIN: 100 mg/dL — AB
Specific Gravity, Urine: 1.031 — ABNORMAL HIGH (ref 1.005–1.030)
pH: 5 (ref 5.0–8.0)

## 2017-10-11 LAB — RAPID URINE DRUG SCREEN, HOSP PERFORMED
AMPHETAMINES: NOT DETECTED
BENZODIAZEPINES: NOT DETECTED
Barbiturates: NOT DETECTED
COCAINE: NOT DETECTED
Opiates: NOT DETECTED
Tetrahydrocannabinol: NOT DETECTED

## 2017-10-11 LAB — COMPREHENSIVE METABOLIC PANEL
ALT: 44 U/L (ref 17–63)
ANION GAP: 12 (ref 5–15)
AST: 53 U/L — ABNORMAL HIGH (ref 15–41)
Albumin: 3.2 g/dL — ABNORMAL LOW (ref 3.5–5.0)
Alkaline Phosphatase: 126 U/L (ref 38–126)
BUN: 24 mg/dL — ABNORMAL HIGH (ref 6–20)
CO2: 22 mmol/L (ref 22–32)
Calcium: 9.2 mg/dL (ref 8.9–10.3)
Chloride: 104 mmol/L (ref 101–111)
Creatinine, Ser: 1.48 mg/dL — ABNORMAL HIGH (ref 0.61–1.24)
GFR, EST NON AFRICAN AMERICAN: 56 mL/min — AB (ref >60)
Glucose, Bld: 91 mg/dL (ref 65–99)
POTASSIUM: 5.7 mmol/L — AB (ref 3.5–5.1)
Sodium: 138 mmol/L (ref 135–145)
TOTAL PROTEIN: 6.9 g/dL (ref 6.5–8.1)
Total Bilirubin: 1.8 mg/dL — ABNORMAL HIGH (ref 0.3–1.2)

## 2017-10-11 LAB — CBC
HEMATOCRIT: 45 % (ref 39.0–52.0)
Hemoglobin: 14.6 g/dL (ref 13.0–17.0)
MCH: 25.7 pg — ABNORMAL LOW (ref 26.0–34.0)
MCHC: 32.4 g/dL (ref 30.0–36.0)
MCV: 79.1 fL (ref 78.0–100.0)
Platelets: 196 10*3/uL (ref 150–400)
RBC: 5.69 MIL/uL (ref 4.22–5.81)
RDW: 14.8 % (ref 11.5–15.5)
WBC: 7.2 10*3/uL (ref 4.0–10.5)

## 2017-10-11 LAB — ETHANOL

## 2017-10-11 MED ORDER — ALBUTEROL SULFATE (2.5 MG/3ML) 0.083% IN NEBU
5.0000 mg | INHALATION_SOLUTION | Freq: Once | RESPIRATORY_TRACT | Status: AC
  Administered 2017-10-11: 5 mg via RESPIRATORY_TRACT
  Filled 2017-10-11: qty 6

## 2017-10-11 MED ORDER — FUROSEMIDE 10 MG/ML IJ SOLN
40.0000 mg | Freq: Once | INTRAMUSCULAR | Status: AC
  Administered 2017-10-11: 40 mg via INTRAVENOUS
  Filled 2017-10-11: qty 4

## 2017-10-11 MED ORDER — LEVOFLOXACIN 750 MG PO TABS: 750.0000 mg | ORAL_TABLET | Freq: Every day | ORAL | 0 refills | Status: DC

## 2017-10-11 MED ORDER — IPRATROPIUM BROMIDE 0.02 % IN SOLN
0.5000 mg | Freq: Once | RESPIRATORY_TRACT | Status: AC
  Administered 2017-10-11: 0.5 mg via RESPIRATORY_TRACT
  Filled 2017-10-11: qty 2.5

## 2017-10-11 MED ORDER — LEVOFLOXACIN 750 MG PO TABS
750.0000 mg | ORAL_TABLET | Freq: Once | ORAL | Status: AC
  Administered 2017-10-11: 750 mg via ORAL
  Filled 2017-10-11: qty 1

## 2017-10-11 NOTE — ED Provider Notes (Signed)
[name] EMERGENCY DEPARTMENT Provider Note   CSN: [id number] Arrival date & time: 10/11/17  1314     History   Chief Complaint Chief Complaint  Patient presents with  . Weakness    HPI Mark [name] is a 42 y.o. male.  Patient with history of asthma, chronic systolic heart failure, recent admission for stroke with mild residual left facial weakness and numbness, left upper extremity weakness the patient states has been gradually improving since discharge on 10/01/17 --presents with complaint of generalized weakness.  Patient reports waking up this morning and being very short of breath.  He had needed to use a family member's nebulizer machine.  This helped his symptoms.  He denies any fevers.  He has had some nasal congestion and nosebleeds today.  No chest pain.  No abdominal pain, vomiting, or diarrhea.  He does report alcohol use in the past week.  He is on aspirin only.  The onset of this condition was acute. The course is constant. Aggravating factors: none. Alleviating factors: albuterol.  Patient is very clear that left upper extremity and facial symptoms are not worsening.       Past Medical History:  Diagnosis Date  . Asthma   . Chronic systolic CHF (congestive heart failure) (HCC)   . Cigarette smoker   . History of echocardiogram    a. Echo 5/17 - EF 20-25%, severe diff HK, restrictive physiology, mild to mod MR, severe reduced RVSF, mod RVE, mild RAE, mod TR, PASP 48 mmHg  . Hx of cardiac cath    a. LHC 5/17 - normal coronary arteries. PA 45/25, mean 33, PCWP mean 18  . NICM (nonischemic cardiomyopathy) (HCC)   . Substance abuse (HCC)    cocaine, marijuana    Patient Active Problem List   Diagnosis Date Noted  . History of right MCA stroke 09/28/2017  . Stroke (HCC) 09/28/2017  . Chronic systolic CHF (congestive heart failure) (HCC) 01/11/2016  . NICM (nonischemic cardiomyopathy) (HCC) 01/11/2016  . CKD (chronic kidney disease) stage  2, GFR 60-89 ml/min 01/11/2016  . Cocaine abuse (HCC) 01/11/2016  . Chest pain, pleuritic 01/03/2016  . Tobacco abuse 01/03/2016  . Asthma 01/03/2016  . Leg swelling 01/03/2016  . Tachycardia 01/03/2016  . Normocytic anemia 01/03/2016  . Elevated troponin 01/03/2016  . Right rib fracture 01/03/2016  . Chest pain     Past Surgical History:  Procedure Laterality Date  . CARDIAC CATHETERIZATION N/A 01/05/2016   Procedure: Right/Left Heart Cath and Coronary Angiography;  Surgeon: Lennette Bihari, MD;  Location: [facility] INVASIVE CV LAB;  Service: Cardiovascular;  Laterality: N/A;  . none         Home Medications    Prior to Admission medications   Medication Sig Start Date End Date Taking? Authorizing Provider  albuterol (PROVENTIL HFA;VENTOLIN HFA) 108 (90 Base) MCG/ACT inhaler Inhale 1-2 puffs into the lungs every 6 (six) hours as needed for wheezing or shortness of breath. 09/22/17   Rai, Delene Ruffini, MD  aspirin 81 MG EC tablet Take 1 tablet (81 mg total) by mouth daily. 09/22/17   Rai, Ripudeep Kirtland Bouchard, MD  beclomethasone (QVAR) 80 MCG/ACT inhaler Inhale 1 puff into the lungs 2 (two) times daily. 09/22/17   Rai, Delene Ruffini, MD  carvedilol (COREG) 3.125 MG tablet Take 1 tablet (3.125 mg total) by mouth 2 (two) times daily. 10/09/17 10/04/18  Tereso Newcomer T, PA-C  furosemide (LASIX) 40 MG tablet Take 1 tablet (40 mg total) by  mouth daily. 10/09/17 10/04/18  Tereso Newcomer T, PA-C  levofloxacin (LEVAQUIN) 750 MG tablet Take 1 tablet (750 mg total) by mouth daily. 10/11/17   Renne Crigler, PA-C    Family History Family History  Problem Relation Age of Onset  . Cardiomyopathy Father        Reports his father has an LVAD  . Heart failure Father   . Hypertension Father   . Deep vein thrombosis Neg Hx     Social History Social History   Tobacco Use  . Smoking status: Current Every Day Smoker    Packs/day: 0.50    Years: 20.00    Pack years: 10.00    Types: Cigarettes  . Smokeless tobacco: Never  Used  Substance Use Topics  . Alcohol use: Yes    Alcohol/week: 3.0 oz    Types: 5 Shots of liquor per week    Comment: 5-6 shots of vodka daily  . Drug use: Yes    Types: Cocaine, Marijuana     Allergies   Hydrocodone; Lisinopril; Prednisone; and Penicillins   Review of Systems Review of Systems  Constitutional: Negative for fever.  HENT: Positive for congestion. Negative for rhinorrhea and sore throat.   Eyes: Negative for redness.  Respiratory: Positive for cough and shortness of breath.   Cardiovascular: Negative for chest pain.  Gastrointestinal: Negative for abdominal pain, diarrhea, nausea and vomiting.  Genitourinary: Negative for dysuria.  Musculoskeletal: Negative for myalgias.  Skin: Negative for rash.  Neurological: Positive for weakness. Negative for headaches.     Physical Exam Updated Vital Signs BP (!) 112/95   Pulse (!) 51   Temp 97.9 F (36.6 C) (Oral)   Resp (!) 32   SpO2 100%   Physical Exam  Constitutional: He appears well-developed and well-nourished.  HENT:  Head: Normocephalic and atraumatic.  Mouth/Throat: Oropharynx is clear and moist.  Eyes: Conjunctivae are normal. Right eye exhibits no discharge. Left eye exhibits no discharge.  Neck: Normal range of motion. Neck supple.  Cardiovascular: Normal rate, regular rhythm and normal heart sounds.  No murmur heard. Pulmonary/Chest: Effort normal. No stridor. No respiratory distress. He has decreased breath sounds (generalized). He has no wheezes.  Abdominal: Soft. There is no tenderness. There is no rebound and no guarding.  Musculoskeletal: Normal range of motion. He exhibits no edema.  Neurological: He is alert.  Trace weakness left upper extremity strength, normal left lower extremity strength  Skin: Skin is warm and dry.  Psychiatric: He has a normal mood and affect.  Nursing note and vitals reviewed.    ED Treatments / Results  Labs (all labs ordered are listed, but only abnormal  results are displayed) Labs Reviewed  URINALYSIS, ROUTINE W REFLEX MICROSCOPIC - Abnormal; Notable for the following components:      Result Value   Color, Urine AMBER (*)    Specific Gravity, Urine 1.031 (*)    Ketones, ur 5 (*)    Protein, ur 100 (*)    Bacteria, UA RARE (*)    Squamous Epithelial / LPF 0-5 (*)    All other components within normal limits  COMPREHENSIVE METABOLIC PANEL - Abnormal; Notable for the following components:   Potassium 5.7 (*)    BUN 24 (*)    Creatinine, Ser 1.48 (*)    Albumin 3.2 (*)    AST 53 (*)    Total Bilirubin 1.8 (*)    GFR calc non Af Amer 56 (*)    All other components within  normal limits  CBC - Abnormal; Notable for the following components:   MCH 25.7 (*)    All other components within normal limits  ETHANOL  RAPID URINE DRUG SCREEN, HOSP PERFORMED  I-STAT TROPONIN, ED    EKG  EKG Interpretation  Date/Time:  Wednesday October 11 2017 20:02:57 EST Ventricular Rate:  101 PR Interval:    QRS Duration: 85 QT Interval:  355 QTC Calculation: 461 R Axis:   62 Text Interpretation:  Sinus tachycardia Low voltage, extremity leads Anteroseptal infarct, old Borderline repolarization abnormality Baseline wander in lead(s) V4 Confirmed by Raeford Razor [phone]) on 10/11/2017 9:26:56 PM       Radiology Dg Chest 2 View  Result Date: 10/11/2017 CLINICAL DATA:  Shortness of breath with wheezing EXAM: CHEST  2 VIEW COMPARISON:  09/28/2017, CT chest 09/21/2017 FINDINGS: Hyperinflation with emphysematous disease. Streaky atelectasis or mild infiltrate at the left lung base. Mild cardiomegaly. No pleural effusion. Mild aortic atherosclerosis. No pneumothorax. IMPRESSION: 1. Hyperinflation with emphysematous disease. 2. Streaky atelectasis or infiltrate at the left lung base 3. Cardiomegaly Electronically Signed   By: Jasmine Pang M.D.   On: 10/11/2017 20:20    Procedures Procedures (including critical care time)  Medications Ordered in  ED Medications  albuterol (PROVENTIL) (2.5 MG/3ML) 0.083% nebulizer solution 5 mg (5 mg Nebulization Given 10/11/17 1339)  albuterol (PROVENTIL) (2.5 MG/3ML) 0.083% nebulizer solution 5 mg (5 mg Nebulization Given 10/11/17 1935)  ipratropium (ATROVENT) nebulizer solution 0.5 mg (0.5 mg Nebulization Given 10/11/17 2204)  furosemide (LASIX) injection 40 mg (40 mg Intravenous Given 10/11/17 2214)  levofloxacin (LEVAQUIN) tablet 750 mg (750 mg Oral Given 10/11/17 2214)     Initial Impression / Assessment and Plan / ED Course  I have reviewed the triage vital signs and the nursing notes.  Pertinent labs & imaging results that were available during my care of the patient were reviewed by me and considered in my medical decision making (see chart for details).     Patient seen and examined. Work-up initiated. Medications ordered. Breathing treatment and CXR ordered.   Vital signs reviewed and are as follows: BP 104/90   Pulse 97   Temp 98.4 F (36.9 C)   Resp 20   SpO2 99%   Reviewed EKG and labs with Dr. Juleen China.  Mild hyperkalemia noted with out any new EKG findings.  Patient has been given albuterol.  Also will be given a dose of Lasix here.  Patient saw his cardiologist 2 days ago.  At that time he had not been on his medications.  Patient states that he restarted his Lasix, however has been having difficulties getting his other medications due to financial inability.  Will place case manager consult.  Patient voices that he is afraid he is going to have severe shortness of breath at home because of intermittent wheezing episodes.  He does have albuterol that he says helps sometimes.  Currently, on exam is improved without wheezing.  Improved air movement from arrival.  10:54 PM patient stable.  Ready for discharge.  Case management consult placed.  Final Clinical Impressions(s) / ED Diagnoses   Final diagnoses:  Generalized weakness  Cough   Patient with recent stroke, h/o CHF,  presents with cough and generalized weakness.  Also complains of wheezing and shortness of breath at home.  Patient is clear that he has not had any worsening of his recent stroke symptoms or neurological deficits.  Weakness is generalized and he is also scared of episodes of  shortness of breath and wheezing and occur when he first wakes up.  Chest x-ray demonstrates no signs of fluid overload but does possibly show pneumonia.  Patient covered with Levaquin.  He will continue albuterol at home. The patient has recently had difficulty obtaining medications.  Does state that he restarted Lasix.  Mild hyperkalemia without EKG changes, treated with Lasix in the ED and albuterol.  No chest pain or other signs or symptoms concerning for ACS or PE.  Do not feel the patient requires admission to the hospital today.  Encourage close follow-up.  Case management consult ordered to address medication procurement issues.  ED Discharge Orders        Ordered    levofloxacin (LEVAQUIN) 750 MG tablet  Daily     10/11/17 2249       Renne Crigler, Cordelia Poche 10/11/17 2320    Raeford Razor, MD 10/11/17 2321

## 2017-10-11 NOTE — ED Triage Notes (Signed)
Per EMS:  Patient presents to ED for assessment of generalized weakness for 3 days after being d/c'd on the 10th for a CVA.  Patient c/o intermittent CP with palpitations.  Denies at this time.  C/o increased SOB.  BP soft.  Stroke scale with EMS negative.  Dr. Denton Lank at bedside at this time

## 2017-10-11 NOTE — Discharge Instructions (Signed)
Please read and follow all provided instructions.  Your diagnoses today include:  1. Generalized weakness   2. Cough    Tests performed today include:  Chest x-ray --shows possible pneumonia  Blood counts and electrolytes -normal kidneys, high potassium  Urine test  Test for heart muscle damage or heart attack -normal  Vital signs. See below for your results today.   Medications prescribed:   Levofloxacin -medication for possible pneumonia  Take any prescribed medications only as directed.  Home care instructions:  Follow any educational materials contained in this packet.  Please take your medications as directed by your doctors.  The Lasix will help with your high potassium.   Follow-up instructions: Please follow-up with your primary care provider in the next 3 days for further evaluation of your symptoms.   Return instructions:   Please return to the Emergency Department if you experience worsening symptoms.   Return with chest pain, worsening shortness of breath or wheezing, lightheadedness or passing out.  Please return if you have any other emergent concerns.  Additional Information:  Your vital signs today were: BP 104/90    Pulse 97    Temp 98.4 F (36.9 C)    Resp 20    SpO2 99%  If your blood pressure (BP) was elevated above 135/85 this visit, please have this repeated by your doctor within one month. --------------

## 2017-10-11 NOTE — ED Provider Notes (Signed)
Brief screening note Patient w recent r mca, mild residual left weakness, c/o feeling generally weak for past couple days. Denies unilateral numbness/weakness. No change in speech or vision. States poor po intake in past day, due to poor appetite. Denies abd pain. No fevers. No headache. Hx asthma, ems noted mild wheezing, and c/o chest tightness earlier. No current cp.  Labs, ecg, ordered.  Po fluids.    Cathren Laine, MD 10/11/17 1329

## 2017-10-11 NOTE — ED Notes (Signed)
Patient aware of need for urine sample.  States he cannot provide at this time.  Bag and wipes provided.  Patient's EKG and breathing treatment supplies are in the back of his wheelchair.    Patient has IV, started by EMS.  Aware of need to have it removed if deciding to leave.  Nurse First RN aware of patient's IV and location.

## 2017-10-11 NOTE — ED Notes (Signed)
Patient transported to X-ray 

## 2017-10-12 ENCOUNTER — Ambulatory Visit (INDEPENDENT_AMBULATORY_CARE_PROVIDER_SITE_OTHER): Payer: Self-pay | Admitting: Family Medicine

## 2017-10-12 ENCOUNTER — Encounter: Payer: Self-pay | Admitting: *Deleted

## 2017-10-12 ENCOUNTER — Encounter: Payer: Self-pay | Admitting: Family Medicine

## 2017-10-12 VITALS — BP 102/70 | HR 56 | Temp 97.3°F | Resp 14 | Ht 68.0 in | Wt 166.0 lb

## 2017-10-12 DIAGNOSIS — R0609 Other forms of dyspnea: Secondary | ICD-10-CM

## 2017-10-12 DIAGNOSIS — N182 Chronic kidney disease, stage 2 (mild): Secondary | ICD-10-CM

## 2017-10-12 DIAGNOSIS — J181 Lobar pneumonia, unspecified organism: Secondary | ICD-10-CM

## 2017-10-12 DIAGNOSIS — J189 Pneumonia, unspecified organism: Secondary | ICD-10-CM

## 2017-10-12 DIAGNOSIS — Z8673 Personal history of transient ischemic attack (TIA), and cerebral infarction without residual deficits: Secondary | ICD-10-CM

## 2017-10-12 DIAGNOSIS — J449 Chronic obstructive pulmonary disease, unspecified: Secondary | ICD-10-CM

## 2017-10-12 DIAGNOSIS — Z87891 Personal history of nicotine dependence: Secondary | ICD-10-CM

## 2017-10-12 DIAGNOSIS — R7303 Prediabetes: Secondary | ICD-10-CM

## 2017-10-12 MED ORDER — BUDESONIDE-FORMOTEROL FUMARATE 80-4.5 MCG/ACT IN AERO: 2.0000 | INHALATION_SPRAY | Freq: Two times a day (BID) | RESPIRATORY_TRACT | 3 refills | Status: DC

## 2017-10-12 MED ORDER — ALBUTEROL SULFATE HFA 108 (90 BASE) MCG/ACT IN AERS: 1.0000 | INHALATION_SPRAY | RESPIRATORY_TRACT | 4 refills | Status: DC | PRN

## 2017-10-12 MED ORDER — LEVOFLOXACIN 750 MG PO TABS: 750.0000 mg | ORAL_TABLET | Freq: Every day | ORAL | 0 refills | Status: DC

## 2017-10-12 MED ORDER — METFORMIN HCL 500 MG PO TABS: 500.0000 mg | ORAL_TABLET | Freq: Every day | ORAL | 1 refills | Status: DC

## 2017-10-12 MED ORDER — MONTELUKAST SODIUM 10 MG PO TABS: 10.0000 mg | ORAL_TABLET | Freq: Every day | ORAL | 3 refills | Status: DC

## 2017-10-12 MED FILL — !VENTOLIN HFA INHALER: 108 (90 BAS | 16 days supply | Qty: 18 | Fill #0

## 2017-10-12 MED FILL — ?MONTELUKAST SOD 10 MG TAB: 10 | 30 days supply | Qty: 30 | Fill #0

## 2017-10-12 MED FILL — SYMBICORT 80-4.5 MCG INH: 80-4.5 | 30 days supply | Qty: 10 | Fill #0

## 2017-10-12 MED FILL — metFORMIN HCL 500 MG TABS: 500 | 30 days supply | Qty: 30 | Fill #0

## 2017-10-12 NOTE — Progress Notes (Signed)
Chief Complaint  Patient presents with  . Establish Care    recent stroke   . Wheezing  . Asthma    needs rx for albuterol nebulizer   . Weight Loss   Subjective:    Patient ID: Mark [name], male    DOB: 12/16/1975, 42 y.o.   MRN: [id number]  HPI Mark [name], a 42 year old male presents to establish care. Patient has not had a primary care provider and has mostly been using the emergency department and urgent care for all primary needs. Mr. Lojek has a history of CVA, CKD, CHF, cocaine use, and tobacco dependence. Patient was admitted to inpatient services from 09/28/2017-10/01/2017. Patient left on 10/01/2017 against medical advice. Patient was admitted for acute respiratory distress. Patient's urine drug screen was noted to be positive for cocaine and THC use. Patient says that he typically does not use cocaine, but was celebrating his uncle's birthday. Chest xray was unremarkable however brain MRI showed a moderate to lare right MCA distribution infarct. Patient's troponin was also elevated, which was thought to be due to cocaine use.  Patient has chronic heart failure with reduced EF 10-15 % by echocardiogram. Patient establish care with cardiology on 10/09/2017.  A cardiac catheterization demonstrated normal coronary arteries during inpatient services.  Prior to cardiology visit patient had not been taking medications other than aspirin 81 mg and albuterol inhaler.  Patient was restarted on furosemide 40 mg daily, carvedilol 3.125 mg twice daily, and is scheduled to follow-up in cardiology in 1 week.  Patient says that he has been taking medications consistently.  He currently denies chest pains, heart palpitations, bilateral lower extremity edema, dizziness, or syncope.  Patient endorses fatigue today. Patient was also evaluated in the emergency department on 10/11/2017.  Patient was seen for increased weakness for 3-4 days.  Patient also endorses poor appetite.  On chest x-ray patient was shown  to have hyperinflation with emphysematous disease, streaky atelectasis or infiltrate of the left lung base and cardiomegaly.  Chest x-ray showed no signs of fluid overload but possible pneumonia.  Patient was prescribed Levaquin but has not started medication.  He endorses fatigue, chest tightness, shortness of breath, and wheezing. Past Medical History:  Diagnosis Date  . Asthma   . Chronic systolic CHF (congestive heart failure) (HCC)   . Cigarette smoker   . History of echocardiogram    a. Echo 5/17 - EF 20-25%, severe diff HK, restrictive physiology, mild to mod MR, severe reduced RVSF, mod RVE, mild RAE, mod TR, PASP 48 mmHg  . Hx of cardiac cath    a. LHC 5/17 - normal coronary arteries. PA 45/25, mean 33, PCWP mean 18  . NICM (nonischemic cardiomyopathy) (HCC)   . Substance abuse (HCC)    cocaine, marijuana   Social History   Socioeconomic History  . Marital status: Single    Spouse name: Not on file  . Number of children: Not on file  . Years of education: Not on file  . Highest education level: Not on file  Social Needs  . Financial resource strain: Not on file  . Food insecurity - worry: Not on file  . Food insecurity - inability: Not on file  . Transportation needs - medical: Not on file  . Transportation needs - non-medical: Not on file  Occupational History  . Not on file  Tobacco Use  . Smoking status: Former Smoker    Packs/day: 0.50    Years: 20.00    Pack  years: 10.00    Types: Cigarettes  . Smokeless tobacco: Never Used  Substance and Sexual Activity  . Alcohol use: Yes    Alcohol/week: 3.0 oz    Types: 5 Shots of liquor per week    Frequency: Never    Comment: 5-6 shots of vodka daily. denies use today 10/12/2017  . Drug use: Yes    Types: Cocaine, Marijuana    Comment: not now-10/12/2017  . Sexual activity: Not on file  Other Topics Concern  . Not on file  Social History Narrative  . Not on file    There is no immunization history on file for  this patient.   Review of Systems  Constitutional: Positive for fatigue. Negative for fever and unexpected weight change.  HENT: Negative.  Negative for congestion, dental problem, postnasal drip and trouble swallowing.   Eyes: Negative for photophobia and visual disturbance.  Respiratory: Positive for chest tightness, shortness of breath and wheezing.   Cardiovascular: Negative.  Negative for chest pain.  Gastrointestinal: Negative for abdominal distention and abdominal pain.  Endocrine: Negative.  Negative for polydipsia, polyphagia and polyuria.  Genitourinary: Negative.   Musculoskeletal: Negative.   Skin: Negative.   Allergic/Immunologic: Negative for immunocompromised state.  Neurological: Negative.   Hematological: Negative.   Psychiatric/Behavioral: Negative.        Objective:   Physical Exam  Constitutional: He is oriented to person, place, and time. He appears well-developed and well-nourished.  HENT:  Head: Normocephalic and atraumatic.  Right Ear: External ear normal.  Left Ear: External ear normal.  Nose: Nose normal.  Mouth/Throat: Oropharynx is clear and moist.  Eyes: Pupils are equal, round, and reactive to light.  Neck: Normal range of motion. Neck supple.  Cardiovascular: Normal rate, regular rhythm, normal heart sounds and intact distal pulses.  Pulmonary/Chest: Effort normal and breath sounds normal.  Abdominal: Soft. Bowel sounds are normal.  Musculoskeletal: Normal range of motion.  Neurological: He is alert and oriented to person, place, and time.  Skin: Skin is warm and dry.  Psychiatric: He has a normal mood and affect. His behavior is normal. Judgment and thought content normal.         BP 102/70 (BP Location: Left Arm, Patient Position: Sitting, Cuff Size: Normal)   Pulse (!) 56   Temp (!) 97.3 F (36.3 C) (Oral)   Resp 14   Ht 5\' 8"  (1.727 m)   Wt 166 lb (75.3 kg)   SpO2 100%   BMI 25.24 kg/m   Assessment & Plan:  1. Dyspnea on  exertion Due to history of shortness of breath on exertion, recommend albuterol 1-2 puffs to the lungs every 4 hours as needed for wheezing, shortness of breath, and/or persistent coughing.  We will also restart Singulair 10 mg daily for history of asthma.  Will also continue  Symbicort 2 puffs twice daily. - montelukast (SINGULAIR) 10 MG tablet; Take 1 tablet (10 mg total) by mouth at bedtime.  Dispense: 30 tablet; Refill: 3  2. Chronic obstructive pulmonary disease, unspecified COPD type (HCC) I suspect that patient has COPD due to long history of smoking.  Will continue inhalers. - albuterol (PROVENTIL HFA;VENTOLIN HFA) 108 (90 Base) MCG/ACT inhaler; Inhale 1-2 puffs into the lungs every 4 (four) hours as needed for wheezing or shortness of breath.  Dispense: 1 Inhaler; Refill: 4 - montelukast (SINGULAIR) 10 MG tablet; Take 1 tablet (10 mg total) by mouth at bedtime.  Dispense: 30 tablet; Refill: 3 - budesonide-formoterol (SYMBICORT) 80-4.5 MCG/ACT  inhaler; Inhale 2 puffs into the lungs 2 (two) times daily.  Dispense: 1 Inhaler; Refill: 3  3. Prediabetes Hemoglobin A1c is 6.4, which is consistent with prediabetes.  Discussed carbohydrate modify diet at length.  We will also start a trial of metformin 500 mg daily with breakfast. - metFORMIN (GLUCOPHAGE) 500 MG tablet; Take 1 tablet (500 mg total) by mouth daily with breakfast.  Dispense: 30 tablet; Refill: 1  4. CKD (chronic kidney disease) stage 2, GFR 60-89 ml/min Review comprehensive metabolic panel, previous CMP showed mildly elevated creatinine.  Will review BMP as results become available.  5. History of stroke Patient has a history of a CVA.  We will continue ASA therapy.  He is scheduled to establish care with neurology on 12/11/2017. Discussed the importance of smoking cessation and routine follow-up in order to achieve positive outcomes. 6. History of tobacco abuse Patient states that he has attempted to quit smoking in the past  without success.  He is not ready to stop now but has cut down on smoking over the past several weeks.  7. Pneumonia of left lower lobe due to infectious organism [facility]) Reviewed chest x-ray from 10/11/2017.  Patient did not pick up Levaquin from pharmacy.  Will send levofloxacin 700 mg take 1 tab daily. Also increase rest, handwashing, and fluid intake - levofloxacin (LEVAQUIN) 750 MG tablet; Take 1 tablet (750 mg total) by mouth daily.  Dispense: 6 tablet; Refill: 0 - budesonide-formoterol (SYMBICORT) 80-4.5 MCG/ACT inhaler; Inhale 2 puffs into the lungs 2 (two) times daily.  Dispense: 1 Inhaler; Refill: 3    RTC: Patient is scheduled to follow-up in cardiology in 1 week to repeat BMP, patient will follow-up with me in 4 weeks for chronic conditions.  Patient also reminded of appointment scheduled to establish care and neurology on 12/11/2017   Nolon Nations  MSN, FNP-C [facility] [address] [place], [place] [address] [phone]

## 2017-10-12 NOTE — Progress Notes (Unsigned)
EDCM consulted to assist with PCP and HH establishment.  EDCM notes pt has PCP appointment with Julianne Handler, NP today.  Pt will have to get [facility] set up from PCP office as he is uninsured without PCP.  Once PCP is onboard, and he applies for [facility] benefits, HH may be arranged from PCP office.

## 2017-10-12 NOTE — Patient Instructions (Addendum)
You will pick up financial assistance forms from the lobby.  Schedule an appointment with financial counselor at [facility] to complete oriented cart assistance program.  Also complete [facility] financial assistance form, there is an area at the bottom with the mailing address.  You have a diagnosis of prediabetes.  The prediabetic range is between 5.7-6.4.  Your hemoglobin A1c is 6.4.  We will start a trial of metformin 500 mg daily.  Take medication every day with breakfast. Continue to follow-up with cardiology for congestive heart failure.  We will continue Coreg 3.125 mg twice daily and Lasix 40 mg daily.  Will discontinue Qvar twice daily and add Symbicort 2 puffs 2 times a day.  Medication has been sent to your pharmacy.  We will continue albuterol 1-2 puffs every 4 hours as needed for persistent coughing, wheezing, and/or shortness of breath.

## 2017-10-16 ENCOUNTER — Other Ambulatory Visit: Payer: Self-pay | Admitting: *Deleted

## 2017-10-16 DIAGNOSIS — N182 Chronic kidney disease, stage 2 (mild): Secondary | ICD-10-CM

## 2017-10-16 DIAGNOSIS — I5022 Chronic systolic (congestive) heart failure: Secondary | ICD-10-CM

## 2017-10-16 MED FILL — levoFLOXacin 750 MG TABS: 750 | 6 days supply | Qty: 6 | Fill #0

## 2017-10-17 LAB — BASIC METABOLIC PANEL
BUN / CREAT RATIO: 17 (ref 9–20)
BUN: 23 mg/dL (ref 6–24)
CHLORIDE: 104 mmol/L (ref 96–106)
CO2: 24 mmol/L (ref 20–29)
Calcium: 9.2 mg/dL (ref 8.7–10.2)
Creatinine, Ser: 1.37 mg/dL — ABNORMAL HIGH (ref 0.76–1.27)
GFR calc Af Amer: 73 mL/min/{1.73_m2} (ref >59)
GFR calc non Af Amer: 63 mL/min/{1.73_m2} (ref >59)
GLUCOSE: 126 mg/dL — AB (ref 65–99)
Potassium: 4.4 mmol/L (ref 3.5–5.2)
SODIUM: 144 mmol/L (ref 134–144)

## 2017-10-19 ENCOUNTER — Inpatient Hospital Stay (HOSPITAL_COMMUNITY): Admission: RE | Admit: 2017-10-19 | Payer: Self-pay | Source: Ambulatory Visit | Admitting: Internal Medicine

## 2017-10-20 ENCOUNTER — Ambulatory Visit: Payer: Self-pay | Attending: Family Medicine

## 2017-10-21 DIAGNOSIS — J449 Chronic obstructive pulmonary disease, unspecified: Secondary | ICD-10-CM

## 2017-10-21 HISTORY — DX: Chronic obstructive pulmonary disease, unspecified: J44.9

## 2017-10-23 ENCOUNTER — Emergency Department (HOSPITAL_COMMUNITY)
Admission: EM | Admit: 2017-10-23 | Discharge: 2017-10-23 | Disposition: A | Discharge status: Home / Self Care | Payer: Self-pay | Attending: Emergency Medicine | Admitting: Emergency Medicine

## 2017-10-23 ENCOUNTER — Other Ambulatory Visit: Payer: Self-pay

## 2017-10-23 ENCOUNTER — Emergency Department (HOSPITAL_COMMUNITY): Payer: Self-pay

## 2017-10-23 ENCOUNTER — Encounter (HOSPITAL_COMMUNITY): Payer: Self-pay | Admitting: Emergency Medicine

## 2017-10-23 ENCOUNTER — Ambulatory Visit: Payer: Self-pay | Admitting: Physician Assistant

## 2017-10-23 DIAGNOSIS — I5022 Chronic systolic (congestive) heart failure: Secondary | ICD-10-CM | POA: Insufficient documentation

## 2017-10-23 DIAGNOSIS — Z7984 Long term (current) use of oral hypoglycemic drugs: Secondary | ICD-10-CM | POA: Insufficient documentation

## 2017-10-23 DIAGNOSIS — J40 Bronchitis, not specified as acute or chronic: Secondary | ICD-10-CM | POA: Insufficient documentation

## 2017-10-23 DIAGNOSIS — R0602 Shortness of breath: Secondary | ICD-10-CM

## 2017-10-23 DIAGNOSIS — Z87891 Personal history of nicotine dependence: Secondary | ICD-10-CM | POA: Insufficient documentation

## 2017-10-23 DIAGNOSIS — R042 Hemoptysis: Secondary | ICD-10-CM | POA: Insufficient documentation

## 2017-10-23 DIAGNOSIS — Z7982 Long term (current) use of aspirin: Secondary | ICD-10-CM | POA: Insufficient documentation

## 2017-10-23 DIAGNOSIS — Z8673 Personal history of transient ischemic attack (TIA), and cerebral infarction without residual deficits: Secondary | ICD-10-CM | POA: Insufficient documentation

## 2017-10-23 DIAGNOSIS — N182 Chronic kidney disease, stage 2 (mild): Secondary | ICD-10-CM | POA: Insufficient documentation

## 2017-10-23 LAB — COMPREHENSIVE METABOLIC PANEL
ALBUMIN: 3.1 g/dL — AB (ref 3.5–5.0)
ALK PHOS: 100 U/L (ref 38–126)
ALT: 30 U/L (ref 17–63)
ANION GAP: 12 (ref 5–15)
AST: 52 U/L — ABNORMAL HIGH (ref 15–41)
BILIRUBIN TOTAL: 2 mg/dL — AB (ref 0.3–1.2)
BUN: 19 mg/dL (ref 6–20)
CALCIUM: 8.9 mg/dL (ref 8.9–10.3)
CO2: 21 mmol/L — AB (ref 22–32)
CREATININE: 1.37 mg/dL — AB (ref 0.61–1.24)
Chloride: 106 mmol/L (ref 101–111)
GFR calc Af Amer: >60 mL/min (ref >60)
GFR calc non Af Amer: >60 mL/min (ref >60)
GLUCOSE: 90 mg/dL (ref 65–99)
Potassium: 5.1 mmol/L (ref 3.5–5.1)
Sodium: 139 mmol/L (ref 135–145)
TOTAL PROTEIN: 6.7 g/dL (ref 6.5–8.1)

## 2017-10-23 LAB — CBC WITH DIFFERENTIAL/PLATELET
BASOS PCT: 0 %
Basophils Absolute: 0 10*3/uL (ref 0.0–0.1)
Eosinophils Absolute: 0 10*3/uL (ref 0.0–0.7)
Eosinophils Relative: 0 %
HEMATOCRIT: 39.5 % (ref 39.0–52.0)
Hemoglobin: 13 g/dL (ref 13.0–17.0)
LYMPHS ABS: 2.4 10*3/uL (ref 0.7–4.0)
Lymphocytes Relative: 42 %
MCH: 26.1 pg (ref 26.0–34.0)
MCHC: 32.9 g/dL (ref 30.0–36.0)
MCV: 79.2 fL (ref 78.0–100.0)
MONOS PCT: 10 %
Monocytes Absolute: 0.6 10*3/uL (ref 0.1–1.0)
NEUTROS ABS: 2.8 10*3/uL (ref 1.7–7.7)
NEUTROS PCT: 48 %
PLATELETS: 181 10*3/uL (ref 150–400)
RBC: 4.99 MIL/uL (ref 4.22–5.81)
RDW: 15.5 % (ref 11.5–15.5)
WBC: 5.8 10*3/uL (ref 4.0–10.5)

## 2017-10-23 LAB — TROPONIN I
Troponin I: 0.17 ng/mL (ref <0.03)
Troponin I: 0.15 ng/mL (ref <0.03)

## 2017-10-23 LAB — BRAIN NATRIURETIC PEPTIDE: B Natriuretic Peptide: 1458.3 pg/mL — ABNORMAL HIGH (ref 0.0–100.0)

## 2017-10-23 MED ORDER — SODIUM CHLORIDE 0.9 % IV SOLN
INTRAVENOUS | Status: DC
  Administered 2017-10-23: 11:00:00 via INTRAVENOUS

## 2017-10-23 MED ORDER — ALBUTEROL SULFATE HFA 108 (90 BASE) MCG/ACT IN AERS
2.0000 | INHALATION_SPRAY | Freq: Once | RESPIRATORY_TRACT | Status: AC
  Administered 2017-10-23: 2 via RESPIRATORY_TRACT
  Filled 2017-10-23: qty 6.7

## 2017-10-23 MED ORDER — DEXAMETHASONE 4 MG PO TABS
10.0000 mg | ORAL_TABLET | Freq: Once | ORAL | Status: AC
  Administered 2017-10-23: 10 mg via ORAL
  Filled 2017-10-23: qty 3

## 2017-10-23 MED ORDER — IOPAMIDOL (ISOVUE-370) INJECTION 76%
INTRAVENOUS | Status: AC
  Administered 2017-10-23: 100 mL
  Filled 2017-10-23: qty 100

## 2017-10-23 MED ORDER — IPRATROPIUM-ALBUTEROL 0.5-2.5 (3) MG/3ML IN SOLN
3.0000 mL | Freq: Once | RESPIRATORY_TRACT | Status: AC
  Administered 2017-10-23: 3 mL via RESPIRATORY_TRACT
  Filled 2017-10-23: qty 3

## 2017-10-23 NOTE — ED Triage Notes (Signed)
Per EMS: pt from home with c/o increasing SOB.  Denies chest pain or palpitations.  Has a hx of COPD and CHF. Patient compliant with Lasix medication.  EMS administered .5 Atrovent and 10 Albuterol.  PTA vitals: 123/83, HR 114, Sp02 95% RA.

## 2017-10-23 NOTE — ED Notes (Signed)
Called lab regarding yellow label issue for urine.  Lab stated yellow lab did not have a bar code or two pt identifiers which is why they could not take it.  Pt informed that we need another sample when possible.

## 2017-10-23 NOTE — ED Notes (Signed)
Patient transported to CT 

## 2017-10-23 NOTE — ED Provider Notes (Signed)
I received the patient in signout from Dr. Jodelle Gross, briefly the plan is for a delta troponin.  Troponin is at his baseline and actually is downward trending.  I will discharge the patient home.  Of note the patient asked for multiple sandwiches and a food tray while he was here.  7:05 PM:  I have discussed the diagnosis/risks/treatment options with the patient and believe the pt to be eligible for discharge home to follow-up with PCP. We also discussed returning to the ED immediately if new or worsening sx occur. We discussed the sx which are most concerning (e.g., sudden worsening pain, fever, inability to tolerate by mouth) that necessitate immediate return. Medications administered to the patient during their visit and any new prescriptions provided to the patient are listed below.  Medications given during this visit Medications  0.9 %  sodium chloride infusion ( Intravenous New Bag/Given 10/23/17 1115)  dexamethasone (DECADRON) tablet 10 mg (not administered)  ipratropium-albuterol (DUONEB) 0.5-2.5 (3) MG/3ML nebulizer solution 3 mL (3 mLs Nebulization Given 10/23/17 1115)  iopamidol (ISOVUE-370) 76 % injection (100 mLs  Contrast Given 10/23/17 1432)     The patient appears reasonably screen and/or stabilized for discharge and I doubt any other medical condition or other [facility] requiring further screening, evaluation, or treatment in the ED at this time prior to discharge.     Melene Plan, DO 10/23/17 1906

## 2017-10-23 NOTE — ED Notes (Signed)
Pt aware we need a urine specimen. 

## 2017-10-23 NOTE — ED Notes (Signed)
Lab called in regards to a urine specimen that was collected at 1540 stating they could not accept it and would discard it due to only having the yellow Sun[employer] sticker.  Stated must have a pt label along with Sunquest sticker.  This RN will reorder.

## 2017-10-23 NOTE — ED Provider Notes (Signed)
[name] EMERGENCY DEPARTMENT Provider Note   CSN: [id number] Arrival date & time: 10/23/17  4696     History   Chief Complaint Chief Complaint  Patient presents with  . Shortness of Breath    HPI Mark [name] is a 42 y.o. male.  Patient brought in by EMS from home.  Main complaint was shortness of breath.  Patient denied any chest pain or palpitations.  Patient known to have history of bronchitis and known to have CHF and cardiomyopathy.  Patient stated been compliance with his Lasix.  EMS gave him Atrovent and albuterol with some improvement.  His oxygen saturations were 95% on room air.  Patient had an admission for CVA on February 7.  Patient was seen again in the emergency room for generalized weakness which includes 1 of his complaints today on February 20.  He was discharged home at that time.  Patient states she has been coughing up blood for about a week.  It is mixed with a green sputum.      Past Medical History:  Diagnosis Date  . Asthma   . Chronic systolic CHF (congestive heart failure) (HCC)   . Cigarette smoker   . History of echocardiogram    a. Echo 5/17 - EF 20-25%, severe diff HK, restrictive physiology, mild to mod MR, severe reduced RVSF, mod RVE, mild RAE, mod TR, PASP 48 mmHg  . Hx of cardiac cath    a. LHC 5/17 - normal coronary arteries. PA 45/25, mean 33, PCWP mean 18  . NICM (nonischemic cardiomyopathy) (HCC)   . Substance abuse (HCC)    cocaine, marijuana    Patient Active Problem List   Diagnosis Date Noted  . History of right MCA stroke 09/28/2017  . Stroke (HCC) 09/28/2017  . Chronic systolic CHF (congestive heart failure) (HCC) 01/11/2016  . NICM (nonischemic cardiomyopathy) (HCC) 01/11/2016  . CKD (chronic kidney disease) stage 2, GFR 60-89 ml/min 01/11/2016  . Cocaine abuse (HCC) 01/11/2016  . Chest pain, pleuritic 01/03/2016  . Tobacco abuse 01/03/2016  . Asthma 01/03/2016  . Leg swelling 01/03/2016  .  Tachycardia 01/03/2016  . Normocytic anemia 01/03/2016  . Elevated troponin 01/03/2016  . Right rib fracture 01/03/2016  . Chest pain     Past Surgical History:  Procedure Laterality Date  . CARDIAC CATHETERIZATION N/A 01/05/2016   Procedure: Right/Left Heart Cath and Coronary Angiography;  Surgeon: Lennette Bihari, MD;  Location: [facility] INVASIVE CV LAB;  Service: Cardiovascular;  Laterality: N/A;  . none         Home Medications    Prior to Admission medications   Medication Sig Start Date End Date Taking? Authorizing Provider  albuterol (PROVENTIL HFA;VENTOLIN HFA) 108 (90 Base) MCG/ACT inhaler Inhale 1-2 puffs into the lungs every 4 (four) hours as needed for wheezing or shortness of breath. 10/12/17  Yes Massie Maroon, FNP  aspirin 81 MG EC tablet Take 1 tablet (81 mg total) by mouth daily. 09/22/17  Yes Rai, Ripudeep K, MD  budesonide-formoterol (SYMBICORT) 80-4.5 MCG/ACT inhaler Inhale 2 puffs into the lungs 2 (two) times daily. 10/12/17  Yes Massie Maroon, FNP  furosemide (LASIX) 40 MG tablet Take 1 tablet (40 mg total) by mouth daily. 10/09/17 10/04/18 Yes Weaver, Kameron Blethen T, PA-C  levofloxacin (LEVAQUIN) 750 MG tablet Take 1 tablet (750 mg total) by mouth daily. 10/12/17  Yes Massie Maroon, FNP  Multiple Vitamin (MULTIVITAMIN WITH MINERALS) TABS tablet Take 1 tablet by mouth daily.  Yes [provider]  carvedilol (COREG) 3.125 MG tablet Take 1 tablet (3.125 mg total) by mouth 2 (two) times daily. 10/09/17 10/04/18  Tereso Newcomer T, PA-C  metFORMIN (GLUCOPHAGE) 500 MG tablet Take 1 tablet (500 mg total) by mouth daily with breakfast. 10/12/17   Massie Maroon, FNP  montelukast (SINGULAIR) 10 MG tablet Take 1 tablet (10 mg total) by mouth at bedtime. 10/12/17   Massie Maroon, FNP    Family History Family History  Problem Relation Age of Onset  . Cardiomyopathy Father        Reports his father has an LVAD  . Heart failure Father   . Hypertension Father   . Deep  vein thrombosis Neg Hx     Social History Social History   Tobacco Use  . Smoking status: Former Smoker    Packs/day: 0.50    Years: 20.00    Pack years: 10.00    Types: Cigarettes  . Smokeless tobacco: Never Used  Substance Use Topics  . Alcohol use: Yes    Alcohol/week: 3.0 oz    Types: 5 Shots of liquor per week    Frequency: Never    Comment: 5-6 shots of vodka daily. denies use today 10/12/2017  . Drug use: Yes    Types: Cocaine, Marijuana    Comment: not now-10/12/2017     Allergies   Hydrocodone; Lisinopril; Prednisone; and Penicillins   Review of Systems Review of Systems  Constitutional: Negative for fever.  HENT: Negative for congestion.   Eyes: Negative for redness.  Respiratory: Positive for cough and shortness of breath. Negative for wheezing.   Gastrointestinal: Negative for abdominal pain, nausea and vomiting.  Genitourinary: Negative for dysuria.  Musculoskeletal: Negative for back pain.  Skin: Negative for wound.  Neurological: Negative for headaches.  Hematological: Does not bruise/bleed easily.  Psychiatric/Behavioral: Negative for confusion.     Physical Exam Updated Vital Signs BP 100/82   Pulse (!) 108   Resp (!) 25   Ht 1.727 m (5\' 8" )   SpO2 97%   BMI 25.24 kg/m   Physical Exam  Constitutional: He is oriented to person, place, and time. He appears well-developed and well-nourished. He does not appear ill.  HENT:  Head: Normocephalic and atraumatic.  Mouth/Throat: Oropharynx is clear and moist.  Eyes: EOM are normal. Pupils are equal, round, and reactive to light.  Neck: Neck supple.  Cardiovascular: Normal rate, regular rhythm and normal heart sounds.  Pulmonary/Chest: Effort normal and breath sounds normal. No respiratory distress. He has no wheezes.  Abdominal: Soft. Bowel sounds are normal.  Musculoskeletal: Normal range of motion.  Neurological: He is alert and oriented to person, place, and time. No cranial nerve deficit.    Skin: Skin is warm.  Nursing note and vitals reviewed.    ED Treatments / Results  Labs (all labs ordered are listed, but only abnormal results are displayed) Labs Reviewed  BRAIN NATRIURETIC PEPTIDE - Abnormal; Notable for the following components:      Result Value   B Natriuretic Peptide 1,458.3 (*)    All other components within normal limits  TROPONIN I - Abnormal; Notable for the following components:   Troponin I 0.17 (*)    All other components within normal limits  COMPREHENSIVE METABOLIC PANEL - Abnormal; Notable for the following components:   CO2 21 (*)    Creatinine, Ser 1.37 (*)    Albumin 3.1 (*)    AST 52 (*)    Total Bilirubin  2.0 (*)    All other components within normal limits  CBC WITH DIFFERENTIAL/PLATELET  RAPID URINE DRUG SCREEN, HOSP PERFORMED  TROPONIN I    EKG  EKG Interpretation None       Radiology Dg Chest 2 View  Result Date: 10/23/2017 CLINICAL DATA:  Increasing shortness of breath. History of asthma-COPD, cardiomyopathy, CHF, former smoker. EXAM: CHEST  2 VIEW COMPARISON:  PA and lateral chest x-ray of October 11, 2017 FINDINGS: The lungs are mildly hyperinflated. The interstitial markings are mildly prominent though stable. There is no alveolar infiltrate or pleural effusion. The heart is mildly enlarged. The pulmonary vascularity is normal. The mediastinum is normal in width. There is no pleural effusion. The bony thorax exhibits no acute abnormality. IMPRESSION: Chronic bronchitic-reactive airway changes, stable. Stable mild cardiomegaly. There is no pneumonia nor other acute cardiopulmonary abnormality. Electronically Signed   By: David  [place] M.D.   On: 10/23/2017 09:39   Ct Angio Chest Pe W/cm &/or Wo Cm  Result Date: 10/23/2017 CLINICAL DATA:  Generalized weakness and shortness of Breath EXAM: CT ANGIOGRAPHY CHEST WITH CONTRAST TECHNIQUE: Multidetector CT imaging of the chest was performed using the standard protocol during bolus  administration of intravenous contrast. Multiplanar CT image reconstructions and MIPs were obtained to evaluate the vascular anatomy. CONTRAST:  ISOVUE-370 IOPAMIDOL (ISOVUE-370) INJECTION 76% COMPARISON:  None. FINDINGS: Cardiovascular: Thoracic aorta is visualized without significant atherosclerotic calcifications. No significant opacification of the thoracic aorta is noted however due to the timing of the contrast bolus. The pulmonary artery is well visualized within normal branching pattern. No filling defect to suggest pulmonary embolism is seen. The heart is enlarged in size. Mediastinum/Nodes: The esophagus is within normal limits. No significant hilar or mediastinal adenopathy is noted. The thoracic inlet is unremarkable. Lungs/Pleura: Diffuse emphysematous changes are identified within both lungs. No focal confluent infiltrate or sizable effusion is seen. No focal parenchymal nodules are noted. Mild lingular atelectasis is noted. Upper Abdomen: Visualized upper abdomen is within normal limits. Musculoskeletal: No acute bony abnormality is seen. Review of the MIP images confirms the above findings. IMPRESSION: No evidence of pulmonary emboli. Diffuse emphysematous changes. Electronically Signed   By: Alcide Clever M.D.   On: 10/23/2017 15:10    Procedures Procedures (including critical care time)  Medications Ordered in ED Medications  0.9 %  sodium chloride infusion ( Intravenous New Bag/Given 10/23/17 1115)  ipratropium-albuterol (DUONEB) 0.5-2.5 (3) MG/3ML nebulizer solution 3 mL (3 mLs Nebulization Given 10/23/17 1115)  iopamidol (ISOVUE-370) 76 % injection (100 mLs  Contrast Given 10/23/17 1432)     Initial Impression / Assessment and Plan / ED Course  I have reviewed the triage vital signs and the nursing notes.  Pertinent labs & imaging results that were available during my care of the patient were reviewed by me and considered in my medical decision making (see chart for details).      Patient with extensive workup for the shortness of breath.  Never heard any wheezing but did give another albuterol Atrovent nebulizers seems to help him.  Patient without coughing up any blood or coughing up any sputum here.  Patient's main complaint is been generalized weakness is not feeling well.  CT angios of chest showed no evidence of pneumonia or pulmonary M but.  So symptoms are probably consistent with a bronchitis.  Also no evidence of pulmonary edema.  His initial troponin was elevated he does have a known history of cardiomyopathy.  He usually does have elevated  troponins.  Repeat troponin ordered if markedly may require admission and consultation with cardiology.  Otherwise patient can probably be treated for bronchitis with steroids.  Follow-up with cardiology and follow-up with his regular doctor.  Final Clinical Impressions(s) / ED Diagnoses   Final diagnoses:  SOB (shortness of breath)  Bronchitis  Hemoptysis    ED Discharge Orders    None       Vanetta Mulders, MD 10/23/17 1645

## 2017-11-09 ENCOUNTER — Encounter (HOSPITAL_COMMUNITY): Payer: Self-pay | Admitting: Emergency Medicine

## 2017-11-09 ENCOUNTER — Inpatient Hospital Stay (HOSPITAL_COMMUNITY)
Admission: EM | Admit: 2017-11-09 | Discharge: 2017-11-14 | DRG: 292 | Disposition: A | Discharge status: Home Health | Payer: Self-pay | Attending: Internal Medicine | Admitting: Internal Medicine

## 2017-11-09 ENCOUNTER — Other Ambulatory Visit: Payer: Self-pay

## 2017-11-09 ENCOUNTER — Emergency Department (HOSPITAL_COMMUNITY): Payer: Self-pay

## 2017-11-09 DIAGNOSIS — I5042 Chronic combined systolic (congestive) and diastolic (congestive) heart failure: Secondary | ICD-10-CM

## 2017-11-09 DIAGNOSIS — I420 Dilated cardiomyopathy: Secondary | ICD-10-CM | POA: Diagnosis present

## 2017-11-09 DIAGNOSIS — N183 Chronic kidney disease, stage 3 (moderate): Secondary | ICD-10-CM | POA: Diagnosis present

## 2017-11-09 DIAGNOSIS — Z880 Allergy status to penicillin: Secondary | ICD-10-CM

## 2017-11-09 DIAGNOSIS — F1210 Cannabis abuse, uncomplicated: Secondary | ICD-10-CM | POA: Diagnosis present

## 2017-11-09 DIAGNOSIS — Z7951 Long term (current) use of inhaled steroids: Secondary | ICD-10-CM

## 2017-11-09 DIAGNOSIS — N182 Chronic kidney disease, stage 2 (mild): Secondary | ICD-10-CM | POA: Diagnosis present

## 2017-11-09 DIAGNOSIS — Z87891 Personal history of nicotine dependence: Secondary | ICD-10-CM

## 2017-11-09 DIAGNOSIS — D649 Anemia, unspecified: Secondary | ICD-10-CM

## 2017-11-09 DIAGNOSIS — Z91199 Patient's noncompliance with other medical treatment and regimen due to unspecified reason: Secondary | ICD-10-CM

## 2017-11-09 DIAGNOSIS — Z7984 Long term (current) use of oral hypoglycemic drugs: Secondary | ICD-10-CM

## 2017-11-09 DIAGNOSIS — R791 Abnormal coagulation profile: Secondary | ICD-10-CM | POA: Diagnosis present

## 2017-11-09 DIAGNOSIS — R0602 Shortness of breath: Secondary | ICD-10-CM

## 2017-11-09 DIAGNOSIS — N179 Acute kidney failure, unspecified: Secondary | ICD-10-CM | POA: Diagnosis present

## 2017-11-09 DIAGNOSIS — I5023 Acute on chronic systolic (congestive) heart failure: Principal | ICD-10-CM

## 2017-11-09 DIAGNOSIS — Z888 Allergy status to other drugs, medicaments and biological substances status: Secondary | ICD-10-CM

## 2017-11-09 DIAGNOSIS — Z8673 Personal history of transient ischemic attack (TIA), and cerebral infarction without residual deficits: Secondary | ICD-10-CM

## 2017-11-09 DIAGNOSIS — R778 Other specified abnormalities of plasma proteins: Secondary | ICD-10-CM

## 2017-11-09 DIAGNOSIS — R7989 Other specified abnormal findings of blood chemistry: Secondary | ICD-10-CM

## 2017-11-09 DIAGNOSIS — F1410 Cocaine abuse, uncomplicated: Secondary | ICD-10-CM | POA: Diagnosis present

## 2017-11-09 DIAGNOSIS — Z9119 Patient's noncompliance with other medical treatment and regimen: Secondary | ICD-10-CM

## 2017-11-09 DIAGNOSIS — I428 Other cardiomyopathies: Secondary | ICD-10-CM

## 2017-11-09 DIAGNOSIS — Z885 Allergy status to narcotic agent status: Secondary | ICD-10-CM

## 2017-11-09 DIAGNOSIS — Z7982 Long term (current) use of aspirin: Secondary | ICD-10-CM

## 2017-11-09 DIAGNOSIS — D631 Anemia in chronic kidney disease: Secondary | ICD-10-CM | POA: Diagnosis present

## 2017-11-09 DIAGNOSIS — I5022 Chronic systolic (congestive) heart failure: Secondary | ICD-10-CM

## 2017-11-09 DIAGNOSIS — I5021 Acute systolic (congestive) heart failure: Secondary | ICD-10-CM | POA: Diagnosis present

## 2017-11-09 DIAGNOSIS — J45909 Unspecified asthma, uncomplicated: Secondary | ICD-10-CM | POA: Diagnosis present

## 2017-11-09 DIAGNOSIS — Z79899 Other long term (current) drug therapy: Secondary | ICD-10-CM

## 2017-11-09 HISTORY — DX: Cerebral infarction, unspecified: I63.9

## 2017-11-09 HISTORY — DX: Chronic kidney disease, stage 2 (mild): N18.2

## 2017-11-09 LAB — CBC WITH DIFFERENTIAL/PLATELET
BASOS PCT: 0 %
Basophils Absolute: 0 10*3/uL (ref 0.0–0.1)
Eosinophils Absolute: 0.1 10*3/uL (ref 0.0–0.7)
Eosinophils Relative: 1 %
HEMATOCRIT: 38.3 % — AB (ref 39.0–52.0)
Hemoglobin: 12.5 g/dL — ABNORMAL LOW (ref 13.0–17.0)
LYMPHS ABS: 2.4 10*3/uL (ref 0.7–4.0)
LYMPHS PCT: 38 %
MCH: 25.4 pg — ABNORMAL LOW (ref 26.0–34.0)
MCHC: 32.6 g/dL (ref 30.0–36.0)
MCV: 77.7 fL — AB (ref 78.0–100.0)
MONO ABS: 0.4 10*3/uL (ref 0.1–1.0)
MONOS PCT: 7 %
NEUTROS ABS: 3.4 10*3/uL (ref 1.7–7.7)
NEUTROS PCT: 54 %
Platelets: 226 10*3/uL (ref 150–400)
RBC: 4.93 MIL/uL (ref 4.22–5.81)
RDW: 14.7 % (ref 11.5–15.5)
WBC: 6.3 10*3/uL (ref 4.0–10.5)

## 2017-11-09 LAB — COMPREHENSIVE METABOLIC PANEL
ALK PHOS: 100 U/L (ref 38–126)
ALT: 35 U/L (ref 17–63)
AST: 54 U/L — ABNORMAL HIGH (ref 15–41)
Albumin: 3.2 g/dL — ABNORMAL LOW (ref 3.5–5.0)
Anion gap: 13 (ref 5–15)
BILIRUBIN TOTAL: 2.6 mg/dL — AB (ref 0.3–1.2)
BUN: 20 mg/dL (ref 6–20)
CALCIUM: 9.2 mg/dL (ref 8.9–10.3)
CO2: 22 mmol/L (ref 22–32)
CREATININE: 1.43 mg/dL — AB (ref 0.61–1.24)
Chloride: 103 mmol/L (ref 101–111)
GFR calc non Af Amer: 59 mL/min — ABNORMAL LOW (ref >60)
GLUCOSE: 94 mg/dL (ref 65–99)
Potassium: 4.6 mmol/L (ref 3.5–5.1)
SODIUM: 138 mmol/L (ref 135–145)
Total Protein: 6.7 g/dL (ref 6.5–8.1)

## 2017-11-09 LAB — LIPASE, BLOOD: LIPASE: 30 U/L (ref 11–51)

## 2017-11-09 LAB — TROPONIN I: Troponin I: 0.19 ng/mL (ref <0.03)

## 2017-11-09 LAB — BRAIN NATRIURETIC PEPTIDE: B Natriuretic Peptide: 3047.5 pg/mL — ABNORMAL HIGH (ref 0.0–100.0)

## 2017-11-09 MED ORDER — ENOXAPARIN SODIUM 40 MG/0.4ML ~~LOC~~ SOLN
40.0000 mg | SUBCUTANEOUS | Status: DC
  Administered 2017-11-10 – 2017-11-13 (×4): 40 mg via SUBCUTANEOUS
  Filled 2017-11-09 (×5): qty 0.4

## 2017-11-09 MED ORDER — ACETAMINOPHEN 325 MG PO TABS
650.0000 mg | ORAL_TABLET | Freq: Four times a day (QID) | ORAL | Status: DC | PRN
  Administered 2017-11-10 – 2017-11-14 (×7): 650 mg via ORAL
  Filled 2017-11-09 (×7): qty 2

## 2017-11-09 MED ORDER — MOMETASONE FURO-FORMOTEROL FUM 100-5 MCG/ACT IN AERO
2.0000 | INHALATION_SPRAY | Freq: Two times a day (BID) | RESPIRATORY_TRACT | Status: DC
  Administered 2017-11-10 – 2017-11-14 (×9): 2 via RESPIRATORY_TRACT
  Filled 2017-11-09: qty 8.8

## 2017-11-09 MED ORDER — ONDANSETRON HCL 4 MG PO TABS: 4.0000 mg | ORAL_TABLET | Freq: Four times a day (QID) | ORAL | Status: DC | PRN

## 2017-11-09 MED ORDER — FUROSEMIDE 10 MG/ML IJ SOLN
40.0000 mg | Freq: Two times a day (BID) | INTRAMUSCULAR | Status: DC
  Administered 2017-11-10 (×2): 40 mg via INTRAVENOUS
  Filled 2017-11-09 (×2): qty 4

## 2017-11-09 MED ORDER — ACETAMINOPHEN 650 MG RE SUPP: 650.0000 mg | Freq: Four times a day (QID) | RECTAL | Status: DC | PRN

## 2017-11-09 MED ORDER — ALBUTEROL SULFATE (2.5 MG/3ML) 0.083% IN NEBU
5.0000 mg | INHALATION_SOLUTION | Freq: Once | RESPIRATORY_TRACT | Status: AC
  Administered 2017-11-09: 5 mg via RESPIRATORY_TRACT
  Filled 2017-11-09: qty 6

## 2017-11-09 MED ORDER — ACETAMINOPHEN 325 MG PO TABS
650.0000 mg | ORAL_TABLET | Freq: Once | ORAL | Status: AC
  Administered 2017-11-09: 650 mg via ORAL
  Filled 2017-11-09: qty 2

## 2017-11-09 MED ORDER — ALBUTEROL SULFATE (2.5 MG/3ML) 0.083% IN NEBU
2.5000 mg | INHALATION_SOLUTION | RESPIRATORY_TRACT | Status: DC | PRN
  Administered 2017-11-10 – 2017-11-13 (×9): 2.5 mg via RESPIRATORY_TRACT
  Filled 2017-11-09 (×12): qty 3

## 2017-11-09 MED ORDER — CARVEDILOL 3.125 MG PO TABS
3.1250 mg | ORAL_TABLET | Freq: Two times a day (BID) | ORAL | Status: DC
  Administered 2017-11-10 – 2017-11-14 (×10): 3.125 mg via ORAL
  Filled 2017-11-09 (×10): qty 1

## 2017-11-09 MED ORDER — ONDANSETRON HCL 4 MG/2ML IJ SOLN
4.0000 mg | Freq: Four times a day (QID) | INTRAMUSCULAR | Status: DC | PRN
  Administered 2017-11-10 – 2017-11-12 (×4): 4 mg via INTRAVENOUS
  Filled 2017-11-09 (×5): qty 2

## 2017-11-09 MED ORDER — ASPIRIN EC 81 MG PO TBEC
81.0000 mg | DELAYED_RELEASE_TABLET | Freq: Every day | ORAL | Status: DC
  Administered 2017-11-10 – 2017-11-14 (×6): 81 mg via ORAL
  Filled 2017-11-09 (×6): qty 1

## 2017-11-09 MED ORDER — IPRATROPIUM BROMIDE 0.02 % IN SOLN
0.5000 mg | Freq: Once | RESPIRATORY_TRACT | Status: AC
  Administered 2017-11-09: 0.5 mg via RESPIRATORY_TRACT
  Filled 2017-11-09: qty 2.5

## 2017-11-09 MED ORDER — FUROSEMIDE 10 MG/ML IJ SOLN
40.0000 mg | Freq: Once | INTRAMUSCULAR | Status: AC
  Administered 2017-11-09: 40 mg via INTRAVENOUS
  Filled 2017-11-09: qty 4

## 2017-11-09 MED ORDER — ENSURE ENLIVE PO LIQD: 237.0000 mL | Freq: Two times a day (BID) | ORAL | Status: DC

## 2017-11-09 MED ORDER — POTASSIUM CHLORIDE CRYS ER 10 MEQ PO TBCR
10.0000 meq | EXTENDED_RELEASE_TABLET | Freq: Every day | ORAL | Status: DC
  Administered 2017-11-10: 10 meq via ORAL
  Filled 2017-11-09 (×2): qty 1

## 2017-11-09 NOTE — ED Notes (Signed)
Pt placed on 2 L nasal cannula for comfort by EMS

## 2017-11-09 NOTE — ED Provider Notes (Signed)
[name] EMERGENCY DEPARTMENT Provider Note   CSN: [id number] Arrival date & time: 11/09/17  1632     History   Chief Complaint No chief complaint on file.   HPI Mark [name] is a 42 y.o. male with a PMHx of CHF (EF 10-15% on 09/28/17), asthma, tobacco use, NICM, R MCA CVA in 09/2017, peripheral edema, CKD2, polysubstance abuse, borderline DM2 (Hgb A1C 6.4% on 09/28/17), and other conditions listed below, who presents to the ED with complaints of 1 day of gradually worsening SOB, BLE swelling, and orthopnea.  Patient states that yesterday he noticed that his legs were more swollen than normal all the way up to his knees, he got in the tub and elevated them and that helped somewhat.  He also has noticed worsening shortness of breath, and 3-4 pillow orthopnea.  He has been using all of his home inhalers, states that he has 3 of them but cannot recall the names of all of them, however he states that these have not been helping.  Chart review reveals that he has Symbicort and albuterol inhalers listed on his MAR, unclear what the third inhaler is.  He reports that strong smells such as perfumes or strong food smells seem to aggravate his symptoms.  He has also had associated nausea and 3 episodes of mostly posttussive nonbloody nonbilious emesis since yesterday.  He states that most of the time he vomits when he coughs, but one time it was randomly.  He was given Zofran in route which helped.  He has also been having a cough with green sputum production with red streaks of blood mixed in for several weeks, as well as wheezing, both of which seem to be unimproved from his last visit.  Lastly he mentions that about 1-2 hours ago he started having 4/10 intermittent aching nonradiating central chest pain with no known aggravating factors, unchanged with inspiration or exertion, and with no treatments tried prior to arrival.  He reports being compliant with all of his medications including  his Lasix 40 mg twice daily, and denies having any missed doses.  He has previously been seen by Tereso Newcomer PA-C of [facility] cardiology.  His PCP is Julianne Handler FNP.  Chart review reveals that he was seen in the ED on 10/23/17 for similar complaints of cough/hemoptysis, had work up which revealed BNP 1458.3 (down from prior values), Trop 0.17 then repeated several hours later and down to 0.15 (both down from prior values as well), CXR that showed stable chronic bronchitic-reactive airway changes and stable mild cardiomegaly, and a CTA chest which was negative for PE but showed diffuse emphysematous changes; he was given 10mg  PO decadron in the ED and was discharged home with instructions to f/up with his PCP.  Of note, he was also admitted on 09/20/17-09/22/17 for CHF exacerbation.  He was admitted 2/7-10/19 for a R MCA CVA, and underwent an echo at that time which showed his EF is 10-15%.  He left AMA from that admission.     He denies diaphoresis, lightheadedness, fevers, chills, gross large volume hemoptysis, recent travel/surgery/immobilization, personal/family hx of DVT/PE, abd pain, hematemesis, melena, hematochezia, diarrhea, constipation, hematuria, dysuria, myalgias, arthralgias, claudication, numbness, tingling, new/worsening focal weakness, or any other complaints at this time. He denies being a cigarette smoker at this time, and denies cocaine use recently. +FHx of cardiac disease, states his maternal grandmother died in her sleep the day before she was scheduled to have open heart surgery, he's  not sure if she had a heart attack or not; no other known FHx of cardiac disease/CAD/MI.   The history is provided by the patient and medical records. No language interpreter was used.  Shortness of Breath  This is a new problem. The average episode lasts 1 day. The problem occurs continuously.The current episode started yesterday. The problem has been gradually worsening. Associated symptoms include  cough, sputum production, hemoptysis, wheezing, orthopnea, chest pain, vomiting and leg swelling. Pertinent negatives include no fever, no abdominal pain and no claudication. The problem's precipitants include strong odors. He has tried beta-agonist inhalers for the symptoms. The treatment provided no relief. He has had prior hospitalizations. He has had prior ED visits. Associated medical issues include asthma and heart failure. Associated medical issues do not include PE, DVT or recent surgery.    Past Medical History:  Diagnosis Date  . Asthma   . Chronic systolic CHF (congestive heart failure) (HCC)   . Cigarette smoker   . History of echocardiogram    a. Echo 5/17 - EF 20-25%, severe diff HK, restrictive physiology, mild to mod MR, severe reduced RVSF, mod RVE, mild RAE, mod TR, PASP 48 mmHg  . Hx of cardiac cath    a. LHC 5/17 - normal coronary arteries. PA 45/25, mean 33, PCWP mean 18  . NICM (nonischemic cardiomyopathy) (HCC)   . Substance abuse (HCC)    cocaine, marijuana    Patient Active Problem List   Diagnosis Date Noted  . History of right MCA stroke 09/28/2017  . Stroke (HCC) 09/28/2017  . Chronic systolic CHF (congestive heart failure) (HCC) 01/11/2016  . NICM (nonischemic cardiomyopathy) (HCC) 01/11/2016  . CKD (chronic kidney disease) stage 2, GFR 60-89 ml/min 01/11/2016  . Cocaine abuse (HCC) 01/11/2016  . Chest pain, pleuritic 01/03/2016  . Tobacco abuse 01/03/2016  . Asthma 01/03/2016  . Leg swelling 01/03/2016  . Tachycardia 01/03/2016  . Normocytic anemia 01/03/2016  . Elevated troponin 01/03/2016  . Right rib fracture 01/03/2016  . Chest pain     Past Surgical History:  Procedure Laterality Date  . CARDIAC CATHETERIZATION N/A 01/05/2016   Procedure: Right/Left Heart Cath and Coronary Angiography;  Surgeon: Lennette Bihari, MD;  Location: [facility] INVASIVE CV LAB;  Service: Cardiovascular;  Laterality: N/A;  . none         Home Medications    Prior to  Admission medications   Medication Sig Start Date End Date Taking? Authorizing Provider  albuterol (PROVENTIL HFA;VENTOLIN HFA) 108 (90 Base) MCG/ACT inhaler Inhale 1-2 puffs into the lungs every 4 (four) hours as needed for wheezing or shortness of breath. 10/12/17   Massie Maroon, FNP  aspirin 81 MG EC tablet Take 1 tablet (81 mg total) by mouth daily. 09/22/17   Rai, Delene Ruffini, MD  budesonide-formoterol (SYMBICORT) 80-4.5 MCG/ACT inhaler Inhale 2 puffs into the lungs 2 (two) times daily. 10/12/17   Massie Maroon, FNP  carvedilol (COREG) 3.125 MG tablet Take 1 tablet (3.125 mg total) by mouth 2 (two) times daily. 10/09/17 10/04/18  Tereso Newcomer T, PA-C  furosemide (LASIX) 40 MG tablet Take 1 tablet (40 mg total) by mouth daily. 10/09/17 10/04/18  Tereso Newcomer T, PA-C  levofloxacin (LEVAQUIN) 750 MG tablet Take 1 tablet (750 mg total) by mouth daily. 10/12/17   Massie Maroon, FNP  metFORMIN (GLUCOPHAGE) 500 MG tablet Take 1 tablet (500 mg total) by mouth daily with breakfast. 10/12/17   Massie Maroon, FNP  montelukast (SINGULAIR) 10  MG tablet Take 1 tablet (10 mg total) by mouth at bedtime. 10/12/17   Massie Maroon, FNP  Multiple Vitamin (MULTIVITAMIN WITH MINERALS) TABS tablet Take 1 tablet by mouth daily.    [provider]    Family History Family History  Problem Relation Age of Onset  . Cardiomyopathy Father        Reports his father has an LVAD  . Heart failure Father   . Hypertension Father   . Deep vein thrombosis Neg Hx     Social History Social History   Tobacco Use  . Smoking status: Former Smoker    Packs/day: 0.50    Years: 20.00    Pack years: 10.00    Types: Cigarettes  . Smokeless tobacco: Never Used  Substance Use Topics  . Alcohol use: Yes    Alcohol/week: 3.0 oz    Types: 5 Shots of liquor per week    Frequency: Never    Comment: 5-6 shots of vodka daily. denies use today 10/12/2017  . Drug use: Yes    Types: Cocaine, Marijuana     Comment: not now-10/12/2017     Allergies   Hydrocodone; Lisinopril; Prednisone; and Penicillins   Review of Systems Review of Systems  Constitutional: Negative for chills, diaphoresis and fever.  Respiratory: Positive for cough, hemoptysis, sputum production, shortness of breath and wheezing.        +orthopnea  Cardiovascular: Positive for chest pain, orthopnea and leg swelling. Negative for claudication.  Gastrointestinal: Positive for nausea and vomiting. Negative for abdominal pain, blood in stool, constipation and diarrhea.  Genitourinary: Negative for dysuria and hematuria.  Musculoskeletal: Negative for arthralgias and myalgias.  Skin: Negative for color change.  Allergic/Immunologic: Positive for immunocompromised state (borderline DM2).  Neurological: Negative for weakness, light-headedness and numbness.  Psychiatric/Behavioral: Negative for confusion.   All other systems reviewed and are negative for acute change except as noted in the HPI.    Physical Exam Updated Vital Signs Pulse (!) 108    BP 104/80   Temp 97.7 F (36.5 C) (Oral)   Resp (!) 26   Wt 75.3 kg (166 lb)   SpO2 96%   BMI 25.24 kg/m    Physical Exam  Constitutional: He is oriented to person, place, and time. Vital signs are normal. He appears well-developed and well-nourished.  Non-toxic appearance. No distress.  Afebrile, nontoxic, NAD  HENT:  Head: Normocephalic and atraumatic.  Mouth/Throat: Oropharynx is clear and moist and mucous membranes are normal.  Eyes: Conjunctivae and EOM are normal. Right eye exhibits no discharge. Left eye exhibits no discharge.  Neck: Normal range of motion. Neck supple.  Cardiovascular: Regular rhythm and intact distal pulses. Tachycardia present. Exam reveals no gallop and no friction rub.  Murmur (possible) heard.  Systolic murmur is present. Mildly tachycardic in the low 100s which is similar to his prior visits; reg rhythm, somewhat distant heart sounds but  still with nl s1/s2, possible ?systolic murmur heard at apex although hard to tell, no rubs/gallops appreciated, distal pulses intact, 2+ b/l pedal edema up to mid-calf  Pulmonary/Chest: Tachypnea noted. No respiratory distress. He has decreased breath sounds. He has no wheezes. He has no rhonchi. He has no rales. He exhibits no tenderness, no crepitus, no deformity and no retraction.  Poor air movement diffusely, mildly tachypneic but no acute respiratory distress noted, no w/r/r appreciated although hard to tell due to poor air movement throughout lung fields, no hypoxia, slight increased WOB speaking in slightly fragmented  sentences, SpO2 96% on RA  Chest wall nonTTP without crepitus, deformities, or retractions   Abdominal: Soft. Normal appearance and bowel sounds are normal. He exhibits no distension. There is no tenderness. There is no rigidity, no rebound, no guarding, no CVA tenderness, no tenderness at McBurney's point and negative Murphy's sign.  Soft, NTND, +BS throughout, no r/g/r, neg murphy's, neg mcburney's, no CVA TTP   Musculoskeletal: Normal range of motion.  MAE x4 Strength and sensation grossly intact in all extremities Distal pulses intact 2+ b/l pedal edema, neg homan's bilaterally   Neurological: He is alert and oriented to person, place, and time. He has normal strength. No sensory deficit.  Skin: Skin is warm, dry and intact. No rash noted.  Psychiatric: He has a normal mood and affect.  Nursing note and vitals reviewed.   Wt Readings from Last 3 Encounters:  11/09/17 74.5 kg (164 lb 4.8 oz)  10/12/17 75.3 kg (166 lb)  10/09/17 76.6 kg (168 lb 12.8 oz)     ED Treatments / Results  Labs (all labs ordered are listed, but only abnormal results are displayed) Labs Reviewed  CBC WITH DIFFERENTIAL/PLATELET - Abnormal; Notable for the following components:      Result Value   Hemoglobin 12.5 (*)    HCT 38.3 (*)    MCV 77.7 (*)    MCH 25.4 (*)    All other  components within normal limits  BRAIN NATRIURETIC PEPTIDE - Abnormal; Notable for the following components:   B Natriuretic Peptide 3,047.5 (*)    All other components within normal limits  COMPREHENSIVE METABOLIC PANEL - Abnormal; Notable for the following components:   Creatinine, Ser 1.43 (*)    Albumin 3.2 (*)    AST 54 (*)    Total Bilirubin 2.6 (*)    GFR calc non Af Amer 59 (*)    All other components within normal limits  TROPONIN I - Abnormal; Notable for the following components:   Troponin I 0.19 (*)    All other components within normal limits  LIPASE, BLOOD    EKG  EKG Interpretation  Date/Time:  Thursday November 09 2017 16:48:12 EDT Ventricular Rate:  109 PR Interval:    QRS Duration: 82 QT Interval:  350 QTC Calculation: 472 R Axis:   -152 Text Interpretation:  Sinus tachycardia Probable lateral infarct, age indeterminate low voltage No significant change since last tracing Confirmed by Jerelyn Scott [phone]) on 11/09/2017 4:51:31 PM        Radiology Dg Chest 2 View  Result Date: 11/09/2017 CLINICAL DATA:  Shortness of breath, cough, and hemoptysis. EXAM: CHEST - 2 VIEW COMPARISON:  Chest x-ray dated October 23, 2017. FINDINGS: Stable mild cardiomegaly. Normal pulmonary vascularity. Chronic bronchitic changes again noted. The lungs remain hyperinflated. No focal consolidation, pleural effusion, or pneumothorax. No acute osseous abnormality. IMPRESSION: No active cardiopulmonary disease. Electronically Signed   By: Obie Dredge M.D.   On: 11/09/2017 19:26     CTA chest 10/23/17: Study Result  CLINICAL DATA:  Generalized weakness and shortness of Breath  EXAM: CT ANGIOGRAPHY CHEST WITH CONTRAST  TECHNIQUE: Multidetector CT imaging of the chest was performed using the standard protocol during bolus administration of intravenous contrast. Multiplanar CT image reconstructions and MIPs were obtained to evaluate the vascular anatomy.  CONTRAST:   ISOVUE-370 IOPAMIDOL (ISOVUE-370) INJECTION 76%  COMPARISON:  None.  FINDINGS: Cardiovascular: Thoracic aorta is visualized without significant atherosclerotic calcifications. No significant opacification of the thoracic aorta is noted however  due to the timing of the contrast bolus. The pulmonary artery is well visualized within normal branching pattern. No filling defect to suggest pulmonary embolism is seen. The heart is enlarged in size.  Mediastinum/Nodes: The esophagus is within normal limits. No significant hilar or mediastinal adenopathy is noted. The thoracic inlet is unremarkable.  Lungs/Pleura: Diffuse emphysematous changes are identified within both lungs. No focal confluent infiltrate or sizable effusion is seen. No focal parenchymal nodules are noted. Mild lingular atelectasis is noted.  Upper Abdomen: Visualized upper abdomen is within normal limits.  Musculoskeletal: No acute bony abnormality is seen.  Review of the MIP images confirms the above findings.  IMPRESSION: No evidence of pulmonary emboli.  Diffuse emphysematous changes.   Electronically Signed   By: Alcide Clever M.D.   On: 10/23/2017 15:10    Echo 09/28/17: Study Conclusions - Left ventricle: The cavity size was normal. Systolic function was   normal. The estimated ejection fraction was in the range of 10%   to 15%. Severe diffuse hypokinesis. - Mitral valve: There was moderate to severe regurgitation. - Right ventricle: The cavity size was mildly dilated. Wall   thickness was normal. Systolic function was mildly reduced. - Right atrium: The atrium was mildly to moderately dilated. - Pulmonary arteries: Systolic pressure was moderately increased.   PA peak pressure: 56 mm Hg (S).   01/05/16 Heart Cath: Conclusion  Dilated congestive nonischemic cardiomyopathy with echo Doppler assessment of EF at 20 - 25%.  Elevation of right heart pressures with moderate pulmonary  hypertension.  Normal coronary arteries.  Recommendation: Aggressive medical therapy with lifestyle adjustment for his dilated cardIomyopathy.  If LV function does not improve, consider evaluation for prophylactic ICD implantation.    Procedures Procedures (including critical care time)  Medications Ordered in ED Medications  furosemide (LASIX) injection 40 mg (40 mg Intravenous Given 11/09/17 1929)  albuterol (PROVENTIL) (2.5 MG/3ML) 0.083% nebulizer solution 5 mg (5 mg Nebulization Given 11/09/17 1929)  ipratropium (ATROVENT) nebulizer solution 0.5 mg (0.5 mg Nebulization Given 11/09/17 1929)     Initial Impression / Assessment and Plan / ED Course  I have reviewed the triage vital signs and the nursing notes.  Pertinent labs & imaging results that were available during my care of the patient were reviewed by me and considered in my medical decision making (see chart for details).     42 y.o. male here with 1 day of worsening BLE swelling and SOB with worsening orthopnea and posttussive emesis x3, has also had a cough with green sputum production mixed with tinges of blood for a few weeks, as well as wheezing. Was seen in the ED on 10/23/17 and had work up that revealed BNP 1458.3 (down from prior) and trop 0.17 then 0.15 (down from prior), CTA chest negative for PE, CXR with stable bronchitic findings; he was discharged after being given 10mg  PO decadron and neb treatments, advised to f/up with PCP. Had an admission 1/30-09/22/17 for CHF exacerbation, and then a few days later had a R MCA CVA (Echo at that time showed EF 10-15%). He states compliance with his meds including his lasix 40mg  BID. On exam today, mildly tachypneic and speaking in slightly fragmented sentences however not in acute respiratory distress, SpO2 96% on RA, diffusely diminished lung sounds throughout, no definite wheezing/rales appreciated however poor airflow makes it hard to tell, 2+ BLE edema, mildly tachycardic in  the low 100s which appears to be his baseline, no chest wall tenderness, ?systolic murmur  heard at apex although hard to tell, neg homan's sign bilaterally, no abdominal tenderness. I suspect CHF exacerbation although asthma/COPD exacerbation could also be possible though less likely considering his fluid overload appearance. Will get EKG, labs, pt weight, CXR, and give lasix and nebs. Will hold off on steroids for now unless labs/imaging indicate the need for this. Doubt PE, doubt need for D-dimer testing or CTA at this time. Will reassess shortly, but anticipate possible need for admission. Discussed case with my attending Dr. Phineas Real who agrees with plan.   7:30 PM CBC w/diff with mild stable chronic anemia. CMP with stable kidney function, mildly elevated Tbili 2.6 which is similar to prior visits. Lipase WNL. Trop elevated at 0.19 which is similar to his prior elevations, likely from demand ischemia from the CHF exacerbation. BNP 3047.5. EKG without acute ischemic findings. CXR with stable cardiomegaly and otherwise no acute findings.  Pt weight 164lb 4.8oz which is actually down from his prior weights however it's unclear if those prior weights were actual or stated weights. Overall, labs correlate with CHF exacerbation, which makes the most sense. Will proceed with admission. Of note, pt just now receiving nebs/meds.   7:57 PM Dr. Toniann Fail of [facility] returning page and will admit. Holding orders to be placed by admitting team. Please see their notes for further documentation of care. I appreciate their help with this pleasant pt's care. Pt stable at time of admission.    Final Clinical Impressions(s) / ED Diagnoses   Final diagnoses:  Acute on chronic systolic CHF (congestive heart failure) (HCC)  Acute on chronic systolic congestive heart failure (HCC)  SOB (shortness of breath)  Chronic anemia  Elevated troponin I level    ED Discharge Orders    [address], [place], [place] 11/09/17  1958    Phillis Haggis, MD 11/09/17 [phone]

## 2017-11-09 NOTE — H&P (Addendum)
History and Physical    Mark [name]:[id number] DOB: 1976-07-26 DOA: 11/09/2017  PCP: Massie Maroon, FNP  Patient coming from: Home.  Chief Complaint: Shortness of breath.  HPI: Mark [name] is a 42 y.o. male with history of nonischemic cardiomyopathy, asthma recent stroke in February 1 week 2019 last month presents to the ER because of increasing shortness of breath.  Patient's shortness of breath started this morning.  Patient states he felt difficult to walk from his bathroom to the room.  He also had some chest tightness.  Chest tightness only on exertion.  Has some cough with clear sputum.  Denies any fever or chills.  Has been noticing increasing lower extremity edema.  Patient states he has been compliant with his Lasix.    ED Course: In the ER patient was found to have lower extremity edema.  Short of breath but not in distress.  Chest x-ray was clear.  BNP is markedly elevated at 3000.  Troponin is mildly elevated EKG shows normal sinus rhythm with nonspecific ST-T changes.  Patient was given Lasix 40 mg IV and admitted for further management.   Review of Systems: As per HPI, rest all negative.   Past Medical History:  Diagnosis Date  . Asthma   . Chronic systolic CHF (congestive heart failure) (HCC)   . Cigarette smoker   . History of echocardiogram    a. Echo 5/17 - EF 20-25%, severe diff HK, restrictive physiology, mild to mod MR, severe reduced RVSF, mod RVE, mild RAE, mod TR, PASP 48 mmHg  . Hx of cardiac cath    a. LHC 5/17 - normal coronary arteries. PA 45/25, mean 33, PCWP mean 18  . NICM (nonischemic cardiomyopathy) (HCC)   . Substance abuse (HCC)    cocaine, marijuana    Past Surgical History:  Procedure Laterality Date  . CARDIAC CATHETERIZATION N/A 01/05/2016   Procedure: Right/Left Heart Cath and Coronary Angiography;  Surgeon: Lennette Bihari, MD;  Location: [facility] INVASIVE CV LAB;  Service: Cardiovascular;  Laterality: N/A;  . none       reports  that he has quit smoking. His smoking use included cigarettes. He has a 10.00 pack-year smoking history. He has never used smokeless tobacco. He reports that he drinks about 3.0 oz of alcohol per week. He reports that he has current or past drug history. Drugs: Cocaine and Marijuana.  Allergies  Allergen Reactions  . Hydrocodone Hives  . Lisinopril Swelling and Other (See Comments)    Facial swelling/angioedema  . Prednisone Shortness Of Breath, Nausea Only, Swelling and Other (See Comments)    Also made chest feel tight and genital area, legs, and face became swollen badly  . Penicillins Hives and Swelling     Has patient had a PCN reaction causing immediate rash, facial/tongue/throat swelling, SOB or lightheadedness with hypotension: Yes Has patient had a PCN reaction causing severe rash involving mucus membranes or skin necrosis: No Has patient had a PCN reaction that required hospitalization: No Has patient had a PCN reaction occurring within the last 10 years: No If all of the above answers are "NO", then may proceed with Cephalosporin use.     Family History  Problem Relation Age of Onset  . Cardiomyopathy Father        Reports his father has an LVAD  . Heart failure Father   . Hypertension Father   . Deep vein thrombosis Neg Hx     Prior to Admission medications  Medication Sig Start Date End Date Taking? Authorizing Provider  albuterol (PROVENTIL HFA;VENTOLIN HFA) 108 (90 Base) MCG/ACT inhaler Inhale 1-2 puffs into the lungs every 4 (four) hours as needed for wheezing or shortness of breath. 10/12/17  Yes Massie Maroon, FNP  aspirin 81 MG EC tablet Take 1 tablet (81 mg total) by mouth daily. 09/22/17  Yes Rai, Ripudeep K, MD  budesonide-formoterol (SYMBICORT) 80-4.5 MCG/ACT inhaler Inhale 2 puffs into the lungs 2 (two) times daily. 10/12/17  Yes Massie Maroon, FNP  carvedilol (COREG) 3.125 MG tablet Take 1 tablet (3.125 mg total) by mouth 2 (two) times daily. Patient  taking differently: Take 3.125 mg by mouth daily.  10/09/17 10/04/18 Yes Weaver, Scott T, PA-C  furosemide (LASIX) 40 MG tablet Take 1 tablet (40 mg total) by mouth daily. 10/09/17 10/04/18 Yes Weaver, Scott T, PA-C  Multiple Vitamin (MULTIVITAMIN WITH MINERALS) TABS tablet Take 1 tablet by mouth daily.   Yes [provider]  levofloxacin (LEVAQUIN) 750 MG tablet Take 1 tablet (750 mg total) by mouth daily. Patient not taking: Reported on 11/09/2017 10/12/17   Massie Maroon, FNP  metFORMIN (GLUCOPHAGE) 500 MG tablet Take 1 tablet (500 mg total) by mouth daily with breakfast. Patient not taking: Reported on 11/09/2017 10/12/17   Massie Maroon, FNP  montelukast (SINGULAIR) 10 MG tablet Take 1 tablet (10 mg total) by mouth at bedtime. Patient not taking: Reported on 11/09/2017 10/12/17   Massie Maroon, FNP    Physical Exam: Vitals:   11/09/17 2000 11/09/17 2015 11/09/17 2045 11/09/17 2115  BP: 106/71 (!) 118/98 109/85 105/80  Pulse: (!) 108 (!) 107 (!) 110 (!) 55  Resp: (!) 34 (!) 32 18 (!) 30  Temp:      TempSrc:      SpO2: 100% 100% 97% 93%  Weight:          Constitutional: Moderately built and nourished. Vitals:   11/09/17 2000 11/09/17 2015 11/09/17 2045 11/09/17 2115  BP: 106/71 (!) 118/98 109/85 105/80  Pulse: (!) 108 (!) 107 (!) 110 (!) 55  Resp: (!) 34 (!) 32 18 (!) 30  Temp:      TempSrc:      SpO2: 100% 100% 97% 93%  Weight:       Eyes: Anicteric no pallor. ENMT: No discharge from the ears eyes nose or mouth. Neck: No mass felt.  JVD elevated. Respiratory: No rhonchi or crepitations. Cardiovascular: S1-S2 heard no murmurs appreciated. Abdomen: Soft nontender bowel sounds present. Musculoskeletal: Bilateral lower extremity edema. Skin: No rash. Neurologic: Alert awake oriented to time place and person.  Moves all extremities. Psychiatric: Appears normal.  Normal affect.   Labs on Admission: I have personally reviewed following labs and imaging  studies  CBC: Recent Labs  Lab 11/09/17 1812  WBC 6.3  NEUTROABS 3.4  HGB 12.5*  HCT 38.3*  MCV 77.7*  PLT 226   Basic Metabolic Panel: Recent Labs  Lab 11/09/17 1812  NA 138  K 4.6  CL 103  CO2 22  GLUCOSE 94  BUN 20  CREATININE 1.43*  CALCIUM 9.2   GFR: Estimated Creatinine Clearance: 64.4 mL/min (A) (by C-G formula based on SCr of 1.43 mg/dL (H)). Liver Function Tests: Recent Labs  Lab 11/09/17 1812  AST 54*  ALT 35  ALKPHOS 100  BILITOT 2.6*  PROT 6.7  ALBUMIN 3.2*   Recent Labs  Lab 11/09/17 1812  LIPASE 30   No results for input(s): AMMONIA in the  last 168 hours. Coagulation Profile: No results for input(s): INR, PROTIME in the last 168 hours. Cardiac Enzymes: Recent Labs  Lab 11/09/17 1812  TROPONINI 0.19*   BNP (last 3 results) No results for input(s): PROBNP in the last 8760 hours. HbA1C: No results for input(s): HGBA1C in the last 72 hours. CBG: No results for input(s): GLUCAP in the last 168 hours. Lipid Profile: No results for input(s): CHOL, HDL, LDLCALC, TRIG, CHOLHDL, LDLDIRECT in the last 72 hours. Thyroid Function Tests: No results for input(s): TSH, T4TOTAL, FREET4, T3FREE, THYROIDAB in the last 72 hours. Anemia Panel: No results for input(s): VITAMINB12, FOLATE, FERRITIN, TIBC, IRON, RETICCTPCT in the last 72 hours. Urine analysis:    Component Value Date/Time   COLORURINE AMBER (A) 10/11/2017 1920   APPEARANCEUR CLEAR 10/11/2017 1920   LABSPEC 1.031 (H) 10/11/2017 1920   PHURINE 5.0 10/11/2017 1920   GLUCOSEU NEGATIVE 10/11/2017 1920   HGBUR NEGATIVE 10/11/2017 1920   BILIRUBINUR NEGATIVE 10/11/2017 1920   KETONESUR 5 (A) 10/11/2017 1920   PROTEINUR 100 (A) 10/11/2017 1920   NITRITE NEGATIVE 10/11/2017 1920   LEUKOCYTESUR NEGATIVE 10/11/2017 1920   Sepsis Labs: @LABRCNTIP (procalcitonin:4,lacticidven:4) )No results found for this or any previous visit (from the past 240 hour(s)).   Radiological Exams on  Admission: Dg Chest 2 View  Result Date: 11/09/2017 CLINICAL DATA:  Shortness of breath, cough, and hemoptysis. EXAM: CHEST - 2 VIEW COMPARISON:  Chest x-ray dated October 23, 2017. FINDINGS: Stable mild cardiomegaly. Normal pulmonary vascularity. Chronic bronchitic changes again noted. The lungs remain hyperinflated. No focal consolidation, pleural effusion, or pneumothorax. No acute osseous abnormality. IMPRESSION: No active cardiopulmonary disease. Electronically Signed   By: Obie Dredge M.D.   On: 11/09/2017 19:26    EKG: Independently reviewed.  Normal sinus rhythm with nonspecific changes.  Assessment/Plan Principal Problem:   Acute systolic CHF (congestive heart failure) (HCC) Active Problems:   Asthma   NICM (nonischemic cardiomyopathy) (HCC)   CKD (chronic kidney disease) stage 2, GFR 60-89 ml/min    1. Acute systolic heart failure last EF measured in February 2019 last month was 10-15% -has had unremarkable cardiac cath in 2017.  Patient has been placed on Lasix 40 mg IV every 12 along with patient's BiDil and Coreg.  Not on ACE inhibitor due to renal failure and allergies.  Closely follow intake output daily weights and metabolic panel. 2. Recent stroke -on aspirin and statins.  Stroke was felt secondary to cocaine and also given the history of cardiomyopathy there was plans to start on anticoagulation if patient remains compliant. 3. Normocytic normochromic anemia -check anemia panel follow CBC. 4. History of asthma presently not wheezing. 5. Chronic kidney disease stage III -follow metabolic panel. 6. History of polysubstance abuse -denies having taken any cocaine alcohol or tobacco since his last stroke last month.  Last hemoglobin A1c was around 6.4.  Patient's medication list suggest patient taking metformin not sure if patient is taking it.  Closely follow metabolic panel.   DVT prophylaxis: Lovenox. Code Status: Full code. Family Communication: Discussed with  patient. Disposition Plan: Home. Consults called: None. Admission status: Inpatient.   Eduard Clos MD [facility] Pager [phone].  If 7PM-7AM, please contact night-coverage [email or web address] Password [facility]  11/09/2017, 9:59 PM

## 2017-11-09 NOTE — ED Notes (Signed)
Difficult IV start/ blood draw x 2

## 2017-11-09 NOTE — ED Notes (Signed)
Food given to pt per Dr. Kirtland Bouchard

## 2017-11-09 NOTE — ED Notes (Signed)
Pt's pulse ox not registering properly, given instant hot pack due to cold hands to help pulse ox register. Now 97% on room air with good wave form, pt requesting oxygen to help with breathing, placed on 2L via nasal cannula, PA notified.

## 2017-11-09 NOTE — ED Triage Notes (Signed)
Pt here via EMS for leg swelling since last night, stated he elevated his legs and, stated he currently has SOB with nausea and vomiting. States he is coughing up green sputum that is blood tinged. Denies pain. 107/79, 108 HR, resp 22, 100% room air, CBG 104. 22 LAC in place. ODT Zofran given.

## 2017-11-10 ENCOUNTER — Ambulatory Visit: Payer: Self-pay | Admitting: Family Medicine

## 2017-11-10 ENCOUNTER — Inpatient Hospital Stay (HOSPITAL_COMMUNITY): Payer: Self-pay

## 2017-11-10 DIAGNOSIS — D649 Anemia, unspecified: Secondary | ICD-10-CM

## 2017-11-10 DIAGNOSIS — I428 Other cardiomyopathies: Secondary | ICD-10-CM

## 2017-11-10 DIAGNOSIS — I5023 Acute on chronic systolic (congestive) heart failure: Principal | ICD-10-CM

## 2017-11-10 DIAGNOSIS — N182 Chronic kidney disease, stage 2 (mild): Secondary | ICD-10-CM

## 2017-11-10 DIAGNOSIS — R0602 Shortness of breath: Secondary | ICD-10-CM

## 2017-11-10 DIAGNOSIS — J45909 Unspecified asthma, uncomplicated: Secondary | ICD-10-CM

## 2017-11-10 LAB — BASIC METABOLIC PANEL
Anion gap: 15 (ref 5–15)
BUN: 26 mg/dL — ABNORMAL HIGH (ref 6–20)
CALCIUM: 9.4 mg/dL (ref 8.9–10.3)
CHLORIDE: 101 mmol/L (ref 101–111)
CO2: 22 mmol/L (ref 22–32)
CREATININE: 1.91 mg/dL — AB (ref 0.61–1.24)
GFR calc non Af Amer: 41 mL/min — ABNORMAL LOW (ref >60)
GFR, EST AFRICAN AMERICAN: 48 mL/min — AB (ref >60)
GLUCOSE: 77 mg/dL (ref 65–99)
Potassium: 5.9 mmol/L — ABNORMAL HIGH (ref 3.5–5.1)
Sodium: 138 mmol/L (ref 135–145)

## 2017-11-10 LAB — CBC
HCT: 36.9 % — ABNORMAL LOW (ref 39.0–52.0)
HCT: 41.8 % (ref 39.0–52.0)
HEMOGLOBIN: 13.2 g/dL (ref 13.0–17.0)
Hemoglobin: 11.8 g/dL — ABNORMAL LOW (ref 13.0–17.0)
MCH: 24.9 pg — AB (ref 26.0–34.0)
MCH: 25.2 pg — AB (ref 26.0–34.0)
MCHC: 31.6 g/dL (ref 30.0–36.0)
MCHC: 32 g/dL (ref 30.0–36.0)
MCV: 78.8 fL (ref 78.0–100.0)
MCV: 78.9 fL (ref 78.0–100.0)
Platelets: 221 10*3/uL (ref 150–400)
Platelets: 226 10*3/uL (ref 150–400)
RBC: 4.68 MIL/uL (ref 4.22–5.81)
RBC: 5.3 MIL/uL (ref 4.22–5.81)
RDW: 15.2 % (ref 11.5–15.5)
RDW: 15.1 % (ref 11.5–15.5)
WBC: 6.7 10*3/uL (ref 4.0–10.5)
WBC: 7.4 10*3/uL (ref 4.0–10.5)

## 2017-11-10 LAB — HEPATIC FUNCTION PANEL
ALK PHOS: 116 U/L (ref 38–126)
ALT: 54 U/L (ref 17–63)
AST: 106 U/L — ABNORMAL HIGH (ref 15–41)
Albumin: 3.2 g/dL — ABNORMAL LOW (ref 3.5–5.0)
BILIRUBIN DIRECT: 1.3 mg/dL — AB (ref 0.1–0.5)
BILIRUBIN INDIRECT: 1.9 mg/dL — AB (ref 0.3–0.9)
BILIRUBIN TOTAL: 3.2 mg/dL — AB (ref 0.3–1.2)
Total Protein: 7.2 g/dL (ref 6.5–8.1)

## 2017-11-10 LAB — CREATININE, SERUM
Creatinine, Ser: 1.67 mg/dL — ABNORMAL HIGH (ref 0.61–1.24)
GFR calc Af Amer: 56 mL/min — ABNORMAL LOW (ref >60)
GFR calc non Af Amer: 49 mL/min — ABNORMAL LOW (ref >60)

## 2017-11-10 LAB — IRON AND TIBC
Iron: 180 ug/dL (ref 45–182)
SATURATION RATIOS: 48 % — AB (ref 17.9–39.5)
TIBC: 372 ug/dL (ref 250–450)
UIBC: 192 ug/dL

## 2017-11-10 LAB — RETICULOCYTES
RBC.: 5.3 MIL/uL (ref 4.22–5.81)
RETIC COUNT ABSOLUTE: 74.2 10*3/uL (ref 19.0–186.0)
RETIC CT PCT: 1.4 % (ref 0.4–3.1)

## 2017-11-10 LAB — TROPONIN I
TROPONIN I: 0.12 ng/mL — AB (ref <0.03)
Troponin I: 0.15 ng/mL (ref <0.03)
Troponin I: 0.09 ng/mL (ref <0.03)

## 2017-11-10 LAB — RAPID URINE DRUG SCREEN, HOSP PERFORMED
Amphetamines: NOT DETECTED
BARBITURATES: NOT DETECTED
Benzodiazepines: NOT DETECTED
Cocaine: NOT DETECTED
Opiates: NOT DETECTED
Tetrahydrocannabinol: NOT DETECTED

## 2017-11-10 LAB — D-DIMER, QUANTITATIVE: D-Dimer, Quant: 5.11 ug/mL-FEU — ABNORMAL HIGH (ref 0.00–0.50)

## 2017-11-10 LAB — FOLATE: Folate: 17.7 ng/mL (ref >5.9)

## 2017-11-10 LAB — VITAMIN B12: Vitamin B-12: 961 pg/mL — ABNORMAL HIGH (ref 180–914)

## 2017-11-10 LAB — FERRITIN: FERRITIN: 150 ng/mL (ref 24–336)

## 2017-11-10 LAB — MAGNESIUM: MAGNESIUM: 1.8 mg/dL (ref 1.7–2.4)

## 2017-11-10 LAB — TSH: TSH: 0.289 u[IU]/mL — AB (ref 0.350–4.500)

## 2017-11-10 MED ORDER — ATORVASTATIN CALCIUM 80 MG PO TABS
80.0000 mg | ORAL_TABLET | Freq: Every day | ORAL | Status: DC
  Administered 2017-11-10: 80 mg via ORAL
  Filled 2017-11-10 (×2): qty 1

## 2017-11-10 MED ORDER — TECHNETIUM TC 99M DIETHYLENETRIAME-PENTAACETIC ACID
32.7000 | Freq: Once | INTRAVENOUS | Status: AC | PRN
  Administered 2017-11-10: 32.7 via RESPIRATORY_TRACT

## 2017-11-10 MED ORDER — DEXTROMETHORPHAN POLISTIREX ER 30 MG/5ML PO SUER
30.0000 mg | Freq: Once | ORAL | Status: AC
  Administered 2017-11-10: 30 mg via ORAL
  Filled 2017-11-10: qty 5

## 2017-11-10 MED ORDER — TECHNETIUM TO 99M ALBUMIN AGGREGATED
4.3000 | Freq: Once | INTRAVENOUS | Status: AC | PRN
  Administered 2017-11-10: 4.3 via INTRAVENOUS

## 2017-11-10 MED ORDER — FUROSEMIDE 40 MG PO TABS
40.0000 mg | ORAL_TABLET | Freq: Every day | ORAL | Status: DC
  Administered 2017-11-11 – 2017-11-12 (×2): 40 mg via ORAL
  Filled 2017-11-10 (×2): qty 1

## 2017-11-10 MED ORDER — PROMETHAZINE HCL 25 MG/ML IJ SOLN
12.5000 mg | Freq: Once | INTRAMUSCULAR | Status: AC
  Administered 2017-11-10: 12.5 mg via INTRAVENOUS
  Filled 2017-11-10: qty 1

## 2017-11-10 NOTE — Progress Notes (Signed)
Patient is drowsy with very little output, gained 2 lbs since admission

## 2017-11-10 NOTE — Plan of Care (Signed)
  Problem: Education: Goal: Knowledge of General Education information will improve Outcome: Progressing Goal: Knowledge of General Education information will improve Outcome: Progressing   Problem: Health Behavior/Discharge Planning: Goal: Ability to manage health-related needs will improve Outcome: Not Progressing   Problem: Clinical Measurements: Goal: Will remain free from infection Outcome: Progressing Note:  Patient has refused MRSA screening    Problem: Safety: Goal: Ability to remain free from injury will improve Outcome: Progressing   Problem: Skin Integrity: Goal: Risk for impaired skin integrity will decrease Outcome: Progressing Note:  Added moisture to dry legs   Problem: Activity: Goal: Capacity to carry out activities will improve Outcome: Progressing   Problem: Cardiac: Goal: Ability to achieve and maintain adequate cardiopulmonary perfusion will improve Outcome: Progressing

## 2017-11-10 NOTE — Consult Note (Signed)
Cardiology Consultation:   Patient ID: BALEN PINAULT; [id number]; 03/15/1976   Admit date: 11/09/2017 Date of Consult: 11/10/2017  Primary Care Provider: Massie Maroon, FNP Primary Cardiologist: Verne Carrow, MD  Primary Electrophysiologist:     Patient Profile:   Mark [name] is a 42 y.o. 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 Sharolyn Douglas.  History of Present Illness:   Mark [name] was admitted 1/30-2/1 with decompensated heart failure. He was started on hydralazine, nitrates, and nonselective beta blocker in the setting of cocaine use. He was admitted 2/7-2/11 with acute right MCA infarct. He presented in respiratory failure, placed on BiPAP and was positive for cocaine. Pt was felt to not be an anticoagulation candidate at that time and was treated with ASA. Pt left AMA. Pt was seen in [facility] 10/11/17 with shortness of breath and weakness. He saw Tereso Newcomer in clinic on 10/09/17 and had been off of his cardiac medications. He had since restarted his lasix, but was having financial difficulty getting the rest of his medications. Pt was discharged from the ER. He was seen in the ER again on 10/23/17 with SOB. He reported compliance on lasix at that time. He was treated for bronchitis with steroids with instructions to follow up with cardiology and PCP.  He presented to [facility] 11/09/17 with shortness of breath despite using his inhalers and taking his lasix. He reports cough productive of sputum and blood. He also reports chest pain.   On my interview, he describes onset of shortness of breath yesterday when using the bathroom. He could barely make it back to the couch. He also reports productive cough of green phlegm with blood. He called EMS and was admitted to IM. He  received IV lasix and felt a little better. On my exam, he is found sleeping flat on his side on room air. He reports SOB and chronic orthopnea. He reports feeling better since his last discharge from the ED until yesterday. He says he has been compliant on his medications. He says he doesn't understand his heart problems.   Of note, he missed an appt with the CHF clinic in Feb 2019.   Past Medical History:  Diagnosis Date  . Asthma   . Chronic systolic CHF (congestive heart failure) (HCC)   . Cigarette smoker   . CKD (chronic kidney disease), stage II    [name] 10/01/2017  . History of echocardiogram    a. Echo 5/17 - EF 20-25%, severe diff HK, restrictive physiology, mild to mod MR, severe reduced RVSF, mod RVE, mild RAE, mod TR, PASP 48 mmHg  . Hx of cardiac cath    a. LHC 5/17 - normal coronary arteries. PA 45/25, mean 33, PCWP mean 18  . NICM (nonischemic cardiomyopathy) (HCC)   . Stroke (HCC) 09/27/2017   "was weak on my left side; I'm fully recovered" (11/09/2017)  . Substance abuse (HCC)    cocaine, marijuana    Past Surgical History:  Procedure Laterality Date  . CARDIAC CATHETERIZATION N/A 01/05/2016   Procedure: Right/Left Heart Cath and Coronary Angiography;  Surgeon: Lennette Bihari, MD;  Location: [facility] INVASIVE CV LAB;  Service: Cardiovascular;  Laterality: N/A;     Home Medications:  Prior to Admission medications   Medication Sig Start Date End Date Taking? Authorizing Provider  albuterol (PROVENTIL HFA;VENTOLIN HFA) 108 (90 Base) MCG/ACT inhaler Inhale 1-2 puffs into the lungs every 4 (four) hours as needed for wheezing or shortness of breath. 10/12/17  Yes Massie Maroon, FNP  aspirin 81 MG EC tablet Take 1 tablet (81 mg total) by mouth daily. 09/22/17  Yes Rai, Ripudeep K, MD  budesonide-formoterol (SYMBICORT) 80-4.5 MCG/ACT inhaler Inhale 2 puffs into the lungs 2 (two) times daily. 10/12/17  Yes Massie Maroon, FNP  carvedilol (COREG) 3.125 MG tablet Take 1 tablet  (3.125 mg total) by mouth 2 (two) times daily. Patient taking differently: Take 3.125 mg by mouth daily.  10/09/17 10/04/18 Yes Weaver, Scott T, PA-C  furosemide (LASIX) 40 MG tablet Take 1 tablet (40 mg total) by mouth daily. 10/09/17 10/04/18 Yes Weaver, Scott T, PA-C  Multiple Vitamin (MULTIVITAMIN WITH MINERALS) TABS tablet Take 1 tablet by mouth daily.   Yes [provider]  levofloxacin (LEVAQUIN) 750 MG tablet Take 1 tablet (750 mg total) by mouth daily. Patient not taking: Reported on 11/09/2017 10/12/17   Massie Maroon, FNP  metFORMIN (GLUCOPHAGE) 500 MG tablet Take 1 tablet (500 mg total) by mouth daily with breakfast. Patient not taking: Reported on 11/09/2017 10/12/17   Massie Maroon, FNP  montelukast (SINGULAIR) 10 MG tablet Take 1 tablet (10 mg total) by mouth at bedtime. Patient not taking: Reported on 11/09/2017 10/12/17   Massie Maroon, FNP    Inpatient Medications: Scheduled Meds: . aspirin EC  81 mg Oral Daily  . atorvastatin  80 mg Oral q1800  . carvedilol  3.125 mg Oral BID  . enoxaparin (LOVENOX) injection  40 mg Subcutaneous Q24H  . furosemide  40 mg Intravenous Q12H  . mometasone-formoterol  2 puff Inhalation BID   Continuous Infusions:  PRN Meds: acetaminophen **OR** acetaminophen, albuterol, ondansetron **OR** ondansetron (ZOFRAN) IV  Allergies:    Allergies  Allergen Reactions  . Hydrocodone Hives  . Lisinopril Swelling and Other (See Comments)    Facial swelling/angioedema  . Prednisone Shortness Of Breath, Nausea Only, Swelling and Other (See Comments)    Also made chest feel tight and genital area, legs, and face became swollen badly  . Penicillins Hives and Swelling     Has patient had a PCN reaction causing immediate rash, facial/tongue/throat swelling, SOB or lightheadedness with hypotension: Yes Has patient had a PCN reaction causing severe rash involving mucus membranes or skin necrosis: No Has patient had a PCN reaction that  required hospitalization: No Has patient had a PCN reaction occurring within the last 10 years: No If all of the above answers are "NO", then may proceed with Cephalosporin use.     Social History:   Social History   Socioeconomic History  . Marital status: Divorced    Spouse name: Not on file  . Number of children: Not on file  . Years of education: Not on file  . Highest education level: Not on file  Occupational History  . Not on file  Social Needs  . Financial resource strain: Not on file  . Food insecurity:    Worry: Not on file    Inability: Not on file  . Transportation needs:    Medical: Not on file    Non-medical: Not on file  Tobacco Use  . Smoking status: Former Smoker    Packs/day: 1.00    Years: 30.00    Pack years: 30.00    Types: Cigarettes    Last attempt to quit: 09/27/2017  Years since quitting: 0.1  . Smokeless tobacco: Never Used  Substance and Sexual Activity  . Alcohol use: Yes    Alcohol/week: 3.0 oz    Types: 5 Shots of liquor per week    Frequency: Never    Comment: 5-6 shots of vodka daily; "stopped it all after I had stroke 09/27/2017"  . Drug use: Yes    Types: Cocaine, Marijuana    Comment: 11/09/2017 "none since 09/27/2017"  . Sexual activity: Not Currently  Lifestyle  . Physical activity:    Days per week: Not on file    Minutes per session: Not on file  . Stress: Not on file  Relationships  . Social connections:    Talks on phone: Not on file    Gets together: Not on file    Attends religious service: Not on file    Active member of club or organization: Not on file    Attends meetings of clubs or organizations: Not on file    Relationship status: Not on file  . Intimate partner violence:    Fear of current or ex partner: Not on file    Emotionally abused: Not on file    Physically abused: Not on file    Forced sexual activity: Not on file  Other Topics Concern  . Not on file  Social History Narrative  . Not on file      Family History:    Family History  Problem Relation Age of Onset  . Cardiomyopathy Father        Reports his father has an LVAD  . Heart failure Father   . Hypertension Father   . Deep vein thrombosis Neg Hx      ROS:  Please see the history of present illness.  All other ROS reviewed and negative.     Physical Exam/Data:   Vitals:   11/10/17 0529 11/10/17 0755 11/10/17 0837 11/10/17 1152  BP:  98/70  101/78  Pulse:  (!) 109 (!) 106 99  Resp:  20 18 16   Temp:  (!) 97.4 F (36.3 C)  98.6 F (37 C)  TempSrc:  Oral  Oral  SpO2:  100% 100% 97%  Weight: 162 lb 3.2 oz (73.6 kg)     Height:        Intake/Output Summary (Last 24 hours) at 11/10/2017 1625 Last data filed at 11/10/2017 1525 Gross per 24 hour  Intake 840 ml  Output 100 ml  Net 740 ml   Filed Weights   11/09/17 1907 11/09/17 2250 11/10/17 0529  Weight: 164 lb 4.8 oz (74.5 kg) 160 lb 11.2 oz (72.9 kg) 162 lb 3.2 oz (73.6 kg)   Body mass index is 24.3 kg/m.  General:  Well nourished, well developed, in no acute distress HEENT: normal Neck: + JVD Vascular: No carotid bruits Cardiac:  normal S1, S2; RRR; no murmur  Lungs:  clear to auscultation bilaterally, no wheezing, rhonchi or rales, slightly diminished in bases Abd: soft, nontender, no hepatomegaly  Ext: 1+ B LE edema Musculoskeletal:  No deformities, BUE and BLE strength normal and equal Skin: warm and dry  Neuro:  CNs 2-12 intact, no focal abnormalities noted Psych:  Normal affect   EKG:  The EKG was personally reviewed and demonstrates:  Sinus tach, 109 bpm Telemetry:  Telemetry was personally reviewed and demonstrates:  Sinus tach in the 100s  Relevant CV Studies:  Echo 09/28/17: Study Conclusions - Left ventricle: The cavity size was normal. Systolic function was  normal. The estimated ejection fraction was in the range of 10%   to 15%. Severe diffuse hypokinesis. - Mitral valve: There was moderate to severe regurgitation. - Right  ventricle: The cavity size was mildly dilated. Wall   thickness was normal. Systolic function was mildly reduced. - Right atrium: The atrium was mildly to moderately dilated. - Pulmonary arteries: Systolic pressure was moderately increased.   PA peak pressure: 56 mm Hg (S).   Echo 09/21/17: Study Conclusions  - Left ventricle: The cavity size was severely dilated. Wall   thickness was increased in a pattern of mild LVH. The estimated   ejection fraction was 20%. Diffuse hypokinesis. Doppler   parameters are consistent with both elevated ventricular   end-diastolic filling pressure and elevated left atrial filling   pressure. - Mitral valve: Calcified annulus. Mildly thickened leaflets .   There was mild regurgitation. - Left atrium: The atrium was mildly dilated. - Right ventricle: The cavity size was mildly dilated. - Right atrium: The atrium was mildly dilated. - Atrial septum: No defect or patent foramen ovale was identified. - Pulmonary arteries: PA peak pressure: 41 mm Hg (S).   Cardiac catheterization 01/05/16 Dilated congestive nonischemic cardiomyopathy with echo Doppler assessment of EF at 20 - 25%. Elevation of right heart pressures with moderate pulmonary hypertension. Mean PA 33, PCWP mean 22, CO 3.13, CI 1.53 Normal coronary arteries.    Laboratory Data:  Chemistry Recent Labs  Lab 11/09/17 1812 11/09/17 2349 11/10/17 0523  NA 138  --  138  K 4.6  --  5.9*  CL 103  --  101  CO2 22  --  22  GLUCOSE 94  --  77  BUN 20  --  26*  CREATININE 1.43* 1.67* 1.91*  CALCIUM 9.2  --  9.4  GFRNONAA 59* 49* 41*  GFRAA >60 56* 48*  ANIONGAP 13  --  15    Recent Labs  Lab 11/09/17 1812 11/10/17 0523  PROT 6.7 7.2  ALBUMIN 3.2* 3.2*  AST 54* 106*  ALT 35 54  ALKPHOS 100 116  BILITOT 2.6* 3.2*   Hematology Recent Labs  Lab 11/09/17 1812 11/09/17 2349 11/10/17 0523  WBC 6.3 6.7 7.4  RBC 4.93 4.68 5.30  5.30  HGB 12.5* 11.8* 13.2  HCT 38.3* 36.9* 41.8   MCV 77.7* 78.8 78.9  MCH 25.4* 25.2* 24.9*  MCHC 32.6 32.0 31.6  RDW 14.7 15.1 15.2  PLT 226 221 226   Cardiac Enzymes Recent Labs  Lab 11/09/17 1812 11/09/17 2349 11/10/17 0523 11/10/17 1004  TROPONINI 0.19* 0.15* 0.12* 0.09*   No results for input(s): TROPIPOC in the last 168 hours.  BNP Recent Labs  Lab 11/09/17 1812  BNP 3,047.5*    DDimer  Recent Labs  Lab 11/10/17 0523  DDIMER 5.11*    Radiology/Studies:  Dg Chest 2 View  Result Date: 11/09/2017 CLINICAL DATA:  Shortness of breath, cough, and hemoptysis. EXAM: CHEST - 2 VIEW COMPARISON:  Chest x-ray dated October 23, 2017. FINDINGS: Stable mild cardiomegaly. Normal pulmonary vascularity. Chronic bronchitic changes again noted. The lungs remain hyperinflated. No focal consolidation, pleural effusion, or pneumothorax. No acute osseous abnormality. IMPRESSION: No active cardiopulmonary disease. Electronically Signed   By: Obie Dredge M.D.   On: 11/09/2017 19:26    Assessment and Plan:   1. Nonischemic cardiomyopathy, acute on chronic systolic heart failure, EF 10-15% - he was started on 40 mg IV lasix BID - his home dose appears to be 40 mg  lasix daily, he reports compliance - he is not extremely volume overloaded, will transition IV lasix back to PO dosing - weight on admission was 166 lbs - difficult to determine what his dry weight is given his medical noncompliance and multiple ER visits - BNP > 3000 with sCr 1.43 - continue coreg, holding hydralazine and nitrates for marginal pressures - these are not listed in his home medications - unclear what exactly he was taking at home   2. Chest pain - cardiac catheterization in 2017 reassuring that CAD is unlikely obstructive at this point - D-dimer is elevated - tropnin mildly elevated, hx of chronically mildly elevated troponin; EKG without signs of acute ischemia - CTA on 10/23/17 negative for PE - may benefit from a VQ scan given renal function   3.  Polysubstance abuse - UDA was negative this admission   For questions or updates, please contact [facility] Please consult [email or web address] for contact info under Cardiology/STEMI.   Signed, Roe Rutherford Duke, [place]  11/10/2017 4:25 PM

## 2017-11-10 NOTE — Progress Notes (Signed)
Pt experiencing vomiting did not witness, stated that he flushed it. Paged MD Ordered Delsym and phenergan x1.

## 2017-11-10 NOTE — Progress Notes (Signed)
PROGRESS NOTE  Mark [name] QBH:[id number] DOB: Aug 28, 1975 DOA: 11/09/2017 PCP: Massie Maroon, FNP  HPI/Recap of past 24 hours: Mark [name] is a 42 y.o. male with history of nonischemic cardiomyopathy, asthma, recent stroke in February 2019, presents to the ER because of increasing SOB which started 1 day PTA, reported difficulty walking from his bathroom to the room. Pt also had some chest tightness on exertion, productive cough with clear sputum and increasing lower extremity edema. Patient reports compliance with his Lasix. In the ER, CXR was clear. BNP is markedly elevated at 3000. Troponin is mildly elevated, EKG shows normal sinus rhythm with nonspecific ST-T changes.  Patient was given Lasix 40 mg IV and admitted for further management.  Today, pt still short of breath, denies any further chest pain, abdominal pain.  Minimal cough, not in acute distress, not requiring oxygen.  Overall deconditioned   Assessment/Plan: Principal Problem:   Acute systolic CHF (congestive heart failure) (HCC) Active Problems:   Asthma   NICM (nonischemic cardiomyopathy) (HCC)   CKD (chronic kidney disease) stage 2, GFR 60-89 ml/min  Acute on chronic systolic heart failure/NICM Worsening shortness of breath, overall deconditioned, ??compliance to meds BNP >3000, doubled from previous admission Chest x-ray unremarkable, no congestions, no consolidation Echo in February 2019 showed EF of 10-15% which has been gradually worsening over the years Cardiac cath in 2017 unremarkable Continue IV Lasix 40 twice daily, bidil, coreg Strict I's and O's, daily weights Cardiology consulted  Elevated troponin Flat, trending downwards, no chest pain EKG no acute ST changes Likely due to above Cardiology consulted  Elevated d-dimer VQ scan pending  AKI on CKD stage III Baseline around 1.3-1.4, currently 1.91 Monitor closely, avoid nephrotoxin Daily BMP  History of CVA in 2/19 Currently no focal  neurologic deficit Continue aspirin and statin  History of asthma Stable Continue duo nebs, inhalers  Polysubstance abuse History of cocaine and alcohol abuse Denies any recent use since his stroke in February We will order UDS, pending  Low TSH Repeat once clinically stable as an outpatient   Code Status: Full  Family Communication: None at bedside  Disposition Plan: Pending clinical improvement   Consultants:  Cardiology consulted  Procedures:  None  Antimicrobials:  None  DVT prophylaxis: Lovenox   Objective: Vitals:   11/10/17 0529 11/10/17 0755 11/10/17 0837 11/10/17 1152  BP:  98/70  101/78  Pulse:  (!) 109 (!) 106 99  Resp:  20 18 16   Temp:  (!) 97.4 F (36.3 C)  98.6 F (37 C)  TempSrc:  Oral  Oral  SpO2:  100% 100% 97%  Weight: 73.6 kg (162 lb 3.2 oz)     Height:        Intake/Output Summary (Last 24 hours) at 11/10/2017 1634 Last data filed at 11/10/2017 1525 Gross per 24 hour  Intake 840 ml  Output 100 ml  Net 740 ml   Filed Weights   11/09/17 1907 11/09/17 2250 11/10/17 0529  Weight: 74.5 kg (164 lb 4.8 oz) 72.9 kg (160 lb 11.2 oz) 73.6 kg (162 lb 3.2 oz)    Exam:   General: NAD, ill looking, deconditioned  Cardiovascular: S1, S2, JVD elevated  Respiratory: Diminished breath sounds bilaterally  Abdomen: Soft, nontender, nondistended, bowel sounds present  Musculoskeletal: Bilateral lower extremity edema  Skin: Normal  Psychiatry: Normal mood   Data Reviewed: CBC: Recent Labs  Lab 11/09/17 1812 11/09/17 2349 11/10/17 0523  WBC 6.3 6.7 7.4  NEUTROABS 3.4  --   --  HGB 12.5* 11.8* 13.2  HCT 38.3* 36.9* 41.8  MCV 77.7* 78.8 78.9  PLT 226 221 226   Basic Metabolic Panel: Recent Labs  Lab 11/09/17 1812 11/09/17 2349 11/10/17 0523  NA 138  --  138  K 4.6  --  5.9*  CL 103  --  101  CO2 22  --  22  GLUCOSE 94  --  77  BUN 20  --  26*  CREATININE 1.43* 1.67* 1.91*  CALCIUM 9.2  --  9.4  MG  --  1.8  --      GFR: Estimated Creatinine Clearance: 49.1 mL/min (A) (by C-G formula based on SCr of 1.91 mg/dL (H)). Liver Function Tests: Recent Labs  Lab 11/09/17 1812 11/10/17 0523  AST 54* 106*  ALT 35 54  ALKPHOS 100 116  BILITOT 2.6* 3.2*  PROT 6.7 7.2  ALBUMIN 3.2* 3.2*   Recent Labs  Lab 11/09/17 1812  LIPASE 30   No results for input(s): AMMONIA in the last 168 hours. Coagulation Profile: No results for input(s): INR, PROTIME in the last 168 hours. Cardiac Enzymes: Recent Labs  Lab 11/09/17 1812 11/09/17 2349 11/10/17 0523 11/10/17 1004  TROPONINI 0.19* 0.15* 0.12* 0.09*   BNP (last 3 results) No results for input(s): PROBNP in the last 8760 hours. HbA1C: No results for input(s): HGBA1C in the last 72 hours. CBG: No results for input(s): GLUCAP in the last 168 hours. Lipid Profile: No results for input(s): CHOL, HDL, LDLCALC, TRIG, CHOLHDL, LDLDIRECT in the last 72 hours. Thyroid Function Tests: Recent Labs    11/09/17 2349  TSH 0.289*   Anemia Panel: Recent Labs    11/10/17 0523  VITAMINB12 961*  FOLATE 17.7  FERRITIN 150  TIBC 372  IRON 180  RETICCTPCT 1.4   Urine analysis:    Component Value Date/Time   COLORURINE AMBER (A) 10/11/2017 1920   APPEARANCEUR CLEAR 10/11/2017 1920   LABSPEC 1.031 (H) 10/11/2017 1920   PHURINE 5.0 10/11/2017 1920   GLUCOSEU NEGATIVE 10/11/2017 1920   HGBUR NEGATIVE 10/11/2017 1920   BILIRUBINUR NEGATIVE 10/11/2017 1920   KETONESUR 5 (A) 10/11/2017 1920   PROTEINUR 100 (A) 10/11/2017 1920   NITRITE NEGATIVE 10/11/2017 1920   LEUKOCYTESUR NEGATIVE 10/11/2017 1920   Sepsis Labs: @LABRCNTIP (procalcitonin:4,lacticidven:4)  )No results found for this or any previous visit (from the past 240 hour(s)).    Studies: Dg Chest 2 View  Result Date: 11/09/2017 CLINICAL DATA:  Shortness of breath, cough, and hemoptysis. EXAM: CHEST - 2 VIEW COMPARISON:  Chest x-ray dated October 23, 2017. FINDINGS: Stable mild cardiomegaly.  Normal pulmonary vascularity. Chronic bronchitic changes again noted. The lungs remain hyperinflated. No focal consolidation, pleural effusion, or pneumothorax. No acute osseous abnormality. IMPRESSION: No active cardiopulmonary disease. Electronically Signed   By: Obie Dredge M.D.   On: 11/09/2017 19:26    Scheduled Meds: . aspirin EC  81 mg Oral Daily  . atorvastatin  80 mg Oral q1800  . carvedilol  3.125 mg Oral BID  . enoxaparin (LOVENOX) injection  40 mg Subcutaneous Q24H  . furosemide  40 mg Intravenous Q12H  . mometasone-formoterol  2 puff Inhalation BID    Continuous Infusions:   LOS: 1 day     Briant Cedar, MD [facility]   If 7PM-7AM, please contact night-coverage [email or web address] Password [facility] 11/10/2017, 4:34 PM

## 2017-11-10 NOTE — Progress Notes (Signed)
Nutrition Brief Note  Patient identified on the Malnutrition Screening Tool (MST) Report  Wt Readings from Last 15 Encounters:  11/10/17 162 lb 3.2 oz (73.6 kg)  10/12/17 166 lb (75.3 kg)  10/09/17 168 lb 12.8 oz (76.6 kg)  09/29/17 165 lb 5.5 oz (75 kg)  09/22/17 170 lb 11.2 oz (77.4 kg)  02/08/16 175 lb (79.4 kg)  01/06/16 192 lb 0.3 oz (87.1 kg)   Mark [name] is a 42 y.o. male with history of nonischemic cardiomyopathy, asthma recent stroke in February 1 week 2019 last month presents to the ER because of increasing shortness of breath.  Patient's shortness of breath started this morning.  Patient states he felt difficult to walk from his bathroom to the room.  He also had some chest tightness.  Chest tightness only on exertion.  Has some cough with clear sputum.  Denies any fever or chills.  Has been noticing increasing lower extremity edema.  Patient states he has been compliant with his Lasix.    Pt admitted with CHF exacerbation.  Pt sleeping soundly at time of visit. Pt did not arouse when name was called.   Suspect wt loss may be related to fluid changes from CHF.   Nutrition-Focused physical exam completed. Findings are no fat depletion, no muscle depletion, and no edema.   Body mass index is 24.3 kg/m. Patient meets criteria for normal weight range based on current BMI.   Current diet order is Heart Healthy, patient is consuming approximately 100% of meals at this time. Labs and medications reviewed.   No nutrition interventions warranted at this time. If nutrition issues arise, please consult RD.   Edla Para A. Mayford Knife, RD, LDN, CDE Pager: [phone] After hours Pager: [phone]

## 2017-11-10 NOTE — Progress Notes (Signed)
Pt refused his MRSA PCR and FLU swab, MD aware, RN educate but pt is not ready to be convinced, he said he had an infection on previous admission because of this, will continue to monitor  Lonia Farber, RN

## 2017-11-10 NOTE — Progress Notes (Signed)
Pt is alert and oriented vital table low pressures, pt was previously positive for MRSA has refused to be re swabbed stated that he had an allergic reaction to swab materials. Educated and stated that we will have to resume contact precautions.

## 2017-11-10 NOTE — Progress Notes (Signed)
Patient refused bed alarm. Will continue to monitor patient. 

## 2017-11-11 DIAGNOSIS — R748 Abnormal levels of other serum enzymes: Secondary | ICD-10-CM

## 2017-11-11 DIAGNOSIS — Z9119 Patient's noncompliance with other medical treatment and regimen: Secondary | ICD-10-CM

## 2017-11-11 DIAGNOSIS — I5021 Acute systolic (congestive) heart failure: Secondary | ICD-10-CM

## 2017-11-11 DIAGNOSIS — Z91199 Patient's noncompliance with other medical treatment and regimen due to unspecified reason: Secondary | ICD-10-CM

## 2017-11-11 LAB — URINALYSIS, ROUTINE W REFLEX MICROSCOPIC
Bilirubin Urine: NEGATIVE
Glucose, UA: NEGATIVE mg/dL
Hgb urine dipstick: NEGATIVE
Ketones, ur: NEGATIVE mg/dL
LEUKOCYTES UA: NEGATIVE
NITRITE: NEGATIVE
PH: 5 (ref 5.0–8.0)
Protein, ur: NEGATIVE mg/dL
Specific Gravity, Urine: 1.02 (ref 1.005–1.030)

## 2017-11-11 LAB — CBC WITH DIFFERENTIAL/PLATELET
BASOS ABS: 0 10*3/uL (ref 0.0–0.1)
BASOS PCT: 0 %
Eosinophils Absolute: 0 10*3/uL (ref 0.0–0.7)
Eosinophils Relative: 0 %
HEMATOCRIT: 39 % (ref 39.0–52.0)
HEMOGLOBIN: 12.6 g/dL — AB (ref 13.0–17.0)
Lymphocytes Relative: 13 %
Lymphs Abs: 1.4 10*3/uL (ref 0.7–4.0)
MCH: 24.9 pg — ABNORMAL LOW (ref 26.0–34.0)
MCHC: 32.3 g/dL (ref 30.0–36.0)
MCV: 77.1 fL — ABNORMAL LOW (ref 78.0–100.0)
Monocytes Absolute: 0.6 10*3/uL (ref 0.1–1.0)
Monocytes Relative: 5 %
NEUTROS ABS: 9.1 10*3/uL — AB (ref 1.7–7.7)
NEUTROS PCT: 82 %
Platelets: 233 10*3/uL (ref 150–400)
RBC: 5.06 MIL/uL (ref 4.22–5.81)
RDW: 14.7 % (ref 11.5–15.5)
WBC: 11.2 10*3/uL — AB (ref 4.0–10.5)

## 2017-11-11 LAB — BASIC METABOLIC PANEL
ANION GAP: 17 — AB (ref 5–15)
BUN: 39 mg/dL — ABNORMAL HIGH (ref 6–20)
CO2: 22 mmol/L (ref 22–32)
Calcium: 9.3 mg/dL (ref 8.9–10.3)
Chloride: 99 mmol/L — ABNORMAL LOW (ref 101–111)
Creatinine, Ser: 2.33 mg/dL — ABNORMAL HIGH (ref 0.61–1.24)
GFR, EST AFRICAN AMERICAN: 38 mL/min — AB (ref >60)
GFR, EST NON AFRICAN AMERICAN: 33 mL/min — AB (ref >60)
Glucose, Bld: 63 mg/dL — ABNORMAL LOW (ref 65–99)
Potassium: 5.9 mmol/L — ABNORMAL HIGH (ref 3.5–5.1)
SODIUM: 138 mmol/L (ref 135–145)

## 2017-11-11 MED ORDER — DM-GUAIFENESIN ER 30-600 MG PO TB12
1.0000 | ORAL_TABLET | Freq: Two times a day (BID) | ORAL | Status: DC
  Administered 2017-11-11: 1 via ORAL
  Filled 2017-11-11 (×4): qty 1

## 2017-11-11 MED ORDER — IVABRADINE HCL 5 MG PO TABS
5.0000 mg | ORAL_TABLET | Freq: Two times a day (BID) | ORAL | Status: DC
  Administered 2017-11-11 – 2017-11-14 (×7): 5 mg via ORAL
  Filled 2017-11-11 (×8): qty 1

## 2017-11-11 NOTE — Plan of Care (Signed)
  Problem: Activity: Goal: Risk for activity intolerance will decrease Outcome: Completed/Met   Problem: Elimination: Goal: Will not experience complications related to bowel motility Outcome: Completed/Met   Problem: Pain Managment: Goal: General experience of comfort will improve Outcome: Completed/Met   Problem: Skin Integrity: Goal: Risk for impaired skin integrity will decrease Outcome: Completed/Met

## 2017-11-11 NOTE — Care Management Note (Signed)
Case Management Note  Patient Details  Name: Mark [name] MRN: [id number] Date of Birth: [birth year]  Subjective/Objective:   CHF                Action/Plan: Patient lives at home; goes to the [facility] for follow up care and she can get her medication there also; Patient is not eligible for the medication assistance program because he has recently used the program; ( patient's can only use the program once a year). Noted 3 admissions in 6 months; Financial Counselor to see patient to determine what he might qualify for. CM will continue to follow for progression of care. Abelino Derrick RN,MHA,BSN  10/01/2017 - Subjective/Objective:  42 y.o. M who had been seen by CM last week and assisted with meds and appt with [facility] on Oct 02, 2017. MD would like for pt to have HHPT/OT/RN however he would be charity and also has no PCP of record. Will see [facility] on Monday Feb 11 and Jermaine, rep for [facility] assures me [facility] can be arranged from there. CM will send In basket message to Robyne Peers to assist with this process. G Crutchfield RN CM    Expected Discharge Date:   possibly 11/15/2017               Expected Discharge Plan:  Home w Home Health Services  In-House Referral:   Financial Counselor  Discharge planning Services  CM Consult  Choice offered to:  Patient  HH Arranged:  RN, Disease Management HH Agency:  [facility]  Status of Service:  In process, will continue to follow  Reola Mosher [phone] 11/11/2017, 9:43 AM

## 2017-11-11 NOTE — Progress Notes (Addendum)
Progress Note  Patient Name: Mark [name] Date of Encounter: 11/11/2017  Primary Cardiologist: Verne Carrow, MD   Subjective   The patient states he feels better, he is very sleepy.   Inpatient Medications    Scheduled Meds: . aspirin EC  81 mg Oral Daily  . atorvastatin  80 mg Oral q1800  . carvedilol  3.125 mg Oral BID  . dextromethorphan-guaiFENesin  1 tablet Oral BID  . enoxaparin (LOVENOX) injection  40 mg Subcutaneous Q24H  . furosemide  40 mg Oral Daily  . mometasone-formoterol  2 puff Inhalation BID   Continuous Infusions:  PRN Meds: acetaminophen **OR** acetaminophen, albuterol, ondansetron **OR** ondansetron (ZOFRAN) IV   Vital Signs    Vitals:   11/10/17 1842 11/10/17 2014 11/11/17 0614 11/11/17 0937  BP:  101/72 94/65   Pulse: 100 (!) 104 96   Resp: 20 18 18    Temp:  (!) 97.5 F (36.4 C) 98 F (36.7 C)   TempSrc:  Oral Oral   SpO2: 100% 98% 96% 94%  Weight:   159 lb 14.4 oz (72.5 kg)   Height:        Intake/Output Summary (Last 24 hours) at 11/11/2017 1053 Last data filed at 11/11/2017 0903 Gross per 24 hour  Intake 600 ml  Output 1200 ml  Net -600 ml   Filed Weights   11/09/17 2250 11/10/17 0529 11/11/17 0614  Weight: 160 lb 11.2 oz (72.9 kg) 162 lb 3.2 oz (73.6 kg) 159 lb 14.4 oz (72.5 kg)   Telemetry    SR - Personally Reviewed  ECG    SR - Personally Reviewed  Physical Exam   GEN: No acute distress.   Neck: No JVD Cardiac: RRR, no murmurs, rubs, or gallops.  Respiratory: Mild crackles bilaterally. GI: Soft, nontender, non-distended  MS: No edema; No deformity. Neuro:  Nonfocal  Psych: Normal affect   Labs    Chemistry Recent Labs  Lab 11/09/17 1812 11/09/17 2349 11/10/17 0523 11/11/17 0233  NA 138  --  138 138  K 4.6  --  5.9* 5.9*  CL 103  --  101 99*  CO2 22  --  22 22  GLUCOSE 94  --  77 63*  BUN 20  --  26* 39*  CREATININE 1.43* 1.67* 1.91* 2.33*  CALCIUM 9.2  --  9.4 9.3  PROT 6.7  --  7.2  --     ALBUMIN 3.2*  --  3.2*  --   AST 54*  --  106*  --   ALT 35  --  54  --   ALKPHOS 100  --  116  --   BILITOT 2.6*  --  3.2*  --   GFRNONAA 59* 49* 41* 33*  GFRAA >60 56* 48* 38*  ANIONGAP 13  --  15 17*     Hematology Recent Labs  Lab 11/09/17 2349 11/10/17 0523 11/11/17 0233  WBC 6.7 7.4 11.2*  RBC 4.68 5.30  5.30 5.06  HGB 11.8* 13.2 12.6*  HCT 36.9* 41.8 39.0  MCV 78.8 78.9 77.1*  MCH 25.2* 24.9* 24.9*  MCHC 32.0 31.6 32.3  RDW 15.1 15.2 14.7  PLT 221 226 233   Cardiac Enzymes Recent Labs  Lab 11/09/17 1812 11/09/17 2349 11/10/17 0523 11/10/17 1004  TROPONINI 0.19* 0.15* 0.12* 0.09*   No results for input(s): TROPIPOC in the last 168 hours.   BNP Recent Labs  Lab 11/09/17 1812  BNP 3,047.5*    DDimer  Recent  Labs  Lab 11/10/17 0523  DDIMER 5.11*    Radiology    Dg Chest 2 View  Result Date: 11/09/2017 CLINICAL DATA:  Shortness of breath, cough, and hemoptysis. EXAM: CHEST - 2 VIEW COMPARISON:  Chest x-ray dated October 23, 2017. FINDINGS: Stable mild cardiomegaly. Normal pulmonary vascularity. Chronic bronchitic changes again noted. The lungs remain hyperinflated. No focal consolidation, pleural effusion, or pneumothorax. No acute osseous abnormality. IMPRESSION: No active cardiopulmonary disease. Electronically Signed   By: Obie Dredge M.D.   On: 11/09/2017 19:26   Nm Pulmonary Perf And Vent  Result Date: 11/10/2017 CLINICAL DATA:  Increasing shortness of breath EXAM: NUCLEAR MEDICINE VENTILATION - PERFUSION LUNG SCAN TECHNIQUE: Ventilation images were obtained in multiple projections using inhaled aerosol Tc-55m DTPA. Perfusion images were obtained in multiple projections after intravenous injection of Tc-35m-MAA. RADIOPHARMACEUTICALS:  32.7 mCi of Tc-18m DTPA aerosol inhalation and 4.3 mCi Tc25m-MAA IV COMPARISON:  Chest x-ray 11/09/2017, CT chest 10/23/2017 FINDINGS: Ventilation: Markedly heterogeneous ventilation with multiple bilateral defects in  addition to central airways clumping of activity. Perfusion: Markedly heterogeneous with multiple bilateral defects. IMPRESSION: 1. Markedly heterogeneous perfusion and ventilation with multiple bilateral matched defects and no corresponding opacity on x-ray. Overall low probability for pulmonary embolus; constellation of findings consistent with obstructive airways disease. Electronically Signed   By: Jasmine Pang M.D.   On: 11/10/2017 18:44    Cardiac Studies    Patient Profile     42 y.o. male   Assessment & Plan    1. Nonischemic cardiomyopathy, acute on chronic systolic heart failure, EF 10-15% - he was started on 40 mg IV lasix BID, - his home dose appears to be 40 mg lasix daily, he reports compliance - he is not very volume overloaded, will transition IV lasix back to PO dosing - weight on admission was 166 lbs - difficult to determine what his dry weight is given his medical noncompliance and multiple ER visits - BNP > 3000 with sCr 1.43 - continue coreg, holding hydralazine and nitrates for marginal pressures - these are not listed in his home medications - unclear what exactly he was taking at home  2. Chest pain - cardiac catheterization in 2017 reassuring that CAD is unlikely obstructive at this point - D-dimer is elevated - tropnin mildly elevated, hx of chronically mildly elevated troponin; EKG without signs of acute ischemia - CTA on 10/23/17 negative for PE - may benefit from a VQ scan given renal function  3. Polysubstance abuse - UDA was negative this admission  Impression: acute on chronic systolic CHF. Known nonischemic CM. Compliance is a major issue. With rising creatinine I would switch IV lasix to po. He doesn't appear to have that much volume overload. Important to resume CHF therapy with Coreg, nitrates and hydralazine. Not a candidate for ACEi/ARB/aldactone/ ANI due to renal insufficiency. V/Q scan with overall low probability for pulmonary embolus.  - he  is very tachycardic, I would start Ivabradin 5 mg po BID  Anticipated discharge tomorrow.  For questions or updates, please contact [facility] Please consult [email or web address] for contact info under Cardiology/STEMI.     Signed, Tobias Alexander, MD  11/11/2017, 10:53 AM

## 2017-11-11 NOTE — Progress Notes (Signed)
PROGRESS NOTE  Mark [name]:[id number] DOB: 07/20/1976 DOA: 11/09/2017 PCP: Massie Maroon, FNP  HPI/Recap of past 24 hours: Mark [name] is a 42 y.o. male with history of nonischemic cardiomyopathy, asthma, recent stroke in February 2019, presents to the ER because of increasing SOB which started 1 day PTA, reported difficulty walking from his bathroom to the room. Pt also had some chest tightness on exertion, productive cough with clear sputum and increasing lower extremity edema. Patient reports compliance with his Lasix. In the ER, CXR was clear. BNP is markedly elevated at 3000. Troponin is mildly elevated, EKG shows normal sinus rhythm with nonspecific ST-T changes.  Patient was given Lasix 40 mg IV and admitted for further management.  Today, pt still short of breath, denies any further chest pain, abdominal pain.  Still coughing, not in acute distress, not requiring oxygen.  Very tired and sleepy, overall deconditioned.  Expresses frustration about medical condition.  Had an extensive conversation with patient about overall medical condition, heart failure, the importance of compliance to medications and appointments.  Completely refused to get MRSA swab or flu swab, reported it gave him infection in his nose   Assessment/Plan: Principal Problem:   Acute systolic CHF (congestive heart failure) (HCC) Active Problems:   Asthma   Elevated troponin I level   Acute on chronic systolic CHF (congestive heart failure) (HCC)   NICM (nonischemic cardiomyopathy) (HCC)   CKD (chronic kidney disease) stage 2, GFR 60-89 ml/min   Chronic anemia   Noncompliance  Acute on chronic systolic heart failure/NICM Worsening shortness of breath, overall deconditioned, ??compliance to meds BNP >3000, doubled from previous admission Chest x-ray unremarkable, no congestions, no consolidation Echo in February 2019 showed EF of 10-15% which has been gradually worsening over the years Cardiac cath  in 2017 unremarkable Continue PO Lasix, coreg, cardiology started patient on ivabradine due to tachycardia Strict I's and O's, daily weights Cardiology on board  Elevated troponin Flat, trending downwards, no chest pain EKG no acute ST changes Likely due to above Cardiology consulted  Elevated d-dimer VQ scan, low probability for PE  AKI on CKD stage III Baseline around 1.3-1.4, currently 2.33 Monitor closely, avoid nephrotoxin Daily BMP  History of CVA in 2/19 Currently no focal neurologic deficit Continue aspirin and statin  History of asthma Stable Continue duo nebs, inhalers  Polysubstance abuse History of cocaine and alcohol abuse Denies any recent use since his stroke in February UDS negative  Low TSH Repeat once clinically stable as an outpatient   Code Status: Full  Family Communication: None at bedside  Disposition Plan: Pending clinical improvement   Consultants:  Cardiology   Procedures:  None  Antimicrobials:  None  DVT prophylaxis: Lovenox   Objective: Vitals:   11/10/17 2014 11/11/17 0614 11/11/17 0937 11/11/17 1141  BP: 101/72 94/65  94/67  Pulse: (!) 104 96  95  Resp: 18 18  18   Temp: (!) 97.5 F (36.4 C) 98 F (36.7 C)  98.5 F (36.9 C)  TempSrc: Oral Oral  Oral  SpO2: 98% 96% 94% 97%  Weight:  72.5 kg (159 lb 14.4 oz)    Height:        Intake/Output Summary (Last 24 hours) at 11/11/2017 1531 Last data filed at 11/11/2017 1300 Gross per 24 hour  Intake 600 ml  Output 700 ml  Net -100 ml   Filed Weights   11/09/17 2250 11/10/17 0529 11/11/17 0614  Weight: 72.9 kg (160 lb 11.2 oz)  73.6 kg (162 lb 3.2 oz) 72.5 kg (159 lb 14.4 oz)    Exam:   General: NAD, ill looking, deconditioned  Cardiovascular: S1, S2, JVD elevated  Respiratory: Diminished breath sounds bilaterally  Abdomen: Soft, nontender, nondistended, bowel sounds present  Musculoskeletal: Bilateral lower extremity edema  Skin: Normal  Psychiatry:  Normal mood   Data Reviewed: CBC: Recent Labs  Lab 11/09/17 1812 11/09/17 2349 11/10/17 0523 11/11/17 0233  WBC 6.3 6.7 7.4 11.2*  NEUTROABS 3.4  --   --  9.1*  HGB 12.5* 11.8* 13.2 12.6*  HCT 38.3* 36.9* 41.8 39.0  MCV 77.7* 78.8 78.9 77.1*  PLT 226 221 226 233   Basic Metabolic Panel: Recent Labs  Lab 11/09/17 1812 11/09/17 2349 11/10/17 0523 11/11/17 0233  NA 138  --  138 138  K 4.6  --  5.9* 5.9*  CL 103  --  101 99*  CO2 22  --  22 22  GLUCOSE 94  --  77 63*  BUN 20  --  26* 39*  CREATININE 1.43* 1.67* 1.91* 2.33*  CALCIUM 9.2  --  9.4 9.3  MG  --  1.8  --   --    GFR: Estimated Creatinine Clearance: 40.2 mL/min (A) (by C-G formula based on SCr of 2.33 mg/dL (H)). Liver Function Tests: Recent Labs  Lab 11/09/17 1812 11/10/17 0523  AST 54* 106*  ALT 35 54  ALKPHOS 100 116  BILITOT 2.6* 3.2*  PROT 6.7 7.2  ALBUMIN 3.2* 3.2*   Recent Labs  Lab 11/09/17 1812  LIPASE 30   No results for input(s): AMMONIA in the last 168 hours. Coagulation Profile: No results for input(s): INR, PROTIME in the last 168 hours. Cardiac Enzymes: Recent Labs  Lab 11/09/17 1812 11/09/17 2349 11/10/17 0523 11/10/17 1004  TROPONINI 0.19* 0.15* 0.12* 0.09*   BNP (last 3 results) No results for input(s): PROBNP in the last 8760 hours. HbA1C: No results for input(s): HGBA1C in the last 72 hours. CBG: No results for input(s): GLUCAP in the last 168 hours. Lipid Profile: No results for input(s): CHOL, HDL, LDLCALC, TRIG, CHOLHDL, LDLDIRECT in the last 72 hours. Thyroid Function Tests: Recent Labs    11/09/17 2349  TSH 0.289*   Anemia Panel: Recent Labs    11/10/17 0523  VITAMINB12 961*  FOLATE 17.7  FERRITIN 150  TIBC 372  IRON 180  RETICCTPCT 1.4   Urine analysis:    Component Value Date/Time   COLORURINE AMBER (A) 10/11/2017 1920   APPEARANCEUR CLEAR 10/11/2017 1920   LABSPEC 1.031 (H) 10/11/2017 1920   PHURINE 5.0 10/11/2017 1920   GLUCOSEU  NEGATIVE 10/11/2017 1920   HGBUR NEGATIVE 10/11/2017 1920   BILIRUBINUR NEGATIVE 10/11/2017 1920   KETONESUR 5 (A) 10/11/2017 1920   PROTEINUR 100 (A) 10/11/2017 1920   NITRITE NEGATIVE 10/11/2017 1920   LEUKOCYTESUR NEGATIVE 10/11/2017 1920   Sepsis Labs: @LABRCNTIP (procalcitonin:4,lacticidven:4)  )No results found for this or any previous visit (from the past 240 hour(s)).    Studies: Nm Pulmonary Perf And Vent  Result Date: 11/10/2017 CLINICAL DATA:  Increasing shortness of breath EXAM: NUCLEAR MEDICINE VENTILATION - PERFUSION LUNG SCAN TECHNIQUE: Ventilation images were obtained in multiple projections using inhaled aerosol Tc-68m DTPA. Perfusion images were obtained in multiple projections after intravenous injection of Tc-75m-MAA. RADIOPHARMACEUTICALS:  32.7 mCi of Tc-64m DTPA aerosol inhalation and 4.3 mCi Tc1m-MAA IV COMPARISON:  Chest x-ray 11/09/2017, CT chest 10/23/2017 FINDINGS: Ventilation: Markedly heterogeneous ventilation with multiple bilateral defects in addition to  central airways clumping of activity. Perfusion: Markedly heterogeneous with multiple bilateral defects. IMPRESSION: 1. Markedly heterogeneous perfusion and ventilation with multiple bilateral matched defects and no corresponding opacity on x-ray. Overall low probability for pulmonary embolus; constellation of findings consistent with obstructive airways disease. Electronically Signed   By: Jasmine Pang M.D.   On: 11/10/2017 18:44    Scheduled Meds: . aspirin EC  81 mg Oral Daily  . atorvastatin  80 mg Oral q1800  . carvedilol  3.125 mg Oral BID  . dextromethorphan-guaiFENesin  1 tablet Oral BID  . enoxaparin (LOVENOX) injection  40 mg Subcutaneous Q24H  . furosemide  40 mg Oral Daily  . ivabradine  5 mg Oral BID WC  . mometasone-formoterol  2 puff Inhalation BID    Continuous Infusions:   LOS: 2 days     Briant Cedar, MD [facility]   If 7PM-7AM, please contact  night-coverage [email or web address] Password [facility] 11/11/2017, 3:31 PM

## 2017-11-12 LAB — CBC WITH DIFFERENTIAL/PLATELET
BASOS PCT: 0 %
Basophils Absolute: 0 10*3/uL (ref 0.0–0.1)
EOS ABS: 0 10*3/uL (ref 0.0–0.7)
Eosinophils Relative: 0 %
HEMATOCRIT: 37.9 % — AB (ref 39.0–52.0)
Hemoglobin: 12.5 g/dL — ABNORMAL LOW (ref 13.0–17.0)
LYMPHS PCT: 33 %
Lymphs Abs: 2.6 10*3/uL (ref 0.7–4.0)
MCH: 25.4 pg — ABNORMAL LOW (ref 26.0–34.0)
MCHC: 33 g/dL (ref 30.0–36.0)
MCV: 76.9 fL — AB (ref 78.0–100.0)
Monocytes Absolute: 0.8 10*3/uL (ref 0.1–1.0)
Monocytes Relative: 10 %
NEUTROS PCT: 57 %
Neutro Abs: 4.5 10*3/uL (ref 1.7–7.7)
Platelets: 219 10*3/uL (ref 150–400)
RBC: 4.93 MIL/uL (ref 4.22–5.81)
RDW: 14.4 % (ref 11.5–15.5)
WBC: 7.9 10*3/uL (ref 4.0–10.5)

## 2017-11-12 LAB — BASIC METABOLIC PANEL
ANION GAP: 10 (ref 5–15)
BUN: 45 mg/dL — ABNORMAL HIGH (ref 6–20)
CHLORIDE: 104 mmol/L (ref 101–111)
CO2: 25 mmol/L (ref 22–32)
Calcium: 9.1 mg/dL (ref 8.9–10.3)
Creatinine, Ser: 2.28 mg/dL — ABNORMAL HIGH (ref 0.61–1.24)
GFR calc non Af Amer: 33 mL/min — ABNORMAL LOW (ref >60)
GFR, EST AFRICAN AMERICAN: 39 mL/min — AB (ref >60)
Glucose, Bld: 95 mg/dL (ref 65–99)
Potassium: 5.2 mmol/L — ABNORMAL HIGH (ref 3.5–5.1)
SODIUM: 139 mmol/L (ref 135–145)

## 2017-11-12 MED ORDER — FUROSEMIDE 80 MG PO TABS
80.0000 mg | ORAL_TABLET | Freq: Two times a day (BID) | ORAL | Status: DC
  Administered 2017-11-13 – 2017-11-14 (×4): 80 mg via ORAL
  Filled 2017-11-12 (×4): qty 1

## 2017-11-12 NOTE — Evaluation (Signed)
Physical Therapy Evaluation Patient Details Name: Mark [name] MRN: [id number] DOB: 10-21-1975 Today's Date: 11/12/2017   History of Present Illness  Mark Ryel Hayesis a 42 y.o.malewithhistory of nonischemic cardiomyopathy, asthma, recent stroke in February 2019, presents to the ER because of increasing SOB which started 1 day PTA, reported difficulty walking from his bathroom to the room. Pt also had some chest tightness on exertion, productive cough with clear sputum and increasing lower extremity edema. Working diagnosis of CHF  Clinical Impression  Patient evaluated by Physical Therapy with no further acute PT needs identified. All education has been completed and the patient has no further questions. Walked overall well; good self-monitor for activity tolerance; Noted short life expectancy in Dr. Lindaann Slough note; consider consulting Palliative Care Team;  See below for any follow-up Physical Therapy or equipment needs. PT is signing off. Thank you for this referral.      Follow Up Recommendations No PT follow up;Other (comment)(Consider [facility] for chronic disease management) Consider Palliative Care Consult   Equipment Recommendations  None recommended by PT    Recommendations for Other Services       Precautions / Restrictions Precautions Precautions: Other (comment) Precaution Comments: Contact Isolation      Mobility  Bed Mobility Overal bed mobility: Independent                Transfers Overall transfer level: Independent                  Ambulation/Gait Ambulation/Gait assistance: Supervision;Independent Ambulation Distance (Feet): 400 Feet Assistive device: None Gait Pattern/deviations: Step-through pattern;WFL(Within Functional Limits)     General Gait Details: Initial cues to self-monitor for activity tolerance; Overall managing well hallway amb  Stairs            Wheelchair Mobility    Modified Rankin (Stroke Patients Only)        Balance                                             Pertinent Vitals/Pain Pain Assessment: No/denies pain    Home Living Family/patient expects to be discharged to:: Private residence Living Arrangements: Non-relatives/Friends Available Help at Discharge: Family;Friend(s);Available PRN/intermittently Type of Home: House Home Access: Stairs to enter Entrance Stairs-Rails: None Entrance Stairs-Number of Steps: 2 Home Layout: One level        Prior Function Level of Independence: Independent         Comments: Previously worked at [employer]; currently tells me he does not work     Higher education careers adviser        Extremity/Trunk Assessment   Upper Extremity Assessment Upper Extremity Assessment: Overall WFL for tasks assessed    Lower Extremity Assessment Lower Extremity Assessment: Overall WFL for tasks assessed       Communication   Communication: No difficulties  Cognition Arousal/Alertness: Awake/alert Behavior During Therapy: WFL for tasks assessed/performed;Flat affect Overall Cognitive Status: Within Functional Limits for tasks assessed                                        General Comments General comments (skin integrity, edema, etc.): Walked on Room Air and O2 sats remained greater than or equal to 94% (when it dropped low it was a poor pleth wave)    Exercises  Assessment/Plan    PT Assessment Patent does not need any further PT services  PT Problem List         PT Treatment Interventions      PT Goals (Current goals can be found in the Care Plan section)  Acute Rehab PT Goals Patient Stated Goal: get better PT Goal Formulation: All assessment and education complete, DC therapy    Frequency     Barriers to discharge        Co-evaluation               AM-PAC PT "6 Clicks" Daily Activity  Outcome Measure Difficulty turning over in bed (including adjusting bedclothes, sheets and blankets)?:  None Difficulty moving from lying on back to sitting on the side of the bed? : None Difficulty sitting down on and standing up from a chair with arms (e.g., wheelchair, bedside commode, etc,.)?: None Help needed moving to and from a bed to chair (including a wheelchair)?: None Help needed walking in hospital room?: None Help needed climbing 3-5 steps with a railing? : None 6 Click Score: 24    End of Session Equipment Utilized During Treatment: Other (comment)(vitals machine) Activity Tolerance: Patient tolerated treatment well(mildly winded) Patient left: in bed;Other (comment)(managing independently in the room) Nurse Communication: Mobility status PT Visit Diagnosis: Difficulty in walking, not elsewhere classified (R26.2)    Time: 1610-9604 PT Time Calculation (min) (ACUTE ONLY): 22 min   Charges:   PT Evaluation $PT Eval Low Complexity: 1 Low     PT G Codes:        Van Clines, PT  Acute Rehabilitation Services Pager [phone] Office [phone]   Levi Aland 11/12/2017, 12:07 PM

## 2017-11-12 NOTE — Progress Notes (Signed)
PROGRESS NOTE  Mark [name] MGN:[id number] DOB: 09-26-1975 DOA: 11/09/2017 PCP: Massie Maroon, FNP  HPI/Recap of past 24 hours: Mark [name] is a 42 y.o. male with history of nonischemic cardiomyopathy, asthma, recent stroke in February 2019, presents to the ER because of increasing SOB which started 1 day PTA, reported difficulty walking from his bathroom to the room. Pt also had some chest tightness on exertion, productive cough with clear sputum and increasing lower extremity edema. Patient reports compliance with his Lasix. In the ER, CXR was clear. BNP is markedly elevated at 3000. Troponin is mildly elevated, EKG shows normal sinus rhythm with nonspecific ST-T changes.  Patient was given Lasix 40 mg IV and admitted for further management.  Today, pt still short of breath, denies any further chest pain, abdominal pain.  Still coughing, not in acute distress, not requiring oxygen.  Very tired and sleepy, overall deconditioned.  Expresses frustration about medical condition.  Had an extensive conversation with patient about overall medical condition, heart failure, the importance of compliance to medications and appointments.  Completely refused to get MRSA swab or flu swab, reported it gave him infection in his nose  11/12/2017: Patient seen.  No new complaints.  Patient is improving slowly.  Will increase Lasix to 80 mg p.o. twice daily.  We will continue to monitor renal function and electrolytes.   Assessment/Plan: Principal Problem:   Acute systolic CHF (congestive heart failure) (HCC) Active Problems:   Asthma   Elevated troponin I level   Acute on chronic systolic CHF (congestive heart failure) (HCC)   NICM (nonischemic cardiomyopathy) (HCC)   CKD (chronic kidney disease) stage 2, GFR 60-89 ml/min   Chronic anemia   Noncompliance  Acute on chronic systolic heart failure/NICM Worsening shortness of breath, overall deconditioned, ??compliance to meds BNP >3000, doubled  from previous admission Chest x-ray unremarkable, no congestions, no consolidation Echo in February 2019 showed EF of 10-15% which has been gradually worsening over the years Cardiac cath in 2017 unremarkable Continue PO Lasix, coreg, cardiology started patient on ivabradine due to tachycardia Strict I's and O's, daily weights Cardiology on board  Elevated troponin Flat, trending downwards, no chest pain EKG no acute ST changes Likely due to above Cardiology consulted  Elevated d-dimer VQ scan, low probability for PE  AKI on CKD stage III Baseline around 1.3-1.4, currently 2.33 Monitor closely, avoid nephrotoxin Daily BMP  History of CVA in 2/19 Currently no focal neurologic deficit Continue aspirin and statin  History of asthma Stable Continue duo nebs, inhalers  Polysubstance abuse History of cocaine and alcohol abuse Denies any recent use since his stroke in February UDS negative  Low TSH Repeat once clinically stable as an outpatient   Code Status: Full  Family Communication: None at bedside  Disposition Plan: Pending clinical improvement   Consultants:  Cardiology   Procedures:  None  Antimicrobials:  None  DVT prophylaxis: Lovenox   Objective: Vitals:   11/11/17 2043 11/11/17 2126 11/12/17 0300 11/12/17 0539  BP:  109/72 97/69 97/77   Pulse:  87 83 84  Resp:  18  18  Temp:  98 F (36.7 C)  98 F (36.7 C)  TempSrc:  Oral  Oral  SpO2: 95% 96%  100%  Weight:    72.6 kg (160 lb 1.6 oz)  Height:        Intake/Output Summary (Last 24 hours) at 11/12/2017 1357 Last data filed at 11/12/2017 0700 Gross per 24 hour  Intake 340 ml  Output 500 ml  Net -160 ml   Filed Weights   11/10/17 0529 11/11/17 0614 11/12/17 0539  Weight: 73.6 kg (162 lb 3.2 oz) 72.5 kg (159 lb 14.4 oz) 72.6 kg (160 lb 1.6 oz)    Exam:   General: NAD, ill looking, deconditioned  Cardiovascular: S1, S2, JVD elevated  Respiratory: Diminished breath sounds  bilaterally  Abdomen: Soft, nontender, nondistended, bowel sounds present  Musculoskeletal: Bilateral lower extremity edema  Skin: Normal  Psychiatry: Normal mood   Data Reviewed: CBC: Recent Labs  Lab 11/09/17 1812 11/09/17 2349 11/10/17 0523 11/11/17 0233 11/12/17 0731  WBC 6.3 6.7 7.4 11.2* 7.9  NEUTROABS 3.4  --   --  9.1* 4.5  HGB 12.5* 11.8* 13.2 12.6* 12.5*  HCT 38.3* 36.9* 41.8 39.0 37.9*  MCV 77.7* 78.8 78.9 77.1* 76.9*  PLT 226 221 226 233 219   Basic Metabolic Panel: Recent Labs  Lab 11/09/17 1812 11/09/17 2349 11/10/17 0523 11/11/17 0233 11/12/17 0731  NA 138  --  138 138 139  K 4.6  --  5.9* 5.9* 5.2*  CL 103  --  101 99* 104  CO2 22  --  22 22 25   GLUCOSE 94  --  77 63* 95  BUN 20  --  26* 39* 45*  CREATININE 1.43* 1.67* 1.91* 2.33* 2.28*  CALCIUM 9.2  --  9.4 9.3 9.1  MG  --  1.8  --   --   --    GFR: Estimated Creatinine Clearance: 41.1 mL/min (A) (by C-G formula based on SCr of 2.28 mg/dL (H)). Liver Function Tests: Recent Labs  Lab 11/09/17 1812 11/10/17 0523  AST 54* 106*  ALT 35 54  ALKPHOS 100 116  BILITOT 2.6* 3.2*  PROT 6.7 7.2  ALBUMIN 3.2* 3.2*   Recent Labs  Lab 11/09/17 1812  LIPASE 30   No results for input(s): AMMONIA in the last 168 hours. Coagulation Profile: No results for input(s): INR, PROTIME in the last 168 hours. Cardiac Enzymes: Recent Labs  Lab 11/09/17 1812 11/09/17 2349 11/10/17 0523 11/10/17 1004  TROPONINI 0.19* 0.15* 0.12* 0.09*   BNP (last 3 results) No results for input(s): PROBNP in the last 8760 hours. HbA1C: No results for input(s): HGBA1C in the last 72 hours. CBG: No results for input(s): GLUCAP in the last 168 hours. Lipid Profile: No results for input(s): CHOL, HDL, LDLCALC, TRIG, CHOLHDL, LDLDIRECT in the last 72 hours. Thyroid Function Tests: Recent Labs    11/09/17 2349  TSH 0.289*   Anemia Panel: Recent Labs    11/10/17 0523  VITAMINB12 961*  FOLATE 17.7  FERRITIN  150  TIBC 372  IRON 180  RETICCTPCT 1.4   Urine analysis:    Component Value Date/Time   COLORURINE AMBER (A) 11/11/2017 1615   APPEARANCEUR CLEAR 11/11/2017 1615   LABSPEC 1.020 11/11/2017 1615   PHURINE 5.0 11/11/2017 1615   GLUCOSEU NEGATIVE 11/11/2017 1615   HGBUR NEGATIVE 11/11/2017 1615   BILIRUBINUR NEGATIVE 11/11/2017 1615   KETONESUR NEGATIVE 11/11/2017 1615   PROTEINUR NEGATIVE 11/11/2017 1615   NITRITE NEGATIVE 11/11/2017 1615   LEUKOCYTESUR NEGATIVE 11/11/2017 1615   Sepsis Labs: @LABRCNTIP (procalcitonin:4,lacticidven:4)  )No results found for this or any previous visit (from the past 240 hour(s)).    Studies: No results found.  Scheduled Meds: . aspirin EC  81 mg Oral Daily  . atorvastatin  80 mg Oral q1800  . carvedilol  3.125 mg Oral BID  . dextromethorphan-guaiFENesin  1 tablet Oral BID  .  enoxaparin (LOVENOX) injection  40 mg Subcutaneous Q24H  . furosemide  40 mg Oral Daily  . ivabradine  5 mg Oral BID WC  . mometasone-formoterol  2 puff Inhalation BID    Continuous Infusions:   LOS: 3 days     Barnetta Chapel, MD [facility]   If 7PM-7AM, please contact night-coverage [email or web address] Password [facility] 11/12/2017, 1:57 PM

## 2017-11-12 NOTE — Progress Notes (Signed)
Pt called RN to the room having SOB, oxygen saturation 95% RA, but RN gave oxygen @2l /min via [place] just for the comfort  Overall pt ambulated with PT in a hallway, vitals stable, denies chest pain  Lonia Farber, RN

## 2017-11-12 NOTE — Progress Notes (Signed)
Pt had 13 bt run of V-tach, no s/s, K was 5.9, yesterday, has Bmet in am, MD notified, will continue to monitor, Thanks Lavonda Jumbo RN.

## 2017-11-12 NOTE — Progress Notes (Addendum)
Progress Note  Patient Name: Mark [name] Date of Encounter: 11/12/2017  Primary Cardiologist: Verne Carrow, MD   Subjective   The patient states he feels very weak, gets tired with minimal activities.   Inpatient Medications    Scheduled Meds: . aspirin EC  81 mg Oral Daily  . atorvastatin  80 mg Oral q1800  . carvedilol  3.125 mg Oral BID  . dextromethorphan-guaiFENesin  1 tablet Oral BID  . enoxaparin (LOVENOX) injection  40 mg Subcutaneous Q24H  . furosemide  40 mg Oral Daily  . ivabradine  5 mg Oral BID WC  . mometasone-formoterol  2 puff Inhalation BID   Continuous Infusions:  PRN Meds: acetaminophen **OR** acetaminophen, albuterol, ondansetron **OR** ondansetron (ZOFRAN) IV   Vital Signs    Vitals:   11/11/17 2043 11/11/17 2126 11/12/17 0300 11/12/17 0539  BP:  109/72 97/69 97/77   Pulse:  87 83 84  Resp:  18  18  Temp:  98 F (36.7 C)  98 F (36.7 C)  TempSrc:  Oral  Oral  SpO2: 95% 96%  100%  Weight:    160 lb 1.6 oz (72.6 kg)  Height:        Intake/Output Summary (Last 24 hours) at 11/12/2017 1103 Last data filed at 11/12/2017 0700 Gross per 24 hour  Intake 460 ml  Output 700 ml  Net -240 ml   Filed Weights   11/10/17 0529 11/11/17 0614 11/12/17 0539  Weight: 162 lb 3.2 oz (73.6 kg) 159 lb 14.4 oz (72.5 kg) 160 lb 1.6 oz (72.6 kg)   Telemetry    SR - Personally Reviewed  ECG    SR - Personally Reviewed  Physical Exam   GEN: No acute distress.   Neck: JVD + 8 cm Cardiac: RRR, no murmurs, rubs, or gallops.  Respiratory: Mild crackles bilaterally. GI: Soft, nontender, non-distended  MS: No edema; No deformity. Neuro:  Nonfocal  Psych: Normal affect   Labs    Chemistry Recent Labs  Lab 11/09/17 1812  11/10/17 0523 11/11/17 0233 11/12/17 0731  NA 138  --  138 138 139  K 4.6  --  5.9* 5.9* 5.2*  CL 103  --  101 99* 104  CO2 22  --  22 22 25   GLUCOSE 94  --  77 63* 95  BUN 20  --  26* 39* 45*  CREATININE 1.43*   <  > 1.91* 2.33* 2.28*  CALCIUM 9.2  --  9.4 9.3 9.1  PROT 6.7  --  7.2  --   --   ALBUMIN 3.2*  --  3.2*  --   --   AST 54*  --  106*  --   --   ALT 35  --  54  --   --   ALKPHOS 100  --  116  --   --   BILITOT 2.6*  --  3.2*  --   --   GFRNONAA 59*   < > 41* 33* 33*  GFRAA >60   < > 48* 38* 39*  ANIONGAP 13  --  15 17* 10   < > = values in this interval not displayed.    Hematology Recent Labs  Lab 11/10/17 0523 11/11/17 0233 11/12/17 0731  WBC 7.4 11.2* 7.9  RBC 5.30  5.30 5.06 4.93  HGB 13.2 12.6* 12.5*  HCT 41.8 39.0 37.9*  MCV 78.9 77.1* 76.9*  MCH 24.9* 24.9* 25.4*  MCHC 31.6 32.3 33.0  RDW 15.2  14.7 14.4  PLT 226 233 219   Cardiac Enzymes Recent Labs  Lab 11/09/17 1812 11/09/17 2349 11/10/17 0523 11/10/17 1004  TROPONINI 0.19* 0.15* 0.12* 0.09*   No results for input(s): TROPIPOC in the last 168 hours.   BNP Recent Labs  Lab 11/09/17 1812  BNP 3,047.5*    DDimer  Recent Labs  Lab 11/10/17 0523  DDIMER 5.11*    Radiology    Nm Pulmonary Perf And Vent  Result Date: 11/10/2017 CLINICAL DATA:  Increasing shortness of breath EXAM: NUCLEAR MEDICINE VENTILATION - PERFUSION LUNG SCAN TECHNIQUE: Ventilation images were obtained in multiple projections using inhaled aerosol Tc-20m DTPA. Perfusion images were obtained in multiple projections after intravenous injection of Tc-40m-MAA. RADIOPHARMACEUTICALS:  32.7 mCi of Tc-60m DTPA aerosol inhalation and 4.3 mCi Tc68m-MAA IV COMPARISON:  Chest x-ray 11/09/2017, CT chest 10/23/2017 FINDINGS: Ventilation: Markedly heterogeneous ventilation with multiple bilateral defects in addition to central airways clumping of activity. Perfusion: Markedly heterogeneous with multiple bilateral defects. IMPRESSION: 1. Markedly heterogeneous perfusion and ventilation with multiple bilateral matched defects and no corresponding opacity on x-ray. Overall low probability for pulmonary embolus; constellation of findings consistent with  obstructive airways disease. Electronically Signed   By: Jasmine Pang M.D.   On: 11/10/2017 18:44   Cardiac Studies      Patient Profile     42 y.o. male   Assessment & Plan    1. Nonischemic cardiomyopathy, acute on chronic systolic heart failure, EF 10-15% - he was started on 40 mg IV lasix BID, - his home dose appears to be 40 mg lasix daily, he reports compliance - he is not very volume overloaded, switched to PO dosing yesterday, stable weight, elevated Crea - weight on admission was 166 lbs, now 160 lbs - difficult to determine what his dry weight is given his medical noncompliance and multiple ER visits - BNP > 3000 with sCr 1.43 - continue coreg, holding hydralazine and nitrates for marginal pressures - these are not listed in his home medications - unclear what exactly he was taking at home - he had an episode of nsVT - 14 beats the last night, no room for uptitrating carvedilol as he is hypotensive, he is not a candidate for a lifevest of ICD implantation as his life expectancy is < 6 months.   2. Chest pain - cardiac catheterization in 2017 reassuring that CAD is unlikely obstructive at this point - D-dimer is elevated - tropnin mildly elevated, hx of chronically mildly elevated troponin; EKG without signs of acute ischemia - CTA on 10/23/17 negative for PE - may benefit from a VQ scan given renal function  3. Polysubstance abuse - UDA was negative this admission  Impression: This is end stage heart failure with low flow state, h/o polysubstance abuse, CVA, leaving hospital AMA, medication non-compliance and no shows in the clinic. He is not a candidate for inotrope infusion or any other advanced HF therapies.  A hospice should be consulted to help with his future management. This was discussed with Dr [name] from CHF team.   For questions or updates, please contact [facility] Please consult [email or web address] for contact info under  Cardiology/STEMI.  Signed, Tobias Alexander, MD  11/12/2017, 11:03 AM

## 2017-11-13 LAB — RENAL FUNCTION PANEL
Albumin: 3 g/dL — ABNORMAL LOW (ref 3.5–5.0)
Anion gap: 13 (ref 5–15)
BUN: 52 mg/dL — ABNORMAL HIGH (ref 6–20)
CO2: 24 mmol/L (ref 22–32)
Calcium: 9 mg/dL (ref 8.9–10.3)
Chloride: 99 mmol/L — ABNORMAL LOW (ref 101–111)
Creatinine, Ser: 2.27 mg/dL — ABNORMAL HIGH (ref 0.61–1.24)
GFR calc Af Amer: 39 mL/min — ABNORMAL LOW (ref >60)
GFR calc non Af Amer: 34 mL/min — ABNORMAL LOW (ref >60)
Glucose, Bld: 99 mg/dL (ref 65–99)
Phosphorus: 3.7 mg/dL (ref 2.5–4.6)
Potassium: 4.9 mmol/L (ref 3.5–5.1)
Sodium: 136 mmol/L (ref 135–145)

## 2017-11-13 LAB — CBC WITH DIFFERENTIAL/PLATELET
BASOS ABS: 0 10*3/uL (ref 0.0–0.1)
BLASTS: 0 %
Band Neutrophils: 8 %
Basophils Relative: 0 %
EOS ABS: 0 10*3/uL (ref 0.0–0.7)
Eosinophils Relative: 0 %
HEMATOCRIT: 38.7 % — AB (ref 39.0–52.0)
Hemoglobin: 12.7 g/dL — ABNORMAL LOW (ref 13.0–17.0)
Lymphocytes Relative: 25 %
Lymphs Abs: 1.8 10*3/uL (ref 0.7–4.0)
MCH: 25.2 pg — ABNORMAL LOW (ref 26.0–34.0)
MCHC: 32.8 g/dL (ref 30.0–36.0)
MCV: 76.8 fL — AB (ref 78.0–100.0)
METAMYELOCYTES PCT: 1 %
MYELOCYTES: 0 %
Monocytes Absolute: 1 10*3/uL (ref 0.1–1.0)
Monocytes Relative: 14 %
Neutro Abs: 4.5 10*3/uL (ref 1.7–7.7)
Neutrophils Relative %: 52 %
Other: 0 %
PROMYELOCYTES ABS: 0 %
Platelets: 182 10*3/uL (ref 150–400)
RBC: 5.04 MIL/uL (ref 4.22–5.81)
RDW: 14.6 % (ref 11.5–15.5)
WBC: 7.3 10*3/uL (ref 4.0–10.5)
nRBC: 3 /100 WBC — ABNORMAL HIGH

## 2017-11-13 MED ORDER — HYOSCYAMINE SULFATE 0.125 MG SL SUBL
0.2500 mg | SUBLINGUAL_TABLET | Freq: Once | SUBLINGUAL | Status: AC
  Administered 2017-11-13: 0.25 mg via SUBLINGUAL
  Filled 2017-11-13: qty 2

## 2017-11-13 NOTE — Progress Notes (Signed)
Heart Failure Navigator Consult Note  Presentation: Per Mark Flesher PA Mark [name] is a 42 y.o. 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 Sharolyn Douglas.  Mark [name] was admitted 1/30-2/1 with decompensated heart failure. He was started on hydralazine, nitrates, and nonselective beta blocker in the setting of cocaine use. He was admitted 2/7-2/11 with acute right MCA infarct. He presented in respiratory failure, placed on BiPAP and was positive for cocaine. Pt was felt to not be an anticoagulation candidate at that time and was treated with ASA. Pt left AMA. Pt was seen in [facility] 10/11/17 with shortness of breath and weakness. He saw Mark [name] in clinic on 10/09/17 and had been off of his cardiac medications. He had since restarted his lasix, but was having financial difficulty getting the rest of his medications. Pt was discharged from the ER. He was seen in the ER again on 10/23/17 with SOB. He reported compliance on lasix at that time. He was treated for bronchitis with steroids with instructions to follow up with cardiology and PCP.  He presented to [facility] 11/09/17 with shortness of breath despite using his inhalers and taking his lasix. He reports cough productive of sputum and blood. He also reports chest pain.   On my interview, he describes onset of shortness of breath yesterday when using the bathroom. He could barely make it back to the couch. He also reports productive cough of green phlegm with blood. He called EMS and was admitted to IM. He received IV lasix and felt a little better. On my exam, he is found sleeping flat on his side on room air. He reports SOB and chronic orthopnea. He reports feeling better since his last discharge from the ED until yesterday. He says he has been  compliant on his medications. He says he doesn't understand his heart problems.   Of note, he missed an appt with the CHF clinic in Feb 2019.    Past Medical History:  Diagnosis Date  . Asthma   . Chronic systolic CHF (congestive heart failure) (HCC)   . Cigarette smoker   . CKD (chronic kidney disease), stage II    Mark [name] 10/01/2017  . History of echocardiogram    a. Echo 5/17 - EF 20-25%, severe diff HK, restrictive physiology, mild to mod MR, severe reduced RVSF, mod RVE, mild RAE, mod TR, PASP 48 mmHg  . Hx of cardiac cath    a. LHC 5/17 - normal coronary arteries. PA 45/25, mean 33, PCWP mean 18  . NICM (nonischemic cardiomyopathy) (HCC)   . Stroke (HCC) 09/27/2017   "was weak on my left side; I'm fully recovered" (11/09/2017)  . Substance abuse (HCC)    cocaine, marijuana    Social History   Socioeconomic History  . Marital status: Divorced    Spouse name: Not on file  . Number of children: Not on file  . Years of education: Not on file  . Highest education level: Not on file  Occupational History  . Not on file  Social Needs  . Financial resource strain: Not on file  . Food insecurity:    Worry: Not on file    Inability: Not on file  . Transportation needs:    Medical: Not on file    Non-medical: Not on file  Tobacco  Use  . Smoking status: Former Smoker    Packs/day: 1.00    Years: 30.00    Pack years: 30.00    Types: Cigarettes    Last attempt to quit: 09/27/2017    Years since quitting: 0.1  . Smokeless tobacco: Never Used  Substance and Sexual Activity  . Alcohol use: Yes    Alcohol/week: 3.0 oz    Types: 5 Shots of liquor per week    Frequency: Never    Comment: 5-6 shots of vodka daily; "stopped it all after I had stroke 09/27/2017"  . Drug use: Yes    Types: Cocaine, Marijuana    Comment: 11/09/2017 "none since 09/27/2017"  . Sexual activity: Not Currently  Lifestyle  . Physical activity:    Days per week: Not on file    Minutes per session: Not on  file  . Stress: Not on file  Relationships  . Social connections:    Talks on phone: Not on file    Gets together: Not on file    Attends religious service: Not on file    Active member of club or organization: Not on file    Attends meetings of clubs or organizations: Not on file    Relationship status: Not on file  Other Topics Concern  . Not on file  Social History Narrative  . Not on file    ECHO:Study Conclusions--09/28/17  - Left ventricle: The cavity size was normal. Systolic function was   normal. The estimated ejection fraction was in the range of 10%   to 15%. Severe diffuse hypokinesis. - Mitral valve: There was moderate to severe regurgitation. - Right ventricle: The cavity size was mildly dilated. Wall   thickness was normal. Systolic function was mildly reduced. - Right atrium: The atrium was mildly to moderately dilated. - Pulmonary arteries: Systolic pressure was moderately increased.   PA peak pressure: 56 mm Hg (S).  ------------------------------------------------------------------- Study data:  Comparison was made to the study of 09/21/2017.  Study status:  Routine.  Procedure:  Patient had difficulty properly positioning at times which could have affected the quality of images acquired during the exam. The patient reported no pain pre or post test. Transthoracic echocardiography. Image quality was adequate. The study was technically difficult, as a result of restricted patient mobility.  Study completion:  There were no complications.          Transthoracic echocardiography.  M-mode, complete 2D, spectral Doppler, and color Doppler.  Birthdate: Patient birthdate: [birth year].  Age:  Patient is 42 yr old.  Sex: Gender: male.    BMI: 25.2 kg/m^2.  Blood pressure:     120/87 Patient status:  Inpatient.  Study date:  Study date: 09/28/2017. Study time: 12:17 PM.  Location:  Emergency department.  BNP    Component Value Date/Time   BNP 3,047.5 (H)  11/09/2017 1812    ProBNP No results found for: PROBNP   Education Assessment and Provision:  Detailed education and instructions provided on heart failure disease management including the following:  Signs and symptoms of Heart Failure When to call the physician Importance of daily weights Low sodium diet Fluid restriction Medication management Anticipated future follow-up appointments  Patient education given on each of the above topics.  Patient acknowledges understanding and acceptance of all instructions.  I spoke with Mr. Burningham regarding his HF.  He tells me that he has been told in the past that he had "a weak heart".  He does not have a scale and  I will provide one for daily use.  I discussed the importance of daily weights and when to contact the physician.   I reviewed a low sodium diet and high sodium foods to avoid.  He was shocked that V-8 juice was not considered "healthy" for him.  I explained it was high in sodium. He says that he has trouble affording medications due to no insurance.  He tells me that he had Medicaid and "got locked up-then it went away".  He does say that he was taking medications at home prior to admission.  He says that he has transportation to and from appts.  He will likely follow with [facility] at discharge.  HF Team has recommended Hospice Consult.  Education Materials:  "Living Better With Heart Failure" Booklet, Daily Weight Tracker Tool  High Risk Criteria for Readmission and/or Poor Patient Outcomes:   EF <30%- Yes 10-15%  2 or more admissions in 6 months- yes-4/55mo  Difficult social situation- yes- no insurance noted  Demonstrates medication noncompliance- yes admits difficulty getting prescribed medications at times    Barriers of Care:  Substance abuse, Health Literacy, Insight, Financial, Knowledge and Compliance.  Discharge Planning:   He plans to return to home in [place] with a "friend".

## 2017-11-13 NOTE — Progress Notes (Signed)
PROGRESS NOTE  Mark [name]:[id number] DOB: 1976-08-16 DOA: 11/09/2017 PCP: Massie Maroon, FNP  HPI/Recap of past 24 hours: Mark [name] is a 42 y.o. male with history of nonischemic cardiomyopathy, asthma, recent stroke in February 2019, presents to the ER because of increasing SOB which started 1 day PTA, reported difficulty walking from his bathroom to the room. Pt also had some chest tightness on exertion, productive cough with clear sputum and increasing lower extremity edema. Patient reports compliance with his Lasix. In the ER, CXR was clear. BNP is markedly elevated at 3000. Troponin is mildly elevated, EKG shows normal sinus rhythm with nonspecific ST-T changes.  Patient was given Lasix 40 mg IV and admitted for further management.  Today, pt still short of breath, denies any further chest pain, abdominal pain.  Still coughing, not in acute distress, not requiring oxygen.  Very tired and sleepy, overall deconditioned.  Expresses frustration about medical condition.  Had an extensive conversation with patient about overall medical condition, heart failure, the importance of compliance to medications and appointments.  Completely refused to get MRSA swab or flu swab, reported it gave him infection in his nose  11/13/2017: Patient seen.  No new complaints.  Patient is improving slowly.  Lasix increased to 80 mg p.o. twice daily.  Will monitor patient's renal function tomorrow.  Possible discharge back home in the morning if the renal function remains stable.  Assessment/Plan: Principal Problem:   Acute systolic CHF (congestive heart failure) (HCC) Active Problems:   Asthma   Elevated troponin I level   Acute on chronic systolic CHF (congestive heart failure) (HCC)   NICM (nonischemic cardiomyopathy) (HCC)   CKD (chronic kidney disease) stage 2, GFR 60-89 ml/min   Chronic anemia   Noncompliance  Acute on chronic systolic heart failure/NICM Worsening shortness of breath,  overall deconditioned, ??compliance to meds BNP >3000, doubled from previous admission Chest x-ray unremarkable, no congestions, no consolidation Echo in February 2019 showed EF of 10-15% which has been gradually worsening over the years Cardiac cath in 2017 unremarkable Continue PO Lasix, coreg, cardiology started patient on ivabradine due to tachycardia Strict I's and O's, daily weights Cardiology on board  Elevated troponin Flat, trending downwards, no chest pain EKG no acute ST changes Likely due to above Cardiology consulted  Elevated d-dimer VQ scan, low probability for PE  AKI on CKD stage III Baseline around 1.3-1.4, currently 2.33 Monitor closely, avoid nephrotoxin Daily BMP  History of CVA in 2/19 Currently no focal neurologic deficit Continue aspirin and statin  History of asthma Stable Continue duo nebs, inhalers  Polysubstance abuse History of cocaine and alcohol abuse Denies any recent use since his stroke in February UDS negative  Low TSH Repeat once clinically stable as an outpatient   Code Status: Full  Family Communication: None at bedside  Disposition Plan: Pending clinical improvement   Consultants:  Cardiology   Procedures:  None  Antimicrobials:  None  DVT prophylaxis: Lovenox   Objective: Vitals:   11/12/17 2141 11/13/17 0649 11/13/17 0859 11/13/17 0911  BP: 99/78 112/62  110/82  Pulse: 77 81 72 80  Resp: 18 18 16    Temp: 97.9 F (36.6 C) 98 F (36.7 C)    TempSrc: Oral Oral    SpO2: 98% 96% 98%   Weight:  72.5 kg (159 lb 14.4 oz)    Height:        Intake/Output Summary (Last 24 hours) at 11/13/2017 1240 Last data filed at 11/13/2017  0945 Gross per 24 hour  Intake 800 ml  Output 925 ml  Net -125 ml   Filed Weights   11/11/17 0614 11/12/17 0539 11/13/17 0649  Weight: 72.5 kg (159 lb 14.4 oz) 72.6 kg (160 lb 1.6 oz) 72.5 kg (159 lb 14.4 oz)    Exam:   General: NAD,   Cardiovascular: S1, S2, JVD  elevated  Respiratory: Diminished breath sounds bilaterally  Abdomen: Soft, nontender, nondistended, bowel sounds present  Musculoskeletal: Bilateral lower extremity edema  Skin: Normal  Psychiatry: Normal mood   Data Reviewed: CBC: Recent Labs  Lab 11/09/17 1812 11/09/17 2349 11/10/17 0523 11/11/17 0233 11/12/17 0731 11/13/17 0537  WBC 6.3 6.7 7.4 11.2* 7.9 7.3  NEUTROABS 3.4  --   --  9.1* 4.5 4.5  HGB 12.5* 11.8* 13.2 12.6* 12.5* 12.7*  HCT 38.3* 36.9* 41.8 39.0 37.9* 38.7*  MCV 77.7* 78.8 78.9 77.1* 76.9* 76.8*  PLT 226 221 226 233 219 182   Basic Metabolic Panel: Recent Labs  Lab 11/09/17 1812 11/09/17 2349 11/10/17 0523 11/11/17 0233 11/12/17 0731 11/13/17 0537  NA 138  --  138 138 139 136  K 4.6  --  5.9* 5.9* 5.2* 4.9  CL 103  --  101 99* 104 99*  CO2 22  --  22 22 25 24   GLUCOSE 94  --  77 63* 95 99  BUN 20  --  26* 39* 45* 52*  CREATININE 1.43* 1.67* 1.91* 2.33* 2.28* 2.27*  CALCIUM 9.2  --  9.4 9.3 9.1 9.0  MG  --  1.8  --   --   --   --   PHOS  --   --   --   --   --  3.7   GFR: Estimated Creatinine Clearance: 41.3 mL/min (A) (by C-G formula based on SCr of 2.27 mg/dL (H)). Liver Function Tests: Recent Labs  Lab 11/09/17 1812 11/10/17 0523 11/13/17 0537  AST 54* 106*  --   ALT 35 54  --   ALKPHOS 100 116  --   BILITOT 2.6* 3.2*  --   PROT 6.7 7.2  --   ALBUMIN 3.2* 3.2* 3.0*   Recent Labs  Lab 11/09/17 1812  LIPASE 30   No results for input(s): AMMONIA in the last 168 hours. Coagulation Profile: No results for input(s): INR, PROTIME in the last 168 hours. Cardiac Enzymes: Recent Labs  Lab 11/09/17 1812 11/09/17 2349 11/10/17 0523 11/10/17 1004  TROPONINI 0.19* 0.15* 0.12* 0.09*   BNP (last 3 results) No results for input(s): PROBNP in the last 8760 hours. HbA1C: No results for input(s): HGBA1C in the last 72 hours. CBG: No results for input(s): GLUCAP in the last 168 hours. Lipid Profile: No results for input(s):  CHOL, HDL, LDLCALC, TRIG, CHOLHDL, LDLDIRECT in the last 72 hours. Thyroid Function Tests: No results for input(s): TSH, T4TOTAL, FREET4, T3FREE, THYROIDAB in the last 72 hours. Anemia Panel: No results for input(s): VITAMINB12, FOLATE, FERRITIN, TIBC, IRON, RETICCTPCT in the last 72 hours. Urine analysis:    Component Value Date/Time   COLORURINE AMBER (A) 11/11/2017 1615   APPEARANCEUR CLEAR 11/11/2017 1615   LABSPEC 1.020 11/11/2017 1615   PHURINE 5.0 11/11/2017 1615   GLUCOSEU NEGATIVE 11/11/2017 1615   HGBUR NEGATIVE 11/11/2017 1615   BILIRUBINUR NEGATIVE 11/11/2017 1615   KETONESUR NEGATIVE 11/11/2017 1615   PROTEINUR NEGATIVE 11/11/2017 1615   NITRITE NEGATIVE 11/11/2017 1615   LEUKOCYTESUR NEGATIVE 11/11/2017 1615   Sepsis Labs: @LABRCNTIP (procalcitonin:4,lacticidven:4)  )No  results found for this or any previous visit (from the past 240 hour(s)).    Studies: No results found.  Scheduled Meds: . aspirin EC  81 mg Oral Daily  . atorvastatin  80 mg Oral q1800  . carvedilol  3.125 mg Oral BID  . dextromethorphan-guaiFENesin  1 tablet Oral BID  . enoxaparin (LOVENOX) injection  40 mg Subcutaneous Q24H  . furosemide  80 mg Oral BID  . ivabradine  5 mg Oral BID WC  . mometasone-formoterol  2 puff Inhalation BID    Continuous Infusions:   LOS: 4 days     Barnetta Chapel, MD [facility]   If 7PM-7AM, please contact night-coverage [email or web address] Password [facility] 11/13/2017, 12:40 PM

## 2017-11-13 NOTE — Progress Notes (Signed)
Pt continues to refuse to take some of his meds, he states he only takes medicine 1 x day at home, RN explained that his heart meds are 2 x day and the importance of taking these meds, but despite all this pt still refuses to takes his meds the way they are prescribed, will continue to monitor, Thanks Lavonda Jumbo RN.

## 2017-11-13 NOTE — Progress Notes (Signed)
   No new recommendations Last note reviewed.  Dr. Delton See discussed with advanced CHF team. Recommended hospice care.  Please let us know if we can be of further assistance. Continue to encourage medications (RN notes discuss refusal to take as RX) Signing off.   Donato Schultz, MD

## 2017-11-13 NOTE — Progress Notes (Signed)
Pt states he feels short of breath and felt better when he was wearing a nasal cannula with oxygen.  RT placed pt on 1 L for comfort.

## 2017-11-14 LAB — RENAL FUNCTION PANEL
Albumin: 3.1 g/dL — ABNORMAL LOW (ref 3.5–5.0)
Anion gap: 15 (ref 5–15)
BUN: 54 mg/dL — ABNORMAL HIGH (ref 6–20)
CO2: 21 mmol/L — ABNORMAL LOW (ref 22–32)
Calcium: 9.1 mg/dL (ref 8.9–10.3)
Chloride: 100 mmol/L — ABNORMAL LOW (ref 101–111)
Creatinine, Ser: 2.33 mg/dL — ABNORMAL HIGH (ref 0.61–1.24)
GFR calc Af Amer: 38 mL/min — ABNORMAL LOW (ref >60)
GFR calc non Af Amer: 33 mL/min — ABNORMAL LOW (ref >60)
Glucose, Bld: 73 mg/dL (ref 65–99)
Phosphorus: 3.4 mg/dL (ref 2.5–4.6)
Potassium: 4.8 mmol/L (ref 3.5–5.1)
Sodium: 136 mmol/L (ref 135–145)

## 2017-11-14 LAB — CBC WITH DIFFERENTIAL/PLATELET
BASOS ABS: 0 10*3/uL (ref 0.0–0.1)
BASOS PCT: 0 %
Eosinophils Absolute: 0.1 10*3/uL (ref 0.0–0.7)
Eosinophils Relative: 1 %
HEMATOCRIT: 40.5 % (ref 39.0–52.0)
Hemoglobin: 13.3 g/dL (ref 13.0–17.0)
LYMPHS PCT: 42 %
Lymphs Abs: 3.3 10*3/uL (ref 0.7–4.0)
MCH: 25.1 pg — ABNORMAL LOW (ref 26.0–34.0)
MCHC: 32.8 g/dL (ref 30.0–36.0)
MCV: 76.6 fL — AB (ref 78.0–100.0)
MONO ABS: 1 10*3/uL (ref 0.1–1.0)
Monocytes Relative: 13 %
NEUTROS ABS: 3.5 10*3/uL (ref 1.7–7.7)
NEUTROS PCT: 44 %
Platelets: 122 10*3/uL — ABNORMAL LOW (ref 150–400)
RBC: 5.29 MIL/uL (ref 4.22–5.81)
RDW: 14.9 % (ref 11.5–15.5)
WBC: 7.8 10*3/uL (ref 4.0–10.5)

## 2017-11-14 LAB — MAGNESIUM: Magnesium: 1.9 mg/dL (ref 1.7–2.4)

## 2017-11-14 MED ORDER — IVABRADINE HCL 5 MG PO TABS: 5.0000 mg | ORAL_TABLET | Freq: Two times a day (BID) | ORAL | 0 refills | Status: DC

## 2017-11-14 MED ORDER — ATORVASTATIN CALCIUM 80 MG PO TABS: 80.0000 mg | ORAL_TABLET | Freq: Every day | ORAL | 0 refills | Status: DC

## 2017-11-14 MED ORDER — FUROSEMIDE 80 MG PO TABS: 80.0000 mg | ORAL_TABLET | Freq: Every day | ORAL | 0 refills | Status: DC

## 2017-11-14 NOTE — Discharge Summary (Signed)
Physician Discharge Summary  Patient ID: BIENVENIDO NOVOSEL MRN: [id number] DOB/AGE: [birth year] 42 y.o.  Admit date: 11/09/2017 Discharge date: 11/14/2017  Admission Diagnoses:  Discharge Diagnoses:  Principal Problem:   Acute systolic CHF (congestive heart failure) (HCC) Active Problems:   Asthma   Elevated troponin I level   Acute on chronic systolic CHF (congestive heart failure) (HCC)   NICM (nonischemic cardiomyopathy) (HCC)   AKI on CKD (chronic kidney disease) stage 2, GFR 60-89 ml/min   Chronic anemia   Noncompliance   Discharged Condition: stable  Hospital Course: Patient is a 42 year old African-American male with past medical history significant for nonischemic cardiomyopathy, asthma, and recent stroke in February 2019.  Patient presented with increasing SOB which started 1 day prior to presentation.  The patient was also short of breath that he was difficult to walk from his bedroom to the bathroom.  There was associated increasing lower extremity edema. Patient reported compliance with his Lasix. In the ER.  CXR was clear. BNP was markedly elevated at 3000. Troponin was mildly elevated, EKG showed normal sinus rhythm with nonspecific ST-T changes. Patient was given Lasix 40 mg IV and admitted for further assessment and management.  Acute on chronic systolic heart failure/NICM: Patient was admitted for further assessment and management.  Patient cardiac BNP was noted to have doubled since the last admission.  Cardiac BNP was greater than 3000.  Prior echo is noted, with echo done in February 2019 revealing an EF of 10-15%.  Patient was adequately diuresed.  Lasix was increased to 80 mg p.o. twice daily during the hospital stay.  Worsening renal function is noted.  Patient will be discharged on Lasix 80 mg p.o. once daily.  There will be need to monitor the renal function and electrolytes closely.  Patient's symptoms have improved significantly.    Elevated troponin Flat,  trending downwards, no chest pain EKG no acute ST changes Likely due to above Cardiology consulted  Elevated d-dimer VQ scan, low probability for PE  AKI on CKD stage III Baseline around 1.3-1.4, currently 2.33 Monitor closely, avoid nephrotoxin Daily BMP  History of CVA in 2/19 Currently no focal neurologic deficit Continue aspirin and statin  History of asthma Stable Continue duo nebs, inhalers  Polysubstance abuse History of cocaine and alcohol abuse Denies any recent use since his stroke in February UDS negative  Low TSH Repeat once clinically stable as an outpatient  Significant Diagnostic Studies: labs:   Discharge Exam: Blood pressure 101/72, pulse 69, temperature 98.4 F (36.9 C), temperature source Oral, resp. rate 20, height 5' 8.5" (1.74 m), weight 71.8 kg (158 lb 6.4 oz), SpO2 100 %.   Disposition: Discharge disposition: 01-Home or Self Care       Discharge Instructions    Call MD for:   Complete by:  As directed    Please call MD if the symptoms worsen.   Diet - low sodium heart healthy   Complete by:  As directed    Increase activity slowly   Complete by:  As directed      Allergies as of 11/14/2017      Reactions   Hydrocodone Hives   Lisinopril Swelling, Other (See Comments)   Facial swelling/angioedema   Prednisone Shortness Of Breath, Nausea Only, Swelling, Other (See Comments)   Also made chest feel tight and genital area, legs, and face became swollen badly   Penicillins Hives, Swelling   Has patient had a PCN reaction causing immediate rash, facial/tongue/throat swelling, SOB or  lightheadedness with hypotension: Yes Has patient had a PCN reaction causing severe rash involving mucus membranes or skin necrosis: No Has patient had a PCN reaction that required hospitalization: No Has patient had a PCN reaction occurring within the last 10 years: No If all of the above answers are "NO", then may proceed with Cephalosporin use.       Medication List    STOP taking these medications   levofloxacin 750 MG tablet Commonly known as:  LEVAQUIN   metFORMIN 500 MG tablet Commonly known as:  GLUCOPHAGE     TAKE these medications   albuterol 108 (90 Base) MCG/ACT inhaler Commonly known as:  PROVENTIL HFA;VENTOLIN HFA Inhale 1-2 puffs into the lungs every 4 (four) hours as needed for wheezing or shortness of breath.   aspirin 81 MG EC tablet Take 1 tablet (81 mg total) by mouth daily.   atorvastatin 80 MG tablet Commonly known as:  LIPITOR Take 1 tablet (80 mg total) by mouth daily at 6 PM.   budesonide-formoterol 80-4.5 MCG/ACT inhaler Commonly known as:  SYMBICORT Inhale 2 puffs into the lungs 2 (two) times daily.   carvedilol 3.125 MG tablet Commonly known as:  COREG Take 1 tablet (3.125 mg total) by mouth 2 (two) times daily. What changed:  when to take this   furosemide 80 MG tablet Commonly known as:  LASIX Take 1 tablet (80 mg total) by mouth daily. What changed:    medication strength  how much to take   ivabradine 5 MG Tabs tablet Commonly known as:  CORLANOR Take 1 tablet (5 mg total) by mouth 2 (two) times daily with a meal.   montelukast 10 MG tablet Commonly known as:  SINGULAIR Take 1 tablet (10 mg total) by mouth at bedtime.   multivitamin with minerals Tabs tablet Take 1 tablet by mouth daily.        SignedBarnetta Chapel 11/14/2017, 3:26 PM

## 2017-11-14 NOTE — Progress Notes (Signed)
Pt given dc instructions, pt had different pharmacy than what was in epic, paged Dr to get [facility], reviewed dc instructions and encouraged particpation, pt not engaged with eyes closed and not answering back except ok discussed importance of daily weight, taking medications as ordered, signs to notify md, watching salt and fluid restriciton, pt denies questions

## 2017-11-29 ENCOUNTER — Ambulatory Visit: Payer: Self-pay | Admitting: Family Medicine

## 2017-12-01 ENCOUNTER — Encounter: Payer: Self-pay | Admitting: Family Medicine

## 2017-12-01 ENCOUNTER — Ambulatory Visit (INDEPENDENT_AMBULATORY_CARE_PROVIDER_SITE_OTHER): Payer: Self-pay | Admitting: Family Medicine

## 2017-12-01 VITALS — BP 120/72 | HR 88 | Temp 98.0°F | Resp 12 | Ht 68.5 in | Wt 166.0 lb

## 2017-12-01 DIAGNOSIS — N182 Chronic kidney disease, stage 2 (mild): Secondary | ICD-10-CM

## 2017-12-01 DIAGNOSIS — R7303 Prediabetes: Secondary | ICD-10-CM

## 2017-12-01 DIAGNOSIS — I509 Heart failure, unspecified: Secondary | ICD-10-CM

## 2017-12-01 LAB — POCT URINALYSIS DIP (MANUAL ENTRY)
Bilirubin, UA: NEGATIVE
Glucose, UA: NEGATIVE mg/dL
Ketones, POC UA: NEGATIVE mg/dL
Leukocytes, UA: NEGATIVE
Nitrite, UA: NEGATIVE
PH UA: 7 (ref 5.0–8.0)
Protein Ur, POC: NEGATIVE mg/dL
RBC UA: NEGATIVE
SPEC GRAV UA: 1.015 (ref 1.010–1.025)
UROBILINOGEN UA: 2 U/dL — AB

## 2017-12-01 MED ORDER — IVABRADINE HCL 5 MG PO TABS: 5.0000 mg | ORAL_TABLET | Freq: Two times a day (BID) | ORAL | 0 refills | Status: DC

## 2017-12-01 MED ORDER — FUROSEMIDE 80 MG PO TABS: 40.0000 mg | ORAL_TABLET | Freq: Every day | ORAL | 5 refills | Status: DC

## 2017-12-01 NOTE — Progress Notes (Signed)
Chief Complaint  Patient presents with  . Follow-up    4 weeks on chronic condition   Subjective:    Patient ID: Mark [name], male    DOB: September 06, 1975, 42 y.o.   MRN: [id number]  HPI Mark [name], a 42 year old male with a history of presents for a post hospital follow up.   Mark [name] has a history of CVA, CKD, CHF, cocaine use, and tobacco dependence.   Patient was admitted to inpatient services on 11/09/2017.  Patient was admitted for acute respiratory distress.   Patient is BNP was markedly elevated at 3000 and troponin was mildly elevated.  EKGs showed normal sinus rhythm with nonspecific ST changes.  Patient was treated with IV diuretics.  Patient has chronic heart failure with reduced EF 10-15 % by echocardiogram. Patient was previously evaluated by cardiology.  Patient underwent cardiac catheterization demonstrated normal coronary arteries during inpatient services.  Prior to cardiology visit patient had not been taking medications other than aspirin 81 mg and albuterol inhaler.  Furosemide was increased to 80 mg daily and carvedilol was continued prior to discharge.  Ivabradine was added to medication regimen.  Patient says that he has been unable to pick up ivabradine due to cost constraints.   He currently denies chest pains, heart palpitations, bilateral lower extremity edema, dizziness, or syncope.  Patient endorses fatigue today. Past Medical History:  Diagnosis Date  . Asthma   . Chronic systolic CHF (congestive heart failure) (HCC)   . Cigarette smoker   . CKD (chronic kidney disease), stage II    Mark [name] 10/01/2017  . History of echocardiogram    a. Echo 5/17 - EF 20-25%, severe diff HK, restrictive physiology, mild to mod MR, severe reduced RVSF, mod RVE, mild RAE, mod TR, PASP 48 mmHg  . Hx of cardiac cath    a. LHC 5/17 - normal coronary arteries. PA 45/25, mean 33, PCWP mean 18  . NICM (nonischemic cardiomyopathy) (HCC)   . Stroke (HCC) 09/27/2017   "was weak on my  left side; I'm fully recovered" (11/09/2017)  . Substance abuse (HCC)    cocaine, marijuana   Social History   Socioeconomic History  . Marital status: Divorced    Spouse name: Not on file  . Number of children: Not on file  . Years of education: Not on file  . Highest education level: Not on file  Occupational History  . Not on file  Social Needs  . Financial resource strain: Not on file  . Food insecurity:    Worry: Not on file    Inability: Not on file  . Transportation needs:    Medical: Not on file    Non-medical: Not on file  Tobacco Use  . Smoking status: Former Smoker    Packs/day: 1.00    Years: 30.00    Pack years: 30.00    Types: Cigarettes    Last attempt to quit: 09/27/2017    Years since quitting: 0.1  . Smokeless tobacco: Never Used  Substance and Sexual Activity  . Alcohol use: Yes    Alcohol/week: 3.0 oz    Types: 5 Shots of liquor per week    Frequency: Never    Comment: 5-6 shots of vodka daily; "stopped it all after I had stroke 09/27/2017"  . Drug use: Yes    Types: Cocaine, Marijuana    Comment: 11/09/2017 "none since 09/27/2017"  . Sexual activity: Not Currently  Lifestyle  . Physical activity:    Days  per week: Not on file    Minutes per session: Not on file  . Stress: Not on file  Relationships  . Social connections:    Talks on phone: Not on file    Gets together: Not on file    Attends religious service: Not on file    Active member of club or organization: Not on file    Attends meetings of clubs or organizations: Not on file    Relationship status: Not on file  . Intimate partner violence:    Fear of current or ex partner: Not on file    Emotionally abused: Not on file    Physically abused: Not on file    Forced sexual activity: Not on file  Other Topics Concern  . Not on file  Social History Narrative  . Not on file    There is no immunization history on file for this patient.   Review of Systems  Constitutional: Positive for  fatigue. Negative for fever and unexpected weight change.  HENT: Negative.  Negative for congestion, dental problem, postnasal drip and trouble swallowing.   Eyes: Negative for photophobia and visual disturbance.  Respiratory: Positive for chest tightness and shortness of breath.   Cardiovascular: Negative.  Negative for chest pain.  Gastrointestinal: Negative.  Negative for abdominal distention and abdominal pain.  Endocrine: Negative.  Negative for polydipsia, polyphagia and polyuria.  Genitourinary: Negative.   Musculoskeletal: Negative.   Skin: Negative.   Allergic/Immunologic: Negative for immunocompromised state.  Neurological: Negative.   Hematological: Negative.   Psychiatric/Behavioral: Negative.        Objective:   Physical Exam  Constitutional: He is oriented to person, place, and time. He appears well-developed and well-nourished.  HENT:  Head: Normocephalic and atraumatic.  Right Ear: External ear normal.  Left Ear: External ear normal.  Nose: Nose normal.  Mouth/Throat: Oropharynx is clear and moist.  Eyes: Pupils are equal, round, and reactive to light.  Neck: Normal range of motion. Neck supple.  Cardiovascular: Normal rate, regular rhythm, normal heart sounds and intact distal pulses.  Pulmonary/Chest: Effort normal. He has decreased breath sounds. He has wheezes.  Abdominal: Soft. Bowel sounds are normal.  Musculoskeletal: Normal range of motion.  Neurological: He is alert and oriented to person, place, and time.  Skin: Skin is warm and dry.  Psychiatric: He has a normal mood and affect. His behavior is normal. Judgment and thought content normal.         BP 98/64 (BP Location: Right Arm, Patient Position: Sitting, Cuff Size: Large)   Pulse 88   Temp 98 F (36.7 C) (Oral)   Resp 12   Ht 5' 8.5" (1.74 m)   Wt 166 lb (75.3 kg)   SpO2 96%   BMI 24.87 kg/m   Assessment & Plan:  1. Prediabetes Previous hemoglobin a1c is 6.4. Recommend a carbohydrate  modified diet.  - POCT urinalysis dipstick  2. Heart failure, unspecified HF chronicity, unspecified heart failure type [facility]) Patient scheduled to follow-up with cardiology on 12/07/2017.  Advised to cut furosemide in half over the next several days.  And follow-up for blood pressure check.- Continue medication, monitor blood pressure at home. Continue DASH diet. Reminded to report to the ER if any CP, SOB, nausea, dizziness, severe HA, changes vision/speech, left arm numbness and tingling and jaw pain.    - ivabradine (CORLANOR) 5 MG TABS tablet; Take 1 tablet (5 mg total) by mouth 2 (two) times daily with a meal.  Dispense:  60 tablet; Refill: 0 - furosemide (LASIX) 80 MG tablet; Take 0.5 tablets (40 mg total) by mouth daily.  Dispense: 30 tablet; Refill: 5  3. CKD (chronic kidney disease) stage 2, GFR 60-89 ml/min Previous GFR decreased at 48, will repeat  - Basic Metabolic Panel - CBC  RTC: Patient advised to follow-up with specialist as scheduled.  We will follow-up in 1 week for blood pressure check.   Nolon Nations  MSN, FNP-C [facility] [address] [place], [place] [address] [phone]

## 2017-12-02 LAB — CBC
Hematocrit: 41.1 % (ref 37.5–51.0)
Hemoglobin: 12.8 g/dL — ABNORMAL LOW (ref 13.0–17.7)
MCH: 24.9 pg — AB (ref 26.6–33.0)
MCHC: 31.1 g/dL — AB (ref 31.5–35.7)
MCV: 80 fL (ref 79–97)
PLATELETS: 188 10*3/uL (ref 150–379)
RBC: 5.15 x10E6/uL (ref 4.14–5.80)
RDW: 16.9 % — AB (ref 12.3–15.4)
WBC: 4.3 10*3/uL (ref 3.4–10.8)

## 2017-12-02 LAB — BASIC METABOLIC PANEL
BUN / CREAT RATIO: 15 (ref 9–20)
BUN: 18 mg/dL (ref 6–24)
CO2: 24 mmol/L (ref 20–29)
CREATININE: 1.24 mg/dL (ref 0.76–1.27)
Calcium: 9.7 mg/dL (ref 8.7–10.2)
Chloride: 97 mmol/L (ref 96–106)
GFR, EST AFRICAN AMERICAN: 82 mL/min/{1.73_m2} (ref >59)
GFR, EST NON AFRICAN AMERICAN: 71 mL/min/{1.73_m2} (ref >59)
Glucose: 90 mg/dL (ref 65–99)
POTASSIUM: 5 mmol/L (ref 3.5–5.2)
SODIUM: 143 mmol/L (ref 134–144)

## 2017-12-07 ENCOUNTER — Other Ambulatory Visit: Payer: Self-pay

## 2017-12-07 ENCOUNTER — Encounter (HOSPITAL_COMMUNITY): Payer: Self-pay | Admitting: Internal Medicine

## 2017-12-07 ENCOUNTER — Ambulatory Visit (HOSPITAL_COMMUNITY)
Admission: RE | Admit: 2017-12-07 | Discharge: 2017-12-07 | Disposition: A | Discharge status: Home / Self Care | Payer: Self-pay | Source: Ambulatory Visit | Attending: Internal Medicine | Admitting: Internal Medicine

## 2017-12-07 VITALS — BP 114/72 | HR 108 | Wt 161.0 lb

## 2017-12-07 DIAGNOSIS — I130 Hypertensive heart and chronic kidney disease with heart failure and stage 1 through stage 4 chronic kidney disease, or unspecified chronic kidney disease: Secondary | ICD-10-CM | POA: Insufficient documentation

## 2017-12-07 DIAGNOSIS — Z8673 Personal history of transient ischemic attack (TIA), and cerebral infarction without residual deficits: Secondary | ICD-10-CM | POA: Insufficient documentation

## 2017-12-07 DIAGNOSIS — I428 Other cardiomyopathies: Secondary | ICD-10-CM | POA: Insufficient documentation

## 2017-12-07 DIAGNOSIS — Z87891 Personal history of nicotine dependence: Secondary | ICD-10-CM | POA: Insufficient documentation

## 2017-12-07 DIAGNOSIS — Z885 Allergy status to narcotic agent status: Secondary | ICD-10-CM | POA: Insufficient documentation

## 2017-12-07 DIAGNOSIS — Z7901 Long term (current) use of anticoagulants: Secondary | ICD-10-CM | POA: Insufficient documentation

## 2017-12-07 DIAGNOSIS — Z880 Allergy status to penicillin: Secondary | ICD-10-CM | POA: Insufficient documentation

## 2017-12-07 DIAGNOSIS — Z720 Tobacco use: Secondary | ICD-10-CM

## 2017-12-07 DIAGNOSIS — Z79899 Other long term (current) drug therapy: Secondary | ICD-10-CM | POA: Insufficient documentation

## 2017-12-07 DIAGNOSIS — I5022 Chronic systolic (congestive) heart failure: Secondary | ICD-10-CM | POA: Insufficient documentation

## 2017-12-07 DIAGNOSIS — F1910 Other psychoactive substance abuse, uncomplicated: Secondary | ICD-10-CM

## 2017-12-07 DIAGNOSIS — R079 Chest pain, unspecified: Secondary | ICD-10-CM | POA: Insufficient documentation

## 2017-12-07 DIAGNOSIS — N182 Chronic kidney disease, stage 2 (mild): Secondary | ICD-10-CM | POA: Insufficient documentation

## 2017-12-07 DIAGNOSIS — Z888 Allergy status to other drugs, medicaments and biological substances status: Secondary | ICD-10-CM | POA: Insufficient documentation

## 2017-12-07 DIAGNOSIS — Z7982 Long term (current) use of aspirin: Secondary | ICD-10-CM | POA: Insufficient documentation

## 2017-12-07 DIAGNOSIS — J45909 Unspecified asthma, uncomplicated: Secondary | ICD-10-CM | POA: Insufficient documentation

## 2017-12-07 DIAGNOSIS — I509 Heart failure, unspecified: Secondary | ICD-10-CM

## 2017-12-07 LAB — BASIC METABOLIC PANEL
ANION GAP: 8 (ref 5–15)
BUN: 17 mg/dL (ref 6–20)
CHLORIDE: 106 mmol/L (ref 101–111)
CO2: 27 mmol/L (ref 22–32)
Calcium: 9.3 mg/dL (ref 8.9–10.3)
Creatinine, Ser: 1.16 mg/dL (ref 0.61–1.24)
GFR calc non Af Amer: >60 mL/min (ref >60)
Glucose, Bld: 81 mg/dL (ref 65–99)
Potassium: 3.7 mmol/L (ref 3.5–5.1)
Sodium: 141 mmol/L (ref 135–145)

## 2017-12-07 MED ORDER — SPIRONOLACTONE 25 MG PO TABS: 12.5000 mg | ORAL_TABLET | Freq: Every day | ORAL | 3 refills | Status: DC

## 2017-12-07 MED ORDER — LOSARTAN POTASSIUM 25 MG PO TABS: 25.0000 mg | ORAL_TABLET | Freq: Every day | ORAL | 3 refills | Status: DC

## 2017-12-07 MED ORDER — FUROSEMIDE 80 MG PO TABS: 40.0000 mg | ORAL_TABLET | Freq: Every day | ORAL | 5 refills | Status: DC

## 2017-12-07 MED ORDER — DIGOXIN 125 MCG PO TABS: 0.1250 mg | ORAL_TABLET | Freq: Every day | ORAL | 3 refills | Status: DC

## 2017-12-07 MED FILL — FUROSEMIDE 80 MG TABLET: 80 | 30 days supply | Qty: 15 | Fill #0

## 2017-12-07 MED FILL — DIGOXIN 0.125 MG TABLET: 125 | 30 days supply | Qty: 30 | Fill #0

## 2017-12-07 MED FILL — LOSARTAN POTASSIUM 25 MG TA: 25 | 30 days supply | Qty: 30 | Fill #0

## 2017-12-07 MED FILL — SPIRONOLACTONE 25 MG TABLET: 25 | 30 days supply | Qty: 15 | Fill #0

## 2017-12-07 NOTE — Patient Instructions (Addendum)
Start Losartan 25 mg (1 tab) every night  Start Digoxin 0.125 mg (1 tab) daily  Start Spironolactone 12.5 mg (1/2 tab) daily  Continue taking Asprin 81 mg (1 tab) daily  Continue taking Furosemide 40 mg (1 tab) daily  Labs drawn today (if we do not call you, then your lab work was stable)   You have been referred to [facility]   Your physician recommends that you schedule a follow-up appointment in: 2 weeks with APP

## 2017-12-07 NOTE — Progress Notes (Signed)
Prescriptions for Digoxin, Furosemide, Losartan and Spironolactone faxed to [facility] pharmacy under HF FUND.  Annice Pih also meet with patient to discuss.

## 2017-12-07 NOTE — Progress Notes (Signed)
ADVANCED HF CLINIC CONSULT NOTE  Referring Physician: Dr Delton See Primary Care: [facility].   HPI:  Mark [name] is a 42 y.o. male referred by Dr. Delton See for further evaluation and treatment of systolic HF.   He has h/o asthma, polysubstance use, HTN, CVA.   Presented to ER in 5/17 with respiratory failure. Found to have new-onset systolic HF. EF 20-25%. Cath at that time with normal cors.  RV: 46/19 PA: 45/25 (33) PW: 22 AO: 98/74 LV: 97/12/29 Oxygen saturation: AO 91%; PA 41% Cardiac output: 3.13 (Fick); 5.6 L/m (thermodilution) Cardiac index: 1.53 2.75 liters per minute per meter squared  Shortly after leaving the hospital was incarcerated for 14 months until 8/18. Was on HF meds in prison and did relatively well. Readmitted in 1/19 with recurrent HF. UDS + for cocaine.  LVEF 20%. Diuresed and sent home. Readmitted 09/28/17 for recurrent respiratory failure requiring BiPAP and acute encephaolapthy. Found to have dense left hemiparesis. MRI revealed acute right MCA CVA. CIR recommended but patient left AMA.   Repeat echo 09/28/17 EF 10-15%.   Recently admitted 3/21 - 11/14/17 with SOB and CP. Troponin mildly elevated. No EKG changes. BNP 3000. Diuresed with IV lasix. UDS negative this admit.  He refused to take his medications at several intervals during this admit.  Dr. Delton See spoke with Dr. [name]. HF follow up arranged.   Here by himself. Living with a friend at her house. Doesn't know what his hear meds are. Says he takes the fluid pill regularly. Says he feels ok. Initially had poor appetite but now improving. Can walk in store but gets SOB and fatigues quickly. No edema, orthopnea or PND. Has stopped ETOH, cocaine, cigarettes and marijuana cold [place] when he had the stroke. L-sided weakness now fully resolved.     Review of Systems: [y] = yes, [ ]  = no   General: Weight gain [ ] ; Weight loss [ ] ; Anorexia [ ] ; Fatigue [ ] ; Fever [ ] ; Chills [ ] ;  Weakness [ ]   Cardiac: Chest pain/pressure [ ] ; Resting SOB [ ] ; Exertional SOB [place] ]; Orthopnea [ ] ; Pedal Edema [ ] ; Palpitations [ ] ; Syncope [ ] ; Presyncope [ ] ; Paroxysmal nocturnal dyspnea[ ]   Pulmonary: Cough [ ] ; Wheezing[ ] ; Hemoptysis[ ] ; Sputum [ ] ; Snoring [ ]   GI: Vomiting[ ] ; Dysphagia[ ] ; Melena[ ] ; Hematochezia [ ] ; Heartburn[ ] ; Abdominal pain [ ] ; Constipation [ ] ; Diarrhea [ ] ; BRBPR [ ]   GU: Hematuria[ ] ; Dysuria [ ] ; Nocturia[ ]   Vascular: Pain in legs with walking [ ] ; Pain in feet with lying flat [ ] ; Non-healing sores [ ] ; Stroke [ ] ; TIA [ ] ; Slurred speech [ ] ;  Neuro: Headaches[ ] ; Vertigo[ ] ; Seizures[ ] ; Paresthesias[ ] ;Blurred vision [ ] ; Diplopia [ ] ; Vision changes [ ]   Ortho/Skin: Arthritis [ ] ; Joint pain [ ] ; Muscle pain [ ] ; Joint swelling [ ] ; Back Pain [ ] ; Rash [ ]   Psych: Depression[ ] ; Anxiety[ ]   Heme: Bleeding problems [ ] ; Clotting disorders [ ] ; Anemia [ ]   Endocrine: Diabetes [ ] ; Thyroid dysfunction[ ]    Past Medical History:  Diagnosis Date  . Asthma   . Chronic systolic CHF (congestive heart failure) (HCC)   . Cigarette smoker   . CKD (chronic kidney disease), stage II    [name] 10/01/2017  . History of echocardiogram    a. Echo 5/17 - EF 20-25%, severe diff HK, restrictive physiology, mild to mod MR, severe  reduced RVSF, mod RVE, mild RAE, mod TR, PASP 48 mmHg  . Hx of cardiac cath    a. LHC 5/17 - normal coronary arteries. PA 45/25, mean 33, PCWP mean 18  . NICM (nonischemic cardiomyopathy) (HCC)   . Stroke (HCC) 09/27/2017   "was weak on my left side; I'm fully recovered" (11/09/2017)  . Substance abuse (HCC)    cocaine, marijuana    Current Outpatient Medications  Medication Sig Dispense Refill  . albuterol (PROVENTIL HFA;VENTOLIN HFA) 108 (90 Base) MCG/ACT inhaler Inhale 1-2 puffs into the lungs every 4 (four) hours as needed for wheezing or shortness of breath. 1 Inhaler 4  . aspirin 81 MG EC tablet Take 1 tablet (81 mg total) by  mouth daily. 30 tablet 4  . atorvastatin (LIPITOR) 80 MG tablet Take 1 tablet (80 mg total) by mouth daily at 6 PM. 30 tablet 0  . budesonide-formoterol (SYMBICORT) 80-4.5 MCG/ACT inhaler Inhale 2 puffs into the lungs 2 (two) times daily. 1 Inhaler 3  . carvedilol (COREG) 3.125 MG tablet Take 1 tablet (3.125 mg total) by mouth 2 (two) times daily. (Patient taking differently: Take 3.125 mg by mouth 2 (two) times daily with a meal. ) 60 tablet 11  . furosemide (LASIX) 80 MG tablet Take 0.5 tablets (40 mg total) by mouth daily. 30 tablet 5  . ivabradine (CORLANOR) 5 MG TABS tablet Take 1 tablet (5 mg total) by mouth 2 (two) times daily with a meal. 60 tablet 0  . montelukast (SINGULAIR) 10 MG tablet Take 1 tablet (10 mg total) by mouth at bedtime. 30 tablet 3  . Multiple Vitamin (MULTIVITAMIN WITH MINERALS) TABS tablet Take 1 tablet by mouth daily.     No current facility-administered medications for this encounter.     Allergies  Allergen Reactions  . Hydrocodone Hives  . Lisinopril Swelling and Other (See Comments)    Facial swelling/angioedema  . Prednisone Shortness Of Breath, Nausea Only, Swelling and Other (See Comments)    Also made chest feel tight and genital area, legs, and face became swollen badly  . Penicillins Hives and Swelling     Has patient had a PCN reaction causing immediate rash, facial/tongue/throat swelling, SOB or lightheadedness with hypotension: Yes Has patient had a PCN reaction causing severe rash involving mucus membranes or skin necrosis: No Has patient had a PCN reaction that required hospitalization: No Has patient had a PCN reaction occurring within the last 10 years: No If all of the above answers are "NO", then may proceed with Cephalosporin use.       Social History   Socioeconomic History  . Marital status: Divorced    Spouse name: Not on file  . Number of children: Not on file  . Years of education: Not on file  . Highest education level: Not  on file  Occupational History  . Not on file  Social Needs  . Financial resource strain: Not on file  . Food insecurity:    Worry: Not on file    Inability: Not on file  . Transportation needs:    Medical: Not on file    Non-medical: Not on file  Tobacco Use  . Smoking status: Former Smoker    Packs/day: 1.00    Years: 30.00    Pack years: 30.00    Types: Cigarettes    Last attempt to quit: 09/27/2017    Years since quitting: 0.1  . Smokeless tobacco: Never Used  Substance and Sexual Activity  .  Alcohol use: Yes    Alcohol/week: 3.0 oz    Types: 5 Shots of liquor per week    Frequency: Never    Comment: 5-6 shots of vodka daily; "stopped it all after I had stroke 09/27/2017"  . Drug use: Yes    Types: Cocaine, Marijuana    Comment: 11/09/2017 "none since 09/27/2017"  . Sexual activity: Not Currently  Lifestyle  . Physical activity:    Days per week: Not on file    Minutes per session: Not on file  . Stress: Not on file  Relationships  . Social connections:    Talks on phone: Not on file    Gets together: Not on file    Attends religious service: Not on file    Active member of club or organization: Not on file    Attends meetings of clubs or organizations: Not on file    Relationship status: Not on file  . Intimate partner violence:    Fear of current or ex partner: Not on file    Emotionally abused: Not on file    Physically abused: Not on file    Forced sexual activity: Not on file  Other Topics Concern  . Not on file  Social History Narrative  . Not on file      Family History  Problem Relation Age of Onset  . Cardiomyopathy Father        Reports his father has an LVAD  . Heart failure Father   . Hypertension Father   . Deep vein thrombosis Neg Hx     Vitals:   12/07/17 1148  BP: 114/72  Pulse: (!) 108  SpO2: 100%  Weight: 161 lb (73 kg)    PHYSICAL EXAM: General:  Well appearing. No respiratory difficulty HEENT: normal minimal left facial droop    Neck: supple. no JVD. Carotids 2+ bilat; no bruits. No lymphadenopathy or thryomegaly appreciated. Cor: PMI laterally displaced. Tachy regular. Soft s3  Lungs: clear Abdomen: soft, nontender, nondistended. No hepatosplenomegaly. No bruits or masses. Good bowel sounds. Extremities: no cyanosis, clubbing, rash, edema Neuro: alert & oriented x 3, cranial nerves grossly intact. moves all 4 extremities w/o difficulty. Affect pleasant.   ASSESSMENT & PLAN:  1. Chronic systolic CHF, Echo 09/2017 LVEF 10-15% - NICM by cath 2017. Suspect etiology is polysubstance abuse - NYHA III - Volume status look ok on exam - Has not been taking lipitor, corlanor, or coreg.  - Will start Losartan 25 daily, digoxin 0.125, lasix 40 daily, spiro 12.5 daily, ASA 81. No b-blocker yet with probable low output - Will need frequent f/u with HF Clinic. Paramedicine arranged - Long talk about possible need for advanced therapies down the road and need to remain off substances  2. Chest pain - During recent admission - Cardiac cath 2017. No CAD - Doubt ischemia  3. Polysubstance abuse - UDS was negative this admission. Has quit cold [place] after CVA.  - Long talk about possible need for advanced therapies down the road and need to remain off substances  4. 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 There is associated slow/absent flow within the associated right M2 branch. - No lasting deficit.  - He is on ASA and stain alone with poor prognosis. He has not been taking statin.  - Likely cardio-embolic. Ideally would be on warfarin but not compliant enough at this point. Reconsider in future if compliance improves.  Total time spent > 55 minutes. Over half that time spent discussing above.   Arvilla Meres, MD  10:09 PM

## 2017-12-11 ENCOUNTER — Telehealth: Payer: Self-pay

## 2017-12-11 ENCOUNTER — Ambulatory Visit: Payer: Self-pay | Admitting: Neurology

## 2017-12-11 ENCOUNTER — Encounter: Payer: Self-pay | Admitting: Neurology

## 2017-12-11 NOTE — Telephone Encounter (Signed)
Patient no show for appt today. 

## 2017-12-13 ENCOUNTER — Telehealth: Payer: Self-pay | Admitting: Licensed Clinical Social Worker

## 2017-12-13 NOTE — Telephone Encounter (Signed)
CSW spoke with patient via phone to discuss disability and medicaid applications. Patient states he has an upcoming appointment at [facility] and will need medical records to complete application. CSW encouraged patient to obtain release of information from Social Services for requested information. CSW also encouraged patient to apply for SSI as he has no income and will most likely need advanced therapies in the future. Patient verbalizes understanding and will follow up and return call to CSW. Lasandra Beech, LCSW, CCSW-MCS [phone]

## 2017-12-15 ENCOUNTER — Other Ambulatory Visit (HOSPITAL_COMMUNITY): Payer: Self-pay

## 2017-12-15 NOTE — Progress Notes (Signed)
Paramedicine Encounter    Patient ID: Mark [name], male    DOB: Jan 10, 1976, 42 y.o.   MRN: [id number]   Patient Care Team: Massie Maroon, FNP as PCP - General (Family Medicine) Kathleene Hazel, MD as PCP - Cardiology (Cardiology)  Patient Active Problem List   Diagnosis Date Noted  . Noncompliance   . Chronic anemia   . Acute systolic CHF (congestive heart failure) (HCC) 11/09/2017  . History of right MCA stroke 09/28/2017  . Stroke (HCC) 09/28/2017  . Acute on chronic systolic CHF (congestive heart failure) (HCC) 01/11/2016  . NICM (nonischemic cardiomyopathy) (HCC) 01/11/2016  . CKD (chronic kidney disease) stage 2, GFR 60-89 ml/min 01/11/2016  . Cocaine abuse (HCC) 01/11/2016  . Chest pain, pleuritic 01/03/2016  . Tobacco abuse 01/03/2016  . Asthma 01/03/2016  . Leg swelling 01/03/2016  . Tachycardia 01/03/2016  . Normocytic anemia 01/03/2016  . Elevated troponin I level 01/03/2016  . Right rib fracture 01/03/2016  . Chest pain     Current Outpatient Medications:  .  albuterol (PROVENTIL HFA;VENTOLIN HFA) 108 (90 Base) MCG/ACT inhaler, Inhale 1-2 puffs into the lungs every 4 (four) hours as needed for wheezing or shortness of breath., Disp: 1 Inhaler, Rfl: 4 .  aspirin 81 MG EC tablet, Take 1 tablet (81 mg total) by mouth daily., Disp: 30 tablet, Rfl: 4 .  budesonide-formoterol (SYMBICORT) 80-4.5 MCG/ACT inhaler, Inhale 2 puffs into the lungs 2 (two) times daily., Disp: 1 Inhaler, Rfl: 3 .  digoxin (LANOXIN) 0.125 MG tablet, Take 1 tablet (0.125 mg total) by mouth daily., Disp: 30 tablet, Rfl: 3 .  furosemide (LASIX) 80 MG tablet, Take 0.5 tablets (40 mg total) by mouth daily., Disp: 30 tablet, Rfl: 5 .  losartan (COZAAR) 25 MG tablet, Take 1 tablet (25 mg total) by mouth at bedtime., Disp: 30 tablet, Rfl: 3 .  Multiple Vitamin (MULTIVITAMIN WITH MINERALS) TABS tablet, Take 1 tablet by mouth daily., Disp: , Rfl:  .  spironolactone (ALDACTONE) 25 MG tablet, Take  0.5 tablets (12.5 mg total) by mouth daily., Disp: 15 tablet, Rfl: 3 .  montelukast (SINGULAIR) 10 MG tablet, Take 1 tablet (10 mg total) by mouth at bedtime. (Patient not taking: Reported on 12/15/2017), Disp: 30 tablet, Rfl: 3 Allergies  Allergen Reactions  . Hydrocodone Hives  . Lisinopril Swelling and Other (See Comments)    Facial swelling/angioedema  . Prednisone Shortness Of Breath, Nausea Only, Swelling and Other (See Comments)    Also made chest feel tight and genital area, legs, and face became swollen badly  . Penicillins Hives and Swelling     Has patient had a PCN reaction causing immediate rash, facial/tongue/throat swelling, SOB or lightheadedness with hypotension: Yes Has patient had a PCN reaction causing severe rash involving mucus membranes or skin necrosis: No Has patient had a PCN reaction that required hospitalization: No Has patient had a PCN reaction occurring within the last 10 years: No If all of the above answers are "NO", then may proceed with Cephalosporin use.       Social History   Socioeconomic History  . Marital status: Divorced    Spouse name: Not on file  . Number of children: Not on file  . Years of education: Not on file  . Highest education level: Not on file  Occupational History  . Not on file  Social Needs  . Financial resource strain: Not on file  . Food insecurity:    Worry: Not on file  Inability: Not on file  . Transportation needs:    Medical: Not on file    Non-medical: Not on file  Tobacco Use  . Smoking status: Former Smoker    Packs/day: 1.00    Years: 30.00    Pack years: 30.00    Types: Cigarettes    Last attempt to quit: 09/27/2017    Years since quitting: 0.2  . Smokeless tobacco: Never Used  Substance and Sexual Activity  . Alcohol use: Yes    Alcohol/week: 3.0 oz    Types: 5 Shots of liquor per week    Frequency: Never    Comment: 5-6 shots of vodka daily; "stopped it all after I had stroke 09/27/2017"  . Drug  use: Yes    Types: Cocaine, Marijuana    Comment: 11/09/2017 "none since 09/27/2017"  . Sexual activity: Not Currently  Lifestyle  . Physical activity:    Days per week: Not on file    Minutes per session: Not on file  . Stress: Not on file  Relationships  . Social connections:    Talks on phone: Not on file    Gets together: Not on file    Attends religious service: Not on file    Active member of club or organization: Not on file    Attends meetings of clubs or organizations: Not on file    Relationship status: Not on file  . Intimate partner violence:    Fear of current or ex partner: Not on file    Emotionally abused: Not on file    Physically abused: Not on file    Forced sexual activity: Not on file  Other Topics Concern  . Not on file  Social History Narrative  . Not on file    Physical Exam  Constitutional: He is oriented to person, place, and time.  Cardiovascular: Regular rhythm. Tachycardia present.  Pulmonary/Chest: Effort normal and breath sounds normal.  Abdominal: Soft. He exhibits no distension.  Musculoskeletal: Normal range of motion. He exhibits no edema.  Neurological: He is alert and oriented to person, place, and time.  Skin: Skin is warm and dry.  Psychiatric: He has a normal mood and affect.        Future Appointments  Date Time Provider Department Center  12/22/2017  9:00 AM [facility] PA/NP [facility] None    BP 94/78 (BP Location: Left Arm, Patient Position: Sitting, Cuff Size: Large)   Pulse (!) 108   Resp 16   Wt 150 lb (68 kg)   SpO2 96%   BMI 22.48 kg/m   Weight yesterday- 155 lb Last visit weight- n/a   [name] was seen at home today for our first visit. He reported feeling well and stated he has been compliant with his medications. He denied SOB, headache, dizziness or orthopnea. He stated he has been walking from his house to the store at the end of the street, which is about 300 feet, without SOB. He stated he does not have Singulair  and ran out of his Ventolin MDI. He is taking Dulera daily whoever it is not on his medication list in Epic. Additionally, he had prescriptions for Bidil and metformin however he said he stopped taking those after his appointment at the HF clinic. He stated he was never diabetic so he was unsure why he was prescribed Metformin. I have encouraged him to contact his PCP to get a new prescription for the MDI and he was agreeable. He was provided with a pillbox  which was filled for him.   Time spent with patient: 54 minutes  Jacqualine Code, EMT 12/15/17  ACTION: Home visit completed Next visit planned for 1 week

## 2017-12-18 ENCOUNTER — Encounter (HOSPITAL_COMMUNITY): Payer: Self-pay | Admitting: *Deleted

## 2017-12-22 ENCOUNTER — Other Ambulatory Visit (HOSPITAL_COMMUNITY): Payer: Self-pay

## 2017-12-22 ENCOUNTER — Encounter (HOSPITAL_COMMUNITY): Payer: Self-pay

## 2017-12-22 ENCOUNTER — Ambulatory Visit (HOSPITAL_COMMUNITY)
Admission: RE | Admit: 2017-12-22 | Discharge: 2017-12-22 | Disposition: A | Discharge status: Home / Self Care | Payer: Self-pay | Source: Ambulatory Visit | Attending: Cardiology | Admitting: Cardiology

## 2017-12-22 VITALS — BP 102/70 | HR 92 | Wt 150.2 lb

## 2017-12-22 DIAGNOSIS — N182 Chronic kidney disease, stage 2 (mild): Secondary | ICD-10-CM | POA: Insufficient documentation

## 2017-12-22 DIAGNOSIS — Z79899 Other long term (current) drug therapy: Secondary | ICD-10-CM | POA: Insufficient documentation

## 2017-12-22 DIAGNOSIS — I428 Other cardiomyopathies: Secondary | ICD-10-CM | POA: Insufficient documentation

## 2017-12-22 DIAGNOSIS — Z8673 Personal history of transient ischemic attack (TIA), and cerebral infarction without residual deficits: Secondary | ICD-10-CM | POA: Insufficient documentation

## 2017-12-22 DIAGNOSIS — Z880 Allergy status to penicillin: Secondary | ICD-10-CM | POA: Insufficient documentation

## 2017-12-22 DIAGNOSIS — Z885 Allergy status to narcotic agent status: Secondary | ICD-10-CM | POA: Insufficient documentation

## 2017-12-22 DIAGNOSIS — F1910 Other psychoactive substance abuse, uncomplicated: Secondary | ICD-10-CM | POA: Insufficient documentation

## 2017-12-22 DIAGNOSIS — I130 Hypertensive heart and chronic kidney disease with heart failure and stage 1 through stage 4 chronic kidney disease, or unspecified chronic kidney disease: Secondary | ICD-10-CM | POA: Insufficient documentation

## 2017-12-22 DIAGNOSIS — I5022 Chronic systolic (congestive) heart failure: Secondary | ICD-10-CM | POA: Insufficient documentation

## 2017-12-22 DIAGNOSIS — Z8249 Family history of ischemic heart disease and other diseases of the circulatory system: Secondary | ICD-10-CM | POA: Insufficient documentation

## 2017-12-22 DIAGNOSIS — Z7951 Long term (current) use of inhaled steroids: Secondary | ICD-10-CM | POA: Insufficient documentation

## 2017-12-22 DIAGNOSIS — Z720 Tobacco use: Secondary | ICD-10-CM

## 2017-12-22 DIAGNOSIS — Z7982 Long term (current) use of aspirin: Secondary | ICD-10-CM | POA: Insufficient documentation

## 2017-12-22 DIAGNOSIS — Z888 Allergy status to other drugs, medicaments and biological substances status: Secondary | ICD-10-CM | POA: Insufficient documentation

## 2017-12-22 DIAGNOSIS — Z87891 Personal history of nicotine dependence: Secondary | ICD-10-CM | POA: Insufficient documentation

## 2017-12-22 DIAGNOSIS — J45909 Unspecified asthma, uncomplicated: Secondary | ICD-10-CM | POA: Insufficient documentation

## 2017-12-22 LAB — BASIC METABOLIC PANEL
Anion gap: 9 (ref 5–15)
BUN: 12 mg/dL (ref 6–20)
CALCIUM: 9.6 mg/dL (ref 8.9–10.3)
CHLORIDE: 103 mmol/L (ref 101–111)
CO2: 28 mmol/L (ref 22–32)
CREATININE: 1.06 mg/dL (ref 0.61–1.24)
GFR calc Af Amer: >60 mL/min (ref >60)
GFR calc non Af Amer: >60 mL/min (ref >60)
Glucose, Bld: 89 mg/dL (ref 65–99)
Potassium: 4.4 mmol/L (ref 3.5–5.1)
Sodium: 140 mmol/L (ref 135–145)

## 2017-12-22 NOTE — Patient Instructions (Signed)
Labs today We will only contact you if something comes back abnormal or we need to make some changes. Otherwise no news is good news!   Your physician recommends that you schedule a follow-up appointment in: 2 weeks with Andy Tillery,PA   Do the following things EVERYDAY: 1) Weigh yourself in the morning before breakfast. Write it down and keep it in a log. 2) Take your medicines as prescribed 3) Eat low salt foods-Limit salt (sodium) to 2000 mg per day.  4) Stay as active as you can everyday 5) Limit all fluids for the day to less than 2 liters   

## 2017-12-22 NOTE — Progress Notes (Signed)
Advanced Heart Failure Clinic Note    Referring Physician: Dr Delton See Primary Care: [facility].   HPI:  Mark [name] is a 42 y.o. male referred by Dr. Delton See for further evaluation and treatment of systolic HF.   He has h/o asthma, polysubstance use, HTN, CVA.   Presented to ER in 5/17 with respiratory failure. Found to have new-onset systolic HF. EF 20-25%. Cath at that time with normal cors.  RV: 46/19 PA: 45/25 (33) PW: 22 AO: 98/74 LV: 97/12/29 Oxygen saturation: AO 91%; PA 41% Cardiac output: 3.13 (Fick); 5.6 L/m (thermodilution) Cardiac index: 1.53 2.75 liters per minute per meter squared  Shortly after leaving the hospital was incarcerated for 14 months until 8/18. Was on HF meds in prison and did relatively well. Readmitted in 1/19 with recurrent HF. UDS + for cocaine.  LVEF 20%. Diuresed and sent home. Readmitted 09/28/17 for recurrent respiratory failure requiring BiPAP and acute encephaolapthy. Found to have dense left hemiparesis. MRI revealed acute right MCA CVA. CIR recommended but patient left AMA.   Repeat echo 09/28/17 EF 10-15%.   Recently admitted 3/21 - 11/14/17 with SOB and CP. Troponin mildly elevated. No EKG changes. BNP 3000. Diuresed with IV lasix. UDS negative this admit.  He refused to take his medications at several intervals during this admit.  Dr. Delton See spoke with Dr. [name]. HF follow up arranged.   He presents today for follow up. Last visit multiple HF meds added.  Paramedicine following. He is feeling much better. Appetite improved. He is no longer walking to the store or through a parking lot. No lightheadedness or dizziness. No edema, orthopnea, or PND. He denies any substance abuse since his stroke. He is working out with [employer] and body weight exercises daily. Taking all medication as directed.   Review of systems complete and found to be negative unless listed in HPI.    Past Medical History:  Diagnosis Date  .  Asthma   . Chronic systolic CHF (congestive heart failure) (HCC)   . Cigarette smoker   . CKD (chronic kidney disease), stage II    [name] 10/01/2017  . History of echocardiogram    a. Echo 5/17 - EF 20-25%, severe diff HK, restrictive physiology, mild to mod MR, severe reduced RVSF, mod RVE, mild RAE, mod TR, PASP 48 mmHg  . Hx of cardiac cath    a. LHC 5/17 - normal coronary arteries. PA 45/25, mean 33, PCWP mean 18  . NICM (nonischemic cardiomyopathy) (HCC)   . Stroke (HCC) 09/27/2017   "was weak on my left side; I'm fully recovered" (11/09/2017)  . Substance abuse (HCC)    cocaine, marijuana    Current Outpatient Medications  Medication Sig Dispense Refill  . albuterol (PROVENTIL HFA;VENTOLIN HFA) 108 (90 Base) MCG/ACT inhaler Inhale 1-2 puffs into the lungs every 4 (four) hours as needed for wheezing or shortness of breath. 1 Inhaler 4  . aspirin 81 MG EC tablet Take 1 tablet (81 mg total) by mouth daily. 30 tablet 4  . budesonide-formoterol (SYMBICORT) 80-4.5 MCG/ACT inhaler Inhale 2 puffs into the lungs 2 (two) times daily. 1 Inhaler 3  . digoxin (LANOXIN) 0.125 MG tablet Take 1 tablet (0.125 mg total) by mouth daily. 30 tablet 3  . furosemide (LASIX) 80 MG tablet Take 0.5 tablets (40 mg total) by mouth daily. 30 tablet 5  . losartan (COZAAR) 25 MG tablet Take 1 tablet (25 mg total) by mouth at bedtime. 30 tablet 3  .  Multiple Vitamin (MULTIVITAMIN WITH MINERALS) TABS tablet Take 1 tablet by mouth daily.    Marland Kitchen spironolactone (ALDACTONE) 25 MG tablet Take 0.5 tablets (12.5 mg total) by mouth daily. 15 tablet 3  . montelukast (SINGULAIR) 10 MG tablet Take 1 tablet (10 mg total) by mouth at bedtime. (Patient not taking: Reported on 12/22/2017) 30 tablet 3   No current facility-administered medications for this encounter.     Allergies  Allergen Reactions  . Hydrocodone Hives  . Lisinopril Swelling and Other (See Comments)    Facial swelling/angioedema  . Prednisone Shortness Of  Breath, Nausea Only, Swelling and Other (See Comments)    Also made chest feel tight and genital area, legs, and face became swollen badly  . Penicillins Hives and Swelling     Has patient had a PCN reaction causing immediate rash, facial/tongue/throat swelling, SOB or lightheadedness with hypotension: Yes Has patient had a PCN reaction causing severe rash involving mucus membranes or skin necrosis: No Has patient had a PCN reaction that required hospitalization: No Has patient had a PCN reaction occurring within the last 10 years: No If all of the above answers are "NO", then may proceed with Cephalosporin use.       Social History   Socioeconomic History  . Marital status: Divorced    Spouse name: Not on file  . Number of children: Not on file  . Years of education: Not on file  . Highest education level: Not on file  Occupational History  . Not on file  Social Needs  . Financial resource strain: Not on file  . Food insecurity:    Worry: Not on file    Inability: Not on file  . Transportation needs:    Medical: Not on file    Non-medical: Not on file  Tobacco Use  . Smoking status: Former Smoker    Packs/day: 1.00    Years: 30.00    Pack years: 30.00    Types: Cigarettes    Last attempt to quit: 09/27/2017    Years since quitting: 0.2  . Smokeless tobacco: Never Used  Substance and Sexual Activity  . Alcohol use: Yes    Alcohol/week: 3.0 oz    Types: 5 Shots of liquor per week    Frequency: Never    Comment: 5-6 shots of vodka daily; "stopped it all after I had stroke 09/27/2017"  . Drug use: Yes    Types: Cocaine, Marijuana    Comment: 11/09/2017 "none since 09/27/2017"  . Sexual activity: Not Currently  Lifestyle  . Physical activity:    Days per week: Not on file    Minutes per session: Not on file  . Stress: Not on file  Relationships  . Social connections:    Talks on phone: Not on file    Gets together: Not on file    Attends religious service: Not on file      Active member of club or organization: Not on file    Attends meetings of clubs or organizations: Not on file    Relationship status: Not on file  . Intimate partner violence:    Fear of current or ex partner: Not on file    Emotionally abused: Not on file    Physically abused: Not on file    Forced sexual activity: Not on file  Other Topics Concern  . Not on file  Social History Narrative  . Not on file      Family History  Problem Relation Age  of Onset  . Cardiomyopathy Father        Reports his father has an LVAD  . Heart failure Father   . Hypertension Father   . Deep vein thrombosis Neg Hx    Vitals:   12/22/17 0911  BP: 102/70  Pulse: (!) 50  SpO2: 96%  Weight: 150 lb 3.2 oz (68.1 kg)   PHYSICAL EXAM: General: Well appearing. No resp difficulty. HEENT: Normal Neck: Supple. JVP 5-6. Carotids 2+ bilat; no bruits. No thyromegaly or nodule noted. Cor: PMI nondisplaced. RRR. Soft S3.  Lungs: CTAB, normal effort. Abdomen: Soft, non-tender, non-distended, no HSM. No bruits or masses. +BS  Extremities: No cyanosis, clubbing, or rash. R and LLE no edema.  Neuro: Alert & orientedx3, cranial nerves grossly intact. moves all 4 extremities w/o difficulty. Affect pleasant   ASSESSMENT & PLAN:  1. Chronic systolic CHF, Echo 09/2017 LVEF 10-15% - NICM by cath 2017. Suspect etiology is polysubstance abuse - NYHA II - Volume status stable on exam.   - continue lasix 40 mg daily.  - Continue losartan 25 mg daily - Continue digoxin 0.125 mg daily - Continue spiro 12.5 mg daily.  - No BB with probably low output.  - Continue paramedicine.  - Long talk about possible need for advanced therapies down the road and need to remain off substances - Reinforced fluid restriction to < 2 L daily, sodium restriction to less than 2000 mg daily, and the importance of daily weights.    2. Chest pain - During recent admission. Denies any further. - Cardiac cath 2017. No CAD - On ASA  81 mg daily.  - Doubt ischemia  3. Polysubstance abuse - UDS was negative recent admission. Has quit cold [place] after CVA.  - Long talk about possible need for advanced therapies down the road and need to remain off substances - Encouraged complete cessation.   4. 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 There is associated slow/absent flow within the associated right M2 branch. - No lasting deficit or recurrent symptoms.  - He is on ASA and stain alone with poor prognosis. He has not been taking statin.  - Likely cardio-embolic. Ideally would be on warfarin but not compliant enough at this point. Reconsider in future if compliance improves.   Doing great overall. No room to adjust meds today. Labs today. RTC 2 weeks.  Graciella Freer, PA-C  9:12 AM  Greater than 50% of the 25 minute visit was spent in counseling/coordination of care regarding disease state education, salt/fluid restriction, sliding scale diuretics, and medication compliance.

## 2017-12-22 NOTE — Progress Notes (Signed)
Paramedicine Encounter   Patient ID: Mark [name] , male,   DOB: Dec 24, 1975,41 y.o.,  MRN: [id number]  Mark [name] was seen in the HF clinic today with Otilio Saber, PA-C. He reports feeling well and denied SOB, headache, dizziness or orthopnea. He stated he has been exercising regularly by walking and using weights on his leg and a stress ball to strengthen his grip since his stroke. Per Mardelle Matte, no medication changes are required because his blood pressure was on the low side of normal. I will follow up next week.   Time spent with patient: 32 minutes  Jacqualine Code, EMT 12/22/2017   ACTION: Home visit completed Next visit planned for 1 week

## 2017-12-29 ENCOUNTER — Other Ambulatory Visit (HOSPITAL_COMMUNITY): Payer: Self-pay

## 2017-12-29 MED FILL — SPIRONOLACTONE 25 MG TABLET: 25 | 30 days supply | Qty: 15 | Fill #1

## 2017-12-29 MED FILL — FUROSEMIDE 80 MG TABLET: 80 | 30 days supply | Qty: 15 | Fill #1

## 2017-12-29 NOTE — Progress Notes (Signed)
Paramedicine Encounter    Patient ID: Mark [name], male    DOB: 1975-09-11, 42 y.o.   MRN: [id number]   Patient Care Team: Mark Maroon, FNP as PCP - General (Family Medicine) Mark Hazel, MD as PCP - Cardiology (Cardiology)  Patient Active Problem List   Diagnosis Date Noted  . Noncompliance   . Chronic anemia   . Acute systolic CHF (congestive heart failure) (HCC) 11/09/2017  . History of right MCA stroke 09/28/2017  . Stroke (HCC) 09/28/2017  . Chronic systolic heart failure (HCC) 01/11/2016  . NICM (nonischemic cardiomyopathy) (HCC) 01/11/2016  . CKD (chronic kidney disease) stage 2, GFR 60-89 ml/min 01/11/2016  . Cocaine abuse (HCC) 01/11/2016  . Chest pain, pleuritic 01/03/2016  . Tobacco abuse 01/03/2016  . Asthma 01/03/2016  . Leg swelling 01/03/2016  . Tachycardia 01/03/2016  . Normocytic anemia 01/03/2016  . Elevated troponin I level 01/03/2016  . Right rib fracture 01/03/2016  . Chest pain     Current Outpatient Medications:  .  albuterol (PROVENTIL HFA;VENTOLIN HFA) 108 (90 Base) MCG/ACT inhaler, Inhale 1-2 puffs into the lungs every 4 (four) hours as needed for wheezing or shortness of breath., Disp: 1 Inhaler, Rfl: 4 .  aspirin 81 MG EC tablet, Take 1 tablet (81 mg total) by mouth daily., Disp: 30 tablet, Rfl: 4 .  digoxin (LANOXIN) 0.125 MG tablet, Take 1 tablet (0.125 mg total) by mouth daily., Disp: 30 tablet, Rfl: 3 .  furosemide (LASIX) 80 MG tablet, Take 0.5 tablets (40 mg total) by mouth daily., Disp: 30 tablet, Rfl: 5 .  losartan (COZAAR) 25 MG tablet, Take 1 tablet (25 mg total) by mouth at bedtime., Disp: 30 tablet, Rfl: 3 .  mometasone-formoterol (DULERA) 100-5 MCG/ACT AERO, Inhale 2 puffs into the lungs 2 (two) times daily., Disp: , Rfl:  .  Multiple Vitamin (MULTIVITAMIN WITH MINERALS) TABS tablet, Take 1 tablet by mouth daily., Disp: , Rfl:  .  spironolactone (ALDACTONE) 25 MG tablet, Take 0.5 tablets (12.5 mg total) by mouth  daily., Disp: 15 tablet, Rfl: 3 .  budesonide-formoterol (SYMBICORT) 80-4.5 MCG/ACT inhaler, Inhale 2 puffs into the lungs 2 (two) times daily. (Patient not taking: Reported on 12/29/2017), Disp: 1 Inhaler, Rfl: 3 Allergies  Allergen Reactions  . Hydrocodone Hives  . Lisinopril Swelling and Other (See Comments)    Facial swelling/angioedema  . Prednisone Shortness Of Breath, Nausea Only, Swelling and Other (See Comments)    Also made chest feel tight and genital area, legs, and face became swollen badly  . Penicillins Hives and Swelling     Has patient had a PCN reaction causing immediate rash, facial/tongue/throat swelling, SOB or lightheadedness with hypotension: Yes Has patient had a PCN reaction causing severe rash involving mucus membranes or skin necrosis: No Has patient had a PCN reaction that required hospitalization: No Has patient had a PCN reaction occurring within the last 10 years: No If all of the above answers are "NO", then may proceed with Cephalosporin use.       Social History   Socioeconomic History  . Marital status: Divorced    Spouse name: Not on file  . Number of children: Not on file  . Years of education: Not on file  . Highest education level: Not on file  Occupational History  . Not on file  Social Needs  . Financial resource strain: Not on file  . Food insecurity:    Worry: Not on file    Inability: Not on  file  . Transportation needs:    Medical: Not on file    Non-medical: Not on file  Tobacco Use  . Smoking status: Former Smoker    Packs/day: 1.00    Years: 30.00    Pack years: 30.00    Types: Cigarettes    Last attempt to quit: 09/27/2017    Years since quitting: 0.2  . Smokeless tobacco: Never Used  Substance and Sexual Activity  . Alcohol use: Yes    Alcohol/week: 3.0 oz    Types: 5 Shots of liquor per week    Frequency: Never    Comment: 5-6 shots of vodka daily; "stopped it all after I had stroke 09/27/2017"  . Drug use: Yes     Types: Cocaine, Marijuana    Comment: 11/09/2017 "none since 09/27/2017"  . Sexual activity: Not Currently  Lifestyle  . Physical activity:    Days per week: Not on file    Minutes per session: Not on file  . Stress: Not on file  Relationships  . Social connections:    Talks on phone: Not on file    Gets together: Not on file    Attends religious service: Not on file    Active member of club or organization: Not on file    Attends meetings of clubs or organizations: Not on file    Relationship status: Not on file  . Intimate partner violence:    Fear of current or ex partner: Not on file    Emotionally abused: Not on file    Physically abused: Not on file    Forced sexual activity: Not on file  Other Topics Concern  . Not on file  Social History Narrative  . Not on file    Physical Exam  Constitutional: He is oriented to person, place, and time.  Cardiovascular: Normal rate and regular rhythm.  Pulmonary/Chest: Effort normal. No respiratory distress.  Abdominal: Soft.  Musculoskeletal: Normal range of motion. He exhibits no edema.  Neurological: He is alert and oriented to person, place, and time.        Future Appointments  Date Time Provider Department Center  01/05/2018  9:00 AM [facility] PA/NP [facility] None    BP 90/70 (BP Location: Left Arm, Patient Position: Sitting, Cuff Size: Large)   Pulse 95   Resp 16   Wt 146 lb (66.2 kg)   SpO2 98%   BMI 21.88 kg/m   Weight yesterday- 143.8 lb Last visit weight- 150 lb  Mark [name] was seen at home today and reported feeling generally well. He denied SOB, headache, dizziness or orthopnea. He reported being compliant with his medications which were verified and his pillbox was refilled. He was short on spironolactone however the pharmacy advised that it was too early to order this medication. He has enough to last him through the weekend and I will make sure it is resolved next week. No other issues noted at this time.    Time spent with patient: 25 minutes  Jacqualine Code, EMT 12/29/17  ACTION: Home visit completed Next visit planned for 1 week

## 2018-01-04 NOTE — Progress Notes (Signed)
Advanced Heart Failure Clinic Note    Referring Physician: Dr Delton See Primary Care: [facility] health and wellness.   HPI:  Mark [name] is a 42 y.o. male referred by Dr. Delton See for further evaluation and treatment of systolic HF.   He has h/o asthma, polysubstance use, HTN, CVA.   Presented to ER in 5/17 with respiratory failure. Found to have new-onset systolic HF. EF 20-25%. Cath at that time with normal cors.  RV: 46/19 PA: 45/25 (33) PW: 22 AO: 98/74 LV: 97/12/29 Oxygen saturation: AO 91%; PA 41% Cardiac output: 3.13 (Fick); 5.6 L/m (thermodilution) Cardiac index: 1.53 2.75 liters per minute per meter squared  Shortly after leaving the hospital was incarcerated for 14 months until 8/18. Was on HF meds in prison and did relatively well. Readmitted in 1/19 with recurrent HF. UDS + for cocaine.  LVEF 20%. Diuresed and sent home. Readmitted 09/28/17 for recurrent respiratory failure requiring BiPAP and acute encephaolapthy. Found to have dense left hemiparesis. MRI revealed acute right MCA CVA. CIR recommended but patient left AMA.   Repeat echo 09/28/17 EF 10-15%.   Admitted 3/21 - 11/14/17 with SOB and CP. Troponin mildly elevated. No EKG changes. BNP 3000. Diuresed with IV lasix. UDS negative this admit.  He refused to take his medications at several intervals during this admit.  Dr. Delton See spoke with Dr. [name]. HF follow up arranged.   He presents today for HF follow up. Overall doing well. His only complaint is that his LUE feels cold, which is ongoing since his stroke. No weakness. He denies SOB with stairs/hills. Able to [employer] with push mower with no problems. Denies orthopnea/PND/edema. No dizziness or CP. Taking all medications. Occasionally eats fast food, but otherwise doing well with salt and fluid restriction. He denies ETOH and drug use. He smoked 1 cigarette this morning. Weights stable at home 143-146 lbs. HF paramedicine following.   Review of systems complete  and found to be negative unless listed in HPI.   Past Medical History:  Diagnosis Date  . Asthma   . Chronic systolic CHF (congestive heart failure) (HCC)   . Cigarette smoker   . CKD (chronic kidney disease), stage II    [name] 10/01/2017  . History of echocardiogram    a. Echo 5/17 - EF 20-25%, severe diff HK, restrictive physiology, mild to mod MR, severe reduced RVSF, mod RVE, mild RAE, mod TR, PASP 48 mmHg  . Hx of cardiac cath    a. LHC 5/17 - normal coronary arteries. PA 45/25, mean 33, PCWP mean 18  . NICM (nonischemic cardiomyopathy) (HCC)   . Stroke (HCC) 09/27/2017   "was weak on my left side; I'm fully recovered" (11/09/2017)  . Substance abuse (HCC)    cocaine, marijuana    Current Outpatient Medications  Medication Sig Dispense Refill  . albuterol (PROVENTIL HFA;VENTOLIN HFA) 108 (90 Base) MCG/ACT inhaler Inhale 1-2 puffs into the lungs every 4 (four) hours as needed for wheezing or shortness of breath. 1 Inhaler 4  . aspirin 81 MG EC tablet Take 1 tablet (81 mg total) by mouth daily. 30 tablet 4  . budesonide-formoterol (SYMBICORT) 80-4.5 MCG/ACT inhaler Inhale 2 puffs into the lungs 2 (two) times daily. 1 Inhaler 3  . digoxin (LANOXIN) 0.125 MG tablet Take 1 tablet (0.125 mg total) by mouth daily. 30 tablet 3  . furosemide (LASIX) 80 MG tablet Take 0.5 tablets (40 mg total) by mouth daily. 30 tablet 5  . losartan (COZAAR) 25 MG tablet  Take 1 tablet (25 mg total) by mouth at bedtime. 30 tablet 3  . mometasone-formoterol (DULERA) 100-5 MCG/ACT AERO Inhale 2 puffs into the lungs 2 (two) times daily.    . Multiple Vitamin (MULTIVITAMIN WITH MINERALS) TABS tablet Take 1 tablet by mouth daily.    Marland Kitchen spironolactone (ALDACTONE) 25 MG tablet Take 0.5 tablets (12.5 mg total) by mouth daily. 15 tablet 3   No current facility-administered medications for this encounter.     Allergies  Allergen Reactions  . Hydrocodone Hives  . Lisinopril Swelling and Other (See Comments)     Facial swelling/angioedema  . Prednisone Shortness Of Breath, Nausea Only, Swelling and Other (See Comments)    Also made chest feel tight and genital area, legs, and face became swollen badly  . Penicillins Hives and Swelling     Has patient had a PCN reaction causing immediate rash, facial/tongue/throat swelling, SOB or lightheadedness with hypotension: Yes Has patient had a PCN reaction causing severe rash involving mucus membranes or skin necrosis: No Has patient had a PCN reaction that required hospitalization: No Has patient had a PCN reaction occurring within the last 10 years: No If all of the above answers are "NO", then may proceed with Cephalosporin use.       Social History   Socioeconomic History  . Marital status: Divorced    Spouse name: Not on file  . Number of children: Not on file  . Years of education: Not on file  . Highest education level: Not on file  Occupational History  . Not on file  Social Needs  . Financial resource strain: Not on file  . Food insecurity:    Worry: Not on file    Inability: Not on file  . Transportation needs:    Medical: Not on file    Non-medical: Not on file  Tobacco Use  . Smoking status: Former Smoker    Packs/day: 1.00    Years: 30.00    Pack years: 30.00    Types: Cigarettes    Last attempt to quit: 09/27/2017    Years since quitting: 0.2  . Smokeless tobacco: Never Used  Substance and Sexual Activity  . Alcohol use: Yes    Alcohol/week: 3.0 oz    Types: 5 Shots of liquor per week    Frequency: Never    Comment: 5-6 shots of vodka daily; "stopped it all after I had stroke 09/27/2017"  . Drug use: Yes    Types: Cocaine, Marijuana    Comment: 11/09/2017 "none since 09/27/2017"  . Sexual activity: Not Currently  Lifestyle  . Physical activity:    Days per week: Not on file    Minutes per session: Not on file  . Stress: Not on file  Relationships  . Social connections:    Talks on phone: Not on file    Gets together:  Not on file    Attends religious service: Not on file    Active member of club or organization: Not on file    Attends meetings of clubs or organizations: Not on file    Relationship status: Not on file  . Intimate partner violence:    Fear of current or ex partner: Not on file    Emotionally abused: Not on file    Physically abused: Not on file    Forced sexual activity: Not on file  Other Topics Concern  . Not on file  Social History Narrative  . Not on file  Family History  Problem Relation Age of Onset  . Cardiomyopathy Father        Reports his father has an LVAD  . Heart failure Father   . Hypertension Father   . Deep vein thrombosis Neg Hx    Vitals:   01/05/18 0917  BP: 112/74  Pulse: (!) 56  SpO2: 98%  Weight: 150 lb 12.8 oz (68.4 kg)    Wt Readings from Last 3 Encounters:  01/05/18 150 lb 12.8 oz (68.4 kg)  12/29/17 146 lb (66.2 kg)  12/22/17 150 lb 3.2 oz (68.1 kg)   PHYSICAL EXAM: General: Well appearing. No resp difficulty. HEENT: Normal Neck: Supple. JVP 5-6. Carotids 2+ bilat; no bruits. No thyromegaly or nodule noted. Cor: PMI nondisplaced. RRR, soft S3 Lungs: CTAB, normal effort. Abdomen: Soft, non-tender, non-distended, no HSM. No bruits or masses. +BS  Extremities: No cyanosis, clubbing, or rash. R and LLE no edema.  Neuro: Alert & orientedx3, cranial nerves grossly intact. moves all 4 extremities w/o difficulty. Affect pleasant  ASSESSMENT & PLAN:  1. Chronic systolic CHF, Echo 09/2017 LVEF 10-15% - NICM by cath 2017. Suspect etiology is polysubstance abuse - NYHA I-II - Volume status stable.  - Continue lasix 40 mg daily.  - Continue losartan 25 mg daily - Continue digoxin 0.125 mg daily - Increase spiro to 25 mg daily. BMET today and in 10 days.  - No BB with probable low output.  - Continue paramedicine.  - He has been counseled about possible need for advanced therapies down the road and need to remain off substances -  Reinforced fluid restriction to < 2 L daily, sodium restriction to less than 2000 mg daily, and the importance of daily weights.    2. Chest pain - During recent admission. Denies any further. - Cardiac cath 2017. No CAD - On ASA 81 mg daily for CVA - Denies s/s ischemia.   3. Polysubstance abuse - UDS was negative recent admission. Has quit cold [place] after CVA.  - He has been counseled about possible need for advanced therapies down the road and need to remain off substances - Denies ETOH and cocaine use. Smoked 1 cigarette today.   4. 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 There is associated slow/absent flow within the associated right M2 branch. - No lasting deficit or recurrent symptoms except that his LUE feels cold at times. - He is on ASA and stain alone with poor prognosis. He has not been taking statin.  - Likely cardio-embolic. Ideally would be on warfarin but not compliant enough at this point. Reconsider in future if compliance improves.  - Missed his appointment last month with neurology. Encouraged him to follow up.   BMET today and in 10 days. Increase spiro to 25 mg daily. Follow up 4 weeks.   Alford Highland, NP  9:18 AM  Greater than 50% of the 25 minute visit was spent in counseling/coordination of care regarding disease state education, salt/fluid restriction, sliding scale diuretics, and medication compliance.

## 2018-01-05 ENCOUNTER — Encounter (HOSPITAL_COMMUNITY): Payer: Self-pay

## 2018-01-05 ENCOUNTER — Other Ambulatory Visit (HOSPITAL_COMMUNITY): Payer: Self-pay

## 2018-01-05 ENCOUNTER — Ambulatory Visit (HOSPITAL_COMMUNITY)
Admission: RE | Admit: 2018-01-05 | Discharge: 2018-01-05 | Disposition: A | Discharge status: Home / Self Care | Payer: Self-pay | Source: Ambulatory Visit | Attending: Internal Medicine | Admitting: Internal Medicine

## 2018-01-05 VITALS — BP 112/74 | HR 56 | Wt 150.8 lb

## 2018-01-05 DIAGNOSIS — Z8709 Personal history of other diseases of the respiratory system: Secondary | ICD-10-CM | POA: Insufficient documentation

## 2018-01-05 DIAGNOSIS — N182 Chronic kidney disease, stage 2 (mild): Secondary | ICD-10-CM | POA: Insufficient documentation

## 2018-01-05 DIAGNOSIS — Z8249 Family history of ischemic heart disease and other diseases of the circulatory system: Secondary | ICD-10-CM | POA: Insufficient documentation

## 2018-01-05 DIAGNOSIS — Z87898 Personal history of other specified conditions: Secondary | ICD-10-CM

## 2018-01-05 DIAGNOSIS — Z7982 Long term (current) use of aspirin: Secondary | ICD-10-CM | POA: Insufficient documentation

## 2018-01-05 DIAGNOSIS — R079 Chest pain, unspecified: Secondary | ICD-10-CM | POA: Insufficient documentation

## 2018-01-05 DIAGNOSIS — I5022 Chronic systolic (congestive) heart failure: Secondary | ICD-10-CM | POA: Insufficient documentation

## 2018-01-05 DIAGNOSIS — Z885 Allergy status to narcotic agent status: Secondary | ICD-10-CM | POA: Insufficient documentation

## 2018-01-05 DIAGNOSIS — Z888 Allergy status to other drugs, medicaments and biological substances status: Secondary | ICD-10-CM | POA: Insufficient documentation

## 2018-01-05 DIAGNOSIS — Z8673 Personal history of transient ischemic attack (TIA), and cerebral infarction without residual deficits: Secondary | ICD-10-CM | POA: Insufficient documentation

## 2018-01-05 DIAGNOSIS — F17210 Nicotine dependence, cigarettes, uncomplicated: Secondary | ICD-10-CM | POA: Insufficient documentation

## 2018-01-05 DIAGNOSIS — I429 Cardiomyopathy, unspecified: Secondary | ICD-10-CM | POA: Insufficient documentation

## 2018-01-05 DIAGNOSIS — Z79899 Other long term (current) drug therapy: Secondary | ICD-10-CM | POA: Insufficient documentation

## 2018-01-05 DIAGNOSIS — Z880 Allergy status to penicillin: Secondary | ICD-10-CM | POA: Insufficient documentation

## 2018-01-05 DIAGNOSIS — I130 Hypertensive heart and chronic kidney disease with heart failure and stage 1 through stage 4 chronic kidney disease, or unspecified chronic kidney disease: Secondary | ICD-10-CM | POA: Insufficient documentation

## 2018-01-05 DIAGNOSIS — F1411 Cocaine abuse, in remission: Secondary | ICD-10-CM

## 2018-01-05 LAB — BASIC METABOLIC PANEL
ANION GAP: 9 (ref 5–15)
BUN: 11 mg/dL (ref 6–20)
CHLORIDE: 103 mmol/L (ref 101–111)
CO2: 28 mmol/L (ref 22–32)
Calcium: 9.7 mg/dL (ref 8.9–10.3)
Creatinine, Ser: 1.05 mg/dL (ref 0.61–1.24)
GFR calc non Af Amer: >60 mL/min (ref >60)
Glucose, Bld: 98 mg/dL (ref 65–99)
POTASSIUM: 3.8 mmol/L (ref 3.5–5.1)
Sodium: 140 mmol/L (ref 135–145)

## 2018-01-05 MED ORDER — SPIRONOLACTONE 25 MG PO TABS: 25.0000 mg | ORAL_TABLET | Freq: Every day | ORAL | 11 refills | Status: DC

## 2018-01-05 NOTE — Progress Notes (Signed)
Paramedicine Encounter   Patient ID: Mark [name] , male,   DOB: October 13, 1975,41 y.o.,  MRN: [id number]   Mr Stiteler was seen in the clinic today with Leslie Andrea, NP. She is getting labs today and increasing spironolactone and wants him back in 10 days for a repeat BMET. No other changes as of today.  Time spent with patient: 20 minutes  Jacqualine Code, EMT 01/05/2018   ACTION: Next visit planned for 1 week

## 2018-01-05 NOTE — Patient Instructions (Addendum)
INCREASE Spironolactone to 25 mg (1 whole tablet) once daily.  Routine lab work today. Will notify you of abnormal results, otherwise no news is good news!  Return in 1-2 weeks for repeat labs.  ___________________________________________________________ [place] Code: 1200  Follow up 4 weeks.  ___________________________________________________________ [place] Code: 1300  Take all medication as prescribed the day of your appointment. Bring all medications with you to your appointment.  Do the following things EVERYDAY: 1) Weigh yourself in the morning before breakfast. Write it down and keep it in a log. 2) Take your medicines as prescribed 3) Eat low salt foods-Limit salt (sodium) to 2000 mg per day.  4) Stay as active as you can everyday 5) Limit all fluids for the day to less than 2 liters

## 2018-01-09 ENCOUNTER — Telehealth: Payer: Self-pay | Admitting: Licensed Clinical Social Worker

## 2018-01-09 NOTE — Telephone Encounter (Signed)
CSW received referral to assist patient with resources. Patient is known to CSW from previous visit and is working on [employer]. CSW attempted to reach patient to follow up on needs. Message left for return call. Lasandra Beech, LCSW, CCSW-MCS [phone]

## 2018-01-12 ENCOUNTER — Other Ambulatory Visit (HOSPITAL_COMMUNITY): Payer: Self-pay

## 2018-01-26 ENCOUNTER — Telehealth (HOSPITAL_COMMUNITY): Payer: Self-pay

## 2018-01-26 NOTE — Telephone Encounter (Signed)
I called Mr Mark [name] to see if I could come see him this afternoon. He stated he was not home but would be able to meet early next week. I will call him on Tuesday to schedule an appointment.

## 2018-01-31 ENCOUNTER — Telehealth (HOSPITAL_COMMUNITY): Payer: Self-pay

## 2018-01-31 NOTE — Telephone Encounter (Signed)
I called Mark [name] to schedule an appointment. He did not answer and there was no voicemail available to leave a message.

## 2018-02-01 ENCOUNTER — Telehealth (HOSPITAL_COMMUNITY): Payer: Self-pay

## 2018-02-01 NOTE — Progress Notes (Signed)
Advanced Heart Failure Clinic Note    Referring Physician: Dr Delton See Primary Care: [facility] health and wellness.  HF: Dr [name]  HPI:  Mark [name] is a 42 y.o. male with h/o asthma, polysubstance use, HTN, CVA, and systolic HF.   Presented to ER in 5/17 with respiratory failure. Found to have new-onset systolic HF. EF 20-25%. Cath at that time with normal cors.  RV: 46/19 PA: 45/25 (33) PW: 22 AO: 98/74 LV: 97/12/29 Oxygen saturation: AO 91%; PA 41% Cardiac output: 3.13 (Fick); 5.6 L/m (thermodilution) Cardiac index: 1.53 2.75 liters per minute per meter squared  Shortly after leaving the hospital was incarcerated for 14 months until 8/18. Was on HF meds in prison and did relatively well. Readmitted in 1/19 with recurrent HF. UDS + for cocaine.  LVEF 20%. Diuresed and sent home. Readmitted 09/28/17 for recurrent respiratory failure requiring BiPAP and acute encephaolapthy. Found to have dense left hemiparesis. MRI revealed acute right MCA CVA. CIR recommended but patient left AMA.   Repeat echo 09/28/17 EF 10-15%.   Admitted 3/21 - 11/14/17 with SOB and CP. Troponin mildly elevated. No EKG changes. BNP 3000. Diuresed with IV lasix. UDS negative this admit.  He refused to take his medications at several intervals during this admit.  Dr. Delton See spoke with Dr. [name]. HF follow up arranged.   He presents today for regular HF follow up. Last visit spiro was increased. He has been dealing with the death of his cousin over the past week and has been stressed. SOB has improved. He denies SOB walking around grocery store. Does not do steps. Denies orthopnea, PND, edema. Denies dizziness or CP. Energy and appetite have been good. He has been taking all medications, but ran out of meds the last two days. He may have been taking carvedilol on accident. Referred to HF paramedicine, but has not been returning phone calls due to family death. Medications provided through HF fund. He has been  compliant with medications, but has been out of several x2 days. Weights stable ~147 lbs at home. He is limiting his fluid and salt intake.   Review of systems complete and found to be negative unless listed in HPI.   Past Medical History:  Diagnosis Date  . Asthma   . Chronic systolic CHF (congestive heart failure) (HCC)   . Cigarette smoker   . CKD (chronic kidney disease), stage II    [name] 10/01/2017  . History of echocardiogram    a. Echo 5/17 - EF 20-25%, severe diff HK, restrictive physiology, mild to mod MR, severe reduced RVSF, mod RVE, mild RAE, mod TR, PASP 48 mmHg  . Hx of cardiac cath    a. LHC 5/17 - normal coronary arteries. PA 45/25, mean 33, PCWP mean 18  . NICM (nonischemic cardiomyopathy) (HCC)   . Stroke (HCC) 09/27/2017   "was weak on my left side; I'm fully recovered" (11/09/2017)  . Substance abuse (HCC)    cocaine, marijuana    Current Outpatient Medications  Medication Sig Dispense Refill  . aspirin 81 MG EC tablet Take 1 tablet (81 mg total) by mouth daily. 30 tablet 4  . digoxin (LANOXIN) 0.125 MG tablet Take 1 tablet (0.125 mg total) by mouth daily. 30 tablet 3  . furosemide (LASIX) 80 MG tablet Take 0.5 tablets (40 mg total) by mouth daily. 30 tablet 5  . losartan (COZAAR) 25 MG tablet Take 1 tablet (25 mg total) by mouth at bedtime. 30 tablet 3  . Multiple  Vitamin (MULTIVITAMIN WITH MINERALS) TABS tablet Take 1 tablet by mouth daily.    Marland Kitchen spironolactone (ALDACTONE) 25 MG tablet Take 1 tablet (25 mg total) by mouth daily. 30 tablet 11  . albuterol (PROVENTIL HFA;VENTOLIN HFA) 108 (90 Base) MCG/ACT inhaler Inhale 1-2 puffs into the lungs every 4 (four) hours as needed for wheezing or shortness of breath. (Patient not taking: Reported on 02/02/2018) 1 Inhaler 4  . budesonide-formoterol (SYMBICORT) 80-4.5 MCG/ACT inhaler Inhale 2 puffs into the lungs 2 (two) times daily. (Patient not taking: Reported on 02/02/2018) 1 Inhaler 3  . mometasone-formoterol (DULERA)  100-5 MCG/ACT AERO Inhale 2 puffs into the lungs 2 (two) times daily.     No current facility-administered medications for this encounter.     Allergies  Allergen Reactions  . Hydrocodone Hives  . Lisinopril Swelling and Other (See Comments)    Facial swelling/angioedema  . Prednisone Shortness Of Breath, Nausea Only, Swelling and Other (See Comments)    Also made chest feel tight and genital area, legs, and face became swollen badly  . Penicillins Hives and Swelling     Has patient had a PCN reaction causing immediate rash, facial/tongue/throat swelling, SOB or lightheadedness with hypotension: Yes Has patient had a PCN reaction causing severe rash involving mucus membranes or skin necrosis: No Has patient had a PCN reaction that required hospitalization: No Has patient had a PCN reaction occurring within the last 10 years: No If all of the above answers are "NO", then may proceed with Cephalosporin use.       Social History   Socioeconomic History  . Marital status: Divorced    Spouse name: Not on file  . Number of children: Not on file  . Years of education: Not on file  . Highest education level: Not on file  Occupational History  . Not on file  Social Needs  . Financial resource strain: Not on file  . Food insecurity:    Worry: Not on file    Inability: Not on file  . Transportation needs:    Medical: Not on file    Non-medical: Not on file  Tobacco Use  . Smoking status: Former Smoker    Packs/day: 1.00    Years: 30.00    Pack years: 30.00    Types: Cigarettes    Last attempt to quit: 09/27/2017    Years since quitting: 0.3  . Smokeless tobacco: Never Used  Substance and Sexual Activity  . Alcohol use: Yes    Alcohol/week: 3.0 oz    Types: 5 Shots of liquor per week    Frequency: Never    Comment: 5-6 shots of vodka daily; "stopped it all after I had stroke 09/27/2017"  . Drug use: Yes    Types: Cocaine, Marijuana    Comment: 11/09/2017 "none since 09/27/2017"   . Sexual activity: Not Currently  Lifestyle  . Physical activity:    Days per week: Not on file    Minutes per session: Not on file  . Stress: Not on file  Relationships  . Social connections:    Talks on phone: Not on file    Gets together: Not on file    Attends religious service: Not on file    Active member of club or organization: Not on file    Attends meetings of clubs or organizations: Not on file    Relationship status: Not on file  . Intimate partner violence:    Fear of current or ex partner: Not on  file    Emotionally abused: Not on file    Physically abused: Not on file    Forced sexual activity: Not on file  Other Topics Concern  . Not on file  Social History Narrative  . Not on file      Family History  Problem Relation Age of Onset  . Cardiomyopathy Father        Reports his father has an LVAD  . Heart failure Father   . Hypertension Father   . Deep vein thrombosis Neg Hx    Vitals:   02/02/18 0921  BP: 92/68  Pulse: 82  SpO2: 98%  Weight: 154 lb (69.9 kg)    Wt Readings from Last 3 Encounters:  02/02/18 154 lb (69.9 kg)  01/05/18 150 lb 12.8 oz (68.4 kg)  12/29/17 146 lb (66.2 kg)   PHYSICAL EXAM: General: No resp difficulty. HEENT: Normal Neck: Supple. JVP flat. Carotids 2+ bilat; no bruits. No thyromegaly or nodule noted. Cor: PMI laterally displaced. RRR, soft S3 Lungs: CTAB, normal effort. Abdomen: Soft, non-tender, non-distended, no HSM. No bruits or masses. +BS  Extremities: No cyanosis, clubbing, or rash. R and LLE no edema.  Neuro: Alert & orientedx3, cranial nerves grossly intact. moves all 4 extremities w/o difficulty. Affect pleasant  EKG: NSR 82 bpm  ASSESSMENT & PLAN:  1. Chronic systolic CHF, Echo 09/2017 LVEF 10-15% - NICM by cath 2017. Suspect etiology is polysubstance abuse - NYHA II - Volume status stable on exam.  - Continue lasix 40 mg daily.  - Continue losartan 25 mg daily - Continue digoxin 0.125 mg daily -  Continue spiro 25 mg daily.  - He thinks he may have been taking his carvedilol (brought in an old bottle dated 09/2017). Told him to stop taking with probable low output. Original HR reading 40, but EKG shows 82 bpm.  - Continue paramedicine. Ian Malkin will check on him next week - He has been counseled about possible need for advanced therapies down the road and need to remain off substances - Reinforced fluid restriction to < 2 L daily, sodium restriction to less than 2000 mg daily, and the importance of daily weights.   - Set up for repeat Echo 6-8 weeks with Dr [name] now that he has been compliant with medications. Discussed possible ICD if EF remains < 35%.  - Set up CPX  2. Chest pain - During recent admission. Denies any further. - Cardiac cath 2017. No CAD - On ASA 81 mg daily for CVA - Denies s/s ischemia.   3. Polysubstance abuse - UDS was negative recent admission. Has quit cold [place] after CVA.  - He has been counseled about possible need for advanced therapies down the road and need to remain off substances - Denies cocaine use.  - He has had a few beers and cigarettes the last week with family stress. Encouraged cessation.   4. 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 There is associated slow/absent flow within the associated right M2 branch. - No lasting deficit or recurrent symptoms except that his LUE feels cold at times. - He is on ASA and stain alone with poor prognosis.  - Likely cardio-embolic. Ideally would be on warfarin but not compliant enough at this point. Reconsider in future if compliance improves.  - Missed his follow up apointment with neurology. Encouraged him to follow up.   Refill meds BMET today No BP room to  adjust medications.  Repeat echo in 6-8 weeks with Dr [name]  Set up CPX  Alford Highland, NP  9:54 AM  Greater than 50% of the 25 minute visit was spent in  counseling/coordination of care regarding disease state education, salt/fluid restriction, sliding scale diuretics, and medication compliance.

## 2018-02-01 NOTE — Telephone Encounter (Signed)
I called Mark [name] to schedule and appointment. He did not answer and not voicemail was available to leave a message.

## 2018-02-02 ENCOUNTER — Other Ambulatory Visit (HOSPITAL_COMMUNITY): Payer: Self-pay

## 2018-02-02 ENCOUNTER — Ambulatory Visit (HOSPITAL_COMMUNITY)
Admission: RE | Admit: 2018-02-02 | Discharge: 2018-02-02 | Disposition: A | Discharge status: Home / Self Care | Payer: Self-pay | Source: Ambulatory Visit | Attending: Cardiology | Admitting: Cardiology

## 2018-02-02 VITALS — BP 92/68 | HR 82 | Wt 154.0 lb

## 2018-02-02 DIAGNOSIS — R079 Chest pain, unspecified: Secondary | ICD-10-CM | POA: Insufficient documentation

## 2018-02-02 DIAGNOSIS — J45909 Unspecified asthma, uncomplicated: Secondary | ICD-10-CM | POA: Insufficient documentation

## 2018-02-02 DIAGNOSIS — I130 Hypertensive heart and chronic kidney disease with heart failure and stage 1 through stage 4 chronic kidney disease, or unspecified chronic kidney disease: Secondary | ICD-10-CM | POA: Insufficient documentation

## 2018-02-02 DIAGNOSIS — F1411 Cocaine abuse, in remission: Secondary | ICD-10-CM

## 2018-02-02 DIAGNOSIS — I428 Other cardiomyopathies: Secondary | ICD-10-CM

## 2018-02-02 DIAGNOSIS — I5022 Chronic systolic (congestive) heart failure: Secondary | ICD-10-CM | POA: Insufficient documentation

## 2018-02-02 DIAGNOSIS — Z87891 Personal history of nicotine dependence: Secondary | ICD-10-CM | POA: Insufficient documentation

## 2018-02-02 DIAGNOSIS — Z79899 Other long term (current) drug therapy: Secondary | ICD-10-CM | POA: Insufficient documentation

## 2018-02-02 DIAGNOSIS — Z7901 Long term (current) use of anticoagulants: Secondary | ICD-10-CM | POA: Insufficient documentation

## 2018-02-02 DIAGNOSIS — Z888 Allergy status to other drugs, medicaments and biological substances status: Secondary | ICD-10-CM | POA: Insufficient documentation

## 2018-02-02 DIAGNOSIS — Z880 Allergy status to penicillin: Secondary | ICD-10-CM | POA: Insufficient documentation

## 2018-02-02 DIAGNOSIS — Z8673 Personal history of transient ischemic attack (TIA), and cerebral infarction without residual deficits: Secondary | ICD-10-CM | POA: Insufficient documentation

## 2018-02-02 DIAGNOSIS — Z7982 Long term (current) use of aspirin: Secondary | ICD-10-CM | POA: Insufficient documentation

## 2018-02-02 DIAGNOSIS — Z87898 Personal history of other specified conditions: Secondary | ICD-10-CM

## 2018-02-02 DIAGNOSIS — I429 Cardiomyopathy, unspecified: Secondary | ICD-10-CM | POA: Insufficient documentation

## 2018-02-02 DIAGNOSIS — N182 Chronic kidney disease, stage 2 (mild): Secondary | ICD-10-CM | POA: Insufficient documentation

## 2018-02-02 DIAGNOSIS — Z885 Allergy status to narcotic agent status: Secondary | ICD-10-CM | POA: Insufficient documentation

## 2018-02-02 DIAGNOSIS — Z8249 Family history of ischemic heart disease and other diseases of the circulatory system: Secondary | ICD-10-CM | POA: Insufficient documentation

## 2018-02-02 LAB — BASIC METABOLIC PANEL
Anion gap: 6 (ref 5–15)
BUN: 10 mg/dL (ref 6–20)
CO2: 30 mmol/L (ref 22–32)
CREATININE: 1.12 mg/dL (ref 0.61–1.24)
Calcium: 9.4 mg/dL (ref 8.9–10.3)
Chloride: 102 mmol/L (ref 101–111)
Glucose, Bld: 103 mg/dL — ABNORMAL HIGH (ref 65–99)
POTASSIUM: 3.7 mmol/L (ref 3.5–5.1)
SODIUM: 138 mmol/L (ref 135–145)

## 2018-02-02 NOTE — Patient Instructions (Signed)
Routine lab work today. Will notify you of abnormal results, otherwise no news is good news!  Will schedule you for Cardiopulmonary Exercise Test. This test is done at our Heart Failure Clinic. Please wear comfortable clothes and shoes for this test. Avoid heavy meal before the test (light snack/meal recommended). Avoid caffeine, alcohol, tobacco products 12 hrs before test. Please give 24 hr notice for cancellations/rescheduling: [phone].  Follow up 6-8 weeks with Dr. [name] and echocardiogram.  __________________________________________________________________ [place] Code: 1400  Take all medication as prescribed the day of your appointment. Bring all medications with you to your appointment.  Do the following things EVERYDAY: 1) Weigh yourself in the morning before breakfast. Write it down and keep it in a log. 2) Take your medicines as prescribed 3) Eat low salt foods-Limit salt (sodium) to 2000 mg per day.  4) Stay as active as you can everyday 5) Limit all fluids for the day to less than 2 liters

## 2018-02-02 NOTE — Progress Notes (Signed)
Paramedicine Encounter   Patient ID: Mark [name] , male,   DOB: 01/08/1976,41 y.o.,  MRN: [id number]   [name] was seen in the HF clinic today with Morrie Sheldon, NP. He reported feeling generally well and said he had a lot going on in his personal life and out of town which is why we have not been able to meet. He said that next week he hopes to be back home and we will be able to meet then. I asked that he call me when he gets home and if it looks like he wont be home next week let me know and we can meet in [facility]. He agreed to this. No medication changes were made due to hypotension during the time of his visit.   Jacqualine Code, EMT 02/02/2018   ACTION: Next visit planned for 1 week

## 2018-02-14 ENCOUNTER — Encounter (HOSPITAL_COMMUNITY): Payer: Self-pay | Admitting: *Deleted

## 2018-02-14 ENCOUNTER — Encounter (HOSPITAL_COMMUNITY): Payer: Self-pay

## 2018-02-14 ENCOUNTER — Other Ambulatory Visit (HOSPITAL_COMMUNITY): Payer: Self-pay

## 2018-02-14 MED FILL — SPIRONOLACTONE 25 MG TABLET: 25 | 30 days supply | Qty: 15 | Fill #2

## 2018-02-14 MED FILL — LOSARTAN POTASSIUM 25 MG TA: 25 | 30 days supply | Qty: 30 | Fill #1

## 2018-02-14 MED FILL — DIGOXIN 0.125 MG TABLET: 125 | 30 days supply | Qty: 30 | Fill #1

## 2018-02-14 MED FILL — FUROSEMIDE 80 MG TABLET: 80 | 30 days supply | Qty: 15 | Fill #2

## 2018-02-14 NOTE — Progress Notes (Signed)
Received medical records request from [facility],CASE # [id number]  Requested records faxed today to [phone].  Original request will be scanned to patient's electronic medical record.

## 2018-02-14 NOTE — Progress Notes (Signed)
Paramedicine Encounter    Patient ID: Mark [name], male    DOB: [birth year], 42 y.o.   MRN: [id number]    Patient Care Team: Massie Maroon, FNP as PCP - General (Family Medicine) Kathleene Hazel, MD as PCP - Cardiology (Cardiology)  Patient Active Problem List   Diagnosis Date Noted  . Noncompliance   . Chronic anemia   . Acute systolic CHF (congestive heart failure) (HCC) 11/09/2017  . History of right MCA stroke 09/28/2017  . Stroke (HCC) 09/28/2017  . Chronic systolic heart failure (HCC) 01/11/2016  . NICM (nonischemic cardiomyopathy) (HCC) 01/11/2016  . CKD (chronic kidney disease) stage 2, GFR 60-89 ml/min 01/11/2016  . Cocaine abuse (HCC) 01/11/2016  . Chest pain, pleuritic 01/03/2016  . Tobacco abuse 01/03/2016  . Asthma 01/03/2016  . Leg swelling 01/03/2016  . Tachycardia 01/03/2016  . Normocytic anemia 01/03/2016  . Elevated troponin I level 01/03/2016  . Right rib fracture 01/03/2016  . Chest pain     Current Outpatient Medications:  .  aspirin 81 MG EC tablet, Take 1 tablet (81 mg total) by mouth daily., Disp: 30 tablet, Rfl: 4 .  digoxin (LANOXIN) 0.125 MG tablet, Take 1 tablet (0.125 mg total) by mouth daily., Disp: 30 tablet, Rfl: 3 .  furosemide (LASIX) 80 MG tablet, Take 0.5 tablets (40 mg total) by mouth daily., Disp: 30 tablet, Rfl: 5 .  losartan (COZAAR) 25 MG tablet, Take 1 tablet (25 mg total) by mouth at bedtime., Disp: 30 tablet, Rfl: 3 .  Multiple Vitamin (MULTIVITAMIN WITH MINERALS) TABS tablet, Take 1 tablet by mouth daily., Disp: , Rfl:  .  spironolactone (ALDACTONE) 25 MG tablet, Take 1 tablet (25 mg total) by mouth daily., Disp: 30 tablet, Rfl: 11 .  albuterol (PROVENTIL HFA;VENTOLIN HFA) 108 (90 Base) MCG/ACT inhaler, Inhale 1-2 puffs into the lungs every 4 (four) hours as needed for wheezing or shortness of breath. (Patient not taking: Reported on 02/02/2018), Disp: 1 Inhaler, Rfl: 4 .  budesonide-formoterol (SYMBICORT) 80-4.5 MCG/ACT  inhaler, Inhale 2 puffs into the lungs 2 (two) times daily. (Patient not taking: Reported on 02/02/2018), Disp: 1 Inhaler, Rfl: 3 .  mometasone-formoterol (DULERA) 100-5 MCG/ACT AERO, Inhale 2 puffs into the lungs 2 (two) times daily., Disp: , Rfl:  Allergies  Allergen Reactions  . Hydrocodone Hives  . Lisinopril Swelling and Other (See Comments)    Facial swelling/angioedema  . Prednisone Shortness Of Breath, Nausea Only, Swelling and Other (See Comments)    Also made chest feel tight and genital area, legs, and face became swollen badly  . Penicillins Hives and Swelling     Has patient had a PCN reaction causing immediate rash, facial/tongue/throat swelling, SOB or lightheadedness with hypotension: Yes Has patient had a PCN reaction causing severe rash involving mucus membranes or skin necrosis: No Has patient had a PCN reaction that required hospitalization: No Has patient had a PCN reaction occurring within the last 10 years: No If all of the above answers are "NO", then may proceed with Cephalosporin use.      Social History   Socioeconomic History  . Marital status: Divorced    Spouse name: Not on file  . Number of children: Not on file  . Years of education: Not on file  . Highest education level: Not on file  Occupational History  . Not on file  Social Needs  . Financial resource strain: Not on file  . Food insecurity:    Worry: Not on file  Inability: Not on file  . Transportation needs:    Medical: Not on file    Non-medical: Not on file  Tobacco Use  . Smoking status: Former Smoker    Packs/day: 1.00    Years: 30.00    Pack years: 30.00    Types: Cigarettes    Last attempt to quit: 09/27/2017    Years since quitting: 0.3  . Smokeless tobacco: Never Used  Substance and Sexual Activity  . Alcohol use: Yes    Alcohol/week: 3.0 oz    Types: 5 Shots of liquor per week    Frequency: Never    Comment: 5-6 shots of vodka daily; "stopped it all after I had stroke  09/27/2017"  . Drug use: Yes    Types: Cocaine, Marijuana    Comment: 11/09/2017 "none since 09/27/2017"  . Sexual activity: Not Currently  Lifestyle  . Physical activity:    Days per week: Not on file    Minutes per session: Not on file  . Stress: Not on file  Relationships  . Social connections:    Talks on phone: Not on file    Gets together: Not on file    Attends religious service: Not on file    Active member of club or organization: Not on file    Attends meetings of clubs or organizations: Not on file    Relationship status: Not on file  . Intimate partner violence:    Fear of current or ex partner: Not on file    Emotionally abused: Not on file    Physically abused: Not on file    Forced sexual activity: Not on file  Other Topics Concern  . Not on file  Social History Narrative  . Not on file    Physical Exam  Pulmonary/Chest: Effort normal. No respiratory distress. He has no wheezes. He has no rales.  Abdominal: Soft. There is no tenderness. There is no guarding.  Musculoskeletal: He exhibits no edema.  Skin: Skin is warm and dry.        Future Appointments  Date Time Provider Department Center  03/09/2018  9:00 AM [facility] LAB [facility]  05/04/2018 10:00 AM MC ECHO 1-BUZZ [facility]ECHOL[facility]  05/04/2018 11:20 AM Bensimhon, Bevelyn Buckles, MD MC-[facility] None    ATF pt CAO x4; just woke up from a nap.  Pt has been out of his meds for a couple of weeks. [facility] and [facility] them to him.  Upon my arrival pt still has losartan left in the evening slot.  Pt stated that he also is out of his inhaler but he's been using the nebulizer when needed.  Pt has no complaints.  rx bottles verified and pill box refilled.  BP 94/60 (BP Location: Left Arm, Patient Position: Sitting, Cuff Size: Normal)   Pulse 70   Resp 16   Wt 145 lb (65.8 kg)   SpO2 94%   BMI 21.73 kg/m     Marene Gilliam, EMT Paramedic 02/14/2018    ACTION: Home visit  completed

## 2018-02-23 ENCOUNTER — Other Ambulatory Visit (HOSPITAL_COMMUNITY): Payer: Self-pay

## 2018-02-23 NOTE — Progress Notes (Signed)
Paramedicine Encounter    Patient ID: Mark [name], male    DOB: 03-10-1976, 42 y.o.   MRN: [id number]    Patient Care Team: Massie Maroon, FNP as PCP - General (Family Medicine) Kathleene Hazel, MD as PCP - Cardiology (Cardiology)  Patient Active Problem List   Diagnosis Date Noted  . Noncompliance   . Chronic anemia   . Acute systolic CHF (congestive heart failure) (HCC) 11/09/2017  . History of right MCA stroke 09/28/2017  . Stroke (HCC) 09/28/2017  . Chronic systolic heart failure (HCC) 01/11/2016  . NICM (nonischemic cardiomyopathy) (HCC) 01/11/2016  . CKD (chronic kidney disease) stage 2, GFR 60-89 ml/min 01/11/2016  . Cocaine abuse (HCC) 01/11/2016  . Chest pain, pleuritic 01/03/2016  . Tobacco abuse 01/03/2016  . Asthma 01/03/2016  . Leg swelling 01/03/2016  . Tachycardia 01/03/2016  . Normocytic anemia 01/03/2016  . Elevated troponin I level 01/03/2016  . Right rib fracture 01/03/2016  . Chest pain     Current Outpatient Medications:  .  aspirin 81 MG EC tablet, Take 1 tablet (81 mg total) by mouth daily., Disp: 30 tablet, Rfl: 4 .  digoxin (LANOXIN) 0.125 MG tablet, Take 1 tablet (0.125 mg total) by mouth daily., Disp: 30 tablet, Rfl: 3 .  furosemide (LASIX) 80 MG tablet, Take 0.5 tablets (40 mg total) by mouth daily., Disp: 30 tablet, Rfl: 5 .  losartan (COZAAR) 25 MG tablet, Take 1 tablet (25 mg total) by mouth at bedtime., Disp: 30 tablet, Rfl: 3 .  spironolactone (ALDACTONE) 25 MG tablet, Take 1 tablet (25 mg total) by mouth daily., Disp: 30 tablet, Rfl: 11 .  albuterol (PROVENTIL HFA;VENTOLIN HFA) 108 (90 Base) MCG/ACT inhaler, Inhale 1-2 puffs into the lungs every 4 (four) hours as needed for wheezing or shortness of breath. (Patient not taking: Reported on 02/02/2018), Disp: 1 Inhaler, Rfl: 4 .  budesonide-formoterol (SYMBICORT) 80-4.5 MCG/ACT inhaler, Inhale 2 puffs into the lungs 2 (two) times daily. (Patient not taking: Reported on 02/02/2018),  Disp: 1 Inhaler, Rfl: 3 .  mometasone-formoterol (DULERA) 100-5 MCG/ACT AERO, Inhale 2 puffs into the lungs 2 (two) times daily., Disp: , Rfl:  .  Multiple Vitamin (MULTIVITAMIN WITH MINERALS) TABS tablet, Take 1 tablet by mouth daily., Disp: , Rfl:  Allergies  Allergen Reactions  . Hydrocodone Hives  . Lisinopril Swelling and Other (See Comments)    Facial swelling/angioedema  . Prednisone Shortness Of Breath, Nausea Only, Swelling and Other (See Comments)    Also made chest feel tight and genital area, legs, and face became swollen badly  . Penicillins Hives and Swelling     Has patient had a PCN reaction causing immediate rash, facial/tongue/throat swelling, SOB or lightheadedness with hypotension: Yes Has patient had a PCN reaction causing severe rash involving mucus membranes or skin necrosis: No Has patient had a PCN reaction that required hospitalization: No Has patient had a PCN reaction occurring within the last 10 years: No If all of the above answers are "NO", then may proceed with Cephalosporin use.      Social History   Socioeconomic History  . Marital status: Divorced    Spouse name: Not on file  . Number of children: Not on file  . Years of education: Not on file  . Highest education level: Not on file  Occupational History  . Not on file  Social Needs  . Financial resource strain: Not on file  . Food insecurity:    Worry: Not on file  Inability: Not on file  . Transportation needs:    Medical: Not on file    Non-medical: Not on file  Tobacco Use  . Smoking status: Former Smoker    Packs/day: 1.00    Years: 30.00    Pack years: 30.00    Types: Cigarettes    Last attempt to quit: 09/27/2017    Years since quitting: 0.4  . Smokeless tobacco: Never Used  Substance and Sexual Activity  . Alcohol use: Yes    Alcohol/week: 3.0 oz    Types: 5 Shots of liquor per week    Frequency: Never    Comment: 5-6 shots of vodka daily; "stopped it all after I had  stroke 09/27/2017"  . Drug use: Yes    Types: Cocaine, Marijuana    Comment: 11/09/2017 "none since 09/27/2017"  . Sexual activity: Not Currently  Lifestyle  . Physical activity:    Days per week: Not on file    Minutes per session: Not on file  . Stress: Not on file  Relationships  . Social connections:    Talks on phone: Not on file    Gets together: Not on file    Attends religious service: Not on file    Active member of club or organization: Not on file    Attends meetings of clubs or organizations: Not on file    Relationship status: Not on file  . Intimate partner violence:    Fear of current or ex partner: Not on file    Emotionally abused: Not on file    Physically abused: Not on file    Forced sexual activity: Not on file  Other Topics Concern  . Not on file  Social History Narrative  . Not on file    Physical Exam  Pulmonary/Chest: Effort normal. No respiratory distress.  Abdominal: Soft. He exhibits no distension.  Musculoskeletal: He exhibits no edema.  Skin: Skin is warm and dry. He is not diaphoretic.        Future Appointments  Date Time Provider Department Center  03/15/2018  9:00 AM MC-CPX LAB MC-CPX [facility]  05/04/2018 10:00 AM MC ECHO 1-BUZZ MC[facility]ECHOL[facility]  05/04/2018 11:20 AM Bensimhon, Bevelyn Buckles, MD MC-[facility] None    ATF pt CAO x4 sitting on the couch, cleaning prior to my arrival. Pt stated that he walked to the store at the corner twice this am w/o becoming sob.  Pt stated that he hasn't had any sob, chest pain or dizziness.  Pt has taken all of his meds without missing.  He was given asa from the heart failure clinic.  Pt does have a PCP with the sickle cell clinic; he hasn't been in several months.  Pt was encouraged to schedule a visit, so that he will not be taken off of the pt list.  Pt's vitals noted.  rx bottles verified.  Pt is concerned about taking the  Losartan, because he thinks that its breaking his face out;the scabs are tender to touch.      BP 110/88 (BP Location: Left Arm, Patient Position: Sitting, Cuff Size: Normal)   Pulse 98   Resp 16   Wt 144 lb (65.3 kg)   SpO2 98%   BMI 21.58 kg/m   Weight yesterday-144 Last visit weight-145    Keasia Dubose, EMT Paramedic 02/23/2018    ACTION: Home visit completed

## 2018-03-08 ENCOUNTER — Other Ambulatory Visit (HOSPITAL_COMMUNITY): Payer: Self-pay

## 2018-03-08 MED FILL — FUROSEMIDE 80 MG TABLET: 80 | 30 days supply | Qty: 15 | Fill #3

## 2018-03-08 MED FILL — DIGOXIN 0.125 MG TABLET: 125 | 30 days supply | Qty: 30 | Fill #2

## 2018-03-08 MED FILL — SPIRONOLACTONE 25 MG TABLET: 25 | 30 days supply | Qty: 15 | Fill #3

## 2018-03-08 MED FILL — LOSARTAN POTASSIUM 25 MG TA: 25 | 30 days supply | Qty: 30 | Fill #2

## 2018-03-08 NOTE — Progress Notes (Signed)
Paramedicine Encounter    Patient ID: Mark [name], male    DOB: 1975/11/03, 42 y.o.   MRN: [id number]    Patient Care Team: Massie Maroon, FNP as PCP - General (Family Medicine) Kathleene Hazel, MD as PCP - Cardiology (Cardiology)  Patient Active Problem List   Diagnosis Date Noted  . Noncompliance   . Chronic anemia   . Acute systolic CHF (congestive heart failure) (HCC) 11/09/2017  . History of right MCA stroke 09/28/2017  . Stroke (HCC) 09/28/2017  . Chronic systolic heart failure (HCC) 01/11/2016  . NICM (nonischemic cardiomyopathy) (HCC) 01/11/2016  . CKD (chronic kidney disease) stage 2, GFR 60-89 ml/min 01/11/2016  . Cocaine abuse (HCC) 01/11/2016  . Chest pain, pleuritic 01/03/2016  . Tobacco abuse 01/03/2016  . Asthma 01/03/2016  . Leg swelling 01/03/2016  . Tachycardia 01/03/2016  . Normocytic anemia 01/03/2016  . Elevated troponin I level 01/03/2016  . Right rib fracture 01/03/2016  . Chest pain     Current Outpatient Medications:  .  aspirin 81 MG EC tablet, Take 1 tablet (81 mg total) by mouth daily., Disp: 30 tablet, Rfl: 4 .  digoxin (LANOXIN) 0.125 MG tablet, Take 1 tablet (0.125 mg total) by mouth daily., Disp: 30 tablet, Rfl: 3 .  furosemide (LASIX) 80 MG tablet, Take 0.5 tablets (40 mg total) by mouth daily., Disp: 30 tablet, Rfl: 5 .  Multiple Vitamin (MULTIVITAMIN WITH MINERALS) TABS tablet, Take 1 tablet by mouth daily., Disp: , Rfl:  .  spironolactone (ALDACTONE) 25 MG tablet, Take 1 tablet (25 mg total) by mouth daily., Disp: 30 tablet, Rfl: 11 .  albuterol (PROVENTIL HFA;VENTOLIN HFA) 108 (90 Base) MCG/ACT inhaler, Inhale 1-2 puffs into the lungs every 4 (four) hours as needed for wheezing or shortness of breath. (Patient not taking: Reported on 02/02/2018), Disp: 1 Inhaler, Rfl: 4 .  budesonide-formoterol (SYMBICORT) 80-4.5 MCG/ACT inhaler, Inhale 2 puffs into the lungs 2 (two) times daily. (Patient not taking: Reported on 02/02/2018),  Disp: 1 Inhaler, Rfl: 3 .  losartan (COZAAR) 25 MG tablet, Take 1 tablet (25 mg total) by mouth at bedtime., Disp: 30 tablet, Rfl: 3 .  mometasone-formoterol (DULERA) 100-5 MCG/ACT AERO, Inhale 2 puffs into the lungs 2 (two) times daily., Disp: , Rfl:  Allergies  Allergen Reactions  . Hydrocodone Hives  . Lisinopril Swelling and Other (See Comments)    Facial swelling/angioedema  . Prednisone Shortness Of Breath, Nausea Only, Swelling and Other (See Comments)    Also made chest feel tight and genital area, legs, and face became swollen badly  . Penicillins Hives and Swelling     Has patient had a PCN reaction causing immediate rash, facial/tongue/throat swelling, SOB or lightheadedness with hypotension: Yes Has patient had a PCN reaction causing severe rash involving mucus membranes or skin necrosis: No Has patient had a PCN reaction that required hospitalization: No Has patient had a PCN reaction occurring within the last 10 years: No If all of the above answers are "NO", then may proceed with Cephalosporin use.      Social History   Socioeconomic History  . Marital status: Divorced    Spouse name: Not on file  . Number of children: Not on file  . Years of education: Not on file  . Highest education level: Not on file  Occupational History  . Not on file  Social Needs  . Financial resource strain: Not on file  . Food insecurity:    Worry: Not on file  Inability: Not on file  . Transportation needs:    Medical: Not on file    Non-medical: Not on file  Tobacco Use  . Smoking status: Former Smoker    Packs/day: 1.00    Years: 30.00    Pack years: 30.00    Types: Cigarettes    Last attempt to quit: 09/27/2017    Years since quitting: 0.4  . Smokeless tobacco: Never Used  Substance and Sexual Activity  . Alcohol use: Yes    Alcohol/week: 3.0 oz    Types: 5 Shots of liquor per week    Frequency: Never    Comment: 5-6 shots of vodka daily; "stopped it all after I had  stroke 09/27/2017"  . Drug use: Yes    Types: Cocaine, Marijuana    Comment: 11/09/2017 "none since 09/27/2017"  . Sexual activity: Not Currently  Lifestyle  . Physical activity:    Days per week: Not on file    Minutes per session: Not on file  . Stress: Not on file  Relationships  . Social connections:    Talks on phone: Not on file    Gets together: Not on file    Attends religious service: Not on file    Active member of club or organization: Not on file    Attends meetings of clubs or organizations: Not on file    Relationship status: Not on file  . Intimate partner violence:    Fear of current or ex partner: Not on file    Emotionally abused: Not on file    Physically abused: Not on file    Forced sexual activity: Not on file  Other Topics Concern  . Not on file  Social History Narrative  . Not on file    Physical Exam  Pulmonary/Chest: Effort normal. No respiratory distress. He has no wheezes. He has no rales.  Abdominal: Soft. He exhibits no distension.  Musculoskeletal: He exhibits no edema.  Skin: Skin is warm and dry. He is not diaphoretic.        Future Appointments  Date Time Provider Department Center  03/15/2018  9:00 AM MC-CPX LAB MC-CPX [facility]  05/04/2018 10:00 AM MC ECHO 1-BUZZ [facility]OL[facility]  05/04/2018 11:20 AM Bensimhon, Bevelyn Buckles, MD MC-[facility] None     BP 92/60 (BP Location: Left Arm, Patient Position: Sitting, Cuff Size: Normal)   Pulse 79   Resp 16   Wt 140 lb (63.5 kg)   SpO2 97%   BMI 20.98 kg/m   Weight yesterday-139lb Last visit weight-144  ATF pt CAO x4 standing outside talking. Pt stated that he had sob last night after spraying his room for bugs and dusting.  Pt took a breathing treatment and felt a lot better before he went to bed.  Pt denies sob, chest pain and dizziness today.  Pt has taken all of medications/ he refills his pill box. rx bottles verified.    Pt has transportation issues, therefore I will get his meds from [facility] and bring them back.   Medication ordered:  Losartan Spirolactone Furosemide  Mark [name], EMT Paramedic [phone] 03/08/2018    ACTION: Home visit completed

## 2018-03-08 NOTE — Progress Notes (Signed)
Came to deliver pt's meds/ no answer at the door/ no phone or text back. I will f/u with pt tomorrow.

## 2018-03-09 ENCOUNTER — Encounter (HOSPITAL_COMMUNITY): Payer: Self-pay

## 2018-03-15 ENCOUNTER — Encounter (HOSPITAL_COMMUNITY): Payer: Self-pay

## 2018-03-28 ENCOUNTER — Encounter (HOSPITAL_COMMUNITY): Payer: Self-pay

## 2018-03-30 ENCOUNTER — Telehealth (HOSPITAL_COMMUNITY): Payer: Self-pay

## 2018-03-30 MED FILL — SYMBICORT 80-4.5 MCG INH: 80-4.5 | 30 days supply | Qty: 10 | Fill #1

## 2018-03-30 MED FILL — !VENTOLIN HFA INHALER: 108 (90 BAS | 16 days supply | Qty: 18 | Fill #1

## 2018-03-30 NOTE — Telephone Encounter (Signed)
Pt stated that he couldn't see me this week due to his truck being broke down out of town.  He stated that he felt better but he doesn't have any breathing treatments.  He rescheduled for next week

## 2018-04-03 ENCOUNTER — Telehealth (HOSPITAL_COMMUNITY): Payer: Self-pay

## 2018-04-03 ENCOUNTER — Other Ambulatory Visit (HOSPITAL_COMMUNITY): Payer: Self-pay

## 2018-04-03 NOTE — Progress Notes (Signed)
[facility]

## 2018-04-03 NOTE — Telephone Encounter (Signed)
Pt rescheduled last weeks appointment for this am. No answer to pt's phone and unable to leave a message.

## 2018-04-04 ENCOUNTER — Encounter (HOSPITAL_COMMUNITY): Payer: Self-pay

## 2018-04-06 ENCOUNTER — Encounter (HOSPITAL_COMMUNITY): Payer: Self-pay

## 2018-04-10 ENCOUNTER — Other Ambulatory Visit (HOSPITAL_COMMUNITY): Payer: Self-pay | Admitting: Internal Medicine

## 2018-04-10 ENCOUNTER — Telehealth (HOSPITAL_COMMUNITY): Payer: Self-pay

## 2018-04-10 ENCOUNTER — Ambulatory Visit (HOSPITAL_COMMUNITY): Payer: Self-pay

## 2018-04-10 MED FILL — FUROSEMIDE 80 MG TABLET: 80 | 30 days supply | Qty: 15 | Fill #4

## 2018-04-10 MED FILL — SPIRONOLACTONE 25 MG TABLET: 25 | 30 days supply | Qty: 15 | Fill #0

## 2018-04-10 NOTE — Telephone Encounter (Signed)
Pt text and stated that he is now out of lasix and spirolactone.  He was unable to make the heart failure clinic appointment due to transportation issues.    I advised him to call the clinic to reschedule his appointment.  I went to the out pt pharmacy to order and pick up his refill.

## 2018-04-10 NOTE — Progress Notes (Unsigned)
Due to pt having transportation issues, I picked up pt's heart failure meds from [facility] out pt pharmacy.

## 2018-04-10 NOTE — Telephone Encounter (Signed)
Pt gets meds through [facility], refill for [facility] faxed to [facility] pharmacy

## 2018-04-12 ENCOUNTER — Telehealth (HOSPITAL_COMMUNITY): Payer: Self-pay

## 2018-04-12 NOTE — Telephone Encounter (Signed)
Message left for pt to schedule CH[facility] visit; pt advised that I have his medications that was picked up earlier this week.

## 2018-04-17 ENCOUNTER — Encounter (HOSPITAL_COMMUNITY): Payer: Self-pay

## 2018-04-17 ENCOUNTER — Other Ambulatory Visit (HOSPITAL_COMMUNITY): Payer: Self-pay

## 2018-04-17 NOTE — Progress Notes (Signed)
Paramedicine Encounter    Patient ID: Mark [name], male    DOB: Dec 28, 1975, 42 y.o.   MRN: [id number]    Patient Care Team: Massie Maroon, FNP as PCP - General (Family Medicine) Kathleene Hazel, MD as PCP - Cardiology (Cardiology)  Patient Active Problem List   Diagnosis Date Noted  . Noncompliance   . Chronic anemia   . Acute systolic CHF (congestive heart failure) (HCC) 11/09/2017  . History of right MCA stroke 09/28/2017  . Stroke (HCC) 09/28/2017  . Chronic systolic heart failure (HCC) 01/11/2016  . NICM (nonischemic cardiomyopathy) (HCC) 01/11/2016  . CKD (chronic kidney disease) stage 2, GFR 60-89 ml/min 01/11/2016  . Cocaine abuse (HCC) 01/11/2016  . Chest pain, pleuritic 01/03/2016  . Tobacco abuse 01/03/2016  . Asthma 01/03/2016  . Leg swelling 01/03/2016  . Tachycardia 01/03/2016  . Normocytic anemia 01/03/2016  . Elevated troponin I level 01/03/2016  . Right rib fracture 01/03/2016  . Chest pain     Current Outpatient Medications:  .  albuterol (PROVENTIL HFA;VENTOLIN HFA) 108 (90 Base) MCG/ACT inhaler, Inhale 1-2 puffs into the lungs every 4 (four) hours as needed for wheezing or shortness of breath. (Patient not taking: Reported on 02/02/2018), Disp: 1 Inhaler, Rfl: 4 .  aspirin 81 MG EC tablet, Take 1 tablet (81 mg total) by mouth daily., Disp: 30 tablet, Rfl: 4 .  budesonide-formoterol (SYMBICORT) 80-4.5 MCG/ACT inhaler, Inhale 2 puffs into the lungs 2 (two) times daily. (Patient not taking: Reported on 02/02/2018), Disp: 1 Inhaler, Rfl: 3 .  digoxin (LANOXIN) 0.125 MG tablet, Take 1 tablet (0.125 mg total) by mouth daily., Disp: 30 tablet, Rfl: 3 .  furosemide (LASIX) 80 MG tablet, Take 0.5 tablets (40 mg total) by mouth daily., Disp: 30 tablet, Rfl: 5 .  losartan (COZAAR) 25 MG tablet, Take 1 tablet (25 mg total) by mouth at bedtime., Disp: 30 tablet, Rfl: 3 .  mometasone-formoterol (DULERA) 100-5 MCG/ACT AERO, Inhale 2 puffs into the lungs 2 (two)  times daily., Disp: , Rfl:  .  Multiple Vitamin (MULTIVITAMIN WITH MINERALS) TABS tablet, Take 1 tablet by mouth daily., Disp: , Rfl:  .  spironolactone (ALDACTONE) 25 MG tablet, Take 1 tablet (25 mg total) by mouth daily., Disp: 30 tablet, Rfl: 11 .  spironolactone (ALDACTONE) 25 MG tablet, TAKE 1/2 TABLET BY MOUTH DAILY., Disp: 15 tablet, Rfl: 3 Allergies  Allergen Reactions  . Hydrocodone Hives  . Lisinopril Swelling and Other (See Comments)    Facial swelling/angioedema  . Prednisone Shortness Of Breath, Nausea Only, Swelling and Other (See Comments)    Also made chest feel tight and genital area, legs, and face became swollen badly  . Penicillins Hives and Swelling     Has patient had a PCN reaction causing immediate rash, facial/tongue/throat swelling, SOB or lightheadedness with hypotension: Yes Has patient had a PCN reaction causing severe rash involving mucus membranes or skin necrosis: No Has patient had a PCN reaction that required hospitalization: No Has patient had a PCN reaction occurring within the last 10 years: No If all of the above answers are "NO", then may proceed with Cephalosporin use.      Social History   Socioeconomic History  . Marital status: Divorced    Spouse name: Not on file  . Number of children: Not on file  . Years of education: Not on file  . Highest education level: Not on file  Occupational History  . Not on file  Social Needs  .  Financial resource strain: Not on file  . Food insecurity:    Worry: Not on file    Inability: Not on file  . Transportation needs:    Medical: Not on file    Non-medical: Not on file  Tobacco Use  . Smoking status: Former Smoker    Packs/day: 1.00    Years: 30.00    Pack years: 30.00    Types: Cigarettes    Last attempt to quit: 09/27/2017    Years since quitting: 0.5  . Smokeless tobacco: Never Used  Substance and Sexual Activity  . Alcohol use: Yes    Alcohol/week: 5.0 standard drinks    Types: 5  Shots of liquor per week    Frequency: Never    Comment: 5-6 shots of vodka daily; "stopped it all after I had stroke 09/27/2017"  . Drug use: Yes    Types: Cocaine, Marijuana    Comment: 11/09/2017 "none since 09/27/2017"  . Sexual activity: Not Currently  Lifestyle  . Physical activity:    Days per week: Not on file    Minutes per session: Not on file  . Stress: Not on file  Relationships  . Social connections:    Talks on phone: Not on file    Gets together: Not on file    Attends religious service: Not on file    Active member of club or organization: Not on file    Attends meetings of clubs or organizations: Not on file    Relationship status: Not on file  . Intimate partner violence:    Fear of current or ex partner: Not on file    Emotionally abused: Not on file    Physically abused: Not on file    Forced sexual activity: Not on file  Other Topics Concern  . Not on file  Social History Narrative  . Not on file    Physical Exam  Pulmonary/Chest: Effort normal. No respiratory distress. He has no wheezes. He has no rales.  Abdominal: Soft.  Musculoskeletal: He exhibits no edema.  Skin: Skin is warm and dry. He is not diaphoretic.        Future Appointments  Date Time Provider Department Center  05/04/2018 10:00 AM [facility] ECHO 1-BUZZ [facility]  05/04/2018 11:20 AM Bensimhon, Bevelyn Buckles, MD [facility] None     BP 98/70 (BP Location: Left Arm, Patient Position: Sitting, Cuff Size: Normal)   Pulse 89   Resp 16   Wt 147 lb 4.8 oz (66.8 kg)   SpO2 98%   BMI 22.07 kg/m   Weight yesterday-144 Last visit weight-145  ATF pt CAO x4 just waking up for the day.  Pt appears a little groogy; he just took his meds about 20 mins prior to my arrival.  He has been out of his albuterol inhalers and furosemide for several weeks.  Pt has no weight gain, no sob, no chest pain.  Pt stated that he has had car trouble and other issues related to financial difficulties.  rx bottles  verified/pt refilling his pill box during our visit.     Pt has been trying to make an appointment with PCP but was unable to due to transportation issues. Pt knows he has to make an appointment with Morrie Sheldon for his stress test and will call after 3pm today.   Medication ordered- none Jaxn Chiquito, EMT Paramedic [phone] 04/17/2018    ACTION: Home visit completed

## 2018-05-02 ENCOUNTER — Telehealth (HOSPITAL_COMMUNITY): Payer: Self-pay

## 2018-05-04 ENCOUNTER — Ambulatory Visit (HOSPITAL_COMMUNITY): Admission: RE | Admit: 2018-05-04 | Payer: Self-pay | Source: Ambulatory Visit

## 2018-05-04 ENCOUNTER — Telehealth (HOSPITAL_COMMUNITY): Payer: Self-pay | Admitting: Surgery

## 2018-05-04 ENCOUNTER — Telehealth (HOSPITAL_COMMUNITY): Payer: Self-pay

## 2018-05-04 ENCOUNTER — Encounter (HOSPITAL_COMMUNITY): Payer: Self-pay | Admitting: Internal Medicine

## 2018-05-04 ENCOUNTER — Other Ambulatory Visit (HOSPITAL_COMMUNITY): Payer: Self-pay

## 2018-05-04 NOTE — Telephone Encounter (Signed)
Pt was called to confim that he was enroute to or at the heart failure clinic.  His phone is going straight to vm; vm not set up therefore no message could be left.

## 2018-05-04 NOTE — Progress Notes (Signed)
Paramedicine Encounter   Patient ID: Mark [name] , male,   DOB: 09-05-1975,42 y.o.,  MRN: [id number]   Pt a NO SHOW to heart failure appointment; staff has called pt several times with no success.    Toussaint Golson, EMT-Paramedic [phone] 05/04/2018   ACTION: Home visit completed

## 2018-05-04 NOTE — Telephone Encounter (Signed)
I called pt to remind him of his appointment with the heart failure clinic. He stated that he was aware and that he's almost out of lasix.

## 2018-05-04 NOTE — Telephone Encounter (Signed)
Mark [name] will be discharged from the HF Community Paramedicine program at this time secondary to inability to make contact and schedule home visits.

## 2018-05-09 ENCOUNTER — Other Ambulatory Visit (HOSPITAL_COMMUNITY): Payer: Self-pay

## 2018-05-09 NOTE — Progress Notes (Signed)
Mark [name] is d/c from the paramedicine program due to him not keeping appointments with heart failure clinic and myself.

## 2018-05-15 MED FILL — DIGOXIN 0.125 MG TABLET: 125 | 30 days supply | Qty: 30 | Fill #3

## 2018-05-15 MED FILL — LOSARTAN POTASSIUM 25 MG TA: 25 | 30 days supply | Qty: 30 | Fill #3

## 2018-05-15 MED FILL — FUROSEMIDE 80 MG TABLET: 80 | 30 days supply | Qty: 15 | Fill #5

## 2018-05-15 MED FILL — SPIRONOLACTONE 25 MG TABLET: 25 | 30 days supply | Qty: 15 | Fill #1

## 2018-07-09 ENCOUNTER — Other Ambulatory Visit (HOSPITAL_COMMUNITY): Payer: Self-pay | Admitting: Internal Medicine

## 2018-07-09 MED FILL — SPIRONOLACTONE 25 MG TABLET: 25 | 30 days supply | Qty: 15 | Fill #2

## 2018-07-09 MED FILL — DIGOXIN 0.125 MG TABLET: 125 | 30 days supply | Qty: 30 | Fill #0

## 2018-07-09 MED FILL — FUROSEMIDE 80 MG TABLET: 80 | 30 days supply | Qty: 15 | Fill #6

## 2018-07-12 ENCOUNTER — Other Ambulatory Visit (HOSPITAL_COMMUNITY): Payer: Self-pay

## 2018-07-12 NOTE — Progress Notes (Signed)
Pt called requesting assistance for med p/u.  meds picked up from outpt pharmacy and taken to him.   Kerry Hough, EMT-Paramedic  07/12/18

## 2018-08-09 ENCOUNTER — Telehealth (HOSPITAL_COMMUNITY): Payer: Self-pay

## 2018-08-09 NOTE — Telephone Encounter (Signed)
Mark [name] stated that the paramedic might pick up his meds for him today but if not, he will come pick them up tomorrow. He reported running out of fluid pills and have had some SOB lately. He denies edema/sudden change in wt.   He explained this chart he follows that tells him what he can/cannot eat. He admits not paying any attention to the chart around the holidays but still tries not to over-indulge.   Mirna Mires  PharmD Candidate  [employer]  Class of [phone]

## 2018-08-17 MED FILL — SPIRONOLACTONE 25 MG TABLET: 25 | 30 days supply | Qty: 15 | Fill #3

## 2018-08-17 MED FILL — LOSARTAN POTASSIUM 25 MG TA: 25 | 30 days supply | Qty: 30 | Fill #0

## 2018-08-17 MED FILL — DIGOXIN 0.125 MG TABLET: 125 | 30 days supply | Qty: 30 | Fill #1

## 2018-08-17 MED FILL — FUROSEMIDE 80 MG TABLET: 80 | 30 days supply | Qty: 15 | Fill #7

## 2018-09-19 MED FILL — FUROSEMIDE 80 MG TABLET: 80 | 30 days supply | Qty: 15 | Fill #8

## 2018-09-26 IMAGING — DX DG CHEST 2V
2 series · 2 of 2 positions shown · non-contrast
Comparison: 09/28/2017, CT chest 09/21/2017

CLINICAL DATA: Shortness of breath with wheezing

EXAM:
CHEST  2 VIEW

[chest pa]
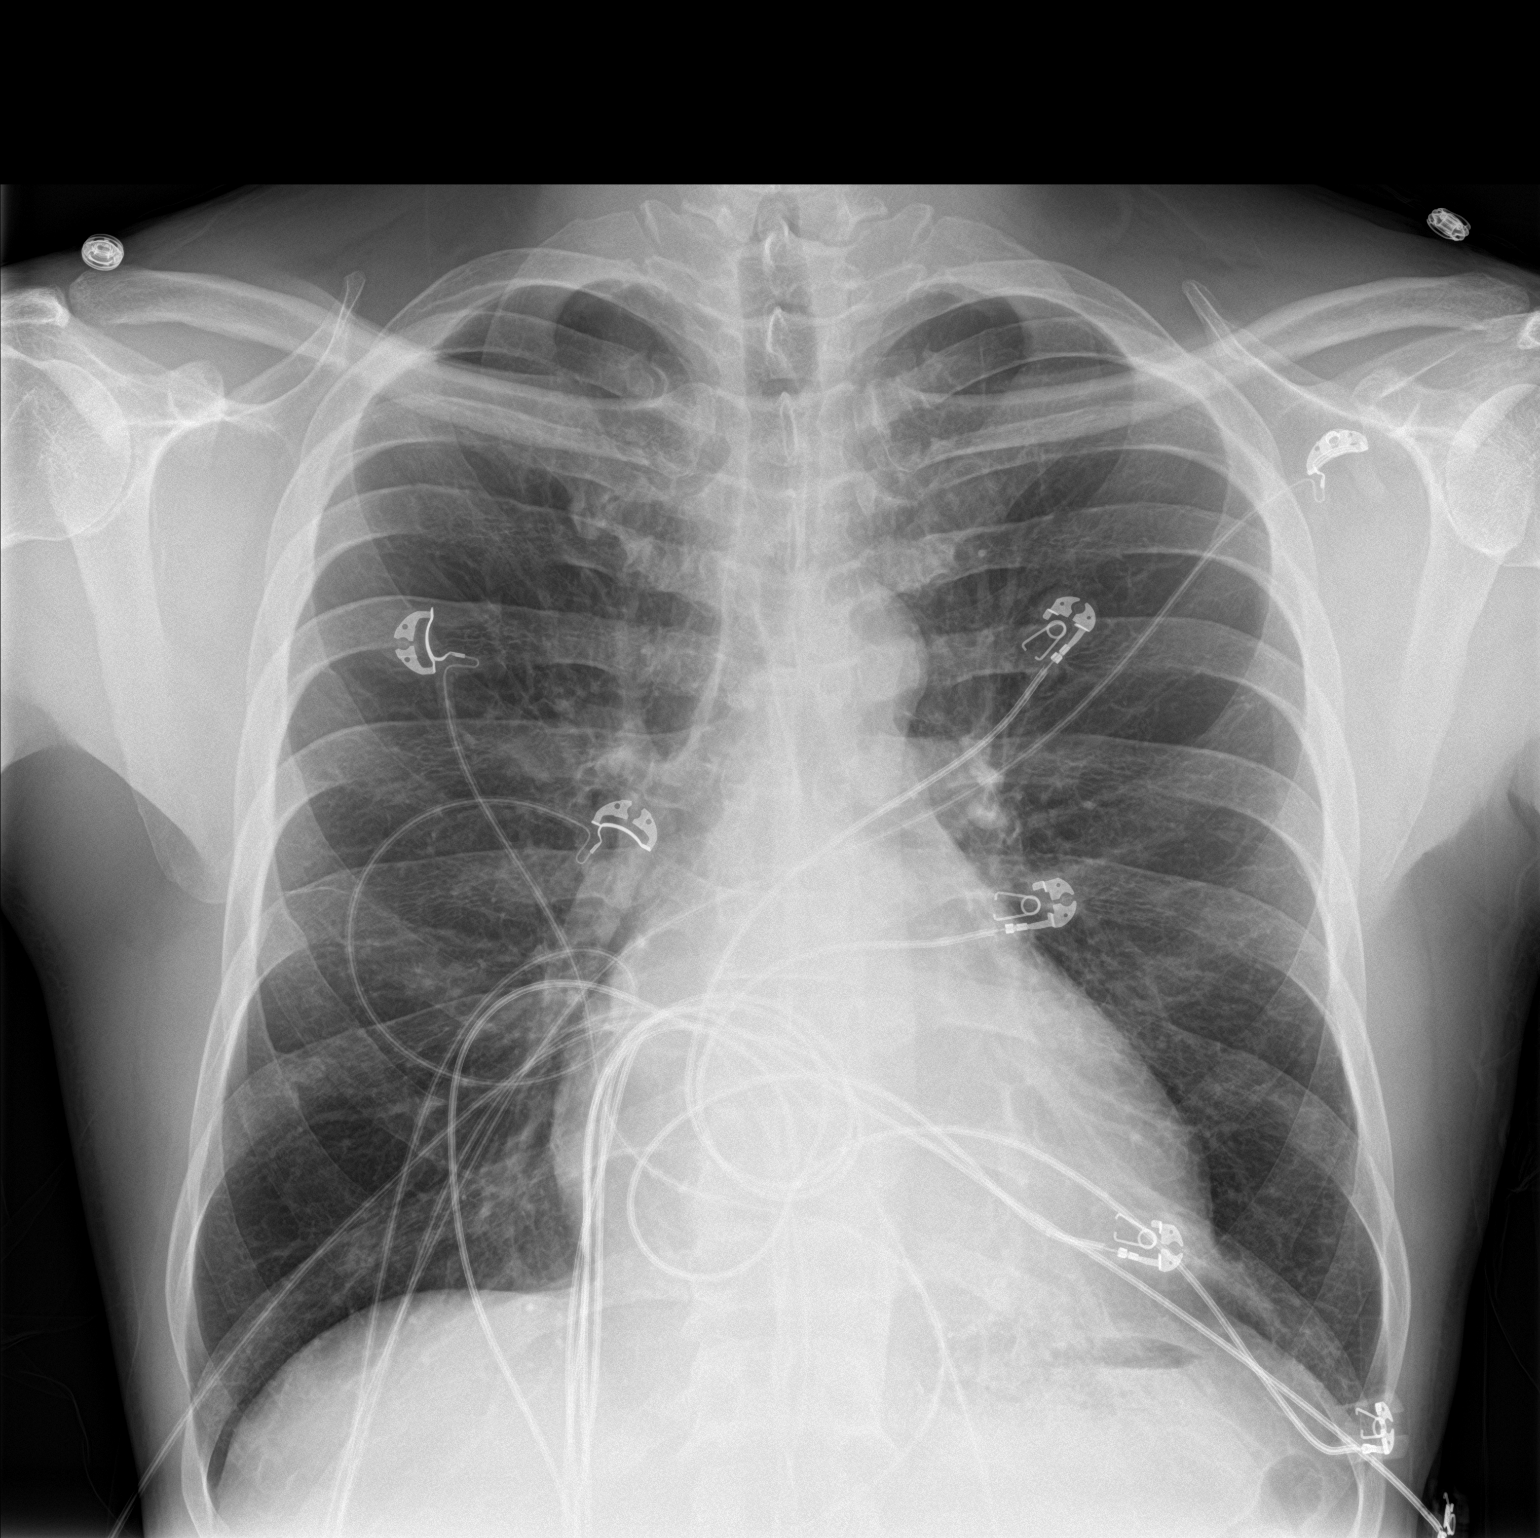

[chest lat]
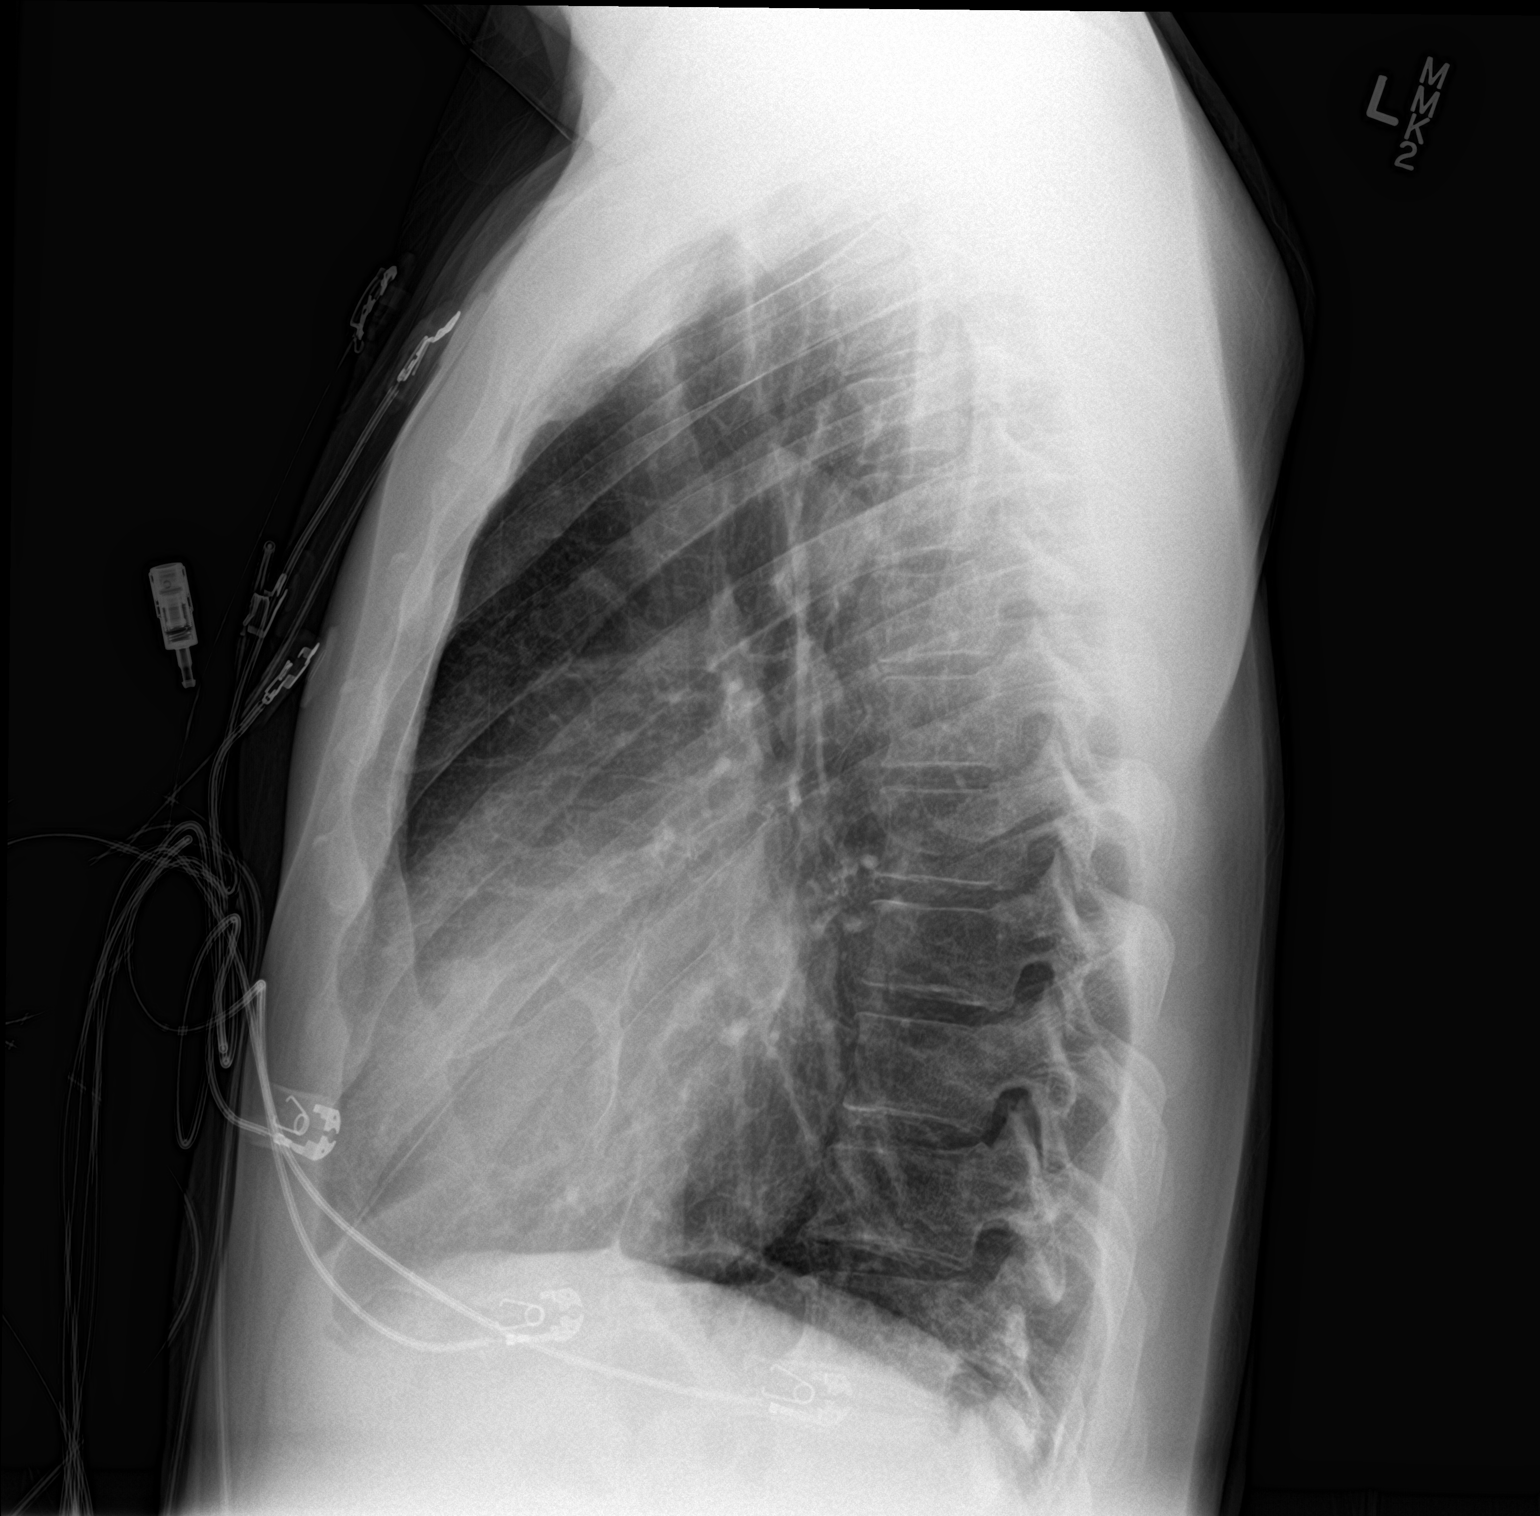

[2 of 2 positions shown; findings below may reference images not displayed]

FINDINGS: Hyperinflation with emphysematous disease. Streaky atelectasis or
mild infiltrate at the left lung base. Mild cardiomegaly. No pleural
effusion. Mild aortic atherosclerosis. No pneumothorax.
IMPRESSION: 1. Hyperinflation with emphysematous disease.
2. Streaky atelectasis or infiltrate at the left lung base
3. Cardiomegaly

## 2018-10-10 ENCOUNTER — Other Ambulatory Visit (HOSPITAL_COMMUNITY): Payer: Self-pay | Admitting: Internal Medicine

## 2018-10-10 MED FILL — FUROSEMIDE 80 MG TABLET: 80 | 30 days supply | Qty: 15 | Fill #9

## 2018-10-10 NOTE — Telephone Encounter (Signed)
Pt needs appt for future refills, [phone]

## 2018-11-05 MED FILL — LOSARTAN POTASSIUM 25 MG TA: 25 | 30 days supply | Qty: 30 | Fill #1 | Status: TO

## 2018-11-05 MED FILL — DIGOXIN 0.125 MG TABLET: 125 | 30 days supply | Qty: 30 | Fill #2 | Status: TO

## 2018-11-05 MED FILL — FUROSEMIDE 80 MG TABLET: 80 | 30 days supply | Qty: 15 | Fill #10 | Status: TO

## 2018-12-03 ENCOUNTER — Telehealth: Payer: Self-pay | Admitting: Pharmacist

## 2018-12-03 MED FILL — FUROSEMIDE 80 MG TAB: 80 | 30 days supply | Qty: 15 | Fill #0

## 2018-12-03 MED FILL — LOSARTAN POTASSIUM 25 MG TA: 25 | 30 days supply | Qty: 30 | Fill #0

## 2018-12-03 MED FILL — DIGOXIN 0.125 MG TABLET: 125 | 30 days supply | Qty: 30 | Fill #0

## 2018-12-03 NOTE — Telephone Encounter (Signed)
Received a call this weekend on the [facility] on-call line about patient and his medications. RN over the phone told me that patient was out of his albuterol, furosemide, losartan, and digoxin.  Patient stated that the paramedics were at his house last week when he had SOB and took him to the ED with his medications but he was discharged back home without them. He has no medications on hand and patient was having SOB and edema. I instructed RN to tell patient to call 911 if he was still having those symptoms and to call his cardiologist in the morning and that I would follow-up on his medications.  Patient usually gets his medications filled at [facility] but pharmacy is closed temporarily due to COVID 19 and all patients are routed through [facility]. Spoke to [employer] over at [employer] and patient's medications are able to be mailed or picked up today. Called to ask patient if he wants them mailed and see his situation but there was no answer and no option to leave a VM.

## 2018-12-24 ENCOUNTER — Emergency Department (HOSPITAL_COMMUNITY): Payer: Medicaid Other

## 2018-12-24 ENCOUNTER — Encounter (HOSPITAL_COMMUNITY): Payer: Self-pay | Admitting: Emergency Medicine

## 2018-12-24 ENCOUNTER — Inpatient Hospital Stay (HOSPITAL_COMMUNITY)
Admission: EM | Admit: 2018-12-24 | Discharge: 2018-12-30 | DRG: 291 | Disposition: A | Discharge status: Home / Self Care | Payer: Medicaid Other | Attending: Internal Medicine | Admitting: Internal Medicine

## 2018-12-24 DIAGNOSIS — R570 Cardiogenic shock: Secondary | ICD-10-CM | POA: Diagnosis not present

## 2018-12-24 DIAGNOSIS — I5082 Biventricular heart failure: Secondary | ICD-10-CM | POA: Diagnosis present

## 2018-12-24 DIAGNOSIS — Z7982 Long term (current) use of aspirin: Secondary | ICD-10-CM

## 2018-12-24 DIAGNOSIS — K047 Periapical abscess without sinus: Secondary | ICD-10-CM | POA: Diagnosis present

## 2018-12-24 DIAGNOSIS — Z7951 Long term (current) use of inhaled steroids: Secondary | ICD-10-CM

## 2018-12-24 DIAGNOSIS — J9601 Acute respiratory failure with hypoxia: Secondary | ICD-10-CM | POA: Diagnosis present

## 2018-12-24 DIAGNOSIS — Z880 Allergy status to penicillin: Secondary | ICD-10-CM

## 2018-12-24 DIAGNOSIS — R0602 Shortness of breath: Secondary | ICD-10-CM

## 2018-12-24 DIAGNOSIS — I2489 Other forms of acute ischemic heart disease: Secondary | ICD-10-CM

## 2018-12-24 DIAGNOSIS — F17210 Nicotine dependence, cigarettes, uncomplicated: Secondary | ICD-10-CM | POA: Diagnosis present

## 2018-12-24 DIAGNOSIS — I513 Intracardiac thrombosis, not elsewhere classified: Secondary | ICD-10-CM | POA: Diagnosis present

## 2018-12-24 DIAGNOSIS — Z8673 Personal history of transient ischemic attack (TIA), and cerebral infarction without residual deficits: Secondary | ICD-10-CM

## 2018-12-24 DIAGNOSIS — J45909 Unspecified asthma, uncomplicated: Secondary | ICD-10-CM | POA: Diagnosis present

## 2018-12-24 DIAGNOSIS — Z9119 Patient's noncompliance with other medical treatment and regimen: Secondary | ICD-10-CM

## 2018-12-24 DIAGNOSIS — I248 Other forms of acute ischemic heart disease: Secondary | ICD-10-CM | POA: Diagnosis present

## 2018-12-24 DIAGNOSIS — I472 Ventricular tachycardia: Secondary | ICD-10-CM | POA: Diagnosis not present

## 2018-12-24 DIAGNOSIS — Z79899 Other long term (current) drug therapy: Secondary | ICD-10-CM

## 2018-12-24 DIAGNOSIS — I471 Supraventricular tachycardia: Secondary | ICD-10-CM | POA: Diagnosis present

## 2018-12-24 DIAGNOSIS — E1165 Type 2 diabetes mellitus with hyperglycemia: Secondary | ICD-10-CM | POA: Diagnosis present

## 2018-12-24 DIAGNOSIS — I272 Pulmonary hypertension, unspecified: Secondary | ICD-10-CM | POA: Diagnosis present

## 2018-12-24 DIAGNOSIS — K0889 Other specified disorders of teeth and supporting structures: Secondary | ICD-10-CM

## 2018-12-24 DIAGNOSIS — I5043 Acute on chronic combined systolic (congestive) and diastolic (congestive) heart failure: Principal | ICD-10-CM | POA: Diagnosis present

## 2018-12-24 DIAGNOSIS — F1410 Cocaine abuse, uncomplicated: Secondary | ICD-10-CM | POA: Diagnosis present

## 2018-12-24 DIAGNOSIS — J449 Chronic obstructive pulmonary disease, unspecified: Secondary | ICD-10-CM

## 2018-12-24 DIAGNOSIS — R778 Other specified abnormalities of plasma proteins: Secondary | ICD-10-CM | POA: Diagnosis present

## 2018-12-24 DIAGNOSIS — Z9114 Patient's other noncompliance with medication regimen: Secondary | ICD-10-CM

## 2018-12-24 DIAGNOSIS — D649 Anemia, unspecified: Secondary | ICD-10-CM | POA: Diagnosis present

## 2018-12-24 DIAGNOSIS — E1122 Type 2 diabetes mellitus with diabetic chronic kidney disease: Secondary | ICD-10-CM | POA: Diagnosis present

## 2018-12-24 DIAGNOSIS — E871 Hypo-osmolality and hyponatremia: Secondary | ICD-10-CM | POA: Diagnosis present

## 2018-12-24 DIAGNOSIS — I5023 Acute on chronic systolic (congestive) heart failure: Secondary | ICD-10-CM

## 2018-12-24 DIAGNOSIS — I509 Heart failure, unspecified: Secondary | ICD-10-CM

## 2018-12-24 DIAGNOSIS — I240 Acute coronary thrombosis not resulting in myocardial infarction: Secondary | ICD-10-CM

## 2018-12-24 DIAGNOSIS — R0600 Dyspnea, unspecified: Secondary | ICD-10-CM

## 2018-12-24 DIAGNOSIS — E872 Acidosis: Secondary | ICD-10-CM | POA: Diagnosis present

## 2018-12-24 DIAGNOSIS — Z20828 Contact with and (suspected) exposure to other viral communicable diseases: Secondary | ICD-10-CM | POA: Diagnosis present

## 2018-12-24 DIAGNOSIS — R7989 Other specified abnormal findings of blood chemistry: Secondary | ICD-10-CM | POA: Diagnosis present

## 2018-12-24 DIAGNOSIS — K029 Dental caries, unspecified: Secondary | ICD-10-CM | POA: Diagnosis present

## 2018-12-24 DIAGNOSIS — I428 Other cardiomyopathies: Secondary | ICD-10-CM | POA: Diagnosis present

## 2018-12-24 DIAGNOSIS — F1490 Cocaine use, unspecified, uncomplicated: Secondary | ICD-10-CM

## 2018-12-24 DIAGNOSIS — N182 Chronic kidney disease, stage 2 (mild): Secondary | ICD-10-CM | POA: Diagnosis present

## 2018-12-24 LAB — HEPATIC FUNCTION PANEL
ALT: 31 U/L (ref 0–44)
AST: 55 U/L — ABNORMAL HIGH (ref 15–41)
Albumin: 3.3 g/dL — ABNORMAL LOW (ref 3.5–5.0)
Alkaline Phosphatase: 112 U/L (ref 38–126)
Bilirubin, Direct: 1 mg/dL — ABNORMAL HIGH (ref 0.0–0.2)
Indirect Bilirubin: 1.4 mg/dL — ABNORMAL HIGH (ref 0.3–0.9)
Total Bilirubin: 2.4 mg/dL — ABNORMAL HIGH (ref 0.3–1.2)
Total Protein: 7.7 g/dL (ref 6.5–8.1)

## 2018-12-24 LAB — CBC
HCT: 41.1 % (ref 39.0–52.0)
Hemoglobin: 12.9 g/dL — ABNORMAL LOW (ref 13.0–17.0)
MCH: 26.1 pg (ref 26.0–34.0)
MCHC: 31.4 g/dL (ref 30.0–36.0)
MCV: 83 fL (ref 80.0–100.0)
Platelets: 189 10*3/uL (ref 150–400)
RBC: 4.95 MIL/uL (ref 4.22–5.81)
RDW: 18.9 % — ABNORMAL HIGH (ref 11.5–15.5)
WBC: 6.7 10*3/uL (ref 4.0–10.5)
nRBC: 0 % (ref 0.0–0.2)

## 2018-12-24 LAB — BASIC METABOLIC PANEL
Anion gap: 10 (ref 5–15)
BUN: 31 mg/dL — ABNORMAL HIGH (ref 6–20)
CO2: 27 mmol/L (ref 22–32)
Calcium: 8.9 mg/dL (ref 8.9–10.3)
Chloride: 98 mmol/L (ref 98–111)
Creatinine, Ser: 1.15 mg/dL (ref 0.61–1.24)
GFR calc Af Amer: >60 mL/min (ref >60)
GFR calc non Af Amer: >60 mL/min (ref >60)
Glucose, Bld: 97 mg/dL (ref 70–99)
Potassium: 4.3 mmol/L (ref 3.5–5.1)
Sodium: 135 mmol/L (ref 135–145)

## 2018-12-24 LAB — BRAIN NATRIURETIC PEPTIDE: B Natriuretic Peptide: 3973.6 pg/mL — ABNORMAL HIGH (ref 0.0–100.0)

## 2018-12-24 LAB — TROPONIN I: Troponin I: 0.08 ng/mL (ref <0.03)

## 2018-12-24 LAB — SARS CORONAVIRUS 2 BY RT PCR (HOSPITAL ORDER, PERFORMED IN ~~LOC~~ HOSPITAL LAB): SARS Coronavirus 2: NEGATIVE

## 2018-12-24 LAB — DIGOXIN LEVEL: Digoxin Level: 0.5 ng/mL — ABNORMAL LOW (ref 0.8–2.0)

## 2018-12-24 MED ORDER — SODIUM CHLORIDE 0.9% FLUSH
3.0000 mL | Freq: Once | INTRAVENOUS | Status: AC
  Administered 2018-12-24: 3 mL via INTRAVENOUS

## 2018-12-24 MED ORDER — FUROSEMIDE 10 MG/ML IJ SOLN
40.0000 mg | INTRAMUSCULAR | Status: AC
  Administered 2018-12-24: 40 mg via INTRAVENOUS
  Filled 2018-12-24: qty 4

## 2018-12-24 NOTE — ED Triage Notes (Signed)
Pt from home states Hx CHF and ran out of his lasix  And has been getting progressively SOB over the last several days. Bilat lungs very decreased bases and obvious retraction noted

## 2018-12-24 NOTE — ED Provider Notes (Signed)
[facility]-EMERGENCY DEPT Provider Note   CSN: [id number] Arrival date & time: 12/24/18  2218    History   Chief Complaint Chief Complaint  Patient presents with  . Congestive Heart Failure    HPI Mark [name] is a 43 y.o. male.     The history is provided by the patient and medical records.  Congestive Heart Failure  Associated symptoms include shortness of breath.     43 y.o. M with history of asthma, CHF, CKD, NICM, substance abuse, prior stroke, presenting to the ED for SOB.  Patient states he has been feeling SOB for "a minute".  He state got acutely worse tonight.  He reports he ranout of his lasix once during quarantine, got a refill, but has since run out again 4 days ago.  States it has been very difficult to get into contact with his doctor due to quarantine orders.  He has swelling throughout his entire body, especially legs and face which is common for him during times of fluid overload.  He denies chest pain.  SOB worse with lying flat.  He denies any cough, fever, sick contacts, known COVID exposures.  States he has been self isolating at home.  Does not use home O2.  Past Medical History:  Diagnosis Date  . Asthma   . Chronic systolic CHF (congestive heart failure) (HCC)   . Cigarette smoker   . CKD (chronic kidney disease), stage II    [name] 10/01/2017  . History of echocardiogram    a. Echo 5/17 - EF 20-25%, severe diff HK, restrictive physiology, mild to mod MR, severe reduced RVSF, mod RVE, mild RAE, mod TR, PASP 48 mmHg  . Hx of cardiac cath    a. LHC 5/17 - normal coronary arteries. PA 45/25, mean 33, PCWP mean 18  . NICM (nonischemic cardiomyopathy) (HCC)   . Stroke (HCC) 09/27/2017   "was weak on my left side; I'm fully recovered" (11/09/2017)  . Substance abuse (HCC)    cocaine, marijuana    Patient Active Problem List   Diagnosis Date Noted  . Noncompliance   . Chronic anemia   . Acute systolic CHF (congestive heart  failure) (HCC) 11/09/2017  . History of right MCA stroke 09/28/2017  . Stroke (HCC) 09/28/2017  . Chronic systolic heart failure (HCC) 01/11/2016  . NICM (nonischemic cardiomyopathy) (HCC) 01/11/2016  . CKD (chronic kidney disease) stage 2, GFR 60-89 ml/min 01/11/2016  . Cocaine abuse (HCC) 01/11/2016  . Chest pain, pleuritic 01/03/2016  . Tobacco abuse 01/03/2016  . Asthma 01/03/2016  . Leg swelling 01/03/2016  . Tachycardia 01/03/2016  . Normocytic anemia 01/03/2016  . Elevated troponin I level 01/03/2016  . Right rib fracture 01/03/2016  . Chest pain     Past Surgical History:  Procedure Laterality Date  . CARDIAC CATHETERIZATION N/A 01/05/2016   Procedure: Right/Left Heart Cath and Coronary Angiography;  Surgeon: Lennette Bihari, MD;  Location: [facility] INVASIVE CV LAB;  Service: Cardiovascular;  Laterality: N/A;        Home Medications    Prior to Admission medications   Medication Sig Start Date End Date Taking? Authorizing Provider  albuterol (PROVENTIL HFA;VENTOLIN HFA) 108 (90 Base) MCG/ACT inhaler Inhale 1-2 puffs into the lungs every 4 (four) hours as needed for wheezing or shortness of breath. Patient not taking: Reported on 02/02/2018 10/12/17   Massie Maroon, FNP  aspirin 81 MG EC tablet Take 1 tablet (81 mg total) by mouth daily. 09/22/17  Rai, Delene Ruffini, MD  budesonide-formoterol (SYMBICORT) 80-4.5 MCG/ACT inhaler Inhale 2 puffs into the lungs 2 (two) times daily. 10/12/17   Massie Maroon, FNP  digoxin (LANOXIN) 0.125 MG tablet TAKE 1 TABLET (0.125 MG TOTAL) BY MOUTH DAILY. 07/09/18   Bensimhon, Bevelyn Buckles, MD  furosemide (LASIX) 80 MG tablet Take 0.5 tablets (40 mg total) by mouth daily. 12/07/17   Bensimhon, Bevelyn Buckles, MD  losartan (COZAAR) 25 MG tablet TAKE 1 TABLET (25 MG TOTAL) BY MOUTH AT BEDTIME. 07/09/18 10/07/18  Bensimhon, Bevelyn Buckles, MD  mometasone-formoterol (DULERA) 100-5 MCG/ACT AERO Inhale 2 puffs into the lungs 2 (two) times daily.    [provider]  Multiple Vitamin (MULTIVITAMIN WITH MINERALS) TABS tablet Take 1 tablet by mouth daily.    [provider]  spironolactone (ALDACTONE) 25 MG tablet Take 1 tablet (25 mg total) by mouth daily. 01/05/18 04/05/18  Alford Highland, NP  spironolactone (ALDACTONE) 25 MG tablet TAKE 1/2 TABLET BY MOUTH DAILY. 04/10/18   Bensimhon, Bevelyn Buckles, MD  spironolactone (ALDACTONE) 25 MG tablet TAKE 1/2 TABLET BY MOUTH ONCE DAILY 10/10/18   Bensimhon, Bevelyn Buckles, MD    Family History Family History  Problem Relation Age of Onset  . Cardiomyopathy Father        Reports his father has an LVAD  . Heart failure Father   . Hypertension Father   . Deep vein thrombosis Neg Hx     Social History Social History   Tobacco Use  . Smoking status: Former Smoker    Packs/day: 1.00    Years: 30.00    Pack years: 30.00    Types: Cigarettes    Last attempt to quit: 09/27/2017    Years since quitting: 1.2  . Smokeless tobacco: Never Used  Substance Use Topics  . Alcohol use: Yes    Alcohol/week: 5.0 standard drinks    Types: 5 Shots of liquor per week    Frequency: Never    Comment: 5-6 shots of vodka daily; "stopped it all after I had stroke 09/27/2017"  . Drug use: Yes    Types: Cocaine, Marijuana    Comment: 11/09/2017 "none since 09/27/2017"     Allergies   Hydrocodone; Lisinopril; Prednisone; and Penicillins   Review of Systems Review of Systems  Respiratory: Positive for shortness of breath.   All other systems reviewed and are negative.    Physical Exam Updated Vital Signs BP (!) 120/99 (BP Location: Right Arm)   Pulse (!) 114   Temp 97.6 F (36.4 C) (Axillary) Comment (Src): Pt on NRB  Resp (!) 23   SpO2 100%   Physical Exam Vitals signs and nursing note reviewed.  Constitutional:      Appearance: He is well-developed.  HENT:     Head: Normocephalic and atraumatic.  Eyes:     General: Scleral icterus present.     Conjunctiva/sclera: Conjunctivae normal.     Pupils: Pupils  are equal, round, and reactive to light.     Comments: Slight scleral icterus  Neck:     Musculoskeletal: Normal range of motion.  Cardiovascular:     Rate and Rhythm: Normal rate and regular rhythm.     Heart sounds: Normal heart sounds.  Pulmonary:     Effort: Tachypnea, respiratory distress and retractions present.     Breath sounds: Rales present.     Comments: Distressed with accessory muscle use, speaking in short 2-3 word phrases, crackles audible from beside Abdominal:  General: Bowel sounds are normal.     Palpations: Abdomen is soft.  Musculoskeletal: Normal range of motion.  Skin:    General: Skin is warm and dry.  Neurological:     Mental Status: He is alert and oriented to person, place, and time.      ED Treatments / Results  Labs (all labs ordered are listed, but only abnormal results are displayed) Labs Reviewed  BASIC METABOLIC PANEL - Abnormal; Notable for the following components:      Result Value   BUN 31 (*)    All other components within normal limits  CBC - Abnormal; Notable for the following components:   Hemoglobin 12.9 (*)    RDW 18.9 (*)    All other components within normal limits  TROPONIN I - Abnormal; Notable for the following components:   Troponin I 0.08 (*)    All other components within normal limits  BRAIN NATRIURETIC PEPTIDE - Abnormal; Notable for the following components:   B Natriuretic Peptide 3,973.6 (*)    All other components within normal limits  DIGOXIN LEVEL - Abnormal; Notable for the following components:   Digoxin Level 0.5 (*)    All other components within normal limits  HEPATIC FUNCTION PANEL - Abnormal; Notable for the following components:   Albumin 3.3 (*)    AST 55 (*)    Total Bilirubin 2.4 (*)    Bilirubin, Direct 1.0 (*)    Indirect Bilirubin 1.4 (*)    All other components within normal limits  SARS CORONAVIRUS 2 (HOSPITAL ORDER, PERFORMED IN [facility])  RAPID URINE DRUG SCREEN, HOSP  PERFORMED    EKG EKG Interpretation  Date/Time:  Monday Dec 24 2018 22:30:56 EDT Ventricular Rate:  111 PR Interval:    QRS Duration: 108 QT Interval:  321 QTC Calculation: 437 R Axis:   -103 Text Interpretation:  Sinus tachycardia Probable left atrial enlargement Left anterior fascicular block Abnormal lateral Q waves Anterior infarct, old No STEMI  Confirmed by Alona Bene [phone]) on 12/24/2018 10:42:45 PM   Radiology Dg Chest Port 1 View  Result Date: 12/24/2018 CLINICAL DATA:  CHF EXAM: PORTABLE CHEST 1 VIEW COMPARISON:  11/29/2015 FINDINGS: Cardiomegaly. No confluent airspace opacities, effusions or edema. No acute bony abnormality. IMPRESSION: Cardiomegaly.  No active disease. Electronically Signed   By: Charlett Nose M.D.   On: 12/24/2018 22:58    Procedures Procedures (including critical care time)  CRITICAL CARE Performed by: Garlon Hatchet   Total critical care time: 45 minutes  Critical care time was exclusive of separately billable procedures and treating other patients.  Critical care was necessary to treat or prevent imminent or life-threatening deterioration.  Critical care was time spent personally by me on the following activities: development of treatment plan with patient and/or surrogate as well as nursing, discussions with consultants, evaluation of patient's response to treatment, examination of patient, obtaining history from patient or surrogate, ordering and performing treatments and interventions, ordering and review of laboratory studies, ordering and review of radiographic studies, pulse oximetry and re-evaluation of patient's condition.   Medications Ordered in ED Medications  sodium chloride flush (NS) 0.9 % injection 3 mL (3 mLs Intravenous Given 12/24/18 2306)  furosemide (LASIX) injection 40 mg (40 mg Intravenous Given 12/24/18 2306)     Initial Impression / Assessment and Plan / ED Course  I have reviewed the triage vital signs and the nursing  notes.  Pertinent labs & imaging results that were available  during my care of the patient were reviewed by me and considered in my medical decision making (see chart for details).  43 year old male here with shortness of breath.  He apparently ran out of his Lasix 4 days ago.  He is afebrile and nontoxic here but does appear to have some respiratory distress.  He is tachypneic with retraction and audible crackles from bedside.  He has not had any cough, fever, or known COVID exposures.  Suspect CHF.  Patient was placed on BiPAP, given IV lasix.  He is likely will need admission so will screen for COVID.  Portable chest without noted infiltrates.  Patient's labs as above, BNP is elevated 3973, positive troponin at 0.08 which is likely demand ischemia.  Has had similar values in the past with CHF exacerbations.  Patient is tolerating BiPAP well.  His COVID screen is negative.  Will admit for ongoing care.  Discussed with Dr. Allena KatzPatel-- will admit for ongoing care.  Final Clinical Impressions(s) / ED Diagnoses   Final diagnoses:  Acute on chronic systolic congestive heart failure [facility](HCC)  Demand ischemia [facility](HCC)    ED Discharge Orders    None       Garlon HatchetSanders, Castin Donaghue M, PA-C 12/25/18 0011    Maia PlanLong, Joshua G, MD 12/26/18 1312

## 2018-12-24 NOTE — ED Notes (Signed)
Date and time results received: 12/24/18 2328 (use smartphrase ".now" to insert current time)  Test: Troponin Critical Value: 0.08  Name of Provider Notified: Monia Sabal. PA  Orders Received? Or Actions Taken?:

## 2018-12-24 NOTE — ED Notes (Signed)
Bed: AJ28 Expected date:  Expected time:  Means of arrival:  Comments: EMS 43 yo male increased swelling lower extremities and face-hx CHF-SOB with exertion

## 2018-12-25 ENCOUNTER — Inpatient Hospital Stay (HOSPITAL_COMMUNITY): Payer: Medicaid Other

## 2018-12-25 ENCOUNTER — Other Ambulatory Visit: Payer: Self-pay

## 2018-12-25 DIAGNOSIS — F17210 Nicotine dependence, cigarettes, uncomplicated: Secondary | ICD-10-CM | POA: Diagnosis present

## 2018-12-25 DIAGNOSIS — Z79899 Other long term (current) drug therapy: Secondary | ICD-10-CM | POA: Diagnosis not present

## 2018-12-25 DIAGNOSIS — E872 Acidosis: Secondary | ICD-10-CM | POA: Diagnosis present

## 2018-12-25 DIAGNOSIS — I472 Ventricular tachycardia: Secondary | ICD-10-CM | POA: Diagnosis not present

## 2018-12-25 DIAGNOSIS — I371 Nonrheumatic pulmonary valve insufficiency: Secondary | ICD-10-CM

## 2018-12-25 DIAGNOSIS — D649 Anemia, unspecified: Secondary | ICD-10-CM | POA: Diagnosis present

## 2018-12-25 DIAGNOSIS — I5023 Acute on chronic systolic (congestive) heart failure: Secondary | ICD-10-CM

## 2018-12-25 DIAGNOSIS — K029 Dental caries, unspecified: Secondary | ICD-10-CM | POA: Diagnosis present

## 2018-12-25 DIAGNOSIS — R7989 Other specified abnormal findings of blood chemistry: Secondary | ICD-10-CM

## 2018-12-25 DIAGNOSIS — R0602 Shortness of breath: Secondary | ICD-10-CM | POA: Diagnosis present

## 2018-12-25 DIAGNOSIS — I428 Other cardiomyopathies: Secondary | ICD-10-CM | POA: Diagnosis present

## 2018-12-25 DIAGNOSIS — F1410 Cocaine abuse, uncomplicated: Secondary | ICD-10-CM | POA: Diagnosis present

## 2018-12-25 DIAGNOSIS — J45909 Unspecified asthma, uncomplicated: Secondary | ICD-10-CM | POA: Diagnosis present

## 2018-12-25 DIAGNOSIS — R570 Cardiogenic shock: Secondary | ICD-10-CM | POA: Diagnosis not present

## 2018-12-25 DIAGNOSIS — Z7982 Long term (current) use of aspirin: Secondary | ICD-10-CM | POA: Diagnosis not present

## 2018-12-25 DIAGNOSIS — I240 Acute coronary thrombosis not resulting in myocardial infarction: Secondary | ICD-10-CM

## 2018-12-25 DIAGNOSIS — I340 Nonrheumatic mitral (valve) insufficiency: Secondary | ICD-10-CM

## 2018-12-25 DIAGNOSIS — I5043 Acute on chronic combined systolic (congestive) and diastolic (congestive) heart failure: Secondary | ICD-10-CM | POA: Diagnosis present

## 2018-12-25 DIAGNOSIS — I513 Intracardiac thrombosis, not elsewhere classified: Secondary | ICD-10-CM | POA: Diagnosis present

## 2018-12-25 DIAGNOSIS — N182 Chronic kidney disease, stage 2 (mild): Secondary | ICD-10-CM | POA: Diagnosis present

## 2018-12-25 DIAGNOSIS — J9601 Acute respiratory failure with hypoxia: Secondary | ICD-10-CM | POA: Diagnosis present

## 2018-12-25 DIAGNOSIS — E871 Hypo-osmolality and hyponatremia: Secondary | ICD-10-CM | POA: Diagnosis present

## 2018-12-25 DIAGNOSIS — Z7951 Long term (current) use of inhaled steroids: Secondary | ICD-10-CM | POA: Diagnosis not present

## 2018-12-25 DIAGNOSIS — K047 Periapical abscess without sinus: Secondary | ICD-10-CM | POA: Diagnosis present

## 2018-12-25 DIAGNOSIS — Z9119 Patient's noncompliance with other medical treatment and regimen: Secondary | ICD-10-CM

## 2018-12-25 DIAGNOSIS — Z8673 Personal history of transient ischemic attack (TIA), and cerebral infarction without residual deficits: Secondary | ICD-10-CM | POA: Diagnosis not present

## 2018-12-25 DIAGNOSIS — I471 Supraventricular tachycardia: Secondary | ICD-10-CM | POA: Diagnosis present

## 2018-12-25 DIAGNOSIS — Z20828 Contact with and (suspected) exposure to other viral communicable diseases: Secondary | ICD-10-CM | POA: Diagnosis present

## 2018-12-25 DIAGNOSIS — Z9114 Patient's other noncompliance with medication regimen: Secondary | ICD-10-CM | POA: Diagnosis not present

## 2018-12-25 DIAGNOSIS — I248 Other forms of acute ischemic heart disease: Secondary | ICD-10-CM | POA: Diagnosis present

## 2018-12-25 LAB — ECHOCARDIOGRAM COMPLETE
Height: 71 in
Weight: 2546.75 oz

## 2018-12-25 LAB — CBC
HCT: 42.8 % (ref 39.0–52.0)
Hemoglobin: 13.4 g/dL (ref 13.0–17.0)
MCH: 25.5 pg — ABNORMAL LOW (ref 26.0–34.0)
MCHC: 31.3 g/dL (ref 30.0–36.0)
MCV: 81.5 fL (ref 80.0–100.0)
Platelets: 174 10*3/uL (ref 150–400)
RBC: 5.25 MIL/uL (ref 4.22–5.81)
RDW: 18.6 % — ABNORMAL HIGH (ref 11.5–15.5)
WBC: 9 10*3/uL (ref 4.0–10.5)
nRBC: 0 % (ref 0.0–0.2)

## 2018-12-25 LAB — BASIC METABOLIC PANEL
Anion gap: 9 (ref 5–15)
BUN: 32 mg/dL — ABNORMAL HIGH (ref 6–20)
CO2: 29 mmol/L (ref 22–32)
Calcium: 9.1 mg/dL (ref 8.9–10.3)
Chloride: 98 mmol/L (ref 98–111)
Creatinine, Ser: 1.28 mg/dL — ABNORMAL HIGH (ref 0.61–1.24)
GFR calc Af Amer: >60 mL/min (ref >60)
GFR calc non Af Amer: >60 mL/min (ref >60)
Glucose, Bld: 112 mg/dL — ABNORMAL HIGH (ref 70–99)
Potassium: 4.9 mmol/L (ref 3.5–5.1)
Sodium: 136 mmol/L (ref 135–145)

## 2018-12-25 LAB — PROTIME-INR
INR: 1.6 — ABNORMAL HIGH (ref 0.8–1.2)
Prothrombin Time: 18.6 seconds — ABNORMAL HIGH (ref 11.4–15.2)

## 2018-12-25 LAB — RAPID URINE DRUG SCREEN, HOSP PERFORMED
Amphetamines: NOT DETECTED
Barbiturates: NOT DETECTED
Benzodiazepines: NOT DETECTED
Cocaine: POSITIVE — AB
Opiates: NOT DETECTED
Tetrahydrocannabinol: NOT DETECTED

## 2018-12-25 LAB — APTT: aPTT: 29 seconds (ref 24–36)

## 2018-12-25 LAB — MAGNESIUM: Magnesium: 1.9 mg/dL (ref 1.7–2.4)

## 2018-12-25 LAB — HIV ANTIBODY (ROUTINE TESTING W REFLEX): HIV Screen 4th Generation wRfx: NONREACTIVE

## 2018-12-25 MED ORDER — MOMETASONE FURO-FORMOTEROL FUM 100-5 MCG/ACT IN AERO
2.0000 | INHALATION_SPRAY | Freq: Two times a day (BID) | RESPIRATORY_TRACT | Status: DC
  Administered 2018-12-25 – 2018-12-28 (×8): 2 via RESPIRATORY_TRACT
  Filled 2018-12-25: qty 8.8

## 2018-12-25 MED ORDER — ATORVASTATIN CALCIUM 40 MG PO TABS
40.0000 mg | ORAL_TABLET | Freq: Every day | ORAL | Status: DC
  Administered 2018-12-25 – 2018-12-29 (×5): 40 mg via ORAL
  Filled 2018-12-25 (×5): qty 1

## 2018-12-25 MED ORDER — HEPARIN SODIUM (PORCINE) 5000 UNIT/ML IJ SOLN
5000.0000 [IU] | Freq: Three times a day (TID) | INTRAMUSCULAR | Status: DC
  Administered 2018-12-25 (×2): 5000 [IU] via SUBCUTANEOUS
  Filled 2018-12-25 (×2): qty 1

## 2018-12-25 MED ORDER — CHLORHEXIDINE GLUCONATE 0.12 % MT SOLN
15.0000 mL | Freq: Two times a day (BID) | OROMUCOSAL | Status: DC
  Administered 2018-12-25: 15 mL via OROMUCOSAL
  Filled 2018-12-25: qty 15

## 2018-12-25 MED ORDER — LOSARTAN POTASSIUM 25 MG PO TABS
25.0000 mg | ORAL_TABLET | Freq: Every day | ORAL | Status: DC
  Administered 2018-12-26 – 2018-12-27 (×2): 25 mg via ORAL
  Filled 2018-12-25 (×2): qty 1

## 2018-12-25 MED ORDER — FUROSEMIDE 10 MG/ML IJ SOLN: 40.0000 mg | Freq: Two times a day (BID) | INTRAMUSCULAR | Status: DC

## 2018-12-25 MED ORDER — ORAL CARE MOUTH RINSE: 15.0000 mL | Freq: Two times a day (BID) | OROMUCOSAL | Status: DC

## 2018-12-25 MED ORDER — WARFARIN SODIUM 5 MG PO TABS
7.5000 mg | ORAL_TABLET | Freq: Once | ORAL | Status: AC
  Administered 2018-12-25: 7.5 mg via ORAL
  Filled 2018-12-25: qty 1

## 2018-12-25 MED ORDER — HEPARIN BOLUS VIA INFUSION
1200.0000 [IU] | Freq: Once | INTRAVENOUS | Status: AC
  Administered 2018-12-25: 1200 [IU] via INTRAVENOUS
  Filled 2018-12-25: qty 1200

## 2018-12-25 MED ORDER — HEPARIN (PORCINE) 25000 UT/250ML-% IV SOLN
1300.0000 [IU]/h | INTRAVENOUS | Status: DC
  Administered 2018-12-25: 1300 [IU]/h via INTRAVENOUS
  Filled 2018-12-25: qty 250

## 2018-12-25 MED ORDER — FUROSEMIDE 10 MG/ML IJ SOLN
40.0000 mg | Freq: Four times a day (QID) | INTRAMUSCULAR | Status: DC
  Administered 2018-12-25 – 2018-12-27 (×10): 40 mg via INTRAVENOUS
  Filled 2018-12-25 (×10): qty 4

## 2018-12-25 MED ORDER — ACETAMINOPHEN 325 MG PO TABS
650.0000 mg | ORAL_TABLET | Freq: Four times a day (QID) | ORAL | Status: DC | PRN
  Administered 2018-12-25: 650 mg via ORAL
  Filled 2018-12-25: qty 2

## 2018-12-25 MED ORDER — WARFARIN SODIUM 5 MG PO TABS: 7.5000 mg | ORAL_TABLET | Freq: Once | ORAL | Status: DC

## 2018-12-25 MED ORDER — ALBUTEROL SULFATE HFA 108 (90 BASE) MCG/ACT IN AERS: 1.0000 | INHALATION_SPRAY | RESPIRATORY_TRACT | Status: DC | PRN

## 2018-12-25 MED ORDER — ACETAMINOPHEN 650 MG RE SUPP: 650.0000 mg | Freq: Four times a day (QID) | RECTAL | Status: DC | PRN

## 2018-12-25 MED ORDER — ACETAMINOPHEN 325 MG PO TABS
650.0000 mg | ORAL_TABLET | ORAL | Status: DC | PRN
  Administered 2018-12-25 – 2018-12-30 (×17): 650 mg via ORAL
  Filled 2018-12-25 (×18): qty 2

## 2018-12-25 MED ORDER — ACETAMINOPHEN 650 MG RE SUPP: 650.0000 mg | RECTAL | Status: DC | PRN

## 2018-12-25 MED ORDER — PERFLUTREN LIPID MICROSPHERE
1.0000 mL | INTRAVENOUS | Status: AC | PRN
  Administered 2018-12-25: 4 mL via INTRAVENOUS
  Filled 2018-12-25: qty 10

## 2018-12-25 MED ORDER — DIGOXIN 125 MCG PO TABS
0.1250 mg | ORAL_TABLET | Freq: Every day | ORAL | Status: DC
  Administered 2018-12-25 – 2018-12-30 (×5): 0.125 mg via ORAL
  Filled 2018-12-25 (×5): qty 1

## 2018-12-25 MED ORDER — WARFARIN - PHARMACIST DOSING INPATIENT: Freq: Every day | Status: DC

## 2018-12-25 MED ORDER — TRAMADOL HCL 50 MG PO TABS: 50.0000 mg | ORAL_TABLET | Freq: Four times a day (QID) | ORAL | Status: DC | PRN

## 2018-12-25 MED ORDER — NICOTINE 14 MG/24HR TD PT24
14.0000 mg | MEDICATED_PATCH | Freq: Every day | TRANSDERMAL | Status: DC
  Administered 2018-12-25 – 2018-12-30 (×6): 14 mg via TRANSDERMAL
  Filled 2018-12-25 (×6): qty 1

## 2018-12-25 MED ORDER — SPIRONOLACTONE 25 MG PO TABS
25.0000 mg | ORAL_TABLET | Freq: Every day | ORAL | Status: DC
  Administered 2018-12-25 – 2018-12-27 (×3): 25 mg via ORAL
  Filled 2018-12-25 (×3): qty 1

## 2018-12-25 MED ORDER — ALBUTEROL SULFATE (2.5 MG/3ML) 0.083% IN NEBU
2.5000 mg | INHALATION_SOLUTION | RESPIRATORY_TRACT | Status: DC | PRN
  Administered 2018-12-25 – 2018-12-30 (×5): 2.5 mg via RESPIRATORY_TRACT
  Filled 2018-12-25 (×5): qty 3

## 2018-12-25 MED ORDER — ASPIRIN EC 81 MG PO TBEC
81.0000 mg | DELAYED_RELEASE_TABLET | Freq: Every day | ORAL | Status: DC
  Administered 2018-12-25 – 2018-12-30 (×6): 81 mg via ORAL
  Filled 2018-12-25 (×6): qty 1

## 2018-12-25 NOTE — ED Notes (Signed)
Pt requesting something to eat. Will notify RN.

## 2018-12-25 NOTE — Progress Notes (Signed)
  Echocardiogram 2D Echocardiogram with definity has been performed.  Leta Jungling M 12/25/2018, 1:20 PM

## 2018-12-25 NOTE — Progress Notes (Signed)
Pt transported to ICU on BIPAP without incident.  RT to monitor and assess as needed.  

## 2018-12-25 NOTE — Progress Notes (Signed)
ANTICOAGULATION CONSULT NOTE - Initial Consult  Pharmacy Consult for warfarin and heparin Indication: LV thrombus  Allergies  Allergen Reactions  . Hydrocodone Hives  . Lisinopril Swelling and Other (See Comments)    Facial swelling/angioedema  . Prednisone Shortness Of Breath, Nausea Only, Swelling and Other (See Comments)    Also made chest feel tight and genital area, legs, and face became swollen badly  . Penicillins Hives and Swelling     Has patient had a PCN reaction causing immediate rash, facial/tongue/throat swelling, SOB or lightheadedness with hypotension: Yes Has patient had a PCN reaction causing severe rash involving mucus membranes or skin necrosis: No Has patient had a PCN reaction that required hospitalization: No Has patient had a PCN reaction occurring within the last 10 years: No If all of the above answers are "NO", then may proceed with Cephalosporin use.     Patient Measurements: Height: 5\' 11"  (180.3 cm) Weight: 159 lb 2.8 oz (72.2 kg) IBW/kg (Calculated) : 75.3 Heparin Dosing Weight = TBW = 72 kg  Vital Signs: Temp: 97.6 F (36.4 C) (05/05 1600) Temp Source: Axillary (05/05 1600) BP: 109/84 (05/05 1700) Pulse Rate: 82 (05/05 1700)  Labs: Recent Labs    12/24/18 2233 12/24/18 2237 12/25/18 0641  HGB 12.9*  --  13.4  HCT 41.1  --  42.8  PLT 189  --  174  CREATININE 1.15  --  1.28*  TROPONINI  --  0.08*  --     Estimated Creatinine Clearance: 75.2 mL/min (A) (by C-G formula based on SCr of 1.28 mg/dL (H)).   Medical History: Past Medical History:  Diagnosis Date  . Asthma   . Chronic systolic CHF (congestive heart failure) (HCC)   . Cigarette smoker   . CKD (chronic kidney disease), stage II    [name] 10/01/2017  . History of echocardiogram    a. Echo 5/17 - EF 20-25%, severe diff HK, restrictive physiology, mild to mod MR, severe reduced RVSF, mod RVE, mild RAE, mod TR, PASP 48 mmHg  . Hx of cardiac cath    a. LHC 5/17 - normal  coronary arteries. PA 45/25, mean 33, PCWP mean 18  . NICM (nonischemic cardiomyopathy) (HCC)   . Stroke (HCC) 09/27/2017   "was weak on my left side; I'm fully recovered" (11/09/2017)  . Substance abuse (HCC)    cocaine, marijuana   Assessment: Patient is a 43 year old male admitted with SOB. PMH significant for HF, CVA in 2019, asthma, and substance abuse. Pt has history of medication non-compliance. Cardiology following. Diagnosed with LV thrombus and being started on warfarin with heparin bridge. Pt was not on anticoagulant prior to admission.  Baseline labs: -Hgb 13.4 -Plt 174 -INR: Pending -aPTT: Pending  Today, 12/25/18  Hgb 13.4 - WNL  Plt 174 - WNL  Pt currently on heparin 5000 units subQ q8h for DVT ppx. Last dose today at 1600. Has been discontinued.  No significant DDI with warfarin. Of note, patient is on ASA 81 mg PO daily.  Goal of Therapy:  HL 0.3-0.7 INR 2-3 Monitor platelets by anticoagulation protocol: Yes   Plan:   Warfarin 7.5 mg PO once this evening (pending baseline INR)  Heparin bolus of 1200 units (reduced bolus since 5000 units subQ recently given)  Initiate heparin infusion at 1300 units/hr  Check 6 hour HL  HL, INR, and CBC daily  Monitor for signs/symptoms of bleeding   Patient will need anticoagulation bridge for at least the first 5 days  of warfarin therapy and until two days of consecutive INR readings >2.  Cindi Carbon, PharmD 12/25/2018,5:23 PM

## 2018-12-25 NOTE — ED Notes (Signed)
ED TO INPATIENT HANDOFF REPORT  ED Nurse Name and Phone #: [phone]  S Name/Age/Gender Mark [name] 43 y.o. male Room/Bed: WA15/WA15  Code Status   Code Status: Full Code  Home/SNF/Other Home Patient oriented to: self, place, time and situation Is this baseline? Yes   Triage Complete: Triage complete  Chief Complaint Swelling; Shortness of Breath  Triage Note Pt from home states Hx CHF and ran out of his lasix  And has been getting progressively SOB over the last several days. Bilat lungs very decreased bases and obvious retraction noted   Allergies Allergies  Allergen Reactions  . Hydrocodone Hives  . Lisinopril Swelling and Other (See Comments)    Facial swelling/angioedema  . Prednisone Shortness Of Breath, Nausea Only, Swelling and Other (See Comments)    Also made chest feel tight and genital area, legs, and face became swollen badly  . Penicillins Hives and Swelling     Has patient had a PCN reaction causing immediate rash, facial/tongue/throat swelling, SOB or lightheadedness with hypotension: Yes Has patient had a PCN reaction causing severe rash involving mucus membranes or skin necrosis: No Has patient had a PCN reaction that required hospitalization: No Has patient had a PCN reaction occurring within the last 10 years: No If all of the above answers are "NO", then may proceed with Cephalosporin use.     Level of Care/Admitting Diagnosis ED Disposition    ED Disposition Condition Comment   Admit  Hospital Area: [facility] [[id number]]  Level of Care: Stepdown [14]  Admit to SDU based on following criteria: Other see comments  Comments: CHF, BiPAP prn  Covid Evaluation: N/A  Diagnosis: Acute on chronic systolic (congestive) heart failure [facility]) [[id number]]  Admitting Physician: Charlsie Quest [[id number]]  Attending Physician: Charlsie Quest [[id number]]  Estimated length of stay: past midnight tomorrow  Certification:: I certify this  patient will need inpatient services for at least 2 midnights  PT Class (Do Not Modify): Inpatient [101]  PT Acc Code (Do Not Modify): Private [1]       B Medical/Surgery History Past Medical History:  Diagnosis Date  . Asthma   . Chronic systolic CHF (congestive heart failure) (HCC)   . Cigarette smoker   . CKD (chronic kidney disease), stage II    [name] 10/01/2017  . History of echocardiogram    a. Echo 5/17 - EF 20-25%, severe diff HK, restrictive physiology, mild to mod MR, severe reduced RVSF, mod RVE, mild RAE, mod TR, PASP 48 mmHg  . Hx of cardiac cath    a. LHC 5/17 - normal coronary arteries. PA 45/25, mean 33, PCWP mean 18  . NICM (nonischemic cardiomyopathy) (HCC)   . Stroke (HCC) 09/27/2017   "was weak on my left side; I'm fully recovered" (11/09/2017)  . Substance abuse (HCC)    cocaine, marijuana   Past Surgical History:  Procedure Laterality Date  . CARDIAC CATHETERIZATION N/A 01/05/2016   Procedure: Right/Left Heart Cath and Coronary Angiography;  Surgeon: Lennette Bihari, MD;  Location: [facility] INVASIVE CV LAB;  Service: Cardiovascular;  Laterality: N/A;     A IV Location/Drains/Wounds Patient Lines/Drains/Airways Status   Active Line/Drains/Airways    Name:   Placement date:   Placement time:   Site:   Days:   Peripheral IV 12/24/18 Right;Posterior Forearm   12/24/18    2231    Forearm   1          Intake/Output Last 24 hours  Intake/Output  Summary (Last 24 hours) at 12/25/2018 0301 Last data filed at 12/24/2018 2306 Gross per 24 hour  Intake 3 ml  Output -  Net 3 ml    Labs/Imaging Results for orders placed or performed during the hospital encounter of 12/24/18 (from the past 48 hour(s))  Basic metabolic panel     Status: Abnormal   Collection Time: 12/24/18 10:33 PM  Result Value Ref Range   Sodium 135 135 - 145 mmol/L   Potassium 4.3 3.5 - 5.1 mmol/L   Chloride 98 98 - 111 mmol/L   CO2 27 22 - 32 mmol/L   Glucose, Bld 97 70 - 99 mg/dL   BUN 31  (H) 6 - 20 mg/dL   Creatinine, Ser 8.67 0.61 - 1.24 mg/dL   Calcium 8.9 8.9 - 67.2 mg/dL   GFR calc non Af Amer >60 >60 mL/min   GFR calc Af Amer >60 >60 mL/min   Anion gap 10 5 - 15    Comment: Performed at [facility], [address], [place], [place] [address]  CBC     Status: Abnormal   Collection Time: 12/24/18 10:33 PM  Result Value Ref Range   WBC 6.7 4.0 - 10.5 K/uL   RBC 4.95 4.22 - 5.81 MIL/uL   Hemoglobin 12.9 (L) 13.0 - 17.0 g/dL   HCT 96.2 83.6 - 62.9 %   MCV 83.0 80.0 - 100.0 fL   MCH 26.1 26.0 - 34.0 pg   MCHC 31.4 30.0 - 36.0 g/dL   RDW 47.6 (H) 54.6 - 50.3 %   Platelets 189 150 - 400 K/uL   nRBC 0.0 0.0 - 0.2 %    Comment: Performed at [facility], [address], [place], [place] [address]  Troponin I - ONCE - STAT     Status: Abnormal   Collection Time: 12/24/18 10:37 PM  Result Value Ref Range   Troponin I 0.08 (HH) <0.03 ng/mL    Comment: CRITICAL RESULT CALLED TO, READ BACK BY AND VERIFIED WITH: RN Salena Saner HODGES AT 2326 12/24/18 CRUICKSHANK A Performed at [facility], [address] [place], [place] [address]   Brain natriuretic peptide     Status: Abnormal   Collection Time: 12/24/18 10:37 PM  Result Value Ref Range   B Natriuretic Peptide 3,973.6 (H) 0.0 - 100.0 pg/mL    Comment: Performed at [facility], [address] [place], [place] [address]  Digoxin level     Status: Abnormal   Collection Time: 12/24/18 10:37 PM  Result Value Ref Range   Digoxin Level 0.5 (L) 0.8 - 2.0 ng/mL    Comment: Performed at [facility], [address], [place], [place] [address]  Hepatic function panel     Status: Abnormal   Collection Time: 12/24/18 10:37 PM  Result Value Ref Range   Total Protein 7.7 6.5 - 8.1 g/dL   Albumin 3.3 (L) 3.5 - 5.0 g/dL   AST 55 (H) 15 - 41 U/L   ALT 31 0 - 44 U/L   Alkaline Phosphatase 112 38 - 126 U/L   Total Bilirubin 2.4 (H) 0.3 - 1.2  mg/dL   Bilirubin, Direct 1.0 (H) 0.0 - 0.2 mg/dL   Indirect Bilirubin 1.4 (H) 0.3 - 0.9 mg/dL    Comment: Performed at [facility], [address] [place], [place] [address]  SARS Coronavirus 2 (CEPHEID - Performed in [facility] hospital lab), [facility]  Status: None   Collection Time: 12/24/18 10:54 PM  Result Value Ref Range   SARS Coronavirus 2 NEGATIVE NEGATIVE    Comment: (NOTE) If result is NEGATIVE SARS-CoV-2 target nucleic acids are NOT DETECTED. The SARS-CoV-2 RNA is generally detectable in upper and lower  respiratory specimens during the acute phase of infection. The lowest  concentration of SARS-CoV-2 viral copies this assay can detect is 250  copies / mL. A negative result does not preclude SARS-CoV-2 infection  and should not be used as the sole basis for treatment or other  patient management decisions.  A negative result may occur with  improper specimen collection / handling, submission of specimen other  than nasopharyngeal swab, presence of viral mutation(s) within the  areas targeted by this assay, and inadequate number of viral copies  (<250 copies / mL). A negative result must be combined with clinical  observations, patient history, and epidemiological information. If result is POSITIVE SARS-CoV-2 target nucleic acids are DETECTED. The SARS-CoV-2 RNA is generally detectable in upper and lower  respiratory specimens dur ing the acute phase of infection.  Positive  results are indicative of active infection with SARS-CoV-2.  Clinical  correlation with patient history and other diagnostic information is  necessary to determine patient infection status.  Positive results do  not rule out bacterial infection or co-infection with other viruses. If result is PRESUMPTIVE POSTIVE SARS-CoV-2 nucleic acids MAY BE PRESENT.   A presumptive positive result was obtained on the submitted specimen  and confirmed on repeat testing.  While 2019 novel  coronavirus  (SARS-CoV-2) nucleic acids may be present in the submitted sample  additional confirmatory testing may be necessary for epidemiological  and / or clinical management purposes  to differentiate between  SARS-CoV-2 and other Sarbecovirus currently known to infect humans.  If clinically indicated additional testing with an alternate test  methodology [phone]) is advised. The SARS-CoV-2 RNA is generally  detectable in upper and lower respiratory sp ecimens during the acute  phase of infection. The expected result is Negative. Fact Sheet for Patients:  BoilerBrush.com.cy Fact Sheet for Healthcare Providers: https://pope.com/ This test is not yet approved or cleared by the [place] FDA and has been authorized for detection and/or diagnosis of SARS-CoV-2 by FDA under an Emergency Use Authorization (EUA).  This EUA will remain in effect (meaning this test can be used) for the duration of the COVID-19 declaration under Section 564(b)(1) of the Act, 21 U.S.C. section 360bbb-3(b)(1), unless the authorization is terminated or revoked sooner. Performed at [facility], [address], [place], [place] [address]   Urine rapid drug screen (hosp performed)     Status: Abnormal   Collection Time: 12/25/18  1:38 AM  Result Value Ref Range   Opiates NONE DETECTED NONE DETECTED   Cocaine POSITIVE (A) NONE DETECTED   Benzodiazepines NONE DETECTED NONE DETECTED   Amphetamines NONE DETECTED NONE DETECTED   Tetrahydrocannabinol NONE DETECTED NONE DETECTED   Barbiturates NONE DETECTED NONE DETECTED    Comment: (NOTE) DRUG SCREEN FOR MEDICAL PURPOSES ONLY.  IF CONFIRMATION IS NEEDED FOR ANY PURPOSE, NOTIFY LAB WITHIN 5 DAYS. LOWEST DETECTABLE LIMITS FOR URINE DRUG SCREEN Drug Class                     Cutoff (ng/mL) Amphetamine and metabolites    1000 Barbiturate and metabolites    200 Benzodiazepine  200 Tricyclics and metabolites     300 Opiates and metabolites        300 Cocaine and metabolites        300 THC                            50 Performed at [facility], [address] [place], [place] [address]    Dg Chest Port 1 View  Result Date: 12/24/2018 CLINICAL DATA:  CHF EXAM: PORTABLE CHEST 1 VIEW COMPARISON:  11/29/2015 FINDINGS: Cardiomegaly. No confluent airspace opacities, effusions or edema. No acute bony abnormality. IMPRESSION: Cardiomegaly.  No active disease. Electronically Signed   By: Charlett Nose M.D.   On: 12/24/2018 22:58    Pending Labs Unresulted Labs (From admission, onward)    Start     Ordered   12/26/18 0430  Troponin I - Now Then Q6H  Now then every 6 hours,   R     12/25/18 0028   12/25/18 0500  Basic metabolic panel  Tomorrow morning,   R     12/25/18 0028   12/25/18 0500  CBC  Tomorrow morning,   R     12/25/18 0028   12/25/18 0500  Magnesium  Tomorrow morning,   R     12/25/18 0028   12/25/18 0027  HIV antibody (Routine Testing)  Add-on,   R     12/25/18 0028          Vitals/Pain Today's Vitals   12/25/18 0137 12/25/18 0200 12/25/18 0230 12/25/18 0256  BP:  (!) 131/101 (!) 128/103 (!) 128/103  Pulse:   (!) 53   Resp:  (!) 21 (!) 21 (!) 21  Temp: 98.5 F (36.9 C)     TempSrc: Axillary     SpO2:   100% 100%  PainSc:        Isolation Precautions No active isolations  Medications Medications  heparin injection 5,000 Units (has no administration in time range)  acetaminophen (TYLENOL) tablet 650 mg (has no administration in time range)    Or  acetaminophen (TYLENOL) suppository 650 mg (has no administration in time range)  aspirin EC tablet 81 mg (has no administration in time range)  furosemide (LASIX) injection 40 mg (has no administration in time range)  albuterol (VENTOLIN HFA) 108 (90 Base) MCG/ACT inhaler 1-2 puff (has no administration in time range)  mometasone-formoterol (DULERA) 100-5 MCG/ACT inhaler 2  puff (2 puffs Inhalation Not Given 12/25/18 0257)  digoxin (LANOXIN) tablet 0.125 mg (has no administration in time range)  losartan (COZAAR) tablet 25 mg (has no administration in time range)  spironolactone (ALDACTONE) tablet 25 mg (has no administration in time range)  nicotine (NICODERM CQ - dosed in mg/24 hours) patch 14 mg (has no administration in time range)  atorvastatin (LIPITOR) tablet 40 mg (has no administration in time range)  sodium chloride flush (NS) 0.9 % injection 3 mL (3 mLs Intravenous Given 12/24/18 2306)  furosemide (LASIX) injection 40 mg (40 mg Intravenous Given 12/24/18 2306)    Mobility non-ambulatory Low fall risk   Focused Assessments Cardiac Assessment Handoff:    Lab Results  Component Value Date   TROPONINI 0.08 (HH) 12/24/2018   Lab Results  Component Value Date   DDIMER 5.11 (H) 11/10/2017   Does the Patient currently have chest pain? No      R Recommendations: See Admitting Provider Note  Report given to:   Additional Notes: none

## 2018-12-25 NOTE — Consult Note (Addendum)
Cardiology Consultation:   Patient ID: Mark [name] MRN: [id number]; DOB: [birth year]  Admit date: 12/24/2018 Date of Consult: 12/25/2018  Primary Care Provider: Massie Maroon, FNP Primary Cardiologist: Verne Carrow, MD , Dr [name]   Patient Profile:   Mark [name] is a 43 y.o. male with a hx of NICM, noncompliance, polysubstance abuse artery who is being seen today for the evaluation of acute on chronic combined systolic diastolic CHF in the settings of medication noncompliance at the request of Nettey.  History of Present Illness:   Mark [name]  is a 43 y.o. male with medical history significant for chronic systolic CHF (EF 03-83% with severe diffuse hypokinesis by TTE 09/28/2017), NICM by cardiac cath 2017, history of right MCA CVA 2019, asthma, and substance use who presents to the ED for progressive shortness of breath.  Patient states he is running low on his home Lasix which he has been cutting in half in order to extend the duration of his medications.  He ran out completely about 4 days ago.  He has had progressive swelling of both of his legs as well as shortness of breath and orthopnea.  He denies any chest pain, cough, fever, dysuria. He for smoking with 1 pack of cigarettes lasting about 3-4 days.  He denies a significant alcohol use.  He admits to occasional cocaine use and says last use was about 2 weeks ago.  He has been followed by Dr [name] in 8/2019in CHF clinic and scheduled for frequent follow up including paramedicine however the patient didn't follow up.  PMH: Presented to ER in 5/17 with respiratory failure. Found to have new-onset systolic HF. EF 20-25%. Cath at that time with normal cors.  RV: 46/19 PA: 45/25 (33) PW: 22 AO: 98/74 LV: 97/12/29 Oxygen saturation: AO 91%; PA 41% Cardiac output: 3.13 (Fick); 5.6 L/m (thermodilution) Cardiac index: 1.53 2.75 liters per minute per meter squared  Shortly after leaving the hospital was incarcerated  for 14 months until 8/18. Was on HF meds in prison and did relatively well. Readmitted in 1/19 with recurrent HF. UDS + for cocaine.  LVEF 20%. Diuresed and sent home. Readmitted 09/28/17 for recurrent respiratory failure requiring BiPAP and acute encephaolapthy. Found to have dense left hemiparesis. MRI revealed acute right MCA CVA. CIR recommended but patient left AMA.   Repeat echo 09/28/17 EF 10-15%.   Re-admitted 3/21 - 11/14/17 with SOB and CP. Troponin mildly elevated. No EKG changes. BNP 3000. Diuresed with IV lasix. UDS negative that admit. He refused to take his medications at several intervals during this admit. Dr. Delton See spoke with Dr. [name]. HF follow up arranged.   Past Medical History:  Diagnosis Date  . Asthma   . Chronic systolic CHF (congestive heart failure) (HCC)   . Cigarette smoker   . CKD (chronic kidney disease), stage II    Mark [name] 10/01/2017  . History of echocardiogram    a. Echo 5/17 - EF 20-25%, severe diff HK, restrictive physiology, mild to mod MR, severe reduced RVSF, mod RVE, mild RAE, mod TR, PASP 48 mmHg  . Hx of cardiac cath    a. LHC 5/17 - normal coronary arteries. PA 45/25, mean 33, PCWP mean 18  . NICM (nonischemic cardiomyopathy) (HCC)   . Stroke (HCC) 09/27/2017   "was weak on my left side; I'm fully recovered" (11/09/2017)  . Substance abuse (HCC)    cocaine, marijuana    Past Surgical History:  Procedure Laterality Date  . CARDIAC CATHETERIZATION  N/A 01/05/2016   Procedure: Right/Left Heart Cath and Coronary Angiography;  Surgeon: Lennette Bihari, MD;  Location: [facility] INVASIVE CV LAB;  Service: Cardiovascular;  Laterality: N/A;    Inpatient Medications: Scheduled Meds: . aspirin EC  81 mg Oral Daily  . atorvastatin  40 mg Oral q1800  . digoxin  0.125 mg Oral Daily  . [START ON 12/26/2018] furosemide  40 mg Intravenous Q12H  . heparin  5,000 Units Subcutaneous Q8H  . [START ON 12/26/2018] losartan  25 mg Oral QHS  . mometasone-formoterol  2  puff Inhalation BID  . nicotine  14 mg Transdermal Daily  . spironolactone  25 mg Oral Daily   Continuous Infusions:  PRN Meds: acetaminophen **OR** acetaminophen, albuterol, perflutren lipid microspheres (DEFINITY) IV suspension  Allergies:    Allergies  Allergen Reactions  . Hydrocodone Hives  . Lisinopril Swelling and Other (See Comments)    Facial swelling/angioedema  . Prednisone Shortness Of Breath, Nausea Only, Swelling and Other (See Comments)    Also made chest feel tight and genital area, legs, and face became swollen badly  . Penicillins Hives and Swelling     Has patient had a PCN reaction causing immediate rash, facial/tongue/throat swelling, SOB or lightheadedness with hypotension: Yes Has patient had a PCN reaction causing severe rash involving mucus membranes or skin necrosis: No Has patient had a PCN reaction that required hospitalization: No Has patient had a PCN reaction occurring within the last 10 years: No If all of the above answers are "NO", then may proceed with Cephalosporin use.     Social History:   Social History   Socioeconomic History  . Marital status: Divorced    Spouse name: Not on file  . Number of children: Not on file  . Years of education: Not on file  . Highest education level: Not on file  Occupational History  . Not on file  Social Needs  . Financial resource strain: Not on file  . Food insecurity:    Worry: Not on file    Inability: Not on file  . Transportation needs:    Medical: Not on file    Non-medical: Not on file  Tobacco Use  . Smoking status: Former Smoker    Packs/day: 1.00    Years: 30.00    Pack years: 30.00    Types: Cigarettes    Last attempt to quit: 09/27/2017    Years since quitting: 1.2  . Smokeless tobacco: Never Used  Substance and Sexual Activity  . Alcohol use: Yes    Alcohol/week: 5.0 standard drinks    Types: 5 Shots of liquor per week    Frequency: Never    Comment: 5-6 shots of vodka daily;  "stopped it all after I had stroke 09/27/2017"  . Drug use: Yes    Types: Cocaine, Marijuana    Comment: 11/09/2017 "none since 09/27/2017"  . Sexual activity: Not Currently  Lifestyle  . Physical activity:    Days per week: Not on file    Minutes per session: Not on file  . Stress: Not on file  Relationships  . Social connections:    Talks on phone: Not on file    Gets together: Not on file    Attends religious service: Not on file    Active member of club or organization: Not on file    Attends meetings of clubs or organizations: Not on file    Relationship status: Not on file  . Intimate partner violence:  Fear of current or ex partner: Not on file    Emotionally abused: Not on file    Physically abused: Not on file    Forced sexual activity: Not on file  Other Topics Concern  . Not on file  Social History Narrative  . Not on file    Family History:    Family History  Problem Relation Age of Onset  . Cardiomyopathy Father        Reports his father has an LVAD  . Heart failure Father   . Hypertension Father   . Deep vein thrombosis Neg Hx      ROS:  Please see the history of present illness.  All other ROS reviewed and negative.     Physical Exam/Data:   Vitals:   12/25/18 0900 12/25/18 1000 12/25/18 1200 12/25/18 1206  BP: 93/73 96/76 (!) 130/112 (!) 112/95  Pulse: (!) 102 100 (!) 104 (!) 106  Resp: 15 (!) 22 (!) 26 (!) 21  Temp:   (!) 97.2 F (36.2 C)   TempSrc:   Axillary   SpO2: 100% 93% 100% 99%  Weight:      Height:        Intake/Output Summary (Last 24 hours) at 12/25/2018 1428 Last data filed at 12/25/2018 1200 Gross per 24 hour  Intake 723 ml  Output 250 ml  Net 473 ml   Last 3 Weights 12/25/2018 04/17/2018 03/08/2018  Weight (lbs) 159 lb 2.8 oz 147 lb 4.8 oz 140 lb  Weight (kg) 72.2 kg 66.815 kg 63.504 kg     Body mass index is 22.2 kg/m.  General:  Well nourished, well developed, in no acute distress HEENT: normal Lymph: no adenopathy Neck:  +6 JVD B/L Endocrine:  No thryomegaly Vascular: No carotid bruits; FA pulses 2+ bilaterally without bruits  Cardiac:  normal S1, S2; RRR; 4/6 murmur  Lungs:  clear to auscultation bilaterally, no wheezing, rhonchi or rales  Abd: soft, nontender, no hepatomegaly  Ext: no edema Musculoskeletal:  No deformities, BUE and BLE strength normal and equal Skin: warm and dry  Neuro:  CNs 2-12 intact, no focal abnormalities noted Psych:  Normal affect   EKG:  The EKG was personally reviewed and demonstrates:  ST, LAFB Telemetry:  Telemetry was personally reviewed and demonstrates:  SR, PVCs, 1 run of sVT with rate 150 BPM  Relevant CV Studies: TTE 12/25/2018  SUMMARY   Moderate LV dilatation and severe systolic dysfunction with LVEF 5-10% and diffuse hypokinesis. 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 Grade 2 diastolic dysfunction with severely elevated filling pressures.   Right ventricle is severely dilated and now with severe systolic dysfunction (previously mild to moderate). RVSP is 42 mmHg consistent with moderate pulmonary hypertension, however underestimated in teh settings of severe RV dysfunction. RVSP previously 56 mmHg.   Moderate to severe mitral and severe tricuspid regurgitation.  Laboratory Data:  Chemistry Recent Labs  Lab 12/24/18 2233 12/25/18 0641  NA 135 136  K 4.3 4.9  CL 98 98  CO2 27 29  GLUCOSE 97 112*  BUN 31* 32*  CREATININE 1.15 1.28*  CALCIUM 8.9 9.1  GFRNONAA >60 >60  GFRAA >60 >60  ANIONGAP 10 9    Recent Labs  Lab 12/24/18 2237  PROT 7.7  ALBUMIN 3.3*  AST 55*  ALT 31  ALKPHOS 112  BILITOT 2.4*   Hematology Recent Labs  Lab 12/24/18 2233 12/25/18 0641  WBC 6.7 9.0  RBC 4.95 5.25  HGB 12.9* 13.4  HCT 41.1 42.8  MCV 83.0 81.5  MCH 26.1 25.5*  MCHC 31.4 31.3  RDW 18.9* 18.6*  PLT 189 174   Cardiac Enzymes Recent Labs  Lab 12/24/18 2237  TROPONINI 0.08*   No results for input(s): TROPIPOC in the  last 168 hours.  BNP Recent Labs  Lab 12/24/18 2237  BNP 3,973.6*    DDimer No results for input(s): DDIMER in the last 168 hours.  Radiology/Studies:  Dg Chest Port 1 View  Result Date: 12/24/2018 CLINICAL DATA:  CHF EXAM: PORTABLE CHEST 1 VIEW COMPARISON:  11/29/2015 FINDINGS: Cardiomegaly. No confluent airspace opacities, effusions or edema. No acute bony abnormality. IMPRESSION: Cardiomegaly.  No active disease. Electronically Signed   By: Charlett Nose M.D.   On: 12/24/2018 22:58    Assessment and Plan:   1. Acute on Chronic systolic CHF, Echo 09/2017 LVEF 5-10%, severe RV systolic dysfunction - NICM by cath 2017. Suspect etiology is polysubstance abuse - NYHA III, - BNP 3973 - Crea 1.28 - baseline weight 147 lbs, on admission 161 losartan - he has not been taking any meds at home - restarted on ASA, atorvastatin, digoxin, spironolactone - add losartan 25 mg po daily - increase lasix to 40 mg iv Q6H - Not a candidate for advanced therapies down the road and need to remain off substances  2. LV thrombus - h/o CVA - he should be started on coumadin however this is very worrisome given no-compliance  3. sVT - 19 beats today - we will start BB once more compensated, BB not ideal given low flow - not candidate for ICD given non-compliance and polysubstance abuse  4. Polysubstance abuse - UDS positive for cocaine  4. 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 There is associated slow/absent flow within the associated right M2 branch. - No lasting deficit.  - He is on ASA and stain alone with poor prognosis. He has not been taking statin. - Likely cardio-embolic. Ideally would be on warfarin but not compliant enough at this point. Reconsider in future if compliance improves.   For questions or updates, please contact [facility] Please consult [email or web address] for contact info under   Signed, Tobias Alexander,  MD  12/25/2018 2:28 PM

## 2018-12-25 NOTE — H&P (Signed)
History and Physical    LOUI [name] BJY:[id number] DOB: 1976-02-10 DOA: 12/24/2018  PCP: Massie Maroon, FNP  Patient coming from: Home  I have personally briefly reviewed patient's old medical records in [facility]  Chief Complaint: Shortness of breath  HPI: Mark [name] is a 43 y.o. male with medical history significant for chronic systolic CHF (EF 08-65% with severe diffuse hypokinesis by TTE 09/28/2017), NICM by cardiac cath 2017, history of right MCA CVA 2019, asthma, and substance use who presents to the ED for progressive shortness of breath.  Patient states he is running low on his home Lasix which he has been cutting in half in order to extend the duration of his medications.  He ran out completely about 4 days ago.  He has had progressive swelling of both of his legs as well as shortness of breath and orthopnea.  He denies any chest pain, cough, fever, dysuria.  He for smoking with 1 pack of cigarettes lasting about 3-4 days.  He denies a significant alcohol use.  He admits to occasional cocaine use and says last use was about 2 weeks ago.  ED Course:  Initial vitals showed BP 114/94, pulse 110, RR 25, temp 97.6 Fahrenheit, SPO2 100% on nonrebreather.  Labs notable for WBC 6.7, hemoglobin 12.9, platelets 189, potassium 4.3, BUN 31, creatinine 1.15, troponin I 0.08, BNP 3973.6, digoxin level 0.5, SARS-CoV-2 testing negative.  Portable chest x-ray showed enlarged cardiac silhouette without focal consolidation, pleural effusion or interstitial edema.  Patient was given IV Lasix 40 mg x 1 and placed on BiPAP with improvement in respiratory status.  The hospital service was consulted to admit for further evaluation and management.  Review of Systems: As per HPI otherwise 10 point review of systems negative.    Past Medical History:  Diagnosis Date   Asthma    Chronic systolic CHF (congestive heart failure) (HCC)    Cigarette smoker    CKD (chronic kidney  disease), stage II    /notes 10/01/2017   History of echocardiogram    a. Echo 5/17 - EF 20-25%, severe diff HK, restrictive physiology, mild to mod MR, severe reduced RVSF, mod RVE, mild RAE, mod TR, PASP 48 mmHg   Hx of cardiac cath    a. LHC 5/17 - normal coronary arteries. PA 45/25, mean 33, PCWP mean 18   NICM (nonischemic cardiomyopathy) (HCC)    Stroke (HCC) 09/27/2017   "was weak on my left side; I'm fully recovered" (11/09/2017)   Substance abuse (HCC)    cocaine, marijuana    Past Surgical History:  Procedure Laterality Date   CARDIAC CATHETERIZATION N/A 01/05/2016   Procedure: Right/Left Heart Cath and Coronary Angiography;  Surgeon: Lennette Bihari, MD;  Location: [facility] INVASIVE CV LAB;  Service: Cardiovascular;  Laterality: N/A;     reports that he quit smoking about 14 months ago. His smoking use included cigarettes. He has a 30.00 pack-year smoking history. He has never used smokeless tobacco. He reports current alcohol use of about 5.0 standard drinks of alcohol per week. He reports current drug use. Drugs: Cocaine and Marijuana.  Allergies  Allergen Reactions   Hydrocodone Hives   Lisinopril Swelling and Other (See Comments)    Facial swelling/angioedema   Prednisone Shortness Of Breath, Nausea Only, Swelling and Other (See Comments)    Also made chest feel tight and genital area, legs, and face became swollen badly   Penicillins Hives and Swelling     Has  patient had a PCN reaction causing immediate rash, facial/tongue/throat swelling, SOB or lightheadedness with hypotension: Yes Has patient had a PCN reaction causing severe rash involving mucus membranes or skin necrosis: No Has patient had a PCN reaction that required hospitalization: No Has patient had a PCN reaction occurring within the last 10 years: No If all of the above answers are "NO", then may proceed with Cephalosporin use.     Family History  Problem Relation Age of Onset   Cardiomyopathy  Father        Reports his father has an LVAD   Heart failure Father    Hypertension Father    Deep vein thrombosis Neg Hx      Prior to Admission medications   Medication Sig Start Date End Date Taking? Authorizing Provider  albuterol (PROVENTIL HFA;VENTOLIN HFA) 108 (90 Base) MCG/ACT inhaler Inhale 1-2 puffs into the lungs every 4 (four) hours as needed for wheezing or shortness of breath. Patient not taking: Reported on 02/02/2018 10/12/17   Massie Maroon, FNP  aspirin 81 MG EC tablet Take 1 tablet (81 mg total) by mouth daily. 09/22/17   Rai, Delene Ruffini, MD  budesonide-formoterol (SYMBICORT) 80-4.5 MCG/ACT inhaler Inhale 2 puffs into the lungs 2 (two) times daily. 10/12/17   Massie Maroon, FNP  digoxin (LANOXIN) 0.125 MG tablet TAKE 1 TABLET (0.125 MG TOTAL) BY MOUTH DAILY. 07/09/18   Bensimhon, Bevelyn Buckles, MD  furosemide (LASIX) 80 MG tablet Take 0.5 tablets (40 mg total) by mouth daily. 12/07/17   Bensimhon, Bevelyn Buckles, MD  losartan (COZAAR) 25 MG tablet TAKE 1 TABLET (25 MG TOTAL) BY MOUTH AT BEDTIME. 07/09/18 10/07/18  Bensimhon, Bevelyn Buckles, MD  mometasone-formoterol (DULERA) 100-5 MCG/ACT AERO Inhale 2 puffs into the lungs 2 (two) times daily.    [provider]  Multiple Vitamin (MULTIVITAMIN WITH MINERALS) TABS tablet Take 1 tablet by mouth daily.    [provider]  spironolactone (ALDACTONE) 25 MG tablet Take 1 tablet (25 mg total) by mouth daily. 01/05/18 04/05/18  Alford Highland, NP  spironolactone (ALDACTONE) 25 MG tablet TAKE 1/2 TABLET BY MOUTH DAILY. 04/10/18   Bensimhon, Bevelyn Buckles, MD  spironolactone (ALDACTONE) 25 MG tablet TAKE 1/2 TABLET BY MOUTH ONCE DAILY 10/10/18   Bensimhon, Bevelyn Buckles, MD    Physical Exam: Vitals:   12/24/18 2234 12/24/18 2240 12/24/18 2245 12/24/18 2256  BP: (!) 120/99  (!) 114/94   Pulse: (!) 114  (!) 109 (!) 106  Resp: (!) 23  (!) 26 (!) 25  Temp: 97.6 F (36.4 C)     TempSrc: Axillary     SpO2: 100% 100% 100% 100%     Constitutional: Resting supine in bed while on BiPAP, NAD, calm, comfortable Eyes: PERRL, lids and conjunctivae normal ENMT: Mucous membranes are moist. Posterior pharynx clear of any exudate or lesions.Normal dentition.  Neck: normal, supple, no masses. Respiratory: On BiPAP, clear to auscultation bilaterally, no wheezing, no crackles. Normal respiratory effort. No accessory muscle use.  Cardiovascular: Regular rate and rhythm, no murmurs / rubs / gallops.  +2 edema bilateral lower extremities almost up to the knees, sacral edema. 2+  Abdomen: no tenderness, no masses palpated. No hepatosplenomegaly. Bowel sounds positive.  Musculoskeletal: no clubbing / cyanosis. No joint deformity upper and lower extremities. Good ROM, no contractures. Normal muscle tone.  Skin: no rashes, lesions, ulcers. No induration Neurologic: CN 2-12 grossly intact. Sensation intact, Strength 5/5 in all 4.  Psychiatric: Normal judgment and insight. Alert  and oriented x 3. Normal mood.     Labs on Admission: I have personally reviewed following labs and imaging studies  CBC: Recent Labs  Lab 12/24/18 2233  WBC 6.7  HGB 12.9*  HCT 41.1  MCV 83.0  PLT 189   Basic Metabolic Panel: Recent Labs  Lab 12/24/18 2233  NA 135  K 4.3  CL 98  CO2 27  GLUCOSE 97  BUN 31*  CREATININE 1.15  CALCIUM 8.9   GFR: CrCl cannot be calculated (Unknown ideal weight.). Liver Function Tests: Recent Labs  Lab 12/24/18 2237  AST 55*  ALT 31  ALKPHOS 112  BILITOT 2.4*  PROT 7.7  ALBUMIN 3.3*   No results for input(s): LIPASE, AMYLASE in the last 168 hours. No results for input(s): AMMONIA in the last 168 hours. Coagulation Profile: No results for input(s): INR, PROTIME in the last 168 hours. Cardiac Enzymes: Recent Labs  Lab 12/24/18 2237  TROPONINI 0.08*   BNP (last 3 results) No results for input(s): PROBNP in the last 8760 hours. HbA1C: No results for input(s): HGBA1C in the last 72  hours. CBG: No results for input(s): GLUCAP in the last 168 hours. Lipid Profile: No results for input(s): CHOL, HDL, LDLCALC, TRIG, CHOLHDL, LDLDIRECT in the last 72 hours. Thyroid Function Tests: No results for input(s): TSH, T4TOTAL, FREET4, T3FREE, THYROIDAB in the last 72 hours. Anemia Panel: No results for input(s): VITAMINB12, FOLATE, FERRITIN, TIBC, IRON, RETICCTPCT in the last 72 hours. Urine analysis:    Component Value Date/Time   COLORURINE AMBER (A) 11/11/2017 1615   APPEARANCEUR CLEAR 11/11/2017 1615   LABSPEC 1.020 11/11/2017 1615   PHURINE 5.0 11/11/2017 1615   GLUCOSEU NEGATIVE 11/11/2017 1615   HGBUR NEGATIVE 11/11/2017 1615   BILIRUBINUR negative 12/01/2017 0910   KETONESUR negative 12/01/2017 0910   KETONESUR NEGATIVE 11/11/2017 1615   PROTEINUR negative 12/01/2017 0910   PROTEINUR NEGATIVE 11/11/2017 1615   UROBILINOGEN 2.0 (A) 12/01/2017 0910   NITRITE Negative 12/01/2017 0910   NITRITE NEGATIVE 11/11/2017 1615   LEUKOCYTESUR Negative 12/01/2017 0910    Radiological Exams on Admission: Dg Chest Port 1 View  Result Date: 12/24/2018 CLINICAL DATA:  CHF EXAM: PORTABLE CHEST 1 VIEW COMPARISON:  11/29/2015 FINDINGS: Cardiomegaly. No confluent airspace opacities, effusions or edema. No acute bony abnormality. IMPRESSION: Cardiomegaly.  No active disease. Electronically Signed   By: Charlett NoseKevin  Dover M.D.   On: 12/24/2018 22:58    EKG: Independently reviewed. Sinus rhythm, late R wave transition, Q waves leads I and aVL.  Assessment/Plan Principal Problem:   Acute on chronic systolic (congestive) heart failure (HCC) Active Problems:   Asthma   Elevated troponin I level   History of right MCA stroke  Marcellus ScottJeremie C Thunder is a 43 y.o. male with medical history significant for chronic systolic CHF (EF 69-62%10-15% with severe diffuse hypokinesis by TTE 09/28/2017), NICM by cardiac cath 2017, history of right MCA CVA 2019, asthma, and substance use who is admitted with acute on  chronic systolic CHF exacerbation.   Acute on chronic systolic CHF exacerbation: EF 95-28%10-15% by echocardiogram February 2019, NICM by cardiac cath 2017.  Patient has significant volume overload on exam with improvement in respiratory symptoms on BiPAP. -Admit to stepdown unit for as needed BiPAP -Continue IV Lasix 40 mg twice daily -Resume home losartan, spironolactone, and digoxin -Monitor daily weights and strict I/O's -Update echocardiogram  Elevated troponin level: Mild troponin elevation of 0.08, likely due to demand ischemia.  He has chronically elevated troponin  on review of labs.  We will continue to trend.  History of right MCA stroke: No residual deficit.  Suspected to be cardioembolic, however he was not started on anticoagulation due to his history of nonadherence to medication.  He has been on aspirin 81 mg daily.  He was on statin in the past but does not appear to be taking it. -Continue aspirin 81 mg daily -Start atorvastatin 40 mg daily  Asthma: Not obviously wheezing on examination.  Respiratory symptoms more consistent with CHF exacerbation. -Continue Dulera and as needed albuterol  Substance use disorder/tobacco use: Patient acknowledges he needs to quit tobacco.  He admits to cocaine use with last use about 2 weeks ago. -Nicotine patch -Obtain UDS   DVT prophylaxis: SQ heparin Code Status: Full code Family Communication: None present on admission Disposition Plan: Pending clinical progress Consults called: None Admission status: Inpatient, patient likely requires greater than 2 midnight stay for several days of IV diuresis for management of acute on chronic systolic CHF exacerbation as he is high risk of decompensation with poor ejection fraction of 10%.   Darreld Mclean MD [facility] Pager [phone]  If 7PM-7AM, please contact night-coverage [email or web address]  12/25/2018, 1:22 AM

## 2018-12-25 NOTE — Progress Notes (Signed)
Patient seen and examined at bedside, patient admitted after midnight, please see earlier detailed admission note by Charlsie Quest, MD. Briefly, patient presented with dyspnea concerning for acute heart failure. Had a candid discussion with the patient with regard to his cocaine use and likely death from worsened heart failure. He also has poor follow-up and cannot give me good reasons for not being more diligent with his healthcare. He cannot tell me why he uses cocaine. May be best served at [facility] but outpatient follow-up is more his issue. Last note from heart failure team considering ICD placement if EF remained low. Transthoracic Echocardiogram ordered this admission and is pending. Will continue IV diuresis with lasix 40 mg BID, wean oxygen and transfer patient to telemetry today. On exam, patient is still significantly overloaded and dry weight is between 140- 145 lbs. Weight on admission of 159 lbs.   Jacquelin Hawking, MD [facility] 12/25/2018, 11:46 AM

## 2018-12-26 ENCOUNTER — Inpatient Hospital Stay (HOSPITAL_COMMUNITY): Payer: Medicaid Other

## 2018-12-26 DIAGNOSIS — D649 Anemia, unspecified: Secondary | ICD-10-CM

## 2018-12-26 DIAGNOSIS — F1920 Other psychoactive substance dependence, uncomplicated: Secondary | ICD-10-CM

## 2018-12-26 DIAGNOSIS — J45909 Unspecified asthma, uncomplicated: Secondary | ICD-10-CM

## 2018-12-26 DIAGNOSIS — D72829 Elevated white blood cell count, unspecified: Secondary | ICD-10-CM

## 2018-12-26 LAB — CBC
HCT: 37.6 % — ABNORMAL LOW (ref 39.0–52.0)
Hemoglobin: 12.3 g/dL — ABNORMAL LOW (ref 13.0–17.0)
MCH: 26.3 pg (ref 26.0–34.0)
MCHC: 32.7 g/dL (ref 30.0–36.0)
MCV: 80.5 fL (ref 80.0–100.0)
Platelets: 172 10*3/uL (ref 150–400)
RBC: 4.67 MIL/uL (ref 4.22–5.81)
RDW: 18.1 % — ABNORMAL HIGH (ref 11.5–15.5)
WBC: 21.7 10*3/uL — ABNORMAL HIGH (ref 4.0–10.5)
nRBC: 0.1 % (ref 0.0–0.2)

## 2018-12-26 LAB — BASIC METABOLIC PANEL
Anion gap: 9 (ref 5–15)
BUN: 39 mg/dL — ABNORMAL HIGH (ref 6–20)
CO2: 29 mmol/L (ref 22–32)
Calcium: 8.6 mg/dL — ABNORMAL LOW (ref 8.9–10.3)
Chloride: 95 mmol/L — ABNORMAL LOW (ref 98–111)
Creatinine, Ser: 1.26 mg/dL — ABNORMAL HIGH (ref 0.61–1.24)
GFR calc Af Amer: >60 mL/min (ref >60)
GFR calc non Af Amer: >60 mL/min (ref >60)
Glucose, Bld: 108 mg/dL — ABNORMAL HIGH (ref 70–99)
Potassium: 3.9 mmol/L (ref 3.5–5.1)
Sodium: 133 mmol/L — ABNORMAL LOW (ref 135–145)

## 2018-12-26 LAB — COMPREHENSIVE METABOLIC PANEL
ALT: 25 U/L (ref 0–44)
AST: 44 U/L — ABNORMAL HIGH (ref 15–41)
Albumin: 2.8 g/dL — ABNORMAL LOW (ref 3.5–5.0)
Alkaline Phosphatase: 91 U/L (ref 38–126)
Anion gap: 11 (ref 5–15)
BUN: 37 mg/dL — ABNORMAL HIGH (ref 6–20)
CO2: 27 mmol/L (ref 22–32)
Calcium: 8.5 mg/dL — ABNORMAL LOW (ref 8.9–10.3)
Chloride: 95 mmol/L — ABNORMAL LOW (ref 98–111)
Creatinine, Ser: 1.26 mg/dL — ABNORMAL HIGH (ref 0.61–1.24)
GFR calc Af Amer: >60 mL/min (ref >60)
GFR calc non Af Amer: >60 mL/min (ref >60)
Glucose, Bld: 123 mg/dL — ABNORMAL HIGH (ref 70–99)
Potassium: 3.9 mmol/L (ref 3.5–5.1)
Sodium: 133 mmol/L — ABNORMAL LOW (ref 135–145)
Total Bilirubin: 2.5 mg/dL — ABNORMAL HIGH (ref 0.3–1.2)
Total Protein: 6.5 g/dL (ref 6.5–8.1)

## 2018-12-26 LAB — TROPONIN I
Troponin I: 0.07 ng/mL (ref <0.03)
Troponin I: 0.06 ng/mL (ref <0.03)

## 2018-12-26 LAB — HEPARIN LEVEL (UNFRACTIONATED)
Heparin Unfractionated: 0.54 IU/mL (ref 0.30–0.70)
Heparin Unfractionated: 0.59 IU/mL (ref 0.30–0.70)
Heparin Unfractionated: 0.6 IU/mL (ref 0.30–0.70)
Heparin Unfractionated: 0.8 IU/mL — ABNORMAL HIGH (ref 0.30–0.70)

## 2018-12-26 LAB — MAGNESIUM: Magnesium: 1.9 mg/dL (ref 1.7–2.4)

## 2018-12-26 LAB — PROTIME-INR
INR: 1.9 — ABNORMAL HIGH (ref 0.8–1.2)
Prothrombin Time: 21.9 seconds — ABNORMAL HIGH (ref 11.4–15.2)

## 2018-12-26 LAB — PHOSPHORUS: Phosphorus: 2.4 mg/dL — ABNORMAL LOW (ref 2.5–4.6)

## 2018-12-26 LAB — LACTIC ACID, PLASMA
Lactic Acid, Venous: 1.4 mmol/L (ref 0.5–1.9)
Lactic Acid, Venous: 2.1 mmol/L (ref 0.5–1.9)

## 2018-12-26 MED ORDER — FENTANYL CITRATE (PF) 100 MCG/2ML IJ SOLN
12.5000 ug | Freq: Once | INTRAMUSCULAR | Status: AC
  Administered 2018-12-26: 12.5 ug via INTRAVENOUS
  Filled 2018-12-26: qty 2

## 2018-12-26 MED ORDER — SODIUM CHLORIDE 0.9 % IV BOLUS
250.0000 mL | Freq: Once | INTRAVENOUS | Status: AC
  Administered 2018-12-26: 250 mL via INTRAVENOUS

## 2018-12-26 MED ORDER — CHLORHEXIDINE GLUCONATE CLOTH 2 % EX PADS
6.0000 | MEDICATED_PAD | Freq: Every day | CUTANEOUS | Status: DC
  Administered 2018-12-26 – 2018-12-29 (×3): 6 via TOPICAL

## 2018-12-26 MED ORDER — CLINDAMYCIN PHOSPHATE 600 MG/50ML IV SOLN
600.0000 mg | Freq: Three times a day (TID) | INTRAVENOUS | Status: DC
  Administered 2018-12-26 – 2018-12-28 (×6): 600 mg via INTRAVENOUS
  Filled 2018-12-26 (×6): qty 50

## 2018-12-26 MED ORDER — WARFARIN SODIUM 2.5 MG PO TABS
2.5000 mg | ORAL_TABLET | Freq: Once | ORAL | Status: AC
  Administered 2018-12-26: 2.5 mg via ORAL
  Filled 2018-12-26: qty 1

## 2018-12-26 MED ORDER — K PHOS MONO-SOD PHOS DI & MONO 155-852-130 MG PO TABS
500.0000 mg | ORAL_TABLET | Freq: Once | ORAL | Status: AC
  Administered 2018-12-26: 500 mg via ORAL
  Filled 2018-12-26: qty 2

## 2018-12-26 MED ORDER — HEPARIN (PORCINE) 25000 UT/250ML-% IV SOLN
1200.0000 [IU]/h | INTRAVENOUS | Status: DC
  Administered 2018-12-27 – 2018-12-28 (×2): 1200 [IU]/h via INTRAVENOUS
  Filled 2018-12-26 (×3): qty 250

## 2018-12-26 MED ORDER — METOLAZONE 2.5 MG PO TABS
2.5000 mg | ORAL_TABLET | Freq: Once | ORAL | Status: AC
  Administered 2018-12-26: 2.5 mg via ORAL
  Filled 2018-12-26: qty 1

## 2018-12-26 NOTE — Progress Notes (Signed)
Progress Note  Patient Name: Mark [name] Date of Encounter: 12/26/2018  Primary Cardiologist: Verne Carrow, MD   Subjective   He feels slightly better today.   Inpatient Medications    Scheduled Meds: . aspirin EC  81 mg Oral Daily  . atorvastatin  40 mg Oral q1800  . Chlorhexidine Gluconate Cloth  6 each Topical Daily  . digoxin  0.125 mg Oral Daily  . furosemide  40 mg Intravenous Q6H  . losartan  25 mg Oral QHS  . mometasone-formoterol  2 puff Inhalation BID  . nicotine  14 mg Transdermal Daily  . spironolactone  25 mg Oral Daily  . Warfarin - Pharmacist Dosing Inpatient   Does not apply q1800   Continuous Infusions: . heparin 1,300 Units/hr (12/26/18 0600)   PRN Meds: acetaminophen **OR** acetaminophen, albuterol   Vital Signs    Vitals:   12/26/18 0415 12/26/18 0500 12/26/18 0600 12/26/18 0800  BP: 108/76 107/85 109/86 120/67  Pulse: (!) 107 (!) 103 (!) 102 99  Resp: 15 (!) 25 (!) 22 (!) 23  Temp:    97.9 F (36.6 C)  TempSrc:    Oral  SpO2: 98% 96% 96% 96%  Weight:      Height:        Intake/Output Summary (Last 24 hours) at 12/26/2018 0849 Last data filed at 12/26/2018 0830 Gross per 24 hour  Intake 1852.64 ml  Output 2215 ml  Net -362.36 ml   Last 3 Weights 12/26/2018 12/25/2018 04/17/2018  Weight (lbs) 164 lb 3.9 oz 159 lb 2.8 oz 147 lb 4.8 oz  Weight (kg) 74.5 kg 72.2 kg 66.815 kg     Telemetry    SR, PVCs - Personally Reviewed  ECG    No new - Personally Reviewed  Physical Exam   GEN: No acute distress.   Neck: +4 cm JVD Cardiac: RRR, 4/6 systolic murmurs, + S4, or gallops.  Respiratory: crackles to auscultation bilaterally. GI: Soft, nontender, non-distended  MS: + 3 edema B/L up to knees; No deformity. Neuro:  Nonfocal  Psych: Normal affect   Labs    Chemistry Recent Labs  Lab 12/24/18 2233 12/24/18 2237 12/25/18 0641 12/25/18 2359  NA 135  --  136 133*  K 4.3  --  4.9 3.9  CL 98  --  98 95*  CO2 27  --  29 29   GLUCOSE 97  --  112* 108*  BUN 31*  --  32* 39*  CREATININE 1.15  --  1.28* 1.26*  CALCIUM 8.9  --  9.1 8.6*  PROT  --  7.7  --   --   ALBUMIN  --  3.3*  --   --   AST  --  55*  --   --   ALT  --  31  --   --   ALKPHOS  --  112  --   --   BILITOT  --  2.4*  --   --   GFRNONAA >60  --  >60 >60  GFRAA >60  --  >60 >60  ANIONGAP 10  --  9 9     Hematology Recent Labs  Lab 12/24/18 2233 12/25/18 0641 12/26/18 0417  WBC 6.7 9.0 21.7*  RBC 4.95 5.25 4.67  HGB 12.9* 13.4 12.3*  HCT 41.1 42.8 37.6*  MCV 83.0 81.5 80.5  MCH 26.1 25.5* 26.3  MCHC 31.4 31.3 32.7  RDW 18.9* 18.6* 18.1*  PLT 189 174 172  Cardiac Enzymes Recent Labs  Lab 12/24/18 2237 12/26/18 0417  TROPONINI 0.08* 0.07*   No results for input(s): TROPIPOC in the last 168 hours.   BNP Recent Labs  Lab 12/24/18 2237  BNP 3,973.6*     DDimer No results for input(s): DDIMER in the last 168 hours.   Radiology    Dg Chest Port 1 View  Result Date: 12/24/2018 CLINICAL DATA:  CHF EXAM: PORTABLE CHEST 1 VIEW COMPARISON:  11/29/2015 FINDINGS: Cardiomegaly. No confluent airspace opacities, effusions or edema. No acute bony abnormality. IMPRESSION: Cardiomegaly.  No active disease. Electronically Signed   By: Charlett Nose M.D.   On: 12/24/2018 22:58    Cardiac Studies   TTE; 12/25/18 Moderate LV dilatation and severe systolic dysfunction with LVEF 5-10% and diffuse hypokinesis. 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 Grade 2 diastolic dysfunction with severely elevated filling pressures.   Right ventricle is severely dilated and now with severe systolic dysfunction (previously mild to moderate). RVSP is 42 mmHg consistent with moderate pulmonary hypertension, however underestimated in teh settings of severe RV dysfunction. RVSP previously 56 mmHg.   Moderate to severe mitral and severe tricuspid regurgitation.  Patient Profile     43 y.o. male   Assessment & Plan    1. Acute  on Chronic systolic CHF, Echo 09/2017 LVEF 5-10%, severe RV systolic dysfunction - NICM by cath 2017.Suspect etiology is polysubstance abuse - NYHAIII, - BNP 3973 - Crea 1.28->1.26 - baseline weight 147 lbs, on admission 161, now down to 156 lbs - he has not been taking any meds at home - restarted on ASA, atorvastatin, digoxin, spironolactone - added losartan 25 mg po daily - continue lasix to 40 mg iv Q6H, add metolazone 2.5 mg po x 1 - Not a candidate for advanced therapies down the road and need to remain off substances  2. LV thrombus - h/o CVA - he should be started on coumadin however this is very worrisome given no-compliance  3. sVT - 19 beats today - we will start BB once more compensated, BB not ideal given low flow - not candidate for ICD given non-compliance and polysubstance abuse  4. Polysubstance abuse - UDS positive for cocaine  4. 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 There is associated slow/absent flow within the associated right M2 branch. - No lasting deficit.  - He is on ASA and stain alone with poor prognosis. He has not been taking statin. - Likely cardio-embolic. Ideally would be on warfarin but not compliant enough at this point. Reconsider in future if compliance improves.  For questions or updates, please contact [facility] Please consult [email or web address] for contact info under     Signed, Tobias Alexander, MD  12/26/2018, 8:49 AM

## 2018-12-26 NOTE — Progress Notes (Addendum)
CRITICAL VALUE ALERT  Critical Value: lactic acid 2.1  Date & Time Notied:  0100 12/26/18  Provider Notified: Kirtland Bouchard Schorr  Orders Received/Actions taken: 250 bolus Lactic acid plasma

## 2018-12-26 NOTE — Progress Notes (Signed)
ANTICOAGULATION CONSULT NOTE - Follow Up Consult  Pharmacy Consult for Heparin Indication: LV thrombus  Allergies  Allergen Reactions  . Hydrocodone Hives  . Lisinopril Swelling and Other (See Comments)    Facial swelling/angioedema  . Prednisone Shortness Of Breath, Nausea Only, Swelling and Other (See Comments)    Also made chest feel tight and genital area, legs, and face became swollen badly  . Penicillins Hives and Swelling     Has patient had a PCN reaction causing immediate rash, facial/tongue/throat swelling, SOB or lightheadedness with hypotension: Yes Has patient had a PCN reaction causing severe rash involving mucus membranes or skin necrosis: No Has patient had a PCN reaction that required hospitalization: No Has patient had a PCN reaction occurring within the last 10 years: No If all of the above answers are "NO", then may proceed with Cephalosporin use.     Patient Measurements: Height: 5\' 11"  (180.3 cm) Weight: 159 lb 2.8 oz (72.2 kg) IBW/kg (Calculated) : 75.3 Heparin Dosing Weight:   Vital Signs: Temp: 98.1 F (36.7 C) (05/06 0000) Temp Source: Oral (05/06 0000) BP: 162/139 (05/06 0300) Pulse Rate: 53 (05/06 0300)  Labs: Recent Labs    12/24/18 2233 12/24/18 2237 12/25/18 0641 12/25/18 1712 12/25/18 2359  HGB 12.9*  --  13.4  --   --   HCT 41.1  --  42.8  --   --   PLT 189  --  174  --   --   APTT  --   --   --  29  --   LABPROT  --   --   --  18.6*  --   INR  --   --   --  1.6*  --   HEPARINUNFRC  --   --   --   --  0.59  CREATININE 1.15  --  1.28*  --  1.26*  TROPONINI  --  0.08*  --   --   --     Estimated Creatinine Clearance: 76.4 mL/min (A) (by C-G formula based on SCr of 1.26 mg/dL (H)).   Medications:  Infusions:  . heparin 1,300 Units/hr (12/26/18 0300)    Assessment: Patient with heparin level at goal.  No heparin issues noted.   Goal of Therapy:  Heparin level 0.3-0.7 units/ml Monitor platelets by anticoagulation protocol:  Yes   Plan:  Continue heparin drip at current rate Recheck level at 0800  Darlina Guys, Jacquenette Shone Crowford 12/26/2018,3:52 AM

## 2018-12-26 NOTE — Progress Notes (Signed)
Entered in error

## 2018-12-26 NOTE — Progress Notes (Addendum)
ANTICOAGULATION CONSULT NOTE - Initial Consult  Pharmacy Consult for warfarin and heparin bridge Indication: LV thrombus  Allergies  Allergen Reactions  . Hydrocodone Hives  . Lisinopril Swelling and Other (See Comments)    Facial swelling/angioedema  . Prednisone Shortness Of Breath, Nausea Only, Swelling and Other (See Comments)    Also made chest feel tight and genital area, legs, and face became swollen badly  . Penicillins Hives and Swelling     Has patient had a PCN reaction causing immediate rash, facial/tongue/throat swelling, SOB or lightheadedness with hypotension: Yes Has patient had a PCN reaction causing severe rash involving mucus membranes or skin necrosis: No Has patient had a PCN reaction that required hospitalization: No Has patient had a PCN reaction occurring within the last 10 years: No If all of the above answers are "NO", then may proceed with Cephalosporin use.     Patient Measurements: Height: 5\' 11"  (180.3 cm) Weight: 164 lb 3.9 oz (74.5 kg) IBW/kg (Calculated) : 75.3 Heparin Dosing Weight = TBW = 72 kg  Vital Signs: Temp: 97.9 F (36.6 C) (05/06 0800) Temp Source: Oral (05/06 0800) BP: 120/67 (05/06 0800) Pulse Rate: 99 (05/06 0800)  Labs: Recent Labs    12/24/18 2233 12/24/18 2237 12/25/18 0641 12/25/18 1712 12/25/18 2359 12/26/18 0417 12/26/18 0924  HGB 12.9*  --  13.4  --   --  12.3*  --   HCT 41.1  --  42.8  --   --  37.6*  --   PLT 189  --  174  --   --  172  --   APTT  --   --   --  29  --   --   --   LABPROT  --   --   --  18.6*  --  21.9*  --   INR  --   --   --  1.6*  --  1.9*  --   HEPARINUNFRC  --   --   --   --  0.59  --  0.80*  CREATININE 1.15  --  1.28*  --  1.26* 1.26*  --   TROPONINI  --  0.08*  --   --   --  0.07* 0.06*    Estimated Creatinine Clearance: 78.8 mL/min (A) (by C-G formula based on SCr of 1.26 mg/dL (H)).   Medical History: Past Medical History:  Diagnosis Date  . Asthma   . Chronic systolic CHF  (congestive heart failure) (HCC)   . Cigarette smoker   . CKD (chronic kidney disease), stage II    [name] 10/01/2017  . History of echocardiogram    a. Echo 5/17 - EF 20-25%, severe diff HK, restrictive physiology, mild to mod MR, severe reduced RVSF, mod RVE, mild RAE, mod TR, PASP 48 mmHg  . Hx of cardiac cath    a. LHC 5/17 - normal coronary arteries. PA 45/25, mean 33, PCWP mean 18  . NICM (nonischemic cardiomyopathy) (HCC)   . Stroke (HCC) 09/27/2017   "was weak on my left side; I'm fully recovered" (11/09/2017)  . Substance abuse (HCC)    cocaine, marijuana   Assessment: Patient is a 43 year old male admitted with SOB. PMH significant for HF, CVA in 2019, asthma, and substance abuse. Pt has history of medication non-compliance. Cardiology following. Diagnosed with LV thrombus and being started on warfarin with heparin bridge. Pt was not on anticoagulant prior to admission.  Baseline labs: -Hgb 13.4 -Plt 174 -INR: 1.6,  elevated at baseline -aPTT: 29  Today, 12/26/18  Hgb 12.3 - slightly decreased  Plt 174 - WNL, stable  INR 1.9 increased, close to therapeutic range.  HL = 0.80 is slightly elevated on heparin infusion of 1300 units/hr.   Confirmed with RN that heparin running at correct rate, no issues with infusion, and no signs/symptoms of bleeding or bruising.   No significant DDI with warfarin. Of note, patient is on ASA 81 mg PO daily.  Goal of Therapy:  HL 0.3-0.7 INR 2-3 Monitor platelets by anticoagulation protocol: Yes   Plan:   Warfarin 2.5 mg PO once this evening   Reduce heparin infusion to 1200 units/hr  Check 6 hour HL  HL, INR, and CBC daily  Monitor for signs/symptoms of bleeding   Patient will need anticoagulation bridge for at least the first 5 days of warfarin therapy and until two days of consecutive INR readings >2.  Cindi Carbon, PharmD 12/26/2018,9:59 AM   Addendum - afternoon update:   Assessment:  HL = 0.60 is therapeutic on  heparin infusion of 1200 units/hr  Confirmed with RN that heparin infusing at correct rate. No issues with infusion and no signs/symptoms of bleeding.  Plan:  Continue heparin level at current infusion rate of 1200 units/hr  Check confirmatory HL in 6 hours  Cindi Carbon, PharmD 12/26/18 4:52 PM

## 2018-12-26 NOTE — Progress Notes (Addendum)
PROGRESS NOTE    Mark [name]  QIO:[id number] DOB: [birth year] DOA: 12/24/2018 PCP: Massie Maroon, FNP  Brief Narrative:  HPI per Dr. Darreld Mclean on 12/25/2018 Mark [name] is a 43 y.o. male with medical history significant for chronic systolic CHF (EF 32-44% with severe diffuse hypokinesis by TTE 09/28/2017), NICM by cardiac cath 2017, history of right MCA CVA 2019, asthma, and substance use who presents to the ED for progressive shortness of breath.  Patient states he is running low on his home Lasix which he has been cutting in half in order to extend the duration of his medications.  He ran out completely about 4 days ago.  He has had progressive swelling of both of his legs as well as shortness of breath and orthopnea.  He denies any chest pain, cough, fever, dysuria.  He for smoking with 1 pack of cigarettes lasting about 3-4 days.  He denies a significant alcohol use.  He admits to occasional cocaine use and says last use was about 2 weeks ago.  ED Course:  Initial vitals showed BP 114/94, pulse 110, RR 25, temp 97.6 Fahrenheit, SPO2 100% on nonrebreather.  Labs notable for WBC 6.7, hemoglobin 12.9, platelets 189, potassium 4.3, BUN 31, creatinine 1.15, troponin I 0.08, BNP 3973.6, digoxin level 0.5, SARS-CoV-2 testing negative.  Portable chest x-ray showed enlarged cardiac silhouette without focal consolidation, pleural effusion or interstitial edema.  Patient was given IV Lasix 40 mg x 1 and placed on BiPAP with improvement in respiratory status.  The hospital service was consulted to admit for further evaluation and management.  **Interim History The patient was weaned off of BiPAP and cardiology consulted and patient is currently being diuresed.  Patient is also been started on a heparin drip with a warfarin bridge for his left ventricular thrombus.  Per cardiology he is not a candidate for ICD given his noncompliance and polysubstance abuse and they are worried about him  being on Coumadin given his noncompliance will continue at this time and per them is not a candidate for advanced therapies but may be if he remains off substances.  Assessment & Plan:   Principal Problem:   Acute on chronic systolic (congestive) heart failure (HCC) Active Problems:   Asthma   Elevated troponin I level   History of right MCA stroke   Demand ischemia (HCC)   LV (left ventricular) mural thrombus without MI  Acute Respiratory Failure with Hypoxia in the setting of acute CHF exacerbation requiring noninvasive positive pressure ventilation with BiPAP -Improved -Patient was placed on BiPAP initially and now been transitioned off -Currently saturating on room air; SARS-CoV-2 Negative  -Repeat CXR in the a.m. and continue diuresis per cardiology as below  Acute on Chronic Combined Systolic and Diastolic CHF exacerbation with recent echo 5 to 10% and severe right ventricular systolic dysfunction and Grade 2 DD -EF 10-15% by echocardiogram February 2019, NICM by cardiac cath 2017.   -BNP on Admission was 3,973.6  -Patient has significant volume overload on exam with improvement in respiratory symptoms on BiPAP. -Admit to stepdown unit for as needed BiPAP and now of off of it -IV Lasix 40 mg twice daily changed to q6h by Cardiology and will continue for now  -Resume home Losartan 25 mg po qHS, Spironolactone 25 mg po Daily, and Digoxin 0.125 mg po Daily -Strict I's/O's and Daily Weights; Patient is +5 lbs but ? Accuracy; Baseline Weight is 147 lbs per Cardiology  -He is -609 mL since  admission -Will place on Heart Healthy Diet and Fluid Restrict to 1500 mL -Repeat Echocardiogram this visit showed moderate LV dilatation and severe systolic dysfunction with LVEF of 5 to 10% and diffuse hypokinesis.  There is also new large apical thrombus measuring 2.7 x 1.6 cm and grade 2 diastolic dysfunction with severely elevated filling pressures -Cardiology giving Metolazone 2.5 mg po x1 today  -Dr. Delton See currently not a candidate for advanced therapies and no indication for ICD now as he has Significant Substance Abuse  Elevated Troponin Level -Mild troponin elevation of 0.08, likely due to demand ischemia.  -He has chronically elevated troponin on review of labs.   -We will continue to trend and trending down as went from 0.08 -> 0.07 -> 0.06 -Continue to Monitor for Chest Pain   History of Right MCA Stroke -No residual deficit. - Had an MRI of Brain on 09/28/2017 which showed moderate to large right MCA distribution infarct. There was also associated slow/absent flow within the associated right M2 branch -Suspected to be cardioembolic, however he was not started on anticoagulation due to his history of nonadherence to medication. -He has been on Aspirin 81 mg daily.  He was on statin in the past but does not appear to be taking it. -Continue Aspirin 81 mg Daily -Start Atorvastatin 40 mg Daily -Per Cardiology Ideally needs to be on Warfarin but not compliant enough per them   Asthma -Not obviously wheezing on examination.   -Respiratory symptoms more consistent with CHF exacerbation. -Continue Dulera 2 puff IH BID and Albuterol 2.5 mg Neb q4hprn Wheezing and SOB  Substance Use Disorder/Tobacco use: -Patient acknowledges he needs to quit tobacco. Smoking Cessation Counseling given again  -He admits to cocaine use with last use about 2 weeks ago. -C/w Nicotine Patch 14 mg TD q24h -Obtained UDS and was + for Cocaine   Lactic Acidosis -Improved. Lactic Acid Level went from 2.1 -> 1.4 -Will not continue to Trend   Hyponatremia -Patient's Na+ went from 136 -> 133 -In the Setting of Hypervolemic Hyponatremia -C/w Diuresis per Cardiology -Repeat CMP in AM  Leukocytosis -WBC went from 6.7 -> 9.0 -> 21.7 -? Reactive and in the setting of Diuresis -Patient is Currently Afebrile with a Tmax of 99.1 in the last 24 hours -Continue to Monitor for S/Sx of Infection -Repeat  CBC in AM   Normocytic Anemia  -Patient's hemoglobin/hematocrit went from 12.9/41.1 to 13.4/42.8 and is now 12.8/37.6 -Continue monitor for signs and symptoms of bleeding as patient not anticoagulated with heparin drip as well as Coumadin -Check anemia panel in a.m. -Repeat CBC in a.m.  Hypophosphatemia -Patient's phosphorus levels morning was 2.4 -Replete with p.o. K-Phos Neutral 500 mg x 1 -Continue to monitor replete as necessary  Renal Insuffiencey  -Patient's BUN/creatinine went from 31/1.15 is now 37/1.26 -In the setting of diuresis -Continue monitor and trend renal function -Avoid nephrotoxic medications if possible as well as contrast dyes and hypotension -Repeat CMP in AM   Left Ventricle Thrombus -Patient is now on Warfarin with Heparin gtt -INR was 1.9 this AM; Pharmacy adjusting -Per Pharmacy: "Patient will need anticoagulation bridge for at least the first 5 days of warfarin therapy and until two days of consecutive INR readings >2." -Cardiology concerned about his noncompliance  -Will need close outpatient Monitoring if this is continued and if patient becomes more compliant   Hyperglycemia in the setting of Pre-Diabetes -Patient's Blood Sugar has been ranging from 97-123 -Check HbA1c; Last HbA1c was 6.4 -Continue to  Monitor Blood Sugars Carefully -IF necessary will place on Sensitive Novolog SSI  Hyperbilirubinemia -Likely Reactive -T Bili was 2.4 on Admission and is now 2.5 -Continue to Monitor and Trend -Repeat CMP in AM   Elevated AST -In the setting of Volume Overload -Trending Down as went from 55 -> 44 -Continue to Monitor and Trend Closely  -Repeat CMP in AM   DVT prophylaxis: Anticoagulated with Heparin gtt and Coumadin Code Status: FULL CODE Family Communication: No family present at bedside  Disposition Plan: Remain Inpatient as currently getting diuresed; Will need to be diuresed back to dry weight  Consultants:   Cardiology Dr. Delton SeeNelson    Procedures:  ECHOCARDIOGRAM IMPRESSIONS    1. The right ventricle has severely reduced systolic function. The cavity was severely enlarged. There is no increase in right ventricular wall thickness.  2. Left atrial size was mildly dilated.  3. Right atrial size was moderately dilated.  4. Severely thickened tricuspid valve leaflets.  5. The mitral valve is grossly normal. Mitral valve regurgitation is moderate to severe by color flow Doppler. The MR jet is posteriorly-directed.  6. The tricuspid valve is grossly normal.  7. The pulmonic valve was grossly normal. Pulmonic valve regurgitation is moderate is mild by color flow Doppler.  SUMMARY   Moderate LV dilatation and severe systolic dysfunction with LVEF 5-10% and diffuse hypokinesis. 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 Grade 2 diastolic dysfunction with severely elevated filling pressures.   Right ventricle is severely dilated and now with severe systolic dysfunction (previously mild to moderate). RVSP is 42 mmHg consistent with moderate pulmonary hypertension, however underestimated in teh settings of severe RV dysfunction. RVSP previously 56 mmHg.   Moderate to severe mitral and severe tricuspid regurgitation.  FINDINGS  Left Ventricle: The left ventricle has severely reduced systolic function, wih an ejection fraction of 5-10%. The cavity size was normal. There is no increase in left ventricular wall thickness. Definity contrast agent was given IV to delineate the left  ventricular endocardial borders.  Right Ventricle: The right ventricle has severely reduced systolic function. The cavity was severely enlarged. There is no increase in right ventricular wall thickness.  Left Atrium: Left atrial size was mildly dilated.  Right Atrium: Right atrial size was moderately dilated. Right atrial pressure is estimated at 15 mmHg.  Interatrial Septum: No atrial level shunt detected by color flow  Doppler.  Pericardium: There is no evidence of pericardial effusion.  Mitral Valve: The mitral valve is grossly normal. Mitral valve regurgitation is moderate to severe by color flow Doppler. The MR jet is posteriorly-directed.  Tricuspid Valve: The tricuspid valve is grossly normal. Tricuspid valve regurgitation was not visualized by color flow Doppler. The tricuspid valve is severely thickened.  Aortic Valve: The aortic valve is normal in structure. Aortic valve regurgitation was not assessed by color flow Doppler.  Pulmonic Valve: The pulmonic valve was grossly normal. Pulmonic valve regurgitation is moderate is mild by color flow Doppler.  Venous: The inferior vena cava is normal in size with greater than 50% respiratory variability.    +--------------+--------++ LEFT VENTRICLE              +----------------+---------++ +--------------+--------++      Diastology                PLAX 2D                     +----------------+---------++ +--------------+--------++  LV e' lateral:  8.03 cm/s LVIDd:        5.59 cm       +----------------+---------++ +--------------+--------++      LV E/e' lateral:12.3      LVIDs:        5.32 cm       +----------------+---------++ +--------------+--------++      LV e' medial:   3.75 cm/s LV PW:        0.96 cm       +----------------+---------++ +--------------+--------++      LV E/e' medial: 26.4      LV IVS:       0.90 cm       +----------------+---------++ +--------------+--------++ LVOT diam:    2.20 cm  +--------------+--------++ LV SV:        17 ml    +--------------+--------++ LV SV Index:  8.69     +--------------+--------++ LVOT Area:    3.80 cm +--------------+--------++                        +--------------+--------++   +------------------+---------++ LV Volumes (MOD)            +------------------+---------++ LV area d, A2C:   59.90 cm  +------------------+---------++ LV area d, A4C:   53.00 cm +------------------+---------++ LV area s, A2C:   51.80 cm +------------------+---------++ LV area s, A4C:   47.70 cm +------------------+---------++ LV major d, A2C:  10.80 cm  +------------------+---------++ LV major d, A4C:  10.40 cm  +------------------+---------++ LV major s, A2C:  9.93 cm   +------------------+---------++ LV major s, A4C:  9.73 cm   +------------------+---------++ LV vol d, MOD A2C:275.0 ml  +------------------+---------++ LV vol d, MOD A4C:222.0 ml  +------------------+---------++ LV vol s, MOD A2C:230.0 ml  +------------------+---------++ LV vol s, MOD A4C:194.0 ml  +------------------+---------++ LV SV MOD A2C:    45.0 ml   +------------------+---------++ LV SV MOD A4C:    222.0 ml  +------------------+---------++ LV SV MOD BP:     39.2 ml   +------------------+---------++  +---------------+---------++ RIGHT VENTRICLE          +---------------+---------++ RV S prime:    8.93 cm/s +---------------+---------++ TAPSE (M-mode):1.3 cm    +---------------+---------++  +-----------+-------++----------++ LEFT ATRIUM       Index      +-----------+-------++----------++ LA diam:   4.20 cm2.19 cm/m +-----------+-------++----------++  +------------+-----------++ AORTIC VALVE            +------------+-----------++ LVOT Vmax:  45.00 cm/s  +------------+-----------++ LVOT Vmean: 27.100 cm/s +------------+-----------++ LVOT VTI:   0.048 m     +------------+-----------++   +-------------+-------++ AORTA                +-------------+-------++ Ao Root diam:3.00 cm +-------------+-------++ Ao Asc diam: 2.70 cm +-------------+-------++  +--------------+----------++ +---------------+-----------++ MITRAL VALVE             TRICUSPID VALVE             +--------------+----------++ +---------------+-----------++ MV Area (PHT):5.38 cm   TR Peak grad:  26.0 mmHg   +--------------+----------++ +---------------+-----------++ MV PHT:       40.89 msec TR Vmax:       255.00 cm/s +--------------+----------++ +---------------+-----------++ MV Decel Time:141 msec   +--------------+----------++ +--------------+-------+ +--------------+----------++ SHUNTS                MV E velocity:99.00 cm/s +--------------+-------+ +--------------+----------++ Systemic VTI: 0.05 m  MV A velocity:46.30 cm/s +--------------+-------+ +--------------+----------++ Systemic Diam:2.20 cm MV E/A ratio: 2.14       +--------------+-------+ +--------------+----------++  Antimicrobials: Anti-infectives (From admission, onward)   None     Subjective: Seen and examined at bedside and states that his shortness of breath was better.  Still remains extremely volume overloaded.  Denies any abdominal pain.  Wanted to be left alone and eat his oatmeal.  No other concerns or complaints at this time.  Objective: Vitals:   12/26/18 0400 12/26/18 0415 12/26/18 0500 12/26/18 0600  BP:  108/76 107/85 109/86  Pulse: (!) 107 (!) 107 (!) 103 (!) 102  Resp: (!) 26 15 (!) 25 (!) 22  Temp: 99.1 F (37.3 C)     TempSrc: Oral     SpO2: 97% 98% 96% 96%  Weight:      Height:        Intake/Output Summary (Last 24 hours) at 12/26/2018 0739 Last data filed at 12/26/2018 0600 Gross per 24 hour  Intake 1372.64 ml  Output 1750 ml  Net -377.36 ml   Filed Weights   12/25/18 0313 12/26/18 0357  Weight: 72.2 kg 74.5 kg   Examination: Physical Exam:  Constitutional: WN/WD AAM NAD and appears a little agitated Eyes: Lids and conjunctivae normal, sclerae anicteric  ENMT: External Ears, Nose appear normal. Grossly normal hearing. Mucous membranes are moist.   Neck: Appears normal, supple, no cervical masses, normal ROM, no appreciable  thyromegaly; Has JVD Respiratory: Diminished to auscultation bilaterally with coarse breath sounds and has some crackles but no wheezing, rales, rhonchi. Normal respiratory effort and patient is not tachypenic. No accessory muscle use.  Cardiovascular: RRR, Has a 3/6 Systolic Murmur; 1-6+ LE pitting edema  Abdomen: Soft, non-tender, non-distended. No masses palpated. No appreciable hepatosplenomegaly. Bowel sounds positive x4.  GU: Deferred. Musculoskeletal: No clubbing / cyanosis of digits/nails. No joint deformity upper and lower extremities. Skin: No rashes, lesions, ulcers on a limited skin evaluation. No induration; Warm and dry.  Neurologic: CN 2-12 grossly intact with no focal deficits. Romberg sign and cerebellar reflexes not assessed.  Psychiatric: Normal judgment and insight. Alert and oriented x 3. Normal mood and appropriate affect.   Data Reviewed: I have personally reviewed following labs and imaging studies  CBC: Recent Labs  Lab 12/24/18 2233 12/25/18 0641 12/26/18 0417  WBC 6.7 9.0 21.7*  HGB 12.9* 13.4 12.3*  HCT 41.1 42.8 37.6*  MCV 83.0 81.5 80.5  PLT 189 174 172   Basic Metabolic Panel: Recent Labs  Lab 12/24/18 2233 12/25/18 0641 12/25/18 2359  NA 135 136 133*  K 4.3 4.9 3.9  CL 98 98 95*  CO2 GLUCOSE 97 112* 108*  BUN 31* 32* 39*  CREATININE 1.15 1.28* 1.26*  CALCIUM 8.9 9.1 8.6*  MG  --  1.9  --    GFR: Estimated Creatinine Clearance: 78.8 mL/min (A) (by C-G formula based on SCr of 1.26 mg/dL (H)). Liver Function Tests: Recent Labs  Lab 12/24/18 2237  AST 55*  ALT 31  ALKPHOS 112  BILITOT 2.4*  PROT 7.7  ALBUMIN 3.3*   No results for input(s): LIPASE, AMYLASE in the last 168 hours. No results for input(s): AMMONIA in the last 168 hours. Coagulation Profile: Recent Labs  Lab 12/25/18 1712 12/26/18 0417  INR 1.6* 1.9*   Cardiac Enzymes: Recent Labs  Lab 12/24/18 2237 12/26/18 0417  TROPONINI 0.08* 0.07*   BNP (last 3  results) No results for input(s): PROBNP in the last 8760 hours. HbA1C: No results for input(s): HGBA1C in the last 72 hours. CBG: No results for input(s): GLUCAP in  the last 168 hours. Lipid Profile: No results for input(s): CHOL, HDL, LDLCALC, TRIG, CHOLHDL, LDLDIRECT in the last 72 hours. Thyroid Function Tests: No results for input(s): TSH, T4TOTAL, FREET4, T3FREE, THYROIDAB in the last 72 hours. Anemia Panel: No results for input(s): VITAMINB12, FOLATE, FERRITIN, TIBC, IRON, RETICCTPCT in the last 72 hours. Sepsis Labs: Recent Labs  Lab 12/25/18 2359 12/26/18 0417  LATICACIDVEN 2.1* 1.4    Recent Results (from the past 240 hour(s))  SARS Coronavirus 2 (CEPHEID - Performed in [facility] hospital lab), Hosp Order     Status: None   Collection Time: 12/24/18 10:54 PM  Result Value Ref Range Status   SARS Coronavirus 2 NEGATIVE NEGATIVE Final    Comment: (NOTE) If result is NEGATIVE SARS-CoV-2 target nucleic acids are NOT DETECTED. The SARS-CoV-2 RNA is generally detectable in upper and lower  respiratory specimens during the acute phase of infection. The lowest  concentration of SARS-CoV-2 viral copies this assay can detect is 250  copies / mL. A negative result does not preclude SARS-CoV-2 infection  and should not be used as the sole basis for treatment or other  patient management decisions.  A negative result may occur with  improper specimen collection / handling, submission of specimen other  than nasopharyngeal swab, presence of viral mutation(s) within the  areas targeted by this assay, and inadequate number of viral copies  (<250 copies / mL). A negative result must be combined with clinical  observations, patient history, and epidemiological information. If result is POSITIVE SARS-CoV-2 target nucleic acids are DETECTED. The SARS-CoV-2 RNA is generally detectable in upper and lower  respiratory specimens dur ing the acute phase of infection.  Positive  results  are indicative of active infection with SARS-CoV-2.  Clinical  correlation with patient history and other diagnostic information is  necessary to determine patient infection status.  Positive results do  not rule out bacterial infection or co-infection with other viruses. If result is PRESUMPTIVE POSTIVE SARS-CoV-2 nucleic acids MAY BE PRESENT.   A presumptive positive result was obtained on the submitted specimen  and confirmed on repeat testing.  While 2019 novel coronavirus  (SARS-CoV-2) nucleic acids may be present in the submitted sample  additional confirmatory testing may be necessary for epidemiological  and / or clinical management purposes  to differentiate between  SARS-CoV-2 and other Sarbecovirus currently known to infect humans.  If clinically indicated additional testing with an alternate test  methodology [phone]) is advised. The SARS-CoV-2 RNA is generally  detectable in upper and lower respiratory sp ecimens during the acute  phase of infection. The expected result is Negative. Fact Sheet for Patients:  BoilerBrush.com.cy Fact Sheet for Healthcare Providers: https://pope.com/ This test is not yet approved or cleared by the [place] FDA and has been authorized for detection and/or diagnosis of SARS-CoV-2 by FDA under an Emergency Use Authorization (EUA).  This EUA will remain in effect (meaning this test can be used) for the duration of the COVID-19 declaration under Section 564(b)(1) of the Act, 21 U.S.C. section 360bbb-3(b)(1), unless the authorization is terminated or revoked sooner. Performed at [facility], [address] [place], [place] [address]      Radiology Studies: Dg Chest Port 1 View  Result Date: 12/24/2018 CLINICAL DATA:  CHF EXAM: PORTABLE CHEST 1 VIEW COMPARISON:  11/29/2015 FINDINGS: Cardiomegaly. No confluent airspace opacities, effusions or edema. No acute bony  abnormality. IMPRESSION: Cardiomegaly.  No active disease. Electronically Signed   By: Charlett Nose  M.D.   On: 12/24/2018 22:58   Scheduled Meds: . aspirin EC  81 mg Oral Daily  . atorvastatin  40 mg Oral q1800  . Chlorhexidine Gluconate Cloth  6 each Topical Daily  . digoxin  0.125 mg Oral Daily  . furosemide  40 mg Intravenous Q6H  . losartan  25 mg Oral QHS  . mometasone-formoterol  2 puff Inhalation BID  . nicotine  14 mg Transdermal Daily  . spironolactone  25 mg Oral Daily  . Warfarin - Pharmacist Dosing Inpatient   Does not apply q1800   Continuous Infusions: . heparin 1,300 Units/hr (12/26/18 0600)    LOS: 1 day   Merlene Laughter, DO [facility] PAGER is on AMION  If 7PM-7AM, please contact night-coverage [email or web address] Password [facility] 12/26/2018, 7:39 AM

## 2018-12-27 ENCOUNTER — Inpatient Hospital Stay (HOSPITAL_COMMUNITY): Payer: Medicaid Other

## 2018-12-27 DIAGNOSIS — K047 Periapical abscess without sinus: Secondary | ICD-10-CM

## 2018-12-27 DIAGNOSIS — I472 Ventricular tachycardia: Secondary | ICD-10-CM

## 2018-12-27 LAB — CBC WITH DIFFERENTIAL/PLATELET
Abs Immature Granulocytes: 0.09 10*3/uL — ABNORMAL HIGH (ref 0.00–0.07)
Basophils Absolute: 0 10*3/uL (ref 0.0–0.1)
Basophils Relative: 0 %
Eosinophils Absolute: 0 10*3/uL (ref 0.0–0.5)
Eosinophils Relative: 0 %
HCT: 39 % (ref 39.0–52.0)
Hemoglobin: 12.6 g/dL — ABNORMAL LOW (ref 13.0–17.0)
Immature Granulocytes: 1 %
Lymphocytes Relative: 8 %
Lymphs Abs: 1.2 10*3/uL (ref 0.7–4.0)
MCH: 26.2 pg (ref 26.0–34.0)
MCHC: 32.3 g/dL (ref 30.0–36.0)
MCV: 81.1 fL (ref 80.0–100.0)
Monocytes Absolute: 0.7 10*3/uL (ref 0.1–1.0)
Monocytes Relative: 5 %
Neutro Abs: 12.1 10*3/uL — ABNORMAL HIGH (ref 1.7–7.7)
Neutrophils Relative %: 86 %
Platelets: 153 10*3/uL (ref 150–400)
RBC: 4.81 MIL/uL (ref 4.22–5.81)
RDW: 18.5 % — ABNORMAL HIGH (ref 11.5–15.5)
WBC: 14.1 10*3/uL — ABNORMAL HIGH (ref 4.0–10.5)
nRBC: 0.1 % (ref 0.0–0.2)

## 2018-12-27 LAB — COMPREHENSIVE METABOLIC PANEL
ALT: 25 U/L (ref 0–44)
AST: 37 U/L (ref 15–41)
Albumin: 2.7 g/dL — ABNORMAL LOW (ref 3.5–5.0)
Alkaline Phosphatase: 91 U/L (ref 38–126)
Anion gap: 11 (ref 5–15)
BUN: 29 mg/dL — ABNORMAL HIGH (ref 6–20)
CO2: 31 mmol/L (ref 22–32)
Calcium: 8.6 mg/dL — ABNORMAL LOW (ref 8.9–10.3)
Chloride: 89 mmol/L — ABNORMAL LOW (ref 98–111)
Creatinine, Ser: 1.15 mg/dL (ref 0.61–1.24)
GFR calc Af Amer: >60 mL/min (ref >60)
GFR calc non Af Amer: >60 mL/min (ref >60)
Glucose, Bld: 124 mg/dL — ABNORMAL HIGH (ref 70–99)
Potassium: 3.6 mmol/L (ref 3.5–5.1)
Sodium: 131 mmol/L — ABNORMAL LOW (ref 135–145)
Total Bilirubin: 2.2 mg/dL — ABNORMAL HIGH (ref 0.3–1.2)
Total Protein: 6.8 g/dL (ref 6.5–8.1)

## 2018-12-27 LAB — PROTIME-INR
INR: 2.3 — ABNORMAL HIGH (ref 0.8–1.2)
Prothrombin Time: 25 seconds — ABNORMAL HIGH (ref 11.4–15.2)

## 2018-12-27 LAB — HEMOGLOBIN A1C
Hgb A1c MFr Bld: 6.8 % — ABNORMAL HIGH (ref 4.8–5.6)
Mean Plasma Glucose: 148.46 mg/dL

## 2018-12-27 LAB — RETICULOCYTES
Immature Retic Fract: 27.4 % — ABNORMAL HIGH (ref 2.3–15.9)
RBC.: 4.81 MIL/uL (ref 4.22–5.81)
Retic Count, Absolute: 89.5 10*3/uL (ref 19.0–186.0)
Retic Ct Pct: 1.9 % (ref 0.4–3.1)

## 2018-12-27 LAB — IRON AND TIBC
Iron: 14 ug/dL — ABNORMAL LOW (ref 45–182)
Saturation Ratios: 5 % — ABNORMAL LOW (ref 17.9–39.5)
TIBC: 301 ug/dL (ref 250–450)
UIBC: 287 ug/dL

## 2018-12-27 LAB — HEPARIN LEVEL (UNFRACTIONATED): Heparin Unfractionated: 0.61 IU/mL (ref 0.30–0.70)

## 2018-12-27 LAB — FERRITIN: Ferritin: 131 ng/mL (ref 24–336)

## 2018-12-27 LAB — MAGNESIUM: Magnesium: 1.5 mg/dL — ABNORMAL LOW (ref 1.7–2.4)

## 2018-12-27 LAB — PHOSPHORUS: Phosphorus: 2.3 mg/dL — ABNORMAL LOW (ref 2.5–4.6)

## 2018-12-27 LAB — FOLATE: Folate: 7.3 ng/mL (ref >5.9)

## 2018-12-27 LAB — VITAMIN B12: Vitamin B-12: 718 pg/mL (ref 180–914)

## 2018-12-27 MED ORDER — DIPHENHYDRAMINE HCL 50 MG/ML IJ SOLN
INTRAMUSCULAR | Status: AC
  Filled 2018-12-27: qty 1

## 2018-12-27 MED ORDER — METOLAZONE 2.5 MG PO TABS
2.5000 mg | ORAL_TABLET | Freq: Once | ORAL | Status: AC
  Administered 2018-12-27: 2.5 mg via ORAL
  Filled 2018-12-27: qty 1

## 2018-12-27 MED ORDER — K PHOS MONO-SOD PHOS DI & MONO 155-852-130 MG PO TABS
500.0000 mg | ORAL_TABLET | Freq: Once | ORAL | Status: AC
  Administered 2018-12-27: 500 mg via ORAL
  Filled 2018-12-27: qty 2

## 2018-12-27 MED ORDER — OXYCODONE-ACETAMINOPHEN 5-325 MG PO TABS
2.0000 | ORAL_TABLET | Freq: Once | ORAL | Status: AC
  Administered 2018-12-27: 2 via ORAL
  Filled 2018-12-27: qty 2

## 2018-12-27 MED ORDER — WARFARIN SODIUM 1 MG PO TABS
1.0000 mg | ORAL_TABLET | Freq: Once | ORAL | Status: AC
  Administered 2018-12-27: 1 mg via ORAL
  Filled 2018-12-27 (×2): qty 1

## 2018-12-27 MED ORDER — DIPHENHYDRAMINE HCL 50 MG/ML IJ SOLN
25.0000 mg | Freq: Four times a day (QID) | INTRAMUSCULAR | Status: DC | PRN
  Administered 2018-12-27: 25 mg via INTRAVENOUS
  Filled 2018-12-27: qty 1

## 2018-12-27 MED ORDER — MAGNESIUM SULFATE 2 GM/50ML IV SOLN
2.0000 g | Freq: Once | INTRAVENOUS | Status: AC
  Administered 2018-12-27: 2 g via INTRAVENOUS
  Filled 2018-12-27: qty 50

## 2018-12-27 NOTE — Progress Notes (Signed)
PROGRESS NOTE    Mark [name]  IDC:[id number] DOB: [birth year] DOA: 12/24/2018 PCP: Massie Maroon, FNP  Brief Narrative:  HPI per Dr. Darreld Mclean on 12/25/2018 Mark [name] is a 43 y.o. male with medical history significant for chronic systolic CHF (EF 87-57% with severe diffuse hypokinesis by TTE 09/28/2017), NICM by cardiac cath 2017, history of right MCA CVA 2019, asthma, and substance use who presents to the ED for progressive shortness of breath.  Patient states he is running low on his home Lasix which he has been cutting in half in order to extend the duration of his medications.  He ran out completely about 4 days ago.  He has had progressive swelling of both of his legs as well as shortness of breath and orthopnea.  He denies any chest pain, cough, fever, dysuria.  He for smoking with 1 pack of cigarettes lasting about 3-4 days.  He denies a significant alcohol use.  He admits to occasional cocaine use and says last use was about 2 weeks ago.  ED Course:  Initial vitals showed BP 114/94, pulse 110, RR 25, temp 97.6 Fahrenheit, SPO2 100% on nonrebreather.  Labs notable for WBC 6.7, hemoglobin 12.9, platelets 189, potassium 4.3, BUN 31, creatinine 1.15, troponin I 0.08, BNP 3973.6, digoxin level 0.5, SARS-CoV-2 testing negative.  Portable chest x-ray showed enlarged cardiac silhouette without focal consolidation, pleural effusion or interstitial edema.  Patient was given IV Lasix 40 mg x 1 and placed on BiPAP with improvement in respiratory status.  The hospital service was consulted to admit for further evaluation and management.  **Interim History The patient was weaned off of BiPAP and cardiology consulted and patient is currently being diuresed.  Patient is also been started on a heparin drip with a warfarin bridge for his left ventricular thrombus.  Per cardiology he is not a candidate for ICD given his noncompliance and polysubstance abuse and they are worried about him  being on Coumadin given his noncompliance will continue at this time and per them is not a candidate for advanced therapies but may be if he remains off substances.  Assessment & Plan:   Principal Problem:   Acute on chronic systolic (congestive) heart failure (HCC) Active Problems:   Asthma   Elevated troponin I level   History of right MCA stroke   Demand ischemia (HCC)   LV (left ventricular) mural thrombus without MI  Acute Respiratory Failure with Hypoxia in the setting of acute CHF exacerbation requiring noninvasive positive pressure ventilation with BiPAP -Improved -Patient was placed on BiPAP initially and now been transitioned off -Currently saturating on room air; SARS-CoV-2 Negative  -Repeat CXR this AM showed "Stable mild cardiomegaly. Normal mediastinal contours. Normal pulmonary vascularity. No focal consolidation, pleural effusion, or pneumothorax. No acute osseous abnormality." -Continue diuresis per cardiology as below  Acute on Chronic Combined Systolic and Diastolic CHF exacerbation with recent echo 5 to 10% and severe right ventricular systolic dysfunction and Grade 2 DD -EF 10-15% by echocardiogram February 2019, NICM by cardiac cath 2017.   -BNP on Admission was 3,973.6  -Patient has significant volume overload on exam with improvement in respiratory symptoms on BiPAP. -Admit to stepdown unit for as needed BiPAP and now of off of it -IV Lasix 40 mg twice daily changed to q6h by Cardiology and will continue for now  -Resume home Losartan 25 mg po qHS, Spironolactone 25 mg po Daily, and Digoxin 0.125 mg po Daily -Strict I's/O's and Daily Weights; Patient  is -7 lbs since admission Baseline Weight is 147 lbs per Cardiology  -He is -4,983 mL since admission -Will place on Heart Healthy Diet and Fluid Restrict to 1500 mL -Repeat Echocardiogram this visit showed moderate LV dilatation and severe systolic dysfunction with LVEF of 5 to 10% and diffuse hypokinesis.  There is  also new large apical thrombus measuring 2.7 x 1.6 cm and grade 2 diastolic dysfunction with severely elevated filling pressures -Cardiology giving Metolazone 2.5 mg po x1 again today  -Dr. Delton See currently not a candidate for advanced therapies and no indication for ICD now as he has Significant Substance Abuse  Elevated Troponin Level -Mild troponin elevation of 0.08, likely due to demand ischemia.  -He has chronically elevated troponin on review of labs.   -We will continue to trend and trending down as went from 0.08 -> 0.07 -> 0.06 -Continue to Monitor for Chest Pain   History of Right MCA Stroke -No residual deficit. -Had an MRI of Brain on 09/28/2017 which showed moderate to large right MCA distribution infarct. There was also associated slow/absent flow within the associated right M2 branch -Suspected to be cardioembolic, however he was not started on anticoagulation due to his history of nonadherence to medication as an outpateitn  -He has been on Aspirin 81 mg daily.  He was on statin in the past but does not appear to be taking it. -Continue Aspirin 81 mg Daily -Start Atorvastatin 40 mg Daily -Per Cardiology Ideally needs to be on Warfarin but not compliant enough per them but since he was started on admission, will continue it and Cardiology states they will follow patient as an outpatient for his Coumadin **(Could not get in touch with Dr. Delton See today but discussed with the PA who directly spoke to Dr. Delton See)  Asthma -Not obviously wheezing on examination.   -Respiratory symptoms more consistent with CHF exacerbation. -Continue Dulera 2 puff IH BID and Albuterol 2.5 mg Neb q4hprn Wheezing and SOB  Substance Use Disorder/Tobacco use: -Patient acknowledges he needs to quit tobacco. Smoking Cessation Counseling given again  -He admits to cocaine use with last use about 2 weeks ago. -C/w Nicotine Patch 14 mg TD q24h -Obtained UDS and was + for Cocaine   Lactic  Acidosis -Improved. Lactic Acid Level went from 2.1 -> 1.4 -Will not continue to Trend   Hyponatremia -Patient's Na+ went from 136 -> 133 -> 131 -In the Setting of Hypervolemic Hyponatremia -C/w Diuresis per Cardiology -Repeat CMP in AM  Leukocytosis in the Setting of Dental Infection, improving -WBC went from 6.7 -> 9.0 -> 21.7 -> 14.1 -? Reactive and in the setting of Diuresis but now likely source is mouth as patient has swelling and pain in Jaw and specifically left side of face -Patient is Currently Afebrile with a Tmax of 98.5 in the last 24 hours -Wanted to obtain a DG Orthopantogram but no longer available at [facility] -Instead ordered CT Maxillofacial and showed "No acute bony abnormality is noted. Soft tissue swelling is seen with some subcutaneous air consistent with focal inflammatory change. Multiple dental caries are noted. Inflammatory changes in the paranasal sinuses with air-fluid level in the left maxillary antrum consistent with acute sinusitis." -Patient was started on Clindamycin IV given PCN Allergy -Have attempted to reach our In-house Dentist Dr. Kristin Bruins for evaluation but no response after calling and will try again  -Continue to Monitor for S/Sx of Infection -Repeat CBC in AM   Normocytic Anemia  -Patient's hemoglobin/hematocrit went from  12.9/41.1 -> 13.4/42.8 -> 12.8/37.6 -> 12.6/39.0 -Continue monitor for signs and symptoms of bleeding as patient not anticoagulated with heparin drip as well as Coumadin -Checked Anemia Panel and showed iron level 14, U IBC of 287, TIBC of 301, saturation ratios of 5%, ferritin level 131, folate level 7.3, and vitamin B12 of 718 -Repeat CBC in a.m.  Hypophosphatemia -Patient's phosphorus levels morning was 2.3 -Replete with p.o. K-Phos Neutral 500 mg x 1 -Continue to monitor replete as necessary  Hypomagnesemia -Patient's Mag Level was 1.5 this AM -Replete with IV Mag Sulfate 2 grams -Continue to Monitor and Replete as  Necessary -Repeat Mag Level in AM   Renal Insuffiencey, improving  -Patient's BUN/creatinine went from 31/1.15 ->  37/1.26 -> 29/1.15 -In the setting of diuresis -Continue monitor and trend renal function -Avoid nephrotoxic medications if possible as well as contrast dyes and hypotension -Repeat CMP in AM   Left Ventricle Thrombus -Patient is now on Warfarin with Heparin gtt -INR was 2.3 this AM; Pharmacy adjusting -Per Pharmacy: "Patient will need anticoagulation bridge for at least the first 5 days of warfarin therapy and until two days of consecutive INR readings >2" and liklely can D/C Heparin gtt tomorrow  -Cardiology concerned about his noncompliance but since he was started on Heparin gtt and Coumadin on Admission will continue and per discussion with CARDs PA (Could not get in touch with Dr. Delton See after several attempts) she discussed with Dr. Delton See who states to continue Coumadin and Cardiology will follow up at D/C and continue to Monitor INR's  -Will need close outpatient Monitoring if this is continued and if patient becomes more compliant   Hyperglycemia in the setting of New Onset Diabetes -Patient's Blood Sugar has been ranging from 97-123 -Checked HbA1c and was 6.8; Last HbA1c was 6.4 -Continue to Monitor Blood Sugars Carefully -IF necessary will place on Sensitive Novolog SSI  Hyperbilirubinemia -Likely Reactive -T Bili was 2.4 on Admission went to 2.5 and is now 2.2 -Continue to Monitor and Trend -Repeat CMP in AM   Elevated AST -In the setting of Volume Overload -Trending Down as went from 55 -> 44 -> 37 -Continue to Monitor and Trend Closely  -Repeat CMP in AM   DVT prophylaxis: Anticoagulated with Heparin gtt and Coumadin Code Status: FULL CODE Family Communication: No family present at bedside  Disposition Plan: Remain Inpatient as currently getting diuresed; Will need to be diuresed back to dry weight  Consultants:   Cardiology Dr.  Delton See   Procedures:  ECHOCARDIOGRAM IMPRESSIONS    1. The right ventricle has severely reduced systolic function. The cavity was severely enlarged. There is no increase in right ventricular wall thickness.  2. Left atrial size was mildly dilated.  3. Right atrial size was moderately dilated.  4. Severely thickened tricuspid valve leaflets.  5. The mitral valve is grossly normal. Mitral valve regurgitation is moderate to severe by color flow Doppler. The MR jet is posteriorly-directed.  6. The tricuspid valve is grossly normal.  7. The pulmonic valve was grossly normal. Pulmonic valve regurgitation is moderate is mild by color flow Doppler.  SUMMARY   Moderate LV dilatation and severe systolic dysfunction with LVEF 5-10% and diffuse hypokinesis. 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 Grade 2 diastolic dysfunction with severely elevated filling pressures.   Right ventricle is severely dilated and now with severe systolic dysfunction (previously mild to moderate). RVSP is 42 mmHg consistent with moderate pulmonary hypertension, however underestimated  in teh settings of severe RV dysfunction. RVSP previously 56 mmHg.   Moderate to severe mitral and severe tricuspid regurgitation.  FINDINGS  Left Ventricle: The left ventricle has severely reduced systolic function, wih an ejection fraction of 5-10%. The cavity size was normal. There is no increase in left ventricular wall thickness. Definity contrast agent was given IV to delineate the left  ventricular endocardial borders.  Right Ventricle: The right ventricle has severely reduced systolic function. The cavity was severely enlarged. There is no increase in right ventricular wall thickness.  Left Atrium: Left atrial size was mildly dilated.  Right Atrium: Right atrial size was moderately dilated. Right atrial pressure is estimated at 15 mmHg.  Interatrial Septum: No atrial level shunt detected by color  flow Doppler.  Pericardium: There is no evidence of pericardial effusion.  Mitral Valve: The mitral valve is grossly normal. Mitral valve regurgitation is moderate to severe by color flow Doppler. The MR jet is posteriorly-directed.  Tricuspid Valve: The tricuspid valve is grossly normal. Tricuspid valve regurgitation was not visualized by color flow Doppler. The tricuspid valve is severely thickened.  Aortic Valve: The aortic valve is normal in structure. Aortic valve regurgitation was not assessed by color flow Doppler.  Pulmonic Valve: The pulmonic valve was grossly normal. Pulmonic valve regurgitation is moderate is mild by color flow Doppler.  Venous: The inferior vena cava is normal in size with greater than 50% respiratory variability.    +--------------+--------++  LEFT VENTRICLE                 +----------------+---------++ +--------------+--------++       Diastology                    PLAX 2D                        +----------------+---------++ +--------------+--------++       LV e' lateral:   8.03 cm/s    LVIDd:         5.59 cm         +----------------+---------++ +--------------+--------++       LV E/e' lateral: 12.3         LVIDs:         5.32 cm         +----------------+---------++ +--------------+--------++       LV e' medial:    3.75 cm/s    LV PW:         0.96 cm         +----------------+---------++ +--------------+--------++       LV E/e' medial:  26.4         LV IVS:        0.90 cm         +----------------+---------++ +--------------+--------++  LVOT diam:     2.20 cm    +--------------+--------++  LV SV:         17 ml      +--------------+--------++  LV SV Index:   8.69       +--------------+--------++  LVOT Area:     3.80 cm   +--------------+--------++                            +--------------+--------++   +------------------+---------++  LV Volumes (MOD)               +------------------+---------++  LV area d, A2C:    59.90  cm   +------------------+---------++  LV area d, A4C:    53.00 cm   +------------------+---------++  LV area s, A2C:    51.80 cm   +------------------+---------++  LV area s, A4C:    47.70 cm   +------------------+---------++  LV major d, A2C:   10.80 cm    +------------------+---------++  LV major d, A4C:   10.40 cm    +------------------+---------++  LV major s, A2C:   9.93 cm     +------------------+---------++  LV major s, A4C:   9.73 cm     +------------------+---------++  LV vol d, MOD A2C: 275.0 ml    +------------------+---------++  LV vol d, MOD A4C: 222.0 ml    +------------------+---------++  LV vol s, MOD A2C: 230.0 ml    +------------------+---------++  LV vol s, MOD A4C: 194.0 ml    +------------------+---------++  LV SV MOD A2C:     45.0 ml     +------------------+---------++  LV SV MOD A4C:     222.0 ml    +------------------+---------++  LV SV MOD BP:      39.2 ml     +------------------+---------++  +---------------+---------++  RIGHT VENTRICLE             +---------------+---------++  RV S prime:     8.93 cm/s   +---------------+---------++  TAPSE (M-mode): 1.3 cm      +---------------+---------++  +-----------+-------++----------++  LEFT ATRIUM          Index        +-----------+-------++----------++  LA diam:    4.20 cm  2.19 cm/m   +-----------+-------++----------++  +------------+-----------++  AORTIC VALVE               +------------+-----------++  LVOT Vmax:   45.00 cm/s    +------------+-----------++  LVOT Vmean:  27.100 cm/s   +------------+-----------++  LVOT VTI:    0.048 m       +------------+-----------++   +-------------+-------++  AORTA                   +-------------+-------++  Ao Root diam: 3.00 cm   +-------------+-------++  Ao Asc diam:  2.70 cm   +-------------+-------++  +--------------+----------++ +---------------+-----------++  MITRAL VALVE                 TRICUSPID VALVE                +--------------+----------++ +---------------+-----------++  MV Area (PHT): 5.38 cm      TR Peak grad:   26.0 mmHg     +--------------+----------++ +---------------+-----------++  MV PHT:        40.89 msec    TR Vmax:        255.00 cm/s   +--------------+----------++ +---------------+-----------++  MV Decel Time: 141 msec     +--------------+----------++ +--------------+-------+ +--------------+----------++  SHUNTS                   MV E velocity: 99.00 cm/s   +--------------+-------+ +--------------+----------++  Systemic VTI:  0.05 m    MV A velocity: 46.30 cm/s   +--------------+-------+ +--------------+----------++  Systemic Diam: 2.20 cm   MV E/A ratio:  2.14         +--------------+-------+ +--------------+----------++   Antimicrobials: Anti-infectives (From admission, onward)   Start     Dose/Rate Route Frequency Ordered Stop   12/26/18 1800  clindamycin (CLEOCIN) IVPB 600 mg     600 mg 100 mL/hr over 30 Minutes Intravenous Every 8 hours 12/26/18 1709       Subjective: Seen and examined at bedside  and states that his shortness of breath is improved but his mouth is been hurting and was not able to tolerate much of the diet and only eating oatmeal.  No lightheadedness or dizziness.  After his antibiotics were run he started developing itching and sweating and complained of dry mouth so he was given Benadryl.  Discussed with cardiology about continuing Coumadin and heparin drip and they will continue Coumadin at discharge and follow the patient closely as an outpatient.  Patient states he slept okay and had no other concerns or complaints except face pain but still remains swollen.  Objective: Vitals:   12/27/18 0800 12/27/18 0812 12/27/18 1100 12/27/18 1200  BP: 96/67     Pulse: (!) 104 91 (!) 102   Resp: 15  16   Temp: 98.5 F (36.9 C)   97.7 F (36.5 C)  TempSrc: Oral   Oral  SpO2: 98%  98%   Weight:      Height:        Intake/Output Summary (Last 24 hours) at  12/27/2018 1429 Last data filed at 12/27/2018 2355 Gross per 24 hour  Intake 765.98 ml  Output 4515 ml  Net -3749.02 ml   Filed Weights   12/25/18 0313 12/26/18 0357 12/27/18 0421  Weight: 72.2 kg 74.5 kg 69.2 kg   Examination: Physical Exam:  Constitutional: Well-nourished, well-developed African-American male sitting up in bed complaining of his food and wanting eggs. Eyes: Lids and conjunctive are normal.  Sclera anicteric ENMT: External ears and nose appear normal.  Grossly normal hearing Neck: Appears supple with some JVD Respiratory: Diminished auscultation bilaterally no appreciable wheezing, rales, rhonchi.  Did have some mild crackles and coarse breath sounds but had unlabored breathing and is not wearing any supplemental oxygen via nasal cannula Cardiovascular: Tachycardic rate but regular rhythm.  Has a 3 out of 6 systolic murmur.  Has 2-3+ lower extremity pitting edema again Abdomen: Soft, nontender, nondistended.  Bowel sounds present in 4 quadrants GU: Deferred Musculoskeletal: No contractures or cyanosis.  No joint deformities in upper extremities Skin: No appreciable rashes or lesions on limited skin evaluation but patient does have tattoos diffusely scattered throughout his body Neurologic: Cranial nerves II through XII grossly intact no appreciable focal deficits. Psychiatric: Normal judgment and insight.  Patient is awake, alert, oriented x3.  Slightly agitated mood  Data Reviewed: I have personally reviewed following labs and imaging studies  CBC: Recent Labs  Lab 12/24/18 2233 12/25/18 0641 12/26/18 0417 12/27/18 0526  WBC 6.7 9.0 21.7* 14.1*  NEUTROABS  --   --   --  12.1*  HGB 12.9* 13.4 12.3* 12.6*  HCT 41.1 42.8 37.6* 39.0  MCV 83.0 81.5 80.5 81.1  PLT 189 174 172 153   Basic Metabolic Panel: Recent Labs  Lab 12/24/18 2233 12/25/18 0641 12/25/18 2359 12/26/18 0417 12/27/18 0526  NA 135 136 133* 133* 131*  K 4.3 4.9 3.9 3.9 3.6  CL 98 98 95*  95* 89*  CO2 27 29 29 27 31   GLUCOSE 97 112* 108* 123* 124*  BUN 31* 32* 39* 37* 29*  CREATININE 1.15 1.28* 1.26* 1.26* 1.15  CALCIUM 8.9 9.1 8.6* 8.5* 8.6*  MG  --  1.9  --  1.9 1.5*  PHOS  --   --   --  2.4* 2.3*   GFR: Estimated Creatinine Clearance: 80.2 mL/min (by C-G formula based on SCr of 1.15 mg/dL). Liver Function Tests: Recent Labs  Lab 12/24/18 2237 12/26/18 0417 12/27/18 7322  AST 55* 44* 37  ALT ALKPHOS 112 91 91  BILITOT 2.4* 2.5* 2.2*  PROT 7.7 6.5 6.8  ALBUMIN 3.3* 2.8* 2.7*   No results for input(s): LIPASE, AMYLASE in the last 168 hours. No results for input(s): AMMONIA in the last 168 hours. Coagulation Profile: Recent Labs  Lab 12/25/18 1712 12/26/18 0417 12/27/18 0526  INR 1.6* 1.9* 2.3*   Cardiac Enzymes: Recent Labs  Lab 12/24/18 2237 12/26/18 0417 12/26/18 0924  TROPONINI 0.08* 0.07* 0.06*   BNP (last 3 results) No results for input(s): PROBNP in the last 8760 hours. HbA1C: Recent Labs    12/27/18 0526  HGBA1C 6.8*   CBG: No results for input(s): GLUCAP in the last 168 hours. Lipid Profile: No results for input(s): CHOL, HDL, LDLCALC, TRIG, CHOLHDL, LDLDIRECT in the last 72 hours. Thyroid Function Tests: No results for input(s): TSH, T4TOTAL, FREET4, T3FREE, THYROIDAB in the last 72 hours. Anemia Panel: Recent Labs    12/27/18 0526  VITAMINB12 718  FOLATE 7.3  FERRITIN 131  TIBC 301  IRON 14*  RETICCTPCT 1.9   Sepsis Labs: Recent Labs  Lab 12/25/18 2359 12/26/18 0417  LATICACIDVEN 2.1* 1.4    Recent Results (from the past 240 hour(s))  SARS Coronavirus 2 (CEPHEID - Performed in [facility] hospital lab), Hosp Order     Status: None   Collection Time: 12/24/18 10:54 PM  Result Value Ref Range Status   SARS Coronavirus 2 NEGATIVE NEGATIVE Final    Comment: (NOTE) If result is NEGATIVE SARS-CoV-2 target nucleic acids are NOT DETECTED. The SARS-CoV-2 RNA is generally detectable in upper and lower    respiratory specimens during the acute phase of infection. The lowest  concentration of SARS-CoV-2 viral copies this assay can detect is 250  copies / mL. A negative result does not preclude SARS-CoV-2 infection  and should not be used as the sole basis for treatment or other  patient management decisions.  A negative result may occur with  improper specimen collection / handling, submission of specimen other  than nasopharyngeal swab, presence of viral mutation(s) within the  areas targeted by this assay, and inadequate number of viral copies  (<250 copies / mL). A negative result must be combined with clinical  observations, patient history, and epidemiological information. If result is POSITIVE SARS-CoV-2 target nucleic acids are DETECTED. The SARS-CoV-2 RNA is generally detectable in upper and lower  respiratory specimens dur ing the acute phase of infection.  Positive  results are indicative of active infection with SARS-CoV-2.  Clinical  correlation with patient history and other diagnostic information is  necessary to determine patient infection status.  Positive results do  not rule out bacterial infection or co-infection with other viruses. If result is PRESUMPTIVE POSTIVE SARS-CoV-2 nucleic acids MAY BE PRESENT.   A presumptive positive result was obtained on the submitted specimen  and confirmed on repeat testing.  While 2019 novel coronavirus  (SARS-CoV-2) nucleic acids may be present in the submitted sample  additional confirmatory testing may be necessary for epidemiological  and / or clinical management purposes  to differentiate between  SARS-CoV-2 and other Sarbecovirus currently known to infect humans.  If clinically indicated additional testing with an alternate test  methodology [id number]) is advised. The SARS-CoV-2 RNA is generally  detectable in upper and lower respiratory sp ecimens during the acute  phase of infection. The expected result is Negative. Fact  Sheet for Patients:  [employer].cy Fact Sheet for Healthcare Providers: https://pope.com/  This test is not yet approved or cleared by the [place] and has been authorized for detection and/or diagnosis of SARS-CoV-2 by FDA under an Emergency Use Authorization (EUA).  This EUA will remain in effect (meaning this test can be used) for the duration of the COVID-19 declaration under Section 564(b)(1) of the Act, 21 U.S.C. section 360bbb-3(b)(1), unless the authorization is terminated or revoked sooner. Performed at [facility], [address] [place], [place] [address]      Radiology Studies: Dg Chest Port 1 View  Result Date: 12/27/2018 CLINICAL DATA:  Shortness of breath. EXAM: PORTABLE CHEST 1 VIEW COMPARISON:  Chest x-ray dated Dec 24, 2018. FINDINGS: Stable mild cardiomegaly. Normal mediastinal contours. Normal pulmonary vascularity. No focal consolidation, pleural effusion, or pneumothorax. No acute osseous abnormality. IMPRESSION: No active disease. Electronically Signed   By: Obie Dredge M.D.   On: 12/27/2018 08:00   Ct Maxillofacial Wo Contrast  Result Date: 12/26/2018 CLINICAL DATA:  Facial pain and swelling EXAM: CT MAXILLOFACIAL WITHOUT CONTRAST TECHNIQUE: Multidetector CT imaging of the maxillofacial structures was performed. Multiplanar CT image reconstructions were also generated. COMPARISON:  None. FINDINGS: Osseous: No acute fracture is identified. Diffuse dental caries are identified. No periapical abscess is noted. Orbits: Orbits are within normal limits. Sinuses: Paranasal sinuses demonstrate mucosal thickening bilaterally within the maxillary antra. Air-fluid level is noted within the left maxillary antrum consistent with acute sinusitis. Soft tissues: Soft tissue swelling is noted in the left face surrounding the maxilla and mandible. No focal hematoma is noted. Some subcutaneous air is noted  along the anterior aspect of the maxilla. This likely represents some localized infection. Limited intracranial: Within normal limits. IMPRESSION: No acute bony abnormality is noted. Soft tissue swelling is seen with some subcutaneous air consistent with focal inflammatory change. Multiple dental caries are noted. Inflammatory changes in the paranasal sinuses with air-fluid level in the left maxillary antrum consistent with acute sinusitis. Electronically Signed   By: Alcide Clever M.D.   On: 12/26/2018 21:35   Scheduled Meds:  diphenhydrAMINE       aspirin EC  81 mg Oral Daily   atorvastatin  40 mg Oral q1800   Chlorhexidine Gluconate Cloth  6 each Topical Daily   digoxin  0.125 mg Oral Daily   furosemide  40 mg Intravenous Q6H   losartan  25 mg Oral QHS   mometasone-formoterol  2 puff Inhalation BID   nicotine  14 mg Transdermal Daily   spironolactone  25 mg Oral Daily   warfarin  1 mg Oral ONCE-1800   Warfarin - Pharmacist Dosing Inpatient   Does not apply q1800   Continuous Infusions:  clindamycin (CLEOCIN) IV 600 mg (12/27/18 1331)   heparin 1,200 Units/hr (12/27/18 0921)    LOS: 2 days   Merlene Laughter, DO [facility] PAGER is on AMION  If 7PM-7AM, please contact night-coverage [email or web address] Password [facility] 12/27/2018, 2:29 PM

## 2018-12-27 NOTE — Progress Notes (Signed)
ANTICOAGULATION CONSULT NOTE - Follow Up Consult  Pharmacy Consult for Heparin Indication: LV thrombus  Allergies  Allergen Reactions  . Hydrocodone Hives  . Lisinopril Swelling and Other (See Comments)    Facial swelling/angioedema  . Prednisone Shortness Of Breath, Nausea Only, Swelling and Other (See Comments)    Also made chest feel tight and genital area, legs, and face became swollen badly  . Penicillins Hives and Swelling     Has patient had a PCN reaction causing immediate rash, facial/tongue/throat swelling, SOB or lightheadedness with hypotension: Yes Has patient had a PCN reaction causing severe rash involving mucus membranes or skin necrosis: No Has patient had a PCN reaction that required hospitalization: No Has patient had a PCN reaction occurring within the last 10 years: No If all of the above answers are "NO", then may proceed with Cephalosporin use.     Patient Measurements: Height: 5\' 11"  (180.3 cm) Weight: 152 lb 8.9 oz (69.2 kg) IBW/kg (Calculated) : 75.3 Heparin Dosing Weight:   Vital Signs: Temp: 98.2 F (36.8 C) (05/07 0004) Temp Source: Oral (05/07 0004) BP: 97/65 (05/07 0400) Pulse Rate: 108 (05/07 0401)  Labs: Recent Labs    12/24/18 2233 12/24/18 2237 12/25/18 4239 12/25/18 1712  12/25/18 2359 12/26/18 0417 12/26/18 0924 12/26/18 1611 12/26/18 2215  HGB 12.9*  --  13.4  --   --   --  12.3*  --   --   --   HCT 41.1  --  42.8  --   --   --  37.6*  --   --   --   PLT 189  --  174  --   --   --  172  --   --   --   APTT  --   --   --  29  --   --   --   --   --   --   LABPROT  --   --   --  18.6*  --   --  21.9*  --   --   --   INR  --   --   --  1.6*  --   --  1.9*  --   --   --   HEPARINUNFRC  --   --   --   --    < > 0.59  --  0.80* 0.60 0.54  CREATININE 1.15  --  1.28*  --   --  1.26* 1.26*  --   --   --   TROPONINI  --  0.08*  --   --   --   --  0.07* 0.06*  --   --    < > = values in this interval not displayed.    Estimated  Creatinine Clearance: 73.2 mL/min (A) (by C-G formula based on SCr of 1.26 mg/dL (H)).   Medications:  Infusions:  . clindamycin (CLEOCIN) IV Stopped (12/26/18 1836)  . heparin 1,200 Units/hr (12/26/18 1002)    Assessment: Patient with heparin level at goal.  No heparin issues noted.  Goal of Therapy:  Heparin level 0.3-0.7 units/ml Monitor platelets by anticoagulation protocol: Yes   Plan:  Continue heparin drip at current rate Recheck level with AM labs  Darlina Guys, Jacquenette Shone Crowford 12/27/2018,4:46 AM

## 2018-12-27 NOTE — Progress Notes (Signed)
PROGRESS NOTE    Mark [name]  IDC:[id number] DOB: [birth year] DOA: 12/24/2018 PCP: Massie Maroon, FNP  Brief Narrative:  HPI per Dr. Darreld Mclean on 12/25/2018 Mark [name] is a 43 y.o. male with medical history significant for chronic systolic CHF (EF 87-57% with severe diffuse hypokinesis by TTE 09/28/2017), NICM by cardiac cath 2017, history of right MCA CVA 2019, asthma, and substance use who presents to the ED for progressive shortness of breath.  Patient states he is running low on his home Lasix which he has been cutting in half in order to extend the duration of his medications.  He ran out completely about 4 days ago.  He has had progressive swelling of both of his legs as well as shortness of breath and orthopnea.  He denies any chest pain, cough, fever, dysuria.  He for smoking with 1 pack of cigarettes lasting about 3-4 days.  He denies a significant alcohol use.  He admits to occasional cocaine use and says last use was about 2 weeks ago.  ED Course:  Initial vitals showed BP 114/94, pulse 110, RR 25, temp 97.6 Fahrenheit, SPO2 100% on nonrebreather.  Labs notable for WBC 6.7, hemoglobin 12.9, platelets 189, potassium 4.3, BUN 31, creatinine 1.15, troponin I 0.08, BNP 3973.6, digoxin level 0.5, SARS-CoV-2 testing negative.  Portable chest x-ray showed enlarged cardiac silhouette without focal consolidation, pleural effusion or interstitial edema.  Patient was given IV Lasix 40 mg x 1 and placed on BiPAP with improvement in respiratory status.  The hospital service was consulted to admit for further evaluation and management.  **Interim History The patient was weaned off of BiPAP and cardiology consulted and patient is currently being diuresed.  Patient is also been started on a heparin drip with a warfarin bridge for his left ventricular thrombus.  Per cardiology he is not a candidate for ICD given his noncompliance and polysubstance abuse and they are worried about him  being on Coumadin given his noncompliance will continue at this time and per them is not a candidate for advanced therapies but may be if he remains off substances.  Assessment & Plan:   Principal Problem:   Acute on chronic systolic (congestive) heart failure (HCC) Active Problems:   Asthma   Elevated troponin I level   History of right MCA stroke   Demand ischemia (HCC)   LV (left ventricular) mural thrombus without MI  Acute Respiratory Failure with Hypoxia in the setting of acute CHF exacerbation requiring noninvasive positive pressure ventilation with BiPAP -Improved -Patient was placed on BiPAP initially and now been transitioned off -Currently saturating on room air; SARS-CoV-2 Negative  -Repeat CXR this AM showed "Stable mild cardiomegaly. Normal mediastinal contours. Normal pulmonary vascularity. No focal consolidation, pleural effusion, or pneumothorax. No acute osseous abnormality." -Continue diuresis per cardiology as below  Acute on Chronic Combined Systolic and Diastolic CHF exacerbation with recent echo 5 to 10% and severe right ventricular systolic dysfunction and Grade 2 DD -EF 10-15% by echocardiogram February 2019, NICM by cardiac cath 2017.   -BNP on Admission was 3,973.6  -Patient has significant volume overload on exam with improvement in respiratory symptoms on BiPAP. -Admit to stepdown unit for as needed BiPAP and now of off of it -IV Lasix 40 mg twice daily changed to q6h by Cardiology and will continue for now  -Resume home Losartan 25 mg po qHS, Spironolactone 25 mg po Daily, and Digoxin 0.125 mg po Daily -Strict I's/O's and Daily Weights; Patient  is -7 lbs since admission Baseline Weight is 147 lbs per Cardiology  -He is -4,983 mL since admission -Will place on Heart Healthy Diet and Fluid Restrict to 1500 mL -Repeat Echocardiogram this visit showed moderate LV dilatation and severe systolic dysfunction with LVEF of 5 to 10% and diffuse hypokinesis.  There is  also new large apical thrombus measuring 2.7 x 1.6 cm and grade 2 diastolic dysfunction with severely elevated filling pressures -Cardiology giving Metolazone 2.5 mg po x1 again today  -Dr. Delton See currently not a candidate for advanced therapies and no indication for ICD now as he has Significant Substance Abuse  Elevated Troponin Level -Mild troponin elevation of 0.08, likely due to demand ischemia.  -He has chronically elevated troponin on review of labs.   -We will continue to trend and trending down as went from 0.08 -> 0.07 -> 0.06 -Continue to Monitor for Chest Pain   History of Right MCA Stroke -No residual deficit. -Had an MRI of Brain on 09/28/2017 which showed moderate to large right MCA distribution infarct. There was also associated slow/absent flow within the associated right M2 branch -Suspected to be cardioembolic, however he was not started on anticoagulation due to his history of nonadherence to medication as an outpateitn  -He has been on Aspirin 81 mg daily.  He was on statin in the past but does not appear to be taking it. -Continue Aspirin 81 mg Daily -Start Atorvastatin 40 mg Daily -Per Cardiology Ideally needs to be on Warfarin but not compliant enough per them but since he was started on admission, will continue it and Cardiology states they will follow patient as an outpatient for his Coumadin **(Could not get in touch with Dr. Delton See today but discussed with the PA who directly spoke to Dr. Delton See)  Asthma -Not obviously wheezing on examination.   -Respiratory symptoms more consistent with CHF exacerbation. -Continue Dulera 2 puff IH BID and Albuterol 2.5 mg Neb q4hprn Wheezing and SOB  Substance Use Disorder/Tobacco use: -Patient acknowledges he needs to quit tobacco. Smoking Cessation Counseling given again  -He admits to cocaine use with last use about 2 weeks ago. -C/w Nicotine Patch 14 mg TD q24h -Obtained UDS and was + for Cocaine   Lactic  Acidosis -Improved. Lactic Acid Level went from 2.1 -> 1.4 -Will not continue to Trend   Hyponatremia -Patient's Na+ went from 136 -> 133 -> 131 -In the Setting of Hypervolemic Hyponatremia -C/w Diuresis per Cardiology -Repeat CMP in AM  Leukocytosis in the Setting of Dental Infection -WBC went from 6.7 -> 9.0 -> 21.7 -? Reactive and in the setting of Diuresis but now likely source is mouth as patient has swelling and pain in Jaw and specifically left side of face -Patient is Currently Afebrile with a Tmax of 98.5 in the last 24 hours -Wanted to obtain a DG Orthopantogram but no longer available at [facility] -Instead ordered CT Maxillofacial and showed "No acute bony abnormality is noted. Soft tissue swelling is seen with some subcutaneous air consistent with focal inflammatory change. Multiple dental caries are noted. Inflammatory changes in the paranasal sinuses with air-fluid level in the left maxillary antrum consistent with acute sinusitis." -Patient was started on Clindamycin IV given PCN Allergy -Have attempted to reach our In-house Dentist Dr. Kristin Bruins for evaluation but no response after calling and will try again  -Continue to Monitor for S/Sx of Infection -Repeat CBC in AM   Normocytic Anemia  -Patient's hemoglobin/hematocrit went from 12.9/41.1 -> 13.4/42.8 ->  12.8/37.6 -> 12.6/39.0 -Continue monitor for signs and symptoms of bleeding as patient not anticoagulated with heparin drip as well as Coumadin -Checked Anemia Panel and showed iron level 14, U IBC of 287, TIBC of 301, saturation ratios of 5%, ferritin level 131, folate level 7.3, and vitamin B12 of 718 -Repeat CBC in a.m.  Hypophosphatemia -Patient's phosphorus levels morning was 2.3 -Replete with p.o. K-Phos Neutral 500 mg x 1 -Continue to monitor replete as necessary  Hypomagnesemia -Patient's Mag Level was 1.5 this AM -Replete with IV Mag Sulfate 2 grams -Continue to Monitor and Replete as Necessary -Repeat  Mag Level in AM   Renal Insuffiencey, improving  -Patient's BUN/creatinine went from 31/1.15 ->  37/1.26 -> 29/1.15 -In the setting of diuresis -Continue monitor and trend renal function -Avoid nephrotoxic medications if possible as well as contrast dyes and hypotension -Repeat CMP in AM   Left Ventricle Thrombus -Patient is now on Warfarin with Heparin gtt -INR was 2.3 this AM; Pharmacy adjusting -Per Pharmacy: "Patient will need anticoagulation bridge for at least the first 5 days of warfarin therapy and until two days of consecutive INR readings >2" and liklely can D/C Heparin gtt tomorrow  -Cardiology concerned about his noncompliance but since he was started on Heparin gtt and Coumadin on Admission will continue and per discussion with CARDs PA (Could not get in touch with Dr. Delton See after several attempts) she discussed with Dr. Delton See who states to continue Coumadin and Cardiology will follow up at D/C and continue to Monitor INR's  -Will need close outpatient Monitoring if this is continued and if patient becomes more compliant   Hyperglycemia in the setting of New Onset Diabetes -Patient's Blood Sugar has been ranging from 97-123 -Checked HbA1c and was 6.8; Last HbA1c was 6.4 -Continue to Monitor Blood Sugars Carefully -IF necessary will place on Sensitive Novolog SSI  Hyperbilirubinemia -Likely Reactive -T Bili was 2.4 on Admission went to 2.5 and is now 2.2 -Continue to Monitor and Trend -Repeat CMP in AM   Elevated AST -In the setting of Volume Overload -Trending Down as went from 55 -> 44 -> 37 -Continue to Monitor and Trend Closely  -Repeat CMP in AM   DVT prophylaxis: Anticoagulated with Heparin gtt and Coumadin Code Status: FULL CODE Family Communication: No family present at bedside  Disposition Plan: Remain Inpatient as currently getting diuresed; Will need to be diuresed back to dry weight  Consultants:   Cardiology Dr. Delton See   Procedures:   ECHOCARDIOGRAM IMPRESSIONS    1. The right ventricle has severely reduced systolic function. The cavity was severely enlarged. There is no increase in right ventricular wall thickness.  2. Left atrial size was mildly dilated.  3. Right atrial size was moderately dilated.  4. Severely thickened tricuspid valve leaflets.  5. The mitral valve is grossly normal. Mitral valve regurgitation is moderate to severe by color flow Doppler. The MR jet is posteriorly-directed.  6. The tricuspid valve is grossly normal.  7. The pulmonic valve was grossly normal. Pulmonic valve regurgitation is moderate is mild by color flow Doppler.  SUMMARY   Moderate LV dilatation and severe systolic dysfunction with LVEF 5-10% and diffuse hypokinesis. 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 Grade 2 diastolic dysfunction with severely elevated filling pressures.   Right ventricle is severely dilated and now with severe systolic dysfunction (previously mild to moderate). RVSP is 42 mmHg consistent with moderate pulmonary hypertension, however underestimated in teh settings of  severe RV dysfunction. RVSP previously 56 mmHg.   Moderate to severe mitral and severe tricuspid regurgitation.  FINDINGS  Left Ventricle: The left ventricle has severely reduced systolic function, wih an ejection fraction of 5-10%. The cavity size was normal. There is no increase in left ventricular wall thickness. Definity contrast agent was given IV to delineate the left  ventricular endocardial borders.  Right Ventricle: The right ventricle has severely reduced systolic function. The cavity was severely enlarged. There is no increase in right ventricular wall thickness.  Left Atrium: Left atrial size was mildly dilated.  Right Atrium: Right atrial size was moderately dilated. Right atrial pressure is estimated at 15 mmHg.  Interatrial Septum: No atrial level shunt detected by color flow  Doppler.  Pericardium: There is no evidence of pericardial effusion.  Mitral Valve: The mitral valve is grossly normal. Mitral valve regurgitation is moderate to severe by color flow Doppler. The MR jet is posteriorly-directed.  Tricuspid Valve: The tricuspid valve is grossly normal. Tricuspid valve regurgitation was not visualized by color flow Doppler. The tricuspid valve is severely thickened.  Aortic Valve: The aortic valve is normal in structure. Aortic valve regurgitation was not assessed by color flow Doppler.  Pulmonic Valve: The pulmonic valve was grossly normal. Pulmonic valve regurgitation is moderate is mild by color flow Doppler.  Venous: The inferior vena cava is normal in size with greater than 50% respiratory variability.    +--------------+--------++  LEFT VENTRICLE                 +----------------+---------++ +--------------+--------++       Diastology                    PLAX 2D                        +----------------+---------++ +--------------+--------++       LV e' lateral:   8.03 cm/s    LVIDd:         5.59 cm         +----------------+---------++ +--------------+--------++       LV E/e' lateral: 12.3         LVIDs:         5.32 cm         +----------------+---------++ +--------------+--------++       LV e' medial:    3.75 cm/s    LV PW:         0.96 cm         +----------------+---------++ +--------------+--------++       LV E/e' medial:  26.4         LV IVS:        0.90 cm         +----------------+---------++ +--------------+--------++  LVOT diam:     2.20 cm    +--------------+--------++  LV SV:         17 ml      +--------------+--------++  LV SV Index:   8.69       +--------------+--------++  LVOT Area:     3.80 cm   +--------------+--------++                            +--------------+--------++   +------------------+---------++  LV Volumes (MOD)               +------------------+---------++  LV area d, A2C:    59.90  cm   +------------------+---------++  LV  area d, A4C:    53.00 cm   +------------------+---------++  LV area s, A2C:    51.80 cm   +------------------+---------++  LV area s, A4C:    47.70 cm   +------------------+---------++  LV major d, A2C:   10.80 cm    +------------------+---------++  LV major d, A4C:   10.40 cm    +------------------+---------++  LV major s, A2C:   9.93 cm     +------------------+---------++  LV major s, A4C:   9.73 cm     +------------------+---------++  LV vol d, MOD A2C: 275.0 ml    +------------------+---------++  LV vol d, MOD A4C: 222.0 ml    +------------------+---------++  LV vol s, MOD A2C: 230.0 ml    +------------------+---------++  LV vol s, MOD A4C: 194.0 ml    +------------------+---------++  LV SV MOD A2C:     45.0 ml     +------------------+---------++  LV SV MOD A4C:     222.0 ml    +------------------+---------++  LV SV MOD BP:      39.2 ml     +------------------+---------++  +---------------+---------++  RIGHT VENTRICLE             +---------------+---------++  RV S prime:     8.93 cm/s   +---------------+---------++  TAPSE (M-mode): 1.3 cm      +---------------+---------++  +-----------+-------++----------++  LEFT ATRIUM          Index        +-----------+-------++----------++  LA diam:    4.20 cm  2.19 cm/m   +-----------+-------++----------++  +------------+-----------++  AORTIC VALVE               +------------+-----------++  LVOT Vmax:   45.00 cm/s    +------------+-----------++  LVOT Vmean:  27.100 cm/s   +------------+-----------++  LVOT VTI:    0.048 m       +------------+-----------++   +-------------+-------++  AORTA                   +-------------+-------++  Ao Root diam: 3.00 cm   +-------------+-------++  Ao Asc diam:  2.70 cm   +-------------+-------++  +--------------+----------++ +---------------+-----------++  MITRAL VALVE                 TRICUSPID VALVE                +--------------+----------++ +---------------+-----------++  MV Area (PHT): 5.38 cm      TR Peak grad:   26.0 mmHg     +--------------+----------++ +---------------+-----------++  MV PHT:        40.89 msec    TR Vmax:        255.00 cm/s   +--------------+----------++ +---------------+-----------++  MV Decel Time: 141 msec     +--------------+----------++ +--------------+-------+ +--------------+----------++  SHUNTS                   MV E velocity: 99.00 cm/s   +--------------+-------+ +--------------+----------++  Systemic VTI:  0.05 m    MV A velocity: 46.30 cm/s   +--------------+-------+ +--------------+----------++  Systemic Diam: 2.20 cm   MV E/A ratio:  2.14         +--------------+-------+ +--------------+----------++   Antimicrobials: Anti-infectives (From admission, onward)   Start     Dose/Rate Route Frequency Ordered Stop   12/26/18 1800  clindamycin (CLEOCIN) IVPB 600 mg     600 mg 100 mL/hr over 30 Minutes Intravenous Every 8 hours 12/26/18 1709       Subjective: Seen and examined at bedside and  states that his shortness of breath is improved but his mouth is been hurting and was not able to tolerate much of the diet and only eating oatmeal.  No lightheadedness or dizziness.  After his antibiotics were run he started developing itching and sweating and complained of dry mouth so he was given Benadryl.  Discussed with cardiology about continuing Coumadin and heparin drip and they will continue Coumadin at discharge and follow the patient closely as an outpatient.  Patient states he slept okay and had no other concerns or complaints except face pain but still remains swollen.  Objective: Vitals:   12/27/18 0800 12/27/18 0812 12/27/18 1100 12/27/18 1200  BP: 96/67     Pulse: (!) 104 91 (!) 102   Resp: 15  16   Temp: 98.5 F (36.9 C)   97.7 F (36.5 C)  TempSrc: Oral   Oral  SpO2: 98%  98%   Weight:      Height:        Intake/Output Summary (Last 24 hours) at  12/27/2018 1409 Last data filed at 12/27/2018 3403 Gross per 24 hour  Intake 765.98 ml  Output 4515 ml  Net -3749.02 ml   Filed Weights   12/25/18 0313 12/26/18 0357 12/27/18 0421  Weight: 72.2 kg 74.5 kg 69.2 kg   Examination: Physical Exam:  Constitutional: Well-nourished, well-developed African-American male sitting up in bed complaining of his food and wanting eggs. Eyes: Lids and conjunctive are normal.  Sclera anicteric ENMT: External ears and nose appear normal.  Grossly normal hearing Neck: Appears supple with some JVD Respiratory: Diminished auscultation bilaterally no appreciable wheezing, rales, rhonchi.  Did have some mild crackles and coarse breath sounds but had unlabored breathing and is not wearing any supplemental oxygen via nasal cannula Cardiovascular: Tachycardic rate but regular rhythm.  Has a 3 out of 6 systolic murmur.  Has 2-3+ lower extremity pitting edema again Abdomen: Soft, nontender, nondistended.  Bowel sounds present in 4 quadrants GU: Deferred Musculoskeletal: No contractures or cyanosis.  No joint deformities in upper extremities Skin: No appreciable rashes or lesions on limited skin evaluation but patient does have tattoos diffusely scattered throughout his body Neurologic: Cranial nerves II through XII grossly intact no appreciable focal deficits. Psychiatric: Normal judgment and insight.  Patient is awake, alert, oriented x3.  Slightly agitated mood  Data Reviewed: I have personally reviewed following labs and imaging studies  CBC: Recent Labs  Lab 12/24/18 2233 12/25/18 0641 12/26/18 0417 12/27/18 0526  WBC 6.7 9.0 21.7* 14.1*  NEUTROABS  --   --   --  12.1*  HGB 12.9* 13.4 12.3* 12.6*  HCT 41.1 42.8 37.6* 39.0  MCV 83.0 81.5 80.5 81.1  PLT 189 174 172 153   Basic Metabolic Panel: Recent Labs  Lab 12/24/18 2233 12/25/18 0641 12/25/18 2359 12/26/18 0417 12/27/18 0526  NA 135 136 133* 133* 131*  K 4.3 4.9 3.9 3.9 3.6  CL 98 98 95*  95* 89*  CO2 27 29 29 27 31   GLUCOSE 97 112* 108* 123* 124*  BUN 31* 32* 39* 37* 29*  CREATININE 1.15 1.28* 1.26* 1.26* 1.15  CALCIUM 8.9 9.1 8.6* 8.5* 8.6*  MG  --  1.9  --  1.9 1.5*  PHOS  --   --   --  2.4* 2.3*   GFR: Estimated Creatinine Clearance: 80.2 mL/min (by C-G formula based on SCr of 1.15 mg/dL). Liver Function Tests: Recent Labs  Lab 12/24/18 2237 12/26/18 0417 12/27/18 0526  AST  55* 44* 37  ALT ALKPHOS 112 91 91  BILITOT 2.4* 2.5* 2.2*  PROT 7.7 6.5 6.8  ALBUMIN 3.3* 2.8* 2.7*   No results for input(s): LIPASE, AMYLASE in the last 168 hours. No results for input(s): AMMONIA in the last 168 hours. Coagulation Profile: Recent Labs  Lab 12/25/18 1712 12/26/18 0417 12/27/18 0526  INR 1.6* 1.9* 2.3*   Cardiac Enzymes: Recent Labs  Lab 12/24/18 2237 12/26/18 0417 12/26/18 0924  TROPONINI 0.08* 0.07* 0.06*   BNP (last 3 results) No results for input(s): PROBNP in the last 8760 hours. HbA1C: Recent Labs    12/27/18 0526  HGBA1C 6.8*   CBG: No results for input(s): GLUCAP in the last 168 hours. Lipid Profile: No results for input(s): CHOL, HDL, LDLCALC, TRIG, CHOLHDL, LDLDIRECT in the last 72 hours. Thyroid Function Tests: No results for input(s): TSH, T4TOTAL, FREET4, T3FREE, THYROIDAB in the last 72 hours. Anemia Panel: Recent Labs    12/27/18 0526  VITAMINB12 718  FOLATE 7.3  FERRITIN 131  TIBC 301  IRON 14*  RETICCTPCT 1.9   Sepsis Labs: Recent Labs  Lab 12/25/18 2359 12/26/18 0417  LATICACIDVEN 2.1* 1.4    Recent Results (from the past 240 hour(s))  SARS Coronavirus 2 (CEPHEID - Performed in [facility] hospital lab), Hosp Order     Status: None   Collection Time: 12/24/18 10:54 PM  Result Value Ref Range Status   SARS Coronavirus 2 NEGATIVE NEGATIVE Final    Comment: (NOTE) If result is NEGATIVE SARS-CoV-2 target nucleic acids are NOT DETECTED. The SARS-CoV-2 RNA is generally detectable in upper and lower   respiratory specimens during the acute phase of infection. The lowest  concentration of SARS-CoV-2 viral copies this assay can detect is 250  copies / mL. A negative result does not preclude SARS-CoV-2 infection  and should not be used as the sole basis for treatment or other  patient management decisions.  A negative result may occur with  improper specimen collection / handling, submission of specimen other  than nasopharyngeal swab, presence of viral mutation(s) within the  areas targeted by this assay, and inadequate number of viral copies  (<250 copies / mL). A negative result must be combined with clinical  observations, patient history, and epidemiological information. If result is POSITIVE SARS-CoV-2 target nucleic acids are DETECTED. The SARS-CoV-2 RNA is generally detectable in upper and lower  respiratory specimens dur ing the acute phase of infection.  Positive  results are indicative of active infection with SARS-CoV-2.  Clinical  correlation with patient history and other diagnostic information is  necessary to determine patient infection status.  Positive results do  not rule out bacterial infection or co-infection with other viruses. If result is PRESUMPTIVE POSTIVE SARS-CoV-2 nucleic acids MAY BE PRESENT.   A presumptive positive result was obtained on the submitted specimen  and confirmed on repeat testing.  While 2019 novel coronavirus  (SARS-CoV-2) nucleic acids may be present in the submitted sample  additional confirmatory testing may be necessary for epidemiological  and / or clinical management purposes  to differentiate between  SARS-CoV-2 and other Sarbecovirus currently known to infect humans.  If clinically indicated additional testing with an alternate test  methodology [phone]) is advised. The SARS-CoV-2 RNA is generally  detectable in upper and lower respiratory sp ecimens during the acute  phase of infection. The expected result is Negative. Fact  Sheet for Patients:  [employer]cy Fact Sheet for Healthcare Providers: https://pope.com/ This test  is not yet approved or cleared by the [place] FDA and has been authorized for detection and/or diagnosis of SARS-CoV-2 by FDA under an Emergency Use Authorization (EUA).  This EUA will remain in effect (meaning this test can be used) for the duration of the COVID-19 declaration under Section 564(b)(1) of the Act, 21 U.S.C. section 360bbb-3(b)(1), unless the authorization is terminated or revoked sooner. Performed at [facility], [address] [place], [place] [address]      Radiology Studies: Dg Chest Port 1 View  Result Date: 12/27/2018 CLINICAL DATA:  Shortness of breath. EXAM: PORTABLE CHEST 1 VIEW COMPARISON:  Chest x-ray dated Dec 24, 2018. FINDINGS: Stable mild cardiomegaly. Normal mediastinal contours. Normal pulmonary vascularity. No focal consolidation, pleural effusion, or pneumothorax. No acute osseous abnormality. IMPRESSION: No active disease. Electronically Signed   By: Obie DredgeWilliam T Derry M.D.   On: 12/27/2018 08:00   Ct Maxillofacial Wo Contrast  Result Date: 12/26/2018 CLINICAL DATA:  Facial pain and swelling EXAM: CT MAXILLOFACIAL WITHOUT CONTRAST TECHNIQUE: Multidetector CT imaging of the maxillofacial structures was performed. Multiplanar CT image reconstructions were also generated. COMPARISON:  None. FINDINGS: Osseous: No acute fracture is identified. Diffuse dental caries are identified. No periapical abscess is noted. Orbits: Orbits are within normal limits. Sinuses: Paranasal sinuses demonstrate mucosal thickening bilaterally within the maxillary antra. Air-fluid level is noted within the left maxillary antrum consistent with acute sinusitis. Soft tissues: Soft tissue swelling is noted in the left face surrounding the maxilla and mandible. No focal hematoma is noted. Some subcutaneous air is noted  along the anterior aspect of the maxilla. This likely represents some localized infection. Limited intracranial: Within normal limits. IMPRESSION: No acute bony abnormality is noted. Soft tissue swelling is seen with some subcutaneous air consistent with focal inflammatory change. Multiple dental caries are noted. Inflammatory changes in the paranasal sinuses with air-fluid level in the left maxillary antrum consistent with acute sinusitis. Electronically Signed   By: Alcide CleverMark  Lukens M.D.   On: 12/26/2018 21:35   Scheduled Meds:  aspirin EC  81 mg Oral Daily   atorvastatin  40 mg Oral q1800   Chlorhexidine Gluconate Cloth  6 each Topical Daily   digoxin  0.125 mg Oral Daily   furosemide  40 mg Intravenous Q6H   losartan  25 mg Oral QHS   mometasone-formoterol  2 puff Inhalation BID   nicotine  14 mg Transdermal Daily   spironolactone  25 mg Oral Daily   warfarin  1 mg Oral ONCE-1800   Warfarin - Pharmacist Dosing Inpatient   Does not apply q1800   Continuous Infusions:  clindamycin (CLEOCIN) IV 600 mg (12/27/18 1331)   heparin 1,200 Units/hr (12/27/18 0921)    LOS: 2 days   Merlene Laughtermair Latif Leylanie Woodmansee, DO [facility] PAGER is on AMION  If 7PM-7AM, please contact night-coverage [email or web address] Password [email or web address] 12/27/2018, 2:09 PM

## 2018-12-27 NOTE — Progress Notes (Signed)
Patient complaining of dry mouth, itches and sweating. MD paged and aware. New orders given. Will continue to monitor patient.

## 2018-12-27 NOTE — Progress Notes (Signed)
Patient refused to have a MRSA PCR performed. I answered his questions and educated why it was needed. Patient still refused.

## 2018-12-27 NOTE — Progress Notes (Signed)
ANTICOAGULATION CONSULT NOTE - Initial Consult  Pharmacy Consult for warfarin and heparin bridge Indication: LV thrombus  Allergies  Allergen Reactions  . Hydrocodone Hives  . Lisinopril Swelling and Other (See Comments)    Facial swelling/angioedema  . Prednisone Shortness Of Breath, Nausea Only, Swelling and Other (See Comments)    Also made chest feel tight and genital area, legs, and face became swollen badly  . Penicillins Hives and Swelling     Has patient had a PCN reaction causing immediate rash, facial/tongue/throat swelling, SOB or lightheadedness with hypotension: Yes Has patient had a PCN reaction causing severe rash involving mucus membranes or skin necrosis: No Has patient had a PCN reaction that required hospitalization: No Has patient had a PCN reaction occurring within the last 10 years: No If all of the above answers are "NO", then may proceed with Cephalosporin use.     Patient Measurements: Height: 5\' 11"  (180.3 cm) Weight: 152 lb 8.9 oz (69.2 kg) IBW/kg (Calculated) : 75.3 Heparin Dosing Weight = TBW = 72 kg  Vital Signs: Temp: 97.7 F (36.5 C) (05/07 1200) Temp Source: Oral (05/07 1200) BP: 96/67 (05/07 0800) Pulse Rate: 102 (05/07 1100)  Labs: Recent Labs    12/24/18 2237 12/25/18 7505 12/25/18 1712  12/25/18 2359 12/26/18 0417 12/26/18 0924 12/26/18 1611 12/26/18 2215 12/27/18 0526  HGB  --  13.4  --   --   --  12.3*  --   --   --  12.6*  HCT  --  42.8  --   --   --  37.6*  --   --   --  39.0  PLT  --  174  --   --   --  172  --   --   --  153  APTT  --   --  29  --   --   --   --   --   --   --   LABPROT  --   --  18.6*  --   --  21.9*  --   --   --  25.0*  INR  --   --  1.6*  --   --  1.9*  --   --   --  2.3*  HEPARINUNFRC  --   --   --    < > 0.59  --  0.80* 0.60 0.54 0.61  CREATININE  --  1.28*  --   --  1.26* 1.26*  --   --   --  1.15  TROPONINI 0.08*  --   --   --   --  0.07* 0.06*  --   --   --    < > = values in this interval not  displayed.    Estimated Creatinine Clearance: 80.2 mL/min (by C-G formula based on SCr of 1.15 mg/dL).   Medical History: Past Medical History:  Diagnosis Date  . Asthma   . Chronic systolic CHF (congestive heart failure) (HCC)   . Cigarette smoker   . CKD (chronic kidney disease), stage II    [name] 10/01/2017  . History of echocardiogram    a. Echo 5/17 - EF 20-25%, severe diff HK, restrictive physiology, mild to mod MR, severe reduced RVSF, mod RVE, mild RAE, mod TR, PASP 48 mmHg  . Hx of cardiac cath    a. LHC 5/17 - normal coronary arteries. PA 45/25, mean 33, PCWP mean 18  . NICM (nonischemic cardiomyopathy) (HCC)   .  Stroke (HCC) 09/27/2017   "was weak on my left side; I'm fully recovered" (11/09/2017)  . Substance abuse (HCC)    cocaine, marijuana   Assessment: Patient is a 43 year old male admitted with SOB. PMH significant for HF, CVA in 2019, asthma, and substance abuse. Pt has history of medication non-compliance. Cardiology following. Diagnosed with LV thrombus and being started on warfarin with heparin bridge. Pt was not on anticoagulant prior to admission.  Baseline labs: -Hgb 13.4 -Plt 174 -INR: 1.6, elevated at baseline -aPTT: 29  Today, 12/27/18  Hgb 12.6 - slightly low but stable  Plt 153 - WNL, stable  INR 2.3 increased, therapeutic  Confirmatory HL = 0.61 is therapeutic on heparin infusion of 1200 units/hr  Confirmed with RN that heparin running at correct rate, no issues with infusion, and no signs/symptoms of bleeding or bruising.   No significant DDI with warfarin. Of note, patient is on ASA 81 mg PO daily.  Goal of Therapy:  HL 0.3-0.7 INR 2-3 Monitor platelets by anticoagulation protocol: Yes   Plan:   Warfarin 1 mg PO once this evening   Continue heparin infusion at 1200 units/hr  HL, INR, and CBC daily  Monitor for signs/symptoms of bleeding   Patient will need anticoagulation bridge for at least the first 5 days of warfarin  therapy and until two days of consecutive INR readings >2.  Cindi Carbon, PharmD 12/27/2018,2:43 PM

## 2018-12-27 NOTE — Progress Notes (Addendum)
Progress Note  Patient Name: Mark ScottJeremie C [name] Date of Encounter: 12/27/2018  Primary Cardiologist: Verne Carrowhristopher McAlhany, MD   Subjective   He feels better today with improved SOB.  Inpatient Medications    Scheduled Meds: . aspirin EC  81 mg Oral Daily  . atorvastatin  40 mg Oral q1800  . Chlorhexidine Gluconate Cloth  6 each Topical Daily  . digoxin  0.125 mg Oral Daily  . furosemide  40 mg Intravenous Q6H  . losartan  25 mg Oral QHS  . mometasone-formoterol  2 puff Inhalation BID  . nicotine  14 mg Transdermal Daily  . spironolactone  25 mg Oral Daily  . Warfarin - Pharmacist Dosing Inpatient   Does not apply q1800   Continuous Infusions: . clindamycin (CLEOCIN) IV 600 mg (12/27/18 16100608)  . heparin 1,200 Units/hr (12/26/18 1002)  . magnesium sulfate bolus IVPB 2 g (12/27/18 0827)   PRN Meds: acetaminophen **OR** acetaminophen, albuterol   Vital Signs    Vitals:   12/27/18 0421 12/27/18 0612 12/27/18 0800 12/27/18 0812  BP:  113/83    Pulse:  (!) 51  91  Resp:  14    Temp:   98.5 F (36.9 C)   TempSrc:   Oral   SpO2:  95%    Weight: 69.2 kg     Height:        Intake/Output Summary (Last 24 hours) at 12/27/2018 0833 Last data filed at 12/27/2018 0735 Gross per 24 hour  Intake 1725.98 ml  Output 5260 ml  Net -3534.02 ml   Last 3 Weights 12/27/2018 12/26/2018 12/25/2018  Weight (lbs) 152 lb 8.9 oz 164 lb 3.9 oz 159 lb 2.8 oz  Weight (kg) 69.2 kg 74.5 kg 72.2 kg     Telemetry    SR, runs of nsVT up to 9 beats - Personally Reviewed  ECG    No new - Personally Reviewed  Physical Exam   GEN: No acute distress.   Neck: +2 cm JVD Cardiac: RRR, 4/6 systolic murmurs, + S4, or gallops.  Respiratory: crackles to auscultation bilaterally. GI: Soft, nontender, non-distended  MS: + 3 edema B/L up to knees; No deformity. Neuro:  Nonfocal  Psych: Normal affect   Labs    Chemistry Recent Labs  Lab 12/24/18 2237  12/25/18 2359 12/26/18 0417 12/27/18 0526  NA   --    < > 133* 133* 131*  K  --    < > 3.9 3.9 3.6  CL  --    < > 95* 95* 89*  CO2  --    < > 29 27 31   GLUCOSE  --    < > 108* 123* 124*  BUN  --    < > 39* 37* 29*  CREATININE  --    < > 1.26* 1.26* 1.15  CALCIUM  --    < > 8.6* 8.5* 8.6*  PROT 7.7  --   --  6.5 6.8  ALBUMIN 3.3*  --   --  2.8* 2.7*  AST 55*  --   --  44* 37  ALT 31  --   --  25 25  ALKPHOS 112  --   --  91 91  BILITOT 2.4*  --   --  2.5* 2.2*  GFRNONAA  --    < > >60 >60 >60  GFRAA  --    < > >60 >60 >60  ANIONGAP  --    < > 9 11 11    < > =  values in this interval not displayed.    Hematology Recent Labs  Lab 12/25/18 0641 12/26/18 0417 12/27/18 0526  WBC 9.0 21.7* 14.1*  RBC 5.25 4.67 4.81  4.81  HGB 13.4 12.3* 12.6*  HCT 42.8 37.6* 39.0  MCV 81.5 80.5 81.1  MCH 25.5* 26.3 26.2  MCHC 31.3 32.7 32.3  RDW 18.6* 18.1* 18.5*  PLT 174 172 153   Cardiac Enzymes Recent Labs  Lab 12/24/18 2237 12/26/18 0417 12/26/18 0924  TROPONINI 0.08* 0.07* 0.06*   No results for input(s): TROPIPOC in the last 168 hours.   BNP Recent Labs  Lab 12/24/18 2237  BNP 3,973.6*    DDimer No results for input(s): DDIMER in the last 168 hours.   Radiology    Dg Chest Port 1 View  Result Date: 12/27/2018 CLINICAL DATA:  Shortness of breath. EXAM: PORTABLE CHEST 1 VIEW COMPARISON:  Chest x-ray dated Dec 24, 2018. FINDINGS: Stable mild cardiomegaly. Normal mediastinal contours. Normal pulmonary vascularity. No focal consolidation, pleural effusion, or pneumothorax. No acute osseous abnormality. IMPRESSION: No active disease. Electronically Signed   By: Obie Dredge M.D.   On: 12/27/2018 08:00   Ct Maxillofacial Wo Contrast  Result Date: 12/26/2018 CLINICAL DATA:  Facial pain and swelling EXAM: CT MAXILLOFACIAL WITHOUT CONTRAST TECHNIQUE: Multidetector CT imaging of the maxillofacial structures was performed. Multiplanar CT image reconstructions were also generated. COMPARISON:  None. FINDINGS: Osseous: No acute  fracture is identified. Diffuse dental caries are identified. No periapical abscess is noted. Orbits: Orbits are within normal limits. Sinuses: Paranasal sinuses demonstrate mucosal thickening bilaterally within the maxillary antra. Air-fluid level is noted within the left maxillary antrum consistent with acute sinusitis. Soft tissues: Soft tissue swelling is noted in the left face surrounding the maxilla and mandible. No focal hematoma is noted. Some subcutaneous air is noted along the anterior aspect of the maxilla. This likely represents some localized infection. Limited intracranial: Within normal limits. IMPRESSION: No acute bony abnormality is noted. Soft tissue swelling is seen with some subcutaneous air consistent with focal inflammatory change. Multiple dental caries are noted. Inflammatory changes in the paranasal sinuses with air-fluid level in the left maxillary antrum consistent with acute sinusitis. Electronically Signed   By: Alcide Clever M.D.   On: 12/26/2018 21:35   Cardiac Studies   TTE; 12/25/18 Moderate LV dilatation and severe systolic dysfunction with LVEF 5-10% and diffuse hypokinesis. 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 Grade 2 diastolic dysfunction with severely elevated filling pressures.   Right ventricle is severely dilated and now with severe systolic dysfunction (previously mild to moderate). RVSP is 42 mmHg consistent with moderate pulmonary hypertension, however underestimated in teh settings of severe RV dysfunction. RVSP previously 56 mmHg.   Moderate to severe mitral and severe tricuspid regurgitation.   Patient Profile     43 y.o. male   Assessment & Plan    1. Acute on Chronic systolic CHF, Echo 09/2017 LVEF 5-10%, severe RV systolic dysfunction - NICM by cath 2017.Suspect etiology is polysubstance abuse - NYHAIII, - BNP 3973 - Crea 1.28->1.26->1.15 - baseline weight 147 lbs, on admission 161, now down to 152 lbs - he has not  been taking any meds at home - restarted on ASA, atorvastatin, digoxin, spironolactone, losartan - continue lasix to 40 mg iv Q6H, I will add metolazone 2.5 mg po x 1 again today - he is still volume overloaded - Not a candidate for advanced therapies down the road and need to  remain off substances  2. LV thrombus - h/o CVA - we will start coumadin given mobile LV thrombus and h/o CVA. I worry about his compliance.  3. sVT - 19 beats today - we will start BB once more compensated, BB not ideal given low flow - not candidate for ICD given non-compliance and polysubstance abuse  4. Polysubstance abuse - UDS positive for cocaine  4. 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 There is associated slow/absent flow within the associated right M2 branch. - No lasting deficit.  - He is on ASA and stain alone with poor prognosis. He has not been taking statin. - Likely cardio-embolic. Ideally would be on warfarin but not compliant enough at this point. Reconsider in future if compliance improves.  For questions or updates, please contact [facility] Please consult [email or web address] for contact info under     Signed, Tobias Alexander, MD  12/27/2018, 8:33 AM

## 2018-12-27 NOTE — Plan of Care (Signed)

## 2018-12-28 DIAGNOSIS — R0600 Dyspnea, unspecified: Secondary | ICD-10-CM

## 2018-12-28 DIAGNOSIS — Z7189 Other specified counseling: Secondary | ICD-10-CM

## 2018-12-28 DIAGNOSIS — Z515 Encounter for palliative care: Secondary | ICD-10-CM

## 2018-12-28 DIAGNOSIS — E871 Hypo-osmolality and hyponatremia: Secondary | ICD-10-CM

## 2018-12-28 DIAGNOSIS — F1490 Cocaine use, unspecified, uncomplicated: Secondary | ICD-10-CM

## 2018-12-28 DIAGNOSIS — Z720 Tobacco use: Secondary | ICD-10-CM

## 2018-12-28 DIAGNOSIS — R0602 Shortness of breath: Secondary | ICD-10-CM

## 2018-12-28 DIAGNOSIS — R740 Nonspecific elevation of levels of transaminase and lactic acid dehydrogenase [LDH]: Secondary | ICD-10-CM

## 2018-12-28 DIAGNOSIS — R570 Cardiogenic shock: Secondary | ICD-10-CM

## 2018-12-28 LAB — COMPREHENSIVE METABOLIC PANEL
ALT: 23 U/L (ref 0–44)
AST: 36 U/L (ref 15–41)
Albumin: 2.6 g/dL — ABNORMAL LOW (ref 3.5–5.0)
Alkaline Phosphatase: 92 U/L (ref 38–126)
Anion gap: 11 (ref 5–15)
BUN: 29 mg/dL — ABNORMAL HIGH (ref 6–20)
CO2: 34 mmol/L — ABNORMAL HIGH (ref 22–32)
Calcium: 8.4 mg/dL — ABNORMAL LOW (ref 8.9–10.3)
Chloride: 86 mmol/L — ABNORMAL LOW (ref 98–111)
Creatinine, Ser: 1.2 mg/dL (ref 0.61–1.24)
GFR calc Af Amer: >60 mL/min (ref >60)
GFR calc non Af Amer: >60 mL/min (ref >60)
Glucose, Bld: 90 mg/dL (ref 70–99)
Potassium: 3.8 mmol/L (ref 3.5–5.1)
Sodium: 131 mmol/L — ABNORMAL LOW (ref 135–145)
Total Bilirubin: 1.4 mg/dL — ABNORMAL HIGH (ref 0.3–1.2)
Total Protein: 6.7 g/dL (ref 6.5–8.1)

## 2018-12-28 LAB — CBC WITH DIFFERENTIAL/PLATELET
Abs Immature Granulocytes: 0.05 10*3/uL (ref 0.00–0.07)
Basophils Absolute: 0 10*3/uL (ref 0.0–0.1)
Basophils Relative: 0 %
Eosinophils Absolute: 0.1 10*3/uL (ref 0.0–0.5)
Eosinophils Relative: 1 %
HCT: 35.7 % — ABNORMAL LOW (ref 39.0–52.0)
Hemoglobin: 11.6 g/dL — ABNORMAL LOW (ref 13.0–17.0)
Immature Granulocytes: 0 %
Lymphocytes Relative: 15 %
Lymphs Abs: 2 10*3/uL (ref 0.7–4.0)
MCH: 26 pg (ref 26.0–34.0)
MCHC: 32.5 g/dL (ref 30.0–36.0)
MCV: 80 fL (ref 80.0–100.0)
Monocytes Absolute: 1.8 10*3/uL — ABNORMAL HIGH (ref 0.1–1.0)
Monocytes Relative: 14 %
Neutro Abs: 9.2 10*3/uL — ABNORMAL HIGH (ref 1.7–7.7)
Neutrophils Relative %: 70 %
Platelets: 155 10*3/uL (ref 150–400)
RBC: 4.46 MIL/uL (ref 4.22–5.81)
RDW: 18.1 % — ABNORMAL HIGH (ref 11.5–15.5)
WBC: 13.2 10*3/uL — ABNORMAL HIGH (ref 4.0–10.5)
nRBC: 0.2 % (ref 0.0–0.2)

## 2018-12-28 LAB — PROTIME-INR
INR: 2.5 — ABNORMAL HIGH (ref 0.8–1.2)
Prothrombin Time: 26.5 seconds — ABNORMAL HIGH (ref 11.4–15.2)

## 2018-12-28 LAB — PHOSPHORUS: Phosphorus: 3.6 mg/dL (ref 2.5–4.6)

## 2018-12-28 LAB — MAGNESIUM: Magnesium: 1.9 mg/dL (ref 1.7–2.4)

## 2018-12-28 LAB — GLUCOSE, CAPILLARY: Glucose-Capillary: 90 mg/dL (ref 70–99)

## 2018-12-28 LAB — HEPARIN LEVEL (UNFRACTIONATED): Heparin Unfractionated: 0.43 IU/mL (ref 0.30–0.70)

## 2018-12-28 LAB — LACTIC ACID, PLASMA: Lactic Acid, Venous: 1.4 mmol/L (ref 0.5–1.9)

## 2018-12-28 LAB — BRAIN NATRIURETIC PEPTIDE: B Natriuretic Peptide: 848.5 pg/mL — ABNORMAL HIGH (ref 0.0–100.0)

## 2018-12-28 MED ORDER — FUROSEMIDE 10 MG/ML IJ SOLN
40.0000 mg | Freq: Two times a day (BID) | INTRAMUSCULAR | Status: AC
  Administered 2018-12-28 – 2018-12-30 (×4): 40 mg via INTRAVENOUS
  Filled 2018-12-28 (×4): qty 4

## 2018-12-28 MED ORDER — SODIUM CHLORIDE 0.9 % IV BOLUS
250.0000 mL | Freq: Once | INTRAVENOUS | Status: AC
  Administered 2018-12-28: 250 mL via INTRAVENOUS

## 2018-12-28 MED ORDER — WARFARIN SODIUM 1 MG PO TABS
1.0000 mg | ORAL_TABLET | Freq: Once | ORAL | Status: AC
  Administered 2018-12-28: 1 mg via ORAL
  Filled 2018-12-28: qty 1

## 2018-12-28 MED ORDER — SODIUM CHLORIDE 0.9 % IV BOLUS: 250.0000 mL | Freq: Once | INTRAVENOUS | Status: DC

## 2018-12-28 MED ORDER — GUAIFENESIN ER 600 MG PO TB12
1200.0000 mg | ORAL_TABLET | Freq: Two times a day (BID) | ORAL | Status: DC
  Administered 2018-12-28: 1200 mg via ORAL
  Filled 2018-12-28 (×4): qty 2

## 2018-12-28 MED ORDER — CLINDAMYCIN HCL 300 MG PO CAPS
450.0000 mg | ORAL_CAPSULE | Freq: Three times a day (TID) | ORAL | Status: DC
  Administered 2018-12-28 – 2018-12-30 (×5): 450 mg via ORAL
  Filled 2018-12-28 (×6): qty 1

## 2018-12-28 NOTE — Progress Notes (Signed)
ANTICOAGULATION CONSULT NOTE - Initial Consult  Pharmacy Consult for warfarin  Indication: LV thrombus  Allergies  Allergen Reactions  . Hydrocodone Hives  . Lisinopril Swelling and Other (See Comments)    Facial swelling/angioedema  . Prednisone Shortness Of Breath, Nausea Only, Swelling and Other (See Comments)    Also made chest feel tight and genital area, legs, and face became swollen badly  . Penicillins Hives and Swelling     Has patient had a PCN reaction causing immediate rash, facial/tongue/throat swelling, SOB or lightheadedness with hypotension: Yes Has patient had a PCN reaction causing severe rash involving mucus membranes or skin necrosis: No Has patient had a PCN reaction that required hospitalization: No Has patient had a PCN reaction occurring within the last 10 years: No If all of the above answers are "NO", then may proceed with Cephalosporin use.     Patient Measurements: Height: 5\' 11"  (180.3 cm) Weight: 158 lb 11.7 oz (72 kg) IBW/kg (Calculated) : 75.3 Heparin Dosing Weight = TBW = 72 kg  Vital Signs: Temp: 97.8 F (36.6 C) (05/08 0800) Temp Source: Oral (05/08 0800) BP: 81/62 (05/08 1430)  Labs: Recent Labs    12/25/18 1712  12/26/18 0417 12/26/18 0924  12/26/18 2215 12/27/18 0526 12/28/18 0255 12/28/18 0512  HGB  --    < > 12.3*  --   --   --  12.6* 11.6*  --   HCT  --   --  37.6*  --   --   --  39.0 35.7*  --   PLT  --   --  172  --   --   --  153 155  --   APTT 29  --   --   --   --   --   --   --   --   LABPROT 18.6*  --  21.9*  --   --   --  25.0* 26.5*  --   INR 1.6*  --  1.9*  --   --   --  2.3* 2.5*  --   HEPARINUNFRC  --    < >  --  0.80*   < > 0.54 0.61  --  0.43  CREATININE  --    < > 1.26*  --   --   --  1.15 1.20  --   TROPONINI  --   --  0.07* 0.06*  --   --   --   --   --    < > = values in this interval not displayed.    Estimated Creatinine Clearance: 80 mL/min (by C-G formula based on SCr of 1.2 mg/dL).   Medical  History: Past Medical History:  Diagnosis Date  . Asthma   . Chronic systolic CHF (congestive heart failure) (HCC)   . Cigarette smoker   . CKD (chronic kidney disease), stage II    [name] 10/01/2017  . History of echocardiogram    a. Echo 5/17 - EF 20-25%, severe diff HK, restrictive physiology, mild to mod MR, severe reduced RVSF, mod RVE, mild RAE, mod TR, PASP 48 mmHg  . Hx of cardiac cath    a. LHC 5/17 - normal coronary arteries. PA 45/25, mean 33, PCWP mean 18  . NICM (nonischemic cardiomyopathy) (HCC)   . Stroke (HCC) 09/27/2017   "was weak on my left side; I'm fully recovered" (11/09/2017)  . Substance abuse (HCC)    cocaine, marijuana   Assessment: Patient is a 43  year old male admitted with SOB. PMH significant for HF, CVA in 2019, asthma, and substance abuse. Pt has history of medication non-compliance. Cardiology following. Diagnosed with LV thrombus and being started on warfarin with heparin bridge. Pt was not on anticoagulant prior to admission.  Baseline labs: -Hgb 13.4 -Plt 174 -INR: 1.6, elevated at baseline -aPTT: 29  Today, 12/28/18  Hgb 11.6 - slightly decreased  Plt 155 - WNL, stable  INR 2.5 remains therapeutic  DHL = 0.43 is therapeutic on heparin infusion of 1200 units/hr  Confirmed with RN that heparin running at correct rate, no issues with infusion, and no signs/symptoms of bleeding or bruising.   Heparin drip discontinued 5/8 AM per MD  No significant DDI with warfarin. Of note, patient is on ASA 81 mg PO daily.  Goal of Therapy:  INR 2-3 Monitor platelets by anticoagulation protocol: Yes   Plan:   Warfarin 1 mg PO once this evening   INR daily  CBC with AM labs tomorrow  Monitor for signs/symptoms of bleeding or thrombosis  Patient will need warfarin education and counseling prior to discharge   Cindi Carbon, PharmD 12/28/2018,3:05 PM

## 2018-12-28 NOTE — Progress Notes (Signed)
Notified Dr Marland Mcalpine @ [phone] that pt was hypotensive BP 77/64 (67), manual BP 76/60 and repeat automatic 87/67 (72).  Pt drowsy but mentating. To hold all AM BP meds until cardiology evaluates pt.  Goal for MAP>65.  To change pt to ICU status.

## 2018-12-28 NOTE — Consult Note (Signed)
Palliative Care Consult Note  Reason for consult: Goals of care in light of advanced biventricular heart failure  Palliative medicine consult received.  Chart reviewed including personal review of pertinent labs and imaging.  Discussed with bedside RN and Dr. Sheikh.  I met today with Mark [name].  I introduced palliative care as specialized medical care for people living with serious illness. It focuses on providing relief from the symptoms and stress of a serious illness. The goal is to improve quality of life for both the patient and the family.  Values and goals of care important to patient and family were attempted to be elicited.  We discussed clinical course as well as wishes moving forward in regard to advanced directives.  Concepts specific to code status and care plan this hospitalization discussed.  We discussed difference between a aggressive medical intervention path and a palliative, comfort focused care path.    - Mark [name] does not appear to accept how ill he is.  He is able to verbalize that he has been told "things are bad" but minimizes the severity of his condition and reports "I'll be fine if I can just get back on my home medications.  I only came in because I couldn't get a refill so things got worse."  Much of conversation today was regarding his difficulties maintaining his health as an outpatient.  Reports that he had medicaid in the past, but has not been able to reenroll after he was released from prison. - He states that his roommate, [name], knows him best and would be the person he would choose to make decisions on his behalf.  His parents are deceased and at this time, it appears that his sister would be his legal next of kin/surrogate. - At this time, he remains a full code.  Will continue to attempt to progress conversation.  He did states that he would not "want to be hooked up to a machine forever with a bunch of tubes in me if you try for awhile and I am not gonna  get better." - PMT to follow up later this weekend and continue conversation as he is emotionally able to do so.  Total time: 80 minutes Greater than 50%  of this time was spent counseling and coordinating care related to the above assessment and plan.  Questions and concerns addressed.   PMT will continue to support holistically.  Gene Freeman, MD [place] [facility] [phone]  

## 2018-12-28 NOTE — Progress Notes (Signed)
Spoke with Dr Marland Mcalpine, notified that pt refused both guafenesin and TED hose after education.  No new orders at this time.

## 2018-12-28 NOTE — Progress Notes (Signed)
Notified Dr Marland Mcalpine that pt was still hypotensive, BP 73/59 (62) - resting but asymptomatic.  MD to enter order for IVF bolus.

## 2018-12-28 NOTE — Progress Notes (Signed)
Progress Note  Patient Name: Mark [name] Date of Encounter: 12/28/2018  Primary Cardiologist: Verne Carrow, MD   Subjective   The patient is tired, mild SOB.  Inpatient Medications    Scheduled Meds: . aspirin EC  81 mg Oral Daily  . atorvastatin  40 mg Oral q1800  . Chlorhexidine Gluconate Cloth  6 each Topical Daily  . digoxin  0.125 mg Oral Daily  . furosemide  40 mg Intravenous Q6H  . guaiFENesin  1,200 mg Oral BID  . mometasone-formoterol  2 puff Inhalation BID  . nicotine  14 mg Transdermal Daily  . Warfarin - Pharmacist Dosing Inpatient   Does not apply q1800   Continuous Infusions: . clindamycin (CLEOCIN) IV Stopped (12/28/18 0355)   PRN Meds: acetaminophen **OR** acetaminophen, albuterol, diphenhydrAMINE   Vital Signs    Vitals:   12/28/18 0800 12/28/18 0900 12/28/18 0924 12/28/18 1000  BP: (!) 85/67  93/72 (!) 87/66  Pulse:      Resp: 20 17 20 17   Temp: 97.8 F (36.6 C)     TempSrc: Oral     SpO2: 96% 94% 95% 96%  Weight:    72 kg  Height:        Intake/Output Summary (Last 24 hours) at 12/28/2018 1114 Last data filed at 12/28/2018 0900 Gross per 24 hour  Intake 2206.37 ml  Output 3370 ml  Net -1163.63 ml   Last 3 Weights 12/28/2018 12/27/2018 12/26/2018  Weight (lbs) 158 lb 11.7 oz 152 lb 8.9 oz 164 lb 3.9 oz  Weight (kg) 72 kg 69.2 kg 74.5 kg     Telemetry    SR, runs of nsVT up to 11 beats - Personally Reviewed  ECG    No new - Personally Reviewed  Physical Exam   GEN: No acute distress.   Neck: +2 cm JVD Cardiac: RRR, 4/6 systolic murmurs, + S4, or gallops.  Respiratory: crackles to auscultation bilaterally. GI: Soft, nontender, non-distended  MS: + 3 edema B/L up to knees; No deformity. Neuro:  Nonfocal  Psych: Normal affect   Labs    Chemistry Recent Labs  Lab 12/26/18 0417 12/27/18 0526 12/28/18 0255  NA 133* 131* 131*  K 3.9 3.6 3.8  CL 95* 89* 86*  CO2 27 31 34*  GLUCOSE 123* 124* 90  BUN 37* 29* 29*   CREATININE 1.26* 1.15 1.20  CALCIUM 8.5* 8.6* 8.4*  PROT 6.5 6.8 6.7  ALBUMIN 2.8* 2.7* 2.6*  AST 44* 37 36  ALT 25 25 23   ALKPHOS 91 91 92  BILITOT 2.5* 2.2* 1.4*  GFRNONAA >60 >60 >60  GFRAA >60 >60 >60  ANIONGAP 11 11 11     Hematology Recent Labs  Lab 12/26/18 0417 12/27/18 0526 12/28/18 0255  WBC 21.7* 14.1* 13.2*  RBC 4.67 4.81  4.81 4.46  HGB 12.3* 12.6* 11.6*  HCT 37.6* 39.0 35.7*  MCV 80.5 81.1 80.0  MCH 26.3 26.2 26.0  MCHC 32.7 32.3 32.5  RDW 18.1* 18.5* 18.1*  PLT 172 153 155   Cardiac Enzymes Recent Labs  Lab 12/24/18 2237 12/26/18 0417 12/26/18 0924  TROPONINI 0.08* 0.07* 0.06*   No results for input(s): TROPIPOC in the last 168 hours.   BNP Recent Labs  Lab 12/24/18 2237 12/28/18 0251  BNP 3,973.6* 848.5*    DDimer No results for input(s): DDIMER in the last 168 hours.   Radiology    Dg Chest Port 1 View  Result Date: 12/27/2018 CLINICAL DATA:  Shortness of breath.  EXAM: PORTABLE CHEST 1 VIEW COMPARISON:  Chest x-ray dated Dec 24, 2018. FINDINGS: Stable mild cardiomegaly. Normal mediastinal contours. Normal pulmonary vascularity. No focal consolidation, pleural effusion, or pneumothorax. No acute osseous abnormality. IMPRESSION: No active disease. Electronically Signed   By: Obie Dredge M.D.   On: 12/27/2018 08:00   Ct Maxillofacial Wo Contrast  Result Date: 12/26/2018 CLINICAL DATA:  Facial pain and swelling EXAM: CT MAXILLOFACIAL WITHOUT CONTRAST TECHNIQUE: Multidetector CT imaging of the maxillofacial structures was performed. Multiplanar CT image reconstructions were also generated. COMPARISON:  None. FINDINGS: Osseous: No acute fracture is identified. Diffuse dental caries are identified. No periapical abscess is noted. Orbits: Orbits are within normal limits. Sinuses: Paranasal sinuses demonstrate mucosal thickening bilaterally within the maxillary antra. Air-fluid level is noted within the left maxillary antrum consistent with acute  sinusitis. Soft tissues: Soft tissue swelling is noted in the left face surrounding the maxilla and mandible. No focal hematoma is noted. Some subcutaneous air is noted along the anterior aspect of the maxilla. This likely represents some localized infection. Limited intracranial: Within normal limits. IMPRESSION: No acute bony abnormality is noted. Soft tissue swelling is seen with some subcutaneous air consistent with focal inflammatory change. Multiple dental caries are noted. Inflammatory changes in the paranasal sinuses with air-fluid level in the left maxillary antrum consistent with acute sinusitis. Electronically Signed   By: Alcide Clever M.D.   On: 12/26/2018 21:35   Cardiac Studies   TTE; 12/25/18 Moderate LV dilatation and severe systolic dysfunction with LVEF 5-10% and diffuse hypokinesis. 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 Grade 2 diastolic dysfunction with severely elevated filling pressures.   Right ventricle is severely dilated and now with severe systolic dysfunction (previously mild to moderate). RVSP is 42 mmHg consistent with moderate pulmonary hypertension, however underestimated in teh settings of severe RV dysfunction. RVSP previously 56 mmHg.   Moderate to severe mitral and severe tricuspid regurgitation.   Patient Profile     43 y.o. male   Assessment & Plan    1. Acute on Chronic systolic CHF, Echo 09/2017 LVEF 5-10%, severe RV systolic dysfunction - NICM by cath 2017.Suspect etiology is polysubstance abuse - NYHAIV, the patient is hypotensive in cardiogenic shock with severe biventricular failure, his diuretics have been sec to hypotension, his weight is going back up, given long term non-compliance, ongoing cocaine use the patient is not a candidate for any advanced therapies such as inotrope infusion or  LVAD/transplant down the road. - palliative care should be consulted to determine goals of care - I will decrease lasix to 40 mg iv  BID, hold spironolactone  - BNP 3973 - Crea 1.28->1.26->1.15 - baseline weight 147 lbs, on admission 161, now down to 152 lbs - he has not been taking any meds at home  2. LV thrombus - h/o CVA - we started coumadin given mobile LV thrombus and h/o CVA. If he become a palliative care patient this should be discontinued.  3. sVT - 11 beats today -unable to add BB sec to hypotension - not candidate for ICD given non-compliance and polysubstance abuse  4. Polysubstance abuse - UDS positive for cocaine  4. 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 There is associated slow/absent flow within the associated right M2 branch. - No lasting deficit.  - He is on ASA and stain alone with poor prognosis. He has not been taking statin. -  Likely cardio-embolic. Ideally would be on warfarin but not compliant enough at this point. Reconsider in future if compliance improves.  For questions or updates, please contact [facility] Please consult [email or web address] for contact info under     Signed, Tobias Alexander, MD  12/28/2018, 11:14 AM

## 2018-12-28 NOTE — Progress Notes (Signed)
Progress Note  Patient Name: Mark [name] Date of Encounter: 12/28/2018  Primary Cardiologist: Verne Carrow, MD   Subjective   He seems obtunded,   Inpatient Medications    Scheduled Meds: . aspirin EC  81 mg Oral Daily  . atorvastatin  40 mg Oral q1800  . Chlorhexidine Gluconate Cloth  6 each Topical Daily  . digoxin  0.125 mg Oral Daily  . furosemide  40 mg Intravenous Q6H  . guaiFENesin  1,200 mg Oral BID  . mometasone-formoterol  2 puff Inhalation BID  . nicotine  14 mg Transdermal Daily  . spironolactone  25 mg Oral Daily  . Warfarin - Pharmacist Dosing Inpatient   Does not apply q1800   Continuous Infusions: . clindamycin (CLEOCIN) IV Stopped (12/28/18 4008)   PRN Meds: acetaminophen **OR** acetaminophen, albuterol, diphenhydrAMINE   Vital Signs    Vitals:   12/28/18 0800 12/28/18 0900 12/28/18 0924 12/28/18 1000  BP: (!) 85/67  93/72 (!) 87/66  Pulse:      Resp: 20 17 20 17   Temp: 97.8 F (36.6 C)     TempSrc: Oral     SpO2: 96% 94% 95% 96%  Weight:      Height:        Intake/Output Summary (Last 24 hours) at 12/28/2018 1032 Last data filed at 12/28/2018 0900 Gross per 24 hour  Intake 2206.37 ml  Output 3370 ml  Net -1163.63 ml   Last 3 Weights 12/27/2018 12/26/2018 12/25/2018  Weight (lbs) 152 lb 8.9 oz 164 lb 3.9 oz 159 lb 2.8 oz  Weight (kg) 69.2 kg 74.5 kg 72.2 kg     Telemetry    SR, runs of nsVT up to 9 beats - Personally Reviewed  ECG    No new - Personally Reviewed  Physical Exam   GEN: No acute distress.   Neck: +2 cm JVD Cardiac: RRR, 4/6 systolic murmurs, + S4, or gallops.  Respiratory: crackles to auscultation bilaterally. GI: Soft, nontender, non-distended  MS: + 3 edema B/L up to knees; No deformity. Neuro:  Nonfocal  Psych: Normal affect   Labs    Chemistry Recent Labs  Lab 12/26/18 0417 12/27/18 0526 12/28/18 0255  NA 133* 131* 131*  K 3.9 3.6 3.8  CL 95* 89* 86*  CO2 27 31 34*  GLUCOSE 123* 124* 90   BUN 37* 29* 29*  CREATININE 1.26* 1.15 1.20  CALCIUM 8.5* 8.6* 8.4*  PROT 6.5 6.8 6.7  ALBUMIN 2.8* 2.7* 2.6*  AST 44* 37 36  ALT 25 25 23   ALKPHOS 91 91 92  BILITOT 2.5* 2.2* 1.4*  GFRNONAA >60 >60 >60  GFRAA >60 >60 >60  ANIONGAP 11 11 11     Hematology Recent Labs  Lab 12/26/18 0417 12/27/18 0526 12/28/18 0255  WBC 21.7* 14.1* 13.2*  RBC 4.67 4.81  4.81 4.46  HGB 12.3* 12.6* 11.6*  HCT 37.6* 39.0 35.7*  MCV 80.5 81.1 80.0  MCH 26.3 26.2 26.0  MCHC 32.7 32.3 32.5  RDW 18.1* 18.5* 18.1*  PLT 172 153 155   Cardiac Enzymes Recent Labs  Lab 12/24/18 2237 12/26/18 0417 12/26/18 0924  TROPONINI 0.08* 0.07* 0.06*   No results for input(s): TROPIPOC in the last 168 hours.   BNP Recent Labs  Lab 12/24/18 2237 12/28/18 0251  BNP 3,973.6* 848.5*    DDimer No results for input(s): DDIMER in the last 168 hours.   Radiology    Dg Chest Port 1 View  Result Date: 12/27/2018 CLINICAL  DATA:  Shortness of breath. EXAM: PORTABLE CHEST 1 VIEW COMPARISON:  Chest x-ray dated Dec 24, 2018. FINDINGS: Stable mild cardiomegaly. Normal mediastinal contours. Normal pulmonary vascularity. No focal consolidation, pleural effusion, or pneumothorax. No acute osseous abnormality. IMPRESSION: No active disease. Electronically Signed   By: Obie Dredge M.D.   On: 12/27/2018 08:00   Ct Maxillofacial Wo Contrast  Result Date: 12/26/2018 CLINICAL DATA:  Facial pain and swelling EXAM: CT MAXILLOFACIAL WITHOUT CONTRAST TECHNIQUE: Multidetector CT imaging of the maxillofacial structures was performed. Multiplanar CT image reconstructions were also generated. COMPARISON:  None. FINDINGS: Osseous: No acute fracture is identified. Diffuse dental caries are identified. No periapical abscess is noted. Orbits: Orbits are within normal limits. Sinuses: Paranasal sinuses demonstrate mucosal thickening bilaterally within the maxillary antra. Air-fluid level is noted within the left maxillary antrum consistent  with acute sinusitis. Soft tissues: Soft tissue swelling is noted in the left face surrounding the maxilla and mandible. No focal hematoma is noted. Some subcutaneous air is noted along the anterior aspect of the maxilla. This likely represents some localized infection. Limited intracranial: Within normal limits. IMPRESSION: No acute bony abnormality is noted. Soft tissue swelling is seen with some subcutaneous air consistent with focal inflammatory change. Multiple dental caries are noted. Inflammatory changes in the paranasal sinuses with air-fluid level in the left maxillary antrum consistent with acute sinusitis. Electronically Signed   By: Alcide Clever M.D.   On: 12/26/2018 21:35   Cardiac Studies   TTE; 12/25/18 Moderate LV dilatation and severe systolic dysfunction with LVEF 5-10% and diffuse hypokinesis. 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 Grade 2 diastolic dysfunction with severely elevated filling pressures.   Right ventricle is severely dilated and now with severe systolic dysfunction (previously mild to moderate). RVSP is 42 mmHg consistent with moderate pulmonary hypertension, however underestimated in teh settings of severe RV dysfunction. RVSP previously 56 mmHg.   Moderate to severe mitral and severe tricuspid regurgitation.   Patient Profile     43 y.o. male   Assessment & Plan    1. Acute on Chronic systolic CHF, Echo 09/2017 LVEF 5-10%, severe RV systolic dysfunction - NICM by cath 2017.Suspect etiology is polysubstance abuse - NYHAIII, - BNP 3973 - Crea 1.28->1.26->1.15->1.2 - baseline weight 147 lbs, on admission 161, now down to 152 lbs - he has not been taking any meds at home - restarted on ASA, atorvastatin, digoxin, spironolactone, losartan - continue lasix to 40 mg iv Q6H, I will add metolazone 2.5 mg po x 1 again today - he is still volume overloaded - Not a candidate for advanced therapies down the road and need to remain off  substances  2. LV thrombus - h/o CVA - we will start coumadin given mobile LV thrombus and h/o CVA. I worry about his compliance.  3. sVT - 19 beats today - we will start BB once more compensated, BB not ideal given low flow - not candidate for ICD given non-compliance and polysubstance abuse  4. Polysubstance abuse - UDS positive for cocaine  4. 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 There is associated slow/absent flow within the associated right M2 branch. - No lasting deficit.  - He is on ASA and stain alone with poor prognosis. He has not been taking statin. - Likely cardio-embolic. Ideally would be on warfarin but not compliant enough at this point. Reconsider in future if compliance  improves.  For questions or updates, please contact [facility] Please consult [email or web address] for contact info under     Signed, Tobias Alexander, MD  12/28/2018, 10:32 AM

## 2018-12-28 NOTE — Progress Notes (Signed)
PROGRESS NOTE    Mark [name]  ZOX:[id number] DOB: Sep 24, 1975 DOA: 12/24/2018 PCP: Massie Maroon, FNP  Brief Narrative:  HPI per Dr. Darreld Mclean on 12/25/2018 Mark [name] is a 43 y.o. male with medical history significant for chronic systolic CHF (EF 81-19% with severe diffuse hypokinesis by TTE 09/28/2017), NICM by cardiac cath 2017, history of right MCA CVA 2019, asthma, and substance use who presents to the ED for progressive shortness of breath.  Patient states he is running low on his home Lasix which he has been cutting in half in order to extend the duration of his medications.  He ran out completely about 4 days ago.  He has had progressive swelling of both of his legs as well as shortness of breath and orthopnea.  He denies any chest pain, cough, fever, dysuria.  He for smoking with 1 pack of cigarettes lasting about 3-4 days.  He denies a significant alcohol use.  He admits to occasional cocaine use and says last use was about 2 weeks ago.  ED Course:  Initial vitals showed BP 114/94, pulse 110, RR 25, temp 97.6 Fahrenheit, SPO2 100% on nonrebreather.  Labs notable for WBC 6.7, hemoglobin 12.9, platelets 189, potassium 4.3, BUN 31, creatinine 1.15, troponin I 0.08, BNP 3973.6, digoxin level 0.5, SARS-CoV-2 testing negative.  Portable chest x-ray showed enlarged cardiac silhouette without focal consolidation, pleural effusion or interstitial edema.  Patient was given IV Lasix 40 mg x 1 and placed on BiPAP with improvement in respiratory status.  The hospital service was consulted to admit for further evaluation and management.  **Interim History The patient was weaned off of BiPAP and cardiology consulted and patient is currently being diuresed.  Patient is also been started on a heparin drip with a warfarin bridge for his left ventricular thrombus.  Per cardiology he is not a candidate for ICD given his noncompliance and polysubstance abuse and they are worried about him  being on Coumadin given his noncompliance will continue at this time and per them is not a candidate for advanced therapies but may be if he remains off substances.  Assessment & Plan:   Principal Problem:   Acute on chronic systolic congestive heart failure (HCC) Active Problems:   Asthma   Elevated troponin I level   History of right MCA stroke   Demand ischemia (HCC)   LV (left ventricular) mural thrombus without MI   SOB (shortness of breath)   Cocaine use  Acute Respiratory Failure with Hypoxia in the setting of acute CHF exacerbation requiring noninvasive positive pressure ventilation with BiPAP, improved -Improved but was still complaining of some SOB -Patient was placed on BiPAP initially and now been transitioned off -Currently saturating on room air; SARS-CoV-2 Negative  -Repeat CXR yesterday AM showed "Stable mild cardiomegaly. Normal mediastinal contours. Normal pulmonary vascularity. No focal consolidation, pleural effusion, or pneumothorax. No acute osseous abnormality." -Continue Diuresis per cardiology as below -Repeat CXR in AM   Acute on Chronic Combined Systolic and Diastolic CHF exacerbation with recent echo 5 to 10% and severe right ventricular systolic dysfunction and Grade 2 DD now complicated by Cardiogenic Shock  -EF 10-15% by echocardiogram February 2019, NICM by cardiac cath 2017.   -BNP on Admission was 3,973.6 and has improved to 848.5 -Patient had significant volume overload on exam with improvement in respiratory symptoms on BiPAP. -Admit to stepdown unit for as needed BiPAP and now of off of it but now upgraded to ICU Status because of  Frequent Monitoring  -IV Lasix 40 mg twice daily changed to q6h by Cardiology but this was cut back to BID given Hypotension  -Losartan 25 mg po qHS and Spironolactone 25 mg po Daily stopped due to Hypotension -C/w Digoxin 0.125 mg po Daily -Strict I's/O's and Daily Weights; Patient's weight is fluctuating and Up 6 lbs  since yesterday and is now 148 -Baseline Weight is 147 lbs per Cardiology  -He is -5,325 mL since admission -Will place on Heart Healthy Diet and Fluid Restrict to 1500 mL -Repeat Echocardiogram this visit showed moderate LV dilatation and severe systolic dysfunction with LVEF of 5 to 10% and diffuse hypokinesis.  There is also new large apical thrombus measuring 2.7 x 1.6 cm and grade 2 diastolic dysfunction with severely elevated filling pressures -Cardiology giving Metolazone 2.5 mg po x1 yesterday but not today given Hypotension -Had to give a small 250 mL bolus over 4 Hours to keep MAP above 65 -Dr. Delton See currently not a candidate for advanced therapies and no indication for ICD now as he has Significant Substance Abuse -Dr. Delton See evaluated and per her note and he has a NYHA class IV and says the patient was hypotensive today she felt he was in cardiogenic shock with severe biventricular failure and diuretics were held this morning secondary to hypotension but they have been reduced to twice daily dosing.  States weight is going up and given his long-term noncompliance and ongoing cocaine use she states the patient not a candidate for advanced therapies such as inotrope infusion or LVAD and transplant down the road and recommends against milrinone drip -She recommends palliative care consult for goals of care discussion I discussed with Dr. Neale Burly who will see the patient in consultation today -Appreciate Dr. Neale Burly seen the patient and await further continued goals of care discussions.  Elevated Troponin Level -Mild troponin elevation of 0.08, likely due to demand ischemia.  -He has chronically elevated troponin on review of labs.   -We will continue to trend and trending down as went from 0.08 -> 0.07 -> 0.06 -Continue to Monitor for Chest Pain   History of Right MCA Stroke -No residual deficit. -Had an MRI of Brain on 09/28/2017 which showed moderate to large right MCA distribution  infarct. There was also associated slow/absent flow within the associated right M2 branch -Suspected to be cardioembolic, however he was not started on anticoagulation due to his history of nonadherence to medication as an outpatient -He has been on Aspirin 81 mg daily.  He was on statin in the past but does not appear to be taking it. -Continue Aspirin 81 mg Daily -Start Atorvastatin 40 mg Daily -Per Cardiology Ideally needs to be on Warfarin but not compliant enough per them but since he was started on admission, will continue it and Cardiology states they will follow patient as an outpatient for his Coumadin   Asthma -Not obviously wheezing on examination.   -Respiratory symptoms more consistent with CHF exacerbation. -Continue Dulera 2 puff IH BID and Albuterol 2.5 mg Neb q4hprn Wheezing and SOB  Substance Use Disorder/Tobacco use: -Patient acknowledges he needs to quit tobacco. Smoking Cessation Counseling given again  -He admits to cocaine use with last use about 2 weeks ago. -C/w Nicotine Patch 14 mg TD q24h -Obtained UDS and was + for Cocaine   Lactic Acidosis -Improved. Lactic Acid Level went from 2.1 -> 1.4 -Will not continue to Trend   Hyponatremia -Patient's Na+ went from 136 -> 133 -> 131 and  was again 131 this AM  -In the Setting of Hypervolemic Hyponatremia but now he is Hypotensive so Diuresis is being reduced -C/w Diuresis per Cardiology -Repeat CMP in AM  Leukocytosis in the Setting of Dental Infection, improving -WBC went from 6.7 -> 9.0 -> 21.7 -> 14.1 -> 13.2 -? Reactive and in the setting of Diuresis but now likely source is mouth as patient has swelling and pain in Jaw and specifically left side of face -Patient is Currently Afebrile with a Tmax of 98.5 in the last 24 hours -Wanted to obtain a DG Orthopantogram but no longer available at [facility] -Instead ordered CT Maxillofacial and showed "No acute bony abnormality is noted. Soft tissue swelling is seen  with some subcutaneous air consistent with focal inflammatory change. Multiple dental caries are noted. Inflammatory changes in the paranasal sinuses with air-fluid level in the left maxillary antrum consistent with acute sinusitis." -Patient was started on Clindamycin IV given PCN Allergy and will transition to po Abx -Have attempted to reach our In-house Dentist Dr. Kristin Bruins for evaluation but no response after calling and will try again tomorrow  -Continue to Monitor for S/Sx of Infection -Repeat CBC in AM   Normocytic Anemia  -Patient's hemoglobin/hematocrit went from 12.9/41.1 -> 13.4/42.8 -> 12.8/37.6 -> 12.6/39.0 -> 11.6/35.7 -Continue monitor for signs and symptoms of bleeding as patient not anticoagulated with heparin drip as well as Coumadin -Checked Anemia Panel and showed iron level 14, U IBC of 287, TIBC of 301, saturation ratios of 5%, ferritin level 131, folate level 7.3, and vitamin B12 of 718 -Repeat CBC in a.m.  Hypophosphatemia -Patient's phosphorus levels morning was 2.3 and improved to 3.6 -Replete with p.o. K-Phos Neutral 500 mg x 1 yesterday  -Continue to monitor replete as necessary  Hypomagnesemia -Patient's Mag Level was 1.5 and improved to 1.9 -Replete with IV Mag Sulfate 2 grams -Continue to Monitor and Replete as Necessary -Repeat Mag Level in AM   Renal Insuffiencey -Patient's BUN/creatinine went from 31/1.15 ->  37/1.26 -> 29/1.15 -> 29/1.20  -In the setting of diuresis -Continue monitor and trend renal function -Avoid nephrotoxic medications if possible as well as contrast dyes and hypotension -Repeat CMP in AM   Left Ventricle Thrombus -Patient is now on Warfarin with Heparin gtt and Heparin gtt to be stopped toady  -INR was 2.5 this AM; Pharmacy adjusting -Per Pharmacy: "Patient will need anticoagulation bridge for at least the first 5 days of warfarin therapy and until two days of consecutive INR readings >2"; Patient has been on Heparin gtt for 4  days and has had 2 INR's >2 so will stop.   -Cardiology was concerned about his noncompliance but since he was started on Heparin gtt and Coumadin on Admission will continue and per discussion with CARDs PA (Could not get in touch with Dr. Delton See after several attempts yesterday) she discussed with Dr. Delton See who states to continue Coumadin and Cardiology will follow up at D/C and continue to Monitor INR's; Now Cardiology states that if he becomes a Palliative Patient after GOC then to D/C Coumadin  -Will need close outpatient Monitoring if this is continued and if patient becomes more compliant   Hyperglycemia in the setting of New Onset Diabetes -Patient's Blood Sugar has been ranging from 90-123 -Checked HbA1c and was 6.8; Last HbA1c was 6.4 -Continue to Monitor Blood Sugars Carefully -IF necessary will place on Sensitive Novolog SSI  Hyperbilirubinemia, improving  -Likely Reactive -T Bili was 2.4 on Admission and now  is 1.4 -Continue to Monitor and Trend -Repeat CMP in AM   Elevated AST -In the setting of Volume Overload -Trending Down as went from 55 -> 44 -> 37 -> 36 -Continue to Monitor and Trend Closely  -Repeat CMP in AM   DVT prophylaxis: Anticoagulated with Heparin gtt and Coumadin Code Status: FULL CODE Family Communication: No family present at bedside  Disposition Plan: Upgraded to ICU Status because of Cardiogenic Shock  Consultants:   Cardiology Dr. Delton See  Palliative Care Medicine Dr. Neale Burly    Procedures:  ECHOCARDIOGRAM IMPRESSIONS    1. The right ventricle has severely reduced systolic function. The cavity was severely enlarged. There is no increase in right ventricular wall thickness.  2. Left atrial size was mildly dilated.  3. Right atrial size was moderately dilated.  4. Severely thickened tricuspid valve leaflets.  5. The mitral valve is grossly normal. Mitral valve regurgitation is moderate to severe by color flow Doppler. The MR jet is  posteriorly-directed.  6. The tricuspid valve is grossly normal.  7. The pulmonic valve was grossly normal. Pulmonic valve regurgitation is moderate is mild by color flow Doppler.  SUMMARY   Moderate LV dilatation and severe systolic dysfunction with LVEF 5-10% and diffuse hypokinesis. 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 Grade 2 diastolic dysfunction with severely elevated filling pressures.   Right ventricle is severely dilated and now with severe systolic dysfunction (previously mild to moderate). RVSP is 42 mmHg consistent with moderate pulmonary hypertension, however underestimated in teh settings of severe RV dysfunction. RVSP previously 56 mmHg.   Moderate to severe mitral and severe tricuspid regurgitation.  FINDINGS  Left Ventricle: The left ventricle has severely reduced systolic function, wih an ejection fraction of 5-10%. The cavity size was normal. There is no increase in left ventricular wall thickness. Definity contrast agent was given IV to delineate the left  ventricular endocardial borders.  Right Ventricle: The right ventricle has severely reduced systolic function. The cavity was severely enlarged. There is no increase in right ventricular wall thickness.  Left Atrium: Left atrial size was mildly dilated.  Right Atrium: Right atrial size was moderately dilated. Right atrial pressure is estimated at 15 mmHg.  Interatrial Septum: No atrial level shunt detected by color flow Doppler.  Pericardium: There is no evidence of pericardial effusion.  Mitral Valve: The mitral valve is grossly normal. Mitral valve regurgitation is moderate to severe by color flow Doppler. The MR jet is posteriorly-directed.  Tricuspid Valve: The tricuspid valve is grossly normal. Tricuspid valve regurgitation was not visualized by color flow Doppler. The tricuspid valve is severely thickened.  Aortic Valve: The aortic valve is normal in structure. Aortic  valve regurgitation was not assessed by color flow Doppler.  Pulmonic Valve: The pulmonic valve was grossly normal. Pulmonic valve regurgitation is moderate is mild by color flow Doppler.  Venous: The inferior vena cava is normal in size with greater than 50% respiratory variability.    +--------------+--------++  LEFT VENTRICLE                 +----------------+---------++ +--------------+--------++       Diastology                    PLAX 2D                        +----------------+---------++ +--------------+--------++       LV e' lateral:   8.03  cm/s    LVIDd:         5.59 cm         +----------------+---------++ +--------------+--------++       LV E/e' lateral: 12.3         LVIDs:         5.32 cm         +----------------+---------++ +--------------+--------++       LV e' medial:    3.75 cm/s    LV PW:         0.96 cm         +----------------+---------++ +--------------+--------++       LV E/e' medial:  26.4         LV IVS:        0.90 cm         +----------------+---------++ +--------------+--------++  LVOT diam:     2.20 cm    +--------------+--------++  LV SV:         17 ml      +--------------+--------++  LV SV Index:   8.69       +--------------+--------++  LVOT Area:     3.80 cm   +--------------+--------++                            +--------------+--------++   +------------------+---------++  LV Volumes (MOD)               +------------------+---------++  LV area d, A2C:    59.90 cm   +------------------+---------++  LV area d, A4C:    53.00 cm   +------------------+---------++  LV area s, A2C:    51.80 cm   +------------------+---------++  LV area s, A4C:    47.70 cm   +------------------+---------++  LV major d, A2C:   10.80 cm    +------------------+---------++  LV major d, A4C:   10.40 cm    +------------------+---------++  LV major s, A2C:   9.93 cm     +------------------+---------++  LV major s, A4C:   9.73 cm      +------------------+---------++  LV vol d, MOD A2C: 275.0 ml    +------------------+---------++  LV vol d, MOD A4C: 222.0 ml    +------------------+---------++  LV vol s, MOD A2C: 230.0 ml    +------------------+---------++  LV vol s, MOD A4C: 194.0 ml    +------------------+---------++  LV SV MOD A2C:     45.0 ml     +------------------+---------++  LV SV MOD A4C:     222.0 ml    +------------------+---------++  LV SV MOD BP:      39.2 ml     +------------------+---------++  +---------------+---------++  RIGHT VENTRICLE             +---------------+---------++  RV S prime:     8.93 cm/s   +---------------+---------++  TAPSE (M-mode): 1.3 cm      +---------------+---------++  +-----------+-------++----------++  LEFT ATRIUM          Index        +-----------+-------++----------++  LA diam:    4.20 cm  2.19 cm/m   +-----------+-------++----------++  +------------+-----------++  AORTIC VALVE               +------------+-----------++  LVOT Vmax:   45.00 cm/s    +------------+-----------++  LVOT Vmean:  27.100 cm/s   +------------+-----------++  LVOT VTI:    0.048 m       +------------+-----------++   +-------------+-------++  AORTA                   +-------------+-------++  Ao Root diam: 3.00 cm   +-------------+-------++  Ao Asc diam:  2.70 cm   +-------------+-------++  +--------------+----------++ +---------------+-----------++  MITRAL VALVE                 TRICUSPID VALVE               +--------------+----------++ +---------------+-----------++  MV Area (PHT): 5.38 cm      TR Peak grad:   26.0 mmHg     +--------------+----------++ +---------------+-----------++  MV PHT:        40.89 msec    TR Vmax:        255.00 cm/s   +--------------+----------++ +---------------+-----------++  MV Decel Time: 141 msec     +--------------+----------++ +--------------+-------+ +--------------+----------++  SHUNTS                   MV E velocity: 99.00 cm/s    +--------------+-------+ +--------------+----------++  Systemic VTI:  0.05 m    MV A velocity: 46.30 cm/s   +--------------+-------+ +--------------+----------++  Systemic Diam: 2.20 cm   MV E/A ratio:  2.14         +--------------+-------+ +--------------+----------++   Antimicrobials: Anti-infectives (From admission, onward)   Start     Dose/Rate Route Frequency Ordered Stop   12/28/18 2200  clindamycin (CLEOCIN) capsule 450 mg     450 mg Oral Every 8 hours 12/28/18 1600     12/26/18 1800  clindamycin (CLEOCIN) IVPB 600 mg  Status:  Discontinued     600 mg 100 mL/hr over 30 Minutes Intravenous Every 8 hours 12/26/18 1709 12/28/18 1555     Subjective: Seen and examined at bedside with symptomatic last night with dizziness and shortness of breath.  This morning he was hypotensive and his diuretics and blood pressure medications were held likely because her cardiogenic shock.  Because his map will below 65 a small to 50 mL bolus was given.  He denies chest pain, lightheadedness or dizziness now but did state he would not feeling well and states his mouth is feeling little bit better today and was not as swollen.  Cardiology feels he is not a candidate for milrinone drip for his cardiogenic shock and has recommended palliative consultation and Dr. Neale Burly is to see the patient.  Patient has no other concerns or complaints at this time and I discussed with him about obtaining TED hose and starting guaifenesin and he was initially agreeable but then refused both of them.  Objective: Vitals:   12/28/18 1630 12/28/18 1700 12/28/18 1800 12/28/18 1830  BP: (!) 85/69 (!) 82/67 90/72   Pulse:      Resp: 20 (!) 22 (!) 46 19  Temp:      TempSrc:      SpO2: 100% 98% 96% 91%  Weight:      Height:        Intake/Output Summary (Last 24 hours) at 12/28/2018 1846 Last data filed at 12/28/2018 1800 Gross per 24 hour  Intake 1896.77 ml  Output 2370 ml  Net -473.23 ml   Filed Weights   12/26/18  0357 12/27/18 0421 12/28/18 1000  Weight: 74.5 kg 69.2 kg 72 kg   Examination: Physical Exam:  Constitutional: Well-nourished, well-developed African-American male currently no acute distress appears calm and states he is feeling better than last night as he did not rest well Eyes: Lids and conjunctive normal.  Sclera anicteric ENMT: External ears and nose appear normal.  Grossly normal hearing.  Mouth and face is still a little swollen but  not much as yesterday Neck: Appears supple minimal JVD Respiratory: Diminished auscultation bilaterally no appreciable wheezing, rales, car.  Patient did have some slight crackles and coarse breath sounds but had unlabored breathing is not wearing any oxygen  Cardiovascular: Tachycardic rate but regular rhythm.  Has a 3/6 systolic murmur.  Has 1-2+ lower extremity pitting edema again this is improving and ankles are still swollen though Abdomen: Soft, nontender, nondistended.  Bowel sounds present GU: Deferred Musculoskeletal: No contractures or cyanosis.  No joint deformities in the upper and lower extremities Skin: No appreciable rashes or lesions on to skin evaluation but patient does have tattoos diffusely scattered throughout his body Neurologic: Cranial nerves II through XII grossly did not appreciable focal deficits Psychiatric: Judgment and insight.  Patient is awake, alert, oriented x3.  Normal mood and affect  Data Reviewed: I have personally reviewed following labs and imaging studies  CBC: Recent Labs  Lab 12/24/18 2233 12/25/18 0641 12/26/18 0417 12/27/18 0526 12/28/18 0255  WBC 6.7 9.0 21.7* 14.1* 13.2*  NEUTROABS  --   --   --  12.1* 9.2*  HGB 12.9* 13.4 12.3* 12.6* 11.6*  HCT 41.1 42.8 37.6* 39.0 35.7*  MCV 83.0 81.5 80.5 81.1 80.0  PLT 189 174 172 153 155   Basic Metabolic Panel: Recent Labs  Lab 12/25/18 0641 12/25/18 2359 12/26/18 0417 12/27/18 0526 12/28/18 0255  NA 136 133* 133* 131* 131*  K 4.9 3.9 3.9 3.6 3.8    CL 98 95* 95* 89* 86*  CO2 29 29 27 31  34*  GLUCOSE 112* 108* 123* 124* 90  BUN 32* 39* 37* 29* 29*  CREATININE 1.28* 1.26* 1.26* 1.15 1.20  CALCIUM 9.1 8.6* 8.5* 8.6* 8.4*  MG 1.9  --  1.9 1.5* 1.9  PHOS  --   --  2.4* 2.3* 3.6   GFR: Estimated Creatinine Clearance: 80 mL/min (by C-G formula based on SCr of 1.2 mg/dL). Liver Function Tests: Recent Labs  Lab 12/24/18 2237 12/26/18 0417 12/27/18 0526 12/28/18 0255  AST 55* 44* 37 36  ALT 31 25 25 23   ALKPHOS 112 91 91 92  BILITOT 2.4* 2.5* 2.2* 1.4*  PROT 7.7 6.5 6.8 6.7  ALBUMIN 3.3* 2.8* 2.7* 2.6*   No results for input(s): LIPASE, AMYLASE in the last 168 hours. No results for input(s): AMMONIA in the last 168 hours. Coagulation Profile: Recent Labs  Lab 12/25/18 1712 12/26/18 0417 12/27/18 0526 12/28/18 0255  INR 1.6* 1.9* 2.3* 2.5*   Cardiac Enzymes: Recent Labs  Lab 12/24/18 2237 12/26/18 0417 12/26/18 0924  TROPONINI 0.08* 0.07* 0.06*   BNP (last 3 results) No results for input(s): PROBNP in the last 8760 hours. HbA1C: Recent Labs    12/27/18 0526  HGBA1C 6.8*   CBG: Recent Labs  Lab 12/28/18 0213  GLUCAP 90   Lipid Profile: No results for input(s): CHOL, HDL, LDLCALC, TRIG, CHOLHDL, LDLDIRECT in the last 72 hours. Thyroid Function Tests: No results for input(s): TSH, T4TOTAL, FREET4, T3FREE, THYROIDAB in the last 72 hours. Anemia Panel: Recent Labs    12/27/18 0526  VITAMINB12 718  FOLATE 7.3  FERRITIN 131  TIBC 301  IRON 14*  RETICCTPCT 1.9   Sepsis Labs: Recent Labs  Lab 12/25/18 2359 12/26/18 0417 12/28/18 0252  LATICACIDVEN 2.1* 1.4 1.4    Recent Results (from the past 240 hour(s))  SARS Coronavirus 2 (CEPHEID - Performed in [facility] hospital lab), Hosp Order     Status: None   Collection Time: 12/24/18  10:54 PM  Result Value Ref Range Status   SARS Coronavirus 2 NEGATIVE NEGATIVE Final    Comment: (NOTE) If result is NEGATIVE SARS-CoV-2 target nucleic acids are  NOT DETECTED. The SARS-CoV-2 RNA is generally detectable in upper and lower  respiratory specimens during the acute phase of infection. The lowest  concentration of SARS-CoV-2 viral copies this assay can detect is 250  copies / mL. A negative result does not preclude SARS-CoV-2 infection  and should not be used as the sole basis for treatment or other  patient management decisions.  A negative result may occur with  improper specimen collection / handling, submission of specimen other  than nasopharyngeal swab, presence of viral mutation(s) within the  areas targeted by this assay, and inadequate number of viral copies  (<250 copies / mL). A negative result must be combined with clinical  observations, patient history, and epidemiological information. If result is POSITIVE SARS-CoV-2 target nucleic acids are DETECTED. The SARS-CoV-2 RNA is generally detectable in upper and lower  respiratory specimens dur ing the acute phase of infection.  Positive  results are indicative of active infection with SARS-CoV-2.  Clinical  correlation with patient history and other diagnostic information is  necessary to determine patient infection status.  Positive results do  not rule out bacterial infection or co-infection with other viruses. If result is PRESUMPTIVE POSTIVE SARS-CoV-2 nucleic acids MAY BE PRESENT.   A presumptive positive result was obtained on the submitted specimen  and confirmed on repeat testing.  While 2019 novel coronavirus  (SARS-CoV-2) nucleic acids may be present in the submitted sample  additional confirmatory testing may be necessary for epidemiological  and / or clinical management purposes  to differentiate between  SARS-CoV-2 and other Sarbecovirus currently known to infect humans.  If clinically indicated additional testing with an alternate test  methodology [id number]) is advised. The SARS-CoV-2 RNA is generally  detectable in upper and lower respiratory sp ecimens  during the acute  phase of infection. The expected result is Negative. Fact Sheet for Patients:  BoilerBrush.com.cy Fact Sheet for Healthcare Providers: https://pope.com/ This test is not yet approved or cleared by the [place] FDA and has been authorized for detection and/or diagnosis of SARS-CoV-2 by FDA under an Emergency Use Authorization (EUA).  This EUA will remain in effect (meaning this test can be used) for the duration of the COVID-19 declaration under Section 564(b)(1) of the Act, 21 U.S.C. section 360bbb-3(b)(1), unless the authorization is terminated or revoked sooner. Performed at [facility], [address], [place], [place] [address]      Radiology Studies: Dg Chest Port 1 View  Result Date: 12/27/2018 CLINICAL DATA:  Shortness of breath. EXAM: PORTABLE CHEST 1 VIEW COMPARISON:  Chest x-ray dated Dec 24, 2018. FINDINGS: Stable mild cardiomegaly. Normal mediastinal contours. Normal pulmonary vascularity. No focal consolidation, pleural effusion, or pneumothorax. No acute osseous abnormality. IMPRESSION: No active disease. Electronically Signed   By: Obie Dredge M.D.   On: 12/27/2018 08:00   Ct Maxillofacial Wo Contrast  Result Date: 12/26/2018 CLINICAL DATA:  Facial pain and swelling EXAM: CT MAXILLOFACIAL WITHOUT CONTRAST TECHNIQUE: Multidetector CT imaging of the maxillofacial structures was performed. Multiplanar CT image reconstructions were also generated. COMPARISON:  None. FINDINGS: Osseous: No acute fracture is identified. Diffuse dental caries are identified. No periapical abscess is noted. Orbits: Orbits are within normal limits. Sinuses: Paranasal sinuses demonstrate mucosal thickening bilaterally within the maxillary antra. Air-fluid level is noted within the left maxillary antrum  consistent with acute sinusitis. Soft tissues: Soft tissue swelling is noted in the left face surrounding the  maxilla and mandible. No focal hematoma is noted. Some subcutaneous air is noted along the anterior aspect of the maxilla. This likely represents some localized infection. Limited intracranial: Within normal limits. IMPRESSION: No acute bony abnormality is noted. Soft tissue swelling is seen with some subcutaneous air consistent with focal inflammatory change. Multiple dental caries are noted. Inflammatory changes in the paranasal sinuses with air-fluid level in the left maxillary antrum consistent with acute sinusitis. Electronically Signed   By: Alcide CleverMark  Lukens M.D.   On: 12/26/2018 21:35   Scheduled Meds:  aspirin EC  81 mg Oral Daily   atorvastatin  40 mg Oral q1800   Chlorhexidine Gluconate Cloth  6 each Topical Daily   clindamycin  450 mg Oral Q8H   digoxin  0.125 mg Oral Daily   furosemide  40 mg Intravenous BID   guaiFENesin  1,200 mg Oral BID   mometasone-formoterol  2 puff Inhalation BID   nicotine  14 mg Transdermal Daily   Warfarin - Pharmacist Dosing Inpatient   Does not apply q1800   Continuous Infusions:   LOS: 3 days    This patient is critically ill with multiple organ system failure and requires high complexity decision making for assessment and support, frequent evaluation and titration of therapies and application of advanced monitoring technologies and extensive interpretation of multiple databases   CRITICAL CARE TIME: 32 Nonconsecutive Minutes Devoted to Patient Care Services Described in this note including but not limited to Reviewing Chart, Seeing Patient, Coordinating Care, Updating Family if available and Discussing with Consultants   Merlene Laughtermair Latif Ryland [place], DO [facility] PAGER is on AMION  If 7PM-7AM, please contact night-coverage [email or web address] Password [facility] 12/28/2018, 6:46 PM

## 2018-12-28 NOTE — Progress Notes (Signed)
At 0220 pt called RN c/o nausea, slight dizziness, and "fluttering" in chest. Pt also notably more lethargic. BP found to be 70's over palp w/ use of doppler. MD Blount notified and c/f heart hx deferred to cardiology. Cardiology ordered EKG, Labs which were done. BP remains SBP 80's with diastolic in 50's/60's. HR 90's-110's. Afebrile. CBG 90. No additional orders at this time.   Heparin gtt remains infusing. 0400 Lasix held d/t pt symptomatic hypotension. Skin intact. Will continue to closely monitor patient.   Although pt was informed of medications he was given he did not inform nurse that he had stopped taking Cozaar due to similar symptoms. This information was given by patient during above incident.

## 2018-12-29 LAB — COMPREHENSIVE METABOLIC PANEL
ALT: 22 U/L (ref 0–44)
AST: 33 U/L (ref 15–41)
Albumin: 2.7 g/dL — ABNORMAL LOW (ref 3.5–5.0)
Alkaline Phosphatase: 107 U/L (ref 38–126)
Anion gap: 10 (ref 5–15)
BUN: 32 mg/dL — ABNORMAL HIGH (ref 6–20)
CO2: 31 mmol/L (ref 22–32)
Calcium: 8.5 mg/dL — ABNORMAL LOW (ref 8.9–10.3)
Chloride: 87 mmol/L — ABNORMAL LOW (ref 98–111)
Creatinine, Ser: 1.17 mg/dL (ref 0.61–1.24)
GFR calc Af Amer: >60 mL/min (ref >60)
GFR calc non Af Amer: >60 mL/min (ref >60)
Glucose, Bld: 110 mg/dL — ABNORMAL HIGH (ref 70–99)
Potassium: 4 mmol/L (ref 3.5–5.1)
Sodium: 128 mmol/L — ABNORMAL LOW (ref 135–145)
Total Bilirubin: 1.6 mg/dL — ABNORMAL HIGH (ref 0.3–1.2)
Total Protein: 6.8 g/dL (ref 6.5–8.1)

## 2018-12-29 LAB — CBC
HCT: 37.3 % — ABNORMAL LOW (ref 39.0–52.0)
Hemoglobin: 12.1 g/dL — ABNORMAL LOW (ref 13.0–17.0)
MCH: 25.7 pg — ABNORMAL LOW (ref 26.0–34.0)
MCHC: 32.4 g/dL (ref 30.0–36.0)
MCV: 79.2 fL — ABNORMAL LOW (ref 80.0–100.0)
Platelets: 168 10*3/uL (ref 150–400)
RBC: 4.71 MIL/uL (ref 4.22–5.81)
RDW: 17.5 % — ABNORMAL HIGH (ref 11.5–15.5)
WBC: 10.2 10*3/uL (ref 4.0–10.5)
nRBC: 0.3 % — ABNORMAL HIGH (ref 0.0–0.2)

## 2018-12-29 LAB — PROTIME-INR
INR: 1.6 — ABNORMAL HIGH (ref 0.8–1.2)
Prothrombin Time: 18.7 seconds — ABNORMAL HIGH (ref 11.4–15.2)

## 2018-12-29 LAB — PHOSPHORUS: Phosphorus: 3.4 mg/dL (ref 2.5–4.6)

## 2018-12-29 LAB — MAGNESIUM: Magnesium: 1.9 mg/dL (ref 1.7–2.4)

## 2018-12-29 MED ORDER — WARFARIN SODIUM 5 MG PO TABS
5.0000 mg | ORAL_TABLET | Freq: Once | ORAL | Status: DC
  Filled 2018-12-29: qty 1

## 2018-12-29 MED ORDER — FUROSEMIDE 40 MG PO TABS
40.0000 mg | ORAL_TABLET | Freq: Every day | ORAL | Status: DC
  Filled 2018-12-29: qty 1

## 2018-12-29 MED ORDER — FENTANYL CITRATE (PF) 100 MCG/2ML IJ SOLN
25.0000 ug | Freq: Once | INTRAMUSCULAR | Status: AC
  Administered 2018-12-29: 25 ug via INTRAVENOUS
  Filled 2018-12-29: qty 2

## 2018-12-29 MED ORDER — SPIRONOLACTONE 12.5 MG HALF TABLET
12.5000 mg | ORAL_TABLET | Freq: Every day | ORAL | Status: DC
  Administered 2018-12-29 – 2018-12-30 (×2): 12.5 mg via ORAL
  Filled 2018-12-29 (×3): qty 1

## 2018-12-29 NOTE — Progress Notes (Signed)
PROGRESS NOTE    Mark [name]  FTD:[id number] DOB: Feb 02, 1976 DOA: 12/24/2018 PCP: Dorena Dew, FNP  Brief Narrative:  HPI per Dr. Zada Finders on 12/25/2018 Mark [name] is a 43 y.o. male with medical history significant for chronic systolic CHF (EF 06-23% with severe diffuse hypokinesis by TTE 09/28/2017), NICM by cardiac cath 2017, history of right MCA CVA 2019, asthma, and substance use who presents to the ED for progressive shortness of breath.  Patient states he is running low on his home Lasix which he has been cutting in half in order to extend the duration of his medications.  He ran out completely about 4 days ago.  He has had progressive swelling of both of his legs as well as shortness of breath and orthopnea.  He denies any chest pain, cough, fever, dysuria.  He for smoking with 1 pack of cigarettes lasting about 3-4 days.  He denies a significant alcohol use.  He admits to occasional cocaine use and says last use was about 2 weeks ago.  ED Course:  Initial vitals showed BP 114/94, pulse 110, RR 25, temp 97.6 Fahrenheit, SPO2 100% on nonrebreather.  Labs notable for WBC 6.7, hemoglobin 12.9, platelets 189, potassium 4.3, BUN 31, creatinine 1.15, troponin I 0.08, BNP 3973.6, digoxin level 0.5, SARS-CoV-2 testing negative.  Portable chest x-ray showed enlarged cardiac silhouette without focal consolidation, pleural effusion or interstitial edema.  Patient was given IV Lasix 40 mg x 1 and placed on BiPAP with improvement in respiratory status.  The hospital service was consulted to admit for further evaluation and management.  **Interim History The patient was weaned off of BiPAP and cardiology consulted and patient is currently being diuresed.  Patient was also started on a heparin drip with a warfarin bridge for his left ventricular thrombus and Heparin gtt has now stopped.  Per cardiology he is not a candidate for ICD given his noncompliance and polysubstance abuse and  they are worried about him being on Coumadin given his noncompliance will continue at this time and per them is not a candidate for advanced therapies but may be if he remains off substances. He is hypotensive yesterday and felt he was gone into cardiogenic shock so cardiology held some of his medications as well as decreased his diuresis.  I give the patient to 50 mL bolus and patient was looking better today so cardiology is adding back to medications.  Per Dr. Meda Coffee he is not a candidate for inotropic therapy infusion or LVAD/transport down the road.  Dr. Meda Coffee recommended palliative care consultation and Dr. Domingo Cocking met with the patient yesterday and patient is in denial about his condition and not ready to accept terms.  Since his blood pressure is improved today he is able to receive diuretics and he is significantly improved and per cardiology is back to his baseline.  Will transition to his home meds and start mobilizing if tolerated and have recommended physical therapy evaluation.    Hospitalization has been complicated by patient's dental pain and he feels that this is his primary complaint.  I have tried to get in touch with Dr. Valrie Hart our in-house dentist for the last several days with no luck.  And also call the dentist on call Dr. Mariel Sleet I was unable to get him too.  For now we will continue pain control as well as antibiotics with clindamycin  Assessment & Plan:   Principal Problem:   Acute on chronic systolic congestive heart failure (  [place]) Active Problems:   Asthma   Elevated troponin I level   History of right MCA stroke   Demand ischemia (HCC)   LV (left ventricular) mural thrombus without MI   SOB (shortness of breath)   Cocaine use  Acute Respiratory Failure with Hypoxia in the setting of acute CHF exacerbation requiring noninvasive positive pressure ventilation with BiPAP, improved -Improved but was still complaining of some SOB -Patient was placed on BiPAP initially  and now been transitioned off -Currently saturating on room air; SARS-CoV-2 Negative  -Repeat CXR on 12/27/2018 showed "Stable mild cardiomegaly. Normal mediastinal contours. Normal pulmonary vascularity. No focal consolidation, pleural effusion, or pneumothorax. No acute osseous abnormality." -Continue Diuresis per cardiology as below -Repeat CXR in AM   Acute on Chronic Combined Systolic and Diastolic CHF exacerbation with recent echo 5 to 10% and severe right ventricular systolic dysfunction and Grade 2 DD now complicated by Cardiogenic Shock, improving  -EF 10-15% by echocardiogram February 2019, NICM by cardiac cath 2017.   -BNP on Admission was 3,973.6 and has improved to 848.5 -Patient had significant volume overload on exam with improvement in respiratory symptoms on BiPAP. -Admit to stepdown unit for as needed BiPAP and now of off of it but upgraded to ICU Status because of Frequent Monitoring  -IV Lasix 40 mg twice daily changed to q6h by Cardiology but this was cut back to BID given Hypotension Dr. Meda Coffee recommending continuing pain transitioning to home medications tomorrow. -Losartan 25 mg po qHS and Spironolactone 25 mg po Daily stopped due to Hypotension yesterday but Dr. Meda Coffee resumed his spironolactone at 12.5 mg p.o. daily today -C/w Digoxin 0.125 mg po Daily -Strict I's/O's and Daily Weights; Patient's weight is fluctuating and Up 6 lbs since yesterday and is now 158 -Baseline Weight is 147 lbs per Cardiology and weight was not done today but per cardiology he is at his baseline -He is - 7,150 mL since admission -Will place on Heart Healthy Diet and Fluid Restrict to 1500 mL -Repeat Echocardiogram this visit showed moderate LV dilatation and severe systolic dysfunction with LVEF of 5 to 10% and diffuse hypokinesis.  There is also new large apical thrombus measuring 2.7 x 1.6 cm and grade 2 diastolic dysfunction with severely elevated filling pressures -Has not received any  metolazone today -Had to give a small 250 mL bolus over 4 Hours to keep MAP above 65 yesterday -Dr. Meda Coffee currently not a candidate for advanced therapies and no indication for ICD now as he has Significant Substance Abuse -Dr. Meda Coffee evaluated and per her note and he has a NYHA class IV and says the patient was hypotensive today she felt he was in cardiogenic shock with severe biventricular failure and diuretics were held this morning secondary to hypotension but they have been reduced to twice daily dosing.  States weight is going up and given his long-term noncompliance and ongoing cocaine use she states the patient not a candidate for advanced therapies such as inotrope infusion or LVAD and transplant down the road and recommends against Milrinone drip -Palliative care consult for goals of care discussion I discussed with Dr. Domingo Cocking who will see the patient in consultation today -Appreciate Dr. Domingo Cocking seen the patient and await further continued goals of care discussions.  Elevated Troponin Level -Mild troponin elevation of 0.08, likely due to demand ischemia.  -He has chronically elevated troponin on review of labs.   -We will continue to trend and trending down as went from 0.08 -> 0.07 ->  0.06 -Continue to Monitor for Chest Pain   History of Right MCA Stroke -No residual deficit. -Had an MRI of Brain on 09/28/2017 which showed moderate to large right MCA distribution infarct. There was also associated slow/absent flow within the associated right M2 branch -Suspected to be cardioembolic, however he was not started on anticoagulation due to his history of nonadherence to medication as an outpatient -He has been on Aspirin 81 mg daily.  He was on statin in the past but does not appear to be taking it. -Continue Aspirin 81 mg Daily -Start Atorvastatin 40 mg Daily -Per Cardiology Ideally needs to be on Warfarin but not compliant enough per them but since he was started on admission, will  continue it and Cardiology states they will follow patient as an outpatient for his Coumadin   Asthma -Not obviously wheezing on examination.   -Respiratory symptoms more consistent with CHF exacerbation. -Continue Dulera 2 puff IH BID and Albuterol 2.5 mg Neb q4hprn Wheezing and SOB  Substance Use Disorder/Tobacco use: -Patient acknowledges he needs to quit tobacco. Smoking Cessation Counseling given again  -He admits to cocaine use with last use about 2 weeks ago. -C/w Nicotine Patch 14 mg TD q24h -Obtained UDS and was + for Cocaine   Lactic Acidosis -Improved. Lactic Acid Level went from 2.1 -> 1.4 -Will not continue to Trend   Hyponatremia -Patient's Na+ went from 136 -> 133 -> 131 -> 128 -In the Setting of Hypervolemic Hyponatremia but now likley 2/2 to Diuresis  -C/w Diuresis per Cardiology -Repeat CMP in AM  Leukocytosis in the Setting of Dental Infection, improving -WBC went from 6.7 -> 9.0 -> 21.7 -> 14.1 -> 13.2 -> 10.2 -? Reactive and in the setting of Diuresis but now likely source is mouth as patient has swelling and pain in Jaw and specifically left side of face -Patient is Currently Afebrile with a Tmax of 98.5 in the last 24 hours -Wanted to obtain a DG Orthopantogram but no longer available at [facility] -Instead ordered CT Maxillofacial and showed "No acute bony abnormality is noted. Soft tissue swelling is seen with some subcutaneous air consistent with focal inflammatory change. Multiple dental caries are noted. Inflammatory changes in the paranasal sinuses with air-fluid level in the left maxillary antrum consistent with acute sinusitis." -Patient was started on Clindamycin IV given PCN Allergy and will transition to po Abx -Have attempted to reach our In-house Dentist Dr. Enrique Sack for evaluation but no response and left a message. Called on Call Dentist Dr. Mariel Sleet and no response   -Continue to Monitor for S/Sx of Infection -Repeat CBC in AM   Normocytic  Anemia  -Patient's hemoglobin/hematocrit went from 12.9/41.1 -> 13.4/42.8 -> 12.8/37.6 -> 12.6/39.0 -> 11.6/35.7 -> 12.1/37.3 -Continue monitor for signs and symptoms of bleeding as patient not anticoagulated with heparin drip as well as Coumadin -Checked Anemia Panel and showed iron level 14, U IBC of 287, TIBC of 301, saturation ratios of 5%, ferritin level 131, folate level 7.3, and vitamin B12 of 718 -Repeat CBC in a.m.  Hypophosphatemia -Patient's phosphorus levels morning was 2.3 and improved to 3.4 -Continue to monitor replete as necessary  Hypomagnesemia -Patient's Mag Level was 1.5 and improved to 1.9 -Continue to Monitor and Replete as Necessary -Repeat Mag Level in AM   Renal Insuffiencey -Patient's BUN/creatinine went from 31/1.15 ->  37/1.26 -> 29/1.15 -> 29/1.20 -> 32/1.17 -In the setting of diuresis -Continue monitor and trend renal function -Avoid nephrotoxic medications if possible as  well as contrast dyes and hypotension -Repeat CMP in AM   Left Ventricle Thrombus -Patient is now on Warfarin with Heparin gtt and Heparin gtt to be stopped toady  -INR was 1.6  this AM; Pharmacy adjusting and patient to get 5 mg po tonight -Per Pharmacy: "Patient will need anticoagulation bridge for at least the first 5 days of warfarin therapy and until two days of consecutive INR readings >2"; Patient had been on Heparin gtt for 4 days and has had 2 INR's >2 so will stop.   -Cardiology was concerned about his noncompliance but since he was started on Heparin gtt and Coumadin on Admission will continue and per discussion with CARDs PA (Could not get in touch with Dr. Meda Coffee after several attempts yesterday) she discussed with Dr. Meda Coffee who states to continue Coumadin and Cardiology will follow up at D/C and continue to Monitor INR's; Now Cardiology states that if he becomes a Palliative Patient after [place] then to D/C Coumadin  -Will need close outpatient Monitoring if this is continued and  if patient becomes more compliant   Hyperglycemia in the setting of New Onset Diabetes -Patient's Blood Sugar has been ranging from 90-123 -Checked HbA1c and was 6.8; Last HbA1c was 6.4 -Continue to Monitor Blood Sugars Carefully -IF necessary will place on Sensitive Novolog SSI  Hyperbilirubinemia, improving  -Likely Reactive -T Bili was 2.4 and trended down to 1.4 and is now 1.6 -Continue to Monitor and Trend -Repeat CMP in AM   Elevated AST -In the setting of Volume Overload -Trending Down as went from 55 -> 44 -> 37 -> 36 -> 33 -Continue to Monitor and Trend Closely  -Repeat CMP in AM   DVT prophylaxis: Anticoagulated with Heparin gtt and Coumadin Code Status: FULL CODE Family Communication: No family present at bedside  Disposition Plan: Upgraded to ICU Status because of Cardiogenic Shock yesterday but will transfer back to SDU and possibly transfer to Medical Floor in AM if stable/or D/C in next 24-48 hours    Consultants:   Cardiology Dr. Meda Coffee  Palliative Care Medicine Dr. Domingo Cocking    Procedures:  ECHOCARDIOGRAM IMPRESSIONS    1. The right ventricle has severely reduced systolic function. The cavity was severely enlarged. There is no increase in right ventricular wall thickness.  2. Left atrial size was mildly dilated.  3. Right atrial size was moderately dilated.  4. Severely thickened tricuspid valve leaflets.  5. The mitral valve is grossly normal. Mitral valve regurgitation is moderate to severe by color flow Doppler. The MR jet is posteriorly-directed.  6. The tricuspid valve is grossly normal.  7. The pulmonic valve was grossly normal. Pulmonic valve regurgitation is moderate is mild by color flow Doppler.  SUMMARY   Moderate LV dilatation and severe systolic dysfunction with LVEF 5-10% and diffuse hypokinesis. 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 Grade 2 diastolic dysfunction with severely elevated filling pressures.    Right ventricle is severely dilated and now with severe systolic dysfunction (previously mild to moderate). RVSP is 42 mmHg consistent with moderate pulmonary hypertension, however underestimated in teh settings of severe RV dysfunction. RVSP previously 56 mmHg.   Moderate to severe mitral and severe tricuspid regurgitation.  FINDINGS  Left Ventricle: The left ventricle has severely reduced systolic function, wih an ejection fraction of 5-10%. The cavity size was normal. There is no increase in left ventricular wall thickness. Definity contrast agent was given IV to delineate the left  ventricular endocardial  borders.  Right Ventricle: The right ventricle has severely reduced systolic function. The cavity was severely enlarged. There is no increase in right ventricular wall thickness.  Left Atrium: Left atrial size was mildly dilated.  Right Atrium: Right atrial size was moderately dilated. Right atrial pressure is estimated at 15 mmHg.  Interatrial Septum: No atrial level shunt detected by color flow Doppler.  Pericardium: There is no evidence of pericardial effusion.  Mitral Valve: The mitral valve is grossly normal. Mitral valve regurgitation is moderate to severe by color flow Doppler. The MR jet is posteriorly-directed.  Tricuspid Valve: The tricuspid valve is grossly normal. Tricuspid valve regurgitation was not visualized by color flow Doppler. The tricuspid valve is severely thickened.  Aortic Valve: The aortic valve is normal in structure. Aortic valve regurgitation was not assessed by color flow Doppler.  Pulmonic Valve: The pulmonic valve was grossly normal. Pulmonic valve regurgitation is moderate is mild by color flow Doppler.  Venous: The inferior vena cava is normal in size with greater than 50% respiratory variability.    +--------------+--------++ LEFT VENTRICLE              +----------------+---------++ +--------------+--------++      Diastology                 PLAX 2D                     +----------------+---------++ +--------------+--------++      LV e' lateral:  8.03 cm/s LVIDd:        5.59 cm       +----------------+---------++ +--------------+--------++      LV E/e' lateral:12.3      LVIDs:        5.32 cm       +----------------+---------++ +--------------+--------++      LV e' medial:   3.75 cm/s LV PW:        0.96 cm       +----------------+---------++ +--------------+--------++      LV E/e' medial: 26.4      LV IVS:       0.90 cm       +----------------+---------++ +--------------+--------++ LVOT diam:    2.20 cm  +--------------+--------++ LV SV:        17 ml    +--------------+--------++ LV SV Index:  8.69     +--------------+--------++ LVOT Area:    3.80 cm +--------------+--------++                        +--------------+--------++   +------------------+---------++ LV Volumes (MOD)            +------------------+---------++ LV area d, A2C:   59.90 cm +------------------+---------++ LV area d, A4C:   53.00 cm +------------------+---------++ LV area s, A2C:   51.80 cm +------------------+---------++ LV area s, A4C:   47.70 cm +------------------+---------++ LV major d, A2C:  10.80 cm  +------------------+---------++ LV major d, A4C:  10.40 cm  +------------------+---------++ LV major s, A2C:  9.93 cm   +------------------+---------++ LV major s, A4C:  9.73 cm   +------------------+---------++ LV vol d, MOD A2C:275.0 ml  +------------------+---------++ LV vol d, MOD A4C:222.0 ml  +------------------+---------++ LV vol s, MOD A2C:230.0 ml  +------------------+---------++ LV vol s, MOD A4C:194.0 ml  +------------------+---------++ LV SV MOD A2C:    45.0 ml   +------------------+---------++ LV SV MOD A4C:    222.0 ml  +------------------+---------++ LV SV MOD BP:     39.2 ml    +------------------+---------++  +---------------+---------++ RIGHT VENTRICLE          +---------------+---------++  RV S prime:    8.93 cm/s +---------------+---------++ TAPSE (M-mode):1.3 cm    +---------------+---------++  +-----------+-------++----------++ LEFT ATRIUM       Index      +-----------+-------++----------++ LA diam:   4.20 cm2.19 cm/m +-----------+-------++----------++  +------------+-----------++ AORTIC VALVE            +------------+-----------++ LVOT Vmax:  45.00 cm/s  +------------+-----------++ LVOT Vmean: 27.100 cm/s +------------+-----------++ LVOT VTI:   0.048 m     +------------+-----------++   +-------------+-------++ AORTA                +-------------+-------++ Ao Root diam:3.00 cm +-------------+-------++ Ao Asc diam: 2.70 cm +-------------+-------++  +--------------+----------++ +---------------+-----------++ MITRAL VALVE             TRICUSPID VALVE            +--------------+----------++ +---------------+-----------++ MV Area (PHT):5.38 cm   TR Peak grad:  26.0 mmHg   +--------------+----------++ +---------------+-----------++ MV PHT:       40.89 msec TR Vmax:       255.00 cm/s +--------------+----------++ +---------------+-----------++ MV Decel Time:141 msec   +--------------+----------++ +--------------+-------+ +--------------+----------++ SHUNTS                MV E velocity:99.00 cm/s +--------------+-------+ +--------------+----------++ Systemic VTI: 0.05 m  MV A velocity:46.30 cm/s +--------------+-------+ +--------------+----------++ Systemic Diam:2.20 cm MV E/A ratio: 2.14       +--------------+-------+ +--------------+----------++   Antimicrobials: Anti-infectives (From admission, onward)   Start     Dose/Rate Route Frequency Ordered Stop   12/28/18 2200  clindamycin (CLEOCIN) capsule 450 mg     450 mg Oral Every 8  hours 12/28/18 1600     12/26/18 1800  clindamycin (CLEOCIN) IVPB 600 mg  Status:  Discontinued     600 mg 100 mL/hr over 30 Minutes Intravenous Every 8 hours 12/26/18 1709 12/28/18 1555     Subjective: Seen and examined at bedside and states that his breathing was okay but his main complaint was his mouth and he states that he feels like he has a severe infection that needs to be looked at and no one is looking at it.  I discussed with him that I been trying to get in touch with a dentist and been unsuccessful.  Patient's diuresis has been reinitiated and he is not as volume overloaded.  Denies any chest pain, lightheadedness or dizziness and feels okay but states his mouth hurts and that he cannot even eat.  No other concerns or complaints at this time  Objective: Vitals:   12/29/18 1100 12/29/18 1200 12/29/18 1236 12/29/18 1300  BP:  117/87  110/84  Pulse:      Resp: (!) 22 (!) 21  (!) 25  Temp:   98 F (36.7 C)   TempSrc:   Oral   SpO2:  99%  100%  Weight:      Height:        Intake/Output Summary (Last 24 hours) at 12/29/2018 1612 Last data filed at 12/29/2018 1400 Gross per 24 hour  Intake 60 ml  Output 1825 ml  Net -1765 ml   Filed Weights   12/26/18 0357 12/27/18 0421 12/28/18 1000  Weight: 74.5 kg 69.2 kg 72 kg   Examination: Physical Exam:  Constitutional: Well-nourished, well-developed African-American male currently secondary complaints of mouth pain and states that he did not rest because of it Eyes: Lids extract are normal.  Sclera anicteric ENMT: External ears and nose appear normal.  Grossly normal hearing.  Mouth and face is still somewhat  swollen patient is complaining of severe pain Neck: Appears supple with no JVD Respiratory: Diminished auscultation bilaterally no patient wheezing, rales, rhonchi.  Patient did have some mild crackles and coarse breath sounds but is not wearing any supplemental oxygen via nasal Cardiovascular: Remains tachycardic but has a  regular rhythm.  3 of 6 stalk murmur.  Has 1+ lower extremity edema and is improving Abdomen: Soft, nontender, nondistended.  Bowel sounds present GU: Deferred Musculoskeletal: No contractures or cyanosis.  No joint fomites in upper extremity Skin: Skin is warm and dry no appreciable rashes or lesions noted skin evaluation but does have some diffuse tattoos scattered throughout his body Neurologic: Cranial nerves II through XII grossly intact no appreciable focal deficits  Psychiatric: Normal judgment and insight.  Patient is awake, alert, oriented x3 and slightly agitated today  Data Reviewed: I have personally reviewed following labs and imaging studies  CBC: Recent Labs  Lab 12/25/18 0641 12/26/18 0417 12/27/18 0526 12/28/18 0255 12/29/18 0225  WBC 9.0 21.7* 14.1* 13.2* 10.2  NEUTROABS  --   --  12.1* 9.2*  --   HGB 13.4 12.3* 12.6* 11.6* 12.1*  HCT 42.8 37.6* 39.0 35.7* 37.3*  MCV 81.5 80.5 81.1 80.0 79.2*  PLT 174 172 153 155 428   Basic Metabolic Panel: Recent Labs  Lab 12/25/18 0641 12/25/18 2359 12/26/18 0417 12/27/18 0526 12/28/18 0255 12/29/18 0851  NA 136 133* 133* 131* 131* 128*  K 4.9 3.9 3.9 3.6 3.8 4.0  CL 98 95* 95* 89* 86* 87*  CO2 '29 29 27 31 ' 34* 31  GLUCOSE 112* 108* 123* 124* 90 110*  BUN 32* 39* 37* 29* 29* 32*  CREATININE 1.28* 1.26* 1.26* 1.15 1.20 1.17  CALCIUM 9.1 8.6* 8.5* 8.6* 8.4* 8.5*  MG 1.9  --  1.9 1.5* 1.9 1.9  PHOS  --   --  2.4* 2.3* 3.6 3.4   GFR: Estimated Creatinine Clearance: 82.1 mL/min (by C-G formula based on SCr of 1.17 mg/dL). Liver Function Tests: Recent Labs  Lab 12/24/18 2237 12/26/18 0417 12/27/18 0526 12/28/18 0255 12/29/18 0851  AST 55* 44* 37 36 33  ALT '31 25 25 23 22  ' ALKPHOS 112 91 91 92 107  BILITOT 2.4* 2.5* 2.2* 1.4* 1.6*  PROT 7.7 6.5 6.8 6.7 6.8  ALBUMIN 3.3* 2.8* 2.7* 2.6* 2.7*   No results for input(s): LIPASE, AMYLASE in the last 168 hours. No results for input(s): AMMONIA in the last 168  hours. Coagulation Profile: Recent Labs  Lab 12/25/18 1712 12/26/18 0417 12/27/18 0526 12/28/18 0255 12/29/18 0225  INR 1.6* 1.9* 2.3* 2.5* 1.6*   Cardiac Enzymes: Recent Labs  Lab 12/24/18 2237 12/26/18 0417 12/26/18 0924  TROPONINI 0.08* 0.07* 0.06*   BNP (last 3 results) No results for input(s): PROBNP in the last 8760 hours. HbA1C: Recent Labs    12/27/18 0526  HGBA1C 6.8*   CBG: Recent Labs  Lab 12/28/18 0213  GLUCAP 90   Lipid Profile: No results for input(s): CHOL, HDL, LDLCALC, TRIG, CHOLHDL, LDLDIRECT in the last 72 hours. Thyroid Function Tests: No results for input(s): TSH, T4TOTAL, FREET4, T3FREE, THYROIDAB in the last 72 hours. Anemia Panel: Recent Labs    12/27/18 0526  VITAMINB12 718  FOLATE 7.3  FERRITIN 131  TIBC 301  IRON 14*  RETICCTPCT 1.9   Sepsis Labs: Recent Labs  Lab 12/25/18 2359 12/26/18 0417 12/28/18 0252  LATICACIDVEN 2.1* 1.4 1.4    Recent Results (from the past 240 hour(s))  SARS Coronavirus  2 (CEPHEID - Performed in [place] lab), Hosp Order     Status: None   Collection Time: 12/24/18 10:54 PM  Result Value Ref Range Status   SARS Coronavirus 2 NEGATIVE NEGATIVE Final    Comment: (NOTE) If result is NEGATIVE SARS-CoV-2 target nucleic acids are NOT DETECTED. The SARS-CoV-2 RNA is generally detectable in upper and lower  respiratory specimens during the acute phase of infection. The lowest  concentration of SARS-CoV-2 viral copies this assay can detect is 250  copies / mL. A negative result does not preclude SARS-CoV-2 infection  and should not be used as the sole basis for treatment or other  patient management decisions.  A negative result may occur with  improper specimen collection / handling, submission of specimen other  than nasopharyngeal swab, presence of viral mutation(s) within the  areas targeted by this assay, and inadequate number of viral copies  (<250 copies / mL). A negative result  must be combined with clinical  observations, patient history, and epidemiological information. If result is POSITIVE SARS-CoV-2 target nucleic acids are DETECTED. The SARS-CoV-2 RNA is generally detectable in upper and lower  respiratory specimens dur ing the acute phase of infection.  Positive  results are indicative of active infection with SARS-CoV-2.  Clinical  correlation with patient history and other diagnostic information is  necessary to determine patient infection status.  Positive results do  not rule out bacterial infection or co-infection with other viruses. If result is PRESUMPTIVE POSTIVE SARS-CoV-2 nucleic acids MAY BE PRESENT.   A presumptive positive result was obtained on the submitted specimen  and confirmed on repeat testing.  While 2019 novel coronavirus  (SARS-CoV-2) nucleic acids may be present in the submitted sample  additional confirmatory testing may be necessary for epidemiological  and / or clinical management purposes  to differentiate between  SARS-CoV-2 and other Sarbecovirus currently known to infect humans.  If clinically indicated additional testing with an alternate test  methodology [phone]) is advised. The SARS-CoV-2 RNA is generally  detectable in upper and lower respiratory sp ecimens during the acute  phase of infection. The expected result is Negative. Fact Sheet for Patients:  StrictlyIdeas.no Fact Sheet for Healthcare Providers: BankingDealers.co.za This test is not yet approved or cleared by the [place] FDA and has been authorized for detection and/or diagnosis of SARS-CoV-2 by FDA under an Emergency Use Authorization (EUA).  This EUA will remain in effect (meaning this test can be used) for the duration of the COVID-19 declaration under Section 564(b)(1) of the Act, 21 U.S.C. section 360bbb-3(b)(1), unless the authorization is terminated or revoked sooner. Performed at [facility], [place] [address], [place], [place] [address]      Radiology Studies: No results found. Scheduled Meds: . aspirin EC  81 mg Oral Daily  . atorvastatin  40 mg Oral q1800  . Chlorhexidine Gluconate Cloth  6 each Topical Daily  . clindamycin  450 mg Oral Q8H  . digoxin  0.125 mg Oral Daily  . furosemide  40 mg Intravenous BID  . [START ON 12/30/2018] furosemide  40 mg Oral Daily  . guaiFENesin  1,200 mg Oral BID  . mometasone-formoterol  2 puff Inhalation BID  . nicotine  14 mg Transdermal Daily  . spironolactone  12.5 mg Oral Daily  . warfarin  5 mg Oral ONCE-1800  . Warfarin - Pharmacist Dosing Inpatient   Does not apply q1800   Continuous Infusions:   LOS: 4 days  Kerney Elbe, DO [facility] PAGER is on AMION  If 7PM-7AM, please contact night-coverage [email or web address] Password [email or web address] 12/29/2018, 4:12 PM

## 2018-12-29 NOTE — Progress Notes (Signed)
Progress Note  Patient Name: Mark [name] Date of Encounter: 12/29/2018  Primary Cardiologist: Verne Carrow, MD   Subjective   He received fentanyl for tooth ache and is sleepy.  Inpatient Medications    Scheduled Meds: . aspirin EC  81 mg Oral Daily  . atorvastatin  40 mg Oral q1800  . Chlorhexidine Gluconate Cloth  6 each Topical Daily  . clindamycin  450 mg Oral Q8H  . digoxin  0.125 mg Oral Daily  . furosemide  40 mg Intravenous BID  . guaiFENesin  1,200 mg Oral BID  . mometasone-formoterol  2 puff Inhalation BID  . nicotine  14 mg Transdermal Daily  . Warfarin - Pharmacist Dosing Inpatient   Does not apply q1800   Continuous Infusions:  PRN Meds: acetaminophen **OR** acetaminophen, albuterol, diphenhydrAMINE   Vital Signs    Vitals:   12/29/18 0500 12/29/18 0540 12/29/18 0600 12/29/18 0840  BP: 90/72  113/84   Pulse:      Resp: (!) 24  (!) 21   Temp:    (!) 97.5 F (36.4 C)  TempSrc:    Oral  SpO2: 99% 99% 100%   Weight:      Height:        Intake/Output Summary (Last 24 hours) at 12/29/2018 0908 Last data filed at 12/29/2018 0042 Gross per 24 hour  Intake 481.23 ml  Output 775 ml  Net -293.77 ml   Last 3 Weights 12/28/2018 12/27/2018 12/26/2018  Weight (lbs) 158 lb 11.7 oz 152 lb 8.9 oz 164 lb 3.9 oz  Weight (kg) 72 kg 69.2 kg 74.5 kg     Telemetry    SR, runs of nsVT up to 9 beats - Personally Reviewed  ECG    No new - Personally Reviewed  Physical Exam   GEN: No acute distress.   Neck: +2 cm JVD Cardiac: RRR, 4/6 systolic murmurs, + S4, or gallops.  Respiratory: clear to auscultation bilaterally. GI: Soft, nontender, non-distended  MS: + 1 edema B/L up to knees; No deformity. Neuro:  Nonfocal  Psych: Normal affect   Labs    Chemistry Recent Labs  Lab 12/26/18 0417 12/27/18 0526 12/28/18 0255  NA 133* 131* 131*  K 3.9 3.6 3.8  CL 95* 89* 86*  CO2 27 31 34*  GLUCOSE 123* 124* 90  BUN 37* 29* 29*  CREATININE 1.26* 1.15  1.20  CALCIUM 8.5* 8.6* 8.4*  PROT 6.5 6.8 6.7  ALBUMIN 2.8* 2.7* 2.6*  AST 44* 37 36  ALT 25 25 23   ALKPHOS 91 91 92  BILITOT 2.5* 2.2* 1.4*  GFRNONAA >60 >60 >60  GFRAA >60 >60 >60  ANIONGAP 11 11 11     Hematology Recent Labs  Lab 12/27/18 0526 12/28/18 0255 12/29/18 0225  WBC 14.1* 13.2* 10.2  RBC 4.81  4.81 4.46 4.71  HGB 12.6* 11.6* 12.1*  HCT 39.0 35.7* 37.3*  MCV 81.1 80.0 79.2*  MCH 26.2 26.0 25.7*  MCHC 32.3 32.5 32.4  RDW 18.5* 18.1* 17.5*  PLT 153 155 168   Cardiac Enzymes Recent Labs  Lab 12/24/18 2237 12/26/18 0417 12/26/18 0924  TROPONINI 0.08* 0.07* 0.06*   No results for input(s): TROPIPOC in the last 168 hours.   BNP Recent Labs  Lab 12/24/18 2237 12/28/18 0251  BNP 3,973.6* 848.5*    DDimer No results for input(s): DDIMER in the last 168 hours.   Radiology    No results found. Cardiac Studies   TTE; 12/25/18 Moderate LV dilatation  and severe systolic dysfunction with LVEF 5-10% and diffuse hypokinesis. 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 Grade 2 diastolic dysfunction with severely elevated filling pressures.   Right ventricle is severely dilated and now with severe systolic dysfunction (previously mild to moderate). RVSP is 42 mmHg consistent with moderate pulmonary hypertension, however underestimated in teh settings of severe RV dysfunction. RVSP previously 56 mmHg.   Moderate to severe mitral and severe tricuspid regurgitation.   Patient Profile     43 y.o. male   Assessment & Plan    1. Acute on Chronic systolic CHF, Echo 09/2017 LVEF 5-10%, severe RV systolic dysfunction - NICM by cath 2017.Suspect etiology is polysubstance abuse - NYHAIV, severe biventricular failure, given long term non-compliance, ongoing cocaine use the patient is not a candidate for any advanced therapies such as inotrope infusion or  LVAD/transplant down the road. - palliative care was consulted, he is in denial about his  condition and is not ready - his BP has improved, he was able to receive diuretics and is significantly improved, weight down to 67 kg = 147 lbs, that is his baseline - we will transition to his home meds, start mobilizing and if tolerated, discharge in the next 24-48 hours - switch lasix to 40 mg PO daily starting tomorrow and spironolactone 12.5 mg po daily starting today  - BNP 3973 - Crea 1.28->1.26->1.15 - baseline weight 147 lbs, on admission 161, now down to 152 lbs - he has not been taking any meds at home  2. LV thrombus - h/o CVA - we started coumadin given mobile LV thrombus and h/o CVA. If he become a palliative care patient this should be discontinued.  3. sVT - 11 beats today -unable to add BB sec to hypotension - not candidate for ICD given non-compliance and polysubstance abuse  4. Polysubstance abuse - UDS positive for cocaine  4. 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 There is associated slow/absent flow within the associated right M2 branch. - No lasting deficit.  - He is on ASA and stain alone with poor prognosis. He has not been taking statin. - Likely cardio-embolic. Ideally would be on warfarin but not compliant enough at this point. Reconsider in future if compliance improves.  For questions or updates, please contact [facility] Please consult [email or web address] for contact info under     Signed, Tobias Alexander, MD  12/29/2018, 9:08 AM

## 2018-12-29 NOTE — Progress Notes (Signed)
Patient had refused coumadin dose on dayshift. This RN offered coumadin at nightly med pass. Patient stated that he did not like the way coumadin made him feel. He stated he felt "chest tightness" following coumadin administration. This RN attempted to educate patient on the importance of coumadin administration. Patient continued to refuse coumadin. Will continue to closely monitor patient.

## 2018-12-29 NOTE — Evaluation (Signed)
Physical Therapy Evaluation Patient Details Name: Mark [name] MRN: [id number] DOB: May 27, 1976 Today's Date: 12/29/2018   History of Present Illness  Pt admitted with acute on chronic CHF and acute resp failure.  Pt with hx of substance abuse, CVA, asthma, CHF and NICM  Clinical Impression  Pt admitted as above and currently demonstrating ability to perform all basic mobility tasks including ambulation at Mod I/IND level.  Pt ambulating with O2 sats maintained above 90% on RA.  Pt with no PT needs at this time - discussed with RN for nursing to continue ambulation in hall.  PT will sign off at this time.    Follow Up Recommendations No PT follow up    Equipment Recommendations  None recommended by PT    Recommendations for Other Services       Precautions / Restrictions Restrictions Weight Bearing Restrictions: No      Mobility  Bed Mobility Overal bed mobility: Modified Independent             General bed mobility comments: Pt unassisted to/from bed  Transfers Overall transfer level: Modified independent Equipment used: None             General transfer comment: min cues for use of UEs to self assist but no physical assist   Ambulation/Gait Ambulation/Gait assistance: Min guard;Supervision;Independent Gait Distance (Feet): 800 Feet Assistive device: None Gait Pattern/deviations: Step-through pattern;Shuffle;Wide base of support Gait velocity: decr pace for age   General Gait Details: Pt with slightly increased BOS and ambulating at decreased pace but no instability or LOB noted.  Pt maintained SaO2 above 90% throughout  Stairs            Wheelchair Mobility    Modified Rankin (Stroke Patients Only)       Balance Overall balance assessment: Independent;Mild deficits observed, not formally tested                                           Pertinent Vitals/Pain Pain Assessment: No/denies pain    Home Living Family/patient  expects to be discharged to:: Private residence Living Arrangements: Non-relatives/Friends Available Help at Discharge: Family;Friend(s) Type of Home: House Home Access: Stairs to enter Entrance Stairs-Rails: None Entrance Stairs-Number of Steps: 2 Home Layout: One level Home Equipment: None Additional Comments: lives with fiance    Prior Function Level of Independence: Independent               Hand Dominance        Extremity/Trunk Assessment   Upper Extremity Assessment Upper Extremity Assessment: Overall WFL for tasks assessed    Lower Extremity Assessment Lower Extremity Assessment: Overall WFL for tasks assessed       Communication   Communication: Other (comment)(mumbles)  Cognition Arousal/Alertness: Awake/alert Behavior During Therapy: WFL for tasks assessed/performed;Flat affect Overall Cognitive Status: Within Functional Limits for tasks assessed                                        General Comments      Exercises     Assessment/Plan    PT Assessment Patient needs continued PT services  PT Problem List Decreased activity tolerance       PT Treatment Interventions Gait training;Functional mobility training    PT Goals (Current goals can  be found in the Care Plan section)  Acute Rehab PT Goals Patient Stated Goal: HOME PT Goal Formulation: All assessment and education complete, DC therapy    Frequency Min 1X/week   Barriers to discharge        Co-evaluation               AM-PAC PT "6 Clicks" Mobility  Outcome Measure Help needed turning from your back to your side while in a flat bed without using bedrails?: None Help needed moving from lying on your back to sitting on the side of a flat bed without using bedrails?: None Help needed moving to and from a bed to a chair (including a wheelchair)?: None Help needed standing up from a chair using your arms (e.g., wheelchair or bedside chair)?: None Help needed to  walk in hospital room?: None Help needed climbing 3-5 steps with a railing? : None 6 Click Score: 24    End of Session Equipment Utilized During Treatment: Gait belt Activity Tolerance: Patient tolerated treatment well Patient left: in bed;with call bell/phone within reach;with bed alarm set Nurse Communication: Mobility status PT Visit Diagnosis: Difficulty in walking, not elsewhere classified (R26.2)    Time: 1425-1440 PT Time Calculation (min) (ACUTE ONLY): 15 min   Charges:   PT Evaluation $PT Eval Low Complexity: 1 Low          Mauro Kaufmann PT Acute Rehabilitation Services Pager [phone] Office [phone]   Hessie Varone 12/29/2018, 4:27 PM

## 2018-12-29 NOTE — Progress Notes (Addendum)
ANTICOAGULATION CONSULT NOTE - Initial Consult  Pharmacy Consult for warfarin  Indication: LV thrombus  Allergies  Allergen Reactions  . Hydrocodone Hives  . Lisinopril Swelling and Other (See Comments)    Facial swelling/angioedema  . Prednisone Shortness Of Breath, Nausea Only, Swelling and Other (See Comments)    Also made chest feel tight and genital area, legs, and face became swollen badly  . Penicillins Hives and Swelling     Has patient had a PCN reaction causing immediate rash, facial/tongue/throat swelling, SOB or lightheadedness with hypotension: Yes Has patient had a PCN reaction causing severe rash involving mucus membranes or skin necrosis: No Has patient had a PCN reaction that required hospitalization: No Has patient had a PCN reaction occurring within the last 10 years: No If all of the above answers are "NO", then may proceed with Cephalosporin use.     Patient Measurements: Height: 5\' 11"  (180.3 cm) Weight: 158 lb 11.7 oz (72 kg) IBW/kg (Calculated) : 75.3 Heparin Dosing Weight = TBW = 72 kg  Vital Signs: Temp: 98 F (36.7 C) (05/09 1236) Temp Source: Oral (05/09 1236) BP: 110/84 (05/09 1300)  Labs: Recent Labs    12/26/18 2215  12/27/18 0526 12/28/18 0255 12/28/18 0512 12/29/18 0225 12/29/18 0851  HGB  --    < > 12.6* 11.6*  --  12.1*  --   HCT  --   --  39.0 35.7*  --  37.3*  --   PLT  --   --  153 155  --  168  --   LABPROT  --   --  25.0* 26.5*  --  18.7*  --   INR  --   --  2.3* 2.5*  --  1.6*  --   HEPARINUNFRC 0.54  --  0.61  --  0.43  --   --   CREATININE  --   --  1.15 1.20  --   --  1.17   < > = values in this interval not displayed.    Estimated Creatinine Clearance: 82.1 mL/min (by C-G formula based on SCr of 1.17 mg/dL).   Medical History: Past Medical History:  Diagnosis Date  . Asthma   . Chronic systolic CHF (congestive heart failure) (HCC)   . Cigarette smoker   . CKD (chronic kidney disease), stage II    [name]  10/01/2017  . History of echocardiogram    a. Echo 5/17 - EF 20-25%, severe diff HK, restrictive physiology, mild to mod MR, severe reduced RVSF, mod RVE, mild RAE, mod TR, PASP 48 mmHg  . Hx of cardiac cath    a. LHC 5/17 - normal coronary arteries. PA 45/25, mean 33, PCWP mean 18  . NICM (nonischemic cardiomyopathy) (HCC)   . Stroke (HCC) 09/27/2017   "was weak on my left side; I'm fully recovered" (11/09/2017)  . Substance abuse (HCC)    cocaine, marijuana   Assessment: Patient is a 43 year old male admitted with SOB. PMH significant for HF, CVA in 2019, asthma, and substance abuse. Pt has history of medication non-compliance. Cardiology following. Diagnosed with LV thrombus and being started on warfarin with heparin bridge. Pt was not on anticoagulant prior to admission.  Baseline labs: -Hgb 13.4 -Plt 174 -INR: 1.6, elevated at baseline -aPTT: 29  Today, 12/29/18  Hgb 12.1 - slightly low but stable  Plt 168 - WNL, stable  INR 1.6 subtherapeutic and decreased  No significant DDI with warfarin. Of note, patient is on ASA  81 mg PO daily.  Per discussion with MD, will give increased warfarin dose this evening. No heparin bridge at this time.   Goal of Therapy:  INR 2-3 Monitor platelets by anticoagulation protocol: Yes   Plan:   Warfarin 5 mg PO once now. Ok to give prior to 1800  INR daily  CBC with AM labs tomorrow  Monitor for signs/symptoms of bleeding or thrombosis  Patient will need warfarin education and counseling prior to discharge  Cindi Carbon, PharmD 12/29/2018,2:34 PM

## 2018-12-30 DIAGNOSIS — I5043 Acute on chronic combined systolic (congestive) and diastolic (congestive) heart failure: Principal | ICD-10-CM

## 2018-12-30 LAB — CBC WITH DIFFERENTIAL/PLATELET
Abs Immature Granulocytes: 0.04 10*3/uL (ref 0.00–0.07)
Basophils Absolute: 0 10*3/uL (ref 0.0–0.1)
Basophils Relative: 0 %
Eosinophils Absolute: 0 10*3/uL (ref 0.0–0.5)
Eosinophils Relative: 0 %
HCT: 38.1 % — ABNORMAL LOW (ref 39.0–52.0)
Hemoglobin: 12.6 g/dL — ABNORMAL LOW (ref 13.0–17.0)
Immature Granulocytes: 1 %
Lymphocytes Relative: 17 %
Lymphs Abs: 1.4 10*3/uL (ref 0.7–4.0)
MCH: 26 pg (ref 26.0–34.0)
MCHC: 33.1 g/dL (ref 30.0–36.0)
MCV: 78.7 fL — ABNORMAL LOW (ref 80.0–100.0)
Monocytes Absolute: 1.1 10*3/uL — ABNORMAL HIGH (ref 0.1–1.0)
Monocytes Relative: 14 %
Neutro Abs: 5.7 10*3/uL (ref 1.7–7.7)
Neutrophils Relative %: 68 %
Platelets: 207 10*3/uL (ref 150–400)
RBC: 4.84 MIL/uL (ref 4.22–5.81)
RDW: 17.3 % — ABNORMAL HIGH (ref 11.5–15.5)
WBC: 8.3 10*3/uL (ref 4.0–10.5)
nRBC: 0.5 % — ABNORMAL HIGH (ref 0.0–0.2)

## 2018-12-30 LAB — COMPREHENSIVE METABOLIC PANEL
ALT: 25 U/L (ref 0–44)
AST: 36 U/L (ref 15–41)
Albumin: 2.8 g/dL — ABNORMAL LOW (ref 3.5–5.0)
Alkaline Phosphatase: 127 U/L — ABNORMAL HIGH (ref 38–126)
Anion gap: 11 (ref 5–15)
BUN: 30 mg/dL — ABNORMAL HIGH (ref 6–20)
CO2: 31 mmol/L (ref 22–32)
Calcium: 8.7 mg/dL — ABNORMAL LOW (ref 8.9–10.3)
Chloride: 86 mmol/L — ABNORMAL LOW (ref 98–111)
Creatinine, Ser: 1.07 mg/dL (ref 0.61–1.24)
GFR calc Af Amer: >60 mL/min (ref >60)
GFR calc non Af Amer: >60 mL/min (ref >60)
Glucose, Bld: 135 mg/dL — ABNORMAL HIGH (ref 70–99)
Potassium: 3.8 mmol/L (ref 3.5–5.1)
Sodium: 128 mmol/L — ABNORMAL LOW (ref 135–145)
Total Bilirubin: 1.8 mg/dL — ABNORMAL HIGH (ref 0.3–1.2)
Total Protein: 6.9 g/dL (ref 6.5–8.1)

## 2018-12-30 LAB — PROTIME-INR
INR: 1.2 (ref 0.8–1.2)
Prothrombin Time: 15.5 seconds — ABNORMAL HIGH (ref 11.4–15.2)

## 2018-12-30 LAB — MAGNESIUM: Magnesium: 1.9 mg/dL (ref 1.7–2.4)

## 2018-12-30 LAB — PHOSPHORUS: Phosphorus: 3 mg/dL (ref 2.5–4.6)

## 2018-12-30 MED ORDER — LOSARTAN POTASSIUM 25 MG PO TABS: 25.0000 mg | ORAL_TABLET | Freq: Every day | ORAL | 5 refills | Status: DC

## 2018-12-30 MED ORDER — ATORVASTATIN CALCIUM 40 MG PO TABS: 40.0000 mg | ORAL_TABLET | Freq: Every day | ORAL | 0 refills | Status: DC

## 2018-12-30 MED ORDER — ALBUTEROL SULFATE HFA 108 (90 BASE) MCG/ACT IN AERS: 1.0000 | INHALATION_SPRAY | RESPIRATORY_TRACT | 4 refills | Status: DC | PRN

## 2018-12-30 MED ORDER — FUROSEMIDE 40 MG PO TABS: 80.0000 mg | ORAL_TABLET | Freq: Every day | ORAL | Status: DC

## 2018-12-30 MED ORDER — FUROSEMIDE 80 MG PO TABS: 80.0000 mg | ORAL_TABLET | Freq: Every day | ORAL | 0 refills | Status: DC

## 2018-12-30 MED ORDER — SPIRONOLACTONE 25 MG PO TABS: 12.5000 mg | ORAL_TABLET | Freq: Every day | ORAL | 0 refills | Status: DC

## 2018-12-30 MED ORDER — NICOTINE 14 MG/24HR TD PT24: 14.0000 mg | MEDICATED_PATCH | Freq: Every day | TRANSDERMAL | 0 refills | Status: DC

## 2018-12-30 MED ORDER — CLINDAMYCIN HCL 150 MG PO CAPS: 450.0000 mg | ORAL_CAPSULE | Freq: Three times a day (TID) | ORAL | 0 refills | Status: DC

## 2018-12-30 MED ORDER — ASPIRIN 81 MG PO TBEC: 81.0000 mg | DELAYED_RELEASE_TABLET | Freq: Every day | ORAL | 4 refills | Status: DC

## 2018-12-30 MED ORDER — DIGOXIN 125 MCG PO TABS: 0.1250 mg | ORAL_TABLET | Freq: Every day | ORAL | 5 refills | Status: DC

## 2018-12-30 MED ORDER — CLINDAMYCIN HCL 150 MG PO CAPS: 450.0000 mg | ORAL_CAPSULE | Freq: Three times a day (TID) | ORAL | 0 refills | Status: AC

## 2018-12-30 MED ORDER — BUDESONIDE 0.25 MG/2ML IN SUSP: 0.2500 mg | Freq: Two times a day (BID) | RESPIRATORY_TRACT | Status: DC

## 2018-12-30 MED ORDER — DIGOXIN 125 MCG PO TABS: 0.1250 mg | ORAL_TABLET | Freq: Every day | ORAL | 5 refills | Status: AC

## 2018-12-30 MED ORDER — ENOXAPARIN SODIUM 80 MG/0.8ML ~~LOC~~ SOLN
1.0000 mg/kg | Freq: Two times a day (BID) | SUBCUTANEOUS | Status: DC
  Filled 2018-12-30: qty 0.7

## 2018-12-30 MED ORDER — WARFARIN SODIUM 5 MG PO TABS: 5.0000 mg | ORAL_TABLET | Freq: Once | ORAL | Status: DC

## 2018-12-30 NOTE — Discharge Summary (Signed)
Physician Discharge Summary  Mark [name] QBV:[id number] DOB: 09/29/1975 DOA: 12/24/2018  PCP: Dorena Dew, FNP  Admit date: 12/24/2018 Discharge date: 12/30/2018  Admitted From: Home Disposition: Home  Recommendations for Outpatient Follow-up:  1. Follow up with PCP in 1-2 weeks 2. Follow up with Cardiology in 1-2 weeks 3. Please obtain CMP/CBC, Mag, Phos in one week 4. Please follow up on the following pending results:  Home Health: No Equipment/Devices: None  Discharge Condition: Guarded CODE STATUS: FULL CODE Diet recommendation: Heart Healthy Diabetic Diet with 1200 mL Fluid Restriction  Brief/Interim Summary: HPI per Dr. Zada Finders on 12/25/2018 Mark [name] a 43 y.o.malewith medical history significant forchronic systolic CHF (EF 28-00% with severe diffuse hypokinesis by TTE 09/28/2017),NICM by cardiac cath 2017, history of right MCA CVA 2019, asthma, and substance use whopresents to the ED for progressive shortness of breath. Patient states he is running low on his home Lasix which he has been cutting in half in order to extend the duration of his medications. He ran out completely about 4 days ago. He has had progressive swelling of both of his legs as well as shortness of breath and orthopnea. He denies any chest pain, cough, fever, dysuria.  He for smoking with 1 pack of cigarettes lasting about 3-4 days. He denies a significant alcohol use. He admits to occasional cocaine use and says last use was about 2 weeks ago.  ED Course: Initial vitals showed BP 114/94, pulse 110, RR 25, temp 97.6 Fahrenheit, SPO2 100% on nonrebreather.  Labs notable for WBC 6.7, hemoglobin 12.9, platelets 189, potassium 4.3, BUN 31, creatinine 1.15, troponin I 0.08, BNP 3973.6, digoxin level 0.5, SARS-CoV-2 testing negative.  Portable chest x-ray showed enlarged cardiac silhouette without focal consolidation, pleural effusion or interstitial edema.  Patient was given IV  Lasix 40 mg x 1 and placed on BiPAP with improvement in respiratory status. The hospital service was consulted to admit for further evaluation and management.  **Interim History The patient was weaned off of BiPAP and cardiology consulted and patient is currently being diuresed.  Patient was also started on a heparin drip with a warfarin bridge for his left ventricular thrombus and Heparin gtt has now stopped.  Per cardiology he is not a candidate for ICD given his noncompliance and polysubstance abuse and they are worried about him being on Coumadin given his noncompliance will continue at this time and per them is not a candidate for advanced therapies but may be if he remains off substances. He is hypotensive yesterday and felt he was gone into cardiogenic shock so cardiology held some of his medications as well as decreased his diuresis.  I give the patient to 50 mL bolus and patient was looking better today so cardiology is adding back to medications.  Per Dr. Meda Coffee he is not a candidate for inotropic therapy infusion or LVAD/transport down the road.  Dr. Meda Coffee recommended palliative care consultation and Dr. Domingo Cocking met with the patient yesterday and patient is in denial about his condition and not ready to accept terms.  Since his blood pressure is improved today he is able to receive diuretics and he is significantly improved and per cardiology is back to his baseline.    He was transitioned to his home meds and start mobilizing if tolerated and have recommended physical therapy evaluation and he did not need any home health.Marland Kitchen    Hospitalization has been complicated by patient's dental pain and he feels that this is his  primary complaint.  I tried to get in touch with the in-house dentist and was unsuccessful so patient will follow-up as an outpatient.  I had a lengthy discussion about patient's anticoagulation as he has been refusing his Coumadin patient states that he will not take anymore  anticoagulation nor will he bridge himself with heparin as he states that the Coumadin "makes his heart flutter".  I discussed with cardiology and she states that it is okay to hold off on Coumadin for now as he is noncompliant and patient will need to follow-up with cardiology in outpatient setting.  Patient also stated that he did not want his clindamycin however I discussed this with him and patient will go home on clindamycin for 3 more days and he states he may or may not take it.  Since patient symptoms improved he was deemed stable to be discharged no need to follow-up with PCP as well as cardiology.  It was stressed to him that he needs to stay away from cocaine as his heart function is significantly reduced.  I discussed the patient has a high risk of cessation patient understands these and he is in denial.  Palliative care has signed off the case.  Patient will go home and his medications have been renewed however it is unclear if he will remain compliant or not.  He was deemed stable for discharge and has refused his Coumadin and Coumadin bridging as his INR is subtherapeutic.  Discharge Diagnoses:  Principal Problem:   Acute on chronic systolic congestive heart failure (HCC) Active Problems:   Asthma   Elevated troponin I level   History of right MCA stroke   Demand ischemia (HCC)   LV (left ventricular) mural thrombus without MI   SOB (shortness of breath)   Cocaine use  Acute Respiratory Failure with Hypoxia in the setting of acute CHF exacerbation requiring noninvasive positive pressure ventilation with BiPAP, improved -Improved but was still complaining of some SOB -Patient was placed on BiPAP initially and now been transitioned off -Currently saturating on room air; SARS-CoV-2 Negative  -Repeat CXR on 12/27/2018 showed "Stable mild cardiomegaly. Normal mediastinal contours. Normal pulmonary vascularity. No focal consolidation, pleural effusion, or pneumothorax. No acute osseous  abnormality." -Continue Diuresis per cardiology as below -Repeat CXR showed no Active Disease  Acute on Chronic Combined Systolic and Diastolic CHF exacerbation with recent echo 5 to 10% and severe right ventricular systolic dysfunction and Grade 2 DD now complicated by Cardiogenic Shock, improving  -EF 10-15% by echocardiogram February 2019,NICM by cardiac cath 2017.  -BNP on Admission was 3,973.6 and has improved to 848.5 -Patient had significant volume overload on exam with improvement in respiratory symptoms on BiPAP. -Admit to stepdown unit for as needed BiPAP and now of off of it but upgraded to ICU Status because of Frequent Monitoring  -IV Lasix 40 mg twice daily changed to q6h by Cardiology but this was cut back to BID given Hypotension Dr. Meda Coffee recommending continuing pain transitioning to home medications tomorrow.  Dr. Meda Coffee placed the patient on 80 mg p.o. Lasix and he will be discharged with this -Losartan 25 mg po qHS and Spironolactone 25 mg po Daily stopped due to Hypotension  day before yesterday but Dr. Meda Coffee resumed his spironolactone at 12.5 mg p.o. daily today will continue at discharge -I have resumed his losartan but he will need to discuss with cardiology when to take -C/w Digoxin 0.125 mg po Daily -Strict I's/O's and Daily Weights; Patient's weight is fluctuating  and Up 6 lbs since yesterday and is now 158 -Baseline Weight is 147 lbs per Cardiology and weight was not done today but per cardiology he is at his baseline -He is -10,100  mL since admission -Will place on Heart Healthy Diet and Fluid Restrict to 1500 mL -Repeat Echocardiogram this visit showed moderate LV dilatation and severe systolic dysfunction with LVEF of 5 to 10% and diffuse hypokinesis.  There is also new large apical thrombus measuring 2.7 x 1.6 cm and grade 2 diastolic dysfunction with severely elevated filling pressures -Dr. Meda Coffee currently not a candidate for advanced therapies and no  indication for ICD now as he has Significant Substance Abuse -Dr. Meda Coffee evaluated and per her note and he has a NYHA class IV and says the patient was hypotensive today she felt he was in cardiogenic shock with severe biventricular failure and diuretics were held this morning secondary to hypotension but they have been reduced to twice daily dosing.  States weight is going up and given his long-term noncompliance and ongoing cocaine use she states the patient not a candidate for advanced therapies such as inotrope infusion or LVAD and transplant down the road and recommends against Milrinone drip -Palliative care consult for goals of care discussion I discussed with Dr. Domingo Cocking who will see the patient in consultation today -Appreciate Dr. Domingo Cocking seen the patient  and patient is in denial about how severe his symptoms are and how significantly reduced his EF is.  Patient does not want further palliative discussions -Patient will go home and will need to follow-up with PCP and cardiology in outpatient setting  Elevated Troponin Level -Mild troponin elevation of 0.08, likely due to demand ischemia.  -He has chronically elevated troponin on review of labs.  -We will continue to trend and trending down as went from 0.08 -> 0.07 -> 0.06 -Continue to Monitor for Chest Pain as an outpatient  History of Right MCA Stroke -No residual deficit. -Had an MRI of Brain on 09/28/2017 which showed moderate to large right MCA distribution infarct. There was also associated slow/absent flow within the associated right M2 branch -Suspected to be cardioembolic, however he was not started on anticoagulation due to his history of nonadherence to medication as an outpatient -He has been on Aspirin 81 mg daily. He was on statin in the past but does not appear to be taking it. -Continue Aspirin 81 mg Daily -Start Atorvastatin 40 mg Daily -Per Cardiology Ideally needs to be on Warfarin but not compliant enough per them  but he was started on Coumadin this admission and INR became therapeutic with and patient subsequently refused Coumadin doses and cardiology states just to hold off for now as he is noncompliant.  Asthma -Not obviously wheezing on examination.  -Respiratory symptoms more consistent with CHF exacerbation. -Continue Dulera 2 puff IH BID and Albuterol 2.5 mg Neb q4hprn Wheezing and SOB  Substance Use Disorder/Tobacco use: -Patient acknowledges he needs to quit tobacco. Smoking Cessation Counseling given again  -He admits to cocaine use with last use about 2 weeks ago. -C/w Nicotine Patch 14 mg TD q24h -Obtained UDS and was + for Cocaine   Lactic Acidosis -Improved. Lactic Acid Level went from 2.1 -> 1.4 -Will not continue to Trend   Hyponatremia -Patient's Na+ went from 136 -> 133 -> 131 -> 128 -In the Setting of Hypervolemic Hyponatremia but now likley 2/2 to Diuresis  -C/w Diuresis per Cardiology -Repeat CMP in AM  Leukocytosis in the Setting of Dental  Infection, improving -WBC went from 6.7 -> 9.0 -> 21.7 -> 14.1 -> 13.2 -> 10.2 -> 8.3 -? Reactive and in the setting of Diuresis but now likely source is mouth as patient has swelling and pain in Jaw and specifically left side of face -Patient is Currently Afebrile with a Tmax of 98.5 in the last 24 hours -Wanted to obtain a DG Orthopantogram but no longer available at [facility] -Instead ordered CT Maxillofacial and showed "No acute bony abnormality is noted. Soft tissue swelling is seen with some subcutaneous air consistent with focal inflammatory change. Multiple dental caries are noted. Inflammatory changes in the paranasal sinuses with air-fluid level in the left maxillary antrum consistent with acute sinusitis." -Patient was started on Clindamycin IV given PCN Allergy and will transition to po Abx -Have attempted to reach our In-house Dentist Dr. Enrique Sack for evaluation but no response and left a message. Called on Call  Dentist Dr. Mariel Sleet and no response   but patient states he will follow-up with dentistry in the outpatient setting -I transition the patient to p.o. clindamycin however patient refused it during his hospitalization and is unsure if he will take it or not.  I have discharged him with 3 more days to complete course and is on the patient to see if he will take it or not. -Continue to Monitor for S/Sx of Infection -Repeat CBC as an outpatient.  Normocytic Anemia  -Patient's hemoglobin/hematocrit went from 12.9/41.1 -> 13.4/42.8 -> 12.8/37.6 -> 12.6/39.0 -> 11.6/35.7 -> 12.1/37.3 -> 12.6/38.1 -Continue monitor for signs and symptoms of bleeding as patient not anticoagulated with heparin drip as well as Coumadin -Checked Anemia Panel and showed iron level 14, U IBC of 287, TIBC of 301, saturation ratios of 5%, ferritin level 131, folate level 7.3, and vitamin B12 of 718 -Repeat as an outpatient   Hypophosphatemia -Patient's phosphorus levels morning was 2.3 and improved to 3.0 -Continue to monitor replete as necessary  Hypomagnesemia -Patient's Mag Level was 1.5 and improved to 1.9 -Continue to Monitor and Replete as Necessary -Repeat Mag Level as an outpatient   Renal Insuffiencey -Patient's BUN/creatinine went from 31/1.15 ->  37/1.26 -> 29/1.15 -> 29/1.20 -> 32/1.17 -> 30/1.07 -In the setting of diuresis -Continue monitor and trend renal function -Avoid nephrotoxic medications if possible as well as contrast dyes and hypotension -Repeat CMP as an outpatient   Left Ventricle Thrombus -Patient is now on Warfarin with Heparin gtt and Heparin gtt to be stopped toady  -INR was 1.2  this AM; Pharmacy adjusting and patient to get 7.5 mg tonight but patient has been refusing his Coumadin and is noncompliant -I had a lengthy discussion with the patient about Coumadin and he states that he will not take it even if he goes home and does not want bridging with warfarin.  Patient states that the  Coumadin makes him feel like his heart is fluttering.  Per cardiology okay to hold off Coumadin now as he is not compliant and will reconsider in the outpatient setting -Will need close outpatient Monitoring if this is continued and if patient becomes more compliant   Hyperglycemia in the setting of New Onset Diabetes -Patient's Blood Sugar has been ranging from 90-135 -Checked HbA1c and was 6.8; Last HbA1c was 6.4 -Continue to Monitor Blood Sugars Carefully -IF necessary will place on Sensitive Novolog SSI  Hyperbilirubinemia -Likely Reactive -T Bili was 2.4 and trended down to 1.4 but is now trending back up to 1.8 -Continue to Monitor and  Trend -Repeat CMP in AM   Elevated AST -In the setting of Volume Overload -Trending Down as went from 55 -> 44 -> 37 -> 36 -> 33 -> 36 -Continue to Monitor and Trend Closely  -Repeat CMP in AM   Discharge Instructions  Discharge Instructions    (HEART FAILURE PATIENTS) Call MD:  Anytime you have any of the following symptoms: 1) 3 pound weight gain in 24 hours or 5 pounds in 1 week 2) shortness of breath, with or without a dry hacking cough 3) swelling in the hands, feet or stomach 4) if you have to sleep on extra pillows at night in order to breathe.   Complete by:  As directed    Call MD for:  difficulty breathing, headache or visual disturbances   Complete by:  As directed    Call MD for:  extreme fatigue   Complete by:  As directed    Call MD for:  hives   Complete by:  As directed    Call MD for:  persistant dizziness or light-headedness   Complete by:  As directed    Call MD for:  persistant nausea and vomiting   Complete by:  As directed    Call MD for:  redness, tenderness, or signs of infection (pain, swelling, redness, odor or green/yellow discharge around incision site)   Complete by:  As directed    Call MD for:  severe uncontrolled pain   Complete by:  As directed    Call MD for:  temperature >100.4   Complete by:  As  directed    Diet - low sodium heart healthy   Complete by:  As directed    Discharge instructions   Complete by:  As directed    You were cared for by a hospitalist during your hospital stay. If you have any questions about your discharge medications or the care you received while you were in the hospital after you are discharged, you can call the unit and ask to speak with the hospitalist on call if the hospitalist that took care of you is not available. Once you are discharged, your primary care physician will handle any further medical issues. Please note that NO REFILLS for any discharge medications will be authorized once you are discharged, as it is imperative that you return to your primary care physician (or establish a relationship with a primary care physician if you do not have one) for your aftercare needs so that they can reassess your need for medications and monitor your lab values.  Follow up with PCP and Cardiology as an outpatient. Take all medications as prescribed. If symptoms change or worsen please return to the ED for evaluation   Increase activity slowly   Complete by:  As directed      Allergies as of 12/30/2018      Reactions   Hydrocodone Hives   Lisinopril Swelling, Other (See Comments)   Facial swelling/angioedema   Prednisone Shortness Of Breath, Nausea Only, Swelling, Other (See Comments)   Also made chest feel tight and genital area, legs, and face became swollen badly   Penicillins Hives, Swelling   Has patient had a PCN reaction causing immediate rash, facial/tongue/throat swelling, SOB or lightheadedness with hypotension: Yes Has patient had a PCN reaction causing severe rash involving mucus membranes or skin necrosis: No Has patient had a PCN reaction that required hospitalization: No Has patient had a PCN reaction occurring within the last 10 years: No If all  of the above answers are "NO", then may proceed with Cephalosporin use.      Medication List     STOP taking these medications   Dulera 100-5 MCG/ACT Aero Generic drug:  mometasone-formoterol     TAKE these medications   albuterol 108 (90 Base) MCG/ACT inhaler Commonly known as:  VENTOLIN HFA Inhale 1-2 puffs into the lungs every 4 (four) hours as needed for wheezing or shortness of breath.   aspirin 81 MG EC tablet Take 1 tablet (81 mg total) by mouth daily.   atorvastatin 40 MG tablet Commonly known as:  LIPITOR Take 1 tablet (40 mg total) by mouth daily at 6 PM.   budesonide-formoterol 80-4.5 MCG/ACT inhaler Commonly known as:  SYMBICORT Inhale 2 puffs into the lungs 2 (two) times daily.   clindamycin 150 MG capsule Commonly known as:  CLEOCIN Take 3 capsules (450 mg total) by mouth every 8 (eight) hours for 3 days.   digoxin 0.125 MG tablet Commonly known as:  LANOXIN Take 1 tablet (0.125 mg total) by mouth daily.   furosemide 80 MG tablet Commonly known as:  LASIX Take 1 tablet (80 mg total) by mouth daily. Start taking on:  Dec 31, 2018 What changed:  how much to take   losartan 25 MG tablet Commonly known as:  COZAAR Take 1 tablet (25 mg total) by mouth at bedtime. DISCUSS WITH CARDIOLOGY ABOUT WHEN TO START What changed:  additional instructions   multivitamin with minerals Tabs tablet Take 1 tablet by mouth daily.   nicotine 14 mg/24hr patch Commonly known as:  NICODERM CQ - dosed in mg/24 hours Place 1 patch (14 mg total) onto the skin daily. Start taking on:  Dec 31, 2018   spironolactone 25 MG tablet Commonly known as:  ALDACTONE Take 0.5 tablets (12.5 mg total) by mouth daily. Start taking on:  Dec 31, 2018 What changed:  Another medication with the same name was removed. Continue taking this medication, and follow the directions you see here.      Follow-up Information    [facility] Follow up.   Specialty:  Cardiology Why:  You will be called on Monday to arrange for follow up with Dr.  Clayborne Dana office in a week.  Contact information: [address] [id number] [place]       Dorena Dew, FNP. Call.   Specialty:  Family Medicine Why:  Follow up within 1 week Contact information: [place]. [place] [address] [phone]        Burnell Blanks, MD .   Specialty:  Cardiology Contact information: [address] [place] [address] [phone]          Allergies  Allergen Reactions  . Hydrocodone Hives  . Lisinopril Swelling and Other (See Comments)    Facial swelling/angioedema  . Prednisone Shortness Of Breath, Nausea Only, Swelling and Other (See Comments)    Also made chest feel tight and genital area, legs, and face became swollen badly  . Penicillins Hives and Swelling     Has patient had a PCN reaction causing immediate rash, facial/tongue/throat swelling, SOB or lightheadedness with hypotension: Yes Has patient had a PCN reaction causing severe rash involving mucus membranes or skin necrosis: No Has patient had a PCN reaction that required hospitalization: No Has patient had a PCN reaction occurring within the last 10 years: No If all of the above  answers are "NO", then may proceed with Cephalosporin use.    Consultations:  Cardiology  Palliative Care Medicine  Procedures/Studies: Dg Chest Port 1 View  Result Date: 12/27/2018 CLINICAL DATA:  Shortness of breath. EXAM: PORTABLE CHEST 1 VIEW COMPARISON:  Chest x-ray dated Dec 24, 2018. FINDINGS: Stable mild cardiomegaly. Normal mediastinal contours. Normal pulmonary vascularity. No focal consolidation, pleural effusion, or pneumothorax. No acute osseous abnormality. IMPRESSION: No active disease. Electronically Signed   By: Titus Dubin M.D.   On: 12/27/2018 08:00   Dg Chest Port 1 View  Result Date: 12/24/2018 CLINICAL DATA:  CHF EXAM: PORTABLE CHEST 1 VIEW COMPARISON:  11/29/2015 FINDINGS:  Cardiomegaly. No confluent airspace opacities, effusions or edema. No acute bony abnormality. IMPRESSION: Cardiomegaly.  No active disease. Electronically Signed   By: Rolm Baptise M.D.   On: 12/24/2018 22:58   Ct Maxillofacial Wo Contrast  Result Date: 12/26/2018 CLINICAL DATA:  Facial pain and swelling EXAM: CT MAXILLOFACIAL WITHOUT CONTRAST TECHNIQUE: Multidetector CT imaging of the maxillofacial structures was performed. Multiplanar CT image reconstructions were also generated. COMPARISON:  None. FINDINGS: Osseous: No acute fracture is identified. Diffuse dental caries are identified. No periapical abscess is noted. Orbits: Orbits are within normal limits. Sinuses: Paranasal sinuses demonstrate mucosal thickening bilaterally within the maxillary antra. Air-fluid level is noted within the left maxillary antrum consistent with acute sinusitis. Soft tissues: Soft tissue swelling is noted in the left face surrounding the maxilla and mandible. No focal hematoma is noted. Some subcutaneous air is noted along the anterior aspect of the maxilla. This likely represents some localized infection. Limited intracranial: Within normal limits. IMPRESSION: No acute bony abnormality is noted. Soft tissue swelling is seen with some subcutaneous air consistent with focal inflammatory change. Multiple dental caries are noted. Inflammatory changes in the paranasal sinuses with air-fluid level in the left maxillary antrum consistent with acute sinusitis. Electronically Signed   By: Inez Catalina M.D.   On: 12/26/2018 21:35    ECHOCARDIOGRAM IMPRESSIONS   1. The right ventricle has severely reduced systolic function. The cavity was severely enlarged. There is no increase in right ventricular wall thickness. 2. Left atrial size was mildly dilated. 3. Right atrial size was moderately dilated. 4. Severely thickened tricuspid valve leaflets. 5. The mitral valve is grossly normal. Mitral valve regurgitation is moderate  to severe by color flow Doppler. The MR jet is posteriorly-directed. 6. The tricuspid valve is grossly normal. 7. The pulmonic valve was grossly normal. Pulmonic valve regurgitation is moderate is mild by color flow Doppler.  SUMMARY  Moderate LV dilatation and severe systolic dysfunction with LVEF 5-10% and diffuse hypokinesis. 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 Grade 2 diastolic dysfunction with severely elevated filling pressures.  Right ventricle is severely dilated and now with severe systolic dysfunction (previously mild to moderate). RVSP is 42 mmHg consistent with moderate pulmonary hypertension, however underestimated in teh settings of severe RV dysfunction. RVSP previously 56 mmHg.  Moderate to severe mitral and severe tricuspid regurgitation. FINDINGS Left Ventricle: The left ventricle has severely reduced systolic function, wih an ejection fraction of 5-10%. The cavity size was normal. There is no increase in left ventricular wall thickness. Definity contrast agent was given IV to delineate the left ventricular endocardial borders.  Right Ventricle: The right ventricle has severely reduced systolic function. The cavity was severely enlarged. There is no increase in right ventricular wall thickness.  Left Atrium: Left atrial size was mildly dilated.  Right Atrium:  Right atrial size was moderately dilated. Right atrial pressure is estimated at 15 mmHg.  Interatrial Septum: No atrial level shunt detected by color flow Doppler.  Pericardium: There is no evidence of pericardial effusion.  Mitral Valve: The mitral valve is grossly normal. Mitral valve regurgitation is moderate to severe by color flow Doppler. The MR jet is posteriorly-directed.  Tricuspid Valve: The tricuspid valve is grossly normal. Tricuspid valve regurgitation was not visualized by color flow Doppler. The tricuspid valve is severely thickened.  Aortic Valve: The  aortic valve is normal in structure. Aortic valve regurgitation was not assessed by color flow Doppler.  Pulmonic Valve: The pulmonic valve was grossly normal. Pulmonic valve regurgitation is moderate is mild by color flow Doppler.  Venous: The inferior vena cava is normal in size with greater than 50% respiratory variability.   +--------------+--------++ LEFT VENTRICLE  +----------------+---------++ +--------------+--------++ Diastology   PLAX 2D   +----------------+---------++ +--------------+--------++ LV e' lateral: 8.03 cm/s LVIDd: 5.59 cm  +----------------+---------++ +--------------+--------++ LV E/e' lateral:12.3  LVIDs: 5.32 cm  +----------------+---------++ +--------------+--------++ LV e' medial: 3.75 cm/s LV PW: 0.96 cm  +----------------+---------++ +--------------+--------++ LV E/e' medial: 26.4  LV IVS: 0.90 cm  +----------------+---------++ +--------------+--------++ LVOT diam: 2.20 cm  +--------------+--------++ LV SV: 17 ml  +--------------+--------++ LV SV Index: 8.69  +--------------+--------++ LVOT Area: 3.80 cm +--------------+--------++    +--------------+--------++  +------------------+---------++ LV Volumes (MOD)   +------------------+---------++ LV area d, A2C: 59.90 cm +------------------+---------++ LV area d, A4C: 53.00 cm +------------------+---------++ LV area s, A2C: 51.80 cm +------------------+---------++ LV area s, A4C: 47.70 cm +------------------+---------++ LV major d, A2C: 10.80 cm  +------------------+---------++ LV major d, A4C: 10.40 cm  +------------------+---------++ LV major s, A2C: 9.93 cm  +------------------+---------++ LV major s,  A4C: 9.73 cm  +------------------+---------++ LV vol d, MOD A2C:275.0 ml  +------------------+---------++ LV vol d, MOD A4C:222.0 ml  +------------------+---------++ LV vol s, MOD A2C:230.0 ml  +------------------+---------++ LV vol s, MOD A4C:194.0 ml  +------------------+---------++ LV SV MOD A2C: 45.0 ml  +------------------+---------++ LV SV MOD A4C: 222.0 ml  +------------------+---------++ LV SV MOD BP: 39.2 ml  +------------------+---------++  +---------------+---------++ RIGHT VENTRICLE  +---------------+---------++ RV S prime: 8.93 cm/s +---------------+---------++ TAPSE (M-mode):1.3 cm  +---------------+---------++  +-----------+-------++----------++ LEFT ATRIUM Index  +-----------+-------++----------++ LA diam: 4.20 cm2.19 cm/m +-----------+-------++----------++ +------------+-----------++ AORTIC VALVE  +------------+-----------++ LVOT Vmax: 45.00 cm/s  +------------+-----------++ LVOT Vmean: 27.100 cm/s +------------+-----------++ LVOT VTI: 0.048 m  +------------+-----------++  +-------------+-------++ AORTA   +-------------+-------++ Ao Root diam:3.00 cm +-------------+-------++ Ao Asc diam: 2.70 cm +-------------+-------++  +--------------+----------++ +---------------+-----------++ MITRAL VALVE   TRICUSPID VALVE  +--------------+----------++ +---------------+-----------++ MV Area (PHT):5.38 cm  TR Peak grad: 26.0 mmHg  +--------------+----------++ +---------------+-----------++ MV PHT: 40.89 msec TR Vmax: 255.00 cm/s +--------------+----------++ +---------------+-----------++ MV Decel Time:141 msec  +--------------+----------++ +--------------+-------+ +--------------+----------++ SHUNTS   MV E velocity:99.00  cm/s +--------------+-------+ +--------------+----------++ Systemic VTI: 0.05 m  MV A velocity:46.30 cm/s +--------------+-------+ +--------------+----------++ Systemic Diam:2.20 cm MV E/A ratio: 2.14  +--------------+-------+ +--------------+----------++  Subjective: Seen and examined at bedside and states that he was feeling much better and feeling fine.  States his mouth is better as well.  Feels like he has no swelling.  No nausea or vomiting.  Does not want to take Coumadin and does not want to clindamycin but will consider taking clindamycin outpatient.  No other concerns complaints at this time and is ready to go home.  I discussed in depth about cocaine use and his low EF and patient states that he will try to avoid it but it is unclear if he actually.  Will need to follow-up with PCP and cardiology as well as dentistry in outpatient and he states he is understandable and agreeable with the plan of care.  Discharge Exam: Vitals:   12/30/18 0813 12/30/18 1000  BP:  118/83  Pulse:    Resp:  (!) 24  Temp: (!) 97.5 F (36.4 C)   SpO2:  100%   Vitals:   12/30/18 0800 12/30/18 0813 12/30/18 0958 12/30/18 1000  BP: 120/73   118/83  Pulse:      Resp: 18   (!) 24  Temp:  (!) 97.5 F (36.4 C)    TempSrc:  Axillary    SpO2: 100%   100%  Weight:   67.9 kg   Height:       General: Pt is alert, awake, not in acute distress Cardiovascular: RRR, S1/S2 +, has a 3 out of 6 systolic murmur Respiratory: Managed bilaterally, no wheezing, no rhonchi Abdominal: Soft, NT, ND, bowel sounds + Extremities: 1+ edema, no cyanosis  The results of significant diagnostics from this hospitalization (including imaging, microbiology, ancillary and laboratory) are listed below for reference.    Microbiology: Recent Results (from the past 240 hour(s))  SARS Coronavirus 2 (CEPHEID - Performed in [facility] hospital lab), Hosp Order     Status: None   Collection Time: 12/24/18 10:54  PM  Result Value Ref Range Status   SARS Coronavirus 2 NEGATIVE NEGATIVE Final    Comment: (NOTE) If result is NEGATIVE SARS-CoV-2 target nucleic acids are NOT DETECTED. The SARS-CoV-2 RNA is generally detectable in upper and lower  respiratory specimens during the acute phase of infection. The lowest  concentration of SARS-CoV-2 viral copies this assay can detect is 250  copies / mL. A negative result does not preclude SARS-CoV-2 infection  and should not be used as the sole basis for treatment or other  patient management decisions.  A negative result may occur with  improper specimen collection / handling, submission of specimen other  than nasopharyngeal swab, presence of viral mutation(s) within the  areas targeted by this assay, and inadequate number of viral copies  (<250 copies / mL). A negative result must be combined with clinical  observations, patient history, and epidemiological information. If result is POSITIVE SARS-CoV-2 target nucleic acids are DETECTED. The SARS-CoV-2 RNA is generally detectable in upper and lower  respiratory specimens dur ing the acute phase of infection.  Positive  results are indicative of active infection with SARS-CoV-2.  Clinical  correlation with patient history and other diagnostic information is  necessary to determine patient infection status.  Positive results do  not rule out bacterial infection or co-infection with other viruses. If result is PRESUMPTIVE POSTIVE SARS-CoV-2 nucleic acids MAY BE PRESENT.   A presumptive positive result was obtained on the submitted specimen  and confirmed on repeat testing.  While 2019 novel coronavirus  (SARS-CoV-2) nucleic acids may be present in the submitted sample  additional confirmatory testing may be necessary for epidemiological  and / or clinical management purposes  to differentiate between  SARS-CoV-2 and other Sarbecovirus currently known to infect humans.  If clinically indicated  additional testing with an alternate test  methodology [phone]) is advised. The SARS-CoV-2 RNA is generally  detectable in upper and lower respiratory sp ecimens during the acute  phase of infection. The expected result is Negative. Fact Sheet for Patients:  StrictlyIdeas.no Fact Sheet for Healthcare Providers: BankingDealers.co.za This test is not yet approved or cleared by the [place] FDA and  has been authorized for detection and/or diagnosis of SARS-CoV-2 by FDA under an Emergency Use Authorization (EUA).  This EUA will remain in effect (meaning this test can be used) for the duration of the COVID-19 declaration under Section 564(b)(1) of the Act, 21 U.S.C. section 360bbb-3(b)(1), unless the authorization is terminated or revoked sooner. Performed at [facility], [place] [address], [place], [place] [address]     Labs: BNP (last 3 results) Recent Labs    12/24/18 2237 12/28/18 0251  BNP 3,973.6* 675.9*   Basic Metabolic Panel: Recent Labs  Lab 12/26/18 0417 12/27/18 0526 12/28/18 0255 12/29/18 0851 12/30/18 0237  NA 133* 131* 131* 128* 128*  K 3.9 3.6 3.8 4.0 3.8  CL 95* 89* 86* 87* 86*  CO2 27 31 34* 31 31  GLUCOSE 123* 124* 90 110* 135*  BUN 37* 29* 29* 32* 30*  CREATININE 1.26* 1.15 1.20 1.17 1.07  CALCIUM 8.5* 8.6* 8.4* 8.5* 8.7*  MG 1.9 1.5* 1.9 1.9 1.9  PHOS 2.4* 2.3* 3.6 3.4 3.0   Liver Function Tests: Recent Labs  Lab 12/26/18 0417 12/27/18 0526 12/28/18 0255 12/29/18 0851 12/30/18 0237  AST 44* 37 36 33 36  ALT _0 ALKPHOS 91 91 92 107 127*  BILITOT 2.5* 2.2* 1.4* 1.6* 1.8*  PROT 6.5 6.8 6.7 6.8 6.9  ALBUMIN 2.8* 2.7* 2.6* 2.7* 2.8*   No results for input(s): LIPASE, AMYLASE in the last 168 hours. No results for input(s): AMMONIA in the last 168 hours. CBC: Recent Labs  Lab 12/26/18 0417 12/27/18 0526 12/28/18 0255 12/29/18 0225 12/30/18 0237  WBC 21.7*  14.1* 13.2* 10.2 8.3  NEUTROABS  --  12.1* 9.2*  --  5.7  HGB 12.3* 12.6* 11.6* 12.1* 12.6*  HCT 37.6* 39.0 35.7* 37.3* 38.1*  MCV 80.5 81.1 80.0 79.2* 78.7*  PLT 172 153 155 168 207   Cardiac Enzymes: Recent Labs  Lab 12/24/18 2237 12/26/18 0417 12/26/18 0924  TROPONINI 0.08* 0.07* 0.06*   BNP: Invalid input(s): POCBNP CBG: Recent Labs  Lab 12/28/18 0213  GLUCAP 90   D-Dimer No results for input(s): DDIMER in the last 72 hours. Hgb A1c No results for input(s): HGBA1C in the last 72 hours. Lipid Profile No results for input(s): CHOL, HDL, LDLCALC, TRIG, CHOLHDL, LDLDIRECT in the last 72 hours. Thyroid function studies No results for input(s): TSH, T4TOTAL, T3FREE, THYROIDAB in the last 72 hours.  Invalid input(s): FREET3 Anemia work up No results for input(s): VITAMINB12, FOLATE, FERRITIN, TIBC, IRON, RETICCTPCT in the last 72 hours. Urinalysis    Component Value Date/Time   COLORURINE AMBER (A) 11/11/2017 1615   APPEARANCEUR CLEAR 11/11/2017 1615   LABSPEC 1.020 11/11/2017 1615   PHURINE 5.0 11/11/2017 1615   GLUCOSEU NEGATIVE 11/11/2017 1615   HGBUR NEGATIVE 11/11/2017 1615   BILIRUBINUR negative 12/01/2017 0910   KETONESUR negative 12/01/2017 0910   KETONESUR NEGATIVE 11/11/2017 1615   PROTEINUR negative 12/01/2017 0910   PROTEINUR NEGATIVE 11/11/2017 1615   UROBILINOGEN 2.0 (A) 12/01/2017 0910   NITRITE Negative 12/01/2017 0910   NITRITE NEGATIVE 11/11/2017 1615   LEUKOCYTESUR Negative 12/01/2017 0910   Sepsis Labs Invalid input(s): PROCALCITONIN,  WBC,  LACTICIDVEN Microbiology Recent Results (from the past 240 hour(s))  SARS Coronavirus 2 (CEPHEID - Performed in [place] hospital lab), Hosp Order     Status: None   Collection Time: 12/24/18 10:54 PM  Result Value Ref Range Status   SARS Coronavirus 2 NEGATIVE NEGATIVE Final  Comment: (NOTE) If result is NEGATIVE SARS-CoV-2 target nucleic acids are NOT DETECTED. The SARS-CoV-2 RNA is  generally detectable in upper and lower  respiratory specimens during the acute phase of infection. The lowest  concentration of SARS-CoV-2 viral copies this assay can detect is 250  copies / mL. A negative result does not preclude SARS-CoV-2 infection  and should not be used as the sole basis for treatment or other  patient management decisions.  A negative result may occur with  improper specimen collection / handling, submission of specimen other  than nasopharyngeal swab, presence of viral mutation(s) within the  areas targeted by this assay, and inadequate number of viral copies  (<250 copies / mL). A negative result must be combined with clinical  observations, patient history, and epidemiological information. If result is POSITIVE SARS-CoV-2 target nucleic acids are DETECTED. The SARS-CoV-2 RNA is generally detectable in upper and lower  respiratory specimens dur ing the acute phase of infection.  Positive  results are indicative of active infection with SARS-CoV-2.  Clinical  correlation with patient history and other diagnostic information is  necessary to determine patient infection status.  Positive results do  not rule out bacterial infection or co-infection with other viruses. If result is PRESUMPTIVE POSTIVE SARS-CoV-2 nucleic acids MAY BE PRESENT.   A presumptive positive result was obtained on the submitted specimen  and confirmed on repeat testing.  While 2019 novel coronavirus  (SARS-CoV-2) nucleic acids may be present in the submitted sample  additional confirmatory testing may be necessary for epidemiological  and / or clinical management purposes  to differentiate between  SARS-CoV-2 and other Sarbecovirus currently known to infect humans.  If clinically indicated additional testing with an alternate test  methodology [phone]) is advised. The SARS-CoV-2 RNA is generally  detectable in upper and lower respiratory sp ecimens during the acute  phase of  infection. The expected result is Negative. Fact Sheet for Patients:  StrictlyIdeas.no Fact Sheet for Healthcare Providers: BankingDealers.co.za This test is not yet approved or cleared by the [place] FDA and has been authorized for detection and/or diagnosis of SARS-CoV-2 by FDA under an Emergency Use Authorization (EUA).  This EUA will remain in effect (meaning this test can be used) for the duration of the COVID-19 declaration under Section 564(b)(1) of the Act, 21 U.S.C. section 360bbb-3(b)(1), unless the authorization is terminated or revoked sooner. Performed at [facility], [place] [address], [place], [place] [address]    Time coordinating discharge: 35 minutes  SIGNED:  Kerney Elbe, DO [facility] 12/30/2018, 2:19 PM Pager is on AMION  If 7PM-7AM, please contact night-coverage [email or web address] Password [email or web address]

## 2018-12-30 NOTE — Progress Notes (Signed)
Progress Note  Patient Name: Mark [name] Date of Encounter: 12/30/2018  Primary Cardiologist: Verne Carrow, MD   Subjective   He feels back to baseline, wants to go home, refuses to take coumadin.  Inpatient Medications    Scheduled Meds: . aspirin EC  81 mg Oral Daily  . atorvastatin  40 mg Oral q1800  . Chlorhexidine Gluconate Cloth  6 each Topical Daily  . clindamycin  450 mg Oral Q8H  . digoxin  0.125 mg Oral Daily  . enoxaparin (LOVENOX) injection  1 mg/kg Subcutaneous Q12H  . furosemide  40 mg Intravenous BID  . furosemide  40 mg Oral Daily  . guaiFENesin  1,200 mg Oral BID  . nicotine  14 mg Transdermal Daily  . spironolactone  12.5 mg Oral Daily  . warfarin  5 mg Oral ONCE-1800  . Warfarin - Pharmacist Dosing Inpatient   Does not apply q1800   Continuous Infusions:  PRN Meds: acetaminophen **OR** acetaminophen, albuterol, diphenhydrAMINE   Vital Signs    Vitals:   12/30/18 0500 12/30/18 0600 12/30/18 0700 12/30/18 0813  BP: 108/88 125/86 107/81   Pulse:      Resp: 20 (!) 21 (!) 25   Temp:    (!) 97.5 F (36.4 C)  TempSrc:    Axillary  SpO2: 97% 97% 95%   Weight:      Height:        Intake/Output Summary (Last 24 hours) at 12/30/2018 0955 Last data filed at 12/30/2018 0000 Gross per 24 hour  Intake -  Output 2000 ml  Net -2000 ml   Last 3 Weights 12/28/2018 12/27/2018 12/26/2018  Weight (lbs) 158 lb 11.7 oz 152 lb 8.9 oz 164 lb 3.9 oz  Weight (kg) 72 kg 69.2 kg 74.5 kg     Telemetry    SR, runs of nsVT up to 9 beats - Personally Reviewed  ECG    No new - Personally Reviewed  Physical Exam   GEN: No acute distress.   Neck: +2 cm JVD Cardiac: RRR, 4/6 systolic murmurs, + S4, or gallops.  Respiratory: clear to auscultation bilaterally. GI: Soft, nontender, non-distended  MS: + 1 edema B/L up to knees; No deformity. Neuro:  Nonfocal  Psych: Normal affect   Labs    Chemistry Recent Labs  Lab 12/28/18 0255 12/29/18 0851  12/30/18 0237  NA 131* 128* 128*  K 3.8 4.0 3.8  CL 86* 87* 86*  CO2 34* 31 31  GLUCOSE 90 110* 135*  BUN 29* 32* 30*  CREATININE 1.20 1.17 1.07  CALCIUM 8.4* 8.5* 8.7*  PROT 6.7 6.8 6.9  ALBUMIN 2.6* 2.7* 2.8*  AST 36 33 36  ALT 23 22 25   ALKPHOS 92 107 127*  BILITOT 1.4* 1.6* 1.8*  GFRNONAA >60 >60 >60  GFRAA >60 >60 >60  ANIONGAP 11 10 11     Hematology Recent Labs  Lab 12/28/18 0255 12/29/18 0225 12/30/18 0237  WBC 13.2* 10.2 8.3  RBC 4.46 4.71 4.84  HGB 11.6* 12.1* 12.6*  HCT 35.7* 37.3* 38.1*  MCV 80.0 79.2* 78.7*  MCH 26.0 25.7* 26.0  MCHC 32.5 32.4 33.1  RDW 18.1* 17.5* 17.3*  PLT 155 168 207   Cardiac Enzymes Recent Labs  Lab 12/24/18 2237 12/26/18 0417 12/26/18 0924  TROPONINI 0.08* 0.07* 0.06*   No results for input(s): TROPIPOC in the last 168 hours.   BNP Recent Labs  Lab 12/24/18 2237 12/28/18 0251  BNP 3,973.6* 848.5*    DDimer No  results for input(s): DDIMER in the last 168 hours.   Radiology    No results found. Cardiac Studies   TTE; 12/25/18 Moderate LV dilatation and severe systolic dysfunction with LVEF 5-10% and diffuse hypokinesis. 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 Grade 2 diastolic dysfunction with severely elevated filling pressures.   Right ventricle is severely dilated and now with severe systolic dysfunction (previously mild to moderate). RVSP is 42 mmHg consistent with moderate pulmonary hypertension, however underestimated in teh settings of severe RV dysfunction. RVSP previously 56 mmHg.   Moderate to severe mitral and severe tricuspid regurgitation.   Patient Profile     43 y.o. male   Assessment & Plan    1. Acute on Chronic systolic CHF, Echo 09/2017 LVEF 5-10%, severe RV systolic dysfunction - NICM by cath 2017.Suspect etiology is polysubstance abuse - NYHAIV, severe biventricular failure, given long term non-compliance, ongoing cocaine use the patient is not a candidate for  any advanced therapies such as inotrope infusion or  LVAD/transplant down the road. - palliative care was consulted, he is in denial about his condition and is not ready - his BP has improved, he was able to receive diuretics and is significantly improved, weight down to 65 kg = 142 lbs, bellow his baseline of 147 lbs. - he can be discharged today - switch lasix to 80 mg PO daily starting tomorrow and spironolactone 12.5 mg po daily starting today  - BNP 3973 - Crea 1.28->1.26->1.15->1.07 - baseline weight 147 lbs, on admission 161, now down to 152 lbs - he has not been taking any meds at home  2. LV thrombus - h/o CVA - we started coumadin given mobile LV thrombus and h/o CVA. He refuses to take it.  3. sVT - 11 beats today -unable to add BB sec to hypotension - not candidate for ICD given non-compliance and polysubstance abuse  4. Polysubstance abuse - UDS positive for cocaine  4. 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 There is associated slow/absent flow within the associated right M2 branch. - No lasting deficit.  - He is on ASA and stain alone with poor prognosis. He has not been taking statin. - Likely cardio-embolic. Ideally would be on warfarin but not compliant enough at this point. Reconsider in future if compliance improves.  [facility] will sign off.   Medication Recommendations:  As above Other recommendations (labs, testing, etc):  No further testing Follow up as an outpatient:  We will arrange  For questions or updates, please contact [facility] Please consult [email or web address] for contact info under     Signed, Tobias Alexander, MD  12/30/2018, 9:55 AM

## 2018-12-30 NOTE — Progress Notes (Signed)
ANTICOAGULATION CONSULT NOTE - Initial Consult  Pharmacy Consult for warfarin  Indication: LV thrombus  Allergies  Allergen Reactions  . Hydrocodone Hives  . Lisinopril Swelling and Other (See Comments)    Facial swelling/angioedema  . Prednisone Shortness Of Breath, Nausea Only, Swelling and Other (See Comments)    Also made chest feel tight and genital area, legs, and face became swollen badly  . Penicillins Hives and Swelling     Has patient had a PCN reaction causing immediate rash, facial/tongue/throat swelling, SOB or lightheadedness with hypotension: Yes Has patient had a PCN reaction causing severe rash involving mucus membranes or skin necrosis: No Has patient had a PCN reaction that required hospitalization: No Has patient had a PCN reaction occurring within the last 10 years: No If all of the above answers are "NO", then may proceed with Cephalosporin use.     Patient Measurements: Height: 5\' 11"  (180.3 cm) Weight: 149 lb 11.1 oz (67.9 kg) IBW/kg (Calculated) : 75.3 Heparin Dosing Weight = TBW = 72 kg  Vital Signs: Temp: 97.5 F (36.4 C) (05/10 0813) Temp Source: Axillary (05/10 0813) BP: 107/81 (05/10 0700)  Labs: Recent Labs    12/28/18 0255 12/28/18 0512 12/29/18 0225 12/29/18 0851 12/30/18 0237  HGB 11.6*  --  12.1*  --  12.6*  HCT 35.7*  --  37.3*  --  38.1*  PLT 155  --  168  --  207  LABPROT 26.5*  --  18.7*  --  15.5*  INR 2.5*  --  1.6*  --  1.2  HEPARINUNFRC  --  0.43  --   --   --   CREATININE 1.20  --   --  1.17 1.07    Estimated Creatinine Clearance: 84.6 mL/min (by C-G formula based on SCr of 1.07 mg/dL).   Medical History: Past Medical History:  Diagnosis Date  . Asthma   . Chronic systolic CHF (congestive heart failure) (HCC)   . Cigarette smoker   . CKD (chronic kidney disease), stage II    [name] 10/01/2017  . History of echocardiogram    a. Echo 5/17 - EF 20-25%, severe diff HK, restrictive physiology, mild to mod MR, severe  reduced RVSF, mod RVE, mild RAE, mod TR, PASP 48 mmHg  . Hx of cardiac cath    a. LHC 5/17 - normal coronary arteries. PA 45/25, mean 33, PCWP mean 18  . NICM (nonischemic cardiomyopathy) (HCC)   . Stroke (HCC) 09/27/2017   "was weak on my left side; I'm fully recovered" (11/09/2017)  . Substance abuse (HCC)    cocaine, marijuana   Assessment: Patient is a 43 year old male admitted with SOB. PMH significant for HF, CVA in 2019, asthma, and substance abuse. Pt has history of medication non-compliance. Cardiology following. Diagnosed with LV thrombus and being started on warfarin with heparin bridge. Pt was not on anticoagulant prior to admission.  Baseline labs: -Hgb 13.4 -Plt 174 -INR: 1.6, elevated at baseline -aPTT: 29  Significant Events: -Patient refused warfarin dose on 5/9  Today, 12/30/18  Hgb 12.6 - remains low but stable  Plt 207 - WNL  INR 1.2 subtherapeutic and decreased. Patient refused warfarin dose on 5/9.  No significant DDI with warfarin. Of note, patient is on ASA 81 mg PO daily.  Per discussion with MD, will initiate LMWH bridge therapy for subtherapeutic INR.  Goal of Therapy:  INR 2-3 Monitor platelets by anticoagulation protocol: Yes   Plan:   Warfarin 5 mg PO  once today.  Enoxaparin 70 mg (1 mg/kg) subQ q12h  INR daily while inpatient  CBC with AM labs tomorrow  Monitor for signs/symptoms of bleeding or thrombosis  Patient provided warfarin education booklet. Discharge counseling provided including signs/symptoms of bleeding and thrombosis as well as the importance of monitoring/compliance.  If patient discharged today, recommend the following warfarin dose on discharge: -Warfarin 5 mg PO once daily on 5/10 and 5/11 -Enoxaparin 1 mg/kg (70 mg) subQ q12h bridge therapy for subtherapeutic INR -Outpatient appointment for INR check on Tuesday 5/12 to guide additional dosing recommendations and monitoring.  Cindi Carbon, PharmD 12/30/2018,11:08  AM

## 2018-12-30 NOTE — Discharge Instructions (Signed)

## 2018-12-30 NOTE — Progress Notes (Signed)
Daily Progress Note   Patient Name: Mark [name]       Date: 12/30/2018 DOB: 23-Nov-1975  Age: 43 y.o. MRN#: [id number] Attending Physician: Kerney Elbe, DO Primary Care Physician: Dorena Dew, FNP Admit Date: 12/24/2018  Reason for Consultation/Follow-up: Establishing goals of care  Subjective: I met with Mark [name] today.  He reports that he is feeling better and wants to go home soon.  Attempted to reengage in goals conversation including education and concerns that he has a chronic, progressive disease that has reached end-stage.  He declined to discuss further with me and continues to minimize the severity of his illness.  Length of Stay: 5  Current Medications: Scheduled Meds:  . aspirin EC  81 mg Oral Daily  . atorvastatin  40 mg Oral q1800  . budesonide (PULMICORT) nebulizer solution  0.25 mg Nebulization BID  . Chlorhexidine Gluconate Cloth  6 each Topical Daily  . clindamycin  450 mg Oral Q8H  . digoxin  0.125 mg Oral Daily  . furosemide  40 mg Intravenous BID  . furosemide  40 mg Oral Daily  . guaiFENesin  1,200 mg Oral BID  . nicotine  14 mg Transdermal Daily  . spironolactone  12.5 mg Oral Daily  . warfarin  5 mg Oral ONCE-1800  . Warfarin - Pharmacist Dosing Inpatient   Does not apply q1800    Continuous Infusions:   PRN Meds: acetaminophen **OR** acetaminophen, albuterol, diphenhydrAMINE  Physical Exam         General: Alert, awake, in no acute distress.  HEENT: No bruits, no goiter, no JVD Abdomen: Soft, nondistended Ext: + edema Skin: Warm and dry Neuro: Grossly intact, nonfocal.  Vital Signs: BP 107/81   Pulse (!) 102   Temp 98.2 F (36.8 C) (Oral)   Resp (!) 25   Ht '5\' 11"'  (1.803 m)   Wt 72 kg   SpO2 95%   BMI 22.14 kg/m  SpO2:  SpO2: 95 % O2 Device: O2 Device: Room Air O2 Flow Rate: O2 Flow Rate (L/min): 4 L/min  Intake/output summary:   Intake/Output Summary (Last 24 hours) at 12/30/2018 0757 Last data filed at 12/30/2018 0000 Gross per 24 hour  Intake -  Output 2000 ml  Net -2000 ml   LBM: Last BM Date: 12/28/18 Baseline Weight: Weight:  72.2 kg Most recent weight: Weight: 72 kg       Palliative Assessment/Data:      Patient Active Problem List   Diagnosis Date Noted  . SOB (shortness of breath)   . Cocaine use   . Acute on chronic systolic congestive heart failure ([place]) 12/25/2018  . Demand ischemia ([place])   . LV (left ventricular) mural thrombus without MI   . Noncompliance   . Chronic anemia   . Acute systolic CHF (congestive heart failure) ([place]) 11/09/2017  . History of right MCA stroke 09/28/2017  . Stroke ([place]) 09/28/2017  . Chronic systolic heart failure ([place]) 01/11/2016  . NICM (nonischemic cardiomyopathy) ([place]) 01/11/2016  . CKD (chronic kidney disease) stage 2, GFR 60-89 ml/min 01/11/2016  . Cocaine abuse ([place]) 01/11/2016  . Chest pain, pleuritic 01/03/2016  . Tobacco abuse 01/03/2016  . Asthma 01/03/2016  . Leg swelling 01/03/2016  . Tachycardia 01/03/2016  . Normocytic anemia 01/03/2016  . Elevated troponin I level 01/03/2016  . Right rib fracture 01/03/2016  . Chest pain     Palliative Care Assessment & Plan   Patient Profile: 43 yo male with severe biventricular heart failure  Assessment: Patient Active Problem List   Diagnosis Date Noted  . SOB (shortness of breath)   . Cocaine use   . Acute on chronic systolic congestive heart failure ([place]) 12/25/2018  . Demand ischemia ([place])   . LV (left ventricular) mural thrombus without MI   . Noncompliance   . Chronic anemia   . Acute systolic CHF (congestive heart failure) ([place]) 11/09/2017  . History of right MCA stroke 09/28/2017  . Stroke ([place]) 09/28/2017  . Chronic systolic heart failure ([place]) 01/11/2016  . NICM  (nonischemic cardiomyopathy) ([place]) 01/11/2016  . CKD (chronic kidney disease) stage 2, GFR 60-89 ml/min 01/11/2016  . Cocaine abuse ([place]) 01/11/2016  . Chest pain, pleuritic 01/03/2016  . Tobacco abuse 01/03/2016  . Asthma 01/03/2016  . Leg swelling 01/03/2016  . Tachycardia 01/03/2016  . Normocytic anemia 01/03/2016  . Elevated troponin I level 01/03/2016  . Right rib fracture 01/03/2016  . Chest pain      Recommendations/Plan: - Mark [name] continues to minimize how ill he is.  He reports feeling better and wanting to go home.  Declined to engage in further conversation with me today. - PMT to continue to follow intermittently, but I doubt he is going to engage further this hospitalization  Goals of Care and Additional Recommendations:  Limitations on Scope of Treatment: Full Scope Treatment  Code Status:    Code Status Orders  (From admission, onward)         Start     Ordered   12/25/18 0026  Full code  Continuous     12/25/18 0028        Code Status History    Date Active Date Inactive Code Status Order ID Comments User Context   11/09/2017 2159 11/14/2017 2051 Full Code [id number]  Rise Patience, MD Inpatient   09/28/2017 0747 10/01/2017 2020 Full Code [id number]  Radene Gunning, NP ED   09/20/2017 2322 09/22/2017 1655 Full Code [id number]  Vianne Bulls, MD ED   02/07/2016 1044 02/08/2016 1802 Full Code [id number]  Norman Herrlich, MD ED   01/03/2016 0520 01/06/2016 1517 Full Code [id number]  Opyd, Ilene Qua, MD ED       Prognosis:   Guarded  Discharge Planning:  Home when medically ready  Care plan was discussed with patient, Dr.  Alfredia Ferguson  Thank you for allowing the Palliative Medicine Team to assist in the care of this patient.   Total Time 20 Prolonged Time Billed No      Greater than 50%  of this time was spent counseling and coordinating care related to the above assessment and plan.  Micheline Rough, MD  Please contact Palliative Medicine Team phone  at [phone] for questions and concerns.

## 2018-12-31 MED FILL — !PROVENTIL HFA 90 MCG INH: 108 (90 BAS | 16 days supply | Qty: 1 | Fill #0

## 2018-12-31 MED FILL — DIGOXIN 0.125 MG TABLET: 125 | 30 days supply | Qty: 30 | Fill #0

## 2018-12-31 MED FILL — FUROSEMIDE 80 MG TAB: 80 | 30 days supply | Qty: 30 | Fill #0

## 2018-12-31 MED FILL — SPIRONOLACTONE 25 MG TABLET: 25 | 30 days supply | Qty: 15 | Fill #0

## 2018-12-31 MED FILL — CLINDAMYCIN HCL 150 MG CAPS: 150 | 3 days supply | Qty: 27 | Fill #0

## 2018-12-31 MED FILL — NICOTINE 14 MG/24HR PATCH: 14 | 28 days supply | Qty: 28 | Fill #0

## 2018-12-31 MED FILL — LOSARTAN POTASSIUM 25 MG TA: 25 | 30 days supply | Qty: 30 | Fill #0

## 2018-12-31 MED FILL — ATORVASTATIN CALCIUM 40 MG: 40 | 30 days supply | Qty: 30 | Fill #0

## 2019-01-07 ENCOUNTER — Ambulatory Visit (HOSPITAL_COMMUNITY)
Admission: RE | Admit: 2019-01-07 | Discharge: 2019-01-07 | Disposition: A | Discharge status: Home / Self Care | Payer: Self-pay | Source: Ambulatory Visit | Attending: Internal Medicine | Admitting: Internal Medicine

## 2019-01-07 ENCOUNTER — Other Ambulatory Visit: Payer: Self-pay

## 2019-01-24 ENCOUNTER — Encounter (HOSPITAL_COMMUNITY): Payer: Self-pay | Admitting: Emergency Medicine

## 2019-01-24 ENCOUNTER — Emergency Department (HOSPITAL_COMMUNITY): Payer: Medicaid Other

## 2019-01-24 ENCOUNTER — Inpatient Hospital Stay (HOSPITAL_COMMUNITY)
Admission: EM | Admit: 2019-01-24 | Discharge: 2019-01-25 | DRG: 292 | Disposition: A | Discharge status: Home / Self Care | Payer: Medicaid Other | Attending: Internal Medicine | Admitting: Internal Medicine

## 2019-01-24 ENCOUNTER — Other Ambulatory Visit: Payer: Self-pay

## 2019-01-24 DIAGNOSIS — Z8673 Personal history of transient ischemic attack (TIA), and cerebral infarction without residual deficits: Secondary | ICD-10-CM

## 2019-01-24 DIAGNOSIS — F17210 Nicotine dependence, cigarettes, uncomplicated: Secondary | ICD-10-CM | POA: Diagnosis present

## 2019-01-24 DIAGNOSIS — Z720 Tobacco use: Secondary | ICD-10-CM | POA: Diagnosis present

## 2019-01-24 DIAGNOSIS — I5023 Acute on chronic systolic (congestive) heart failure: Secondary | ICD-10-CM | POA: Diagnosis not present

## 2019-01-24 DIAGNOSIS — Z7982 Long term (current) use of aspirin: Secondary | ICD-10-CM

## 2019-01-24 DIAGNOSIS — Z8249 Family history of ischemic heart disease and other diseases of the circulatory system: Secondary | ICD-10-CM | POA: Diagnosis not present

## 2019-01-24 DIAGNOSIS — I428 Other cardiomyopathies: Secondary | ICD-10-CM | POA: Diagnosis present

## 2019-01-24 DIAGNOSIS — R0602 Shortness of breath: Secondary | ICD-10-CM

## 2019-01-24 DIAGNOSIS — Z20828 Contact with and (suspected) exposure to other viral communicable diseases: Secondary | ICD-10-CM | POA: Diagnosis present

## 2019-01-24 DIAGNOSIS — N182 Chronic kidney disease, stage 2 (mild): Secondary | ICD-10-CM | POA: Diagnosis present

## 2019-01-24 DIAGNOSIS — I5042 Chronic combined systolic (congestive) and diastolic (congestive) heart failure: Secondary | ICD-10-CM | POA: Diagnosis present

## 2019-01-24 DIAGNOSIS — I509 Heart failure, unspecified: Secondary | ICD-10-CM | POA: Insufficient documentation

## 2019-01-24 DIAGNOSIS — Z9111 Patient's noncompliance with dietary regimen: Secondary | ICD-10-CM

## 2019-01-24 DIAGNOSIS — Z9114 Patient's other noncompliance with medication regimen: Secondary | ICD-10-CM | POA: Diagnosis not present

## 2019-01-24 DIAGNOSIS — J45909 Unspecified asthma, uncomplicated: Secondary | ICD-10-CM | POA: Diagnosis present

## 2019-01-24 DIAGNOSIS — F1410 Cocaine abuse, uncomplicated: Secondary | ICD-10-CM | POA: Diagnosis present

## 2019-01-24 DIAGNOSIS — Z79899 Other long term (current) drug therapy: Secondary | ICD-10-CM | POA: Diagnosis not present

## 2019-01-24 LAB — CBC WITH DIFFERENTIAL/PLATELET
Abs Immature Granulocytes: 0.02 10*3/uL (ref 0.00–0.07)
Basophils Absolute: 0 10*3/uL (ref 0.0–0.1)
Basophils Relative: 1 %
Eosinophils Absolute: 0 10*3/uL (ref 0.0–0.5)
Eosinophils Relative: 1 %
HCT: 45.5 % (ref 39.0–52.0)
Hemoglobin: 14.2 g/dL (ref 13.0–17.0)
Immature Granulocytes: 1 %
Lymphocytes Relative: 37 %
Lymphs Abs: 1.6 10*3/uL (ref 0.7–4.0)
MCH: 25.8 pg — ABNORMAL LOW (ref 26.0–34.0)
MCHC: 31.2 g/dL (ref 30.0–36.0)
MCV: 82.7 fL (ref 80.0–100.0)
Monocytes Absolute: 0.4 10*3/uL (ref 0.1–1.0)
Monocytes Relative: 10 %
Neutro Abs: 2.3 10*3/uL (ref 1.7–7.7)
Neutrophils Relative %: 50 %
Platelets: 192 10*3/uL (ref 150–400)
RBC: 5.5 MIL/uL (ref 4.22–5.81)
RDW: 19.9 % — ABNORMAL HIGH (ref 11.5–15.5)
WBC: 4.4 10*3/uL (ref 4.0–10.5)
nRBC: 0.9 % — ABNORMAL HIGH (ref 0.0–0.2)

## 2019-01-24 LAB — COMPREHENSIVE METABOLIC PANEL
ALT: 47 U/L — ABNORMAL HIGH (ref 0–44)
AST: 71 U/L — ABNORMAL HIGH (ref 15–41)
Albumin: 3.2 g/dL — ABNORMAL LOW (ref 3.5–5.0)
Alkaline Phosphatase: 122 U/L (ref 38–126)
Anion gap: 10 (ref 5–15)
BUN: 20 mg/dL (ref 6–20)
CO2: 20 mmol/L — ABNORMAL LOW (ref 22–32)
Calcium: 9.1 mg/dL (ref 8.9–10.3)
Chloride: 104 mmol/L (ref 98–111)
Creatinine, Ser: 1.18 mg/dL (ref 0.61–1.24)
GFR calc Af Amer: >60 mL/min (ref >60)
GFR calc non Af Amer: >60 mL/min (ref >60)
Glucose, Bld: 110 mg/dL — ABNORMAL HIGH (ref 70–99)
Potassium: 4.7 mmol/L (ref 3.5–5.1)
Sodium: 134 mmol/L — ABNORMAL LOW (ref 135–145)
Total Bilirubin: 3.3 mg/dL — ABNORMAL HIGH (ref 0.3–1.2)
Total Protein: 7.5 g/dL (ref 6.5–8.1)

## 2019-01-24 LAB — I-STAT TROPONIN, ED: Troponin i, poc: 0.06 ng/mL (ref 0.00–0.08)

## 2019-01-24 LAB — URINALYSIS, ROUTINE W REFLEX MICROSCOPIC
Bilirubin Urine: NEGATIVE
Glucose, UA: NEGATIVE mg/dL
Hgb urine dipstick: NEGATIVE
Ketones, ur: NEGATIVE mg/dL
Leukocytes,Ua: NEGATIVE
Nitrite: NEGATIVE
Protein, ur: NEGATIVE mg/dL
Specific Gravity, Urine: 1.005 (ref 1.005–1.030)
pH: 5 (ref 5.0–8.0)

## 2019-01-24 LAB — BRAIN NATRIURETIC PEPTIDE: B Natriuretic Peptide: 2792.5 pg/mL — ABNORMAL HIGH (ref 0.0–100.0)

## 2019-01-24 MED ORDER — FUROSEMIDE 10 MG/ML IJ SOLN
40.0000 mg | Freq: Once | INTRAMUSCULAR | Status: AC
  Administered 2019-01-24: 40 mg via INTRAVENOUS
  Filled 2019-01-24: qty 4

## 2019-01-24 NOTE — ED Notes (Signed)
This RN called house coverage to notify them that this pt is adamantly refusing the test. House coverage will arrange bed placement for this pt.

## 2019-01-24 NOTE — ED Notes (Signed)
This RN revisited covid testing with this pt and he adamantly refused again. Per my charge RN who spoke with house coverage,  I explained that without a test he will be treated as possible positive, placed on precautions and may be placed on a covid positive unit.

## 2019-01-24 NOTE — ED Notes (Signed)
Asked pt if he had reconsidered allowing me to administer the covid test. He declined.

## 2019-01-24 NOTE — ED Provider Notes (Signed)
[name] EMERGENCY DEPARTMENT Provider Note   CSN: [id number] Arrival date & time: 01/24/19  1911    History   Chief Complaint Chief Complaint  Patient presents with  . Shortness of Breath    HPI Mark [name] is a 43 y.o. male.     43 year old male with a past medical history of CKD, chronic systolic CHF, cigarette smoker, asthma presents to the ED with complaints of shortness of breath, swelling to his face along with legs x1 week.  Patient reports he was recently out of his Lasix medications for about 2 weeks, reports he received these about a week ago and has been taking them daily as prescribed but feels that he is short of breath especially with exertion.  Reports he also noted his face swelling specifically the right side along with his lips and tongue, there was also swelling to both of his legs.  He also endorses some scrotal swelling when all of this is happening.  He does report the Lasix has helped with the swelling.  Patient reports he was treated for a tooth infection while in the hospital, was given an antibiotic unknown which 1 which caused him to get hives, along with facial swelling, he has since stopped taking this antibiotic.  He denies any cough, fever, chest pain or known exposure to COVID-19 patient.     Past Medical History:  Diagnosis Date  . Asthma   . Chronic systolic CHF (congestive heart failure) (HCC)   . Cigarette smoker   . CKD (chronic kidney disease), stage II    [name] 10/01/2017  . History of echocardiogram    a. Echo 5/17 - EF 20-25%, severe diff HK, restrictive physiology, mild to mod MR, severe reduced RVSF, mod RVE, mild RAE, mod TR, PASP 48 mmHg  . Hx of cardiac cath    a. LHC 5/17 - normal coronary arteries. PA 45/25, mean 33, PCWP mean 18  . NICM (nonischemic cardiomyopathy) (HCC)   . Stroke (HCC) 09/27/2017   "was weak on my left side; I'm fully recovered" (11/09/2017)  . Substance abuse (HCC)    cocaine,  marijuana    Patient Active Problem List   Diagnosis Date Noted  . SOB (shortness of breath)   . Cocaine use   . Acute on chronic systolic congestive heart failure (HCC) 12/25/2018  . Demand ischemia (HCC)   . LV (left ventricular) mural thrombus without MI   . Noncompliance   . Chronic anemia   . Acute systolic CHF (congestive heart failure) (HCC) 11/09/2017  . History of right MCA stroke 09/28/2017  . Stroke (HCC) 09/28/2017  . Chronic systolic heart failure (HCC) 01/11/2016  . NICM (nonischemic cardiomyopathy) (HCC) 01/11/2016  . CKD (chronic kidney disease) stage 2, GFR 60-89 ml/min 01/11/2016  . Cocaine abuse (HCC) 01/11/2016  . Chest pain, pleuritic 01/03/2016  . Tobacco abuse 01/03/2016  . Asthma 01/03/2016  . Leg swelling 01/03/2016  . Tachycardia 01/03/2016  . Normocytic anemia 01/03/2016  . Elevated troponin I level 01/03/2016  . Right rib fracture 01/03/2016  . Chest pain     Past Surgical History:  Procedure Laterality Date  . CARDIAC CATHETERIZATION N/A 01/05/2016   Procedure: Right/Left Heart Cath and Coronary Angiography;  Surgeon: Lennette Bihari, MD;  Location: [facility] INVASIVE CV LAB;  Service: Cardiovascular;  Laterality: N/A;        Home Medications    Prior to Admission medications   Medication Sig Start Date End Date Taking? Authorizing  Provider  albuterol (VENTOLIN HFA) 108 (90 Base) MCG/ACT inhaler Inhale 1-2 puffs into the lungs every 4 (four) hours as needed for wheezing or shortness of breath. 12/30/18   Marguerita Merles Latif, DO  aspirin 81 MG EC tablet Take 1 tablet (81 mg total) by mouth daily. 12/30/18   Marguerita Merles Latif, DO  atorvastatin (LIPITOR) 40 MG tablet Take 1 tablet (40 mg total) by mouth daily at 6 PM. 12/30/18   Marguerita Merles Latif, DO  budesonide-formoterol [facility]) 80-4.5 MCG/ACT inhaler Inhale 2 puffs into the lungs 2 (two) times daily. 10/12/17   Massie Maroon, FNP  digoxin (LANOXIN) 0.125 MG tablet Take 1 tablet (0.125 mg  total) by mouth daily. 12/30/18   Marguerita Merles Latif, DO  furosemide (LASIX) 80 MG tablet Take 1 tablet (80 mg total) by mouth daily. 12/31/18   Marguerita Merles Latif, DO  losartan (COZAAR) 25 MG tablet Take 1 tablet (25 mg total) by mouth at bedtime. DISCUSS WITH CARDIOLOGY ABOUT WHEN TO START 12/30/18 03/30/19  Marguerita Merles Latif, DO  Multiple Vitamin (MULTIVITAMIN WITH MINERALS) TABS tablet Take 1 tablet by mouth daily.    [provider]  nicotine (NICODERM CQ - DOSED IN MG/24 HOURS) 14 mg/24hr patch Place 1 patch (14 mg total) onto the skin daily. 12/31/18   Marguerita Merles Latif, DO  spironolactone (ALDACTONE) 25 MG tablet Take 0.5 tablets (12.5 mg total) by mouth daily. 12/31/18   Merlene Laughter, DO    Family History Family History  Problem Relation Age of Onset  . Cardiomyopathy Father        Reports his father has an LVAD  . Heart failure Father   . Hypertension Father   . Deep vein thrombosis Neg Hx     Social History Social History   Tobacco Use  . Smoking status: Former Smoker    Packs/day: 1.00    Years: 30.00    Pack years: 30.00    Types: Cigarettes    Last attempt to quit: 09/27/2017    Years since quitting: 1.3  . Smokeless tobacco: Never Used  Substance Use Topics  . Alcohol use: Yes    Alcohol/week: 5.0 standard drinks    Types: 5 Shots of liquor per week    Frequency: Never    Comment: 5-6 shots of vodka daily; "stopped it all after I had stroke 09/27/2017"  . Drug use: Yes    Types: Cocaine, Marijuana    Comment: 11/09/2017 "none since 09/27/2017"     Allergies   Hydrocodone; Lisinopril; Prednisone; and Penicillins   Review of Systems Review of Systems  Constitutional: Negative for chills and fever.  HENT: Positive for facial swelling. Negative for ear pain and sore throat.   Eyes: Negative for pain and visual disturbance.  Respiratory: Positive for shortness of breath. Negative for cough.   Cardiovascular: Negative for chest pain and  palpitations.  Gastrointestinal: Negative for abdominal pain and vomiting.  Genitourinary: Negative for dysuria and hematuria.  Musculoskeletal: Negative for arthralgias and back pain.  Skin: Negative for color change and rash.  Neurological: Negative for seizures and syncope.  All other systems reviewed and are negative.    Physical Exam Updated Vital Signs BP 109/82   Pulse (!) 55   Temp 97.6 F (36.4 C) (Oral)   Resp (!) 23   SpO2 (!) 89%   Physical Exam   ED Treatments / Results  Labs (all labs ordered are listed, but only abnormal results are displayed) Labs Reviewed  CBC WITH DIFFERENTIAL/PLATELET - Abnormal; Notable for the following components:      Result Value   MCH 25.8 (*)    RDW 19.9 (*)    nRBC 0.9 (*)    All other components within normal limits  COMPREHENSIVE METABOLIC PANEL - Abnormal; Notable for the following components:   Sodium 134 (*)    CO2 20 (*)    Glucose, Bld 110 (*)    Albumin 3.2 (*)    AST 71 (*)    ALT 47 (*)    Total Bilirubin 3.3 (*)    All other components within normal limits  BRAIN NATRIURETIC PEPTIDE - Abnormal; Notable for the following components:   B Natriuretic Peptide 2,792.5 (*)    All other components within normal limits  URINALYSIS, ROUTINE W REFLEX MICROSCOPIC - Abnormal; Notable for the following components:   Color, Urine STRAW (*)    All other components within normal limits  SARS CORONAVIRUS 2 (HOSPITAL ORDER, PERFORMED IN [facility] LAB)  I-STAT TROPONIN, ED    EKG EKG Interpretation  Date/Time:  Thursday January 24 2019 19:16:15 EDT Ventricular Rate:  105 PR Interval:    QRS Duration: 107 QT Interval:  336 QTC Calculation: 444 R Axis:   -134 Text Interpretation:  Sinus tachycardia Abnormal lateral Q waves Anterior infarct, old No significant change since 5/20 Confirmed by Meridee Score [phone]) on 01/24/2019 7:32:40 PM   Radiology Dg Chest Port 1 View  Result Date: 01/24/2019 CLINICAL DATA:   Shortness of breath. EXAM: PORTABLE CHEST 1 VIEW COMPARISON:  Chest x-ray dated November 09, 2017. FINDINGS: Stable mild cardiomegaly. Normal mediastinal contours. Normal pulmonary vascularity. The lungs remain hyperinflated with emphysematous changes. No focal consolidation, pleural effusion, or pneumothorax. No acute osseous abnormality. IMPRESSION: 1. No active disease. 2. COPD. Electronically Signed   By: Obie Dredge M.D.   On: 01/24/2019 20:32    Procedures Procedures (including critical care time)  Medications Ordered in ED Medications  furosemide (LASIX) injection 40 mg (has no administration in time range)     Initial Impression / Assessment and Plan / ED Course  I have reviewed the triage vital signs and the nursing notes.  Pertinent labs & imaging results that were available during my care of the patient were reviewed by me and considered in my medical decision making (see chart for details).  Clinical Course as of Jan 24 2152  Thu Jan 24, 2019  2129 Elevated from his previous visit.  B Natriuretic Peptide(!): 2,792.5 [JS]    Clinical Course User Index [JS] Claude Manges, PA-C   Patient with a previous history of chronic systolic heart failure, last TTE performed on zero 09/28/2017 show an EF of 10-15%, previous cardiac cath in 2017 along with substance abuse presents to the ED with worsening shortness of breath.  Patient reports he was seen at [facility] about a week ago, I cannot locate these records at this time, reports he had been out of his Lasix for 2 week, recently began taking them again about a week ago and has been taking them since. Patient arrives explaining that his shortness of breath is worse with exertion, unable to walk as he reports swelling to his lower extremities.  CBC showed no leukocytosis, hemoglobin is within normal limits.  CMP showed a slight decrease in sodium.  LFTs are slightly elevated, he denies any substance abuse or alcohol intake.   Troponin was also negative.  BNP was 2,792.5, he is currently satting at  96% on room air, in no acute respiratory distress however I suspect patient could benefit from IV diuresis overnight.  Chest x-ray showed the OPD, he reports he stopped smoking about a week ago.  Will now obtain COVID testing prior to admission for further diuresis.  9:52 PM Spoek to Dr. Mikeal Hawthorne who will evaluate and admit patient for acute exacerbation of chronic systolic heart failure.  Portions of this note were generated with Scientist, clinical (histocompatibility and immunogenetics). Dictation errors may occur despite best attempts at proofreading.       Final Clinical Impressions(s) / ED Diagnoses   Final diagnoses:  Acute on chronic systolic congestive heart failure [facility])    ED Discharge Orders    None       Claude Manges, Cordelia Poche 01/24/19 2153    Terrilee Files, MD 01/25/19 1047

## 2019-01-24 NOTE — H&P (Signed)
History and Physical   Mark ScottJeremie C Kral ZOX:[id number] DOB: 04/27/1976 DOA: 01/24/2019  Referring MD/NP/PA: Dr. Silverio LayYao  PCP: Massie MaroonHollis, Lachina M, FNP   Outpatient Specialists: None  Patient coming from: Home  Chief Complaint: Shortness of breath and swelling  HPI: Mark [name] is a 43 y.o. male with medical history significant of systolic dysfunction with EF of 10 to 15% and severe diffuse hypokinesis, nonischemic cardiomyopathy previous cath in 2017, history of right MCA CVA in 2019, history of polysubstance abuse including cocaine, asthma and medication noncompliance who came in today with significant shortness of breath and leg swellings.  Patient apparently ran out of his Lasix for couple of days waiting for it to come in from mail order.  He has noticed gradual swelling of his legs abdomen and face.  Patient also has had progressive shortness of breath during the..  He denied any significant cough.  He has had orthopnea and PND.  No chest pain no fever and no sick contact.  Patient was noted to have gained about 30 pounds over his previous weight.  Also significantly edematous.  He is being admitted with exacerbation of CHF.Marland Kitchen.  ED Course: Temperature is 97.6 blood pressure 115/93 pulse 106 respiratory of 23 oxygen sat 89% on room air.  Sodium is 134 potassium 4.7 chloride 104 CO2 20 with the gap of 10.  Glucose 110 BUN 20 creatinine 1.18.  BNP of 2792.  CBC appear to be within normal.  Urinalysis is negative.  Chest x-ray showed COPD but no acute disease.  Patient received 80 mg IV Lasix and is being admitted to the hospital for treatment.  Review of Systems: As per HPI otherwise 10 point review of systems negative.    Past Medical History:  Diagnosis Date  . Asthma   . Chronic systolic CHF (congestive heart failure) (HCC)   . Cigarette smoker   . CKD (chronic kidney disease), stage II    [name]/notes 10/01/2017  . History of echocardiogram    a. Echo 5/17 - EF 20-25%, severe diff HK, restrictive  physiology, mild to mod MR, severe reduced RVSF, mod RVE, mild RAE, mod TR, PASP 48 mmHg  . Hx of cardiac cath    a. LHC 5/17 - normal coronary arteries. PA 45/25, mean 33, PCWP mean 18  . NICM (nonischemic cardiomyopathy) (HCC)   . Stroke (HCC) 09/27/2017   "was weak on my left side; I'm fully recovered" (11/09/2017)  . Substance abuse (HCC)    cocaine, marijuana    Past Surgical History:  Procedure Laterality Date  . CARDIAC CATHETERIZATION N/A 01/05/2016   Procedure: Right/Left Heart Cath and Coronary Angiography;  Surgeon: Lennette Biharihomas A Kelly, MD;  [facility];  Service: Cardiovascular;  Laterality: N/A;     reports that he quit smoking about 15 months ago. His smoking use included cigarettes. He has a 30.00 pack-year smoking history. He has never used smokeless tobacco. He reports current alcohol use of about 5.0 standard drinks of alcohol per week. He reports current drug use. Drugs: Cocaine and Marijuana.  Allergies  Allergen Reactions  . Hydrocodone Hives  . Lisinopril Swelling and Other (See Comments)    Facial swelling/angioedema  . Prednisone Shortness Of Breath, Nausea Only, Swelling and Other (See Comments)    Also made chest feel tight and genital area, legs, and face became swollen badly  . Penicillins Hives and Swelling     Has patient had a PCN reaction causing immediate rash, facial/tongue/throat swelling, SOB or  lightheadedness with hypotension: Yes Has patient had a PCN reaction causing severe rash involving mucus membranes or skin necrosis: No Has patient had a PCN reaction that required hospitalization: No Has patient had a PCN reaction occurring within the last 10 years: No If all of the above answers are "NO", then may proceed with Cephalosporin use.     Family History  Problem Relation Age of Onset  . Cardiomyopathy Father        Reports his father has an LVAD  . Heart failure Father   . Hypertension Father   . Deep vein thrombosis Neg Hx       Prior to Admission medications   Medication Sig Start Date End Date Taking? Authorizing Provider  albuterol (VENTOLIN HFA) 108 (90 Base) MCG/ACT inhaler Inhale 1-2 puffs into the lungs every 4 (four) hours as needed for wheezing or shortness of breath. 12/30/18   Marguerita Merles Latif, DO  aspirin 81 MG EC tablet Take 1 tablet (81 mg total) by mouth daily. 12/30/18   Marguerita Merles Latif, DO  atorvastatin (LIPITOR) 40 MG tablet Take 1 tablet (40 mg total) by mouth daily at 6 PM. 12/30/18   Marguerita Merles Latif, DO  budesonide-formoterol [facility]) 80-4.5 MCG/ACT inhaler Inhale 2 puffs into the lungs 2 (two) times daily. 10/12/17   Massie Maroon, FNP  digoxin (LANOXIN) 0.125 MG tablet Take 1 tablet (0.125 mg total) by mouth daily. 12/30/18   Marguerita Merles Latif, DO  furosemide (LASIX) 80 MG tablet Take 1 tablet (80 mg total) by mouth daily. 12/31/18   Marguerita Merles Latif, DO  losartan (COZAAR) 25 MG tablet Take 1 tablet (25 mg total) by mouth at bedtime. DISCUSS WITH CARDIOLOGY ABOUT WHEN TO START 12/30/18 03/30/19  Marguerita Merles Latif, DO  Multiple Vitamin (MULTIVITAMIN WITH MINERALS) TABS tablet Take 1 tablet by mouth daily.    [provider]  nicotine (NICODERM CQ - DOSED IN MG/24 HOURS) 14 mg/24hr patch Place 1 patch (14 mg total) onto the skin daily. 12/31/18   Marguerita Merles Latif, DO  spironolactone (ALDACTONE) 25 MG tablet Take 0.5 tablets (12.5 mg total) by mouth daily. 12/31/18   Marguerita Merles [place], DO    Physical Exam: Vitals:   01/24/19 2034 01/24/19 2047 01/24/19 2115 01/24/19 2130  BP:    109/82  Pulse:   (!) 55   Resp: 14  (!) 23   Temp:      TempSrc:      SpO2:  96% (!) 89%       Constitutional: NAD, calm, chronically ill looking Vitals:   01/24/19 2034 01/24/19 2047 01/24/19 2115 01/24/19 2130  BP:    109/82  Pulse:   (!) 55   Resp: 14  (!) 23   Temp:      TempSrc:      SpO2:  96% (!) 89%    Eyes: PERRL, lids and conjunctivae normal ENMT: Mucous membranes  are moist. Posterior pharynx clear of any exudate or lesions.Normal dentition.  Neck: normal, supple, no masses, no thyromegaly Respiratory: Decreased air entry bilaterally, widespread bilateral crackles, Normal respiratory effort. No accessory muscle use.  Cardiovascular: Sinus tachycardia with systolic ejection murmur, no rubs / gallops.  2+ extremity edema. 2+ pedal pulses. No carotid bruits.  Abdomen: no tenderness, no masses palpated. No hepatosplenomegaly. Bowel sounds positive.  Musculoskeletal: no clubbing / cyanosis. No joint deformity upper and lower extremities. Good ROM, no contractures. Normal muscle tone.  Skin: no rashes, lesions, ulcers. No induration Neurologic: CN  2-12 grossly intact. Sensation intact, DTR normal. Strength 5/5 in all 4.  Psychiatric: Normal judgment and insight. Alert and oriented x 3. Normal mood.     Labs on Admission: I have personally reviewed following labs and imaging studies  CBC: Recent Labs  Lab 01/24/19 1955  WBC 4.4  NEUTROABS 2.3  HGB 14.2  HCT 45.5  MCV 82.7  PLT 192   Basic Metabolic Panel: Recent Labs  Lab 01/24/19 1955  NA 134*  K 4.7  CL 104  CO2 20*  GLUCOSE 110*  BUN 20  CREATININE 1.18  CALCIUM 9.1   GFR: CrCl cannot be calculated (Unknown ideal weight.). Liver Function Tests: Recent Labs  Lab 01/24/19 1955  AST 71*  ALT 47*  ALKPHOS 122  BILITOT 3.3*  PROT 7.5  ALBUMIN 3.2*   No results for input(s): LIPASE, AMYLASE in the last 168 hours. No results for input(s): AMMONIA in the last 168 hours. Coagulation Profile: No results for input(s): INR, PROTIME in the last 168 hours. Cardiac Enzymes: No results for input(s): CKTOTAL, CKMB, CKMBINDEX, TROPONINI in the last 168 hours. BNP (last 3 results) No results for input(s): PROBNP in the last 8760 hours. HbA1C: No results for input(s): HGBA1C in the last 72 hours. CBG: No results for input(s): GLUCAP in the last 168 hours. Lipid Profile: No results for  input(s): CHOL, HDL, LDLCALC, TRIG, CHOLHDL, LDLDIRECT in the last 72 hours. Thyroid Function Tests: No results for input(s): TSH, T4TOTAL, FREET4, T3FREE, THYROIDAB in the last 72 hours. Anemia Panel: No results for input(s): VITAMINB12, FOLATE, FERRITIN, TIBC, IRON, RETICCTPCT in the last 72 hours. Urine analysis:    Component Value Date/Time   COLORURINE STRAW (A) 01/24/2019 1955   APPEARANCEUR CLEAR 01/24/2019 1955   LABSPEC 1.005 01/24/2019 1955   PHURINE 5.0 01/24/2019 1955   GLUCOSEU NEGATIVE 01/24/2019 1955   HGBUR NEGATIVE 01/24/2019 1955   BILIRUBINUR NEGATIVE 01/24/2019 1955   BILIRUBINUR negative 12/01/2017 0910   KETONESUR NEGATIVE 01/24/2019 1955   PROTEINUR NEGATIVE 01/24/2019 1955   UROBILINOGEN 2.0 (A) 12/01/2017 0910   NITRITE NEGATIVE 01/24/2019 1955   LEUKOCYTESUR NEGATIVE 01/24/2019 1955   Sepsis Labs: (procalcitonin:4,lacticidven:4) )No results found for this or any previous visit (from the past 240 hour(s)).   Radiological Exams on Admission: Dg Chest Port 1 View  Result Date: 01/24/2019 CLINICAL DATA:  Shortness of breath. EXAM: PORTABLE CHEST 1 VIEW COMPARISON:  Chest x-ray dated November 09, 2017. FINDINGS: Stable mild cardiomegaly. Normal mediastinal contours. Normal pulmonary vascularity. The lungs remain hyperinflated with emphysematous changes. No focal consolidation, pleural effusion, or pneumothorax. No acute osseous abnormality. IMPRESSION: 1. No active disease. 2. COPD. Electronically Signed   By: Obie Dredge M.D.   On: 01/24/2019 20:32    EKG: Independently reviewed.  It shows sinus tachycardia with a rate of 105.  Abnormal Q waves all through with anterior infarct.  No significant change from previous  Assessment/Plan Principal Problem:   Acute on chronic systolic congestive heart failure (HCC) Active Problems:   Tobacco abuse   Chronic systolic heart failure (HCC)   CKD (chronic kidney disease) stage 2, GFR 60-89 ml/min     #1  acute decompensated CHF: Patient's most recent echocardiogram on May 5 showed EF of 5 to 10%.  Severely compromised left ventricular function.  Being out of Lasix most of contributed.  Noncompliance definitely an issue.  He will be admitted.  Switch Lasix to IV.  Continue other cardiac medications.  May need cardiac consultation but it  appears patient needs to just be back on his medications.  #2 chronic kidney disease stage II: Continue treatment.  Monitor renal function.  #3 tobacco abuse: Continue with nicotine patch and tobacco cessation counseling  #4 polysubstance abuse: Check urine drug screen.   DVT prophylaxis: Heparin Code Status: Full code Family Communication: Discussed with patient no family available Disposition Plan: Home Consults called: None Admission status: Inpatient  Severity of Illness: The appropriate patient status for this patient is INPATIENT. Inpatient status is judged to be reasonable and necessary in order to provide the required intensity of service to ensure the patient's safety. The patient's presenting symptoms, physical exam findings, and initial radiographic and laboratory data in the context of their chronic comorbidities is felt to place them at high risk for further clinical deterioration. Furthermore, it is not anticipated that the patient will be medically stable for discharge from the hospital within 2 midnights of admission. The following factors support the patient status of inpatient.   " The patient's presenting symptoms include shortness of breath and swelling. " The worrisome physical exam findings include bilateral lower extremity edema. " The initial radiographic and laboratory data are worrisome because of BNP of 2800. " The chronic co-morbidities include acute systolic CHF.   * I certify that at the point of admission it is my clinical judgment that the patient will require inpatient hospital care spanning beyond 2 midnights from the point of  admission due to high intensity of service, high risk for further deterioration and high frequency of surveillance required.Lonia Blood MD [facility] Pager [phone]  If 7PM-7AM, please contact night-coverage [email or web address] Password [email or web address]  01/24/2019, 10:07 PM

## 2019-01-24 NOTE — ED Notes (Signed)
Pt adamantly refused covid test, saying he was tested a week ago at [facility]. I had the supervising doctor talk to him and he still refused.

## 2019-01-24 NOTE — ED Triage Notes (Signed)
BIB [facility] from home with c/o of continued shortness of breath. Seen at [facility] last week and states they did nothing for him. Also reports he feels like his face is swollen. No noticeable angioedema in arrival.

## 2019-01-24 NOTE — ED Notes (Signed)
ED TO INPATIENT HANDOFF REPORT  ED Nurse Name and Phone #: Lanora Manis [phone]  S Name/Age/Gender Mark [name] 43 y.o. male Room/Bed: 027C/027C  Code Status   Code Status: Prior  Home/SNF/Other Home Patient oriented to: self, place, time and situation Is this baseline? Yes   Triage Complete: Triage complete  Chief Complaint sob  Triage Note BIB [facility] from home with c/o of continued shortness of breath. Seen at [place] last week and states they did nothing for him. Also reports he feels like his face is swollen. No noticeable angioedema in arrival.    Allergies Allergies  Allergen Reactions  . Hydrocodone Hives  . Lisinopril Swelling and Other (See Comments)    Facial swelling/angioedema  . Prednisone Shortness Of Breath, Nausea Only, Swelling and Other (See Comments)    Also made chest feel tight and genital area, legs, and face became swollen badly  . Penicillins Hives and Swelling     Has patient had a PCN reaction causing immediate rash, facial/tongue/throat swelling, SOB or lightheadedness with hypotension: Yes Has patient had a PCN reaction causing severe rash involving mucus membranes or skin necrosis: No Has patient had a PCN reaction that required hospitalization: No Has patient had a PCN reaction occurring within the last 10 years: No If all of the above answers are "NO", then may proceed with Cephalosporin use.     Level of Care/Admitting Diagnosis ED Disposition    ED Disposition Condition Comment   Admit  Hospital Area: [name] [[id number]]  Level of Care: Telemetry Cardiac [103]  Covid Evaluation: N/A  Diagnosis: Acute CHF (congestive heart failure) (HCC) [383818]  Admitting Physician: Rometta Emery [[id number]]  Attending Physician: Rometta Emery [[id number]]  Estimated length of stay: past midnight tomorrow  Certification:: I certify this patient will need inpatient services for at least 2 midnights  PT Class (Do Not Modify):  Inpatient [101]  PT Acc Code (Do Not Modify): Private [1]       B Medical/Surgery History Past Medical History:  Diagnosis Date  . Asthma   . Chronic systolic CHF (congestive heart failure) (HCC)   . Cigarette smoker   . CKD (chronic kidney disease), stage II    Mark [name] 10/01/2017  . History of echocardiogram    a. Echo 5/17 - EF 20-25%, severe diff HK, restrictive physiology, mild to mod MR, severe reduced RVSF, mod RVE, mild RAE, mod TR, PASP 48 mmHg  . Hx of cardiac cath    a. LHC 5/17 - normal coronary arteries. PA 45/25, mean 33, PCWP mean 18  . NICM (nonischemic cardiomyopathy) (HCC)   . Stroke (HCC) 09/27/2017   "was weak on my left side; I'm fully recovered" (11/09/2017)  . Substance abuse (HCC)    cocaine, marijuana   Past Surgical History:  Procedure Laterality Date  . CARDIAC CATHETERIZATION N/A 01/05/2016   Procedure: Right/Left Heart Cath and Coronary Angiography;  Surgeon: Lennette Bihari, MD;  [facility] INVASIVE CV LAB;  Service: Cardiovascular;  Laterality: N/A;     A IV Location/Drains/Wounds Patient Lines/Drains/Airways Status   Active Line/Drains/Airways    Name:   Placement date:   Placement time:   Site:   Days:   Peripheral IV 01/24/19 Right Forearm   01/24/19    1930    Forearm   less than 1          Intake/Output Last 24 hours No intake or output data in the 24 hours ending 01/24/19 2238  Labs/Imaging Results  for orders placed or performed during the hospital encounter of 01/24/19 (from the past 48 hour(s))  CBC with Differential     Status: Abnormal   Collection Time: 01/24/19  7:55 PM  Result Value Ref Range   WBC 4.4 4.0 - 10.5 K/uL   RBC 5.50 4.22 - 5.81 MIL/uL   Hemoglobin 14.2 13.0 - 17.0 g/dL   HCT 20.1 00.7 - 12.1 %   MCV 82.7 80.0 - 100.0 fL   MCH 25.8 (L) 26.0 - 34.0 pg   MCHC 31.2 30.0 - 36.0 g/dL   RDW 97.5 (H) 88.3 - 25.4 %   Platelets 192 150 - 400 K/uL   nRBC 0.9 (H) 0.0 - 0.2 %   Neutrophils Relative % 50 %   Neutro  Abs 2.3 1.7 - 7.7 K/uL   Lymphocytes Relative 37 %   Lymphs Abs 1.6 0.7 - 4.0 K/uL   Monocytes Relative 10 %   Monocytes Absolute 0.4 0.1 - 1.0 K/uL   Eosinophils Relative 1 %   Eosinophils Absolute 0.0 0.0 - 0.5 K/uL   Basophils Relative 1 %   Basophils Absolute 0.0 0.0 - 0.1 K/uL   Immature Granulocytes 1 %   Abs Immature Granulocytes 0.02 0.00 - 0.07 K/uL    Comment: Performed at [facility], [address], [place], [place] [address]  Comprehensive metabolic panel     Status: Abnormal   Collection Time: 01/24/19  7:55 PM  Result Value Ref Range   Sodium 134 (L) 135 - 145 mmol/L   Potassium 4.7 3.5 - 5.1 mmol/L   Chloride 104 98 - 111 mmol/L   CO2 20 (L) 22 - 32 mmol/L   Glucose, Bld 110 (H) 70 - 99 mg/dL   BUN 20 6 - 20 mg/dL   Creatinine, Ser 1.58 0.61 - 1.24 mg/dL   Calcium 9.1 8.9 - 30.9 mg/dL   Total Protein 7.5 6.5 - 8.1 g/dL   Albumin 3.2 (L) 3.5 - 5.0 g/dL   AST 71 (H) 15 - 41 U/L   ALT 47 (H) 0 - 44 U/L   Alkaline Phosphatase 122 38 - 126 U/L   Total Bilirubin 3.3 (H) 0.3 - 1.2 mg/dL   GFR calc non Af Amer >60 >60 mL/min   GFR calc Af Amer >60 >60 mL/min   Anion gap 10 5 - 15    Comment: Performed at [facility], [address] [place], [place] [address]  Brain natriuretic peptide     Status: Abnormal   Collection Time: 01/24/19  7:55 PM  Result Value Ref Range   B Natriuretic Peptide 2,792.5 (H) 0.0 - 100.0 pg/mL    Comment: Performed at [facility], [address] [place], [place] [address]  Urinalysis, Routine w reflex microscopic     Status: Abnormal   Collection Time: 01/24/19  7:55 PM  Result Value Ref Range   Color, Urine STRAW (A) YELLOW   APPearance CLEAR CLEAR   Specific Gravity, Urine 1.005 1.005 - 1.030   pH 5.0 5.0 - 8.0   Glucose, UA NEGATIVE NEGATIVE mg/dL   Hgb urine dipstick NEGATIVE NEGATIVE   Bilirubin Urine NEGATIVE NEGATIVE   Ketones, ur NEGATIVE NEGATIVE mg/dL   Protein, ur NEGATIVE NEGATIVE mg/dL   Nitrite  NEGATIVE NEGATIVE   Leukocytes,Ua NEGATIVE NEGATIVE    Comment: Performed at [facility], [address], [place], [place] [address]  I-Stat Troponin, ED (not at [facility])  Status: None   Collection Time: 01/24/19  8:13 PM  Result Value Ref Range   Troponin i, poc 0.06 0.00 - 0.08 ng/mL   Comment 3            Comment: Due to the release kinetics of cTnI, a negative result within the first hours of the onset of symptoms does not rule out myocardial infarction with certainty. If myocardial infarction is still suspected, repeat the test at appropriate intervals.    Dg Chest Port 1 View  Result Date: 01/24/2019 CLINICAL DATA:  Shortness of breath. EXAM: PORTABLE CHEST 1 VIEW COMPARISON:  Chest x-ray dated November 09, 2017. FINDINGS: Stable mild cardiomegaly. Normal mediastinal contours. Normal pulmonary vascularity. The lungs remain hyperinflated with emphysematous changes. No focal consolidation, pleural effusion, or pneumothorax. No acute osseous abnormality. IMPRESSION: 1. No active disease. 2. COPD. Electronically Signed   By: Obie Dredge M.D.   On: 01/24/2019 20:32    Pending Labs Unresulted Labs (From admission, onward)    Start     Ordered   01/24/19 2138  SARS Coronavirus 2 [facility] order, Performed in [facility] hospital lab)  (Novel Coronavirus, [facility] Order))  Once,   R    Question:  Patient immune status  Answer:  Normal   01/24/19 2138   Signed and Held  Basic metabolic panel  Daily,   R     Signed and Held   Signed and Held  CBC  (heparin)  Once,   R    Comments:  Baseline for heparin therapy IF NOT ALREADY DRAWN.  Notify MD if PLT < 100 K.    Signed and Held   Signed and Held  Creatinine, serum  (heparin)  Once,   R    Comments:  Baseline for heparin therapy IF NOT ALREADY DRAWN.    Signed and Held   Signed and Held  CBC WITH DIFFERENTIAL  Tomorrow morning,   R     Signed and Held          Vitals/Pain Today's Vitals   01/24/19 2034 01/24/19 2047  01/24/19 2115 01/24/19 2130  BP:    109/82  Pulse:   (!) 55   Resp: 14  (!) 23   Temp:      TempSrc:      SpO2:  96% (!) 89%   PainSc:        Isolation Precautions Airborne and Contact precautions  Medications Medications  furosemide (LASIX) injection 40 mg (40 mg Intravenous Given 01/24/19 2158)    Mobility walks Low fall risk   Focused Assessments Pulmonary Assessment Handoff:  Lung sounds: Bilateral Breath Sounds: Diminished, Clear O2 Device: Room Air        R Recommendations: See Admitting Provider Note  Report given to:   Additional Notes:

## 2019-01-25 LAB — BASIC METABOLIC PANEL
Anion gap: 9 (ref 5–15)
BUN: 22 mg/dL — ABNORMAL HIGH (ref 6–20)
CO2: 29 mmol/L (ref 22–32)
Calcium: 8.7 mg/dL — ABNORMAL LOW (ref 8.9–10.3)
Chloride: 99 mmol/L (ref 98–111)
Creatinine, Ser: 1.29 mg/dL — ABNORMAL HIGH (ref 0.61–1.24)
GFR calc Af Amer: >60 mL/min (ref >60)
GFR calc non Af Amer: >60 mL/min (ref >60)
Glucose, Bld: 126 mg/dL — ABNORMAL HIGH (ref 70–99)
Potassium: 4.7 mmol/L (ref 3.5–5.1)
Sodium: 137 mmol/L (ref 135–145)

## 2019-01-25 LAB — CBC WITH DIFFERENTIAL/PLATELET
Abs Immature Granulocytes: 0.02 10*3/uL (ref 0.00–0.07)
Basophils Absolute: 0 10*3/uL (ref 0.0–0.1)
Basophils Relative: 1 %
Eosinophils Absolute: 0.1 10*3/uL (ref 0.0–0.5)
Eosinophils Relative: 1 %
HCT: 37.8 % — ABNORMAL LOW (ref 39.0–52.0)
Hemoglobin: 12.1 g/dL — ABNORMAL LOW (ref 13.0–17.0)
Immature Granulocytes: 1 %
Lymphocytes Relative: 40 %
Lymphs Abs: 1.8 10*3/uL (ref 0.7–4.0)
MCH: 25.9 pg — ABNORMAL LOW (ref 26.0–34.0)
MCHC: 32 g/dL (ref 30.0–36.0)
MCV: 80.8 fL (ref 80.0–100.0)
Monocytes Absolute: 0.6 10*3/uL (ref 0.1–1.0)
Monocytes Relative: 14 %
Neutro Abs: 1.9 10*3/uL (ref 1.7–7.7)
Neutrophils Relative %: 43 %
Platelets: 163 10*3/uL (ref 150–400)
RBC: 4.68 MIL/uL (ref 4.22–5.81)
RDW: 19.1 % — ABNORMAL HIGH (ref 11.5–15.5)
WBC: 4.4 10*3/uL (ref 4.0–10.5)
nRBC: 0.5 % — ABNORMAL HIGH (ref 0.0–0.2)

## 2019-01-25 LAB — SARS CORONAVIRUS 2: SARS Coronavirus 2: NOT DETECTED

## 2019-01-25 MED ORDER — FUROSEMIDE 10 MG/ML IJ SOLN
80.0000 mg | Freq: Two times a day (BID) | INTRAMUSCULAR | Status: DC
  Administered 2019-01-25: 80 mg via INTRAVENOUS
  Filled 2019-01-25: qty 8

## 2019-01-25 MED ORDER — NICOTINE 14 MG/24HR TD PT24
14.0000 mg | MEDICATED_PATCH | Freq: Every day | TRANSDERMAL | Status: DC
  Administered 2019-01-25: 14 mg via TRANSDERMAL
  Filled 2019-01-25 (×2): qty 1

## 2019-01-25 MED ORDER — SODIUM CHLORIDE 0.9% FLUSH
3.0000 mL | Freq: Two times a day (BID) | INTRAVENOUS | Status: DC
  Administered 2019-01-25 (×2): 3 mL via INTRAVENOUS

## 2019-01-25 MED ORDER — ATORVASTATIN CALCIUM 40 MG PO TABS: 40.0000 mg | ORAL_TABLET | Freq: Every day | ORAL | Status: DC

## 2019-01-25 MED ORDER — SPIRONOLACTONE 25 MG PO TABS: 12.5000 mg | ORAL_TABLET | Freq: Every day | ORAL | 1 refills | Status: DC

## 2019-01-25 MED ORDER — SPIRONOLACTONE 12.5 MG HALF TABLET
12.5000 mg | ORAL_TABLET | Freq: Every day | ORAL | Status: DC
  Administered 2019-01-25: 12.5 mg via ORAL
  Filled 2019-01-25: qty 1

## 2019-01-25 MED ORDER — HEPARIN SODIUM (PORCINE) 5000 UNIT/ML IJ SOLN: 5000.0000 [IU] | Freq: Three times a day (TID) | INTRAMUSCULAR | Status: DC

## 2019-01-25 MED ORDER — SODIUM CHLORIDE 0.9 % IV SOLN: 250.0000 mL | INTRAVENOUS | Status: DC | PRN

## 2019-01-25 MED ORDER — ASPIRIN EC 81 MG PO TBEC
81.0000 mg | DELAYED_RELEASE_TABLET | Freq: Every day | ORAL | Status: DC
  Administered 2019-01-25: 81 mg via ORAL
  Filled 2019-01-25: qty 1

## 2019-01-25 MED ORDER — SODIUM CHLORIDE 0.9% FLUSH: 3.0000 mL | INTRAVENOUS | Status: DC | PRN

## 2019-01-25 MED ORDER — MOMETASONE FURO-FORMOTEROL FUM 100-5 MCG/ACT IN AERO
2.0000 | INHALATION_SPRAY | Freq: Two times a day (BID) | RESPIRATORY_TRACT | Status: DC
  Administered 2019-01-25: 2 via RESPIRATORY_TRACT
  Filled 2019-01-25: qty 8.8

## 2019-01-25 MED ORDER — ONDANSETRON HCL 4 MG/2ML IJ SOLN: 4.0000 mg | Freq: Four times a day (QID) | INTRAMUSCULAR | Status: DC | PRN

## 2019-01-25 MED ORDER — DIGOXIN 125 MCG PO TABS
0.1250 mg | ORAL_TABLET | Freq: Every day | ORAL | Status: DC
  Administered 2019-01-25: 0.125 mg via ORAL
  Filled 2019-01-25: qty 1

## 2019-01-25 MED ORDER — LOSARTAN POTASSIUM 25 MG PO TABS
25.0000 mg | ORAL_TABLET | Freq: Every day | ORAL | Status: DC
  Administered 2019-01-25: 25 mg via ORAL
  Filled 2019-01-25: qty 1

## 2019-01-25 MED ORDER — ACETAMINOPHEN 325 MG PO TABS: 650.0000 mg | ORAL_TABLET | ORAL | Status: DC | PRN

## 2019-01-25 MED ORDER — ADULT MULTIVITAMIN W/MINERALS CH
1.0000 | ORAL_TABLET | Freq: Every day | ORAL | Status: DC
  Filled 2019-01-25: qty 1

## 2019-01-25 MED ORDER — ALBUTEROL SULFATE (2.5 MG/3ML) 0.083% IN NEBU: 2.5000 mg | INHALATION_SOLUTION | RESPIRATORY_TRACT | Status: DC | PRN

## 2019-01-25 NOTE — TOC Transition Note (Signed)
Transition of Care [facility]) - CM/SW Discharge Note   Patient Details  Name: Mark [name] MRN: [id number] Date of Birth: 1976-01-13  Transition of Care [facility]) CM/SW Contact:  Leone Haven, RN Phone Number: 01/25/2019, 11:20 AM   Clinical Narrative:    From home, he goes to [facility], he has follow up apt scheduled for telehealth on 6/11.  He will get medications from [facility].  He is for dc today, he has transportation.  He has no other needs.   Final next level of care: Home/Self Care Barriers to Discharge: No Barriers Identified   Patient Goals and CMS Choice Patient states their goals for this hospitalization and ongoing recovery are:: get better   Choice offered to / list presented to : NA  Discharge Placement                       Discharge Plan and Services   Discharge Planning Services: CM Consult, [facility], Medication Assistance Post Acute Care Choice: NA          DME Arranged: N/A DME Agency: NA       HH Arranged: NA HH Agency: NA        Social Determinants of Health (SDOH) Interventions     Readmission Risk Interventions Readmission Risk Prevention Plan 01/25/2019  Transportation Screening Complete  PCP or Specialist Appt within 3-5 Days Complete  HRI or Home Care Consult Complete  Social Work Consult for Recovery Care Planning/Counseling Complete  Palliative Care Screening Complete  Medication Review Oceanographer) Complete  Some recent data might be hidden

## 2019-01-25 NOTE — Plan of Care (Signed)

## 2019-01-25 NOTE — Discharge Summary (Addendum)
Physician Discharge Summary  Mark [name] CBS:[id number] DOB: [birth year] DOA: 01/24/2019  PCP: Massie Maroon, FNP  Admit date: 01/24/2019 Discharge date: 01/25/2019  Admitted From: Home Disposition: Home Recommendations for Outpatient Follow-up:  1. Follow up with PCP in 1-2 weeks 2. Please obtain BMP/CBC in one week 3. Follow up with cardiology  Home Health: None Equipment/Devices: None Discharge Condition: Stable and improved CODE STATUS full code Diet recommendation: Cardiac  Brief/Interim Summary:42 y.o. male with medical history significant of systolic dysfunction with EF of 10 to 15% and severe diffuse hypokinesis, nonischemic cardiomyopathy previous cath in 2017, history of right MCA CVA in 2019, history of polysubstance abuse including cocaine, asthma and medication noncompliance who came in today with significant shortness of breath and leg swellings.  Patient apparently ran out of his Lasix for couple of days waiting for it to come in from mail order.  He has noticed gradual swelling of his legs abdomen and face.  Patient also has had progressive shortness of breath during the..  He denied any significant cough.  He has had orthopnea and PND.  No chest pain no fever and no sick contact.  Patient was noted to have gained about 30 pounds over his previous weight.  Also significantly edematous.  He is being admitted with exacerbation of CHF.Marland Kitchen  ED Course: Temperature is 97.6 blood pressure 115/93 pulse 106 respiratory of 23 oxygen sat 89% on room air.  Sodium is 134 potassium 4.7 chloride 104 CO2 20 with the gap of 10.  Glucose 110 BUN 20 creatinine 1.18.  BNP of 2792.  CBC appear to be within normal.  Urinalysis is negative.  Chest x-ray showed COPD but no acute disease.  Patient received 80 mg IV Lasix and is being admitted to the hospital for treatment.  Discharge Diagnoses:  Principal Problem:   Acute on chronic systolic congestive heart failure (HCC) Active Problems:   Tobacco  abuse   Chronic systolic heart failure (HCC)   CKD (chronic kidney disease) stage 2, GFR 60-89 ml/min  #1 acute decompensated CHF: Secondary to noncompliance with medications and diet.  Patient continues to smoke.  He ran out of his medication so he did not fill them.  He continues to use cocaine.  Patient feels he is back to baseline.  He is walking to the bathroom without any difficulty of shortness of breath or dyspnea on exertion.  I [facility]  All the prescriptions are being filled and [facility].  Did you Lasix 80 mg daily.  Patient was on Lasix 80 twice a day IV since admission.  His creatinine has appropriately bumped up with the diuretics. Patient's most recent echocardiogram on May 5 showed EF of 5 to 10%.  Severely compromised left ventricular function.  His saturation on discharge is 100% on room air.  #2 chronic kidney disease stage II: Stable continue to monitor renal functions as an outpatient on Lasix and Aldactone.   #3 tobacco abuse: Continue with nicotine patch and tobacco cessation counseling done.  #4 polysubstance abuse:  Urine drug screen was positive for cocaine.  Consult for cessation.  Estimated body mass index is 21.86 kg/m as calculated from the following:   Height as of this encounter: 5\' 11"  (1.803 m).   Weight as of this encounter: 71.1 kg.  Discharge Instructions  Discharge Instructions    Call MD for:  difficulty breathing, headache or visual disturbances  Complete by:  As directed    Diet - low sodium heart healthy   Complete by:  As directed    Increase activity slowly   Complete by:  As directed      Allergies as of 01/25/2019      Reactions   Hydrocodone Hives   Lisinopril Swelling, Other (See Comments)   Facial swelling/angioedema   Prednisone Shortness Of Breath, Nausea Only, Swelling, Other (See Comments)   Also made chest feel tight and genital  area, legs, and face became swollen badly   Penicillins Hives, Swelling   Has patient had a PCN reaction causing immediate rash, facial/tongue/throat swelling, SOB or lightheadedness with hypotension: Yes Has patient had a PCN reaction causing severe rash involving mucus membranes or skin necrosis: No Has patient had a PCN reaction that required hospitalization: No Has patient had a PCN reaction occurring within the last 10 years: No If all of the above answers are "NO", then may proceed with Cephalosporin use.      Medication List    TAKE these medications   albuterol 108 (90 Base) MCG/ACT inhaler Commonly known as:  VENTOLIN HFA Inhale 1-2 puffs into the lungs every 4 (four) hours as needed for wheezing or shortness of breath.   aspirin 81 MG EC tablet Take 1 tablet (81 mg total) by mouth daily. What changed:  how much to take   budesonide-formoterol 80-4.5 MCG/ACT inhaler Commonly known as:  SYMBICORT Inhale 2 puffs into the lungs 2 (two) times daily.   digoxin 0.125 MG tablet Commonly known as:  LANOXIN Take 1 tablet (0.125 mg total) by mouth daily.   furosemide 80 MG tablet Commonly known as:  LASIX Take 1 tablet (80 mg total) by mouth daily.   losartan 25 MG tablet Commonly known as:  COZAAR Take 1 tablet (25 mg total) by mouth at bedtime. DISCUSS WITH CARDIOLOGY ABOUT WHEN TO START What changed:  when to take this   multivitamin with minerals Tabs tablet Take 1 tablet by mouth daily.   nicotine 14 mg/24hr patch Commonly known as:  NICODERM CQ - dosed in mg/24 hours Place 1 patch (14 mg total) onto the skin daily.   spironolactone 25 MG tablet Commonly known as:  ALDACTONE Take 0.5 tablets (12.5 mg total) by mouth daily. Start taking on:  January 26, 2019      Follow-up Information    [facility] Follow up.   Contact information: [address] [place] [address] [phone]         Allergies   Allergen Reactions  . Hydrocodone Hives  . Lisinopril Swelling and Other (See Comments)    Facial swelling/angioedema  . Prednisone Shortness Of Breath, Nausea Only, Swelling and Other (See Comments)    Also made chest feel tight and genital area, legs, and face became swollen badly  . Penicillins Hives and Swelling     Has patient had a PCN reaction causing immediate rash, facial/tongue/throat swelling, SOB or lightheadedness with hypotension: Yes Has patient had a PCN reaction causing severe rash involving mucus membranes or skin necrosis: No Has patient had a PCN reaction that required hospitalization: No Has patient had a PCN reaction occurring within the last 10 years: No If all of the above answers are "NO", then may proceed with Cephalosporin use.     Consultations: None  Procedures/Studies: Dg Chest Port 1 View  Result Date: 01/24/2019 CLINICAL DATA:  Shortness of breath. EXAM: PORTABLE CHEST 1 VIEW  COMPARISON:  Chest x-ray dated November 09, 2017. FINDINGS: Stable mild cardiomegaly. Normal mediastinal contours. Normal pulmonary vascularity. The lungs remain hyperinflated with emphysematous changes. No focal consolidation, pleural effusion, or pneumothorax. No acute osseous abnormality. IMPRESSION: 1. No active disease. 2. COPD. Electronically Signed   By: Obie Dredge M.D.   On: 01/24/2019 20:32   Dg Chest Port 1 View  Result Date: 12/27/2018 CLINICAL DATA:  Shortness of breath. EXAM: PORTABLE CHEST 1 VIEW COMPARISON:  Chest x-ray dated Dec 24, 2018. FINDINGS: Stable mild cardiomegaly. Normal mediastinal contours. Normal pulmonary vascularity. No focal consolidation, pleural effusion, or pneumothorax. No acute osseous abnormality. IMPRESSION: No active disease. Electronically Signed   By: Obie Dredge M.D.   On: 12/27/2018 08:00   Ct Maxillofacial Wo Contrast  Result Date: 12/26/2018 CLINICAL DATA:  Facial pain and swelling EXAM: CT MAXILLOFACIAL WITHOUT CONTRAST TECHNIQUE:  Multidetector CT imaging of the maxillofacial structures was performed. Multiplanar CT image reconstructions were also generated. COMPARISON:  None. FINDINGS: Osseous: No acute fracture is identified. Diffuse dental caries are identified. No periapical abscess is noted. Orbits: Orbits are within normal limits. Sinuses: Paranasal sinuses demonstrate mucosal thickening bilaterally within the maxillary antra. Air-fluid level is noted within the left maxillary antrum consistent with acute sinusitis. Soft tissues: Soft tissue swelling is noted in the left face surrounding the maxilla and mandible. No focal hematoma is noted. Some subcutaneous air is noted along the anterior aspect of the maxilla. This likely represents some localized infection. Limited intracranial: Within normal limits. IMPRESSION: No acute bony abnormality is noted. Soft tissue swelling is seen with some subcutaneous air consistent with focal inflammatory change. Multiple dental caries are noted. Inflammatory changes in the paranasal sinuses with air-fluid level in the left maxillary antrum consistent with acute sinusitis. Electronically Signed   By: Alcide Clever M.D.   On: 12/26/2018 21:35    (Echo, Carotid, EGD, Colonoscopy, ERCP)    Subjective: Patient resting in bed denies any chest pain shortness of breath nausea vomiting.  He ambulated to the restroom without any difficulty.    Discharge Exam: Vitals:   01/25/19 0141 01/25/19 0700  BP: (!) 114/95 (!) 112/93  Pulse: (!) 48 90  Resp:  (!) 22  Temp: (!) 97.5 F (36.4 C) 97.8 F (36.6 C)  SpO2: 100% 100%   Vitals:   01/25/19 0141 01/25/19 0150 01/25/19 0239 01/25/19 0700  BP: (!) 114/95   (!) 112/93  Pulse: (!) 48   90  Resp:    (!) 22  Temp: (!) 97.5 F (36.4 C)   97.8 F (36.6 C)  TempSrc: Oral   Oral  SpO2: 100%   100%  Weight:  71.1 kg    Height:   5\' 11"  (1.803 m)     General: Pt is alert, awake, not in acute distress Cardiovascular: RRR, S1/S2 +, no rubs, no  gallops Respiratory: Few Rales bilaterally, no wheezing, no rhonchi Abdominal: Soft, NT, ND, bowel sounds + Extremities: trace edema, no cyanosis    The results of significant diagnostics from this hospitalization (including imaging, microbiology, ancillary and laboratory) are listed below for reference.     Microbiology: No results found for this or any previous visit (from the past 240 hour(s)).   Labs: BNP (last 3 results) Recent Labs    12/24/18 2237 12/28/18 0251 01/24/19 1955  BNP 3,973.6* 848.5* 2,792.5*   Basic Metabolic Panel: Recent Labs  Lab 01/24/19 1955 01/25/19 0606  NA 134* 137  K 4.7 4.7  CL 104  99  CO2 20* 29  GLUCOSE 110* 126*  BUN 20 22*  CREATININE 1.18 1.29*  CALCIUM 9.1 8.7*   Liver Function Tests: Recent Labs  Lab 01/24/19 1955  AST 71*  ALT 47*  ALKPHOS 122  BILITOT 3.3*  PROT 7.5  ALBUMIN 3.2*   No results for input(s): LIPASE, AMYLASE in the last 168 hours. No results for input(s): AMMONIA in the last 168 hours. CBC: Recent Labs  Lab 01/24/19 1955 01/25/19 0606  WBC 4.4 4.4  NEUTROABS 2.3 1.9  HGB 14.2 12.1*  HCT 45.5 37.8*  MCV 82.7 80.8  PLT 192 163   Cardiac Enzymes: No results for input(s): CKTOTAL, CKMB, CKMBINDEX, TROPONINI in the last 168 hours. BNP: Invalid input(s): POCBNP CBG: No results for input(s): GLUCAP in the last 168 hours. D-Dimer No results for input(s): DDIMER in the last 72 hours. Hgb A1c No results for input(s): HGBA1C in the last 72 hours. Lipid Profile No results for input(s): CHOL, HDL, LDLCALC, TRIG, CHOLHDL, LDLDIRECT in the last 72 hours. Thyroid function studies No results for input(s): TSH, T4TOTAL, T3FREE, THYROIDAB in the last 72 hours.  Invalid input(s): FREET3 Anemia work up No results for input(s): VITAMINB12, FOLATE, FERRITIN, TIBC, IRON, RETICCTPCT in the last 72 hours. Urinalysis    Component Value Date/Time   COLORURINE STRAW (A) 01/24/2019 1955   APPEARANCEUR CLEAR  01/24/2019 1955   LABSPEC 1.005 01/24/2019 1955   PHURINE 5.0 01/24/2019 1955   GLUCOSEU NEGATIVE 01/24/2019 1955   HGBUR NEGATIVE 01/24/2019 1955   BILIRUBINUR NEGATIVE 01/24/2019 1955   BILIRUBINUR negative 12/01/2017 0910   KETONESUR NEGATIVE 01/24/2019 1955   PROTEINUR NEGATIVE 01/24/2019 1955   UROBILINOGEN 2.0 (A) 12/01/2017 0910   NITRITE NEGATIVE 01/24/2019 1955   LEUKOCYTESUR NEGATIVE 01/24/2019 1955   Sepsis Labs Invalid input(s): PROCALCITONIN,  WBC,  LACTICIDVEN Microbiology No results found for this or any previous visit (from the past 240 hour(s)).   Time coordinating discharge: 33 minutes  SIGNED:   Alwyn RenElizabeth G , MD  [facility] 01/25/2019, 9:22 AM Pager   If 7PM-7AM, please contact night-coverage [email or web address] Password [email or web address]

## 2019-01-25 NOTE — Discharge Instructions (Signed)
YOUR CARDIOLOGY TEAM HAS ARRANGED FOR AN E-VISIT FOR YOUR APPOINTMENT - PLEASE REVIEW IMPORTANT INFORMATION BELOW SEVERAL DAYS PRIOR TO YOUR APPOINTMENT  Due to the recent COVID-19 pandemic, we are transitioning in-person office visits to tele-medicine visits in an effort to decrease unnecessary exposure to our patients, their families, and staff. These visits are billed to your insurance just like a normal visit is. We also encourage you to sign up for MyChart if you have not already done so. You will need a smartphone if possible. For patients that do not have this, we can still complete the visit using a regular telephone but do prefer a smartphone to enable video when possible. You may have a family member that lives with you that can help. If possible, we also ask that you have a blood pressure cuff and scale at home to measure your blood pressure, heart rate and weight prior to your scheduled appointment. Patients with clinical needs that need an in-person evaluation and testing will still be able to come to the office if absolutely necessary. If you have any questions, feel free to call our office.     YOUR PROVIDER WILL BE USING ONE OF THE FOLLOWING PLATFORMS TO COMPLETE YOUR VISIT:    IF USING MYCHART - How to Download the MyChart App to Your SmartPhone   - If Apple, go to [employer] and type in MyChart in the search bar and download the app. If Android, ask patient to go to [employer] and type in [place] in the search bar and download the app. The app is free but as with any other app downloads, your phone may require you to verify saved payment information or Apple/Android password.  - You will need to then log into the app with your MyChart username and password, and select [facility] as your healthcare provider to link the account.  - When it is time for your visit, go to the MyChart app, find appointments, and click Begin Video Visit. Be sure to Select Allow for your device to  access the Microphone and Camera for your visit. You will then be connected, and your provider will be with you shortly.  **If you have any issues connecting or need assistance, please contact MyChart service desk [phone])**  **If using a computer, in order to ensure the best quality for your visit, you will need to use either of the following Internet Browsers: Insurance underwriter or Microsoft Edge**   IF USING DOXIMITY or [email or web address] - The staff will give you instructions on receiving your link to join the meeting the day of your visit.      2-3 DAYS BEFORE YOUR APPOINTMENT  You will receive a telephone call from one of our [place] team members - your caller ID may say "Unknown caller." If this is a video visit, we will walk you through how to get the video launched on your phone. We will remind you check your blood pressure, heart rate and weight prior to your scheduled appointment. If you have an Apple Watch or Kardia, please upload any pertinent ECG strips the day before or morning of your appointment to [facility]. Our staff will also make sure you have reviewed the consent and agree to move forward with your scheduled tele-health visit.     THE DAY OF YOUR APPOINTMENT  Approximately 15 minutes prior to your scheduled appointment, you will receive a telephone call from one of [facility] team - your caller ID may say "Unknown  caller."  Our staff will confirm medications, vital signs for the day and any symptoms you may be experiencing. Please have this information available prior to the time of visit start. It may also be helpful for you to have a pad of paper and pen handy for any instructions given during your visit. They will also walk you through joining the smartphone meeting if this is a video visit. ° ° ° °CONSENT FOR TELE-HEALTH VISIT - PLEASE REVIEW ° °I hereby voluntarily request, consent and authorize [facility] and its employed or contracted physicians, physician  assistants, nurse practitioners or other licensed health care professionals (the Practitioner), to provide me with telemedicine health care services (the “Services") as deemed necessary by the treating Practitioner. I acknowledge and consent to receive the Services by the Practitioner via telemedicine. I understand that the telemedicine visit will involve communicating with the Practitioner through live audiovisual communication technology and the disclosure of certain medical information by electronic transmission. I acknowledge that I have been given the opportunity to request an in-person assessment or other available alternative prior to the telemedicine visit and am voluntarily participating in the telemedicine visit. ° °I understand that I have the right to withhold or withdraw my consent to the use of telemedicine in the course of my care at any time, without affecting my right to future care or treatment, and that the Practitioner or I may terminate the telemedicine visit at any time. I understand that I have the right to inspect all information obtained and/or recorded in the course of the telemedicine visit and may receive copies of available information for a reasonable fee.  I understand that some of the potential risks of receiving the Services via telemedicine include:  °• Delay or interruption in medical evaluation due to technological equipment failure or disruption; °• Information transmitted may not be sufficient (e.g. poor resolution of images) to allow for appropriate medical decision making by the Practitioner; and/or  °• In rare instances, security protocols could fail, causing a breach of personal health information. ° °Furthermore, I acknowledge that it is my responsibility to provide information about my medical history, conditions and care that is complete and accurate to the best of my ability. I acknowledge that Practitioner's advice, recommendations, and/or decision may be based on  factors not within their control, such as incomplete or inaccurate data provided by me or distortions of diagnostic images or specimens that may result from electronic transmissions. I understand that the practice of medicine is not an exact science and that Practitioner makes no warranties or guarantees regarding treatment outcomes. I acknowledge that I will receive a copy of this consent concurrently upon execution via email to the email address I last provided but may also request a printed copy by calling the office of [facility].   ° °I understand that my insurance will be billed for this visit.  ° °I have read or had this consent read to me. °• I understand the contents of this consent, which adequately explains the benefits and risks of the Services being provided via telemedicine.  °• I have been provided ample opportunity to ask questions regarding this consent and the Services and have had my questions answered to my satisfaction. °• I give my informed consent for the services to be provided through the use of telemedicine in my medical care ° °By participating in this telemedicine visit I agree to the above. ° °

## 2019-01-25 NOTE — Progress Notes (Signed)
Patient alert and oriented x 4, denies pain with assess. Report received from [facility] in the emergency department, informed patient offered Covid testing several times and educated on risk of refusal, however, patient continued to refuse.  Patient arrived to unit and place on airborne precautions on Covid monitoring unit, educated on with monitoring parameters, patient demanding Covid testing.  He states "I didn't know this is what to expect page the doctor now".  Hospitalist paged, test ordered and pending.

## 2019-01-30 ENCOUNTER — Encounter (HOSPITAL_COMMUNITY): Payer: Self-pay

## 2019-01-30 ENCOUNTER — Emergency Department (HOSPITAL_COMMUNITY)
Admission: EM | Admit: 2019-01-30 | Discharge: 2019-01-30 | Disposition: A | Discharge status: Home / Self Care | Payer: Medicaid Other | Attending: Emergency Medicine | Admitting: Emergency Medicine

## 2019-01-30 ENCOUNTER — Other Ambulatory Visit: Payer: Self-pay

## 2019-01-30 ENCOUNTER — Emergency Department (HOSPITAL_COMMUNITY): Payer: Medicaid Other

## 2019-01-30 DIAGNOSIS — Z880 Allergy status to penicillin: Secondary | ICD-10-CM | POA: Diagnosis not present

## 2019-01-30 DIAGNOSIS — I130 Hypertensive heart and chronic kidney disease with heart failure and stage 1 through stage 4 chronic kidney disease, or unspecified chronic kidney disease: Secondary | ICD-10-CM | POA: Insufficient documentation

## 2019-01-30 DIAGNOSIS — Z8673 Personal history of transient ischemic attack (TIA), and cerebral infarction without residual deficits: Secondary | ICD-10-CM | POA: Diagnosis not present

## 2019-01-30 DIAGNOSIS — K0889 Other specified disorders of teeth and supporting structures: Secondary | ICD-10-CM | POA: Diagnosis present

## 2019-01-30 DIAGNOSIS — Z79899 Other long term (current) drug therapy: Secondary | ICD-10-CM | POA: Diagnosis not present

## 2019-01-30 DIAGNOSIS — N182 Chronic kidney disease, stage 2 (mild): Secondary | ICD-10-CM | POA: Insufficient documentation

## 2019-01-30 DIAGNOSIS — K029 Dental caries, unspecified: Secondary | ICD-10-CM | POA: Diagnosis not present

## 2019-01-30 DIAGNOSIS — R600 Localized edema: Secondary | ICD-10-CM | POA: Insufficient documentation

## 2019-01-30 DIAGNOSIS — Z7982 Long term (current) use of aspirin: Secondary | ICD-10-CM | POA: Diagnosis not present

## 2019-01-30 DIAGNOSIS — Z87891 Personal history of nicotine dependence: Secondary | ICD-10-CM | POA: Diagnosis not present

## 2019-01-30 DIAGNOSIS — K047 Periapical abscess without sinus: Secondary | ICD-10-CM | POA: Insufficient documentation

## 2019-01-30 DIAGNOSIS — I5022 Chronic systolic (congestive) heart failure: Secondary | ICD-10-CM | POA: Diagnosis not present

## 2019-01-30 LAB — CBC WITH DIFFERENTIAL/PLATELET
Abs Immature Granulocytes: 0.02 10*3/uL (ref 0.00–0.07)
Basophils Absolute: 0 10*3/uL (ref 0.0–0.1)
Basophils Relative: 0 %
Eosinophils Absolute: 0 10*3/uL (ref 0.0–0.5)
Eosinophils Relative: 1 %
HCT: 39.1 % (ref 39.0–52.0)
Hemoglobin: 12.7 g/dL — ABNORMAL LOW (ref 13.0–17.0)
Immature Granulocytes: 0 %
Lymphocytes Relative: 33 %
Lymphs Abs: 2 10*3/uL (ref 0.7–4.0)
MCH: 25.8 pg — ABNORMAL LOW (ref 26.0–34.0)
MCHC: 32.5 g/dL (ref 30.0–36.0)
MCV: 79.5 fL — ABNORMAL LOW (ref 80.0–100.0)
Monocytes Absolute: 0.7 10*3/uL (ref 0.1–1.0)
Monocytes Relative: 12 %
Neutro Abs: 3.3 10*3/uL (ref 1.7–7.7)
Neutrophils Relative %: 54 %
Platelets: 204 10*3/uL (ref 150–400)
RBC: 4.92 MIL/uL (ref 4.22–5.81)
RDW: 18.2 % — ABNORMAL HIGH (ref 11.5–15.5)
WBC: 6.1 10*3/uL (ref 4.0–10.5)
nRBC: 0 % (ref 0.0–0.2)

## 2019-01-30 LAB — BASIC METABOLIC PANEL
Anion gap: 10 (ref 5–15)
BUN: 24 mg/dL — ABNORMAL HIGH (ref 6–20)
CO2: 23 mmol/L (ref 22–32)
Calcium: 9.1 mg/dL (ref 8.9–10.3)
Chloride: 99 mmol/L (ref 98–111)
Creatinine, Ser: 1.1 mg/dL (ref 0.61–1.24)
GFR calc Af Amer: >60 mL/min (ref >60)
GFR calc non Af Amer: >60 mL/min (ref >60)
Glucose, Bld: 103 mg/dL — ABNORMAL HIGH (ref 70–99)
Potassium: 4.3 mmol/L (ref 3.5–5.1)
Sodium: 132 mmol/L — ABNORMAL LOW (ref 135–145)

## 2019-01-30 MED ORDER — ALBUTEROL SULFATE HFA 108 (90 BASE) MCG/ACT IN AERS
2.0000 | INHALATION_SPRAY | Freq: Once | RESPIRATORY_TRACT | Status: AC
  Administered 2019-01-30: 2 via RESPIRATORY_TRACT
  Filled 2019-01-30: qty 6.7

## 2019-01-30 MED ORDER — KETOROLAC TROMETHAMINE 15 MG/ML IJ SOLN
15.0000 mg | Freq: Once | INTRAMUSCULAR | Status: AC
  Administered 2019-01-30: 15 mg via INTRAVENOUS
  Filled 2019-01-30: qty 1

## 2019-01-30 MED ORDER — CLINDAMYCIN HCL 150 MG PO CAPS: 300.0000 mg | ORAL_CAPSULE | Freq: Three times a day (TID) | ORAL | 0 refills | Status: DC

## 2019-01-30 MED ORDER — CLINDAMYCIN HCL 150 MG PO CAPS: 300.0000 mg | ORAL_CAPSULE | Freq: Three times a day (TID) | ORAL | 0 refills | Status: AC

## 2019-01-30 MED ORDER — CLINDAMYCIN PHOSPHATE 600 MG/50ML IV SOLN
600.0000 mg | Freq: Once | INTRAVENOUS | Status: AC
  Administered 2019-01-30: 600 mg via INTRAVENOUS
  Filled 2019-01-30: qty 50

## 2019-01-30 NOTE — Discharge Instructions (Addendum)
You need to follow-up with a dentist in regards to your dental pain and drainage.  Take the antibiotics to reduce infection.  There were no signs of acute congestive heart failure today.  Please return to the emergency department if you have new or worsening symptoms.

## 2019-01-30 NOTE — ED Provider Notes (Signed)
[name] Provider Note   CSN: [id number] Arrival date & time: 01/30/19  1902    History   Chief Complaint Chief Complaint  Patient presents with  . Shortness of Breath  . Dental Pain    HPI Mark [name] is a 43 y.o. male.     Patient is a 43 year old gentleman with past medical history of polysubstance abuse, CKD, CHF who presents the emergency department for dental pain.  Patient reports that he has had dental caries for a long time.  He was given IV antibiotics and oral antibiotics after a recent admission for CHF.  Reports that he has not taken his prescription that he was given for his clindamycin because "I did not want to".  Patient has not seen a dentist either.  Reports that he is having drainage from his tooth into his mouth.  Reports that the drainage taste bad and smells funny.  Reports that it causes nasal congestion and goes down his throat and makes his CHF worse.  Denies any fever, chills, chest pain, trouble breathing.  He has not tried anything for relief.  Triage note states that patient's chief complaint is shortness of breath.  However, patient states that his chief complaint is his dental pain and drainage which is draining down his throat and causing his "shortness of breath" to be worse than his baseline.  Denies any increase in leg swelling.     Past Medical History:  Diagnosis Date  . Asthma   . Chronic systolic CHF (congestive heart failure) (HCC)   . Cigarette smoker   . CKD (chronic kidney disease), stage II    [name]/notes 10/01/2017  . History of echocardiogram    a. Echo 5/17 - EF 20-25%, severe diff HK, restrictive physiology, mild to mod MR, severe reduced RVSF, mod RVE, mild RAE, mod TR, PASP 48 mmHg  . Hx of cardiac cath    a. LHC 5/17 - normal coronary arteries. PA 45/25, mean 33, PCWP mean 18  . NICM (nonischemic cardiomyopathy) (HCC)   . Stroke (HCC) 09/27/2017   "was weak on my left side; I'm fully  recovered" (11/09/2017)  . Substance abuse (HCC)    cocaine, marijuana    Patient Active Problem List   Diagnosis Date Noted  . Acute CHF (congestive heart failure) (HCC) 01/24/2019  . SOB (shortness of breath)   . Cocaine use   . Acute on chronic systolic congestive heart failure (HCC) 12/25/2018  . Demand ischemia (HCC)   . LV (left ventricular) mural thrombus without MI   . Noncompliance   . Chronic anemia   . Acute systolic CHF (congestive heart failure) (HCC) 11/09/2017  . History of right MCA stroke 09/28/2017  . Stroke (HCC) 09/28/2017  . Chronic systolic heart failure (HCC) 01/11/2016  . NICM (nonischemic cardiomyopathy) (HCC) 01/11/2016  . CKD (chronic kidney disease) stage 2, GFR 60-89 ml/min 01/11/2016  . Cocaine abuse (HCC) 01/11/2016  . Chest pain, pleuritic 01/03/2016  . Tobacco abuse 01/03/2016  . Asthma 01/03/2016  . Leg swelling 01/03/2016  . Tachycardia 01/03/2016  . Normocytic anemia 01/03/2016  . Elevated troponin I level 01/03/2016  . Right rib fracture 01/03/2016  . Chest pain     Past Surgical History:  Procedure Laterality Date  . CARDIAC CATHETERIZATION N/A 01/05/2016   Procedure: Right/Left Heart Cath and Coronary Angiography;  Surgeon: Lennette Biharihomas A Ashauna Bertholf, MD;  [facility];  Service: Cardiovascular;  Laterality: N/A;  Home Medications    Prior to Admission medications   Medication Sig Start Date End Date Taking? Authorizing Provider  albuterol (VENTOLIN HFA) 108 (90 Base) MCG/ACT inhaler Inhale 1-2 puffs into the lungs every 4 (four) hours as needed for wheezing or shortness of breath. 12/30/18  Yes Sheikh, Omair Latif, DO  aspirin 81 MG EC tablet Take 1 tablet (81 mg total) by mouth daily. Patient taking differently: Take 162 mg by mouth daily.  12/30/18  Yes Sheikh, Omair Latif, DO  budesonide-formoterol (SYMBICORT) 80-4.5 MCG/ACT inhaler Inhale 2 puffs into the lungs 2 (two) times daily. 10/12/17  Yes Dorena Dew, FNP   digoxin (LANOXIN) 0.125 MG tablet Take 1 tablet (0.125 mg total) by mouth daily. 12/30/18  Yes Sheikh, Omair Latif, DO  furosemide (LASIX) 80 MG tablet Take 1 tablet (80 mg total) by mouth daily. 12/31/18  Yes Sheikh, Omair Latif, DO  losartan (COZAAR) 25 MG tablet Take 1 tablet (25 mg total) by mouth at bedtime. DISCUSS WITH CARDIOLOGY ABOUT WHEN TO START Patient taking differently: Take 25 mg by mouth daily. DISCUSS WITH CARDIOLOGY ABOUT WHEN TO START 12/30/18 03/30/19 Yes Sheikh, Georgina Quint Latif, DO  Multiple Vitamin (MULTIVITAMIN WITH MINERALS) TABS tablet Take 1 tablet by mouth daily.   Yes [provider]  spironolactone (ALDACTONE) 25 MG tablet Take 0.5 tablets (12.5 mg total) by mouth daily. 01/26/19  Yes Georgette Shell, MD  clindamycin (CLEOCIN) 150 MG capsule Take 2 capsules (300 mg total) by mouth 3 (three) times daily for 7 days. 01/30/19 02/06/19  Alveria Apley, PA-C  nicotine (NICODERM CQ - DOSED IN MG/24 HOURS) 14 mg/24hr patch Place 1 patch (14 mg total) onto the skin daily. 12/31/18   Kerney Elbe, DO    Family History Family History  Problem Relation Age of Onset  . Cardiomyopathy Father        Reports his father has an LVAD  . Heart failure Father   . Hypertension Father   . Deep vein thrombosis Neg Hx     Social History Social History   Tobacco Use  . Smoking status: Former Smoker    Packs/day: 1.00    Years: 30.00    Pack years: 30.00    Types: Cigarettes    Last attempt to quit: 09/27/2017    Years since quitting: 1.3  . Smokeless tobacco: Never Used  Substance Use Topics  . Alcohol use: Yes    Alcohol/week: 5.0 standard drinks    Types: 5 Shots of liquor per week    Frequency: Never    Comment: 5-6 shots of vodka daily; "stopped it all after I had stroke 09/27/2017"  . Drug use: Yes    Types: Cocaine, Marijuana    Comment: 11/09/2017 "none since 09/27/2017"     Allergies   Hydrocodone; Lisinopril; Prednisone; and Penicillins   Review of  Systems Review of Systems  Constitutional: Negative for appetite change, chills and fever.  HENT: Positive for dental problem. Negative for congestion, rhinorrhea, sinus pressure and sinus pain.   Respiratory: Positive for shortness of breath. Negative for cough.   Cardiovascular: Negative for chest pain, palpitations and leg swelling.  Gastrointestinal: Negative for abdominal pain, diarrhea, nausea and vomiting.  Genitourinary: Negative for dysuria.  Musculoskeletal: Negative for back pain.  Skin: Negative for rash.  Neurological: Negative for dizziness, light-headedness, numbness and headaches.     Physical Exam Updated Vital Signs BP 112/84   Pulse 90   Temp 97.6 F (36.4 C) (Oral)  Resp 18   Ht 5\' 11"  (1.803 m)   Wt 71.1 kg   SpO2 99%   BMI 21.86 kg/m   Physical Exam Vitals signs and nursing note reviewed.  Constitutional:      General: He is not in acute distress.    Appearance: Normal appearance. He is well-developed. He is not ill-appearing, toxic-appearing or diaphoretic.  HENT:     Head: Normocephalic. No raccoon eyes, Battle's sign, abrasion or laceration.     Jaw: There is normal jaw occlusion.     Comments: There is no facial swelling or edema.    Nose: Nose normal.     Mouth/Throat:     Mouth: Mucous membranes are moist.     Pharynx: Oropharynx is clear.      Comments: Multiple dental caries. Eyes:     Conjunctiva/sclera: Conjunctivae normal.  Cardiovascular:     Rate and Rhythm: Normal rate and regular rhythm.  Pulmonary:     Effort: Pulmonary effort is normal.     Breath sounds: Normal breath sounds. No rales.  Musculoskeletal:     Right lower leg: Edema (1+) present.     Left lower leg: Edema (1+) present.  Skin:    General: Skin is dry.  Neurological:     General: No focal deficit present.     Mental Status: He is alert.  Psychiatric:        Mood and Affect: Mood normal.      ED Treatments / Results  Labs (all labs ordered are listed,  but only abnormal results are displayed) Labs Reviewed  CBC WITH DIFFERENTIAL/PLATELET - Abnormal; Notable for the following components:      Result Value   Hemoglobin 12.7 (*)    MCV 79.5 (*)    MCH 25.8 (*)    RDW 18.2 (*)    All other components within normal limits  BASIC METABOLIC PANEL - Abnormal; Notable for the following components:   Sodium 132 (*)    Glucose, Bld 103 (*)    BUN 24 (*)    All other components within normal limits    EKG EKG Interpretation  Date/Time:  Wednesday January 30 2019 19:09:14 EDT Ventricular Rate:  117 PR Interval:  146 QRS Duration: 100 QT Interval:  320 QTC Calculation: 446 R Axis:   -133 Text Interpretation:  Sinus tachycardia Septal infarct , age undetermined Lateral infarct , age undetermined Abnormal ECG No STEMI. Similar to prior.  Confirmed by Alona Bene [phone]) on 01/30/2019 8:07:29 PM   Radiology Dg Chest 2 View  Result Date: 01/30/2019 CLINICAL DATA:  Shortness of breath.  Dental pain. EXAM: CHEST - 2 VIEW COMPARISON:  January 24, 2019 FINDINGS: Stable cardiomegaly. The hila, mediastinum, lungs, and pleura are otherwise unremarkable. IMPRESSION: No active cardiopulmonary disease. Electronically Signed   By: Gerome Sam III M.D   On: 01/30/2019 19:40    Procedures Procedures (including critical care time)  Medications Ordered in ED Medications  albuterol (VENTOLIN HFA) 108 (90 Base) MCG/ACT inhaler 2 puff (2 puffs Inhalation Given 01/30/19 1921)  clindamycin (CLEOCIN) IVPB 600 mg (600 mg Intravenous New Bag/Given 01/30/19 2117)  ketorolac (TORADOL) 15 MG/ML injection 15 mg (15 mg Intravenous Given 01/30/19 2117)     Initial Impression / Assessment and Plan / ED Course  I have reviewed the triage vital signs and the nursing notes.  Pertinent labs & imaging results that were available during my care of the patient were reviewed by me and considered in my medical  decision making (see chart for details).  Clinical Course as of  Jan 30 2223  Wed Jan 30, 2019  2153 Patient's main complaint today is his dental pain and drainage.  Has had for several months and encouraged dental follow-up and outpatient antibiotics which patient was noncompliant with.  No signs of any facial swelling or airway compromise.  There is no CHF exacerbation today either.  Patient was again advised that he needs to follow-up with a dentist and take outpatient oral antibiotics.  Patient was given a dose of IV antibiotics here in the emergency department.  Normal white count and normal vitals.   [KM]    Clinical Course User Index [KM] Arlyn DunningMcLean, Seiya Silsby A, PA-C         Final Clinical Impressions(s) / ED Diagnoses   Final diagnoses:  Pain due to dental caries  Dental infection    ED Discharge Orders         Ordered    clindamycin (CLEOCIN) 150 MG capsule  3 times daily     01/30/19 2200           Jeral PinchMcLean, Sindhu Nguyen A, PA-C 01/30/19 2224    Derwood KaplanNanavati, Ankit, MD 01/31/19 1807

## 2019-01-30 NOTE — ED Notes (Signed)
Patient verbalizes understanding of discharge instructions. Opportunity for questioning and answers were provided. Armband removed by staff, pt discharged from ED.  

## 2019-01-30 NOTE — ED Triage Notes (Signed)
Pt from home with ems for c.o increased SOB and dental pain, pt seen here last week for dental pain but pt has not started taking abx yet. Pt wheezing. Swelling in abd/groin

## 2019-01-31 ENCOUNTER — Ambulatory Visit: Payer: Self-pay | Admitting: Primary Care

## 2019-01-31 MED FILL — SPIRONOLACTONE 25 MG TABLET: 25 | 30 days supply | Qty: 15 | Fill #0

## 2019-01-31 MED FILL — DIGOXIN 0.125 MG TABLET: 125 | 30 days supply | Qty: 30 | Fill #1

## 2019-01-31 MED FILL — LOSARTAN POTASSIUM 25 MG TA: 25 | 30 days supply | Qty: 30 | Fill #1

## 2019-01-31 MED FILL — CLINDAMYCIN HCL 150 MG CAPS: 150 | 7 days supply | Qty: 42 | Fill #0

## 2019-01-31 MED FILL — !PROVENTIL HFA 90 MCG INH: 108 (90 BAS | 16 days supply | Qty: 1 | Fill #1

## 2019-02-04 ENCOUNTER — Other Ambulatory Visit: Payer: Self-pay

## 2019-02-04 ENCOUNTER — Ambulatory Visit (HOSPITAL_COMMUNITY)
Admission: RE | Admit: 2019-02-04 | Discharge: 2019-02-04 | Disposition: A | Discharge status: Home / Self Care | Payer: Self-pay | Source: Ambulatory Visit | Attending: Internal Medicine | Admitting: Internal Medicine

## 2019-02-05 ENCOUNTER — Other Ambulatory Visit: Payer: Self-pay

## 2019-02-05 ENCOUNTER — Ambulatory Visit: Payer: Self-pay | Attending: Primary Care | Admitting: Primary Care

## 2019-02-05 ENCOUNTER — Telehealth: Payer: Self-pay

## 2019-02-05 DIAGNOSIS — J4520 Mild intermittent asthma, uncomplicated: Secondary | ICD-10-CM

## 2019-02-05 DIAGNOSIS — K047 Periapical abscess without sinus: Secondary | ICD-10-CM

## 2019-02-05 DIAGNOSIS — J181 Lobar pneumonia, unspecified organism: Secondary | ICD-10-CM

## 2019-02-05 DIAGNOSIS — I5022 Chronic systolic (congestive) heart failure: Secondary | ICD-10-CM

## 2019-02-05 DIAGNOSIS — Z720 Tobacco use: Secondary | ICD-10-CM

## 2019-02-05 DIAGNOSIS — Z91199 Patient's noncompliance with other medical treatment and regimen due to unspecified reason: Secondary | ICD-10-CM

## 2019-02-05 DIAGNOSIS — Z9119 Patient's noncompliance with other medical treatment and regimen: Secondary | ICD-10-CM

## 2019-02-05 DIAGNOSIS — J189 Pneumonia, unspecified organism: Secondary | ICD-10-CM

## 2019-02-05 DIAGNOSIS — J449 Chronic obstructive pulmonary disease, unspecified: Secondary | ICD-10-CM

## 2019-02-05 DIAGNOSIS — Z7689 Persons encountering health services in other specified circumstances: Secondary | ICD-10-CM

## 2019-02-05 NOTE — Progress Notes (Addendum)
Virtual Visit via Telephone Note  I connected with Marcellus Scott on 02/05/19 at  2:10 PM EDT by telephone and verified that I am speaking with the correct person using two identifiers.   I discussed the limitations, risks, security and privacy concerns of performing an evaluation and management service by telephone and the availability of in person appointments. I also discussed with the patient that there may be a patient responsible charge related to this service. The patient expressed understanding and agreed to proceed.   History of Present Illness: Mr. Chalmers is establishing care he has had a stroke with right side weakness, bed to chair transfer with assistance and only able to take a few steps with assistance. 01/24/19 diagnosed with Acute on chronic CHF. Mother and father are passed and staying with a friend. Unable to do ADL's. This was concerning to me ask Clinical Case manger to speak with patient. Mr. Zilliox explained that he has not been able to get to his medical appointments or have assess to pick up medications due to immobility and transportation.He is not able to qualify for Medicaid and has been denied several times.  Patient is initial concerning complaint was a infected gum or tooth abscess treated in the emergency room and had allergic reaction medication stopped.  Observations/Objective: Review of Systems  Constitutional: Negative.   HENT: Negative.        Tooth abcess   Eyes: Negative.   Respiratory: Positive for cough and shortness of breath.   Cardiovascular: Negative.   Gastrointestinal: Negative.   Genitourinary: Negative.   Musculoskeletal: Negative.   Skin: Negative.   Neurological: Positive for weakness.  Psychiatric/Behavioral: Positive for depression.    Assessment and Plan:  Koehn was seen today for hospitalization follow-up.  Diagnoses and all orders for this visit:  Chronic systolic heart failure [facility]) He is followed by Dr. [name] at the heart  failure clinic. Expressed to him it is imperative to have virtual visit or in person if requested. He has the opportunity to be followed by well organized clinic with positive results.  All cardiac medications will be deferred to cardiology.  Mild intermittent asthma, unspecified whether complicated Presently on albuterol for rescue inhaler and Symbicort for maintenance due to financial difficulties last prescribed February 2020 with 3 refills that ended last month. Medication has been reordered and [facility] to [facility].  At that time he can inquire about medication assistance.  Tobacco abuse  Admits to continue use of cigarette smoking despite heart failure and asthma.  Previously prescribed NicoDerm patches which he discontinued.  Explained the risk of continuing to smoke, states he has decreased the amount he is smoking daily.  Unable to quantify.  Noncompliance This is his worst battle.  He had been previously been followed by community paramedics program and discharge for noncompliance and not keeping appointments.  This service came out to his home.  Clinical social worker has contacted them to reevaluate and hopefully return/restore services.  Encounter to establish care Establish care for primary care last primary care visit was October 12, 2017.  Since that time he has had numerous encounters at the emergency department to admissions. .   Tooth abscess Treated previously with amoxicillin patient had allergic reaction discontinued antibiotics changed to Septra DS.  He will need to make arrangements to follow-up with a dentist and make payment arrangements if signs and symptoms continue.  Sensitivity to hot and cold temperatures.  Follow Up Instructions:    I  discussed the assessment and treatment plan with the patient. The patient was provided an opportunity to ask questions and all were answered. The patient agreed with the plan and demonstrated an  understanding of the instructions.   The patient was advised to call back or seek an in-person evaluation if the symptoms worsen or if the condition fails to improve as anticipated.  I provided 36 minutes of non-face-to-face time during this encounter.   Kerin Perna, NP

## 2019-02-05 NOTE — Telephone Encounter (Signed)
This CM spoke to patient at request of Juluis Mire, NP. He explained that he has not been able to get to his medical appointments and pick up medications. He said that he has applied for medicaid and disability many times and has been denied.  He was agreeable to placing a referral to [employer] [facility] for guidance  with managing the denials and reapplying.   - the referral was faxed to the attention of Abelino Derrick.   He was also interested in being referred to the [facility]. Informed him that this CM would check with the heart failure team about re-referral and reminded him that he will need to keep his medical appointments including televisits and he said that he understood. Request for community paramedicine sent to [place], [place].  He said that he is not homeless and has been staying with a friend for about 2 -3 years.  The option of [facility] was discussed. He said that that he has not heard of it but would consider.   He complained of dental pain. Marland Kitchen  He needs dental care but has no insurance. He also said that he was prescribed clindamycin at the ED but he is allergic to it. He noted that " it swells me up" and " makes my heart beat fast."  Informed him that his PCP would be notified.

## 2019-02-06 ENCOUNTER — Telehealth (HOSPITAL_COMMUNITY): Payer: Self-pay | Admitting: Licensed Clinical Social Worker

## 2019-02-06 NOTE — Telephone Encounter (Signed)
CSW contacted by CHW to ask if pt could be added back to paramedicine program.  CSW reviewed pt chart and saw that pt was discharged because he did not consistently attend appts or answer calls from paramedicine team.  CSW also saw that pt had no-showed for appts in the past 2 months with the [facility] Clinic.  CSW called pt to discuss.  Pt endorses that he would like to be added back to paramedicine as he is struggling to get his medication at this time.  CSW explained that we would need to have a successful appt in clinic prior to evaluating him to be added back to the paramedicine program as the clinic has not seen him since last June- pt expressed understanding.  CSW awaiting word regarding getting patient set up with another appt.  CSW will continue to follow and assist as needed  Jorge Ny, [place] [facility] Desk#: [phone] Cell#: [phone]

## 2019-02-08 ENCOUNTER — Other Ambulatory Visit (INDEPENDENT_AMBULATORY_CARE_PROVIDER_SITE_OTHER): Payer: Self-pay | Admitting: Primary Care

## 2019-02-08 ENCOUNTER — Telehealth (HOSPITAL_COMMUNITY): Payer: Self-pay | Admitting: Licensed Clinical Social Worker

## 2019-02-08 MED ORDER — SULFAMETHOXAZOLE-TRIMETHOPRIM 800-160 MG PO TABS: 1.0000 | ORAL_TABLET | Freq: Two times a day (BID) | ORAL | 0 refills | Status: DC

## 2019-02-08 NOTE — Telephone Encounter (Signed)
CSW requested new virtual clinic appt for pt- pt set up with appt for Monday at 2pm.  CSW called pt and confirmed he is aware of appt and forsees no barriers to attending.  If patient is able to complete appt I will reach out about potentially adding patient back to paramedicine list.  Jorge Ny, [place] [facility] Desk#: [phone] Cell#: [phone]

## 2019-02-08 NOTE — Telephone Encounter (Signed)
Allergic reaction to pcn will send in Septra DS needs to find a dentist and make payment arragements

## 2019-02-11 ENCOUNTER — Ambulatory Visit (HOSPITAL_COMMUNITY)
Admission: RE | Admit: 2019-02-11 | Discharge: 2019-02-11 | Disposition: A | Discharge status: Home / Self Care | Payer: Self-pay | Source: Ambulatory Visit | Attending: Internal Medicine | Admitting: Internal Medicine

## 2019-02-11 ENCOUNTER — Other Ambulatory Visit: Payer: Self-pay

## 2019-02-11 DIAGNOSIS — I513 Intracardiac thrombosis, not elsewhere classified: Secondary | ICD-10-CM | POA: Insufficient documentation

## 2019-02-11 DIAGNOSIS — F1411 Cocaine abuse, in remission: Secondary | ICD-10-CM

## 2019-02-11 DIAGNOSIS — Z720 Tobacco use: Secondary | ICD-10-CM

## 2019-02-11 DIAGNOSIS — Z8673 Personal history of transient ischemic attack (TIA), and cerebral infarction without residual deficits: Secondary | ICD-10-CM

## 2019-02-11 DIAGNOSIS — I5022 Chronic systolic (congestive) heart failure: Secondary | ICD-10-CM

## 2019-02-11 MED FILL — SULFAMETHOXAZOLE-TMP DS TAB: 800-160 | 7 days supply | Qty: 14 | Fill #0

## 2019-02-11 NOTE — Progress Notes (Signed)
Heart Failure TeleHealth Note  Due to national recommendations of social distancing due to Mark [name], Audio/video telehealth visit is felt to be most appropriate for this patient at this time.  See MyChart message from today for patient consent regarding telehealth for Mark [name].  Date:  02/11/2019   ID:  Mark [name], DOB 1976/04/01, MRN [id number]  Location: Home  Provider location: [facility] Type of Visit: Established patient   PCP:  Kerin Perna, NP  Cardiologist:  Lauree Chandler, MD Primary HF: Dr Haroldine Laws  Chief Complaint: Heart Failure   History of Present Illness Mark [name] is a 43 y.o. male with a history of  significant forchronic systolic CHF (EF 32-95% with severe diffuse hypokinesis 12/2018,NICM by cardiac cath 2017, history of right MCA CVA 2019, asthma, and poly substance use.  In 2017 he was diagnosed with new onset acute systolic heart failure. Cath at that time showed normal cors. He was started on HF meds. Unfortunately he was incarcerated 14 months until late 2018.    Admitted 12/2018 with increased dyspnea. He had run out of medications.  ECHO completed 12/2018 showed severely reduced LV/RVEF 5-10% with large apical thrombus and grade II DD. At that time he refused to go on coumadin.   Admitted to [name] 01/24/2019 with increased dyspnea. UDS was + for cocaine. Diuresed with IV lasix. He had run out of his Hf medications. He was restarted back on HF medications.   He missed the last few appointments in the HF clinic.   He presents via Psychiatric nurse for a telehealth visit today.   Overall feeling fair. Complaining of SOB with exertion. Denies PND/Orthopnea. Appetite ok. No fever or chills.Taking all medications. He has a hard time paying for medications. He has a hard time getting to appointments. Says he has used cocaine in the last few weeks.  He continues to smoke cigarettes.   he denies symptoms worrisome for  COVID [name].   Cardiac Studies  ECHO 12/2018 EF 10-15% severely reduced RV and new apical thrombus.  ECHO 2019 EF 10-15%  Past Medical History:  Diagnosis Date  . Asthma   . Chronic systolic CHF (congestive heart failure) ([name])   . Cigarette smoker   . CKD (chronic kidney disease), stage II    Mark [name] 10/01/2017  . History of echocardiogram    a. Echo 5/17 - EF 20-25%, severe diff HK, restrictive physiology, mild to mod MR, severe reduced RVSF, mod RVE, mild RAE, mod TR, PASP 48 mmHg  . Hx of cardiac cath    a. LHC 5/17 - normal coronary arteries. PA 45/25, mean 33, PCWP mean 18  . NICM (nonischemic cardiomyopathy) ([name])   . Stroke ([name]) 09/27/2017   "was weak on my left side; I'm fully recovered" (11/09/2017)  . Substance abuse ([name])    cocaine, marijuana   Past Surgical History:  Procedure Laterality Date  . CARDIAC CATHETERIZATION N/A 01/05/2016   Procedure: Right/Left Heart Cath and Coronary Angiography;  Surgeon: Troy Sine, MD;  Location: [facility];  Service: Cardiovascular;  Laterality: N/A;     Current Outpatient Medications  Medication Sig Dispense Refill  . albuterol (VENTOLIN HFA) 108 (90 Base) MCG/ACT inhaler Inhale 1-2 puffs into the lungs every 4 (four) hours as needed for wheezing or shortness of breath. 1 Inhaler 4  . aspirin 81 MG EC tablet Take 1 tablet (81 mg total) by mouth daily. (Patient taking differently: Take 162 mg  by mouth daily. ) 30 tablet 4  . atorvastatin (LIPITOR) 40 MG tablet Take 40 mg by mouth daily.    . budesonide-formoterol (SYMBICORT) 80-4.5 MCG/ACT inhaler Inhale 2 puffs into the lungs 2 (two) times daily. 1 Inhaler 3  . digoxin (LANOXIN) 0.125 MG tablet Take 1 tablet (0.125 mg total) by mouth daily. 30 tablet 5  . furosemide (LASIX) 80 MG tablet Take 1 tablet (80 mg total) by mouth daily. 30 tablet 0  . losartan (COZAAR) 25 MG tablet Take 1 tablet (25 mg total) by mouth at bedtime. DISCUSS WITH CARDIOLOGY ABOUT WHEN TO START (Patient  taking differently: Take 25 mg by mouth daily. DISCUSS WITH CARDIOLOGY ABOUT WHEN TO START) 30 tablet 5  . Multiple Vitamin (MULTIVITAMIN WITH MINERALS) TABS tablet Take 1 tablet by mouth daily.    . nicotine (NICODERM CQ - DOSED IN MG/24 HOURS) 14 mg/24hr patch Place 1 patch (14 mg total) onto the skin daily. (Patient not taking: Reported on 02/11/2019) 28 patch 0  . sulfamethoxazole-trimethoprim (BACTRIM DS) 800-160 MG tablet Take 1 tablet by mouth 2 (two) times daily. (Patient not taking: Reported on 02/11/2019) 14 tablet 0   No current facility-administered medications for this encounter.     Allergies:   Hydrocodone, Lisinopril, Prednisone, and Penicillins   Social History:  The patient  reports that he quit smoking about 16 months ago. His smoking use included cigarettes. He has a 30.00 pack-year smoking history. He has never used smokeless tobacco. He reports current alcohol use of about 5.0 standard drinks of alcohol per week. He reports current drug use. Drugs: Cocaine and Marijuana.   Family History:  The patient's family history includes Cardiomyopathy in his father; Heart failure in his father; Hypertension in his father.   ROS:  Please see the history of present illness.   All other systems are personally reviewed and negative.   Exam:  Tele Health Call; Exam is subjectiveGeneral:  Speaks in full sentences. No resp difficulty. Lungs: Normal respiratory effort with conversation.  Abdomen: Non-distended per patient report Extremities: Pt denies edema. Neuro: Alert & oriented x 3.   Recent Labs: 12/30/2018: Magnesium 1.9 01/24/2019: ALT 47; B Natriuretic Peptide 2,792.5 01/30/2019: BUN 24; Creatinine, Ser 1.10; Hemoglobin 12.7; Platelets 204; Potassium 4.3; Sodium 132  Personally reviewed   Wt Readings from Last 3 Encounters:  01/30/19 71.1 kg  01/25/19 71.1 kg  12/30/18 67.9 kg      ASSESSMENT AND PLAN:  1. Chronic Biventricular Heart Failure ECHO 12/2018 EF 10-15% Severely  reduced RV.  NYHA III. Volume status stable. Continue lasix 80 mg daily.  - No bb with ongoing cocaine abuse.  - Continue digoxin 0.125 mg daily.  - Continue losartan 25 mg daily. No entresto with h/o angioedema.     2. Apical Thrombus Identified on ECHO 12/2018. At that time he refused to take coumadin. He is now interested in starting coumadin.   I have discussed the importance of coumadin and he is agreeable. HFSW will set up transportation.  Refer to coumadin clinic for coumadin start.    3. H/O MCA CVA 2019  Continue  atorvastatin 40 mg daily. Continue aspirin 81 mg daily.   4. Cocaine Abuse Discussed cocaine cessation.   5. Social  He can't pay for medications and does not have transportation to appointments. HFSW following. Referred back HF Paramedicine.    COVID screen The patient does not have any symptoms that suggest any further testing/ screening at this time.  Social distancing  reinforced today.  Patient Risk: After full review of this patients clinical status, I feel that they are at moderate risk for cardiac decompensation at this time.  Relevant cardiac medications were reviewed at length with the patient today. The patient does not have concerns regarding their medications at this time.   The following changes were made today: Refer to the coumadin clinic.  Recommended follow-up: Refer to the coumadin clinic. Follow up in 3 weeks for virtual visit with Ronzell Laban   Today, I have spent 25 minutes with the patient with telehealth technology discussing the above issues .    Waneta MartinsSigned, Royer Cristobal, NP  02/11/2019 2:32 PM  [facility] [address] [facility] [place] [id number] [phone] (office) [phone] (fax)

## 2019-02-11 NOTE — Telephone Encounter (Signed)
Patient is aware that he is having allergic reaction to PCN. Septra DS sent to [facility]. He was informed of the need to find a dentist and make payment arrangements. Nat Christen, CMA

## 2019-02-11 NOTE — Addendum Note (Signed)
Encounter addended by: Kerry Dory, CMA on: 02/11/2019 3:34 PM  Actions taken: Order list changed, Diagnosis association updated, Clinical Note Signed

## 2019-02-11 NOTE — Patient Instructions (Signed)
You have been referred to [facility] at [facility] [address] [place], [place] [address] [phone]  -they will contact you for an appointment  Your physician recommends that you schedule a follow-up appointment in: 3 weeks with Darrick Grinder, NP   Do the following things EVERYDAY: 1) Weigh yourself in the morning before breakfast. Write it down and keep it in a log. 2) Take your medicines as prescribed 3) Eat low salt foods-Limit salt (sodium) to 2000 mg per day.  4) Stay as active as you can everyday 5) Limit all fluids for the day to less than 2 liters  At the [facility], you and your health needs are our priority. As part of our continuing mission to provide you with exceptional heart care, we have created designated Provider Care Teams. These Care Teams include your primary Cardiologist (physician) and Advanced Practice Providers (APPs- Physician Assistants and Nurse Practitioners) who all work together to provide you with the care you need, when you need it.   You may see any of the following providers on your designated Care Team at your next follow up: Marland Kitchen Dr Glori Bickers . Dr Loralie Champagne . Darrick Grinder, NP

## 2019-02-12 ENCOUNTER — Telehealth (HOSPITAL_COMMUNITY): Payer: Self-pay | Admitting: Licensed Clinical Social Worker

## 2019-02-12 NOTE — Telephone Encounter (Signed)
CSW consulted to assist with transportation to coumadin appts- CSW spoke with pt and confirmed address and that first appt is set for tomorrow at 10:45am- pt confirmed and confirmed he does not have transportation to get there.  CSW will set up taxi to take pt to coumadin appt tomorrow.  CSW also discussed getting pt back on paramedicine program- pt agreeable.  CSW sent in referral for physician review and signature.  CSW will continue to follow and assist as needed  Jorge Ny, [facility] Desk#: [phone] Cell#: [phone]

## 2019-02-13 ENCOUNTER — Telehealth (HOSPITAL_COMMUNITY): Payer: Self-pay | Admitting: Licensed Clinical Social Worker

## 2019-02-13 ENCOUNTER — Other Ambulatory Visit: Payer: Self-pay

## 2019-02-13 ENCOUNTER — Encounter (HOSPITAL_COMMUNITY): Payer: Self-pay

## 2019-02-13 ENCOUNTER — Ambulatory Visit (INDEPENDENT_AMBULATORY_CARE_PROVIDER_SITE_OTHER): Payer: Self-pay | Admitting: *Deleted

## 2019-02-13 ENCOUNTER — Inpatient Hospital Stay (HOSPITAL_COMMUNITY)
Admission: EM | Admit: 2019-02-13 | Discharge: 2019-02-18 | DRG: 292 | Disposition: A | Discharge status: Home / Self Care | Payer: Medicaid Other | Attending: Internal Medicine | Admitting: Internal Medicine

## 2019-02-13 ENCOUNTER — Emergency Department (HOSPITAL_COMMUNITY): Payer: Medicaid Other

## 2019-02-13 ENCOUNTER — Encounter: Payer: Self-pay | Admitting: Primary Care

## 2019-02-13 DIAGNOSIS — I513 Intracardiac thrombosis, not elsewhere classified: Secondary | ICD-10-CM | POA: Diagnosis present

## 2019-02-13 DIAGNOSIS — K047 Periapical abscess without sinus: Secondary | ICD-10-CM | POA: Diagnosis present

## 2019-02-13 DIAGNOSIS — R945 Abnormal results of liver function studies: Secondary | ICD-10-CM | POA: Diagnosis present

## 2019-02-13 DIAGNOSIS — Z880 Allergy status to penicillin: Secondary | ICD-10-CM

## 2019-02-13 DIAGNOSIS — I639 Cerebral infarction, unspecified: Secondary | ICD-10-CM

## 2019-02-13 DIAGNOSIS — Z5181 Encounter for therapeutic drug level monitoring: Secondary | ICD-10-CM | POA: Insufficient documentation

## 2019-02-13 DIAGNOSIS — R0603 Acute respiratory distress: Secondary | ICD-10-CM | POA: Diagnosis present

## 2019-02-13 DIAGNOSIS — Z9119 Patient's noncompliance with other medical treatment and regimen: Secondary | ICD-10-CM

## 2019-02-13 DIAGNOSIS — Z8673 Personal history of transient ischemic attack (TIA), and cerebral infarction without residual deficits: Secondary | ICD-10-CM

## 2019-02-13 DIAGNOSIS — E785 Hyperlipidemia, unspecified: Secondary | ICD-10-CM | POA: Diagnosis present

## 2019-02-13 DIAGNOSIS — I509 Heart failure, unspecified: Secondary | ICD-10-CM

## 2019-02-13 DIAGNOSIS — K045 Chronic apical periodontitis: Secondary | ICD-10-CM | POA: Diagnosis present

## 2019-02-13 DIAGNOSIS — N182 Chronic kidney disease, stage 2 (mild): Secondary | ICD-10-CM | POA: Diagnosis present

## 2019-02-13 DIAGNOSIS — M264 Malocclusion, unspecified: Secondary | ICD-10-CM | POA: Diagnosis present

## 2019-02-13 DIAGNOSIS — F1490 Cocaine use, unspecified, uncomplicated: Secondary | ICD-10-CM | POA: Diagnosis present

## 2019-02-13 DIAGNOSIS — I428 Other cardiomyopathies: Secondary | ICD-10-CM | POA: Diagnosis present

## 2019-02-13 DIAGNOSIS — I5023 Acute on chronic systolic (congestive) heart failure: Principal | ICD-10-CM | POA: Diagnosis present

## 2019-02-13 DIAGNOSIS — Z8249 Family history of ischemic heart disease and other diseases of the circulatory system: Secondary | ICD-10-CM

## 2019-02-13 DIAGNOSIS — R000 Tachycardia, unspecified: Secondary | ICD-10-CM | POA: Diagnosis present

## 2019-02-13 DIAGNOSIS — I5041 Acute combined systolic (congestive) and diastolic (congestive) heart failure: Secondary | ICD-10-CM

## 2019-02-13 DIAGNOSIS — D649 Anemia, unspecified: Secondary | ICD-10-CM | POA: Diagnosis present

## 2019-02-13 DIAGNOSIS — R740 Nonspecific elevation of levels of transaminase and lactic acid dehydrogenase [LDH]: Secondary | ICD-10-CM | POA: Diagnosis present

## 2019-02-13 DIAGNOSIS — Z515 Encounter for palliative care: Secondary | ICD-10-CM | POA: Insufficient documentation

## 2019-02-13 DIAGNOSIS — K083 Retained dental root: Secondary | ICD-10-CM | POA: Diagnosis present

## 2019-02-13 DIAGNOSIS — R52 Pain, unspecified: Secondary | ICD-10-CM

## 2019-02-13 DIAGNOSIS — Z7189 Other specified counseling: Secondary | ICD-10-CM | POA: Insufficient documentation

## 2019-02-13 DIAGNOSIS — K761 Chronic passive congestion of liver: Secondary | ICD-10-CM | POA: Diagnosis present

## 2019-02-13 DIAGNOSIS — T7840XA Allergy, unspecified, initial encounter: Secondary | ICD-10-CM | POA: Diagnosis present

## 2019-02-13 DIAGNOSIS — Z720 Tobacco use: Secondary | ICD-10-CM

## 2019-02-13 DIAGNOSIS — R0600 Dyspnea, unspecified: Secondary | ICD-10-CM

## 2019-02-13 DIAGNOSIS — I5082 Biventricular heart failure: Secondary | ICD-10-CM | POA: Diagnosis present

## 2019-02-13 DIAGNOSIS — Z20828 Contact with and (suspected) exposure to other viral communicable diseases: Secondary | ICD-10-CM | POA: Diagnosis present

## 2019-02-13 DIAGNOSIS — J449 Chronic obstructive pulmonary disease, unspecified: Secondary | ICD-10-CM | POA: Diagnosis present

## 2019-02-13 DIAGNOSIS — K029 Dental caries, unspecified: Secondary | ICD-10-CM | POA: Diagnosis present

## 2019-02-13 DIAGNOSIS — R0602 Shortness of breath: Secondary | ICD-10-CM

## 2019-02-13 LAB — COMPREHENSIVE METABOLIC PANEL
ALT: 32 U/L (ref 0–44)
AST: 71 U/L — ABNORMAL HIGH (ref 15–41)
Albumin: 3.3 g/dL — ABNORMAL LOW (ref 3.5–5.0)
Alkaline Phosphatase: 128 U/L — ABNORMAL HIGH (ref 38–126)
Anion gap: 13 (ref 5–15)
BUN: 29 mg/dL — ABNORMAL HIGH (ref 6–20)
CO2: 26 mmol/L (ref 22–32)
Calcium: 9.2 mg/dL (ref 8.9–10.3)
Chloride: 98 mmol/L (ref 98–111)
Creatinine, Ser: 1.36 mg/dL — ABNORMAL HIGH (ref 0.61–1.24)
GFR calc Af Amer: >60 mL/min (ref >60)
GFR calc non Af Amer: >60 mL/min (ref >60)
Glucose, Bld: 102 mg/dL — ABNORMAL HIGH (ref 70–99)
Potassium: 3.9 mmol/L (ref 3.5–5.1)
Sodium: 137 mmol/L (ref 135–145)
Total Bilirubin: 2.4 mg/dL — ABNORMAL HIGH (ref 0.3–1.2)
Total Protein: 7.7 g/dL (ref 6.5–8.1)

## 2019-02-13 LAB — CBC WITH DIFFERENTIAL/PLATELET
Abs Immature Granulocytes: 0.01 10*3/uL (ref 0.00–0.07)
Basophils Absolute: 0 10*3/uL (ref 0.0–0.1)
Basophils Relative: 0 %
Eosinophils Absolute: 0 10*3/uL (ref 0.0–0.5)
Eosinophils Relative: 1 %
HCT: 41 % (ref 39.0–52.0)
Hemoglobin: 12.7 g/dL — ABNORMAL LOW (ref 13.0–17.0)
Immature Granulocytes: 0 %
Lymphocytes Relative: 42 %
Lymphs Abs: 2.5 10*3/uL (ref 0.7–4.0)
MCH: 25.9 pg — ABNORMAL LOW (ref 26.0–34.0)
MCHC: 31 g/dL (ref 30.0–36.0)
MCV: 83.5 fL (ref 80.0–100.0)
Monocytes Absolute: 0.5 10*3/uL (ref 0.1–1.0)
Monocytes Relative: 9 %
Neutro Abs: 2.9 10*3/uL (ref 1.7–7.7)
Neutrophils Relative %: 48 %
Platelets: 211 10*3/uL (ref 150–400)
RBC: 4.91 MIL/uL (ref 4.22–5.81)
RDW: 19.2 % — ABNORMAL HIGH (ref 11.5–15.5)
WBC: 6 10*3/uL (ref 4.0–10.5)
nRBC: 0 % (ref 0.0–0.2)

## 2019-02-13 MED ORDER — BUDESONIDE-FORMOTEROL FUMARATE 80-4.5 MCG/ACT IN AERO: 2.0000 | INHALATION_SPRAY | Freq: Two times a day (BID) | RESPIRATORY_TRACT | 3 refills | Status: DC

## 2019-02-13 MED ORDER — WARFARIN SODIUM 5 MG PO TABS: ORAL_TABLET | ORAL | 0 refills | Status: DC

## 2019-02-13 MED ORDER — FAMOTIDINE IN NACL 20-0.9 MG/50ML-% IV SOLN
20.0000 mg | Freq: Once | INTRAVENOUS | Status: AC
  Administered 2019-02-13: 20 mg via INTRAVENOUS
  Filled 2019-02-13: qty 50

## 2019-02-13 MED ORDER — DIPHENHYDRAMINE HCL 50 MG/ML IJ SOLN
25.0000 mg | Freq: Once | INTRAMUSCULAR | Status: AC
  Administered 2019-02-13: 25 mg via INTRAVENOUS
  Filled 2019-02-13: qty 1

## 2019-02-13 MED FILL — !PROVENTIL HFA 90 MCG INH: 108 (90 BAS | 16 days supply | Qty: 1 | Fill #2

## 2019-02-13 MED FILL — WARFARIN SODIUM 5 MG TABLET: 5 | 30 days supply | Qty: 30 | Fill #0

## 2019-02-13 NOTE — Telephone Encounter (Signed)
CSW scheduled taxi for pt at 10:15am to get to coumadin appt this morning.  CSW called pt to confirm he is still able to make it- pt acknowledges what time taxi will be arriving and reports no barriers to him making it to appt.  CSW will continue to follow and assist as needed  Jorge Ny, [facility] Desk#: [phone] Cell#: [phone]

## 2019-02-13 NOTE — Patient Instructions (Addendum)
A full discussion of the nature of anticoagulants has been carried out.  A benefit risk analysis has been presented to the patient, so that they understand the justification for choosing anticoagulation at this time. The need for frequent and regular monitoring, precise dosage adjustment and compliance is stressed.  Side effects of potential bleeding are discussed.  The patient should avoid any OTC items containing aspirin or ibuprofen, and should avoid great swings in general diet.  Avoid alcohol consumption.  Call if any signs of abnormal bleeding.  Must adhere to appointments for compliance and refills.     Description   Start taking 1 tablet (5mg ) daily at 6pm. Please adhere to appointments. Recheck INR in 1 week. [facility] Clinic#[phone] [phone]

## 2019-02-13 NOTE — ED Notes (Signed)
Bed: RESA Expected date:  Expected time:  Means of arrival:  Comments: 19M anaphylaxis

## 2019-02-13 NOTE — Telephone Encounter (Addendum)
CSW informed by patient that he had an allergic reaction (swollen face, itching, nasal discharge)  to a new antibiotic prescribed to him by his PCP.  CSW called pt PCP office and informed provider about pt reaction- provider to call pt and follow up regarding recommendations.  CSW will continue to follow and assist as needed  Jorge Ny, [facility] Desk#: [phone] Cell#: [phone]

## 2019-02-13 NOTE — ED Provider Notes (Signed)
[facility]-EMERGENCY DEPT Provider Note   CSN: [id number] Arrival date & time: 02/13/19  2041    History   Chief Complaint Chief Complaint  Patient presents with  . Allergic Reaction    HPI Mark [name] is a 43 y.o. male.     The history is provided by the patient and medical records. No language interpreter was used.  Allergic Reaction Presenting symptoms: wheezing (per EMS)    Mark [name] is a 43 y.o. male  with a PMH as listed below who presents to the Emergency Department complaining of allergic reaction. Patient states that he started taking Bactrim yesterday. Had some itching when he first took it yesterday. Took again this morning. He was sleeping tonight when he woke from his sleep feeling short of breath as as if his face / eyes were swollen. Denies tongue swelling or lip swelling. No difficulty with swallowing. No n/v/d. Called EMS who reported wheezing in all lung fields. He took several puffs of his home inhaler.  Given 50 mg of Benadryl IV as well as 0.3 of epinephrine IM.  Patient does report feeling better after this.  He still is complaining of some shortness of breath.  He notes history of anaphylactic reactions to medication in the past and this feels similar.    Past Medical History:  Diagnosis Date  . Asthma   . Chronic systolic CHF (congestive heart failure) (HCC)   . Cigarette smoker   . CKD (chronic kidney disease), stage II    [name]/notes 10/01/2017  . History of echocardiogram    a. Echo 5/17 - EF 20-25%, severe diff HK, restrictive physiology, mild to mod MR, severe reduced RVSF, mod RVE, mild RAE, mod TR, PASP 48 mmHg  . Hx of cardiac cath    a. LHC 5/17 - normal coronary arteries. PA 45/25, mean 33, PCWP mean 18  . NICM (nonischemic cardiomyopathy) (HCC)   . Stroke (HCC) 09/27/2017   "was weak on my left side; I'm fully recovered" (11/09/2017)  . Substance abuse (HCC)    cocaine, marijuana    Patient Active Problem List   Diagnosis Date Noted  . Encounter for therapeutic drug monitoring 02/13/2019  . Encounter to establish care 02/13/2019  . 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 Acute CHF (congestive heart failure) (HCC) 01/24/2019  . SOB (shortness of breath)   . Cocaine use   . Acute on chronic systolic congestive heart failure (HCC) 12/25/2018  . Demand ischemia (HCC)   . LV (left ventricular) mural thrombus without MI   . Noncompliance   . Chronic anemia   . Acute systolic CHF (congestive heart failure) (HCC) 11/09/2017  . History of right MCA stroke 09/28/2017  . Stroke (HCC) 09/28/2017  . Chronic systolic heart failure (HCC) 01/11/2016  . NICM (nonischemic cardiomyopathy) (HCC) 01/11/2016  . CKD (chronic kidney disease) stage 2, GFR 60-89 ml/min 01/11/2016  . Cocaine abuse (HCC) 01/11/2016  . Chest pain, pleuritic 01/03/2016  . Tobacco abuse 01/03/2016  . Asthma 01/03/2016  . Leg swelling 01/03/2016  . Tachycardia 01/03/2016  . Normocytic anemia 01/03/2016  . Elevated troponin I level 01/03/2016  . Right rib fracture 01/03/2016  . Chest pain     Past Surgical History:  Procedure Laterality Date  . CARDIAC CATHETERIZATION N/A 01/05/2016   Procedure: Right/Left Heart Cath and Coronary Angiography;  Surgeon: Lennette Biharihomas A Kelly, MD;  [facility];  Service: Cardiovascular;  Laterality: N/A;  Home Medications    Prior to Admission medications   Medication Sig Start Date End Date Taking? Authorizing Provider  albuterol (VENTOLIN HFA) 108 (90 Base) MCG/ACT inhaler Inhale 1-2 puffs into the lungs every 4 (four) hours as needed for wheezing or shortness of breath. 12/30/18  Yes Sheikh, Omair Latif, DO  aspirin 81 MG EC tablet Take 1 tablet (81 mg total) by mouth daily. Patient taking differently: Take 162 mg by mouth daily.  12/30/18  Yes Sheikh, Omair Latif, DO  atorvastatin (LIPITOR) 40 MG tablet Take 40 mg by mouth daily.   Yes [provider]  digoxin (LANOXIN) 0.125 MG tablet Take 1 tablet (0.125 mg total) by mouth daily. 12/30/18  Yes Sheikh, Omair Latif, DO  ferrous sulfate 325 (65 FE) MG EC tablet Take 325 mg by mouth daily.   Yes [provider]  furosemide (LASIX) 80 MG tablet Take 1 tablet (80 mg total) by mouth daily. 12/31/18  Yes Sheikh, Omair Latif, DO  losartan (COZAAR) 25 MG tablet Take 1 tablet (25 mg total) by mouth at bedtime. DISCUSS WITH CARDIOLOGY ABOUT WHEN TO START Patient taking differently: Take 25 mg by mouth daily. DISCUSS WITH CARDIOLOGY ABOUT WHEN TO START 12/30/18 03/30/19 Yes Sheikh, Kateri Mc Latif, DO  Multiple Vitamin (MULTIVITAMIN WITH MINERALS) TABS tablet Take 1 tablet by mouth daily.   Yes [provider]  warfarin (COUMADIN) 5 MG tablet Take 1 tablet daily or as directed by Coumadin Clinic. Patient taking differently: Take 5 mg by mouth daily at 6 PM.  02/13/19  Yes Bensimhon, Bevelyn Buckles, MD  budesonide-formoterol [facility]) 80-4.5 MCG/ACT inhaler Inhale 2 puffs into the lungs 2 (two) times daily. Patient not taking: Reported on 02/13/2019 02/13/19   Grayce Sessions, NP  nicotine (NICODERM CQ - DOSED IN MG/24 HOURS) 14 mg/24hr patch Place 1 patch (14 mg total) onto the skin daily. Patient not taking: Reported on 02/11/2019 12/31/18   Marguerita Merles Latif, DO  sulfamethoxazole-trimethoprim (BACTRIM DS) 800-160 MG tablet Take 1 tablet by mouth 2 (two) times daily. Patient not taking: Reported on 02/11/2019 02/08/19   Grayce Sessions, NP    Family History Family History  Problem Relation Age of Onset  . Cardiomyopathy Father        Reports his father has an LVAD  . Heart failure Father   . Hypertension Father   . Deep vein thrombosis Neg Hx     Social History Social History   Tobacco Use  . Smoking status: Former Smoker    Packs/day: 1.00    Years: 30.00    Pack years: 30.00    Types: Cigarettes    Quit date: 09/27/2017    Years since quitting: 1.3  . Smokeless  tobacco: Never Used  Substance Use Topics  . Alcohol use: Yes    Alcohol/week: 5.0 standard drinks    Types: 5 Shots of liquor per week    Frequency: Never    Comment: 5-6 shots of vodka daily; "stopped it all after I had stroke 09/27/2017"  . Drug use: Yes    Types: Cocaine, Marijuana    Comment: 11/09/2017 "none since 09/27/2017"     Allergies   Hydrocodone, Lisinopril, Prednisone, and Penicillins   Review of Systems Review of Systems  HENT: Positive for congestion and facial swelling.   Respiratory: Positive for shortness of breath and wheezing (per EMS). Negative for cough.   Cardiovascular: Negative for chest pain.  Gastrointestinal: Negative for abdominal pain, diarrhea, nausea and vomiting.  All other systems reviewed and are negative.    Physical Exam Updated Vital Signs BP 109/89   Pulse (!) 111   Temp 97.6 F (36.4 C) (Oral)   Resp (!) 33   SpO2 100%   Physical Exam Vitals signs and nursing note reviewed.  Constitutional:      General: He is not in acute distress.    Appearance: He is well-developed.  HENT:     Head: Normocephalic and atraumatic.     Mouth/Throat:     Comments: Airway patent. No oral swelling or angioedema. Tolerating secretions without difficulty. Neck:     Musculoskeletal: Neck supple.  Cardiovascular:     Heart sounds: Normal heart sounds. No murmur.     Comments: Tachycardic, but regular. Pulmonary:     Effort: No respiratory distress.     Breath sounds: Normal breath sounds.     Comments: Tachypneic.  Lungs clear to ausculation bilaterally. Abdominal:     General: There is no distension.     Palpations: Abdomen is soft.     Tenderness: There is no abdominal tenderness.  Musculoskeletal:     Right lower leg: Edema present.  Skin:    General: Skin is warm and dry.  Neurological:     Mental Status: He is alert and oriented to person, place, and time.      ED Treatments / Results  Labs (all labs ordered are listed, but only  abnormal results are displayed) Labs Reviewed  CBC WITH DIFFERENTIAL/PLATELET - Abnormal; Notable for the following components:      Result Value   Hemoglobin 12.7 (*)    MCH 25.9 (*)    RDW 19.2 (*)    All other components within normal limits  COMPREHENSIVE METABOLIC PANEL - Abnormal; Notable for the following components:   Glucose, Bld 102 (*)    BUN 29 (*)    Creatinine, Ser 1.36 (*)    Albumin 3.3 (*)    AST 71 (*)    Alkaline Phosphatase 128 (*)    Total Bilirubin 2.4 (*)    All other components within normal limits  PROTIME-INR  BRAIN NATRIURETIC PEPTIDE  DIGOXIN LEVEL    EKG EKG Interpretation  Date/Time:  Thursday February 14 2019 00:26:21 EDT Ventricular Rate:  116 PR Interval:    QRS Duration: 113 QT Interval:  328 QTC Calculation: 456 R Axis:   -119 Text Interpretation:  Sinus tachycardia Incomplete RBBB and LAFB Abnormal lateral Q waves Anterior infarct, old Interpretation limited secondary to artifact Confirmed by Glynn Octaveancour, Stephen [phone]([id number]) on 02/14/2019 12:36:47 AM   Radiology Dg Chest 2 View  Result Date: 02/13/2019 CLINICAL DATA:  Allergic reaction. EXAM: CHEST - 2 VIEW COMPARISON:  Chest x-ray dated January 30, 2019 FINDINGS: The heart is significantly enlarged. The lungs are hyperexpanded. There is no pneumothorax. A background of emphysematous changes is suspected bilaterally. There is no acute osseous abnormality. IMPRESSION: No active cardiopulmonary disease. Again noted is significant cardiomegaly. Electronically Signed   By: Katherine Mantlehristopher  Green M.D.   On: 02/13/2019 21:24    Procedures Procedures (including critical care time)  CRITICAL CARE Performed by: Chase PicketJaime Pilcher Indianna Boran  Total critical care time: 35 minutes  Critical care time was exclusive of separately billable procedures and treating other patients.  Critical care was necessary to treat or prevent imminent or life-threatening deterioration.  Critical care was time spent personally by me on the  following activities: development of treatment plan with patient and/or surrogate as well as nursing, discussions with  consultants, evaluation of patient's response to treatment, examination of patient, obtaining history from patient or surrogate, ordering and performing treatments and interventions, ordering and review of laboratory studies, ordering and review of radiographic studies, pulse oximetry and re-evaluation of patient's condition.   Medications Ordered in ED Medications  furosemide (LASIX) injection 80 mg (has no administration in time range)  famotidine (PEPCID) IVPB 20 mg premix (0 mg Intravenous Stopped 02/13/19 2251)  diphenhydrAMINE (BENADRYL) injection 25 mg (25 mg Intravenous Given 02/13/19 2220)     Initial Impression / Assessment and Plan / ED Course  I have reviewed the triage vital signs and the nursing notes.  Pertinent labs & imaging results that were available during my care of the patient were reviewed by me and considered in my medical decision making (see chart for details).       KOLBE DELMONACO is a 43 y.o. male who presents to ED for shortness of breath. Patient reports he just started taking Bactrim and believes he is having allergic reaction. He did get Epi in the field. He felt some improvement with this, but not as much as I would expect. Still reports feeling very short of breath despite benadryl and epi. Apparently was wheezing with EMS, but lungs are clear now. OP clear. No oral swelling. He is quite tachypneic. He does note non-compliance with his Coumadin over the last 2 or 3 days. Hx of medication non-compliance. Will give pepcid/benadryl to see if he has good response, but concerned this may not be allergic reaction.   11:30 PM - Patient re-evaluated. Feels as if his breathing has started to worsen again. Feels very short of breath. Oxygenating at 98-100% O2 on RA but is quite tachypenic. Will broaden work-up to evaluate for other etiologies of dyspnea.  BNP, dig level and pt/inr added. Suspect chf contributing even though CXR does appear clear. Will start on bipap and give dose of Lasix.   Work up pending at shift change. Care assumed by oncoming provider, PA [facility] who will follow up on pending labs, re-evaluate after bipap / lasix and dispo appropriately.     Patient seen by and discussed with Dr. Wyvonnia Dusky who agrees with treatment plan.   Final Clinical Impressions(s) / ED Diagnoses   Final diagnoses:  Shortness of breath    ED Discharge Orders    None       Lennette Fader, Ozella Almond, PA-C 02/14/19 0119    Ezequiel Essex, MD 02/14/19 [phone]

## 2019-02-13 NOTE — ED Triage Notes (Addendum)
Pt presents with allergic reaction that he thinks is to an antibiotic that he recently started for a dental infection. He has had anaphylaxis to several antibiotics in the past. Symptoms started about 1 hour ago. SOB, eye swelling and L sided face swelling noted. Wheezing noted in all fields. Arrived on a NRB. 0.3epi IM and 50 of benadryl IV given en route. 20g in L forearm.

## 2019-02-14 ENCOUNTER — Inpatient Hospital Stay (HOSPITAL_COMMUNITY): Payer: Medicaid Other

## 2019-02-14 ENCOUNTER — Other Ambulatory Visit (HOSPITAL_COMMUNITY): Payer: Self-pay

## 2019-02-14 ENCOUNTER — Encounter (HOSPITAL_COMMUNITY): Payer: Self-pay | Admitting: Internal Medicine

## 2019-02-14 DIAGNOSIS — M264 Malocclusion, unspecified: Secondary | ICD-10-CM | POA: Diagnosis present

## 2019-02-14 DIAGNOSIS — I5082 Biventricular heart failure: Secondary | ICD-10-CM | POA: Diagnosis present

## 2019-02-14 DIAGNOSIS — R0603 Acute respiratory distress: Secondary | ICD-10-CM | POA: Diagnosis present

## 2019-02-14 DIAGNOSIS — E785 Hyperlipidemia, unspecified: Secondary | ICD-10-CM | POA: Diagnosis present

## 2019-02-14 DIAGNOSIS — N182 Chronic kidney disease, stage 2 (mild): Secondary | ICD-10-CM | POA: Diagnosis present

## 2019-02-14 DIAGNOSIS — Z720 Tobacco use: Secondary | ICD-10-CM | POA: Diagnosis not present

## 2019-02-14 DIAGNOSIS — R945 Abnormal results of liver function studies: Secondary | ICD-10-CM | POA: Diagnosis present

## 2019-02-14 DIAGNOSIS — Z880 Allergy status to penicillin: Secondary | ICD-10-CM | POA: Diagnosis not present

## 2019-02-14 DIAGNOSIS — R0602 Shortness of breath: Secondary | ICD-10-CM | POA: Diagnosis present

## 2019-02-14 DIAGNOSIS — R000 Tachycardia, unspecified: Secondary | ICD-10-CM | POA: Diagnosis present

## 2019-02-14 DIAGNOSIS — I5023 Acute on chronic systolic (congestive) heart failure: Secondary | ICD-10-CM | POA: Diagnosis not present

## 2019-02-14 DIAGNOSIS — J449 Chronic obstructive pulmonary disease, unspecified: Secondary | ICD-10-CM | POA: Diagnosis present

## 2019-02-14 DIAGNOSIS — T7840XA Allergy, unspecified, initial encounter: Secondary | ICD-10-CM | POA: Diagnosis present

## 2019-02-14 DIAGNOSIS — Z20828 Contact with and (suspected) exposure to other viral communicable diseases: Secondary | ICD-10-CM | POA: Diagnosis present

## 2019-02-14 DIAGNOSIS — K029 Dental caries, unspecified: Secondary | ICD-10-CM | POA: Diagnosis present

## 2019-02-14 DIAGNOSIS — R740 Nonspecific elevation of levels of transaminase and lactic acid dehydrogenase [LDH]: Secondary | ICD-10-CM | POA: Diagnosis present

## 2019-02-14 DIAGNOSIS — I513 Intracardiac thrombosis, not elsewhere classified: Secondary | ICD-10-CM | POA: Diagnosis present

## 2019-02-14 DIAGNOSIS — K761 Chronic passive congestion of liver: Secondary | ICD-10-CM | POA: Diagnosis present

## 2019-02-14 DIAGNOSIS — K083 Retained dental root: Secondary | ICD-10-CM | POA: Diagnosis present

## 2019-02-14 DIAGNOSIS — I428 Other cardiomyopathies: Secondary | ICD-10-CM | POA: Diagnosis present

## 2019-02-14 DIAGNOSIS — Z8673 Personal history of transient ischemic attack (TIA), and cerebral infarction without residual deficits: Secondary | ICD-10-CM | POA: Diagnosis not present

## 2019-02-14 DIAGNOSIS — Z9119 Patient's noncompliance with other medical treatment and regimen: Secondary | ICD-10-CM | POA: Diagnosis not present

## 2019-02-14 DIAGNOSIS — F1490 Cocaine use, unspecified, uncomplicated: Secondary | ICD-10-CM | POA: Diagnosis present

## 2019-02-14 DIAGNOSIS — K045 Chronic apical periodontitis: Secondary | ICD-10-CM | POA: Diagnosis present

## 2019-02-14 DIAGNOSIS — K047 Periapical abscess without sinus: Secondary | ICD-10-CM | POA: Diagnosis present

## 2019-02-14 DIAGNOSIS — D649 Anemia, unspecified: Secondary | ICD-10-CM | POA: Diagnosis present

## 2019-02-14 DIAGNOSIS — Z8249 Family history of ischemic heart disease and other diseases of the circulatory system: Secondary | ICD-10-CM | POA: Diagnosis not present

## 2019-02-14 LAB — PROTIME-INR
INR: 2 — ABNORMAL HIGH (ref 0.8–1.2)
Prothrombin Time: 22.5 seconds — ABNORMAL HIGH (ref 11.4–15.2)

## 2019-02-14 LAB — COMPREHENSIVE METABOLIC PANEL
ALT: 34 U/L (ref 0–44)
AST: 66 U/L — ABNORMAL HIGH (ref 15–41)
Albumin: 3.4 g/dL — ABNORMAL LOW (ref 3.5–5.0)
Alkaline Phosphatase: 116 U/L (ref 38–126)
Anion gap: 13 (ref 5–15)
BUN: 28 mg/dL — ABNORMAL HIGH (ref 6–20)
CO2: 24 mmol/L (ref 22–32)
Calcium: 9.2 mg/dL (ref 8.9–10.3)
Chloride: 99 mmol/L (ref 98–111)
Creatinine, Ser: 1.17 mg/dL (ref 0.61–1.24)
GFR calc Af Amer: >60 mL/min (ref >60)
GFR calc non Af Amer: >60 mL/min (ref >60)
Glucose, Bld: 100 mg/dL — ABNORMAL HIGH (ref 70–99)
Potassium: 3.6 mmol/L (ref 3.5–5.1)
Sodium: 136 mmol/L (ref 135–145)
Total Bilirubin: 3 mg/dL — ABNORMAL HIGH (ref 0.3–1.2)
Total Protein: 7.6 g/dL (ref 6.5–8.1)

## 2019-02-14 LAB — CBC
HCT: 38.5 % — ABNORMAL LOW (ref 39.0–52.0)
Hemoglobin: 12.2 g/dL — ABNORMAL LOW (ref 13.0–17.0)
MCH: 26.1 pg (ref 26.0–34.0)
MCHC: 31.7 g/dL (ref 30.0–36.0)
MCV: 82.4 fL (ref 80.0–100.0)
Platelets: 160 10*3/uL (ref 150–400)
RBC: 4.67 MIL/uL (ref 4.22–5.81)
RDW: 18.7 % — ABNORMAL HIGH (ref 11.5–15.5)
WBC: 5.7 10*3/uL (ref 4.0–10.5)
nRBC: 0 % (ref 0.0–0.2)

## 2019-02-14 LAB — SARS CORONAVIRUS 2 BY RT PCR (HOSPITAL ORDER, PERFORMED IN ~~LOC~~ HOSPITAL LAB): SARS Coronavirus 2: NEGATIVE

## 2019-02-14 LAB — CREATININE, SERUM
Creatinine, Ser: 1.25 mg/dL — ABNORMAL HIGH (ref 0.61–1.24)
GFR calc Af Amer: >60 mL/min (ref >60)
GFR calc non Af Amer: >60 mL/min (ref >60)

## 2019-02-14 LAB — TROPONIN I (HIGH SENSITIVITY)
Troponin I (High Sensitivity): 50 ng/L — ABNORMAL HIGH (ref <18)
Troponin I (High Sensitivity): 44.2 ng/L — ABNORMAL HIGH (ref <18)
Troponin I (High Sensitivity): 56 ng/L — ABNORMAL HIGH (ref <18)

## 2019-02-14 LAB — BRAIN NATRIURETIC PEPTIDE: B Natriuretic Peptide: 3902.9 pg/mL — ABNORMAL HIGH (ref 0.0–100.0)

## 2019-02-14 LAB — MRSA PCR SCREENING: MRSA by PCR: NEGATIVE

## 2019-02-14 LAB — DIGOXIN LEVEL: Digoxin Level: 0.2 ng/mL — ABNORMAL LOW (ref 0.8–2.0)

## 2019-02-14 MED ORDER — FUROSEMIDE 10 MG/ML IJ SOLN
80.0000 mg | Freq: Two times a day (BID) | INTRAMUSCULAR | Status: DC
  Administered 2019-02-14 – 2019-02-18 (×8): 80 mg via INTRAVENOUS
  Filled 2019-02-14 (×8): qty 8

## 2019-02-14 MED ORDER — HYDRALAZINE HCL 20 MG/ML IJ SOLN: 10.0000 mg | INTRAMUSCULAR | Status: DC | PRN

## 2019-02-14 MED ORDER — ALBUTEROL SULFATE (2.5 MG/3ML) 0.083% IN NEBU: 3.0000 mL | INHALATION_SOLUTION | RESPIRATORY_TRACT | Status: DC | PRN

## 2019-02-14 MED ORDER — MUSCLE RUB 10-15 % EX CREA: 1.0000 "application " | TOPICAL_CREAM | CUTANEOUS | Status: DC | PRN

## 2019-02-14 MED ORDER — SODIUM CHLORIDE 0.9% FLUSH: 3.0000 mL | INTRAVENOUS | Status: DC | PRN

## 2019-02-14 MED ORDER — LOSARTAN POTASSIUM 50 MG PO TABS
25.0000 mg | ORAL_TABLET | Freq: Every day | ORAL | Status: DC
  Administered 2019-02-14 – 2019-02-15 (×2): 25 mg via ORAL
  Filled 2019-02-14 (×2): qty 1

## 2019-02-14 MED ORDER — MOMETASONE FURO-FORMOTEROL FUM 100-5 MCG/ACT IN AERO
2.0000 | INHALATION_SPRAY | Freq: Two times a day (BID) | RESPIRATORY_TRACT | Status: DC
  Administered 2019-02-14 – 2019-02-18 (×9): 2 via RESPIRATORY_TRACT
  Filled 2019-02-14: qty 8.8

## 2019-02-14 MED ORDER — FUROSEMIDE 10 MG/ML IJ SOLN
80.0000 mg | Freq: Once | INTRAMUSCULAR | Status: AC
  Administered 2019-02-14: 80 mg via INTRAVENOUS
  Filled 2019-02-14: qty 8

## 2019-02-14 MED ORDER — DIGOXIN 125 MCG PO TABS
0.1250 mg | ORAL_TABLET | Freq: Every day | ORAL | Status: DC
  Administered 2019-02-14 – 2019-02-18 (×5): 0.125 mg via ORAL
  Filled 2019-02-14 (×5): qty 1

## 2019-02-14 MED ORDER — SALINE SPRAY 0.65 % NA SOLN: 1.0000 | NASAL | Status: DC | PRN

## 2019-02-14 MED ORDER — SENNOSIDES-DOCUSATE SODIUM 8.6-50 MG PO TABS: 2.0000 | ORAL_TABLET | Freq: Every evening | ORAL | Status: DC | PRN

## 2019-02-14 MED ORDER — NICOTINE 14 MG/24HR TD PT24
14.0000 mg | MEDICATED_PATCH | Freq: Every day | TRANSDERMAL | Status: DC
  Administered 2019-02-16 – 2019-02-18 (×3): 14 mg via TRANSDERMAL
  Filled 2019-02-14 (×4): qty 1

## 2019-02-14 MED ORDER — HYDROCORTISONE (PERIANAL) 2.5 % EX CREA: 1.0000 "application " | TOPICAL_CREAM | Freq: Four times a day (QID) | CUTANEOUS | Status: DC | PRN

## 2019-02-14 MED ORDER — FUROSEMIDE 10 MG/ML IJ SOLN
80.0000 mg | Freq: Every day | INTRAMUSCULAR | Status: DC
  Administered 2019-02-14: 80 mg via INTRAVENOUS
  Filled 2019-02-14: qty 8

## 2019-02-14 MED ORDER — POLYETHYLENE GLYCOL 3350 17 G PO PACK: 17.0000 g | PACK | Freq: Every day | ORAL | Status: DC | PRN

## 2019-02-14 MED ORDER — ORAL CARE MOUTH RINSE
15.0000 mL | Freq: Two times a day (BID) | OROMUCOSAL | Status: DC
  Administered 2019-02-15 – 2019-02-18 (×3): 15 mL via OROMUCOSAL

## 2019-02-14 MED ORDER — ASPIRIN EC 81 MG PO TBEC
81.0000 mg | DELAYED_RELEASE_TABLET | Freq: Every day | ORAL | Status: DC
  Administered 2019-02-14 – 2019-02-18 (×5): 81 mg via ORAL
  Filled 2019-02-14 (×6): qty 1

## 2019-02-14 MED ORDER — ALUM & MAG HYDROXIDE-SIMETH 200-200-20 MG/5ML PO SUSP: 30.0000 mL | ORAL | Status: DC | PRN

## 2019-02-14 MED ORDER — WARFARIN - PHARMACIST DOSING INPATIENT: Freq: Every day | Status: DC

## 2019-02-14 MED ORDER — POTASSIUM CHLORIDE CRYS ER 20 MEQ PO TBCR
40.0000 meq | EXTENDED_RELEASE_TABLET | Freq: Once | ORAL | Status: AC
  Administered 2019-02-14: 15:00:00 40 meq via ORAL
  Filled 2019-02-14: qty 2

## 2019-02-14 MED ORDER — ATORVASTATIN CALCIUM 40 MG PO TABS
40.0000 mg | ORAL_TABLET | Freq: Every day | ORAL | Status: DC
  Administered 2019-02-15 – 2019-02-17 (×3): 40 mg via ORAL
  Filled 2019-02-14 (×4): qty 1

## 2019-02-14 MED ORDER — HEPARIN SODIUM (PORCINE) 5000 UNIT/ML IJ SOLN: 5000.0000 [IU] | Freq: Three times a day (TID) | INTRAMUSCULAR | Status: DC

## 2019-02-14 MED ORDER — LORATADINE 10 MG PO TABS: 10.0000 mg | ORAL_TABLET | Freq: Every day | ORAL | Status: DC | PRN

## 2019-02-14 MED ORDER — CHLORHEXIDINE GLUCONATE CLOTH 2 % EX PADS
6.0000 | MEDICATED_PAD | Freq: Every day | CUTANEOUS | Status: DC
  Administered 2019-02-15 – 2019-02-16 (×2): 6 via TOPICAL

## 2019-02-14 MED ORDER — SODIUM CHLORIDE 0.9% FLUSH
3.0000 mL | Freq: Two times a day (BID) | INTRAVENOUS | Status: DC
  Administered 2019-02-14 – 2019-02-18 (×10): 3 mL via INTRAVENOUS

## 2019-02-14 MED ORDER — FERROUS SULFATE 325 (65 FE) MG PO TABS
325.0000 mg | ORAL_TABLET | Freq: Every day | ORAL | Status: DC
  Administered 2019-02-14 – 2019-02-18 (×5): 325 mg via ORAL
  Filled 2019-02-14 (×5): qty 1

## 2019-02-14 MED ORDER — LOSARTAN POTASSIUM 50 MG PO TABS: 25.0000 mg | ORAL_TABLET | Freq: Every day | ORAL | Status: DC

## 2019-02-14 MED ORDER — CHLORHEXIDINE GLUCONATE 0.12 % MT SOLN
15.0000 mL | Freq: Two times a day (BID) | OROMUCOSAL | Status: DC
  Administered 2019-02-14 – 2019-02-17 (×7): 15 mL via OROMUCOSAL
  Filled 2019-02-14 (×8): qty 15

## 2019-02-14 MED ORDER — ADULT MULTIVITAMIN W/MINERALS CH
1.0000 | ORAL_TABLET | Freq: Every day | ORAL | Status: DC
  Administered 2019-02-15 – 2019-02-18 (×4): 1 via ORAL
  Filled 2019-02-14 (×4): qty 1

## 2019-02-14 MED ORDER — HYDROCORTISONE 1 % EX CREA
1.0000 "application " | TOPICAL_CREAM | Freq: Three times a day (TID) | CUTANEOUS | Status: DC | PRN
  Administered 2019-02-15: 1 via TOPICAL
  Filled 2019-02-14: qty 28

## 2019-02-14 MED ORDER — PHENOL 1.4 % MT LIQD: 1.0000 | OROMUCOSAL | Status: DC | PRN

## 2019-02-14 MED ORDER — LIP MEDEX EX OINT: 1.0000 "application " | TOPICAL_OINTMENT | CUTANEOUS | Status: DC | PRN

## 2019-02-14 MED ORDER — SODIUM CHLORIDE 0.9 % IV SOLN: 250.0000 mL | INTRAVENOUS | Status: DC | PRN

## 2019-02-14 MED ORDER — WARFARIN SODIUM 5 MG PO TABS
5.0000 mg | ORAL_TABLET | Freq: Every day | ORAL | Status: DC
  Administered 2019-02-14: 18:00:00 5 mg via ORAL
  Filled 2019-02-14: qty 1

## 2019-02-14 MED ORDER — POLYVINYL ALCOHOL 1.4 % OP SOLN: 1.0000 [drp] | OPHTHALMIC | Status: DC | PRN

## 2019-02-14 MED FILL — SYMBICORT 80-4.5 MCG INH: 80-4.5 | 30 days supply | Qty: 10 | Fill #0

## 2019-02-14 NOTE — ED Notes (Signed)
Lab draw unsuccessful. Phlebotomy contacted for specimen collection. Will continue to monitor patient.

## 2019-02-14 NOTE — Progress Notes (Signed)
ANTICOAGULATION CONSULT NOTE - Initial Consult  Pharmacy Consult for warfarin Indication: left ventricular thrombus  Allergies  Allergen Reactions  . Hydrocodone Hives  . Lisinopril Swelling and Other (See Comments)    Facial swelling/angioedema  . Prednisone Shortness Of Breath, Nausea Only, Swelling and Other (See Comments)    Also made chest feel tight and genital area, legs, and face became swollen badly  . Bactrim [Sulfamethoxazole-Trimethoprim]   . Penicillins Hives and Swelling     Has patient had a PCN reaction causing immediate rash, facial/tongue/throat swelling, SOB or lightheadedness with hypotension: Yes Has patient had a PCN reaction causing severe rash involving mucus membranes or skin necrosis: No Has patient had a PCN reaction that required hospitalization: No Has patient had a PCN reaction occurring within the last 10 years: No If all of the above answers are "NO", then may proceed with Cephalosporin use.        Vital Signs: Temp: 97.6 F (36.4 C) (06/24 2054) Temp Source: Oral (06/24 2054) BP: 139/97 (06/25 0141) Pulse Rate: 110 (06/25 0141)  Labs: Recent Labs    02/13/19 2243  HGB 12.7*  HCT 41.0  PLT 211  CREATININE 1.36*    CrCl cannot be calculated (Unknown ideal weight.).   Medical History: Past Medical History:  Diagnosis Date  . Asthma   . Chronic systolic CHF (congestive heart failure) ([place])   . Cigarette smoker   . CKD (chronic kidney disease), stage II    Archie Endo 10/01/2017  . COPD (chronic obstructive pulmonary disease) ([place]) 10/21/2017   on CT scan chest  . History of echocardiogram    a. Echo 5/17 - EF 20-25%, severe diff HK, restrictive physiology, mild to mod MR, severe reduced RVSF, mod RVE, mild RAE, mod TR, PASP 48 mmHg  . Hx of cardiac cath    a. LHC 5/17 - normal coronary arteries. PA 45/25, mean 33, PCWP mean 18  . NICM (nonischemic cardiomyopathy) ([place])   . Stroke ([place]) 09/27/2017   "was weak on my left side; I'm  fully recovered" (11/09/2017)  . Substance abuse (HCC)    cocaine, marijuana    Medications:  Scheduled:  . heparin  5,000 Units Subcutaneous Q8H  . sodium chloride flush  3 mL Intravenous Q12H   Infusions:  . sodium chloride      Assessment: 66 yoM admitted with alleged allergic rxn to bactrim. On PTA warfarin for left ventricular thrombus. HD 5 mg daily LD 6/24. INR=2  Goal of Therapy:  INR 2-3    Plan:  Daily PT/INR  Dorrene German 02/14/2019,2:29 AM

## 2019-02-14 NOTE — ED Notes (Signed)
ED TO INPATIENT HANDOFF REPORT  Name/Age/Gender Mark [name] 43 y.o. male  Code Status    Code Status Orders  (From admission, onward)         Start     Ordered   02/14/19 0204  Full code  Continuous     02/14/19 0208        Code Status History    Date Active Date Inactive Code Status Order ID Comments User Context   01/25/2019 0142 01/25/2019 1630 Full Code [id number]  Rometta EmeryGarba, Mohammad L, MD Inpatient   12/25/2018 0028 12/30/2018 1922 Full Code [id number]  Charlsie QuestPatel, Vishal R, MD ED   11/09/2017 2159 11/14/2017 2051 Full Code [id number]  Eduard ClosKakrakandy, Arshad N, MD Inpatient   09/28/2017 0747 10/01/2017 2020 Full Code [id number]  Gwenyth BenderBlack, Karen M, NP ED   09/20/2017 2322 09/22/2017 1655 Full Code [id number]  Briscoe Deutscherpyd, Timothy S, MD ED   02/07/2016 1044 02/08/2016 1802 Full Code [id number]  Denton Brickruong, Diana M, MD ED   01/03/2016 0520 01/06/2016 1517 Full Code [id number]  Briscoe Deutscherpyd, Timothy S, MD ED   Advance Care Planning Activity      Home/SNF/Other Home  Chief Complaint allergic reaction  Level of Care/Admitting Diagnosis ED Disposition    ED Disposition Condition Comment   Admit  [facility][id number]  Level of Care: Stepdown [14]  Admit to SDU based on following criteria: Respiratory Distress:  Frequent assessment and/or intervention to maintain adequate ventilation/respiration, pulmonary toilet, and respiratory treatment.  Covid Evaluation: Screening Protocol (No Symptoms)  Diagnosis: Acute CHF (congestive heart failure) [facility](HCC) [[id number]]) [[id number]]  Admitting Physician: Pearson GrippeKIM, JAMES [[id number]]  Attending Physician: Pearson GrippeKIM, JAMES [phone][[id number]]  Estimated length of stay: past midnight tomorrow  Certification:: I certify this patient will need inpatient services for at least 2 midnights  PT Class (Do Not Modify): Inpatient [101]  PT Acc Code (Do Not Modify): Private [1]       Medical History Past Medical History:  Diagnosis Date  . Asthma   . Chronic systolic CHF (congestive heart failure)  (HCC)   . Cigarette smoker   . CKD (chronic kidney disease), stage II    [name]/notes 10/01/2017  . COPD (chronic obstructive pulmonary disease) (HCC) 10/21/2017   on CT scan chest  . History of echocardiogram    a. Echo 5/17 - EF 20-25%, severe diff HK, restrictive physiology, mild to mod MR, severe reduced RVSF, mod RVE, mild RAE, mod TR, PASP 48 mmHg  . Hx of cardiac cath    a. LHC 5/17 - normal coronary arteries. PA 45/25, mean 33, PCWP mean 18  . NICM (nonischemic cardiomyopathy) (HCC)   . Stroke (HCC) 09/27/2017   "was weak on my left side; I'm fully recovered" (11/09/2017)  . Substance abuse (HCC)    cocaine, marijuana    Allergies Allergies  Allergen Reactions  . Hydrocodone Hives  . Lisinopril Swelling and Other (See Comments)    Facial swelling/angioedema  . Prednisone Shortness Of Breath, Nausea Only, Swelling and Other (See Comments)    Also made chest feel tight and genital area, legs, and face became swollen badly  . Bactrim [Sulfamethoxazole-Trimethoprim]   . Penicillins Hives and Swelling     Has patient had a PCN reaction causing immediate rash, facial/tongue/throat swelling, SOB or lightheadedness with hypotension: Yes Has patient had a PCN reaction causing severe rash involving mucus membranes or skin necrosis: No Has patient had a PCN reaction that required hospitalization: No Has patient had a PCN reaction occurring within  the last 10 years: No If all of the above answers are "NO", then may proceed with Cephalosporin use.     IV Location/Drains/Wounds Patient Lines/Drains/Airways Status   Active Line/Drains/Airways    Name:   Placement date:   Placement time:   Site:   Days:   Peripheral IV 02/13/19 Left Forearm   02/13/19    2200    Forearm   1          Labs/Imaging Results for orders placed or performed during the hospital encounter of 02/13/19 (from the past 48 hour(s))  CBC with Differential     Status: Abnormal   Collection Time: 02/13/19 10:43 PM   Result Value Ref Range   WBC 6.0 4.0 - 10.5 K/uL   RBC 4.91 4.22 - 5.81 MIL/uL   Hemoglobin 12.7 (L) 13.0 - 17.0 g/dL   HCT 41.0 39.0 - 52.0 %   MCV 83.5 80.0 - 100.0 fL   MCH 25.9 (L) 26.0 - 34.0 pg   MCHC 31.0 30.0 - 36.0 g/dL   RDW 19.2 (H) 11.5 - 15.5 %   Platelets 211 150 - 400 K/uL   nRBC 0.0 0.0 - 0.2 %   Neutrophils Relative % 48 %   Neutro Abs 2.9 1.7 - 7.7 K/uL   Lymphocytes Relative 42 %   Lymphs Abs 2.5 0.7 - 4.0 K/uL   Monocytes Relative 9 %   Monocytes Absolute 0.5 0.1 - 1.0 K/uL   Eosinophils Relative 1 %   Eosinophils Absolute 0.0 0.0 - 0.5 K/uL   Basophils Relative 0 %   Basophils Absolute 0.0 0.0 - 0.1 K/uL   Immature Granulocytes 0 %   Abs Immature Granulocytes 0.01 0.00 - 0.07 K/uL    Comment: Performed at [facility], [place] [address], [place], [place] [address]  Comprehensive metabolic panel     Status: Abnormal   Collection Time: 02/13/19 10:43 PM  Result Value Ref Range   Sodium 137 135 - 145 mmol/L   Potassium 3.9 3.5 - 5.1 mmol/L   Chloride 98 98 - 111 mmol/L   CO2 26 22 - 32 mmol/L   Glucose, Bld 102 (H) 70 - 99 mg/dL   BUN 29 (H) 6 - 20 mg/dL   Creatinine, Ser 1.36 (H) 0.61 - 1.24 mg/dL   Calcium 9.2 8.9 - 10.3 mg/dL   Total Protein 7.7 6.5 - 8.1 g/dL   Albumin 3.3 (L) 3.5 - 5.0 g/dL   AST 71 (H) 15 - 41 U/L   ALT 32 0 - 44 U/L   Alkaline Phosphatase 128 (H) 38 - 126 U/L   Total Bilirubin 2.4 (H) 0.3 - 1.2 mg/dL   GFR calc non Af Amer >60 >60 mL/min   GFR calc Af Amer >60 >60 mL/min   Anion gap 13 5 - 15    Comment: Performed at [facility], [place] [address], [place], [place] [address]  Brain natriuretic peptide     Status: Abnormal   Collection Time: 02/13/19 10:43 PM  Result Value Ref Range   B Natriuretic Peptide 3,902.9 (H) 0.0 - 100.0 pg/mL    Comment: Performed at [facility], [place] [address], [place], [place] [address]  Digoxin level     Status: Abnormal   Collection Time:  02/13/19 10:43 PM  Result Value Ref Range   Digoxin Level 0.2 (L) 0.8 - 2.0 ng/mL    Comment: Performed at [facility], [address] [place],  [place] [address]  Protime-INR     Status: Abnormal   Collection Time: 02/14/19  1:40 AM  Result Value Ref Range   Prothrombin Time 22.5 (H) 11.4 - 15.2 seconds   INR 2.0 (H) 0.8 - 1.2    Comment: (NOTE) INR goal varies based on device and disease states. Performed at [facility], [address] [place], [place] [address]    Dg Chest 2 View  Result Date: 02/13/2019 CLINICAL DATA:  Allergic reaction. EXAM: CHEST - 2 VIEW COMPARISON:  Chest x-ray dated January 30, 2019 FINDINGS: The heart is significantly enlarged. The lungs are hyperexpanded. There is no pneumothorax. A background of emphysematous changes is suspected bilaterally. There is no acute osseous abnormality. IMPRESSION: No active cardiopulmonary disease. Again noted is significant cardiomegaly. Electronically Signed   By: Katherine Mantle M.D.   On: 02/13/2019 21:24    Pending Labs Unresulted Labs (From admission, onward)    Start     Ordered   02/14/19 0500  Comprehensive metabolic panel  Tomorrow morning,   R     02/14/19 0208   02/14/19 0500  CBC  Tomorrow morning,   R     02/14/19 0208   02/14/19 0500  Troponin I (High Sensitivity)  STAT Now then every 2 hours,   R (with STAT occurrences)    Question Answer Comment  Indication Other   Specify indication chf      02/14/19 0208   02/14/19 0500  Hepatitis panel, acute  Tomorrow morning,   R     02/14/19 0213   02/14/19 0203  Creatinine, serum  (heparin)  Once,   STAT    Comments: Baseline for heparin therapy IF NOT ALREADY DRAWN.    02/14/19 0208   02/14/19 0153  Troponin I (High Sensitivity)  STAT Now then every 2 hours,   R (with STAT occurrences)    Question:  Indication  Answer:  Other   02/14/19 0152   02/14/19 0143  SARS Coronavirus 2 (CEPHEID - Performed in [facility]  hospital lab), Hosp Order  (Asymptomatic Patients Labs)  Once,   STAT    Question:  Rule Out  Answer:  Yes   02/14/19 0142          Vitals/Pain Today's Vitals   02/14/19 0000 02/14/19 0051 02/14/19 0100 02/14/19 0141  BP: (!) 114/99 109/89 131/85 (!) 139/97  Pulse: (!) 117 (!) 111 (!) 111 (!) 110  Resp: (!) 32 (!) 33 (!) 29 (!) 31  Temp:      TempSrc:      SpO2: 99% 100% 100% 100%  PainSc:        Isolation Precautions No active isolations  Medications Medications  heparin injection 5,000 Units (has no administration in time range)  sodium chloride flush (NS) 0.9 % injection 3 mL (3 mLs Intravenous Given 02/14/19 0217)  sodium chloride flush (NS) 0.9 % injection 3 mL (has no administration in time range)  0.9 %  sodium chloride infusion (has no administration in time range)  famotidine (PEPCID) IVPB 20 mg premix (0 mg Intravenous Stopped 02/13/19 2251)  diphenhydrAMINE (BENADRYL) injection 25 mg (25 mg Intravenous Given 02/13/19 2220)  furosemide (LASIX) injection 80 mg (80 mg Intravenous Given 02/14/19 0130)    Mobility walks

## 2019-02-14 NOTE — ED Provider Notes (Signed)
Assumed care from PA Ward at shift change.  See prior notes for full H&P.  Briefly, 43 y.o. M who initially came in for alleged allergic reaction to bactrim that he got from a friend for dental infection, however no significant clinical improvement with epi and benadryl.  He remains tachypneic here, appears clinically fluid overloaded with peripheral edema.  Has been out of coumadin for about 3 days, unknown last check.  Takes 80mg  lasix daily, unsure if he is actually taking this.  Plan:  bipap started, labs pending.  Plan admission.  Results for orders placed or performed during the hospital encounter of 02/13/19  CBC with Differential  Result Value Ref Range   WBC 6.0 4.0 - 10.5 K/uL   RBC 4.91 4.22 - 5.81 MIL/uL   Hemoglobin 12.7 (L) 13.0 - 17.0 g/dL   HCT 67.5 44.9 - 20.1 %   MCV 83.5 80.0 - 100.0 fL   MCH 25.9 (L) 26.0 - 34.0 pg   MCHC 31.0 30.0 - 36.0 g/dL   RDW 00.7 (H) 12.1 - 97.5 %   Platelets 211 150 - 400 K/uL   nRBC 0.0 0.0 - 0.2 %   Neutrophils Relative % 48 %   Neutro Abs 2.9 1.7 - 7.7 K/uL   Lymphocytes Relative 42 %   Lymphs Abs 2.5 0.7 - 4.0 K/uL   Monocytes Relative 9 %   Monocytes Absolute 0.5 0.1 - 1.0 K/uL   Eosinophils Relative 1 %   Eosinophils Absolute 0.0 0.0 - 0.5 K/uL   Basophils Relative 0 %   Basophils Absolute 0.0 0.0 - 0.1 K/uL   Immature Granulocytes 0 %   Abs Immature Granulocytes 0.01 0.00 - 0.07 K/uL  Comprehensive metabolic panel  Result Value Ref Range   Sodium 137 135 - 145 mmol/L   Potassium 3.9 3.5 - 5.1 mmol/L   Chloride 98 98 - 111 mmol/L   CO2 26 22 - 32 mmol/L   Glucose, Bld 102 (H) 70 - 99 mg/dL   BUN 29 (H) 6 - 20 mg/dL   Creatinine, Ser 8.83 (H) 0.61 - 1.24 mg/dL   Calcium 9.2 8.9 - 25.4 mg/dL   Total Protein 7.7 6.5 - 8.1 g/dL   Albumin 3.3 (L) 3.5 - 5.0 g/dL   AST 71 (H) 15 - 41 U/L   ALT 32 0 - 44 U/L   Alkaline Phosphatase 128 (H) 38 - 126 U/L   Total Bilirubin 2.4 (H) 0.3 - 1.2 mg/dL   GFR calc non Af Amer >60 >60  mL/min   GFR calc Af Amer >60 >60 mL/min   Anion gap 13 5 - 15  Brain natriuretic peptide  Result Value Ref Range   B Natriuretic Peptide 3,902.9 (H) 0.0 - 100.0 pg/mL  Digoxin level  Result Value Ref Range   Digoxin Level 0.2 (L) 0.8 - 2.0 ng/mL   Dg Chest 2 View  Result Date: 02/13/2019 CLINICAL DATA:  Allergic reaction. EXAM: CHEST - 2 VIEW COMPARISON:  Chest x-ray dated January 30, 2019 FINDINGS: The heart is significantly enlarged. The lungs are hyperexpanded. There is no pneumothorax. A background of emphysematous changes is suspected bilaterally. There is no acute osseous abnormality. IMPRESSION: No active cardiopulmonary disease. Again noted is significant cardiomegaly. Electronically Signed   By: Katherine Mantle M.D.   On: 02/13/2019 21:24   Dg Chest 2 View  Result Date: 01/30/2019 CLINICAL DATA:  Shortness of breath.  Dental pain. EXAM: CHEST - 2 VIEW COMPARISON:  January 24, 2019  FINDINGS: Stable cardiomegaly. The hila, mediastinum, lungs, and pleura are otherwise unremarkable. IMPRESSION: No active cardiopulmonary disease. Electronically Signed   By: Dorise Bullion III M.D   On: 01/30/2019 19:40   Dg Chest Port 1 View  Result Date: 01/24/2019 CLINICAL DATA:  Shortness of breath. EXAM: PORTABLE CHEST 1 VIEW COMPARISON:  Chest x-ray dated November 09, 2017. FINDINGS: Stable mild cardiomegaly. Normal mediastinal contours. Normal pulmonary vascularity. The lungs remain hyperinflated with emphysematous changes. No focal consolidation, pleural effusion, or pneumothorax. No acute osseous abnormality. IMPRESSION: 1. No active disease. 2. COPD. Electronically Signed   By: Titus Dubin M.D.   On: 01/24/2019 20:32   Patient's BNP is 3900.  He is tolerating bipap well.  Getting IV lasix now.  Plan for admission to hospitalist service.  Discussed with Dr. Maudie Mercury-- will admit to SDU.  COVID test pending, but has had 2 negative screens thus far including one earlier in the month.   Larene Pickett,  PA-C 02/14/19 0224    Ezequiel Essex, MD 02/14/19 [phone]

## 2019-02-14 NOTE — Consult Note (Addendum)
Cardiology Consultation:   Patient ID: Mark [name] Argo MRN: [id number]; DOB: 03/26/1976  Admit date: 02/13/2019 Date of Consult: 02/14/2019  Primary Care Provider: Grayce SessionsEdwards, Michelle P, NP Primary Cardiologist: Verne Carrowhristopher McAlhany, MD  Primary Electrophysiologist:  None  Advanced Heart Failure: Dr. Gala RomneyBensimhon   Patient Profile:   Mark [name] Micco is a 43 y.o. male with a hx of Chronic Systolic Heart Failure (EF 10-15%), NICM by cath in 2017, history of stroke 2019, CKD stage 2, asthma, tobacco abuse, and non-compliance who is being seen today for the evaluation of Acute on Chronic systolic Heart Failure at the request of Dr. Nelson ChimesAmin  History of Present Illness:   Patient follows with HF clinic. [facility] appointment was 6/22 with having sob on exertion. Reported compliance with medications and recent cocaine use. He had missed some previous appointments.  Cardiac history of echo 5/17 revealing EF 20-25%, severe diffuse HK, restrictive physiology, mild to moderate MR, severe RVSF, moderate RVE, mild RAE, mod TR. Cardiac cath in 12/2015 revealed NICM and was started on meds. Patient was then incarcerated 14 months until 2018. He was then admitted 12/2018 for dyspnea and had run out of medications. Echo revealed LV/RVEF 5-10% with large apical thrombus and grade 2 DD and refused to go on coumadin. Admitted 01/24/2019 with acute on chronic HF and was diuresed. He reported running out of medications and was therefore restarted on his medications.   [name] presented to the hospital 6/24 per EMS for possible allergic reaction to bactrim. He reported sob with left facial swelling and and eye swelling. Lungs were CTA. He was tachypenic at 110 and 90-100% O2 RA. CXR was insignifigant. BNP elevated at 3,902.9 and Creatinine was 1.36. ER gave IV Benadryl and 0.3 Epi IM which improved symptoms, but patient was still short of breath. He reported he had not yet taken coumadin that been prescribed 2 days prior and was  unsure if he had been taking prescribed lasix 80mg  daily. Appeared to be fluid overloaded. Patient was started on bipap and IV lasix and admitted.  Today patient reports he is feeling better overall but still having trouble breathing. He denies any CP or palpitations. Patient says he had been taking lasix as prescribed prior to admission but was still having sob on exertion. He reports he quit smoking 1 week ago.    Heart Pathway Score:     Past Medical History:  Diagnosis Date   Asthma    Chronic systolic CHF (congestive heart failure) (HCC)    Cigarette smoker    CKD (chronic kidney disease), stage II    /notes 10/01/2017   COPD (chronic obstructive pulmonary disease) (HCC) 10/21/2017   on CT scan chest   History of echocardiogram    a. Echo 5/17 - EF 20-25%, severe diff HK, restrictive physiology, mild to mod MR, severe reduced RVSF, mod RVE, mild RAE, mod TR, PASP 48 mmHg   Hx of cardiac cath    a. LHC 5/17 - normal coronary arteries. PA 45/25, mean 33, PCWP mean 18   NICM (nonischemic cardiomyopathy) (HCC)    Stroke (HCC) 09/27/2017   "was weak on my left side; I'm fully recovered" (11/09/2017)   Substance abuse (HCC)    cocaine, marijuana    Past Surgical History:  Procedure Laterality Date   CARDIAC CATHETERIZATION N/A 01/05/2016   Procedure: Right/Left Heart Cath and Coronary Angiography;  Surgeon: Lennette Biharihomas A Kelly, MD;  Location: [facility];  Service: Cardiovascular;  Laterality: N/A;  Home Medications:  Prior to Admission medications   Medication Sig Start Date End Date Taking? Authorizing Provider  albuterol (VENTOLIN HFA) 108 (90 Base) MCG/ACT inhaler Inhale 1-2 puffs into the lungs every 4 (four) hours as needed for wheezing or shortness of breath. 12/30/18  Yes Sheikh, Omair Latif, DO  aspirin 81 MG EC tablet Take 1 tablet (81 mg total) by mouth daily. Patient taking differently: Take 162 mg by mouth daily.  12/30/18  Yes Sheikh, Omair Latif, DO    atorvastatin (LIPITOR) 40 MG tablet Take 40 mg by mouth daily.   Yes [provider]  digoxin (LANOXIN) 0.125 MG tablet Take 1 tablet (0.125 mg total) by mouth daily. 12/30/18  Yes Sheikh, Omair Latif, DO  ferrous sulfate 325 (65 FE) MG EC tablet Take 325 mg by mouth daily.   Yes [provider]  furosemide (LASIX) 80 MG tablet Take 1 tablet (80 mg total) by mouth daily. 12/31/18  Yes Sheikh, Omair Latif, DO  losartan (COZAAR) 25 MG tablet Take 1 tablet (25 mg total) by mouth at bedtime. DISCUSS WITH CARDIOLOGY ABOUT WHEN TO START Patient taking differently: Take 25 mg by mouth daily. DISCUSS WITH CARDIOLOGY ABOUT WHEN TO START 12/30/18 03/30/19 Yes Sheikh, Kateri Mcmair Latif, DO  Multiple Vitamin (MULTIVITAMIN WITH MINERALS) TABS tablet Take 1 tablet by mouth daily.   Yes [provider]  warfarin (COUMADIN) 5 MG tablet Take 1 tablet daily or as directed by Coumadin Clinic. Patient taking differently: Take 5 mg by mouth daily at 6 PM.  02/13/19  Yes Bensimhon, Bevelyn Bucklesaniel R, MD  budesonide-formoterol [facility](SYMBICORT) 80-4.5 MCG/ACT inhaler Inhale 2 puffs into the lungs 2 (two) times daily. Patient not taking: Reported on 02/13/2019 02/13/19   Grayce SessionsEdwards, Michelle P, NP  nicotine (NICODERM CQ - DOSED IN MG/24 HOURS) 14 mg/24hr patch Place 1 patch (14 mg total) onto the skin daily. Patient not taking: Reported on 02/11/2019 12/31/18   Merlene LaughterSheikh, Omair Latif, DO    Inpatient Medications: Scheduled Meds:  aspirin EC  81 mg Oral Daily   atorvastatin  40 mg Oral q1800   chlorhexidine  15 mL Mouth Rinse BID   [START ON 02/15/2019] Chlorhexidine Gluconate Cloth  6 each Topical Daily   digoxin  0.125 mg Oral Daily   ferrous sulfate  325 mg Oral Daily   furosemide  80 mg Intravenous Daily   losartan  25 mg Oral QHS   mouth rinse  15 mL Mouth Rinse q12n4p   mometasone-formoterol  2 puff Inhalation BID   multivitamin with minerals  1 tablet Oral Daily   nicotine  14 mg Transdermal Daily    sodium chloride flush  3 mL Intravenous Q12H   Continuous Infusions:  sodium chloride     PRN Meds: sodium chloride, albuterol, alum & mag hydroxide-simeth, hydrALAZINE, hydrocortisone, hydrocortisone cream, lip balm, loratadine, Muscle Rub, phenol, polyethylene glycol, polyvinyl alcohol, senna-docusate, sodium chloride, sodium chloride flush  Allergies:    Allergies  Allergen Reactions   Hydrocodone Hives   Lisinopril Swelling and Other (See Comments)    Facial swelling/angioedema   Prednisone Shortness Of Breath, Nausea Only, Swelling and Other (See Comments)    Also made chest feel tight and genital area, legs, and face became swollen badly   Bactrim [Sulfamethoxazole-Trimethoprim]    Penicillins Hives and Swelling     Has patient had a PCN reaction causing immediate rash, facial/tongue/throat swelling, SOB or lightheadedness with hypotension: Yes Has patient had a PCN reaction causing severe rash involving mucus membranes  or skin necrosis: No Has patient had a PCN reaction that required hospitalization: No Has patient had a PCN reaction occurring within the last 10 years: No If all of the above answers are "NO", then may proceed with Cephalosporin use.     Social History:   Social History   Socioeconomic History   Marital status: Divorced    Spouse name: Not on file   Number of children: Not on file   Years of education: Not on file   Highest education level: Not on file  Occupational History   Not on file  Social Needs   Financial resource strain: Not on file   Food insecurity    Worry: Not on file    Inability: Not on file   Transportation needs    Medical: Not on file    Non-medical: Not on file  Tobacco Use   Smoking status: Former Smoker    Packs/day: 1.00    Years: 30.00    Pack years: 30.00    Types: Cigarettes    Quit date: 09/27/2017    Years since quitting: 1.3   Smokeless tobacco: Never Used  Substance and Sexual Activity   Alcohol  use: Yes    Alcohol/week: 5.0 standard drinks    Types: 5 Shots of liquor per week    Frequency: Never    Comment: 5-6 shots of vodka daily; "stopped it all after I had stroke 09/27/2017"   Drug use: Yes    Types: Cocaine, Marijuana    Comment: 11/09/2017 "none since 09/27/2017"   Sexual activity: Not Currently  Lifestyle   Physical activity    Days per week: Not on file    Minutes per session: Not on file   Stress: Not on file  Relationships   Social connections    Talks on phone: Not on file    Gets together: Not on file    Attends religious service: Not on file    Active member of club or organization: Not on file    Attends meetings of clubs or organizations: Not on file    Relationship status: Not on file   Intimate partner violence    Fear of current or ex partner: Not on file    Emotionally abused: Not on file    Physically abused: Not on file    Forced sexual activity: Not on file  Other Topics Concern   Not on file  Social History Narrative   Not on file    Family History:    Family History  Problem Relation Age of Onset   Cardiomyopathy Father        Reports his father has an LVAD   Heart failure Father    Hypertension Father    Heart disease Maternal Grandmother        had a whole in her heart   Deep vein thrombosis Neg Hx      ROS:  Please see the history of present illness.  All other ROS reviewed and negative.     Physical Exam/Data:   Vitals:   02/14/19 0500 02/14/19 0647 02/14/19 0700 02/14/19 0800  BP: (!) 130/114  (!) 145/99 (!) 124/107  Pulse:      Resp: (!) 23  (!) 24 (!) 22  Temp:  (!) 97.4 F (36.3 [name])    TempSrc:  Oral    SpO2:    98%  Weight:        Intake/Output Summary (Last 24 hours) at 02/14/2019 1009 Last data  filed at 02/14/2019 0600 Gross per 24 hour  Intake 50 ml  Output 500 ml  Net -450 ml   Last 3 Weights 02/14/2019 01/30/2019 01/25/2019  Weight (lbs) 154 lb 8.7 oz 156 lb 12 oz 156 lb 12 oz  Weight (kg) 70.1 kg  71.1 kg 71.1 kg     Body mass index is 21.55 kg/m.  General:  Thin appearing AAM; appears older than age; in no acute distress; dyspneic during conversation HEENT: normal Lymph: no adenopathy Neck: + JVD Endocrine:  No thryomegaly Vascular: No carotid bruits; FA pulses 2+ bilaterally without bruits  Cardiac:  normal S1, S2; RRR; no murmur  Lungs: 3L O2 [place], slight wheezing right side, no rhonchi or rales  Abd: mildly distended nontender, no hepatomegaly  Ext: bilateral pedal edema Musculoskeletal:  No deformities, BUE and BLE strength normal and equal Skin: warm and dry  Neuro:  CNs 2-12 intact, no focal abnormalities noted Psych:  Normal affect   EKG:  The EKG was personally reviewed and demonstrates:   EKG 6/25 sinus tachycardia, HR 116, Incomplete RBBB  Telemetry:  Telemetry was personally reviewed and demonstrates:  NSR, HR 90s, occasional PVCs, no Ventricular arhythmias  Relevant CV Studies: Echo 12/2018  1. The right ventricle has severely reduced systolic function. The cavity was severely enlarged. There is no increase in right ventricular wall thickness.  2. Left atrial size was mildly dilated.  3. Right atrial size was moderately dilated.  4. Severely thickened tricuspid valve leaflets.  5. The mitral valve is grossly normal. Mitral valve regurgitation is moderate to severe by color flow Doppler. The MR jet is posteriorly-directed.  6. The tricuspid valve is grossly normal.  7. The pulmonic valve was grossly normal. Pulmonic valve regurgitation is moderate is mild by color flow Doppler.  Moderate LV dilatation and severe systolic dysfunction with LVEF 5-10% and diffuse hypokinesis. 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 Grade 2 diastolic dysfunction with severely elevated filling pressures.  Heart Cath 12/2015  Dilated congestive nonischemic cardiomyopathy with echo Doppler assessment of EF at 20 - 25%. Elevation of right heart pressures with  moderate pulmonary hypertension. Normal coronary arteries. Right Heart Pressures Hemodynamic findings consistent with moderate pulmonary hypertension. Elevated LV EDP consistent with volume overload. RA: 19/14; mean 13 RV: 46/19 PA: 45/25; mean 33 PW: A wave 23, V-wave 19, mean 18.  AO: 98/74 PA: 43/21  LV: 97/12/29 PW: 27/21;  Mean 22  LV: 97/14/25 AO: 95/73  Oxygen saturation: AO 91%; PA 41%  Cardiac output: 3.13 (Fick); 5.6 L/m (thermodilution) Cardiac index:  1.53             2.75 liters per minute per meter squared      Laboratory Data:  High Sensitivity Troponin:   Recent Labs  Lab 02/14/19 0415 02/14/19 0730  TROPONINIHS 50.0* 44.2*     Cardiac EnzymesNo results for input(s): TROPONINI in the last 168 hours. No results for input(s): TROPIPOC in the last 168 hours.  Chemistry Recent Labs  Lab 02/13/19 2243 02/14/19 0415  NA 137 136  K 3.9 3.6  CL 98 99  CO2 26 24  GLUCOSE 102* 100*  BUN 29* 28*  CREATININE 1.36* 1.17   1.25*  CALCIUM 9.2 9.2  GFRNONAA >60 >60   >60  GFRAA >60 >60   >60  ANIONGAP 13 13    Recent Labs  Lab 02/13/19 2243 02/14/19 0415  PROT 7.7 7.6  ALBUMIN 3.3* 3.4*  AST  71* 66*  ALT 32 34  ALKPHOS 128* 116  BILITOT 2.4* 3.0*   Hematology Recent Labs  Lab 02/13/19 2243 02/14/19 0415  WBC 6.0 5.7  RBC 4.91 4.67  HGB 12.7* 12.2*  HCT 41.0 38.5*  MCV 83.5 82.4  MCH 25.9* 26.1  MCHC 31.0 31.7  RDW 19.2* 18.7*  PLT 211 160   BNP Recent Labs  Lab 02/13/19 2243  BNP 3,902.9*    DDimer No results for input(s): DDIMER in the last 168 hours.   Radiology/Studies:  Dg Chest 2 View  Result Date: 02/13/2019 CLINICAL DATA:  Allergic reaction. EXAM: CHEST - 2 VIEW COMPARISON:  Chest x-ray dated January 30, 2019 FINDINGS: The heart is significantly enlarged. The lungs are hyperexpanded. There is no pneumothorax. A background of emphysematous changes is suspected bilaterally. There is no acute osseous abnormality. IMPRESSION: No  active cardiopulmonary disease. Again noted is significant cardiomegaly. Electronically Signed   By: Katherine Mantlehristopher  Green M.D.   On: 02/13/2019 21:24   [place] Abdomen Limited Ruq  Result Date: 02/14/2019 CLINICAL DATA:  Abnormal LFTs. EXAM: ULTRASOUND ABDOMEN LIMITED RIGHT UPPER QUADRANT COMPARISON:  None. FINDINGS: Gallbladder: There is diffuse gallbladder wall thickening with the gallbladder measuring approximately 14 mm in thickness. The sonographic Eulah PontMurphy sign is negative. There is pericholecystic free fluid. No gallstones are visualized. Common bile duct: Diameter: 2 mm Liver: The liver is heterogeneous. There is no discrete hepatic mass. There appears to be a somewhat nodular contour. There is a small amount of free fluid throughout the abdomen. Portal vein is patent on color Doppler imaging with normal direction of blood flow towards the liver. IMPRESSION: 1. Diffuse gallbladder wall thickening in the absence of gallstones or a positive sonographic Murphy sign. Gallbladder wall thickening is nonspecific and can be seen in patients with ascites, heart failure, or hepatocellular dysfunction. 2. Small volume abdominal ascites. 3. Heterogeneous liver with a somewhat nodular contour is suspicious for cirrhosis. Electronically Signed   By: Katherine Mantlehristopher  Green M.D.   On: 02/14/2019 03:42    Assessment and Plan:   1. Acute on Chronic Biventricular Heart Failure: Echo 12/2018 LVEF 5-10% with severely reduced RV.  Patient has 2 recent hospitalizations for acute Heart Failure for which he reports running out of meds. Patients last visit with HF clinic 6/22 reporting mild sob and compliance with meds. Patient presented to the ER 6/24 with dyspnea. BNP elevated on admission 3,902.9 and appeared fluid overloaded. Since admission patient has been on IV lasix 80mg . Cr 1.25>>1.17. Total urine output is 500cc. Since admission output is -450cc. Echo ordered. Patient cannot take bb due to cocaine use.  Continue digoxin. Given  diuresis with IV lasix closely monitor potasium and Cr. Avoid hypokalemia.  Patient overall feeling better today. He denies CP. He has some sob during conversation as well as pedal edema. He does have supplemental O2 requirements, currently at 3L [place]. Continue to diurese until euvolemic and back to baseline from respiratory standpoint. Will order strictc I/Os and low salt diet. Continue daily weights. Continue Losartan daily.  We decided to cancel the echo as reassessment will not change the management. Last echo in May.   2. Elevated Troponin: in the setting of acute heart failure. Level 50 upon admission. Previous heart cath in 12/2015 revealed clear arteries.  3. Apical Thrombus: was discovered in 12/2018 Echo but only recently agreed to start coumadin. Upon Ed arrival patient reports he had not yet taken coumadin. Restarted today-pharmacy assisting with dosing. Check PT/INR. Follow up in  coumadin clinic post-discharge.  4. Facial Swelling: possibly due to bactrim. He has a history of angioedema with Lisinopril and Entresto. Patient had been taking Losartan. While this could be related to bactrim use, cannot rule out possibility of recurrent angioedema. May need to consider permanent discontinuation of ARB. If discontinued could use nitrates and hydralazine in place of ARB for after load reductio for chronic systolic heart failure. Will discuss with MD.  5. Tobacco abuse: reports quiting 1 week ago  6. H/O CVA 09/2017: on aspirin and statin   For questions or updates, please contact [facility] Please consult [email or web address] for contact info under     Signed, Cadence David Stall, PA-[name]  02/14/2019 10:09 AM   History and all data above reviewed.  Patient examined.  I agree with the findings as above.  The patient presents with an allergic reaction to possibly Bactrim.  He said he was feeling okay up to this point.  He has a severe nonischemic cardiomyopathy.  We went through his charts quite a bit  and he has a long history of difficulty with compliance.  He has difficulty getting his medications.  Difficulty with transportation.  There are canceled appointments.  There are some missed appointments.  He says he takes his medicines when he can get some but he reports that the information about how to get his medications is conflicting.  He says he has had some home health visits with our para medicine program but that they have been out recently.  He has had some increasing leg swelling.  He denies any chest pain, neck or arm discomfort.  He does have some increased shortness of breath but he is not describing new PND or orthopnea.  The patient exam reveals COR:RRR  ,  Lungs: Decreased breath sounds  ,  Abd: Positive bowel sounds, no rebound no guarding, Ext No edema  .  All available labs, radiology testing, previous records reviewed. Agree with documented assessment and plan.   CHF Stage III - IV: This is very unfortunate.  He has a severely reduced ejection fraction.  There is no reason to repeat the echocardiogram.  His EF was 10% 6 weeks ago with LV thrombus.  At this point he does not seem to be in shock and we might be able to just diurese him with IV Lasix.  However, if he develops progressive hypertension or renal insufficiency he might need his CVP to be transduced, co-ox is to be followed and inotropic therapy.  Ultimately however, the endpoint of this type of therapy needs to be discussed.  He is not a candidate for advanced therapies given his ongoing drug use.  He could try med titration and should try to get him on warfarin for his LV thrombus during this admission.  However, med titration will be limited in part because of his ability to get medications and present for follow-up.  Between this and his ongoing substance abuse it would not be inappropriate to talk about palliative issues with this patient as well.  His prognosis overall is very guarded long-term.  He is high risk for decompensated  heart failure admissions and death.  Fayrene Fearing Amiaya Mcneeley  12:11 PM  02/14/2019

## 2019-02-14 NOTE — Progress Notes (Signed)
[facility] for warfarin Indication: left ventricular thrombus  Allergies  Allergen Reactions  . Hydrocodone Hives  . Lisinopril Swelling and Other (See Comments)    Facial swelling/angioedema  . Prednisone Shortness Of Breath, Nausea Only, Swelling and Other (See Comments)    Also made chest feel tight and genital area, legs, and face became swollen badly  . Bactrim [Sulfamethoxazole-Trimethoprim]   . Penicillins Hives and Swelling     Has patient had a PCN reaction causing immediate rash, facial/tongue/throat swelling, SOB or lightheadedness with hypotension: Yes Has patient had a PCN reaction causing severe rash involving mucus membranes or skin necrosis: No Has patient had a PCN reaction that required hospitalization: No Has patient had a PCN reaction occurring within the last 10 years: No If all of the above answers are "NO", then may proceed with Cephalosporin use.        Vital Signs: Temp: 97.4 F (36.3 C) (06/25 1200) Temp Source: Oral (06/25 1200) BP: 102/72 (06/25 1300) Pulse Rate: 105 (06/25 1300)  Labs: Recent Labs    02/13/19 2243 02/14/19 0140 02/14/19 0415  HGB 12.7*  --  12.2*  HCT 41.0  --  38.5*  PLT 211  --  160  LABPROT  --  22.5*  --   INR  --  2.0*  --   CREATININE 1.36*  --  1.17  1.25*    Estimated Creatinine Clearance: 74.8 mL/min (A) (by C-G formula based on SCr of 1.25 mg/dL (H)).   Medications:  Scheduled:  . aspirin EC  81 mg Oral Daily  . atorvastatin  40 mg Oral q1800  . chlorhexidine  15 mL Mouth Rinse BID  . [START ON 02/15/2019] Chlorhexidine Gluconate Cloth  6 each Topical Daily  . digoxin  0.125 mg Oral Daily  . ferrous sulfate  325 mg Oral Daily  . furosemide  80 mg Intravenous BID  . losartan  25 mg Oral QHS  . mouth rinse  15 mL Mouth Rinse q12n4p  . mometasone-formoterol  2 puff Inhalation BID  . multivitamin with minerals  1 tablet Oral Daily  . nicotine  14 mg Transdermal Daily   . potassium chloride  40 mEq Oral Once  . sodium chloride flush  3 mL Intravenous Q12H   Infusions:  . sodium chloride      Assessment: 59 yoM admitted with alleged allergic rxn to bactrim. On PTA warfarin for left ventricular thrombus; pharmacy to continue while inpatient.   Baseline INR therapeutic  Prior anticoagulation: warfarin 5 mg daily, LD 6/24  Significant events:  Today, 02/14/2019:  CBC: Hgb low but stable overnight, Plt WNL  INR therapeutic  Major drug interactions: none, also on ASA per cardiology  No bleeding issues per nursing  Diet ordered  Goal of Therapy: INR 2-3  Plan:  Resume warfarin 5 mg PO daily, without acute illness, don't expect warfarin sensitivity to rise during admission  Daily INR  CBC at least q72 hr while on warfarin  Monitor for signs of bleeding or thrombosis   Reuel Boom, PharmD, BCPS [phone] 02/14/2019, 1:33 PM

## 2019-02-14 NOTE — Progress Notes (Signed)
Pt seen, watching tv, no increased wob/respiratory distress noted or voiced by pt at this time.  HR94, rr23, spo2 100% on 3lnc.  Bipap remains in room on standby but not indicated at this time.  RT will continue to monitor and assist with bipap as needed.

## 2019-02-14 NOTE — Progress Notes (Signed)
Pt had a 8 beat run of Vtach at 1415. Pt was asymptomatic and just resting in bed. RN notified Amin MD. RN gave pt scheduled dose of potassium. Pt was hesitant to take it because the pills looked different than the one he takes at home. RN explained to him that the packaging, shape, and color of pills are different in the hospital. Pt still irritated about pills being different and how pills are scheduled at different times throughout the day, but agreed to take the potassium. Will continue to monitor.

## 2019-02-14 NOTE — TOC Initial Note (Signed)
Transition of Care [facility]) - Initial/Assessment Note    Patient Details  Name: Mark [name] MRN: [id number] Date of Birth: 1976-05-27  Transition of Care [facility]) CM/SW Contact:    Dessa Phi, RN Phone Number: 02/14/2019, 3:14 PM  Clinical Narrative:From home. May need med assistance;high risk-med non compliance. Continue to assess d/c plans.                   Expected Discharge Plan: Home/Self Care Barriers to Discharge: Continued Medical Work up   Patient Goals and CMS Choice        Expected Discharge Plan and Services Expected Discharge Plan: Home/Self Care   Discharge Planning Services: CM Consult, Medication Assistance     Expected Discharge Date: (unknown)                                    Prior Living Arrangements/Services   Lives with:: Significant Other Patient language and need for interpreter reviewed:: Yes        Need for Family Participation in Patient Care: No (Comment) Care giver support system in place?: Yes (comment)   Criminal Activity/Legal Involvement Pertinent to Current Situation/Hospitalization: No - Comment as needed  Activities of Daily Living Home Assistive Devices/Equipment: None ADL Screening (condition at time of admission) Patient's cognitive ability adequate to safely complete daily activities?: Yes Is the patient deaf or have difficulty hearing?: No Does the patient have difficulty seeing, even when wearing glasses/contacts?: No Does the patient have difficulty concentrating, remembering, or making decisions?: No Patient able to express need for assistance with ADLs?: Yes Does the patient have difficulty dressing or bathing?: No Independently performs ADLs?: Yes (appropriate for developmental age) Does the patient have difficulty walking or climbing stairs?: No Weakness of Legs: None Weakness of Arms/Hands: None  Permission Sought/Granted   Permission granted to share information with : Yes, Verbal Permission  Granted              Emotional Assessment Appearance:: Appears stated age Attitude/Demeanor/Rapport: Gracious Affect (typically observed): Accepting Orientation: : Oriented to Self, Oriented to Place, Oriented to  Time, Oriented to Situation Alcohol / Substance Use: Tobacco Use, Alcohol Use, Illicit Drugs Psych Involvement: No (comment)  Admission diagnosis:  Shortness of breath [R06.02] Abnormal liver function [R94.5] Acute on chronic systolic congestive heart failure (HCC) [I50.23] Patient Active Problem List   Diagnosis Date Noted  . Allergic reaction caused by a drug 02/14/2019  . Abnormal liver function 02/14/2019  . Encounter for therapeutic drug monitoring 02/13/2019  . Encounter to establish care 02/13/2019  . 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 Acute CHF (congestive heart failure) ([place]) 01/24/2019  . SOB (shortness of breath)   . Cocaine use   . Acute on chronic systolic CHF (congestive heart failure) ([place]) 12/25/2018  . Demand ischemia ([place])   . LV (left ventricular) mural thrombus without MI   . Noncompliance   . Chronic anemia   . Acute systolic CHF (congestive heart failure) ([place]) 11/09/2017  . History of right MCA stroke 09/28/2017  . Stroke ([place]) 09/28/2017  . Chronic systolic heart failure ([place]) 01/11/2016  . NICM (nonischemic cardiomyopathy) ([place]) 01/11/2016  . CKD (chronic kidney disease) stage 2, GFR 60-89 ml/min 01/11/2016  . Cocaine abuse ([place]) 01/11/2016  . Chest pain, pleuritic 01/03/2016  . Tobacco abuse 01/03/2016  . Asthma 01/03/2016  . Leg swelling 01/03/2016  . Tachycardia 01/03/2016  .  Normocytic anemia 01/03/2016  . Elevated troponin I level 01/03/2016  . Right rib fracture 01/03/2016  . Chest pain    PCP:  Grayce Sessions, NP Pharmacy:   [facility] - [place], [place] - [place] [address] [place] [address] Phone: [phone] Fax: [phone]     Social  Determinants of Health (SDOH) Interventions    Readmission Risk Interventions Readmission Risk Prevention Plan 02/14/2019 01/25/2019  Transportation Screening Complete Complete  PCP or Specialist Appt within 3-5 Days Not Complete Complete  Not Complete comments continue to assess prior d/c. -  HRI or Home Care Consult Not Complete Complete  HRI or Home Care Consult comments continue to assess -  Social Work Consult for Recovery Care Planning/Counseling Not Complete Complete  SW consult not completed comments continue to assess -  Palliative Care Screening Not Applicable Complete  Medication Review Oceanographer) Not Complete Complete  Med Review Comments continue to assess till d/c -  Some recent data might be hidden

## 2019-02-14 NOTE — Progress Notes (Signed)
Pt refused to put BiPap back on. Pt educated on the importance of the BiPap. Put him back on 3L . RT made aware.  Mariann Laster RN

## 2019-02-14 NOTE — Progress Notes (Signed)
Pt. transported from ED R-A to ICU/SD-29 uneventfully, covering RT made aware.

## 2019-02-14 NOTE — H&P (Addendum)
TRH H&P    Patient Demographics:    Mark [name], is a 43 y.o. male  MRN: [id number]  DOB - [birth year]  Admit Date - 02/13/2019  Referring MD/NP/PA:  Sharilyn Sites  Outpatient Primary MD for the patient is Grayce Sessions, NP  Patient coming from: home  Chief complaint- allerghic reaction,  dyspnea   HPI:    Mark [name]  is a 43 y.o. male, w h/o polysubstance abuse, CKD stage2, Chronic systolic CHF (EF 29%), nonischemic CM, LV thrombus,  h/o stroke, Asthma, Tobacco use, apparently presented earlier today for allerghic reaction to Bactrim. Pt had sob and eye swelling and L facial swelling,  tx with epi, benadryl.  Pt continued to have dyspnea.   In ED,  T 97.6, P 110, R 31, Bp 139/97  Pox 100%  Wbc 6.0, Hgb 12.7, Plt 211 Na 137, K 3.9, Bun 29, Creatinine 1.36 Glucose 102  CXR IMPRESSION: No active cardiopulmonary disease. Again noted is significant cardiomegaly.  BNP 3,902  EKG sinus tach at 115, RAD, q in v1-3, st elevation in v4,5 (old), present on prior ekg 01/31/19)  Pt given benadryl and pepcid in the ED for allerghic reaction and then  Pt given lasix 80mg  iv x1 in ED for CHF.   Pt will be admitted for acute systolic CHF, probably resulting from tachycardia from epi for allerghic reaction.          Review of systems:    In addition to the HPI above,  No Fever-chills, No Headache, No changes with Vision or hearing, No problems swallowing food or Liquids, No Chest pain  No Abdominal pain, No Nausea or Vomiting, bowel movements are regular, No Blood in stool or Urine, No dysuria, No new skin rashes or bruises, No new joints pains-aches,  No new weakness, tingling, numbness in any extremity, No recent weight gain or loss, No polyuria, polydypsia or polyphagia, No significant Mental Stressors.  All other systems reviewed and are negative.    Past History of the following  :    Past Medical History:  Diagnosis Date  . Asthma   . Chronic systolic CHF (congestive heart failure) (HCC)   . Cigarette smoker   . CKD (chronic kidney disease), stage II    [name] 10/01/2017  . History of echocardiogram    a. Echo 5/17 - EF 20-25%, severe diff HK, restrictive physiology, mild to mod MR, severe reduced RVSF, mod RVE, mild RAE, mod TR, PASP 48 mmHg  . Hx of cardiac cath    a. LHC 5/17 - normal coronary arteries. PA 45/25, mean 33, PCWP mean 18  . NICM (nonischemic cardiomyopathy) (HCC)   . Stroke (HCC) 09/27/2017   "was weak on my left side; I'm fully recovered" (11/09/2017)  . Substance abuse (HCC)    cocaine, marijuana      Past Surgical History:  Procedure Laterality Date  . CARDIAC CATHETERIZATION N/A 01/05/2016   Procedure: Right/Left Heart Cath and Coronary Angiography;  Surgeon: Lennette Bihari, MD;  Location: [facility] INVASIVE CV LAB;  Service:  Cardiovascular;  Laterality: N/A;      Social History:      Social History   Tobacco Use  . Smoking status: Former Smoker    Packs/day: 1.00    Years: 30.00    Pack years: 30.00    Types: Cigarettes    Quit date: 09/27/2017    Years since quitting: 1.3  . Smokeless tobacco: Never Used  Substance Use Topics  . Alcohol use: Yes    Alcohol/week: 5.0 standard drinks    Types: 5 Shots of liquor per week    Frequency: Never    Comment: 5-6 shots of vodka daily; "stopped it all after I had stroke 09/27/2017"       Family History :     Family History  Problem Relation Age of Onset  . Cardiomyopathy Father        Reports his father has an LVAD  . Heart failure Father   . Hypertension Father   . Deep vein thrombosis Neg Hx        Home Medications:   Prior to Admission medications   Medication Sig Start Date End Date Taking? Authorizing Provider  albuterol (VENTOLIN HFA) 108 (90 Base) MCG/ACT inhaler Inhale 1-2 puffs into the lungs every 4 (four) hours as needed for wheezing or shortness of breath.  12/30/18  Yes Sheikh, Omair Latif, DO  aspirin 81 MG EC tablet Take 1 tablet (81 mg total) by mouth daily. Patient taking differently: Take 162 mg by mouth daily.  12/30/18  Yes Sheikh, Omair Latif, DO  atorvastatin (LIPITOR) 40 MG tablet Take 40 mg by mouth daily.   Yes [provider]  digoxin (LANOXIN) 0.125 MG tablet Take 1 tablet (0.125 mg total) by mouth daily. 12/30/18  Yes Sheikh, Omair Latif, DO  ferrous sulfate 325 (65 FE) MG EC tablet Take 325 mg by mouth daily.   Yes [provider]  furosemide (LASIX) 80 MG tablet Take 1 tablet (80 mg total) by mouth daily. 12/31/18  Yes Sheikh, Omair Latif, DO  losartan (COZAAR) 25 MG tablet Take 1 tablet (25 mg total) by mouth at bedtime. DISCUSS WITH CARDIOLOGY ABOUT WHEN TO START Patient taking differently: Take 25 mg by mouth daily. DISCUSS WITH CARDIOLOGY ABOUT WHEN TO START 12/30/18 03/30/19 Yes Sheikh, Kateri Mcmair Latif, DO  Multiple Vitamin (MULTIVITAMIN WITH MINERALS) TABS tablet Take 1 tablet by mouth daily.   Yes [provider]  warfarin (COUMADIN) 5 MG tablet Take 1 tablet daily or as directed by Coumadin Clinic. Patient taking differently: Take 5 mg by mouth daily at 6 PM.  02/13/19  Yes Bensimhon, Bevelyn Bucklesaniel R, MD  budesonide-formoterol [facility](SYMBICORT) 80-4.5 MCG/ACT inhaler Inhale 2 puffs into the lungs 2 (two) times daily. Patient not taking: Reported on 02/13/2019 02/13/19   Grayce SessionsEdwards, Michelle P, NP  nicotine (NICODERM CQ - DOSED IN MG/24 HOURS) 14 mg/24hr patch Place 1 patch (14 mg total) onto the skin daily. Patient not taking: Reported on 02/11/2019 12/31/18   Merlene LaughterSheikh, Omair Latif, DO     Allergies:     Allergies  Allergen Reactions  . Hydrocodone Hives  . Lisinopril Swelling and Other (See Comments)    Facial swelling/angioedema  . Prednisone Shortness Of Breath, Nausea Only, Swelling and Other (See Comments)    Also made chest feel tight and genital area, legs, and face became swollen badly  . Bactrim  [Sulfamethoxazole-Trimethoprim]   . Penicillins Hives and Swelling     Has patient had a PCN reaction causing immediate rash, facial/tongue/throat  swelling, SOB or lightheadedness with hypotension: Yes Has patient had a PCN reaction causing severe rash involving mucus membranes or skin necrosis: No Has patient had a PCN reaction that required hospitalization: No Has patient had a PCN reaction occurring within the last 10 years: No If all of the above answers are "NO", then may proceed with Cephalosporin use.      Physical Exam:   Vitals  Blood pressure (!) 139/97, pulse (!) 110, temperature 97.6 F (36.4 C), temperature source Oral, resp. rate (!) 31, SpO2 100 %.  1.  General: axoxo3  2. Psychiatric: euthymic  3. Neurologic: cn2-12 intact, reflexes 2+ symmetric, diffuse with no clonus, motor 5/5 in all 4 ext  4. HEENMT:  Anicteric, pupils 1.32mm symmetric, direct, consensual, near intact Mucous membranes moist Neck+ jvd   5. Respiratory : Slight bibasilar crackles, slight exp wheezing.   6. Cardiovascular : rrr s1, s2, 2/6 sem apex  7. Gastrointestinal:  Abd: soft, nt, nd, +bs  8. Skin:  Ext: no c/c/e,  No rash  9.Musculoskeletal:  Good ROM  No adenopathy    Data Review:    CBC Recent Labs  Lab 02/13/19 2243  WBC 6.0  HGB 12.7*  HCT 41.0  PLT 211  MCV 83.5  MCH 25.9*  MCHC 31.0  RDW 19.2*  LYMPHSABS 2.5  MONOABS 0.5  EOSABS 0.0  BASOSABS 0.0   ------------------------------------------------------------------------------------------------------------------  Results for orders placed or performed during the hospital encounter of 02/13/19 (from the past 48 hour(s))  CBC with Differential     Status: Abnormal   Collection Time: 02/13/19 10:43 PM  Result Value Ref Range   WBC 6.0 4.0 - 10.5 K/uL   RBC 4.91 4.22 - 5.81 MIL/uL   Hemoglobin 12.7 (L) 13.0 - 17.0 g/dL   HCT 41.0 39.0 - 52.0 %   MCV 83.5 80.0 - 100.0 fL   MCH 25.9 (L) 26.0 - 34.0  pg   MCHC 31.0 30.0 - 36.0 g/dL   RDW 19.2 (H) 11.5 - 15.5 %   Platelets 211 150 - 400 K/uL   nRBC 0.0 0.0 - 0.2 %   Neutrophils Relative % 48 %   Neutro Abs 2.9 1.7 - 7.7 K/uL   Lymphocytes Relative 42 %   Lymphs Abs 2.5 0.7 - 4.0 K/uL   Monocytes Relative 9 %   Monocytes Absolute 0.5 0.1 - 1.0 K/uL   Eosinophils Relative 1 %   Eosinophils Absolute 0.0 0.0 - 0.5 K/uL   Basophils Relative 0 %   Basophils Absolute 0.0 0.0 - 0.1 K/uL   Immature Granulocytes 0 %   Abs Immature Granulocytes 0.01 0.00 - 0.07 K/uL    Comment: Performed at [facility], [place] [address] [place], [place] [address]  Comprehensive metabolic panel     Status: Abnormal   Collection Time: 02/13/19 10:43 PM  Result Value Ref Range   Sodium 137 135 - 145 mmol/L   Potassium 3.9 3.5 - 5.1 mmol/L   Chloride 98 98 - 111 mmol/L   CO2 26 22 - 32 mmol/L   Glucose, Bld 102 (H) 70 - 99 mg/dL   BUN 29 (H) 6 - 20 mg/dL   Creatinine, Ser 1.36 (H) 0.61 - 1.24 mg/dL   Calcium 9.2 8.9 - 10.3 mg/dL   Total Protein 7.7 6.5 - 8.1 g/dL   Albumin 3.3 (L) 3.5 - 5.0 g/dL   AST 71 (H) 15 - 41 U/L   ALT 32 0 - 44 U/L  Alkaline Phosphatase 128 (H) 38 - 126 U/L   Total Bilirubin 2.4 (H) 0.3 - 1.2 mg/dL   GFR calc non Af Amer >60 >60 mL/min   GFR calc Af Amer >60 >60 mL/min   Anion gap 13 5 - 15    Comment: Performed at [facility], [address] [place], [place] [address]  Brain natriuretic peptide     Status: Abnormal   Collection Time: 02/13/19 10:43 PM  Result Value Ref Range   B Natriuretic Peptide 3,902.9 (H) 0.0 - 100.0 pg/mL    Comment: Performed at [facility], [address] [place], [place] [address]  Digoxin level     Status: Abnormal   Collection Time: 02/13/19 10:43 PM  Result Value Ref Range   Digoxin Level 0.2 (L) 0.8 - 2.0 ng/mL    Comment: Performed at [facility], [address] [place], [place] [address]    Chemistries   Recent Labs  Lab 02/13/19 2243  NA 137  K 3.9  CL 98  CO2 26  GLUCOSE 102*  BUN 29*  CREATININE 1.36*  CALCIUM 9.2  AST 71*  ALT 32  ALKPHOS 128*  BILITOT 2.4*   ------------------------------------------------------------------------------------------------------------------  ------------------------------------------------------------------------------------------------------------------ GFR: CrCl cannot be calculated (Unknown ideal weight.). Liver Function Tests: Recent Labs  Lab 02/13/19 2243  AST 71*  ALT 32  ALKPHOS 128*  BILITOT 2.4*  PROT 7.7  ALBUMIN 3.3*   No results for input(s): LIPASE, AMYLASE in the last 168 hours. No results for input(s): AMMONIA in the last 168 hours. Coagulation Profile: No results for input(s): INR, PROTIME in the last 168 hours. Cardiac Enzymes: No results for input(s): CKTOTAL, CKMB, CKMBINDEX, TROPONINI in the last 168 hours. BNP (last 3 results) No results for input(s): PROBNP in the last 8760 hours. HbA1C: No results for input(s): HGBA1C in the last 72 hours. CBG: No results for input(s): GLUCAP in the last 168 hours. Lipid Profile: No results for input(s): CHOL, HDL, LDLCALC, TRIG, CHOLHDL, LDLDIRECT in the last 72 hours. Thyroid Function Tests: No results for input(s): TSH, T4TOTAL, FREET4, T3FREE, THYROIDAB in the last 72 hours. Anemia Panel: No results for input(s): VITAMINB12, FOLATE, FERRITIN, TIBC, IRON, RETICCTPCT in the last 72 hours.  --------------------------------------------------------------------------------------------------------------- Urine analysis:    Component Value Date/Time   COLORURINE STRAW (A) 01/24/2019 1955   APPEARANCEUR CLEAR 01/24/2019 1955   LABSPEC 1.005 01/24/2019 1955   PHURINE 5.0 01/24/2019 1955   GLUCOSEU NEGATIVE 01/24/2019 1955   HGBUR NEGATIVE 01/24/2019 1955   BILIRUBINUR NEGATIVE 01/24/2019 1955   BILIRUBINUR negative 12/01/2017 0910   KETONESUR NEGATIVE 01/24/2019 1955    PROTEINUR NEGATIVE 01/24/2019 1955   UROBILINOGEN 2.0 (A) 12/01/2017 0910   NITRITE NEGATIVE 01/24/2019 1955   LEUKOCYTESUR NEGATIVE 01/24/2019 1955      Imaging Results:    Dg Chest 2 View  Result Date: 02/13/2019 CLINICAL DATA:  Allergic reaction. EXAM: CHEST - 2 VIEW COMPARISON:  Chest x-ray dated January 30, 2019 FINDINGS: The heart is significantly enlarged. The lungs are hyperexpanded. There is no pneumothorax. A background of emphysematous changes is suspected bilaterally. There is no acute osseous abnormality. IMPRESSION: No active cardiopulmonary disease. Again noted is significant cardiomegaly. Electronically Signed   By: Katherine Mantlehristopher  Green M.D.   On: 02/13/2019 21:24     Assessment & Plan:    Principal Problem:   Acute on chronic systolic CHF (congestive heart failure) (HCC) Active Problems:   Tobacco abuse   Tachycardia   CKD (  chronic kidney disease) stage 2, GFR 60-89 ml/min   Chronic anemia   Allergic reaction caused by a drug   Abnormal liver function  Dyspnea  secondary to Acute on Chronic Systolic CHF precipitated by tachycardia secondary to Epi, and allerghic reaction to Abx Tele Trop I q4h x2, TSH Check cardiac echo Bipap  Start Lasix 80mg  iv qday Cardiology consult requested in computer, [facility]  Tachycardia Tele Trop I q4h x2 Monitor  Abnormal liver function Check acute hepatitis panel Check RUQ ultrasound Check cmp in am  Hyperlipidemia Cont Lipitor 40mg  po qday  Nonischemic cardiomyopathy (EF 10%) Cont Losartan 25mg  po qday Cont Digoxin 0.125mg  po qday  LVThrombus Coumadin pharmacy to dose  CKD stage2 Check cmp in am  Hx of Asthma/ Copd Cont Symbicort=> Dulera  DVT Prophylaxis-   Coumadin pharmacy to dose  AM Labs Ordered, also please review Full Orders  Family Communication: Admission, patients condition and plan of care including tests being ordered have been discussed with the patient  who indicate  understanding and agree with the plan and Code Status.  Code Status:  FULL CODE  Admission status: Inpatient: Based on patients clinical presentation and evaluation of above clinical data, I have made determination that patient meets Inpatient criteria at this time.  Pt has high risk of clinical deterioration. Pt has Acute on Chronic Systolic CHF and is requiring Bipap and iv lasix at this time. He will require > 2 nites stay to stabilize his condition due to EF 10%  Time spent in minutes : 70   Pearson GrippeJames Yakub Lodes M.D on 02/14/2019 at 1:54 AM

## 2019-02-14 NOTE — Progress Notes (Signed)
PROGRESS NOTE    Mark ScottJeremie C [name]  ZOX:[id number] DOB: 10/31/1975 DOA: 02/13/2019 PCP: Grayce SessionsEdwards, Michelle P, NP   Brief Narrative:  43 year old with history of systolic congestive heart failure, ejection fraction 10%, nonischemic cardiomyopathy, CVA, CKD stage II, tobacco use, medical noncompliance, illicit drug use came to the hospital with complains of progressive shortness of breath.  Found to be in fluid overload secondary to heart failure initially had to be placed on BiPAP.   Assessment & Plan:   Principal Problem:   Acute on chronic systolic CHF (congestive heart failure) (HCC) Active Problems:   Tobacco abuse   Tachycardia   CKD (chronic kidney disease) stage 2, GFR 60-89 ml/min   Chronic anemia   Acute CHF (congestive heart failure) (HCC)   Allergic reaction caused by a drug   Abnormal liver function  Acute respiratory distress requiring BiPAP Acute congestive heart failure with reduced ejection fraction, 10%, class IV -Currently patient is on BiPAP.  Continue diuretics at this point.  Daily weight, strict input and output - No need for repeat echocardiogram at this point. - Likely not a candidate for advanced cardiac therapy.  Continue medical management. -Cardiology team following. -Supportive care.  Wean him off BiPAP as deemed appropriate -We will monitor his renal function.  Apical thrombus - Currently patient is on Coumadin.  Pharmacy to dose.  Questionable facial swelling - States it could be from Bactrim.  Previously has developed angioedema secondary to ACE inhibitor/Entresto.  Continue losartan.  No obvious evidence of mucosal swelling.  This was treated with IV Benadryl and Pepcid in the ER.  Transaminitis -Suspect this is secondary to hepatic congestion from the heart failure.  Overnight provider ordered acute hepatitis panel right upper quadrant ultrasound.  We will follow-up and trend LFTs.  Hyperlipidemia -On statin. I dont see a reason to d/c at this  time. Monitor LFTs.   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  Counseled to quit using this.  Tobacco use -Advised to quit plan  COPD appears to be stable  DVT prophylaxis: Coumadin Code Status: Full  Family Communication: None at bedside Disposition Plan: Maintain hospital stay for aggressive IV diuresis.  Currently on BiPAP.  Consultants:   Cardiology  Procedures:   None  Antimicrobials:   None   Subjective: Patient remains on BiPAP this morning.  He still having shortness of breath even at rest.  Tolerating BiPAP well.  Review of Systems Otherwise negative except as per HPI, including: General: Denies fever, chills, night sweats or unintended weight loss. Resp: Denies cough, wheezing Cardiac: Denies chest pain, palpitations, orthopnea, paroxysmal nocturnal dyspnea. GI: Denies abdominal pain, nausea, vomiting, diarrhea or constipation GU: Denies dysuria, frequency, hesitancy or incontinence MS: Denies muscle aches, joint pain or swelling Neuro: Denies headache, neurologic deficits (focal weakness, numbness, tingling), abnormal gait Psych: Denies anxiety, depression, SI/HI/AVH Skin: Denies new rashes or lesions ID: Denies sick contacts, exotic exposures, travel  Objective: Vitals:   02/14/19 1000 02/14/19 1040 02/14/19 1045 02/14/19 1200  BP: (!) 114/92     Pulse: (!) 111     Resp: 20     Temp:    (!) 97.4 F (36.3 C)  TempSrc:    Oral  SpO2:   100%   Weight:      Height:  5\' 11"  (1.803 m)      Intake/Output Summary (Last 24 hours) at 02/14/2019 1301 Last data filed at 02/14/2019  1000 Gross per 24 hour  Intake 50 ml  Output 950 ml  Net -900 ml   Filed Weights   02/14/19 0411  Weight: 70.1 kg    Examination:  General exam: Appears calm and comfortable, on BiPAP Respiratory system: Bibasilar crackles Cardiovascular system: S1 & S2 heard, RRR. No JVD, murmurs, rubs,  gallops or clicks. No pedal edema. Gastrointestinal system: Abdomen is nondistended, soft and nontender. No organomegaly or masses felt. Normal bowel sounds heard. Central nervous system: Alert and oriented. No focal neurological deficits. Extremities: Symmetric 5 x 5 power. Skin: No rashes, lesions or ulcers Psychiatry: Judgement and insight appear normal. Mood & affect appropriate.     Data Reviewed:   CBC: Recent Labs  Lab 02/13/19 2243 02/14/19 0415  WBC 6.0 5.7  NEUTROABS 2.9  --   HGB 12.7* 12.2*  HCT 41.0 38.5*  MCV 83.5 82.4  PLT 211 160   Basic Metabolic Panel: Recent Labs  Lab 02/13/19 2243 02/14/19 0415  NA 137 136  K 3.9 3.6  CL 98 99  CO2 26 24  GLUCOSE 102* 100*  BUN 29* 28*  CREATININE 1.36* 1.17  1.25*  CALCIUM 9.2 9.2   GFR: Estimated Creatinine Clearance: 74.8 mL/min (A) (by C-G formula based on SCr of 1.25 mg/dL (H)). Liver Function Tests: Recent Labs  Lab 02/13/19 2243 02/14/19 0415  AST 71* 66*  ALT 32 34  ALKPHOS 128* 116  BILITOT 2.4* 3.0*  PROT 7.7 7.6  ALBUMIN 3.3* 3.4*   No results for input(s): LIPASE, AMYLASE in the last 168 hours. No results for input(s): AMMONIA in the last 168 hours. Coagulation Profile: Recent Labs  Lab 02/14/19 0140  INR 2.0*   Cardiac Enzymes: No results for input(s): CKTOTAL, CKMB, CKMBINDEX, TROPONINI in the last 168 hours. BNP (last 3 results) No results for input(s): PROBNP in the last 8760 hours. HbA1C: No results for input(s): HGBA1C in the last 72 hours. CBG: No results for input(s): GLUCAP in the last 168 hours. Lipid Profile: No results for input(s): CHOL, HDL, LDLCALC, TRIG, CHOLHDL, LDLDIRECT in the last 72 hours. Thyroid Function Tests: No results for input(s): TSH, T4TOTAL, FREET4, T3FREE, THYROIDAB in the last 72 hours. Anemia Panel: No results for input(s): VITAMINB12, FOLATE, FERRITIN, TIBC, IRON, RETICCTPCT in the last 72 hours. Sepsis Labs: No results for input(s):  PROCALCITON, LATICACIDVEN in the last 168 hours.  Recent Results (from the past 240 hour(s))  SARS Coronavirus 2 (CEPHEID - [facility]), Hosp Order     Status: None   Collection Time: 02/14/19  2:02 AM   Specimen: Nasopharyngeal Swab  Result Value Ref Range Status   SARS Coronavirus 2 NEGATIVE NEGATIVE Final    Comment: (NOTE) If result is NEGATIVE SARS-CoV-2 target nucleic acids are NOT DETECTED. The SARS-CoV-2 RNA is generally detectable in upper and lower  respiratory specimens during the acute phase of infection. The lowest  concentration of SARS-CoV-2 viral copies this assay can detect is 250  copies / mL. A negative result does not preclude SARS-CoV-2 infection  and should not be used as the sole basis for treatment or other  patient management decisions.  A negative result may occur with  improper specimen collection / handling, submission of specimen other  than nasopharyngeal swab, presence of viral mutation(s) within the  areas targeted by this assay, and inadequate number of viral copies  (<250 copies / mL). A negative result must be combined with clinical  observations, patient history, and  epidemiological information. If result is POSITIVE SARS-CoV-2 target nucleic acids are DETECTED. The SARS-CoV-2 RNA is generally detectable in upper and lower  respiratory specimens dur ing the acute phase of infection.  Positive  results are indicative of active infection with SARS-CoV-2.  Clinical  correlation with patient history and other diagnostic information is  necessary to determine patient infection status.  Positive results do  not rule out bacterial infection or co-infection with other viruses. If result is PRESUMPTIVE POSTIVE SARS-CoV-2 nucleic acids MAY BE PRESENT.   A presumptive positive result was obtained on the submitted specimen  and confirmed on repeat testing.  While 2019 novel coronavirus  (SARS-CoV-2) nucleic acids may be present in  the submitted sample  additional confirmatory testing may be necessary for epidemiological  and / or clinical management purposes  to differentiate between  SARS-CoV-2 and other Sarbecovirus currently known to infect humans.  If clinically indicated additional testing with an alternate test  methodology [phone]) is advised. The SARS-CoV-2 RNA is generally  detectable in upper and lower respiratory sp ecimens during the acute  phase of infection. The expected result is Negative. Fact Sheet for Patients:  StrictlyIdeas.no Fact Sheet for Healthcare Providers: BankingDealers.co.za This test is not yet approved or cleared by the [place] FDA and has been authorized for detection and/or diagnosis of SARS-CoV-2 by FDA under an Emergency Use Authorization (EUA).  This EUA will remain in effect (meaning this test can be used) for the duration of the COVID-19 declaration under Section 564(b)(1) of the Act, 21 U.S.C. section 360bbb-3(b)(1), unless the authorization is terminated or revoked sooner. Performed at [facility], [place] [address], [place], [place] [address]   MRSA PCR Screening     Status: None   Collection Time: 02/14/19  4:01 AM   Specimen: Nasopharyngeal  Result Value Ref Range Status   MRSA by PCR NEGATIVE NEGATIVE Final    Comment:        The GeneXpert MRSA Assay (FDA approved for NASAL specimens only), is one component of a comprehensive MRSA colonization surveillance program. It is not intended to diagnose MRSA infection nor to guide or monitor treatment for MRSA infections. Performed at [facility], [place] [address] [place], [place] [address]          Radiology Studies: Dg Chest 2 View  Result Date: 02/13/2019 CLINICAL DATA:  Allergic reaction. EXAM: CHEST - 2 VIEW COMPARISON:  Chest x-ray dated January 30, 2019 FINDINGS: The heart is significantly enlarged. The lungs are  hyperexpanded. There is no pneumothorax. A background of emphysematous changes is suspected bilaterally. There is no acute osseous abnormality. IMPRESSION: No active cardiopulmonary disease. Again noted is significant cardiomegaly. Electronically Signed   By: Constance Holster M.D.   On: 02/13/2019 21:24   [place] Abdomen Limited Ruq  Result Date: 02/14/2019 CLINICAL DATA:  Abnormal LFTs. EXAM: ULTRASOUND ABDOMEN LIMITED RIGHT UPPER QUADRANT COMPARISON:  None. FINDINGS: Gallbladder: There is diffuse gallbladder wall thickening with the gallbladder measuring approximately 14 mm in thickness. The sonographic Percell Miller sign is negative. There is pericholecystic free fluid. No gallstones are visualized. Common bile duct: Diameter: 2 mm Liver: The liver is heterogeneous. There is no discrete hepatic mass. There appears to be a somewhat nodular contour. There is a small amount of free fluid throughout the abdomen. Portal vein is patent on color Doppler imaging with normal direction of blood flow towards the liver. IMPRESSION: 1. Diffuse gallbladder wall thickening in the absence of gallstones or a positive  sonographic Murphy sign. Gallbladder wall thickening is nonspecific and can be seen in patients with ascites, heart failure, or hepatocellular dysfunction. 2. Small volume abdominal ascites. 3. Heterogeneous liver with a somewhat nodular contour is suspicious for cirrhosis. Electronically Signed   By: Katherine Mantle M.D.   On: 02/14/2019 03:42        Scheduled Meds: . aspirin EC  81 mg Oral Daily  . atorvastatin  40 mg Oral q1800  . chlorhexidine  15 mL Mouth Rinse BID  . [START ON 02/15/2019] Chlorhexidine Gluconate Cloth  6 each Topical Daily  . digoxin  0.125 mg Oral Daily  . ferrous sulfate  325 mg Oral Daily  . furosemide  80 mg Intravenous Daily  . losartan  25 mg Oral QHS  . mouth rinse  15 mL Mouth Rinse q12n4p  . mometasone-formoterol  2 puff Inhalation BID  . multivitamin with minerals  1  tablet Oral Daily  . nicotine  14 mg Transdermal Daily  . sodium chloride flush  3 mL Intravenous Q12H   Continuous Infusions: . sodium chloride       LOS: 0 days   Time spent= 35 mins    Matix Henshaw Joline Maxcy, MD [facility]  If 7PM-7AM, please contact night-coverage [email or web address] 02/14/2019, 1:01 PM

## 2019-02-15 ENCOUNTER — Inpatient Hospital Stay (HOSPITAL_COMMUNITY): Payer: Medicaid Other

## 2019-02-15 LAB — COMPREHENSIVE METABOLIC PANEL
ALT: 28 U/L (ref 0–44)
AST: 52 U/L — ABNORMAL HIGH (ref 15–41)
Albumin: 2.9 g/dL — ABNORMAL LOW (ref 3.5–5.0)
Alkaline Phosphatase: 104 U/L (ref 38–126)
Anion gap: 9 (ref 5–15)
BUN: 29 mg/dL — ABNORMAL HIGH (ref 6–20)
CO2: 28 mmol/L (ref 22–32)
Calcium: 8.8 mg/dL — ABNORMAL LOW (ref 8.9–10.3)
Chloride: 100 mmol/L (ref 98–111)
Creatinine, Ser: 1.2 mg/dL (ref 0.61–1.24)
GFR calc Af Amer: >60 mL/min (ref >60)
GFR calc non Af Amer: >60 mL/min (ref >60)
Glucose, Bld: 72 mg/dL (ref 70–99)
Potassium: 3.7 mmol/L (ref 3.5–5.1)
Sodium: 137 mmol/L (ref 135–145)
Total Bilirubin: 2.3 mg/dL — ABNORMAL HIGH (ref 0.3–1.2)
Total Protein: 6.8 g/dL (ref 6.5–8.1)

## 2019-02-15 LAB — CBC
HCT: 37.7 % — ABNORMAL LOW (ref 39.0–52.0)
Hemoglobin: 11.7 g/dL — ABNORMAL LOW (ref 13.0–17.0)
MCH: 25.2 pg — ABNORMAL LOW (ref 26.0–34.0)
MCHC: 31 g/dL (ref 30.0–36.0)
MCV: 81.1 fL (ref 80.0–100.0)
Platelets: 163 10*3/uL (ref 150–400)
RBC: 4.65 MIL/uL (ref 4.22–5.81)
RDW: 17.9 % — ABNORMAL HIGH (ref 11.5–15.5)
WBC: 5.2 10*3/uL (ref 4.0–10.5)
nRBC: 0 % (ref 0.0–0.2)

## 2019-02-15 LAB — HEPATITIS PANEL, ACUTE
HCV Ab: 0.2 s/co ratio (ref 0.0–0.9)
Hep A IgM: NEGATIVE
Hep B C IgM: NEGATIVE
Hepatitis B Surface Ag: NEGATIVE

## 2019-02-15 LAB — MAGNESIUM: Magnesium: 1.7 mg/dL (ref 1.7–2.4)

## 2019-02-15 LAB — PROTIME-INR
INR: 1.6 — ABNORMAL HIGH (ref 0.8–1.2)
Prothrombin Time: 18.9 seconds — ABNORMAL HIGH (ref 11.4–15.2)

## 2019-02-15 MED ORDER — LOSARTAN POTASSIUM 50 MG PO TABS: 50.0000 mg | ORAL_TABLET | Freq: Every day | ORAL | Status: DC

## 2019-02-15 MED ORDER — LOSARTAN POTASSIUM 25 MG PO TABS
25.0000 mg | ORAL_TABLET | Freq: Every day | ORAL | Status: DC
  Administered 2019-02-16 – 2019-02-18 (×3): 25 mg via ORAL
  Filled 2019-02-15 (×3): qty 1

## 2019-02-15 MED ORDER — MAGNESIUM OXIDE 400 (241.3 MG) MG PO TABS
800.0000 mg | ORAL_TABLET | ORAL | Status: AC
  Administered 2019-02-15 (×2): 800 mg via ORAL
  Filled 2019-02-15 (×2): qty 2

## 2019-02-15 MED ORDER — WARFARIN SODIUM 5 MG PO TABS
7.5000 mg | ORAL_TABLET | Freq: Once | ORAL | Status: AC
  Administered 2019-02-15: 7.5 mg via ORAL
  Filled 2019-02-15: qty 1

## 2019-02-15 MED ORDER — DIPHENHYDRAMINE HCL 25 MG PO CAPS
25.0000 mg | ORAL_CAPSULE | Freq: Three times a day (TID) | ORAL | Status: DC | PRN
  Administered 2019-02-15 – 2019-02-16 (×3): 25 mg via ORAL
  Filled 2019-02-15 (×3): qty 1

## 2019-02-15 MED ORDER — DIPHENHYDRAMINE HCL 25 MG PO CAPS
25.0000 mg | ORAL_CAPSULE | Freq: Once | ORAL | Status: AC
  Administered 2019-02-15: 25 mg via ORAL
  Filled 2019-02-15: qty 1

## 2019-02-15 MED ORDER — POTASSIUM CHLORIDE CRYS ER 20 MEQ PO TBCR
40.0000 meq | EXTENDED_RELEASE_TABLET | Freq: Once | ORAL | Status: AC
  Administered 2019-02-15: 40 meq via ORAL
  Filled 2019-02-15: qty 2

## 2019-02-15 MED ORDER — CLINDAMYCIN HCL 300 MG PO CAPS
300.0000 mg | ORAL_CAPSULE | Freq: Three times a day (TID) | ORAL | Status: DC
  Filled 2019-02-15: qty 1

## 2019-02-15 NOTE — Progress Notes (Signed)
Per RN, pt taken off bipap per his request.  Pt does not want to go back on bipap at this time due to nasal congestion and drainage.  Pt will alert RN/RT when ready to go back on bipap.  Bipap remains in room on standby.

## 2019-02-15 NOTE — Progress Notes (Addendum)
Progress Note  Patient Name: Mark [name] Date of Encounter: 02/15/2019  Primary Cardiologist:  Verne Carrow, MD  Subjective   Breathing much better than yesterday. No chest pain. Concerned about getting Medicaid and disability  Inpatient Medications    Scheduled Meds: . aspirin EC  81 mg Oral Daily  . atorvastatin  40 mg Oral q1800  . chlorhexidine  15 mL Mouth Rinse BID  . Chlorhexidine Gluconate Cloth  6 each Topical Daily  . digoxin  0.125 mg Oral Daily  . ferrous sulfate  325 mg Oral Daily  . furosemide  80 mg Intravenous BID  . losartan  25 mg Oral QHS  . magnesium oxide  800 mg Oral Q4H  . mouth rinse  15 mL Mouth Rinse q12n4p  . mometasone-formoterol  2 puff Inhalation BID  . multivitamin with minerals  1 tablet Oral Daily  . nicotine  14 mg Transdermal Daily  . potassium chloride  40 mEq Oral Once  . sodium chloride flush  3 mL Intravenous Q12H  . warfarin  5 mg Oral q1800  . Warfarin - Pharmacist Dosing Inpatient   Does not apply q1800   Continuous Infusions: . sodium chloride     PRN Meds: sodium chloride, albuterol, alum & mag hydroxide-simeth, hydrALAZINE, hydrocortisone, hydrocortisone cream, lip balm, loratadine, Muscle Rub, phenol, polyethylene glycol, polyvinyl alcohol, senna-docusate, sodium chloride, sodium chloride flush   Vital Signs    Vitals:   02/15/19 0000 02/15/19 0359 02/15/19 0400 02/15/19 0700  BP: 97/72     Pulse: 100 (!) 104 (!) 105 90  Resp: (!) 21 17 18 18   Temp: 97.7 F (36.5 C)  98 F (36.7 C)   TempSrc: Axillary  Oral   SpO2: 100% 100% 100% 97%  Weight:      Height:        Intake/Output Summary (Last 24 hours) at 02/15/2019 0753 Last data filed at 02/15/2019 0600 Gross per 24 hour  Intake 960 ml  Output 2385 ml  Net -1425 ml   Filed Weights   02/14/19 0411  Weight: 70.1 kg   Last Weight  Most recent update: 02/14/2019  4:12 AM   Weight  70.1 kg (154 lb 8.7 oz)           Weight change:     Telemetry    SR, PVCs, 4 bt run NSVT - Personally Reviewed  ECG    06/25, ST, HR 116, no sig morphology change from 06/10 - Personally Reviewed  Physical Exam   General: Well developed, well nourished, male appearing in no acute distress. Head: Normocephalic, atraumatic.  Neck: Supple without bruits, JVD 11 cm. Lungs:  Resp regular and unlabored, rales bases Heart: RRR, S1, S2, no S3, S4, or murmur; no rub. Abdomen: Soft, non-tender, non-distended with normoactive bowel sounds. No hepatomegaly. No rebound/guarding. No obvious abdominal masses. Extremities: No clubbing, cyanosis, trace edema. Distal pedal pulses are 2+ bilaterally. Neuro: Alert and oriented X 3. Moves all extremities spontaneously. Psych: Normal affect.  Labs    Hematology Recent Labs  Lab 02/13/19 2243 02/14/19 0415 02/15/19 0216  WBC 6.0 5.7 5.2  RBC 4.91 4.67 4.65  HGB 12.7* 12.2* 11.7*  HCT 41.0 38.5* 37.7*  MCV 83.5 82.4 81.1  MCH 25.9* 26.1 25.2*  MCHC 31.0 31.7 31.0  RDW 19.2* 18.7* 17.9*  PLT 211 160 163    Chemistry Recent Labs  Lab 02/13/19 2243 02/14/19 0415 02/15/19 0216  NA 137 136 137  K 3.9 3.6 3.7  CL 98 99 100  CO2 26 24 28   GLUCOSE 102* 100* 72  BUN 29* 28* 29*  CREATININE 1.36* 1.17  1.25* 1.20  CALCIUM 9.2 9.2 8.8*  PROT 7.7 7.6 6.8  ALBUMIN 3.3* 3.4* 2.9*  AST 71* 66* 52*  ALT 32 34 28  ALKPHOS 128* 116 104  BILITOT 2.4* 3.0* 2.3*  GFRNONAA >60 >60  >60 >60  GFRAA >60 >60  >60 >60  ANIONGAP 13 13 9      BNP Recent Labs  Lab 02/13/19 2243  BNP 3,902.9*    Lab Results  Component Value Date   INR 1.6 (H) 02/15/2019   INR 2.0 (H) 02/14/2019   INR 1.2 12/30/2018   Drugs of Abuse     Component Value Date/Time   LABOPIA NONE DETECTED 12/25/2018 0138   COCAINSCRNUR POSITIVE (A) 12/25/2018 0138   LABBENZ NONE DETECTED 12/25/2018 0138   AMPHETMU NONE DETECTED 12/25/2018 0138   THCU NONE DETECTED 12/25/2018 0138   LABBARB NONE DETECTED 12/25/2018 0138       Radiology    Dg Chest 2 View  Result Date: 02/13/2019 CLINICAL DATA:  Allergic reaction. EXAM: CHEST - 2 VIEW COMPARISON:  Chest x-ray dated January 30, 2019 FINDINGS: The heart is significantly enlarged. The lungs are hyperexpanded. There is no pneumothorax. A background of emphysematous changes is suspected bilaterally. There is no acute osseous abnormality. IMPRESSION: No active cardiopulmonary disease. Again noted is significant cardiomegaly. Electronically Signed   By: Katherine Mantlehristopher  Green M.D.   On: 02/13/2019 21:24   [place] Abdomen Limited Ruq  Result Date: 02/14/2019 CLINICAL DATA:  Abnormal LFTs. EXAM: ULTRASOUND ABDOMEN LIMITED RIGHT UPPER QUADRANT COMPARISON:  None. FINDINGS: Gallbladder: There is diffuse gallbladder wall thickening with the gallbladder measuring approximately 14 mm in thickness. The sonographic Eulah PontMurphy sign is negative. There is pericholecystic free fluid. No gallstones are visualized. Common bile duct: Diameter: 2 mm Liver: The liver is heterogeneous. There is no discrete hepatic mass. There appears to be a somewhat nodular contour. There is a small amount of free fluid throughout the abdomen. Portal vein is patent on color Doppler imaging with normal direction of blood flow towards the liver. IMPRESSION: 1. Diffuse gallbladder wall thickening in the absence of gallstones or a positive sonographic Murphy sign. Gallbladder wall thickening is nonspecific and can be seen in patients with ascites, heart failure, or hepatocellular dysfunction. 2. Small volume abdominal ascites. 3. Heterogeneous liver with a somewhat nodular contour is suspicious for cirrhosis. Electronically Signed   By: Katherine Mantlehristopher  Green M.D.   On: 02/14/2019 03:42     Cardiac Studies   ECHO:  12/25/2018  1. The right ventricle has severely reduced systolic function. The cavity was severely enlarged. There is no increase in right ventricular wall thickness.  2. Left atrial size was mildly dilated.  3. Right  atrial size was moderately dilated.  4. Severely thickened tricuspid valve leaflets.  5. The mitral valve is grossly normal. Mitral valve regurgitation is moderate to severe by color flow Doppler. The MR jet is posteriorly-directed.  6. The tricuspid valve is grossly normal.  7. The pulmonic valve was grossly normal. Pulmonic valve regurgitation is moderate is mild by color flow Doppler.   8. Left Ventricle: The left ventricle has severely reduced systolic function, wih an ejection fraction of 5-10%. The cavity size was normal. There is no increase in left ventricular wall thickness. Definity contrast agent was given IV to delineate the left ventricular endocardial borders.  SUMMARY   Moderate LV  dilatation and severe systolic dysfunction with LVEF 5-10% and diffuse hypokinesis. 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 Grade 2 diastolic dysfunction with severely elevated filling pressures.  Patient Profile     43 y.o. male w/ hx Chronic Systolic Heart Failure (EF 10-15%), NICM by cath in 2017, history of stroke 2019, CKD stage 2, asthma, polysubstance abuse, R-MCA CVA, and non-compliance was admitted 06/25 with acute on chronic CHF and possible allergic rxn to Bactrim.  Assessment & Plan    1. Acute on chronic CHF, LV & RV failure:  - continue diuresis - need daily wts - I/O net neg 2.5 L so far - continue Lasix 80 mg IV BID for now - wt 149 lbs on 05/10, possibly his dry wt  2. Apical thrombus - continue coumadin  3. ?allergy Bactrim - facial swelling not noticeable now  Otherwise, per IM Principal Problem:   Acute on chronic systolic CHF (congestive heart failure) (HCC) Active Problems:   Tobacco abuse   Tachycardia   CKD (chronic kidney disease) stage 2, GFR 60-89 ml/min   Chronic anemia   Acute CHF (congestive heart failure) (HCC)   Allergic reaction caused by a drug   Abnormal liver function    Jonetta Speak , PA-C 7:53 AM 02/15/2019  Pager: [phone]  History and all data above reviewed.  Patient examined.  I agree with the findings as above. No acute distress but very frail looking.  Thinks he might be breathing better. He is having discomfort with this tooth and his team is having difficulty finding an antibiotic he can take The patient exam reveals COR:RRR  ,  Lungs: Decreased breath sound  ,  Abd: Positive bowel sounds, no rebound no guarding, Ext Trace  .  All available labs, radiology testing, previous records reviewed. Agree with documented assessment and plan. Can continue current diuresis while he is in the hospital.  However, his prognosis is very poor long term as in my note yesterday.  I had this discussion with him about the need to stop substance abuse and that we would continue to try to help him with meds and home visits.  Unable to titrate meds.  He is now on warfarin and we can see if he can comply with follow up of this treatment.  This will foreshadow, whether he will likely   Minus Breeding  12:08 PM  02/15/2019

## 2019-02-15 NOTE — Progress Notes (Signed)
[place] for warfarin Indication: left ventricular thrombus  Allergies  Allergen Reactions  . Hydrocodone Hives  . Lisinopril Swelling and Other (See Comments)    Facial swelling/angioedema  . Prednisone Shortness Of Breath, Nausea Only, Swelling and Other (See Comments)    Also made chest feel tight and genital area, legs, and face became swollen badly  . Bactrim [Sulfamethoxazole-Trimethoprim]   . Penicillins Hives and Swelling     Has patient had a PCN reaction causing immediate rash, facial/tongue/throat swelling, SOB or lightheadedness with hypotension: Yes Has patient had a PCN reaction causing severe rash involving mucus membranes or skin necrosis: No Has patient had a PCN reaction that required hospitalization: No Has patient had a PCN reaction occurring within the last 10 years: No If all of the above answers are "NO", then may proceed with Cephalosporin use.        Vital Signs: Temp: 97.4 F (36.3 C) (06/26 0800) Temp Source: Oral (06/26 0800) BP: 136/85 (06/26 0900) Pulse Rate: 92 (06/26 0900)  Labs: Recent Labs    02/13/19 2243 02/14/19 0140 02/14/19 0415 02/15/19 0216  HGB 12.7*  --  12.2* 11.7*  HCT 41.0  --  38.5* 37.7*  PLT 211  --  160 163  LABPROT  --  22.5*  --  18.9*  INR  --  2.0*  --  1.6*  CREATININE 1.36*  --  1.17  1.25* 1.20    Estimated Creatinine Clearance: 77.9 mL/min (by C-G formula based on SCr of 1.2 mg/dL).   Medications:  Scheduled:  . aspirin EC  81 mg Oral Daily  . atorvastatin  40 mg Oral q1800  . chlorhexidine  15 mL Mouth Rinse BID  . Chlorhexidine Gluconate Cloth  6 each Topical Daily  . clindamycin  300 mg Oral Q8H  . digoxin  0.125 mg Oral Daily  . ferrous sulfate  325 mg Oral Daily  . furosemide  80 mg Intravenous BID  . losartan  25 mg Oral QHS  . magnesium oxide  800 mg Oral Q4H  . mouth rinse  15 mL Mouth Rinse q12n4p  . mometasone-formoterol  2 puff Inhalation BID  .  multivitamin with minerals  1 tablet Oral Daily  . nicotine  14 mg Transdermal Daily  . sodium chloride flush  3 mL Intravenous Q12H  . warfarin  5 mg Oral q1800  . Warfarin - Pharmacist Dosing Inpatient   Does not apply q1800   Infusions:  . sodium chloride      Assessment: 72 yoM admitted with alleged allergic rxn to bactrim. On PTA warfarin for left ventricular thrombus; pharmacy to continue while inpatient.   Baseline INR therapeutic  Prior anticoagulation: warfarin 5 mg daily, LD 6/24  Significant events:  Today, 02/15/2019:  CBC: Hgb slightly lower overnight but still > 10; Plt stable WNL  INR now subtherapeutic, questionable compliance PTA  Major drug interactions: none, also on ASA per cardiology  No bleeding issues per nursing  Diet ordered  Goal of Therapy: INR 2-3  Plan:  Warfarin 7.5 mg x 1 tonight  Daily INR  CBC at least q72 hr while on warfarin  Monitor for signs of bleeding or thrombosis   Reuel Boom, PharmD, BCPS [phone] 02/15/2019, 11:20 AM

## 2019-02-15 NOTE — Progress Notes (Signed)
PROGRESS NOTE    Mark [name]  JJO:[id number] DOB: 1976/03/23 DOA: 02/13/2019 PCP: Kerin Perna, NP   Brief Narrative:  43 year old with history of systolic congestive heart failure, ejection fraction 10%, nonischemic cardiomyopathy, CVA, CKD stage II, tobacco use, medical noncompliance, illicit drug use came to the hospital with complains of progressive shortness of breath.  Found to be in fluid overload secondary to heart failure initially had to be placed on BiPAP.   Assessment & Plan:   Principal Problem:   Acute on chronic systolic CHF (congestive heart failure) (HCC) Active Problems:   Tobacco abuse   Tachycardia   CKD (chronic kidney disease) stage 2, GFR 60-89 ml/min   Chronic anemia   Acute CHF (congestive heart failure) (HCC)   Allergic reaction caused by a drug   Abnormal liver function  Acute respiratory distress requiring BiPAP Acute congestive heart failure with reduced ejection fraction, 10%, class III -Off BiPAP this morning.  Continue diuretics at this point.  Daily weight, strict input and output - No need for repeat echocardiogram at this point. Monitor lytes.  - Likely not a candidate for advanced cardiac therapy.  Continue medical management. -Cardiology team following. -Supportive care.  Wean him off BiPAP as deemed appropriate -We will monitor his renal function.  Mandibular pain with poor dentition, concerning for Dental Abscess -I am concerned about deeper infection, dental abscess.   - X-ray mandible -Clindamycin D1  Apical thrombus - Currently patient is on Coumadin.  Pharmacy to dose.  Questionable facial swelling - States it could be from Bactrim.  Previously has developed angioedema secondary to ACE inhibitor/Entresto.  Continue losartan.  No obvious evidence of mucosal swelling.  This was treated with IV Benadryl and Pepcid in the ER.  Transaminitis, stable -Suspect this is secondary to hepatic congestion from the heart failure.   Overnight provider ordered acute hepatitis panel right upper quadrant ultrasound.  We will follow-up and trend LFTs.  Hyperlipidemia -On statin. I dont see a reason to d/c at this time. Monitor LFTs.   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  Counseled to quit using this.  Tobacco use -Advised to quit plan  COPD appears to be stable  DVT prophylaxis: Coumadin Code Status: Full  Family Communication: None at bedside Disposition Plan: Maintain hospital stay for IV diuresis.  Consultants:   Cardiology  Procedures:   None  Antimicrobials:   None   Subjective: Patient states his breathing is better.  He is able to stable off BiPAP on nasal cannula.  He also reports of his jaw pain due to suspicion of dental abscess outpatient he was on Bactrim which he had allergic reaction to.  He is also having some difficulty chewing his food due to pain.  Review of Systems Otherwise negative except as per HPI, including: General = no fevers, chills, dizziness, malaise, fatigue HEENT/EYES = negative for pain, redness, loss of vision, double vision, blurred vision, loss of hearing, sore throat, hoarseness, dysphagia Cardiovascular= negative for chest pain, palpitation, murmurs, lower extremity swelling Respiratory/lungs= negative , hemoptysis, wheezing, mucus production Gastrointestinal= negative for nausea, vomiting,, abdominal pain, melena, hematemesis Genitourinary= negative for Dysuria, Hematuria, Change in Urinary Frequency MSK = Negative for arthralgia, myalgias, Back Pain, Joint swelling  Neurology= Negative for headache, seizures, numbness, tingling  Psychiatry= Negative for anxiety, depression, suicidal and homocidal ideation Allergy/Immunology= Medication/Food allergy as listed  Skin= Negative for Rash, lesions, ulcers, itching  Objective: Vitals:   02/15/19 0400 02/15/19 0700 02/15/19 0800  02/15/19 0900  BP:    136/85  Pulse: (!) 105 90  92  Resp: 18 18  19   Temp: 98 F (36.7 C)  (!) 97.4 F (36.3 C)   TempSrc: Oral  Oral   SpO2: 100% 97%  100%  Weight:      Height:        Intake/Output Summary (Last 24 hours) at 02/15/2019 1026 Last data filed at 02/15/2019 1020 Gross per 24 hour  Intake 720 ml  Output 2610 ml  Net -1890 ml   Filed Weights   02/14/19 0411  Weight: 70.1 kg    Examination: Constitutional: NAD, calm, comfortable on 4 L nasal cannula Eyes: PERRL, lids and conjunctivae normal ENMT: Mucous membranes are moist. Posterior pharynx clear of any exudate or lesions.Normal dentition.  Very poor dentition with missing tooth in the front Neck: normal, supple, no masses, no thyromegaly Respiratory: Diminished breath sounds bilaterally Cardiovascular: Regular rate and rhythm, no murmurs / rubs / gallops. No extremity edema. 2+ pedal pulses. No carotid bruits.  Abdomen: no tenderness, no masses palpated. No hepatosplenomegaly. Bowel sounds positive.  Musculoskeletal: no clubbing / cyanosis. No joint deformity upper and lower extremities. Good ROM, no contractures. Normal muscle tone.  Skin: no rashes, lesions, ulcers. No induration Neurologic: CN 2-12 grossly intact. Sensation intact, DTR normal. Strength 5/5 in all 4.  Psychiatric: Normal judgment and insight. Alert and oriented x 3. Normal mood.    Data Reviewed:   CBC: Recent Labs  Lab 02/13/19 2243 02/14/19 0415 02/15/19 0216  WBC 6.0 5.7 5.2  NEUTROABS 2.9  --   --   HGB 12.7* 12.2* 11.7*  HCT 41.0 38.5* 37.7*  MCV 83.5 82.4 81.1  PLT 211 160 163   Basic Metabolic Panel: Recent Labs  Lab 02/13/19 2243 02/14/19 0415 02/15/19 0216  NA 137 136 137  K 3.9 3.6 3.7  CL 98 99 100  CO2 26 24 28   GLUCOSE 102* 100* 72  BUN 29* 28* 29*  CREATININE 1.36* 1.17   1.25* 1.20  CALCIUM 9.2 9.2 8.8*  MG  --   --  1.7   GFR: Estimated Creatinine Clearance: 77.9 mL/min (by C-G formula based on SCr  of 1.2 mg/dL). Liver Function Tests: Recent Labs  Lab 02/13/19 2243 02/14/19 0415 02/15/19 0216  AST 71* 66* 52*  ALT 32 34 28  ALKPHOS 128* 116 104  BILITOT 2.4* 3.0* 2.3*  PROT 7.7 7.6 6.8  ALBUMIN 3.3* 3.4* 2.9*   No results for input(s): LIPASE, AMYLASE in the last 168 hours. No results for input(s): AMMONIA in the last 168 hours. Coagulation Profile: Recent Labs  Lab 02/14/19 0140 02/15/19 0216  INR 2.0* 1.6*   Cardiac Enzymes: No results for input(s): CKTOTAL, CKMB, CKMBINDEX, TROPONINI in the last 168 hours. BNP (last 3 results) No results for input(s): PROBNP in the last 8760 hours. HbA1C: No results for input(s): HGBA1C in the last 72 hours. CBG: No results for input(s): GLUCAP in the last 168 hours. Lipid Profile: No results for input(s): CHOL, HDL, LDLCALC, TRIG, CHOLHDL, LDLDIRECT in the last 72 hours. Thyroid Function Tests: No results for input(s): TSH, T4TOTAL, FREET4, T3FREE, THYROIDAB in the last 72 hours. Anemia Panel: No results for input(s): VITAMINB12, FOLATE, FERRITIN, TIBC, IRON, RETICCTPCT in the last 72 hours. Sepsis Labs: No results for input(s): PROCALCITON, LATICACIDVEN in the last 168 hours.  Recent Results (from the past 240 hour(s))  SARS  Coronavirus 2 (CEPHEID - Performed in [facility] hospital lab), Hosp Order     Status: None   Collection Time: 02/14/19  2:02 AM   Specimen: Nasopharyngeal Swab  Result Value Ref Range Status   SARS Coronavirus 2 NEGATIVE NEGATIVE Final    Comment: (NOTE) If result is NEGATIVE SARS-CoV-2 target nucleic acids are NOT DETECTED. The SARS-CoV-2 RNA is generally detectable in upper and lower  respiratory specimens during the acute phase of infection. The lowest  concentration of SARS-CoV-2 viral copies this assay can detect is 250  copies / mL. A negative result does not preclude SARS-CoV-2 infection  and should not be used as the sole basis for treatment or other  patient management decisions.  A  negative result may occur with  improper specimen collection / handling, submission of specimen other  than nasopharyngeal swab, presence of viral mutation(s) within the  areas targeted by this assay, and inadequate number of viral copies  (<250 copies / mL). A negative result must be combined with clinical  observations, patient history, and epidemiological information. If result is POSITIVE SARS-CoV-2 target nucleic acids are DETECTED. The SARS-CoV-2 RNA is generally detectable in upper and lower  respiratory specimens dur ing the acute phase of infection.  Positive  results are indicative of active infection with SARS-CoV-2.  Clinical  correlation with patient history and other diagnostic information is  necessary to determine patient infection status.  Positive results do  not rule out bacterial infection or co-infection with other viruses. If result is PRESUMPTIVE POSTIVE SARS-CoV-2 nucleic acids MAY BE PRESENT.   A presumptive positive result was obtained on the submitted specimen  and confirmed on repeat testing.  While 2019 novel coronavirus  (SARS-CoV-2) nucleic acids may be present in the submitted sample  additional confirmatory testing may be necessary for epidemiological  and / or clinical management purposes  to differentiate between  SARS-CoV-2 and other Sarbecovirus currently known to infect humans.  If clinically indicated additional testing with an alternate test  methodology [phone]([id number]) is advised. The SARS-CoV-2 RNA is generally  detectable in upper and lower respiratory sp ecimens during the acute  phase of infection. The expected result is Negative. Fact Sheet for Patients:  BoilerBrush.com.cyhttps://www.fda.gov/media/136312/download Fact Sheet for Healthcare Providers: https://pope.com/https://www.fda.gov/media/136313/download This test is not yet approved or cleared by the [place] FDA and has been authorized for detection and/or diagnosis of SARS-CoV-2 by FDA under an Emergency Use  Authorization (EUA).  This EUA will remain in effect (meaning this test can be used) for the duration of the COVID-19 declaration under Section 564(b)(1) of the Act, 21 U.S.C. section 360bbb-3(b)(1), unless the authorization is terminated or revoked sooner. Performed at [facility], [address] [place], [place] [address]   MRSA PCR Screening     Status: None   Collection Time: 02/14/19  4:01 AM   Specimen: Nasopharyngeal  Result Value Ref Range Status   MRSA by PCR NEGATIVE NEGATIVE Final    Comment:        The GeneXpert MRSA Assay (FDA approved for NASAL specimens only), is one component of a comprehensive MRSA colonization surveillance program. It is not intended to diagnose MRSA infection nor to guide or monitor treatment for MRSA infections. Performed at [facility], [address] [place], [place] [address]          Radiology Studies: Dg Chest 2 View  Result Date: 02/13/2019 CLINICAL DATA:  Allergic reaction. EXAM: CHEST - 2 VIEW COMPARISON:  Chest  x-ray dated January 30, 2019 FINDINGS: The heart is significantly enlarged. The lungs are hyperexpanded. There is no pneumothorax. A background of emphysematous changes is suspected bilaterally. There is no acute osseous abnormality. IMPRESSION: No active cardiopulmonary disease. Again noted is significant cardiomegaly. Electronically Signed   By: Katherine Mantlehristopher  Green M.D.   On: 02/13/2019 21:24   [place] Abdomen Limited Ruq  Result Date: 02/14/2019 CLINICAL DATA:  Abnormal LFTs. EXAM: ULTRASOUND ABDOMEN LIMITED RIGHT UPPER QUADRANT COMPARISON:  None. FINDINGS: Gallbladder: There is diffuse gallbladder wall thickening with the gallbladder measuring approximately 14 mm in thickness. The sonographic Eulah PontMurphy sign is negative. There is pericholecystic free fluid. No gallstones are visualized. Common bile duct: Diameter: 2 mm Liver: The liver is heterogeneous. There is no discrete hepatic mass. There  appears to be a somewhat nodular contour. There is a small amount of free fluid throughout the abdomen. Portal vein is patent on color Doppler imaging with normal direction of blood flow towards the liver. IMPRESSION: 1. Diffuse gallbladder wall thickening in the absence of gallstones or a positive sonographic Murphy sign. Gallbladder wall thickening is nonspecific and can be seen in patients with ascites, heart failure, or hepatocellular dysfunction. 2. Small volume abdominal ascites. 3. Heterogeneous liver with a somewhat nodular contour is suspicious for cirrhosis. Electronically Signed   By: Katherine Mantlehristopher  Green M.D.   On: 02/14/2019 03:42        Scheduled Meds:  aspirin EC  81 mg Oral Daily   atorvastatin  40 mg Oral q1800   chlorhexidine  15 mL Mouth Rinse BID   Chlorhexidine Gluconate Cloth  6 each Topical Daily   digoxin  0.125 mg Oral Daily   ferrous sulfate  325 mg Oral Daily   furosemide  80 mg Intravenous BID   losartan  25 mg Oral QHS   magnesium oxide  800 mg Oral Q4H   mouth rinse  15 mL Mouth Rinse q12n4p   mometasone-formoterol  2 puff Inhalation BID   multivitamin with minerals  1 tablet Oral Daily   nicotine  14 mg Transdermal Daily   sodium chloride flush  3 mL Intravenous Q12H   warfarin  5 mg Oral q1800   Warfarin - Pharmacist Dosing Inpatient   Does not apply q1800   Continuous Infusions:  sodium chloride       LOS: 1 day   Time spent= 35 mins    Aveleen Nevers Joline Maxcyhirag Lexine Jaspers, MD [facility]  If 7PM-7AM, please contact night-coverage [email or web address] 02/15/2019, 10:26 AM

## 2019-02-16 DIAGNOSIS — N182 Chronic kidney disease, stage 2 (mild): Secondary | ICD-10-CM

## 2019-02-16 DIAGNOSIS — R000 Tachycardia, unspecified: Secondary | ICD-10-CM

## 2019-02-16 DIAGNOSIS — R945 Abnormal results of liver function studies: Secondary | ICD-10-CM

## 2019-02-16 DIAGNOSIS — I5041 Acute combined systolic (congestive) and diastolic (congestive) heart failure: Secondary | ICD-10-CM

## 2019-02-16 DIAGNOSIS — Z720 Tobacco use: Secondary | ICD-10-CM

## 2019-02-16 LAB — COMPREHENSIVE METABOLIC PANEL
ALT: 27 U/L (ref 0–44)
AST: 52 U/L — ABNORMAL HIGH (ref 15–41)
Albumin: 2.8 g/dL — ABNORMAL LOW (ref 3.5–5.0)
Alkaline Phosphatase: 105 U/L (ref 38–126)
Anion gap: 10 (ref 5–15)
BUN: 29 mg/dL — ABNORMAL HIGH (ref 6–20)
CO2: 29 mmol/L (ref 22–32)
Calcium: 8.5 mg/dL — ABNORMAL LOW (ref 8.9–10.3)
Chloride: 98 mmol/L (ref 98–111)
Creatinine, Ser: 1.18 mg/dL (ref 0.61–1.24)
GFR calc Af Amer: >60 mL/min (ref >60)
GFR calc non Af Amer: >60 mL/min (ref >60)
Glucose, Bld: 109 mg/dL — ABNORMAL HIGH (ref 70–99)
Potassium: 4 mmol/L (ref 3.5–5.1)
Sodium: 137 mmol/L (ref 135–145)
Total Bilirubin: 1.2 mg/dL (ref 0.3–1.2)
Total Protein: 6.6 g/dL (ref 6.5–8.1)

## 2019-02-16 LAB — CBC
HCT: 36.4 % — ABNORMAL LOW (ref 39.0–52.0)
Hemoglobin: 11.5 g/dL — ABNORMAL LOW (ref 13.0–17.0)
MCH: 25.6 pg — ABNORMAL LOW (ref 26.0–34.0)
MCHC: 31.6 g/dL (ref 30.0–36.0)
MCV: 81.1 fL (ref 80.0–100.0)
Platelets: 169 10*3/uL (ref 150–400)
RBC: 4.49 MIL/uL (ref 4.22–5.81)
RDW: 17.7 % — ABNORMAL HIGH (ref 11.5–15.5)
WBC: 5.8 10*3/uL (ref 4.0–10.5)
nRBC: 0.3 % — ABNORMAL HIGH (ref 0.0–0.2)

## 2019-02-16 LAB — PROTIME-INR
INR: 2 — ABNORMAL HIGH (ref 0.8–1.2)
Prothrombin Time: 22.1 seconds — ABNORMAL HIGH (ref 11.4–15.2)

## 2019-02-16 LAB — MAGNESIUM: Magnesium: 1.4 mg/dL — ABNORMAL LOW (ref 1.7–2.4)

## 2019-02-16 MED ORDER — LEVOFLOXACIN 500 MG PO TABS
500.0000 mg | ORAL_TABLET | Freq: Every day | ORAL | Status: DC
  Administered 2019-02-16 – 2019-02-18 (×2): 500 mg via ORAL
  Filled 2019-02-16 (×2): qty 1

## 2019-02-16 MED ORDER — WARFARIN SODIUM 4 MG PO TABS
4.0000 mg | ORAL_TABLET | Freq: Once | ORAL | Status: AC
  Administered 2019-02-16: 4 mg via ORAL
  Filled 2019-02-16: qty 1

## 2019-02-16 MED ORDER — WARFARIN SODIUM 5 MG PO TABS: 5.0000 mg | ORAL_TABLET | Freq: Once | ORAL | Status: DC

## 2019-02-16 MED ORDER — METRONIDAZOLE 500 MG PO TABS
500.0000 mg | ORAL_TABLET | Freq: Three times a day (TID) | ORAL | Status: DC
  Administered 2019-02-16 – 2019-02-18 (×3): 500 mg via ORAL
  Filled 2019-02-16 (×6): qty 1

## 2019-02-16 MED ORDER — DIPHENHYDRAMINE HCL 25 MG PO CAPS
25.0000 mg | ORAL_CAPSULE | Freq: Three times a day (TID) | ORAL | Status: DC | PRN
  Administered 2019-02-17 (×2): 25 mg via ORAL
  Filled 2019-02-16 (×2): qty 1

## 2019-02-16 MED ORDER — MAGNESIUM SULFATE 2 GM/50ML IV SOLN
2.0000 g | Freq: Once | INTRAVENOUS | Status: AC
  Administered 2019-02-16: 2 g via INTRAVENOUS
  Filled 2019-02-16: qty 50

## 2019-02-16 NOTE — Progress Notes (Addendum)
[place] for warfarin Indication: left ventricular thrombus  Allergies  Allergen Reactions  . Hydrocodone Hives  . Lisinopril Swelling and Other (See Comments)    Facial swelling/angioedema  . Prednisone Shortness Of Breath, Nausea Only, Swelling and Other (See Comments)    Also made chest feel tight and genital area, legs, and face became swollen badly  . Bactrim [Sulfamethoxazole-Trimethoprim]   . Clindamycin/Lincomycin     "it swells me up and makes my heart beat fast"  . Penicillins Hives and Swelling     Has patient had a PCN reaction causing immediate rash, facial/tongue/throat swelling, SOB or lightheadedness with hypotension: Yes Has patient had a PCN reaction causing severe rash involving mucus membranes or skin necrosis: No Has patient had a PCN reaction that required hospitalization: No Has patient had a PCN reaction occurring within the last 10 years: No If all of the above answers are "NO", then may proceed with Cephalosporin use.     Vital Signs: Temp: 97.5 F (36.4 C) (06/27 0800) Temp Source: Oral (06/27 0800) BP: 98/83 (06/27 0800) Pulse Rate: 95 (06/27 0800)  Labs: Recent Labs    02/14/19 0140 02/14/19 0415 02/15/19 0216 02/16/19 0225  HGB  --  12.2* 11.7* 11.5*  HCT  --  38.5* 37.7* 36.4*  PLT  --  160 163 169  LABPROT 22.5*  --  18.9* 22.1*  INR 2.0*  --  1.6* 2.0*  CREATININE  --  1.17  1.25* 1.20 1.18    Estimated Creatinine Clearance: 77.2 mL/min (by C-G formula based on SCr of 1.18 mg/dL).   Medications:  Scheduled:  . aspirin EC  81 mg Oral Daily  . atorvastatin  40 mg Oral q1800  . chlorhexidine  15 mL Mouth Rinse BID  . Chlorhexidine Gluconate Cloth  6 each Topical Daily  . digoxin  0.125 mg Oral Daily  . ferrous sulfate  325 mg Oral Daily  . furosemide  80 mg Intravenous BID  . losartan  25 mg Oral QHS  . mouth rinse  15 mL Mouth Rinse q12n4p  . mometasone-formoterol  2 puff Inhalation BID   . multivitamin with minerals  1 tablet Oral Daily  . nicotine  14 mg Transdermal Daily  . sodium chloride flush  3 mL Intravenous Q12H  . Warfarin - Pharmacist Dosing Inpatient   Does not apply q1800   Infusions:  . sodium chloride      Assessment: 64 yoM admitted with alleged allergic rxn to bactrim. On PTA warfarin for left ventricular thrombus; pharmacy to continue while inpatient.   Baseline INR therapeutic  Prior anticoagulation: warfarin 5 mg daily, LD 6/24  Significant events:  Today, 02/16/2019:  CBC: Hgb stable overnight; Plt stable WNL  INR improved to therapeutic range after boosted dose x1  Major drug interactions: continues on ASA; starting Levaquin/Flagyl today  No bleeding issues per nursing  Diet ordered  Goal of Therapy: INR 2-3  Plan:  Warfarin 4 mg x 1 tonight; expect Flagyl/Levaquin will increase INR but INR trend not fully clear at this point so will not reduce warfarin dose further just yet.  Daily INR  CBC at least q72 hr while on warfarin  Monitor for signs of bleeding or thrombosis   Reuel Boom, PharmD, BCPS [phone] 02/16/2019, 10:26 AM

## 2019-02-16 NOTE — Progress Notes (Signed)
Progress Note  Patient Name: Mark [name] Date of Encounter: 02/16/2019  Primary Cardiologist:  Verne Carrow, MD  Subjective   He is feeling slightly better today, improved breathing, he hasn't walked yet.  Inpatient Medications    Scheduled Meds: . aspirin EC  81 mg Oral Daily  . atorvastatin  40 mg Oral q1800  . chlorhexidine  15 mL Mouth Rinse BID  . Chlorhexidine Gluconate Cloth  6 each Topical Daily  . digoxin  0.125 mg Oral Daily  . ferrous sulfate  325 mg Oral Daily  . furosemide  80 mg Intravenous BID  . losartan  25 mg Oral QHS  . mouth rinse  15 mL Mouth Rinse q12n4p  . mometasone-formoterol  2 puff Inhalation BID  . multivitamin with minerals  1 tablet Oral Daily  . nicotine  14 mg Transdermal Daily  . sodium chloride flush  3 mL Intravenous Q12H  . Warfarin - Pharmacist Dosing Inpatient   Does not apply q1800   Continuous Infusions: . sodium chloride    . magnesium sulfate bolus IVPB     PRN Meds: sodium chloride, albuterol, alum & mag hydroxide-simeth, diphenhydrAMINE, hydrALAZINE, hydrocortisone, hydrocortisone cream, lip balm, loratadine, Muscle Rub, phenol, polyethylene glycol, polyvinyl alcohol, senna-docusate, sodium chloride, sodium chloride flush   Vital Signs    Vitals:   02/15/19 2200 02/16/19 0000 02/16/19 0500 02/16/19 0742  BP: 103/82     Pulse: 94     Resp: (!) 21     Temp:  97.8 F (36.6 C)    TempSrc:  Axillary    SpO2: 100%   100%  Weight:   68.3 kg   Height:        Intake/Output Summary (Last 24 hours) at 02/16/2019 0843 Last data filed at 02/16/2019 0555 Gross per 24 hour  Intake -  Output 775 ml  Net -775 ml   Filed Weights   02/14/19 0411 02/16/19 0500  Weight: 70.1 kg 68.3 kg   Last Weight  Most recent update: 02/16/2019  5:56 AM   Weight  68.3 kg (150 lb 9.2 oz)           Weight change:    Telemetry    SR, PVCs, 4 bt run NSVT - Personally Reviewed  ECG    06/25, ST, HR 116, no sig morphology  change from 06/10 - Personally Reviewed  Physical Exam   General: Well developed, well nourished, male appearing in no acute distress. Head: Normocephalic, atraumatic.  Neck: Supple without bruits, JVD up to jaws. Lungs:  Resp regular and unlabored, rales bases Heart: RRR, S1, S2, no S3, S4, or murmur; no rub. Abdomen: Soft, non-tender, non-distended with normoactive bowel sounds. No hepatomegaly. No rebound/guarding. No obvious abdominal masses. Extremities: No clubbing, cyanosis, trace edema. Distal pedal pulses are 2+ bilaterally. Neuro: Alert and oriented X 3. Moves all extremities spontaneously. Psych: Normal affect.  Labs    Hematology Recent Labs  Lab 02/14/19 0415 02/15/19 0216 02/16/19 0225  WBC 5.7 5.2 5.8  RBC 4.67 4.65 4.49  HGB 12.2* 11.7* 11.5*  HCT 38.5* 37.7* 36.4*  MCV 82.4 81.1 81.1  MCH 26.1 25.2* 25.6*  MCHC 31.7 31.0 31.6  RDW 18.7* 17.9* 17.7*  PLT 160 163 169    Chemistry Recent Labs  Lab 02/14/19 0415 02/15/19 0216 02/16/19 0225  NA 136 137 137  K 3.6 3.7 4.0  CL 99 100 98  CO2 24 28 29   GLUCOSE 100* 72 109*  BUN 28* 29*  29*  CREATININE 1.17  1.25* 1.20 1.18  CALCIUM 9.2 8.8* 8.5*  PROT 7.6 6.8 6.6  ALBUMIN 3.4* 2.9* 2.8*  AST 66* 52* 52*  ALT 34 28 27  ALKPHOS 116 104 105  BILITOT 3.0* 2.3* 1.2  GFRNONAA >60  >60 >60 >60  GFRAA >60  >60 >60 >60  ANIONGAP 13 9 10      BNP Recent Labs  Lab 02/13/19 2243  BNP 3,902.9*    Lab Results  Component Value Date   INR 2.0 (H) 02/16/2019   INR 1.6 (H) 02/15/2019   INR 2.0 (H) 02/14/2019   Drugs of Abuse     Component Value Date/Time   LABOPIA NONE DETECTED 12/25/2018 0138   COCAINSCRNUR POSITIVE (A) 12/25/2018 0138   LABBENZ NONE DETECTED 12/25/2018 0138   AMPHETMU NONE DETECTED 12/25/2018 0138   THCU NONE DETECTED 12/25/2018 0138   LABBARB NONE DETECTED 12/25/2018 0138      Radiology    Dg Mandible 1-3 Views  Result Date: 02/15/2019 CLINICAL DATA:  Mandible pain.  EXAM: MANDIBLE - 1-3 VIEW COMPARISON:  CT scan of the maxillofacial structures dated 12/26/2018 FINDINGS: The mandible appears normal including the temporomandibular joints. There multiple missing teeth. Metallic restorations are noted. IMPRESSION: No significant abnormality of the mandible. Electronically Signed   By: Francene BoyersJames  Maxwell M.D.   On: 02/15/2019 16:55   Dg Chest 2 View  Result Date: 02/13/2019 CLINICAL DATA:  Allergic reaction. EXAM: CHEST - 2 VIEW COMPARISON:  Chest x-ray dated January 30, 2019 FINDINGS: The heart is significantly enlarged. The lungs are hyperexpanded. There is no pneumothorax. A background of emphysematous changes is suspected bilaterally. There is no acute osseous abnormality. IMPRESSION: No active cardiopulmonary disease. Again noted is significant cardiomegaly. Electronically Signed   By: Katherine Mantlehristopher  Green M.D.   On: 02/13/2019 21:24   [place] Abdomen Limited Ruq  Result Date: 02/14/2019 CLINICAL DATA:  Abnormal LFTs. EXAM: ULTRASOUND ABDOMEN LIMITED RIGHT UPPER QUADRANT COMPARISON:  None. FINDINGS: Gallbladder: There is diffuse gallbladder wall thickening with the gallbladder measuring approximately 14 mm in thickness. The sonographic Eulah PontMurphy sign is negative. There is pericholecystic free fluid. No gallstones are visualized. Common bile duct: Diameter: 2 mm Liver: The liver is heterogeneous. There is no discrete hepatic mass. There appears to be a somewhat nodular contour. There is a small amount of free fluid throughout the abdomen. Portal vein is patent on color Doppler imaging with normal direction of blood flow towards the liver. IMPRESSION: 1. Diffuse gallbladder wall thickening in the absence of gallstones or a positive sonographic Murphy sign. Gallbladder wall thickening is nonspecific and can be seen in patients with ascites, heart failure, or hepatocellular dysfunction. 2. Small volume abdominal ascites. 3. Heterogeneous liver with a somewhat nodular contour is suspicious  for cirrhosis. Electronically Signed   By: Katherine Mantlehristopher  Green M.D.   On: 02/14/2019 03:42     Cardiac Studies   ECHO:  12/25/2018  1. The right ventricle has severely reduced systolic function. The cavity was severely enlarged. There is no increase in right ventricular wall thickness.  2. Left atrial size was mildly dilated.  3. Right atrial size was moderately dilated.  4. Severely thickened tricuspid valve leaflets.  5. The mitral valve is grossly normal. Mitral valve regurgitation is moderate to severe by color flow Doppler. The MR jet is posteriorly-directed.  6. The tricuspid valve is grossly normal.  7. The pulmonic valve was grossly normal. Pulmonic valve regurgitation is moderate is mild by color flow Doppler.  8. Left Ventricle: The left ventricle has severely reduced systolic function, wih an ejection fraction of 5-10%. The cavity size was normal. There is no increase in left ventricular wall thickness. Definity contrast agent was given IV to delineate the left ventricular endocardial borders.  SUMMARY   Moderate LV dilatation and severe systolic dysfunction with LVEF 5-10% and diffuse hypokinesis. 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 Grade 2 diastolic dysfunction with severely elevated filling pressures.  Patient Profile     43 y.o. male w/ hx Chronic Systolic Heart Failure (EF 10-15%), NICM by cath in 2017, history of stroke 2019, CKD stage 2, asthma, polysubstance abuse, R-MCA CVA, and non-compliance was admitted 06/25 with acute on chronic CHF and possible allergic rxn to Bactrim.  Assessment & Plan    Principal Problem:   Acute on chronic systolic CHF (congestive heart failure) (HCC) Active Problems:   Tobacco abuse   Tachycardia   CKD (chronic kidney disease) stage 2, GFR 60-89 ml/min   Chronic anemia   Acute CHF (congestive heart failure) (HCC)   Allergic reaction caused by a drug   Abnormal liver function  1. Acute on chronic CHF, LV  & RV failure:  - continue iv diuresis till tomorrow - BP slightly improved - need daily wts - I/O net neg 3.2 L so far, now 68 kg, baseline unknown, a year ago 160 lbs, 149 lbs on 05/10 - prognosis is very poor long term, need to stop substance abuse and that we would continue to try to help him with meds and home visits.  Unable to titrate meds, would continue digoxin, low dose losartan, would not start BB in the settings of acute CHF.  He is now on warfarin and we can see if he can comply with follow up of this treatment.    2. Apical thrombus - continue coumadin  3. ?allergy Bactrim - facial swelling not noticeable now   Ena Dawley  8:43 AM  02/16/2019

## 2019-02-16 NOTE — Progress Notes (Signed)
Pt arrive to unit. Condition stable. No changes in initial am assessment. Tel applied and CM called. NT to be second verifier.Cont with paln of care

## 2019-02-16 NOTE — Progress Notes (Signed)
PROGRESS NOTE    Mark [name]  JQB:[id number] DOB: 1976/07/20 DOA: 02/13/2019 PCP: Kerin Perna, NP   Brief Narrative:  43 year old with history of systolic congestive heart failure, ejection fraction 10%, nonischemic cardiomyopathy, CVA, CKD stage II, tobacco use, medical noncompliance, illicit drug use came to the hospital with complains of progressive shortness of breath.  Found to be in fluid overload secondary to heart failure initially had to be placed on BiPAP.  He was diuresed well with IV Lasix during the hospitalization and cardiology team was following.  During the hospitalization he also reported of dental pain which he has been dealing with for the past several weeks and was diagnosed with dental abscess.  He has been given a round of clindamycin and Bactrim but he states he is allergic to it.   Assessment & Plan:   Principal Problem:   Acute on chronic systolic CHF (congestive heart failure) (HCC) Active Problems:   Tobacco abuse   Tachycardia   CKD (chronic kidney disease) stage 2, GFR 60-89 ml/min   Chronic anemia   Acute CHF (congestive heart failure) (HCC)   Allergic reaction caused by a drug   Abnormal liver function  Acute respiratory distress requiring BiPAP Acute congestive heart failure with reduced ejection fraction, 10%, class III -Continue IV Lasix daily weight, strict input and output - No need for repeat echocardiogram.  Repeat and replete electrolytes as appropriate - Likely not a candidate for advanced cardiac therapy.  Continue medical management. -Cardiology team following. -Supportive care.  Wean him off BiPAP as deemed appropriate -We will monitor his renal function.  Mandibular pain with poor dentition, concerning for Dental Abscess -X-rays negative for obvious infection but does have poor dentition needs to follow-up outpatient with dentist. - Given his allergy to penicillin, clindamycin and Bactrim.  Will use Levaquin and Flagyl.   Apical thrombus - Currently patient is on Coumadin.  Pharmacy to dose.  Questionable facial swelling - States it could be from Bactrim.  Previously has developed angioedema secondary to ACE inhibitor/Entresto.  Continue losartan.  No obvious evidence of mucosal swelling.  This was treated with IV Benadryl and Pepcid in the ER.  Transaminitis, stable -Suspect this is secondary to hepatic congestion from the heart failure.  Overnight provider ordered acute hepatitis panel right upper quadrant ultrasound.  We will follow-up and trend LFTs.  Hyperlipidemia -On statin. I dont see a reason to d/c at this time. Monitor LFTs.   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  Counseled to quit using this.  Tobacco use -Advised to quit plan  COPD appears to be stable  DVT prophylaxis: Coumadin Code Status: Full  Family Communication: None at bedside Disposition Plan: Transfer to telemetry.  Maintain in hospital stay for IV diuresis  Consultants:   Cardiology  Procedures:   None  Antimicrobials:   None   Subjective: Patient has been off and on BiPAP overnight.  This morning feels a little better.  Still reports of dental pain with eating but no swelling noted.  Review of Systems Otherwise negative except as per HPI, including: General = no fevers, chills, dizziness, malaise, fatigue HEENT/EYES = negative for pain, redness, loss of vision, double vision, blurred vision, loss of hearing, sore throat, hoarseness, dysphagia Cardiovascular= negative for chest pain, palpitation, murmurs,  Respiratory/lungs= negative for  cough, hemoptysis, wheezing, mucus production Gastrointestinal= negative for nausea, vomiting,, abdominal pain, melena, hematemesis Genitourinary= negative  for Dysuria, Hematuria, Change in Urinary Frequency MSK = Negative for arthralgia, myalgias, Back Pain, Joint swelling  Neurology=  Negative for headache, seizures, numbness, tingling  Psychiatry= Negative for anxiety, depression, suicidal and homocidal ideation Allergy/Immunology= Medication/Food allergy as listed  Skin= Negative for Rash, lesions, ulcers, itching   Objective: Vitals:   02/16/19 0014 02/16/19 0500 02/16/19 0742 02/16/19 0800  BP: 112/79 (!) 114/55  98/83  Pulse: (!) 102 99  95  Resp: 20 (!) 26  12  Temp:    (!) 97.5 F (36.4 C)  TempSrc:    Oral  SpO2: 95% 100% 100% 100%  Weight:  68.3 kg    Height:        Intake/Output Summary (Last 24 hours) at 02/16/2019 1046 Last data filed at 02/16/2019 1018 Gross per 24 hour  Intake 453 ml  Output 1300 ml  Net -847 ml   Filed Weights   02/14/19 0411 02/16/19 0500  Weight: 70.1 kg 68.3 kg    Examination: Constitutional: NAD, calm, comfortable, 3 L nasal cannula Eyes: PERRL, lids and conjunctivae normal ENMT: Mucous membranes are moist. Posterior pharynx clear of any exudate or lesions.poor dentition with missing teeth Neck: normal, supple, no masses, no thyromegaly Respiratory: c bibasilar crackles Cardiovascular: Regular rate and rhythm, no murmurs / rubs / gallops. No extremity edema. 2+ pedal pulses. No carotid bruits.  Abdomen: no tenderness, no masses palpated. No hepatosplenomegaly. Bowel sounds positive.  Musculoskeletal: no clubbing / cyanosis. No joint deformity upper and lower extremities. Good ROM, no contractures. Normal muscle tone.  Skin: no rashes, lesions, ulcers. No induration Neurologic: CN 2-12 grossly intact. Sensation intact, DTR normal. Strength 5/5 in all 4.  Psychiatric: Normal judgment and insight. Alert and oriented x 3. Normal mood.  Alert and oriented x 3. Normal mood.    Data Reviewed:   CBC: Recent Labs  Lab 02/13/19 2243 02/14/19 0415 02/15/19 0216 02/16/19 0225  WBC 6.0 5.7 5.2 5.8  NEUTROABS 2.9  --   --   --   HGB 12.7* 12.2* 11.7* 11.5*  HCT 41.0 38.5* 37.7* 36.4*  MCV 83.5 82.4 81.1 81.1  PLT 211  160 163 169   Basic Metabolic Panel: Recent Labs  Lab 02/13/19 2243 02/14/19 0415 02/15/19 0216 02/16/19 0225  NA 137 136 137 137  K 3.9 3.6 3.7 4.0  CL 98 99 100 98  CO2 26 24 28 29   GLUCOSE 102* 100* 72 109*  BUN 29* 28* 29* 29*  CREATININE 1.36* 1.17  1.25* 1.20 1.18  CALCIUM 9.2 9.2 8.8* 8.5*  MG  --   --  1.7 1.4*   GFR: Estimated Creatinine Clearance: 77.2 mL/min (by C-G formula based on SCr of 1.18 mg/dL). Liver Function Tests: Recent Labs  Lab 02/13/19 2243 02/14/19 0415 02/15/19 0216 02/16/19 0225  AST 71* 66* 52* 52*  ALT 32 34 28 27  ALKPHOS 128* 116 104 105  BILITOT 2.4* 3.0* 2.3* 1.2  PROT 7.7 7.6 6.8 6.6  ALBUMIN 3.3* 3.4* 2.9* 2.8*   No results for input(s): LIPASE, AMYLASE in the last 168 hours. No results for input(s): AMMONIA in the last 168 hours. Coagulation Profile: Recent Labs  Lab 02/14/19 0140 02/15/19 0216 02/16/19 0225  INR 2.0* 1.6* 2.0*   Cardiac Enzymes: No results for input(s): CKTOTAL, CKMB, CKMBINDEX, TROPONINI in the last 168 hours. BNP (last 3 results) No results for input(s): PROBNP in the last 8760 hours. HbA1C: No results for input(s): HGBA1C in the last 72 hours. CBG:  No results for input(s): GLUCAP in the last 168 hours. Lipid Profile: No results for input(s): CHOL, HDL, LDLCALC, TRIG, CHOLHDL, LDLDIRECT in the last 72 hours. Thyroid Function Tests: No results for input(s): TSH, T4TOTAL, FREET4, T3FREE, THYROIDAB in the last 72 hours. Anemia Panel: No results for input(s): VITAMINB12, FOLATE, FERRITIN, TIBC, IRON, RETICCTPCT in the last 72 hours. Sepsis Labs: No results for input(s): PROCALCITON, LATICACIDVEN in the last 168 hours.  Recent Results (from the past 240 hour(s))  SARS Coronavirus 2 (CEPHEID - Performed in [facility] hospital lab), Hosp Order     Status: None   Collection Time: 02/14/19  2:02 AM   Specimen: Nasopharyngeal Swab  Result Value Ref Range Status   SARS Coronavirus 2 NEGATIVE NEGATIVE  Final    Comment: (NOTE) If result is NEGATIVE SARS-CoV-2 target nucleic acids are NOT DETECTED. The SARS-CoV-2 RNA is generally detectable in upper and lower  respiratory specimens during the acute phase of infection. The lowest  concentration of SARS-CoV-2 viral copies this assay can detect is 250  copies / mL. A negative result does not preclude SARS-CoV-2 infection  and should not be used as the sole basis for treatment or other  patient management decisions.  A negative result may occur with  improper specimen collection / handling, submission of specimen other  than nasopharyngeal swab, presence of viral mutation(s) within the  areas targeted by this assay, and inadequate number of viral copies  (<250 copies / mL). A negative result must be combined with clinical  observations, patient history, and epidemiological information. If result is POSITIVE SARS-CoV-2 target nucleic acids are DETECTED. The SARS-CoV-2 RNA is generally detectable in upper and lower  respiratory specimens dur ing the acute phase of infection.  Positive  results are indicative of active infection with SARS-CoV-2.  Clinical  correlation with patient history and other diagnostic information is  necessary to determine patient infection status.  Positive results do  not rule out bacterial infection or co-infection with other viruses. If result is PRESUMPTIVE POSTIVE SARS-CoV-2 nucleic acids MAY BE PRESENT.   A presumptive positive result was obtained on the submitted specimen  and confirmed on repeat testing.  While 2019 novel coronavirus  (SARS-CoV-2) nucleic acids may be present in the submitted sample  additional confirmatory testing may be necessary for epidemiological  and / or clinical management purposes  to differentiate between  SARS-CoV-2 and other Sarbecovirus currently known to infect humans.  If clinically indicated additional testing with an alternate test  methodology [phone]) is advised. The  SARS-CoV-2 RNA is generally  detectable in upper and lower respiratory sp ecimens during the acute  phase of infection. The expected result is Negative. Fact Sheet for Patients:  BoilerBrush.com.cy Fact Sheet for Healthcare Providers: https://pope.com/ This test is not yet approved or cleared by the [place] FDA and has been authorized for detection and/or diagnosis of SARS-CoV-2 by FDA under an Emergency Use Authorization (EUA).  This EUA will remain in effect (meaning this test can be used) for the duration of the COVID-19 declaration under Section 564(b)(1) of the Act, 21 U.S.C. section 360bbb-3(b)(1), unless the authorization is terminated or revoked sooner. Performed at [facility], [address], [place], [place] [address]   MRSA PCR Screening     Status: None   Collection Time: 02/14/19  4:01 AM   Specimen: Nasopharyngeal  Result Value Ref Range Status   MRSA by PCR NEGATIVE NEGATIVE Final    Comment:  The GeneXpert MRSA Assay (FDA approved for NASAL specimens only), is one component of a comprehensive MRSA colonization surveillance program. It is not intended to diagnose MRSA infection nor to guide or monitor treatment for MRSA infections. Performed at [facility], [address] [place], [place] [address]          Radiology Studies: Dg Mandible 1-3 Views  Result Date: 02/15/2019 CLINICAL DATA:  Mandible pain. EXAM: MANDIBLE - 1-3 VIEW COMPARISON:  CT scan of the maxillofacial structures dated 12/26/2018 FINDINGS: The mandible appears normal including the temporomandibular joints. There multiple missing teeth. Metallic restorations are noted. IMPRESSION: No significant abnormality of the mandible. Electronically Signed   By: Francene BoyersJames  Maxwell M.D.   On: 02/15/2019 16:55        Scheduled Meds: . aspirin EC  81 mg Oral Daily  . atorvastatin  40 mg Oral q1800  .  chlorhexidine  15 mL Mouth Rinse BID  . Chlorhexidine Gluconate Cloth  6 each Topical Daily  . digoxin  0.125 mg Oral Daily  . ferrous sulfate  325 mg Oral Daily  . furosemide  80 mg Intravenous BID  . levofloxacin  500 mg Oral Daily  . losartan  25 mg Oral QHS  . mouth rinse  15 mL Mouth Rinse q12n4p  . metroNIDAZOLE  500 mg Oral Q8H  . mometasone-formoterol  2 puff Inhalation BID  . multivitamin with minerals  1 tablet Oral Daily  . nicotine  14 mg Transdermal Daily  . sodium chloride flush  3 mL Intravenous Q12H  . warfarin  5 mg Oral ONCE-1800  . Warfarin - Pharmacist Dosing Inpatient   Does not apply q1800   Continuous Infusions: . sodium chloride       LOS: 2 days   Time spent= 35 mins    Andi Layfield Joline Maxcyhirag Vicky Mccanless, MD [facility]  If 7PM-7AM, please contact night-coverage [email or web address] 02/16/2019, 10:46 AM

## 2019-02-17 DIAGNOSIS — R0602 Shortness of breath: Secondary | ICD-10-CM

## 2019-02-17 LAB — CBC
HCT: 34.9 % — ABNORMAL LOW (ref 39.0–52.0)
Hemoglobin: 10.8 g/dL — ABNORMAL LOW (ref 13.0–17.0)
MCH: 25.3 pg — ABNORMAL LOW (ref 26.0–34.0)
MCHC: 30.9 g/dL (ref 30.0–36.0)
MCV: 81.7 fL (ref 80.0–100.0)
Platelets: 162 10*3/uL (ref 150–400)
RBC: 4.27 MIL/uL (ref 4.22–5.81)
RDW: 17.9 % — ABNORMAL HIGH (ref 11.5–15.5)
WBC: 4.7 10*3/uL (ref 4.0–10.5)
nRBC: 0.4 % — ABNORMAL HIGH (ref 0.0–0.2)

## 2019-02-17 LAB — COMPREHENSIVE METABOLIC PANEL
ALT: 25 U/L (ref 0–44)
AST: 49 U/L — ABNORMAL HIGH (ref 15–41)
Albumin: 2.7 g/dL — ABNORMAL LOW (ref 3.5–5.0)
Alkaline Phosphatase: 104 U/L (ref 38–126)
Anion gap: 9 (ref 5–15)
BUN: 29 mg/dL — ABNORMAL HIGH (ref 6–20)
CO2: 29 mmol/L (ref 22–32)
Calcium: 8.5 mg/dL — ABNORMAL LOW (ref 8.9–10.3)
Chloride: 96 mmol/L — ABNORMAL LOW (ref 98–111)
Creatinine, Ser: 1 mg/dL (ref 0.61–1.24)
GFR calc Af Amer: >60 mL/min (ref >60)
GFR calc non Af Amer: >60 mL/min (ref >60)
Glucose, Bld: 125 mg/dL — ABNORMAL HIGH (ref 70–99)
Potassium: 3.7 mmol/L (ref 3.5–5.1)
Sodium: 134 mmol/L — ABNORMAL LOW (ref 135–145)
Total Bilirubin: 1.3 mg/dL — ABNORMAL HIGH (ref 0.3–1.2)
Total Protein: 6.5 g/dL (ref 6.5–8.1)

## 2019-02-17 LAB — MAGNESIUM: Magnesium: 1.8 mg/dL (ref 1.7–2.4)

## 2019-02-17 LAB — PROTIME-INR
INR: 2.4 — ABNORMAL HIGH (ref 0.8–1.2)
Prothrombin Time: 25.4 seconds — ABNORMAL HIGH (ref 11.4–15.2)

## 2019-02-17 MED ORDER — MAGNESIUM OXIDE 400 (241.3 MG) MG PO TABS
800.0000 mg | ORAL_TABLET | ORAL | Status: AC
  Administered 2019-02-17 (×2): 800 mg via ORAL
  Filled 2019-02-17 (×2): qty 2

## 2019-02-17 MED ORDER — POTASSIUM CHLORIDE CRYS ER 20 MEQ PO TBCR
40.0000 meq | EXTENDED_RELEASE_TABLET | Freq: Once | ORAL | Status: AC
  Administered 2019-02-17: 40 meq via ORAL
  Filled 2019-02-17: qty 2

## 2019-02-17 MED ORDER — SPIRONOLACTONE 12.5 MG HALF TABLET
12.5000 mg | ORAL_TABLET | Freq: Every day | ORAL | Status: DC
  Administered 2019-02-17 – 2019-02-18 (×2): 12.5 mg via ORAL
  Filled 2019-02-17 (×2): qty 1

## 2019-02-17 MED ORDER — WARFARIN SODIUM 3 MG PO TABS
3.0000 mg | ORAL_TABLET | Freq: Once | ORAL | Status: AC
  Administered 2019-02-17: 3 mg via ORAL
  Filled 2019-02-17: qty 1

## 2019-02-17 NOTE — Progress Notes (Signed)
PROGRESS NOTE    Mark [name]  MLJ:[id number] DOB: 10-05-1975 DOA: 02/13/2019 PCP: Grayce Sessions, NP   Brief Narrative:  43 year old with history of systolic congestive heart failure, ejection fraction 10%, nonischemic cardiomyopathy, CVA, CKD stage II, tobacco use, medical noncompliance, illicit drug use came to the hospital with complains of progressive shortness of breath.  Found to be in fluid overload secondary to heart failure initially had to be placed on BiPAP.  He was diuresed well with IV Lasix during the hospitalization and cardiology team was following.  During the hospitalization he also reported of dental pain which he has been dealing with for the past several weeks and was diagnosed with dental abscess.  He has been given a round of clindamycin and Bactrim but he states he is allergic to it.   Assessment & Plan:   Principal Problem:   Acute on chronic systolic CHF (congestive heart failure) (HCC) Active Problems:   Tobacco abuse   Tachycardia   CKD (chronic kidney disease) stage 2, GFR 60-89 ml/min   Chronic anemia   Shortness of breath   Acute CHF (congestive heart failure) (HCC)   Allergic reaction caused by a drug   Abnormal liver function  Acute respiratory distress requiring BiPAP Acute congestive heart failure with reduced ejection fraction, 10%, class III - Continue IV Lasix.  Aldactone added.  Compressive stockings, strict input and output - Likely not a candidate for advanced cardiac therapy.  Continue medical management.  Issues with noncompliance. -Cardiology team following. -Supportive care.  Wean him off BiPAP as deemed appropriate -We will monitor his renal function.  Mandibular pain with poor dentition, concerning for Dental Abscess -X-rays negative for obvious infection but does have poor dentition needs to follow-up outpatient with dentist. - Given his allergy to penicillin, clindamycin and Bactrim.  Claims he is having some swelling with  Levaquin and Flagyl as well.  I do not see this, I have advised him to take this medications with Benadryl.  Limited antibiotic choices at this point - We will call Dr. Kristin Bruins, denitist, tomorrow morning.   Apical thrombus - Currently patient is on Coumadin.  Pharmacy to dose.  Questionable facial swelling - States it could be from Bactrim.  Previously has developed angioedema secondary to ACE inhibitor/Entresto.  Continue losartan.  No obvious evidence of mucosal swelling.  This was treated with IV Benadryl and Pepcid in the ER.  Transaminitis, stable -Suspect this is secondary to hepatic congestion from the heart failure.  Overnight provider ordered acute hepatitis panel right upper quadrant ultrasound.  We will follow-up and trend LFTs.  Hyperlipidemia -On statin. I dont see a reason to d/c at this time. Monitor LFTs.   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  Counseled to quit using this.  Tobacco use -Advised to quit plan  COPD appears to be stable  DVT prophylaxis: Coumadin Code Status: Full  Family Communication: None at bedside Disposition Plan: Maintain hospital stay for IV diuresis  Consultants:   Cardiology  Procedures:   None  Antimicrobials:   Flagyl day 2  Levaquin day 2   Subjective: Patient reports his breathing is slightly better but has bilateral lower extremity pitting edema and swelling.  Tolerating BiPAP off and on but much better.  He tried taking Levaquin and Flagyl yesterday, did well with the first dose.  Second dose with Flagyl he had some itching and swelling which he  states.  He continues to ask me to help him treat this but I have advised him that at this point he has limited antibiotic choice and will need to see a dentist for surgical intervention.  He understands that he has limited choices given lack of insurance.  Review of Systems Otherwise negative  except as per HPI, including: General = no fevers, chills, dizziness, malaise, fatigue HEENT/EYES = negative for pain, redness, loss of vision, double vision, blurred vision, loss of hearing, sore throat, hoarseness, dysphagia Cardiovascular= negative for chest pain, palpitation, murmurs, lower extremity swelling Respiratory/lungs= negative for shortness of breath, cough, hemoptysis, wheezing, mucus production Gastrointestinal= negative for nausea, vomiting,, abdominal pain, melena, hematemesis Genitourinary= negative for Dysuria, Hematuria, Change in Urinary Frequency MSK = Negative for arthralgia, myalgias, Back Pain, Joint swelling  Neurology= Negative for headache, seizures, numbness, tingling  Psychiatry= Negative for anxiety, depression, suicidal and homocidal ideation Allergy/Immunology= Medication/Food allergy as listed  Skin= Negative for Rash, lesions, ulcers, itching   Objective: Vitals:   02/17/19 0538 02/17/19 0542 02/17/19 0757 02/17/19 1056  BP: (!) 107/91     Pulse: (!) 103   78  Resp: 18     Temp: 97.9 F (36.6 C)     TempSrc: Oral     SpO2: 100%  99%   Weight:  69.2 kg    Height:        Intake/Output Summary (Last 24 hours) at 02/17/2019 1146 Last data filed at 02/17/2019 1108 Gross per 24 hour  Intake 600 ml  Output 2350 ml  Net -1750 ml   Filed Weights   02/16/19 0500 02/16/19 1900 02/17/19 0542  Weight: 68.3 kg 69.2 kg 69.2 kg    Examination: Constitutional: NAD, calm, comfortable Eyes: PERRL, lids and conjunctivae normal ENMT: Mucous membranes are moist. Posterior pharynx clear of any exudate or lesions very poor dentition with missing teeth Neck: normal, supple, no masses, no thyromegaly Respiratory: Bibasilar crackles Cardiovascular: Regular rate and rhythm, no murmurs / rubs / gallops.  3+ bilateral lower extremity pitting edema. 2+ pedal pulses. No carotid bruits.  Abdomen: no tenderness, no masses palpated. No hepatosplenomegaly. Bowel sounds  positive.  Musculoskeletal: no clubbing / cyanosis. No joint deformity upper and lower extremities. Good ROM, no contractures. Normal muscle tone.  Skin: no rashes, lesions, ulcers. No induration Neurologic: CN 2-12 grossly intact. Sensation intact, DTR normal. Strength 5/5 in all 4.  Psychiatric: Normal judgment and insight. Alert and oriented x 3. Normal mood.     Data Reviewed:   CBC: Recent Labs  Lab 02/13/19 2243 02/14/19 0415 02/15/19 0216 02/16/19 0225 02/17/19 0508  WBC 6.0 5.7 5.2 5.8 4.7  NEUTROABS 2.9  --   --   --   --   HGB 12.7* 12.2* 11.7* 11.5* 10.8*  HCT 41.0 38.5* 37.7* 36.4* 34.9*  MCV 83.5 82.4 81.1 81.1 81.7  PLT 211 160 163 169 176   Basic Metabolic Panel: Recent Labs  Lab 02/13/19 2243 02/14/19 0415 02/15/19 0216 02/16/19 0225 02/17/19 0508  NA 137 136 137 137 134*  K 3.9 3.6 3.7 4.0 3.7  CL 98 99 100 98 96*  CO2 26 24 28 29 29   GLUCOSE 102* 100* 72 109* 125*  BUN 29* 28* 29* 29* 29*  CREATININE 1.36* 1.17   1.25* 1.20 1.18 1.00  CALCIUM 9.2 9.2 8.8* 8.5* 8.5*  MG  --   --  1.7 1.4* 1.8   GFR: Estimated Creatinine Clearance: 92.3 mL/min (by C-G formula based on SCr  of 1 mg/dL). Liver Function Tests: Recent Labs  Lab 02/13/19 2243 02/14/19 0415 02/15/19 0216 02/16/19 0225 02/17/19 0508  AST 71* 66* 52* 52* 49*  ALT 32 34 28 27 25   ALKPHOS 128* 116 104 105 104  BILITOT 2.4* 3.0* 2.3* 1.2 1.3*  PROT 7.7 7.6 6.8 6.6 6.5  ALBUMIN 3.3* 3.4* 2.9* 2.8* 2.7*   No results for input(s): LIPASE, AMYLASE in the last 168 hours. No results for input(s): AMMONIA in the last 168 hours. Coagulation Profile: Recent Labs  Lab 02/14/19 0140 02/15/19 0216 02/16/19 0225 02/17/19 0508  INR 2.0* 1.6* 2.0* 2.4*   Cardiac Enzymes: No results for input(s): CKTOTAL, CKMB, CKMBINDEX, TROPONINI in the last 168 hours. BNP (last 3 results) No results for input(s): PROBNP in the last 8760 hours. HbA1C: No results for input(s): HGBA1C in the last 72  hours. CBG: No results for input(s): GLUCAP in the last 168 hours. Lipid Profile: No results for input(s): CHOL, HDL, LDLCALC, TRIG, CHOLHDL, LDLDIRECT in the last 72 hours. Thyroid Function Tests: No results for input(s): TSH, T4TOTAL, FREET4, T3FREE, THYROIDAB in the last 72 hours. Anemia Panel: No results for input(s): VITAMINB12, FOLATE, FERRITIN, TIBC, IRON, RETICCTPCT in the last 72 hours. Sepsis Labs: No results for input(s): PROCALCITON, LATICACIDVEN in the last 168 hours.  Recent Results (from the past 240 hour(s))  SARS Coronavirus 2 (CEPHEID - Performed in [facility] hospital lab), Hosp Order     Status: None   Collection Time: 02/14/19  2:02 AM   Specimen: Nasopharyngeal Swab  Result Value Ref Range Status   SARS Coronavirus 2 NEGATIVE NEGATIVE Final    Comment: (NOTE) If result is NEGATIVE SARS-CoV-2 target nucleic acids are NOT DETECTED. The SARS-CoV-2 RNA is generally detectable in upper and lower  respiratory specimens during the acute phase of infection. The lowest  concentration of SARS-CoV-2 viral copies this assay can detect is 250  copies / mL. A negative result does not preclude SARS-CoV-2 infection  and should not be used as the sole basis for treatment or other  patient management decisions.  A negative result may occur with  improper specimen collection / handling, submission of specimen other  than nasopharyngeal swab, presence of viral mutation(s) within the  areas targeted by this assay, and inadequate number of viral copies  (<250 copies / mL). A negative result must be combined with clinical  observations, patient history, and epidemiological information. If result is POSITIVE SARS-CoV-2 target nucleic acids are DETECTED. The SARS-CoV-2 RNA is generally detectable in upper and lower  respiratory specimens dur ing the acute phase of infection.  Positive  results are indicative of active infection with SARS-CoV-2.  Clinical  correlation with patient  history and other diagnostic information is  necessary to determine patient infection status.  Positive results do  not rule out bacterial infection or co-infection with other viruses. If result is PRESUMPTIVE POSTIVE SARS-CoV-2 nucleic acids MAY BE PRESENT.   A presumptive positive result was obtained on the submitted specimen  and confirmed on repeat testing.  While 2019 novel coronavirus  (SARS-CoV-2) nucleic acids may be present in the submitted sample  additional confirmatory testing may be necessary for epidemiological  and / or clinical management purposes  to differentiate between  SARS-CoV-2 and other Sarbecovirus currently known to infect humans.  If clinically indicated additional testing with an alternate test  methodology [phone]([id number]) is advised. The SARS-CoV-2 RNA is generally  detectable in upper and lower respiratory sp ecimens during the acute  phase of infection. The expected result is Negative. Fact Sheet for Patients:  BoilerBrush.com.cyhttps://www.fda.gov/media/136312/download Fact Sheet for Healthcare Providers: https://pope.com/https://www.fda.gov/media/136313/download This test is not yet approved or cleared by the [place] FDA and has been authorized for detection and/or diagnosis of SARS-CoV-2 by FDA under an Emergency Use Authorization (EUA).  This EUA will remain in effect (meaning this test can be used) for the duration of the COVID-19 declaration under Section 564(b)(1) of the Act, 21 U.S.C. section 360bbb-3(b)(1), unless the authorization is terminated or revoked sooner. Performed at [facility], [address] [place], [place] [address]   MRSA PCR Screening     Status: None   Collection Time: 02/14/19  4:01 AM   Specimen: Nasopharyngeal  Result Value Ref Range Status   MRSA by PCR NEGATIVE NEGATIVE Final    Comment:        The GeneXpert MRSA Assay (FDA approved for NASAL specimens only), is one component of a comprehensive MRSA colonization surveillance  program. It is not intended to diagnose MRSA infection nor to guide or monitor treatment for MRSA infections. Performed at [facility], [address] [place], [place] [address]          Radiology Studies: Dg Mandible 1-3 Views  Result Date: 02/15/2019 CLINICAL DATA:  Mandible pain. EXAM: MANDIBLE - 1-3 VIEW COMPARISON:  CT scan of the maxillofacial structures dated 12/26/2018 FINDINGS: The mandible appears normal including the temporomandibular joints. There multiple missing teeth. Metallic restorations are noted. IMPRESSION: No significant abnormality of the mandible. Electronically Signed   By: Francene BoyersJames  Maxwell M.D.   On: 02/15/2019 16:55        Scheduled Meds:  aspirin EC  81 mg Oral Daily   atorvastatin  40 mg Oral q1800   chlorhexidine  15 mL Mouth Rinse BID   Chlorhexidine Gluconate Cloth  6 each Topical Daily   digoxin  0.125 mg Oral Daily   ferrous sulfate  325 mg Oral Daily   furosemide  80 mg Intravenous BID   levofloxacin  500 mg Oral Daily   losartan  25 mg Oral QHS   magnesium oxide  800 mg Oral Q4H   mouth rinse  15 mL Mouth Rinse q12n4p   metroNIDAZOLE  500 mg Oral Q8H   mometasone-formoterol  2 puff Inhalation BID   multivitamin with minerals  1 tablet Oral Daily   nicotine  14 mg Transdermal Daily   sodium chloride flush  3 mL Intravenous Q12H   spironolactone  12.5 mg Oral Daily   warfarin  3 mg Oral ONCE-1800   Warfarin - Pharmacist Dosing Inpatient   Does not apply q1800   Continuous Infusions:  sodium chloride       LOS: 3 days   Time spent= 35 mins    Mark Eickhoff Joline Maxcyhirag Anjeli Casad, MD [facility]  If 7PM-7AM, please contact night-coverage [email or web address] 02/17/2019, 11:46 AM

## 2019-02-17 NOTE — Progress Notes (Signed)
Patient stated that he is allergic to Flagyl because he is itching. Refused Morning dose of Flagyl. Pt. Was given Benadryl 25 mg PO.  PCP was notified

## 2019-02-17 NOTE — Progress Notes (Addendum)
[place] for warfarin Indication: apical thrombus  Allergies  Allergen Reactions  . Hydrocodone Hives  . Lisinopril Swelling and Other (See Comments)    Facial swelling/angioedema  . Prednisone Shortness Of Breath, Nausea Only, Swelling and Other (See Comments)    Also made chest feel tight and genital area, legs, and face became swollen badly  . Bactrim [Sulfamethoxazole-Trimethoprim]   . Clindamycin/Lincomycin     "it swells me up and makes my heart beat fast"  . Penicillins Hives and Swelling     Has patient had a PCN reaction causing immediate rash, facial/tongue/throat swelling, SOB or lightheadedness with hypotension: Yes Has patient had a PCN reaction causing severe rash involving mucus membranes or skin necrosis: No Has patient had a PCN reaction that required hospitalization: No Has patient had a PCN reaction occurring within the last 10 years: No If all of the above answers are "NO", then may proceed with Cephalosporin use.     Vital Signs: Temp: 97.9 F (36.6 C) (06/28 0538) Temp Source: Oral (06/28 0538) BP: 107/91 (06/28 0538) Pulse Rate: 103 (06/28 0538)  Labs: Recent Labs    02/15/19 0216 02/16/19 0225 02/17/19 0508  HGB 11.7* 11.5* 10.8*  HCT 37.7* 36.4* 34.9*  PLT 163 169 162  LABPROT 18.9* 22.1* 25.4*  INR 1.6* 2.0* 2.4*  CREATININE 1.20 1.18 1.00    Estimated Creatinine Clearance: 92.3 mL/min (by C-G formula based on SCr of 1 mg/dL).   Infusions:  . sodium chloride      Assessment: 11 yoM admitted with alleged allergic rxn to bactrim. On PTA warfarin for apical thrombus; pharmacy to continue while inpatient.   Baseline INR therapeutic  Prior anticoagulation: warfarin 5 mg daily, LD 6/24  Significant events:  Today, 02/17/2019:  CBC: Hgb down to 10.8, Plt stable WNL  INR therapeutic (2.4)  Major drug interactions: on Flagyl and Levaquin  No bleeding issues per nursing  Diet ordered  Goal  of Therapy: INR 2-3  Plan:  Warfarin 3 mg x 1 tonight; expect Flagyl/Levaquin will increase INR so will decrease dose  Daily INR  CBC at least q72 hr while on warfarin  Monitor for signs of bleeding or thrombosis  Sherlon Handing, PharmD, BCPS Clinical pharmacist 02/17/2019, 8:09 AM

## 2019-02-17 NOTE — Progress Notes (Signed)
Progress Note  Patient Name: Mark ScottJeremie C Couchman Date of Encounter: 02/17/2019  Primary Cardiologist:  Verne Carrowhristopher McAlhany, MD  Subjective   He is feeling slightly better today, improved breathing, he hasn't walked yet, he has persistent B/L LE edema.  Inpatient Medications    Scheduled Meds:  aspirin EC  81 mg Oral Daily   atorvastatin  40 mg Oral q1800   chlorhexidine  15 mL Mouth Rinse BID   Chlorhexidine Gluconate Cloth  6 each Topical Daily   digoxin  0.125 mg Oral Daily   ferrous sulfate  325 mg Oral Daily   furosemide  80 mg Intravenous BID   levofloxacin  500 mg Oral Daily   losartan  25 mg Oral QHS   magnesium oxide  800 mg Oral Q4H   mouth rinse  15 mL Mouth Rinse q12n4p   metroNIDAZOLE  500 mg Oral Q8H   mometasone-formoterol  2 puff Inhalation BID   multivitamin with minerals  1 tablet Oral Daily   nicotine  14 mg Transdermal Daily   sodium chloride flush  3 mL Intravenous Q12H   warfarin  3 mg Oral ONCE-1800   Warfarin - Pharmacist Dosing Inpatient   Does not apply q1800   Continuous Infusions:  sodium chloride     PRN Meds: sodium chloride, albuterol, alum & mag hydroxide-simeth, diphenhydrAMINE, hydrALAZINE, hydrocortisone, hydrocortisone cream, lip balm, loratadine, Muscle Rub, phenol, polyethylene glycol, polyvinyl alcohol, senna-docusate, sodium chloride, sodium chloride flush   Vital Signs    Vitals:   02/16/19 2250 02/17/19 0538 02/17/19 0542 02/17/19 0757  BP:  (!) 107/91    Pulse:  (!) 103    Resp: 18 18    Temp:  97.9 F (36.6 C)    TempSrc:  Oral    SpO2:  100%  99%  Weight:   69.2 kg   Height:        Intake/Output Summary (Last 24 hours) at 02/17/2019 0953 Last data filed at 02/17/2019 0935 Gross per 24 hour  Intake 600 ml  Output 2250 ml  Net -1650 ml   Filed Weights   02/16/19 0500 02/16/19 1900 02/17/19 0542  Weight: 68.3 kg 69.2 kg 69.2 kg   Last Weight  Most recent update: 02/17/2019  5:42 AM   Weight    69.2 kg (152 lb 8 oz)           Weight change: 0.9 kg   Telemetry    SR, PVCs, 4 bt run NSVT - Personally Reviewed  ECG    06/25, ST, HR 116, no sig morphology change from 06/10 - Personally Reviewed  Physical Exam   General: Well developed, well nourished, male appearing in no acute distress. Head: Normocephalic, atraumatic.  Neck: Supple without bruits, JVD up to jaws. Lungs:  Resp regular and unlabored, rales bases Heart: RRR, S1, S2, no S3, S4, or murmur; no rub. Abdomen: Soft, non-tender, non-distended with normoactive bowel sounds. No hepatomegaly. No rebound/guarding. No obvious abdominal masses. Extremities: No clubbing, cyanosis, 2+ B/L LE edema. Distal pedal pulses are 2+ bilaterally. Neuro: Alert and oriented X 3. Moves all extremities spontaneously. Psych: Normal affect.  Labs    Hematology Recent Labs  Lab 02/15/19 0216 02/16/19 0225 02/17/19 0508  WBC 5.2 5.8 4.7  RBC 4.65 4.49 4.27  HGB 11.7* 11.5* 10.8*  HCT 37.7* 36.4* 34.9*  MCV 81.1 81.1 81.7  MCH 25.2* 25.6* 25.3*  MCHC 31.0 31.6 30.9  RDW 17.9* 17.7* 17.9*  PLT 163 169 162  Chemistry Recent Labs  Lab 02/15/19 0216 02/16/19 0225 02/17/19 0508  NA 137 137 134*  K 3.7 4.0 3.7  CL 100 98 96*  CO2 28 29 29   GLUCOSE 72 109* 125*  BUN 29* 29* 29*  CREATININE 1.20 1.18 1.00  CALCIUM 8.8* 8.5* 8.5*  PROT 6.8 6.6 6.5  ALBUMIN 2.9* 2.8* 2.7*  AST 52* 52* 49*  ALT 28 27 25   ALKPHOS 104 105 104  BILITOT 2.3* 1.2 1.3*  GFRNONAA >60 >60 >60  GFRAA >60 >60 >60  ANIONGAP 9 10 9      BNP Recent Labs  Lab 02/13/19 2243  BNP 3,902.9*    Lab Results  Component Value Date   INR 2.4 (H) 02/17/2019   INR 2.0 (H) 02/16/2019   INR 1.6 (H) 02/15/2019   Drugs of Abuse     Component Value Date/Time   LABOPIA NONE DETECTED 12/25/2018 0138   COCAINSCRNUR POSITIVE (A) 12/25/2018 0138   LABBENZ NONE DETECTED 12/25/2018 0138   AMPHETMU NONE DETECTED 12/25/2018 0138   THCU NONE DETECTED  12/25/2018 0138   LABBARB NONE DETECTED 12/25/2018 0138      Radiology    Dg Mandible 1-3 Views  Result Date: 02/15/2019 CLINICAL DATA:  Mandible pain. EXAM: MANDIBLE - 1-3 VIEW COMPARISON:  CT scan of the maxillofacial structures dated 12/26/2018 FINDINGS: The mandible appears normal including the temporomandibular joints. There multiple missing teeth. Metallic restorations are noted. IMPRESSION: No significant abnormality of the mandible. Electronically Signed   By: Francene Boyers M.D.   On: 02/15/2019 16:55   Dg Chest 2 View  Result Date: 02/13/2019 CLINICAL DATA:  Allergic reaction. EXAM: CHEST - 2 VIEW COMPARISON:  Chest x-ray dated January 30, 2019 FINDINGS: The heart is significantly enlarged. The lungs are hyperexpanded. There is no pneumothorax. A background of emphysematous changes is suspected bilaterally. There is no acute osseous abnormality. IMPRESSION: No active cardiopulmonary disease. Again noted is significant cardiomegaly. Electronically Signed   By: Katherine Mantle M.D.   On: 02/13/2019 21:24   US Abdomen Limited Ruq  Result Date: 02/14/2019 CLINICAL DATA:  Abnormal LFTs. EXAM: ULTRASOUND ABDOMEN LIMITED RIGHT UPPER QUADRANT COMPARISON:  None. FINDINGS: Gallbladder: There is diffuse gallbladder wall thickening with the gallbladder measuring approximately 14 mm in thickness. The sonographic Eulah Pont sign is negative. There is pericholecystic free fluid. No gallstones are visualized. Common bile duct: Diameter: 2 mm Liver: The liver is heterogeneous. There is no discrete hepatic mass. There appears to be a somewhat nodular contour. There is a small amount of free fluid throughout the abdomen. Portal vein is patent on color Doppler imaging with normal direction of blood flow towards the liver. IMPRESSION: 1. Diffuse gallbladder wall thickening in the absence of gallstones or a positive sonographic Murphy sign. Gallbladder wall thickening is nonspecific and can be seen in patients with  ascites, heart failure, or hepatocellular dysfunction. 2. Small volume abdominal ascites. 3. Heterogeneous liver with a somewhat nodular contour is suspicious for cirrhosis. Electronically Signed   By: Katherine Mantle M.D.   On: 02/14/2019 03:42     Cardiac Studies   ECHO:  12/25/2018  1. The right ventricle has severely reduced systolic function. The cavity was severely enlarged. There is no increase in right ventricular wall thickness.  2. Left atrial size was mildly dilated.  3. Right atrial size was moderately dilated.  4. Severely thickened tricuspid valve leaflets.  5. The mitral valve is grossly normal. Mitral valve regurgitation is moderate to severe by color flow  Doppler. The MR jet is posteriorly-directed.  6. The tricuspid valve is grossly normal.  7. The pulmonic valve was grossly normal. Pulmonic valve regurgitation is moderate is mild by color flow Doppler.   8. Left Ventricle: The left ventricle has severely reduced systolic function, wih an ejection fraction of 5-10%. The cavity size was normal. There is no increase in left ventricular wall thickness. Definity contrast agent was given IV to delineate the left ventricular endocardial borders.  SUMMARY   Moderate LV dilatation and severe systolic dysfunction with LVEF 5-10% and diffuse hypokinesis. 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 Grade 2 diastolic dysfunction with severely elevated filling pressures.  Patient Profile     43 y.o. male w/ hx Chronic Systolic Heart Failure (EF 10-15%), NICM by cath in 2017, history of stroke 2019, CKD stage 2, asthma, polysubstance abuse, R-MCA CVA, and non-compliance was admitted 06/25 with acute on chronic CHF and possible allergic rxn to Bactrim.  Assessment & Plan    Principal Problem:   Acute on chronic systolic CHF (congestive heart failure) (HCC) Active Problems:   Tobacco abuse   Tachycardia   CKD (chronic kidney disease) stage 2, GFR 60-89  ml/min   Chronic anemia   Acute CHF (congestive heart failure) (HCC)   Allergic reaction caused by a drug   Abnormal liver function  1. Acute on chronic CHF, LV & RV failure:  - continue iv diuresis for now - order TED hose B/L - add spironolactone 12.5 mg po daily - need daily wts - I/O net neg 3.2 L so far, now 68 kg, baseline unknown, a year ago 160 lbs, 149 lbs on 05/10 - prognosis is very poor long term, need to stop substance abuse and that we would continue to try to help him with meds and home visits.  Unable to titrate meds, would continue digoxin, low dose losartan, would not start BB in the settings of acute CHF.  He is now on warfarin and we can see if he can comply with follow up of this treatment.    2. Apical thrombus - continue coumadin  3. ?allergy Bactrim - facial swelling not noticeable now - we should try to get Dr Enrique Sack evaluate him in the hospital  Ena Dawley  9:53 AM  02/17/2019

## 2019-02-18 DIAGNOSIS — I513 Intracardiac thrombosis, not elsewhere classified: Secondary | ICD-10-CM

## 2019-02-18 LAB — BASIC METABOLIC PANEL
Anion gap: 9 (ref 5–15)
BUN: 26 mg/dL — ABNORMAL HIGH (ref 6–20)
CO2: 28 mmol/L (ref 22–32)
Calcium: 8.5 mg/dL — ABNORMAL LOW (ref 8.9–10.3)
Chloride: 97 mmol/L — ABNORMAL LOW (ref 98–111)
Creatinine, Ser: 0.93 mg/dL (ref 0.61–1.24)
GFR calc Af Amer: >60 mL/min (ref >60)
GFR calc non Af Amer: >60 mL/min (ref >60)
Glucose, Bld: 87 mg/dL (ref 70–99)
Potassium: 3.6 mmol/L (ref 3.5–5.1)
Sodium: 134 mmol/L — ABNORMAL LOW (ref 135–145)

## 2019-02-18 LAB — CBC
HCT: 37.2 % — ABNORMAL LOW (ref 39.0–52.0)
Hemoglobin: 11.2 g/dL — ABNORMAL LOW (ref 13.0–17.0)
MCH: 25 pg — ABNORMAL LOW (ref 26.0–34.0)
MCHC: 30.1 g/dL (ref 30.0–36.0)
MCV: 83 fL (ref 80.0–100.0)
Platelets: 153 10*3/uL (ref 150–400)
RBC: 4.48 MIL/uL (ref 4.22–5.81)
RDW: 18.4 % — ABNORMAL HIGH (ref 11.5–15.5)
WBC: 5 10*3/uL (ref 4.0–10.5)
nRBC: 0 % (ref 0.0–0.2)

## 2019-02-18 LAB — PROTIME-INR
INR: 2.4 — ABNORMAL HIGH (ref 0.8–1.2)
Prothrombin Time: 25.5 seconds — ABNORMAL HIGH (ref 11.4–15.2)

## 2019-02-18 LAB — MAGNESIUM: Magnesium: 1.9 mg/dL (ref 1.7–2.4)

## 2019-02-18 MED ORDER — LEVOFLOXACIN 500 MG PO TABS: 500.0000 mg | ORAL_TABLET | Freq: Every day | ORAL | 0 refills | Status: AC

## 2019-02-18 MED ORDER — METRONIDAZOLE 500 MG PO TABS: 500.0000 mg | ORAL_TABLET | Freq: Three times a day (TID) | ORAL | 0 refills | Status: AC

## 2019-02-18 MED ORDER — SPIRONOLACTONE 25 MG PO TABS: 12.5000 mg | ORAL_TABLET | Freq: Every day | ORAL | 0 refills | Status: DC

## 2019-02-18 MED ORDER — DIPHENHYDRAMINE HCL 25 MG PO TABS: 25.0000 mg | ORAL_TABLET | Freq: Three times a day (TID) | ORAL | 0 refills | Status: DC | PRN

## 2019-02-18 MED ORDER — DIPHENHYDRAMINE HCL 50 MG PO CAPS
50.0000 mg | ORAL_CAPSULE | Freq: Three times a day (TID) | ORAL | Status: DC | PRN
  Administered 2019-02-18: 50 mg via ORAL
  Filled 2019-02-18: qty 1

## 2019-02-18 MED ORDER — FUROSEMIDE 40 MG PO TABS: 80.0000 mg | ORAL_TABLET | Freq: Every day | ORAL | Status: DC

## 2019-02-18 MED FILL — levoFLOXacin 500 MG TABS: 500 | 7 days supply | Qty: 7 | Fill #0

## 2019-02-18 MED FILL — metroNIDAZOLE 500 MG TABS: 500 | 7 days supply | Qty: 21 | Fill #0

## 2019-02-18 NOTE — Progress Notes (Addendum)
[place]in for warfarin Indication: apical thrombus  Allergies  Allergen Reactions  . Hydrocodone Hives  . Lisinopril Swelling and Other (See Comments)    Facial swelling/angioedema  . Prednisone Shortness Of Breath, Nausea Only, Swelling and Other (See Comments)    Also made chest feel tight and genital area, legs, and face became swollen badly  . Bactrim [Sulfamethoxazole-Trimethoprim]   . Clindamycin/Lincomycin     "it swells me up and makes my heart beat fast"  . Penicillins Hives and Swelling     Has patient had a PCN reaction causing immediate rash, facial/tongue/throat swelling, SOB or lightheadedness with hypotension: Yes Has patient had a PCN reaction causing severe rash involving mucus membranes or skin necrosis: No Has patient had a PCN reaction that required hospitalization: No Has patient had a PCN reaction occurring within the last 10 years: No If all of the above answers are "NO", then may proceed with Cephalosporin use.     Vital Signs: Temp: 98.4 F (36.9 C) (06/29 0645) Temp Source: Oral (06/29 0645) BP: 109/85 (06/29 0645) Pulse Rate: 101 (06/29 0645)  Labs: Recent Labs    02/16/19 0225 02/17/19 0508 02/18/19 0550  HGB 11.5* 10.8* 11.2*  HCT 36.4* 34.9* 37.2*  PLT 169 162 153  LABPROT 22.1* 25.4* 25.5*  INR 2.0* 2.4* 2.4*  CREATININE 1.18 1.00 0.93    Estimated Creatinine Clearance: 96.1 mL/min (by C-G formula based on SCr of 0.93 mg/dL).   Infusions:  . sodium chloride      Assessment: 82 yoM admitted with alleged allergic rxn to bactrim. On PTA warfarin for apical thrombus; pharmacy to continue while inpatient.   Baseline INR therapeutic  Prior anticoagulation: warfarin 5 mg daily, LD 6/24  Significant events:   INR remains at 2.4. Hgb 11.2, plt 153  Goal of Therapy: INR 2-3  Plan:  Warfarin 4 mg x 1 tonight; If dc today, sent out on 4mg  PO qday then check INR Wed  Daily INR  CBC at  least q72 hr while on warfarin  Monitor for signs of bleeding or thrombosis  Onnie Boer, PharmD, BCIDP, AAHIVP, CPP Infectious Disease Pharmacist 02/18/2019 12:10 PM

## 2019-02-18 NOTE — TOC Transition Note (Signed)
Transition of Care [facility]) - CM/SW Discharge Note   Patient Details  Name: Mark [name] MRN: [id number] Date of Birth: 05/07/1976  [facility] CM/SW Contact:  Dessa Phi, RN Phone Number: 02/18/2019, 10:56 AM   Clinical Narrative: Provided w/dentist resource list for outpatient setting. No further CM needs.      Final next level of care: Home/Self Care Barriers to Discharge: No Barriers Identified   Patient Goals and CMS Choice        Discharge Placement                       Discharge Plan and Services   Discharge Planning Services: CM Consult, Medication Assistance                                 Social Determinants of Health (SDOH) Interventions     Readmission Risk Interventions Readmission Risk Prevention Plan 02/14/2019 01/25/2019  Transportation Screening Complete Complete  PCP or Specialist Appt within 3-5 Days Not Complete Complete  Not Complete comments continue to assess prior d/c. -  HRI or [place] Not Complete Complete  HRI or Home Care Consult comments continue to assess -  Social Work Consult for [place] Planning/Counseling Not Complete Complete  SW consult not completed comments continue to assess -  Palliative Care Screening Not Applicable Complete  Medication Review Press photographer) Not Complete Complete  Med Review Comments continue to assess till d/c -  Some recent data might be hidden

## 2019-02-18 NOTE — Discharge Instructions (Signed)
Heart Failure, Self Care Heart failure is a serious condition. This document explains the things you need to do to take care of yourself after a heart failure diagnosis. You may be asked to change your diet, take certain medicines, and make other lifestyle changes in order to stay as healthy as possible. Your health care provider may also give you more specific instructions. If you have problems or questions, contact your health care provider. What are the risks? Having heart failure puts you at higher risk for certain problems. These problems can get worse if you do not take good care of yourself. Problems may include:  Blood clotting problems. This may cause a stroke.  Damage to the kidneys, liver, or lungs.  Abnormal heart rhythms. Supplies needed:  Scale for monitoring weight.  Blood pressure monitor.  Notebook.  Medicines. How to care for yourself when you have heart failure Medicines Take over-the-counter and prescription medicines only as told by your health care provider. Medicines reduce the workload of your heart, slow the progression of heart failure, and improve symptoms. Take your medicines every day.  Do not stop taking your medicine unless your health care provider tells you to do so.  Do not skip any dose of medicine.  Refill your prescriptions before you run out of medicine. Eating and drinking   Eat heart-healthy foods. Talk with a dietitian to make an eating plan that is right for you. ? Choose foods that contain no trans fat and are low in saturated fat and cholesterol. Healthy choices include fresh or frozen fruits and vegetables, fish, lean meats, legumes, fat-free or low-fat dairy products, and whole-grain or high-fiber foods. ? Limit salt (sodium) if told by your health care provider. Sodium restriction may reduce symptoms of heart failure. Ask a dietitian to recommend heart-healthy seasonings. ? Use healthy cooking methods instead of frying. Healthy methods  include roasting, grilling, broiling, baking, poaching, steaming, and stir-frying.  Limit your fluid intake, if directed by your health care provider. Fluid restriction may reduce symptoms of heart failure. Alcohol use  Do not drink alcohol if: ? Your health care provider tells you not to drink. ? Your heart was damaged by alcohol, or you have severe heart failure. ? You are pregnant, may be pregnant, or are planning to become pregnant.  If you drink alcohol: ? Limit how much you use to:  0-1 drink a day for women.  0-2 drinks a day for men. ? Be aware of how much alcohol is in your drink. In the [place] one drink equals one 12 oz bottle of beer (355 mL), one 5 oz glass of wine (148 mL), or one 1 oz glass of hard liquor (44 mL). Lifestyle   Do not use any products that contain nicotine or tobacco, such as cigarettes, e-cigarettes, and chewing tobacco. If you need help quitting, ask your health care provider. ? Do not use nicotine gum or patches before talking to your health care provider.  Do not use illegal drugs.  Work with your health care provider to safely reach the right body weight.  Do physical activity if told by your health care provider. Talk to your health care provider before you begin an exercise if: ? You are an older adult. ? You have severe heart failure.  Learn to manage stress. If you need help to do this, ask your health care provider.  Participate in or seek rehabilitation as needed to keep or improve your independence and quality of life.  Plan   rest periods when you get tired. °Monitoring important information ° °· Weigh yourself every day. This will help you to notice if too much fluid is building up in your body. °? Weigh yourself every morning after you urinate and before you eat breakfast. °? Wear the same amount of clothing each time you weigh yourself. °? Record your daily weight. Provide your health care provider with your weight record. °· Monitor and  record your pulse and blood pressure as told by your health care provider. °Dealing with extreme temperatures °· If the weather is extremely hot: °? Avoid vigorous physical activity. °? Use air conditioning or fans, or find a cooler location. °? Avoid caffeine and alcohol. °? Wear loose-fitting, lightweight, and light-colored clothing. °· If the weather is extremely cold: °? Avoid vigorous activity. °? Layer your clothes. °? Wear mittens or gloves, a hat, and a scarf when you go outside. °? Avoid alcohol. °Follow these instructions at home: °· Stay up to date with vaccines. Pneumococcal and flu (influenza) vaccines are especially important in preventing infections of the airways. °· Keep all follow-up visits as told by your health care provider. This is important. °Contact a health care provider if you: °· Have a rapid weight gain. °· Have increasing shortness of breath. °· Are unable to participate in your usual physical activities. °· Get tired easily. °· Cough more than normal, especially with physical activity. °· Lose your appetite or feel nauseous. °· Have any swelling or more swelling in areas such as your hands, feet, ankles, or abdomen. °· Are unable to sleep because it is hard to breathe. °· Feel like your heart is beating quickly (palpitations). °· Become dizzy or light-headed when you stand up. °Get help right away if you: °· Have trouble breathing. °· Notice or your family notices a change in your awareness, such as having trouble staying awake or concentrating. °· Have pain or discomfort in your chest. °· Have an episode of fainting (syncope). °These symptoms may represent a serious problem that is an emergency. Do not wait to see if the symptoms will go away. Get medical help right away. Call your local emergency services (911 in the [place] Do not drive yourself to the hospital. °Summary °· Heart failure is a serious condition. To care for yourself, you may be asked to change your diet, take certain  medicines, and make other lifestyle changes. °· Take your medicines every day. Do not stop taking them unless your health care provider tells you to do so. °· Eat heart-healthy foods, such as fresh or frozen fruits and vegetables, fish, lean meats, legumes, fat-free or low-fat dairy products, and whole-grain or high-fiber foods. °· Ask your health care provider if you have any alcohol restrictions. You may have to stop drinking alcohol if you have severe heart failure. °· Contact your health care provider if you notice problems, such as rapid weight gain or a fast heartbeat. Get help right away if you faint, or have chest pain or trouble breathing. °This information is not intended to replace advice given to you by your health care provider. Make sure you discuss any questions you have with your health care provider. °Document Released: 11/21/2018 Document Revised: 11/20/2018 Document Reviewed: 11/21/2018 °Elsevier Patient Education © 2020 [employer] ° °

## 2019-02-18 NOTE — Progress Notes (Signed)
Patient discharged home.  IV removed - WNL.  Reviewed AVS and medications.  Emphasized importance of taking complete dose of antibiotics and on heart failure management at home.  Educational hand out given.  Patient verbalizes understanding and states he has a scale to weigh with at home.. no questions at this time.  Assisted off unit in NAD via WC

## 2019-02-18 NOTE — Progress Notes (Signed)
DENTAL CONSULTATION  Date of Consultation:  02/18/2019 Patient Name:   Mark [name] Date of Birth:   [birth year] Medical Record Number: [id number]  COVID 19 SCREENING: The patient does not symptoms concerning for COVID-19 infection (Including fever, chills, cough, or new SHORTNESS OF BREATH).    VITALS: BP 109/85 (BP Location: Right Arm)   Pulse (!) 101   Temp 98.4 F (36.9 C) (Oral)   Resp 18   Ht 5\' 9"  (1.753 m)   Wt 67 kg   SpO2 95%   BMI 21.80 kg/m   CHIEF COMPLAINT: The patient was referred by Dr. Stephania Fragmin for a dental consultation.  HPI: Mark [name] his a 43 year old male recently admitted to [facility] hospital for acute on chronic systolic congestive heart failure and shortness of breath.  Patient has been complaining of toothache symptoms over the past several weeks. Dental consultation was requested to evaluate poor dentition.  Patient as been complaining of toothache symptoms involving the upper right and upper left quadrants. The patient points to the area of #4 and #10. Patient has been complaining of the dull, achy,"sore", pain in these areas. The pain is intermittent in nature.  The pain last for hours at a time. The patient indicates pain reaches an intensity of 7/10 but is currently 0 out of 10-today.  This has  been occurring for the past 2-3 weeks.  Patient has received multiple prescriptions for antibiotic therapy from his physicians. Patient has had multiple allergic reactions to the medications prescribed including Bactrim, clindamycin, and penicillin.  The patient last saw a dentist to 3 years ago when he was "incarcerated". A tooth was pulled at that time with no complications. Patient denies having partial dentures. Patient denies having dental phobia.   PROBLEM LIST: Patient Active Problem List   Diagnosis Date Noted  . Allergic reaction caused by a drug 02/14/2019  . Abnormal liver function 02/14/2019  . Encounter for therapeutic drug monitoring 02/13/2019   . Encounter to establish care 02/13/2019  . 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 Acute CHF (congestive heart failure) (HCC) 01/24/2019  . Shortness of breath   . Cocaine use   . Acute on chronic systolic CHF (congestive heart failure) (HCC) 12/25/2018  . Demand ischemia (HCC)   . LV (left ventricular) mural thrombus without MI   . Noncompliance   . Chronic anemia   . Acute systolic CHF (congestive heart failure) (HCC) 11/09/2017  . History of right MCA stroke 09/28/2017  . Stroke (HCC) 09/28/2017  . Chronic systolic heart failure (HCC) 01/11/2016  . NICM (nonischemic cardiomyopathy) (HCC) 01/11/2016  . CKD (chronic kidney disease) stage 2, GFR 60-89 ml/min 01/11/2016  . Cocaine abuse (HCC) 01/11/2016  . Chest pain, pleuritic 01/03/2016  . Tobacco abuse 01/03/2016  . Asthma 01/03/2016  . Leg swelling 01/03/2016  . Tachycardia 01/03/2016  . Normocytic anemia 01/03/2016  . Elevated troponin I level 01/03/2016  . Right rib fracture 01/03/2016  . Chest pain     PMH: Past Medical History:  Diagnosis Date  . Asthma   . Chronic systolic CHF (congestive heart failure) (HCC)   . Cigarette smoker   . CKD (chronic kidney disease), stage II    Mark [name] 10/01/2017  . COPD (chronic obstructive pulmonary disease) (HCC) 10/21/2017   on CT scan chest  . History of echocardiogram    a. Echo 5/17 - EF 20-25%, severe diff HK, restrictive physiology, mild to mod MR, severe reduced RVSF, mod RVE,  mild RAE, mod TR, PASP 48 mmHg  . Hx of cardiac cath    a. LHC 5/17 - normal coronary arteries. PA 45/25, mean 33, PCWP mean 18  . NICM (nonischemic cardiomyopathy) (HCC)   . Stroke (HCC) 09/27/2017   "was weak on my left side; I'm fully recovered" (11/09/2017)  . Substance abuse (HCC)    cocaine, marijuana    PSH: Past Surgical History:  Procedure Laterality Date  . CARDIAC CATHETERIZATION N/A 01/05/2016   Procedure: Right/Left Heart Cath and Coronary Angiography;   Surgeon: Lennette Biharihomas A Kelly, MD;  [facility];  Service: Cardiovascular;  Laterality: N/A;    ALLERGIES: Allergies  Allergen Reactions  . Hydrocodone Hives  . Lisinopril Swelling and Other (See Comments)    Facial swelling/angioedema  . Prednisone Shortness Of Breath, Nausea Only, Swelling and Other (See Comments)    Also made chest feel tight and genital area, legs, and face became swollen badly  . Bactrim [Sulfamethoxazole-Trimethoprim]   . Clindamycin/Lincomycin     "it swells me up and makes my heart beat fast"  . Penicillins Hives and Swelling     Has patient had a PCN reaction causing immediate rash, facial/tongue/throat swelling, SOB or lightheadedness with hypotension: Yes Has patient had a PCN reaction causing severe rash involving mucus membranes or skin necrosis: No Has patient had a PCN reaction that required hospitalization: No Has patient had a PCN reaction occurring within the last 10 years: No If all of the above answers are "NO", then may proceed with Cephalosporin use.     MEDICATIONS: Current Facility-Administered Medications  Medication Dose Route Frequency Provider Last Rate Last Dose  . 0.9 %  sodium chloride infusion  250 mL Intravenous PRN Pearson GrippeKim, James, MD      . albuterol (PROVENTIL) (2.5 MG/3ML) 0.083% nebulizer solution 3 mL  3 mL Inhalation Q4H PRN Pearson GrippeKim, James, MD      . alum & mag hydroxide-simeth (MAALOX/MYLANTA) 200-200-20 MG/5ML suspension 30 mL  30 mL Oral Q4H PRN Amin, Loura HaltAnkit Chirag, MD      . aspirin EC tablet 81 mg  81 mg Oral Daily Pearson GrippeKim, James, MD   81 mg at 02/17/19 1056  . atorvastatin (LIPITOR) tablet 40 mg  40 mg Oral q1800 Pearson GrippeKim, James, MD   40 mg at 02/17/19 1706  . chlorhexidine (PERIDEX) 0.12 % solution 15 mL  15 mL Mouth Rinse BID Amin, Ankit Chirag, MD   15 mL at 02/17/19 1057  . Chlorhexidine Gluconate Cloth 2 % PADS 6 each  6 each Topical Daily Dimple NanasAmin, Ankit Chirag, MD   6 each at 02/16/19 0910  . digoxin (LANOXIN) tablet 0.125 mg   0.125 mg Oral Daily Pearson GrippeKim, James, MD   0.125 mg at 02/17/19 1057  . diphenhydrAMINE (BENADRYL) capsule 25 mg  25 mg Oral Q8H PRN Amin, Ankit Chirag, MD   25 mg at 02/17/19 2325  . ferrous sulfate tablet 325 mg  325 mg Oral Daily Pearson GrippeKim, James, MD   325 mg at 02/17/19 1056  . furosemide (LASIX) injection 80 mg  80 mg Intravenous BID Amin, Ankit Chirag, MD   80 mg at 02/17/19 1706  . hydrALAZINE (APRESOLINE) injection 10 mg  10 mg Intravenous Q4H PRN Amin, Ankit Chirag, MD      . hydrocortisone (ANUSOL-HC) 2.5 % rectal cream 1 application  1 application Topical QID PRN Amin, Ankit Chirag, MD      . hydrocortisone cream 1 % 1 application  1 application Topical TID  PRN Dimple NanasAmin, Ankit Chirag, MD   1 application at 02/15/19 0222  . levofloxacin (LEVAQUIN) tablet 500 mg  500 mg Oral Daily Amin, Ankit Chirag, MD   500 mg at 02/16/19 1527  . lip balm (CARMEX) ointment 1 application  1 application Topical PRN Amin, Ankit Chirag, MD      . loratadine (CLARITIN) tablet 10 mg  10 mg Oral Daily PRN Amin, Ankit Chirag, MD      . losartan (COZAAR) tablet 25 mg  25 mg Oral QHS Rollene RotundaHochrein, James, MD   25 mg at 02/17/19 1058  . MEDLINE mouth rinse  15 mL Mouth Rinse q12n4p Amin, Ankit Chirag, MD   15 mL at 02/16/19 1530  . metroNIDAZOLE (FLAGYL) tablet 500 mg  500 mg Oral Q8H Amin, Ankit Chirag, MD   500 mg at 02/16/19 2235  . mometasone-formoterol (DULERA) 100-5 MCG/ACT inhaler 2 puff  2 puff Inhalation BID Pearson GrippeKim, James, MD   2 puff at 02/18/19 [phone]  . multivitamin with minerals tablet 1 tablet  1 tablet Oral Daily Pearson GrippeKim, James, MD   1 tablet at 02/17/19 1056  . Muscle Rub CREA 1 application  1 application Topical PRN Amin, Ankit Chirag, MD      . nicotine (NICODERM CQ - dosed in mg/24 hours) patch 14 mg  14 mg Transdermal Daily Pearson GrippeKim, James, MD   14 mg at 02/17/19 1107  . phenol (CHLORASEPTIC) mouth spray 1 spray  1 spray Mouth/Throat PRN Amin, Ankit Chirag, MD      . polyethylene glycol (MIRALAX / GLYCOLAX) packet 17 g  17 g  Oral Daily PRN Amin, Ankit Chirag, MD      . polyvinyl alcohol (LIQUIFILM TEARS) 1.4 % ophthalmic solution 1 drop  1 drop Both Eyes PRN Amin, Ankit Chirag, MD      . senna-docusate (Senokot-S) tablet 2 tablet  2 tablet Oral QHS PRN Amin, Ankit Chirag, MD      . sodium chloride (OCEAN) 0.65 % nasal spray 1 spray  1 spray Each Nare PRN Amin, Ankit Chirag, MD      . sodium chloride flush (NS) 0.9 % injection 3 mL  3 mL Intravenous Q12H Pearson GrippeKim, James, MD   3 mL at 02/17/19 2325  . sodium chloride flush (NS) 0.9 % injection 3 mL  3 mL Intravenous PRN Pearson GrippeKim, James, MD      . spironolactone (ALDACTONE) tablet 12.5 mg  12.5 mg Oral Daily Lars MassonNelson, Katarina H, MD   12.5 mg at 02/17/19 1304  . Warfarin - Pharmacist Dosing Inpatient   Does not apply q1800 Danford BadWofford, Drew A, [facility] Center[facility]        LABS: Lab Results  Component Value Date   WBC 5.0 02/18/2019   HGB 11.2 (L) 02/18/2019   HCT 37.2 (L) 02/18/2019   MCV 83.0 02/18/2019   PLT 153 02/18/2019      Component Value Date/Time   NA 134 (L) 02/18/2019 0550   NA 143 12/01/2017 1000   K 3.6 02/18/2019 0550   CL 97 (L) 02/18/2019 0550   CO2 28 02/18/2019 0550   GLUCOSE 87 02/18/2019 0550   BUN 26 (H) 02/18/2019 0550   BUN 18 12/01/2017 1000   CREATININE 0.93 02/18/2019 0550   CALCIUM 8.5 (L) 02/18/2019 0550   GFRNONAA >60 02/18/2019 0550   GFRAA >60 02/18/2019 0550   Lab Results  Component Value Date   INR 2.4 (H) 02/18/2019   INR 2.4 (H) 02/17/2019   INR 2.0 (H) 02/16/2019   No  results found for: PTT  SOCIAL HISTORY: Social History   Socioeconomic History  . Marital status: Divorced    Spouse name: Not on file  . Number of children: Not on file  . Years of education: Not on file  . Highest education level: Not on file  Occupational History  . Not on file  Social Needs  . Financial resource strain: Not on file  . Food insecurity    Worry: Not on file    Inability: Not on file  . Transportation needs    Medical: Not on file    Non-medical:  Not on file  Tobacco Use  . Smoking status: Former Smoker    Packs/day: 1.00    Years: 30.00    Pack years: 30.00    Types: Cigarettes    Quit date: 09/27/2017    Years since quitting: 1.3  . Smokeless tobacco: Never Used  Substance and Sexual Activity  . Alcohol use: Yes    Alcohol/week: 5.0 standard drinks    Types: 5 Shots of liquor per week    Frequency: Never    Comment: 5-6 shots of vodka daily; "stopped it all after I had stroke 09/27/2017"  . Drug use: Yes    Types: Cocaine, Marijuana    Comment: 11/09/2017 "none since 09/27/2017"  . Sexual activity: Not Currently  Lifestyle  . Physical activity    Days per week: Not on file    Minutes per session: Not on file  . Stress: Not on file  Relationships  . Social Musicianconnections    Talks on phone: Not on file    Gets together: Not on file    Attends religious service: Not on file    Active member of club or organization: Not on file    Attends meetings of clubs or organizations: Not on file    Relationship status: Not on file  . Intimate partner violence    Fear of current or ex partner: Not on file    Emotionally abused: Not on file    Physically abused: Not on file    Forced sexual activity: Not on file  Other Topics Concern  . Not on file  Social History Narrative  . Not on file    FAMILY HISTORY: Family History  Problem Relation Age of Onset  . Cardiomyopathy Father        Reports his father has an LVAD  . Heart failure Father   . Hypertension Father   . Heart disease Maternal Grandmother        had a whole in her heart  . Deep vein thrombosis Neg Hx     REVIEW OF SYSTEMS: Reviewed with the patient as per History of present illness. Psych: Patient denies having dental phobia.  DENTAL HISTORY: CHIEF COMPLAINT: The patient was referred by Dr. Stephania FragminAnkit Amin for a dental consultation.  HPI: Mark [name] his a 43 year old male [facility] acute on chronic systolic congestive heart  failure and shortness of breath.  Patient has been complaining of toothache symptoms over the past several weeks. Dental consultation was requested to evaluate poor dentition.  Patient as been complaining of toothache symptoms involving the upper right and upper left quadrants. The patient points to the area of #4 and #10. Patient has been complaining of the dull, achy,"sore", pain in these areas. The pain is intermittent in nature.  The pain last for hours at a time. The patient indicates pain reaches an intensity of 7/10  but is currently 0 out of 10-today.  This has  been occurring for the past 2-3 weeks.  Patient has received multiple prescriptions for antibiotic therapy from his physicians. Patient has had multiple allergic reactions to the medications prescribed including Bactrim, clindamycin, and penicillin.  The patient last saw a dentist to 3 years ago when he was "incarcerated". A tooth was pulled at that time with no complications. Patient denies having partial dentures. Patient denies having dental phobia.  DENTAL EXAMINATION: GENERAL:  The patient is a well-developed, well-nourished male in no acute distress. HEAD AND NECK:  There is no palpable submandibular lymphadenopathy. The patient denies acute TMJ symptoms. INTRAORAL EXAM:  The patient has normal saliva. There is no evidence of oral abscess formation.  The patient has bilateral mandibular lingual tori. DENTITION:  The patient is missing tooth numbers 1, 13, 15, 16, 17, 24, 25, and 29.   PERIODONTAL:  The patient has chronic periodontitis with plaque and calculus accumulations, gingival recession and incipient to moderate bone loss. Multiple teeth have tooth mobility as per dental charting form. There is radiographic calculus noted. DENTAL CARIES/SUBOPTIMAL RESTORATIONS:  Multiple dental caries are noted as per dental charting form. ENDODONTIC:  Patient has been complaining of acute pulpitis symptoms. Patient appears to have periapical  pathology and radiolucency associated with the apices of tooth numbers 4, 5, 10, and 13. CROWN AND BRIDGE:  There are no crown or bridge restorations. PROSTHODONTIC:  The patient denies having partial dentures. OCCLUSION:  The patient has a poor occlusal scheme secondary to multiple missing teeth, multiple retained root segments, and lack of replacement of missing teeth with dental prostheses. The patient is missing tooth numbers 24 and 25 but denies having these teeth removed for orthodontic purposes. The patient denies ever having had orthodontic braces.  RADIOGRAPHIC INTERPRETATION: An orthopantogram was taken and supplemented with a full series of dental radiographs in the Department of Dental Medicine. Patient is missing tooth numbers 1, 13, 15, 16, 17, 24, 25, and 29.  There is incipient to moderate bone loss noted. Dental caries are noted. Some of the dental caries are impinging on the pulpal contents.The patient has periapical pathology and radiolucency associated with tooth numbers 4, 5, 10, and 12.  There is supra-eruption and drifting of the unopposed teeth into the edentulous areas.  Radiographic calculus is noted.   ASSESSMENTS: 1. Acute on chronic systolic congestive heart failure with EF of 5-10%. 2. History of acute pulpitis 3. Chronic apical periodontitis 4. Dental caries 5. Retained root segments 6. Chronic periodontitis with bone loss 7. Gingival recession 8. Accretions 9. Tooth mobility 10. Multiple missing teeth 11. Poor occlusal scheme and malocclusion 12. Risk for bleeding with invasive dental procedures due to current Coumadin therapy for treatment of apical thrombus. 13. Risk for complications up to and including death with anticipated invasive dental procedures in the operating room with general anesthesia due to the patient's cardiovascular and respiratory compromise. 14. Risk for disulfiram reaction with the use of alcohol-containing mouth rinses and current  metronidazole oral antibiotic therapy.  PLAN/RECOMMENDATIONS: 1. I discussed the risks, benefits, and complications of various treatment options with the patient in relationship to his medical and dental conditions, acute on chronic systolic congestive heart failure, significantly reduced ejection fraction, current warfarin therapy, risk for bleeding with invasive dental procedures, and risk for complications while off of the Coumadin therapy. We discussed various treatment options to include no treatment, multiple extractions with alveoloplasty, pre-prosthetic surgery as indicated, periodontal therapy, dental restorations,  root canal therapy, crown and bridge therapy, implant therapy, and replacement of missing teeth as indicated. We also discussed referral to an oral surgeon for the dental extraction procedures.  The patient refuses referral to an oral surgeon. The patient currently wishes to proceed with having dental medicine perform multiple dental extractions with alveoloplasty in the operating room with general anesthesia once medically cleared from the heart failure team.  This will need to be accomplished as an outpatient as he has pending discharge later this morning. The patient will then have his Coumadin therapy adjusted by the cardiology team to allow for dental extraction procedures in the operating room and [facility].  Patient will also need to obtain additional coronavirus testing along with preop lab testing prior to the anticipated dental procedures in the operating room with general anesthesia as per current coronavirus protocols.  2. Discussion of findings with medical team and coordination of future medical and dental care as needed.     Lenn Cal, DDS

## 2019-02-18 NOTE — Progress Notes (Signed)
PROGRESS NOTE    Mark [name]  ZOX:[id number] DOB: 03/03/1976 DOA: 02/13/2019 PCP: Grayce SessionsEdwards, Michelle P, NP   Brief Narrative:  43 year old with history of systolic congestive heart failure, ejection fraction 10%, nonischemic cardiomyopathy, CVA, CKD stage II, tobacco use, medical noncompliance, illicit drug use came to the hospital with complains of progressive shortness of breath.  Found to be in fluid overload secondary to heart failure initially had to be placed on BiPAP.  He was diuresed well with IV Lasix during the hospitalization and cardiology team was following.  During the hospitalization he also reported of dental pain which he has been dealing with for the past several weeks and was diagnosed with dental abscess.  He has been given a round of clindamycin and Bactrim but he states he is allergic to it.  Assessment & Plan:   Principal Problem:   Acute on chronic systolic CHF (congestive heart failure) (HCC) Active Problems:   Tobacco abuse   Tachycardia   CKD (chronic kidney disease) stage 2, GFR 60-89 ml/min   Chronic anemia   Shortness of breath   Acute CHF (congestive heart failure) (HCC)   Allergic reaction caused by a drug   Abnormal liver function  Acute respiratory distress requiring BiPAP Acute congestive heart failure with reduced ejection fraction, 10%, class III - Continue IV Lasix, swtich to po when ok with cardiology.  Aldactone 12.5mg  po daily.  Compressive stockings, strict input and output - Likely not a candidate for advanced cardiac therapy.  Continue medical management.  Issues with noncompliance. -Cardiology team following. Appreciate their input.  -Supportive care.  Wean him off BiPAP as deemed appropriate -We will monitor his renal function.  Mandibular pain with poor dentition, concerning for Dental Abscess -X-rays negative for obvious infection but does have poor dentition needs to follow-up outpatient with dentist. - Given his allergy to  penicillin, clindamycin and Bactrim.  Claims he is having some swelling with Levaquin and Flagyl as well.  I do not see this, I have advised him to take this medications with Benadryl.  Limited antibiotic choices at this point - Spoke with Dr Kristin BruinsKulinski, will perform X rays today. 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  Pharmacy to dose. INR today 2.4.  Questionable facial swelling - States it could be from Bactrim.  Previously has developed angioedema secondary to ACE inhibitor/Entresto.  Continue losartan.  No obvious evidence of mucosal swelling.  This was treated with IV Benadryl and Pepcid in the ER.  Transaminitis, stable -Suspect this is secondary to hepatic congestion from the heart failure.  Overnight provider ordered acute hepatitis panel right upper quadrant ultrasound.  We will follow-up and trend LFTs.  Hyperlipidemia -On statin. I dont see a reason to d/c at this time. Monitor LFTs.   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  Counseled to quit using this.  Tobacco use -Advised to quit plan  COPD appears to be stable  DVT prophylaxis: Coumadin Code Status: Full  Family Communication: None at bedside Disposition Plan: Cont IV Diuresis today uncleared by Cardiology.   Consultants:   Cardiology  Procedures:   None  Antimicrobials:   Flagyl day 3  Levaquin day 3   Subjective: Patient still had pain of his jaw when eating. Some chewing issues as well.  Still have b/l LE pain.  He still thinks he has haditching with Levaquin and Flagyl but not sure.  Review of Systems Otherwise negative except as per HPI, including: General = no fevers, chills, dizziness, malaise, fatigue HEENT/EYES = negative for pain, redness, loss of vision, double vision, blurred vision,  loss of hearing, sore throat, hoarseness, dysphagia Cardiovascular= negative for chest pain, palpitation, murmurs, lower extremity swelling Respiratory/lungs= negative for shortness of breath, cough, hemoptysis, wheezing, mucus production Gastrointestinal= negative for nausea, vomiting,, abdominal pain, melena, hematemesis Genitourinary= negative for Dysuria, Hematuria, Change in Urinary Frequency MSK = Negative for arthralgia, myalgias, Back Pain, Joint swelling  Neurology= Negative for headache, seizures, numbness, tingling  Psychiatry= Negative for anxiety, depression, suicidal and homocidal ideation Allergy/Immunology= Medication/Food allergy as listed  Skin= Negative for  lesions, ulcers   Objective: Vitals:   02/17/19 2231 02/18/19 0645 02/18/19 0650 02/18/19 0807  BP:  109/85    Pulse:  (!) 101    Resp: 18 18    Temp:  98.4 F (36.9 C)    TempSrc:  Oral    SpO2:  100%  95%  Weight:   67 kg   Height:        Intake/Output Summary (Last 24 hours) at 02/18/2019 1040 Last data filed at 02/18/2019 0500 Gross per 24 hour  Intake 480 ml  Output 1550 ml  Net -1070 ml   Filed Weights   02/16/19 1900 02/17/19 0542 02/18/19 0650  Weight: 69.2 kg 69.2 kg 67 kg    Examination: Constitutional: NAD, calm, comfortable Eyes: PERRL, lids and conjunctivae normal ENMT: Mucous membranes are moist. Posterior pharynx clear of any exudate or lesions.Normal dentition.  Neck: normal, supple, no masses, no thyromegaly Respiratory: Some bibasilar crackles Cardiovascular: Regular rate and rhythm, no murmurs / rubs / gallops 2+ bilateral lower extremity pitting edema 2+ pedal pulses. No carotid bruits.  Abdomen: no tenderness, no masses palpated. No hepatosplenomegaly. Bowel sounds positive.  Musculoskeletal: no clubbing / cyanosis. No joint deformity upper and lower extremities. Good ROM, no contractures. Normal muscle tone.  Skin: no rashes, lesions, ulcers. No induration Neurologic: CN 2-12  grossly intact. Sensation intact, DTR normal. Strength 5/5 in all 4.  Psychiatric: Normal judgment and insight. Alert and oriented x 3. Normal mood.      Data Reviewed:   CBC: Recent Labs  Lab 02/13/19 2243 02/14/19 0415 02/15/19 0216 02/16/19 0225 02/17/19 0508 02/18/19 0550  WBC 6.0 5.7 5.2 5.8 4.7 5.0  NEUTROABS 2.9  --   --   --   --   --   HGB 12.7* 12.2* 11.7* 11.5* 10.8* 11.2*  HCT 41.0 38.5* 37.7* 36.4* 34.9* 37.2*  MCV 83.5 82.4 81.1 81.1 81.7 83.0  PLT 211 160 163 169 162 301   Basic Metabolic Panel: Recent Labs  Lab 02/14/19 0415 02/15/19 0216 02/16/19 0225 02/17/19 0508 02/18/19 0550  NA 136 137 137 134* 134*  K 3.6 3.7 4.0 3.7 3.6  CL 99 100 98 96* 97*  CO2 24 28 29 29 28   GLUCOSE 100* 72 109* 125* 87  BUN 28* 29* 29* 29* 26*  CREATININE 1.17  1.25* 1.20 1.18 1.00 0.93  CALCIUM 9.2 8.8* 8.5* 8.5* 8.5*  MG  --  1.7 1.4* 1.8 1.9   GFR: Estimated Creatinine Clearance: 96.1 mL/min (by C-G formula based on SCr of 0.93 mg/dL). Liver Function Tests: Recent Labs  Lab 02/13/19 2243 02/14/19 0415 02/15/19 0216 02/16/19 0225 02/17/19 0508  AST 71* 66* 52* 52* 49*  ALT 32 34 28 27 25   ALKPHOS 128* 116 104 105 104  BILITOT 2.4* 3.0* 2.3* 1.2 1.3*  PROT 7.7 7.6 6.8 6.6 6.5  ALBUMIN 3.3* 3.4* 2.9* 2.8* 2.7*   No results for input(s): LIPASE, AMYLASE in the last 168 hours. No results for input(s): AMMONIA in the last 168 hours. Coagulation Profile: Recent Labs  Lab 02/14/19 0140 02/15/19 0216 02/16/19 0225 02/17/19 0508 02/18/19 0550  INR 2.0* 1.6* 2.0* 2.4* 2.4*   Cardiac Enzymes: No results for input(s): CKTOTAL, CKMB, CKMBINDEX, TROPONINI in the last 168 hours. BNP (last 3 results) No results for input(s): PROBNP in the last 8760 hours. HbA1C: No results for input(s): HGBA1C in the last 72 hours. CBG: No results for input(s): GLUCAP in the last 168 hours. Lipid Profile: No results for input(s): CHOL, HDL, LDLCALC, TRIG, CHOLHDL,  LDLDIRECT in the last 72 hours. Thyroid Function Tests: No results for input(s): TSH, T4TOTAL, FREET4, T3FREE, THYROIDAB in the last 72 hours. Anemia Panel: No results for input(s): VITAMINB12, FOLATE, FERRITIN, TIBC, IRON, RETICCTPCT in the last 72 hours. Sepsis Labs: No results for input(s): PROCALCITON, LATICACIDVEN in the last 168 hours.  Recent Results (from the past 240 hour(s))  SARS Coronavirus 2 (CEPHEID - Performed in [facility] hospital lab), Hosp Order     Status: None   Collection Time: 02/14/19  2:02 AM   Specimen: Nasopharyngeal Swab  Result Value Ref Range Status   SARS Coronavirus 2 NEGATIVE NEGATIVE Final    Comment: (NOTE) If result is NEGATIVE SARS-CoV-2 target nucleic acids are NOT DETECTED. The SARS-CoV-2 RNA is generally detectable in upper and lower  respiratory specimens during the acute phase of infection. The lowest  concentration of SARS-CoV-2 viral copies this assay can detect is 250  copies / mL. A negative result does not preclude SARS-CoV-2 infection  and should not be used as the sole basis for treatment or other  patient management decisions.  A negative result may occur with  improper specimen collection / handling, submission of specimen other  than nasopharyngeal swab, presence of viral mutation(s) within the  areas targeted by this assay, and inadequate number of viral copies  (<250 copies / mL). A negative result must be combined with clinical  observations, patient history, and epidemiological information. If result is POSITIVE SARS-CoV-2 target nucleic acids are DETECTED. The SARS-CoV-2 RNA is generally detectable in upper and lower  respiratory specimens dur ing the acute phase of infection.  Positive  results are indicative of active infection with SARS-CoV-2.  Clinical  correlation with patient history and other diagnostic information is  necessary to determine patient infection status.  Positive results do  not rule out bacterial  infection or co-infection with other viruses. If result is PRESUMPTIVE POSTIVE SARS-CoV-2 nucleic acids MAY BE PRESENT.   A presumptive positive result was obtained on the submitted specimen  and confirmed on repeat testing.  While 2019 novel coronavirus  (SARS-CoV-2) nucleic acids may be present in the submitted sample  additional confirmatory testing may be necessary for epidemiological  and / or clinical management purposes  to differentiate between  SARS-CoV-2 and other Sarbecovirus currently known to infect humans.  If clinically indicated additional testing with an alternate test  methodology [phone] is advised. The SARS-CoV-2 RNA is generally  detectable in upper and lower respiratory sp ecimens during the acute  phase of infection. The expected result is Negative. Fact Sheet for Patients:  BoilerBrush.com.cyhttps://www.fda.gov/media/136312/download Fact Sheet for Healthcare Providers: https://pope.com/https://www.fda.gov/media/136313/download This test is not yet  approved or cleared by the [place] and has been authorized for detection and/or diagnosis of SARS-CoV-2 by FDA under an Emergency Use Authorization (EUA).  This EUA will remain in effect (meaning this test can be used) for the duration of the COVID-19 declaration under Section 564(b)(1) of the Act, 21 U.S.C. section 360bbb-3(b)(1), unless the authorization is terminated or revoked sooner. Performed at [facility], [address], [place], [place] [address]   MRSA PCR Screening     Status: None   Collection Time: 02/14/19  4:01 AM   Specimen: Nasopharyngeal  Result Value Ref Range Status   MRSA by PCR NEGATIVE NEGATIVE Final    Comment:        The GeneXpert MRSA Assay (FDA approved for NASAL specimens only), is one component of a comprehensive MRSA colonization surveillance program. It is not intended to diagnose MRSA infection nor to guide or monitor treatment for MRSA infections. Performed at [facility], [address] [place], [place] [address]          Radiology Studies: No results found.      Scheduled Meds: . aspirin EC  81 mg Oral Daily  . atorvastatin  40 mg Oral q1800  . chlorhexidine  15 mL Mouth Rinse BID  . Chlorhexidine Gluconate Cloth  6 each Topical Daily  . digoxin  0.125 mg Oral Daily  . ferrous sulfate  325 mg Oral Daily  . furosemide  80 mg Intravenous BID  . levofloxacin  500 mg Oral Daily  . losartan  25 mg Oral QHS  . mouth rinse  15 mL Mouth Rinse q12n4p  . metroNIDAZOLE  500 mg Oral Q8H  . mometasone-formoterol  2 puff Inhalation BID  . multivitamin with minerals  1 tablet Oral Daily  . nicotine  14 mg Transdermal Daily  . sodium chloride flush  3 mL Intravenous Q12H  . spironolactone  12.5 mg Oral Daily  . Warfarin - Pharmacist Dosing Inpatient   Does not apply q1800   Continuous Infusions: . sodium chloride       LOS: 4 days   Time spent= 35 mins    Haelee Bolen Joline Maxcy, MD [facility]  If 7PM-7AM, please contact night-coverage [email or web address] 02/18/2019, 10:40 AM

## 2019-02-18 NOTE — Discharge Summary (Signed)
Physician Discharge Summary  Mark [name] ZOX:[id number] DOB: 04/29/1976 DOA: 02/13/2019  PCP: Grayce SessionsEdwards, Michelle P, NP  Admit date: 02/13/2019 Discharge date: 02/18/2019  Admitted From: Home Disposition: Home  Recommendations for Outpatient Follow-up:  1. Follow up with PCP in 1-2 weeks 2. Please obtain BMP/CBC in one week your next doctors visit.  3. Follow-up with outpatient cardiology, appointment has been made 4. Follow-up outpatient with Dr. Kristin BruinsKulinski, dentist, for dental extraction.  This will have to be coordinated with heart failure team as patient is on Coumadin. 5. Oral Levaquin and Flagyl has been prescribed to be taken with Benadryl. 6. Lasix 80 mg orally daily, Aldactone 12.5 mg daily added 7. Not on beta-blocker due to cocaine use   Discharge Condition: Stable CODE STATUS: Full code Diet recommendation: Cardiac diet  Brief/Interim Summary: 43 year old with history of systolic congestive heart failure, ejection fraction 10%, nonischemic cardiomyopathy, CVA, CKD stage II, tobacco use, medical noncompliance, illicit drug use came to the hospital with complains of progressive shortness of breath.  Found to be in fluid overload secondary to heart failure initially had to be placed on BiPAP.  He was diuresed well with IV Lasix during the hospitalization and cardiology team was following.  During the hospitalization he also reported of dental pain which he has been dealing with for the past several weeks and was diagnosed with dental abscess.  He has been given a round of clindamycin and Bactrim but he states he is allergic to it.  He was eventually started on Levaquin and Flagyl to be taken with Benadryl.  He was tolerating this well.  Spoke with dentist, Dr Kristin BruinsKulinski, who recommended patient will need OR tooth extraction.  This will be coordinated by their service with heart failure team.  For this patient may need to be [facility]. During the hospitalization he  was aggressively diuresed, his Lasix was eventually transitioned to 80 mg orally and Aldactone 12.5 mg daily was added by the cardiology team. I provided patient with an extensive education to remain compliant with his medications.  He understands this and will be discharged with recommendations as stated above.    Discharge Diagnoses:  Principal Problem:   Acute on chronic systolic CHF (congestive heart failure) (HCC) Active Problems:   Tobacco abuse   Tachycardia   CKD (chronic kidney disease) stage 2, GFR 60-89 ml/min   Chronic anemia   Shortness of breath   Acute CHF (congestive heart failure) (HCC)   Allergic reaction caused by a drug   Abnormal liver function  Acute respiratory distress requiring BiPAP Acute congestive heart failure with reduced ejection fraction, 10%, class II - Likely not a candidate for advanced cardiac therapy.  Continue medical managemeTransition Lasix 80 mg orally daily.  Aldactone 12.5 mg daily added.  Advised to use compression stockings.  Check his weight daily.  Follow-up outpatient with cardiology.nt.  Issues with noncompliance.   Mandibular pain with poor dentition, concerning for Dental Abscess -X-rays negative for obvious infection but does have poor dentition needs to follow-up outpatient with dentist. - Given his allergy to penicillin, clindamycin and Bactrim.    Will discharge him on Levaquin and Flagyl to be taken for 7 days along with Benadryl to help with the itching.  No obvious evidence of anaphylaxis or allergy noted in the hospital. - Spoke with Dr Kristin BruinsKulinski, XR performed.  He plans on admitting the patient [facility] for ORIF tooth extraction.  Because he is on Coumadin, this will  be coordinated with heart failure team for their clearance and transitioning patient off Coumadin for the procedure.  Apical thrombus - Currently patient is on Coumadin.  Outpatient follow-up appointment has been made by cardiology team for repeat  INR check. Pharmacy to dose. INR today 2.4.  Questionable facial swelling - States it could be from Bactrim.  Previously has developed angioedema secondary to ACE inhibitor/Entresto.  Continue losartan.  No obvious evidence of mucosal swelling.  This was treated with IV Benadryl and Pepcid in the ER.  Transaminitis, stable -Suspect this is secondary to hepatic congestion from the heart failure.  Overnight provider ordered acute hepatitis panel right upper quadrant ultrasound.  We will follow-up and trend LFTs.  Hyperlipidemia -On statin. I dont see a reason to d/c at this time. Monitor LFTs.   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  Counseled to quit using this.  Tobacco use -Advised to quit plan  COPD appears to be stable  DVT prophylaxis: Coumadin Code Status: Full  Family Communication: None at bedside Disposition Plan:  Discharge today.   Consultations:  Cardiology  Dentist, Dr Kristin BruinsKulinski  Subjective: Feels better, wishes to go home.  No complaints. He understands his hospital and follow-up care.  He is very appreciative of his care that was received in the hospital and now tells me that he will do his part to remain compliant with his medications, follow-up with his appointments and stop using any illicit drugs.  Discharge Exam: Vitals:   02/18/19 0645 02/18/19 0807  BP: 109/85   Pulse: (!) 101   Resp: 18   Temp: 98.4 F (36.9 C)   SpO2: 100% 95%   Vitals:   02/17/19 2231 02/18/19 0645 02/18/19 0650 02/18/19 0807  BP:  109/85    Pulse:  (!) 101    Resp: 18 18    Temp:  98.4 F (36.9 C)    TempSrc:  Oral    SpO2:  100%  95%  Weight:   67 kg   Height:        General: Pt is alert, awake, not in acute distress Cardiovascular: RRR, S1/S2 +, no rubs, no gallops, 1+ bilateral lower extremity pitting edema Respiratory: CTA bilaterally, no wheezing, no rhonchi Abdominal:  Soft, NT, ND, bowel sounds + Extremities: no edema, no cyanosis  Discharge Instructions  Discharge Instructions    (HEART FAILURE PATIENTS) Call MD:  Anytime you have any of the following symptoms: 1) 3 pound weight gain in 24 hours or 5 pounds in 1 week 2) shortness of breath, with or without a dry hacking cough 3) swelling in the hands, feet or stomach 4) if you have to sleep on extra pillows at night in order to breathe.   Complete by: As directed    AMB referral to CHF clinic   Complete by: As directed    Diet - low sodium heart healthy   Complete by: As directed    Increase activity slowly   Complete by: As directed      Allergies as of 02/18/2019      Reactions   Hydrocodone Hives   Lisinopril Swelling, Other (See Comments)   Facial swelling/angioedema   Prednisone Shortness Of Breath, Nausea Only, Swelling, Other (See Comments)   Also made chest feel tight and genital area, legs, and face became swollen badly   Bactrim [sulfamethoxazole-trimethoprim]    Clindamycin/lincomycin    "it swells me  up and makes my heart beat fast"   Penicillins Hives, Swelling   Has patient had a PCN reaction causing immediate rash, facial/tongue/throat swelling, SOB or lightheadedness with hypotension: Yes Has patient had a PCN reaction causing severe rash involving mucus membranes or skin necrosis: No Has patient had a PCN reaction that required hospitalization: No Has patient had a PCN reaction occurring within the last 10 years: No If all of the above answers are "NO", then may proceed with Cephalosporin use.      Medication List    TAKE these medications   albuterol 108 (90 Base) MCG/ACT inhaler Commonly known as: VENTOLIN HFA Inhale 1-2 puffs into the lungs every 4 (four) hours as needed for wheezing or shortness of breath.   aspirin 81 MG EC tablet Take 1 tablet (81 mg total) by mouth daily. What changed: how much to take   atorvastatin 40 MG tablet Commonly known as:  LIPITOR Take 40 mg by mouth daily.   budesonide-formoterol 80-4.5 MCG/ACT inhaler Commonly known as: SYMBICORT Inhale 2 puffs into the lungs 2 (two) times daily.   digoxin 0.125 MG tablet Commonly known as: LANOXIN Take 1 tablet (0.125 mg total) by mouth daily.   diphenhydrAMINE 25 MG tablet Commonly known as: Benadryl Allergy Take 1 tablet (25 mg total) by mouth every 8 (eight) hours as needed for allergies (with oral levaquin and flagyl).   ferrous sulfate 325 (65 FE) MG EC tablet Take 325 mg by mouth daily.   furosemide 80 MG tablet Commonly known as: LASIX Take 1 tablet (80 mg total) by mouth daily.   levofloxacin 500 MG tablet Commonly known as: LEVAQUIN Take 1 tablet (500 mg total) by mouth daily for 7 days. Start taking on: February 19, 2019   losartan 25 MG tablet Commonly known as: COZAAR Take 1 tablet (25 mg total) by mouth at bedtime. DISCUSS WITH CARDIOLOGY ABOUT WHEN TO START What changed: when to take this   metroNIDAZOLE 500 MG tablet Commonly known as: FLAGYL Take 1 tablet (500 mg total) by mouth every 8 (eight) hours for 7 days.   multivitamin with minerals Tabs tablet Take 1 tablet by mouth daily.   nicotine 14 mg/24hr patch Commonly known as: NICODERM CQ - dosed in mg/24 hours Place 1 patch (14 mg total) onto the skin daily.   spironolactone 25 MG tablet Commonly known as: ALDACTONE Take 0.5 tablets (12.5 mg total) by mouth daily. Start taking on: February 19, 2019   warfarin 5 MG tablet Commonly known as: COUMADIN Take as directed. If you are unsure how to take this medication, talk to your nurse or doctor. Original instructions: Take 1 tablet daily or as directed by Coumadin Clinic. What changed:   how much to take  how to take this  when to take this  additional instructions      Follow-up Information    Grayce Sessions, NP. Schedule an appointment as soon as possible for a visit in 1 week(s).   Specialty: Internal Medicine Contact  information: [address] [place] [address] [phone]        Kathleene Hazel, MD .   Specialty: Cardiology Contact information: [address] [place] [address] [phone]        Charlynne Pander, DDS Follow up.   Specialty: Dentistry Contact information: [address] [place] [address] [phone]          Allergies  Allergen Reactions  . Hydrocodone Hives  .  Lisinopril Swelling and Other (See Comments)    Facial swelling/angioedema  . Prednisone Shortness Of Breath, Nausea Only, Swelling and Other (See Comments)    Also made chest feel tight and genital area, legs, and face became swollen badly  . Bactrim [Sulfamethoxazole-Trimethoprim]   . Clindamycin/Lincomycin     "it swells me up and makes my heart beat fast"  . Penicillins Hives and Swelling     Has patient had a PCN reaction causing immediate rash, facial/tongue/throat swelling, SOB or lightheadedness with hypotension: Yes Has patient had a PCN reaction causing severe rash involving mucus membranes or skin necrosis: No Has patient had a PCN reaction that required hospitalization: No Has patient had a PCN reaction occurring within the last 10 years: No If all of the above answers are "NO", then may proceed with Cephalosporin use.     You were cared for by a hospitalist during your hospital stay. If you have any questions about your discharge medications or the care you received while you were in the hospital after you are discharged, you can call the unit and asked to speak with the hospitalist on call if the hospitalist that took care of you is not available. Once you are discharged, your primary care physician will handle any further medical issues. Please note that no refills for any discharge medications will be authorized once you are discharged, as it is imperative that you return to your primary care physician (or establish a relationship with a primary  care physician if you do not have one) for your aftercare needs so that they can reassess your need for medications and monitor your lab values.   Procedures/Studies: Dg Mandible 1-3 Views  Result Date: 02/15/2019 CLINICAL DATA:  Mandible pain. EXAM: MANDIBLE - 1-3 VIEW COMPARISON:  CT scan of the maxillofacial structures dated 12/26/2018 FINDINGS: The mandible appears normal including the temporomandibular joints. There multiple missing teeth. Metallic restorations are noted. IMPRESSION: No significant abnormality of the mandible. Electronically Signed   By: Lorriane Shire M.D.   On: 02/15/2019 16:55   Dg Chest 2 View  Result Date: 02/13/2019 CLINICAL DATA:  Allergic reaction. EXAM: CHEST - 2 VIEW COMPARISON:  Chest x-ray dated January 30, 2019 FINDINGS: The heart is significantly enlarged. The lungs are hyperexpanded. There is no pneumothorax. A background of emphysematous changes is suspected bilaterally. There is no acute osseous abnormality. IMPRESSION: No active cardiopulmonary disease. Again noted is significant cardiomegaly. Electronically Signed   By: Constance Holster M.D.   On: 02/13/2019 21:24   Dg Chest 2 View  Result Date: 01/30/2019 CLINICAL DATA:  Shortness of breath.  Dental pain. EXAM: CHEST - 2 VIEW COMPARISON:  January 24, 2019 FINDINGS: Stable cardiomegaly. The hila, mediastinum, lungs, and pleura are otherwise unremarkable. IMPRESSION: No active cardiopulmonary disease. Electronically Signed   By: Dorise Bullion III M.D   On: 01/30/2019 19:40   Dg Chest Port 1 View  Result Date: 01/24/2019 CLINICAL DATA:  Shortness of breath. EXAM: PORTABLE CHEST 1 VIEW COMPARISON:  Chest x-ray dated November 09, 2017. FINDINGS: Stable mild cardiomegaly. Normal mediastinal contours. Normal pulmonary vascularity. The lungs remain hyperinflated with emphysematous changes. No focal consolidation, pleural effusion, or pneumothorax. No acute osseous abnormality. IMPRESSION: 1. No active disease. 2. COPD.  Electronically Signed   By: Titus Dubin M.D.   On: 01/24/2019 20:32   US Abdomen Limited Ruq  Result Date: 02/14/2019 CLINICAL DATA:  Abnormal LFTs. EXAM: ULTRASOUND ABDOMEN LIMITED RIGHT UPPER QUADRANT COMPARISON:  None. FINDINGS: Gallbladder:  There is diffuse gallbladder wall thickening with the gallbladder measuring approximately 14 mm in thickness. The sonographic Eulah Pont sign is negative. There is pericholecystic free fluid. No gallstones are visualized. Common bile duct: Diameter: 2 mm Liver: The liver is heterogeneous. There is no discrete hepatic mass. There appears to be a somewhat nodular contour. There is a small amount of free fluid throughout the abdomen. Portal vein is patent on color Doppler imaging with normal direction of blood flow towards the liver. IMPRESSION: 1. Diffuse gallbladder wall thickening in the absence of gallstones or a positive sonographic Murphy sign. Gallbladder wall thickening is nonspecific and can be seen in patients with ascites, heart failure, or hepatocellular dysfunction. 2. Small volume abdominal ascites. 3. Heterogeneous liver with a somewhat nodular contour is suspicious for cirrhosis. Electronically Signed   By: Katherine Mantle M.D.   On: 02/14/2019 03:42      The results of significant diagnostics from this hospitalization (including imaging, microbiology, ancillary and laboratory) are listed below for reference.     Microbiology: Recent Results (from the past 240 hour(s))  SARS Coronavirus 2 (CEPHEID - Performed in [facility] hospital lab), Hosp Order     Status: None   Collection Time: 02/14/19  2:02 AM   Specimen: Nasopharyngeal Swab  Result Value Ref Range Status   SARS Coronavirus 2 NEGATIVE NEGATIVE Final    Comment: (NOTE) If result is NEGATIVE SARS-CoV-2 target nucleic acids are NOT DETECTED. The SARS-CoV-2 RNA is generally detectable in upper and lower  respiratory specimens during the acute phase of infection. The lowest   concentration of SARS-CoV-2 viral copies this assay can detect is 250  copies / mL. A negative result does not preclude SARS-CoV-2 infection  and should not be used as the sole basis for treatment or other  patient management decisions.  A negative result may occur with  improper specimen collection / handling, submission of specimen other  than nasopharyngeal swab, presence of viral mutation(s) within the  areas targeted by this assay, and inadequate number of viral copies  (<250 copies / mL). A negative result must be combined with clinical  observations, patient history, and epidemiological information. If result is POSITIVE SARS-CoV-2 target nucleic acids are DETECTED. The SARS-CoV-2 RNA is generally detectable in upper and lower  respiratory specimens dur ing the acute phase of infection.  Positive  results are indicative of active infection with SARS-CoV-2.  Clinical  correlation with patient history and other diagnostic information is  necessary to determine patient infection status.  Positive results do  not rule out bacterial infection or co-infection with other viruses. If result is PRESUMPTIVE POSTIVE SARS-CoV-2 nucleic acids MAY BE PRESENT.   A presumptive positive result was obtained on the submitted specimen  and confirmed on repeat testing.  While 2019 novel coronavirus  (SARS-CoV-2) nucleic acids may be present in the submitted sample  additional confirmatory testing may be necessary for epidemiological  and / or clinical management purposes  to differentiate between  SARS-CoV-2 and other Sarbecovirus currently known to infect humans.  If clinically indicated additional testing with an alternate test  methodology [phone]) is advised. The SARS-CoV-2 RNA is generally  detectable in upper and lower respiratory sp ecimens during the acute  phase of infection. The expected result is Negative. Fact Sheet for Patients:  BoilerBrush.com.cy Fact Sheet  for Healthcare Providers: https://pope.com/ This test is not yet approved or cleared by the [place] FDA and has been authorized for detection and/or diagnosis of SARS-CoV-2 by FDA  under an Emergency Use Authorization (EUA).  This EUA will remain in effect (meaning this test can be used) for the duration of the COVID-19 declaration under Section 564(b)(1) of the Act, 21 U.S.C. section 360bbb-3(b)(1), unless the authorization is terminated or revoked sooner. Performed at [facility], [address] [place], [place] [address]   MRSA PCR Screening     Status: None   Collection Time: 02/14/19  4:01 AM   Specimen: Nasopharyngeal  Result Value Ref Range Status   MRSA by PCR NEGATIVE NEGATIVE Final    Comment:        The GeneXpert MRSA Assay (FDA approved for NASAL specimens only), is one component of a comprehensive MRSA colonization surveillance program. It is not intended to diagnose MRSA infection nor to guide or monitor treatment for MRSA infections. Performed at [facility], [address] [place], [place] [address]      Labs: BNP (last 3 results) Recent Labs    12/28/18 0251 01/24/19 1955 02/13/19 2243  BNP 848.5* 2,792.5* 3,902.9*   Basic Metabolic Panel: Recent Labs  Lab 02/14/19 0415 02/15/19 0216 02/16/19 0225 02/17/19 0508 02/18/19 0550  NA 136 137 137 134* 134*  K 3.6 3.7 4.0 3.7 3.6  CL 99 100 98 96* 97*  CO2 24 28 29 29 28   GLUCOSE 100* 72 109* 125* 87  BUN 28* 29* 29* 29* 26*  CREATININE 1.17  1.25* 1.20 1.18 1.00 0.93  CALCIUM 9.2 8.8* 8.5* 8.5* 8.5*  MG  --  1.7 1.4* 1.8 1.9   Liver Function Tests: Recent Labs  Lab 02/13/19 2243 02/14/19 0415 02/15/19 0216 02/16/19 0225 02/17/19 0508  AST 71* 66* 52* 52* 49*  ALT 32 34 28 27 25   ALKPHOS 128* 116 104 105 104  BILITOT 2.4* 3.0* 2.3* 1.2 1.3*  PROT 7.7 7.6 6.8 6.6 6.5  ALBUMIN 3.3* 3.4* 2.9* 2.8* 2.7*   No results  for input(s): LIPASE, AMYLASE in the last 168 hours. No results for input(s): AMMONIA in the last 168 hours. CBC: Recent Labs  Lab 02/13/19 2243 02/14/19 0415 02/15/19 0216 02/16/19 0225 02/17/19 0508 02/18/19 0550  WBC 6.0 5.7 5.2 5.8 4.7 5.0  NEUTROABS 2.9  --   --   --   --   --   HGB 12.7* 12.2* 11.7* 11.5* 10.8* 11.2*  HCT 41.0 38.5* 37.7* 36.4* 34.9* 37.2*  MCV 83.5 82.4 81.1 81.1 81.7 83.0  PLT 211 160 163 169 162 153   Cardiac Enzymes: No results for input(s): CKTOTAL, CKMB, CKMBINDEX, TROPONINI in the last 168 hours. BNP: Invalid input(s): POCBNP CBG: No results for input(s): GLUCAP in the last 168 hours. D-Dimer No results for input(s): DDIMER in the last 72 hours. Hgb A1c No results for input(s): HGBA1C in the last 72 hours. Lipid Profile No results for input(s): CHOL, HDL, LDLCALC, TRIG, CHOLHDL, LDLDIRECT in the last 72 hours. Thyroid function studies No results for input(s): TSH, T4TOTAL, T3FREE, THYROIDAB in the last 72 hours.  Invalid input(s): FREET3 Anemia work up No results for input(s): VITAMINB12, FOLATE, FERRITIN, TIBC, IRON, RETICCTPCT in the last 72 hours. Urinalysis    Component Value Date/Time   COLORURINE STRAW (A) 01/24/2019 1955   APPEARANCEUR CLEAR 01/24/2019 1955   LABSPEC 1.005 01/24/2019 1955   PHURINE 5.0 01/24/2019 1955   GLUCOSEU NEGATIVE 01/24/2019 1955   HGBUR NEGATIVE 01/24/2019 1955   BILIRUBINUR NEGATIVE 01/24/2019 1955   BILIRUBINUR negative 12/01/2017 0910   KETONESUR NEGATIVE 01/24/2019 1955  PROTEINUR NEGATIVE 01/24/2019 1955   UROBILINOGEN 2.0 (A) 12/01/2017 0910   NITRITE NEGATIVE 01/24/2019 1955   LEUKOCYTESUR NEGATIVE 01/24/2019 1955   Sepsis Labs Invalid input(s): PROCALCITONIN,  WBC,  LACTICIDVEN Microbiology Recent Results (from the past 240 hour(s))  SARS Coronavirus 2 (CEPHEID - Performed in [facility] hospital lab), Hosp Order     Status: None   Collection Time: 02/14/19  2:02 AM   Specimen:  Nasopharyngeal Swab  Result Value Ref Range Status   SARS Coronavirus 2 NEGATIVE NEGATIVE Final    Comment: (NOTE) If result is NEGATIVE SARS-CoV-2 target nucleic acids are NOT DETECTED. The SARS-CoV-2 RNA is generally detectable in upper and lower  respiratory specimens during the acute phase of infection. The lowest  concentration of SARS-CoV-2 viral copies this assay can detect is 250  copies / mL. A negative result does not preclude SARS-CoV-2 infection  and should not be used as the sole basis for treatment or other  patient management decisions.  A negative result may occur with  improper specimen collection / handling, submission of specimen other  than nasopharyngeal swab, presence of viral mutation(s) within the  areas targeted by this assay, and inadequate number of viral copies  (<250 copies / mL). A negative result must be combined with clinical  observations, patient history, and epidemiological information. If result is POSITIVE SARS-CoV-2 target nucleic acids are DETECTED. The SARS-CoV-2 RNA is generally detectable in upper and lower  respiratory specimens dur ing the acute phase of infection.  Positive  results are indicative of active infection with SARS-CoV-2.  Clinical  correlation with patient history and other diagnostic information is  necessary to determine patient infection status.  Positive results do  not rule out bacterial infection or co-infection with other viruses. If result is PRESUMPTIVE POSTIVE SARS-CoV-2 nucleic acids MAY BE PRESENT.   A presumptive positive result was obtained on the submitted specimen  and confirmed on repeat testing.  While 2019 novel coronavirus  (SARS-CoV-2) nucleic acids may be present in the submitted sample  additional confirmatory testing may be necessary for epidemiological  and / or clinical management purposes  to differentiate between  SARS-CoV-2 and other Sarbecovirus currently known to infect humans.  If clinically  indicated additional testing with an alternate test  methodology [phone]) is advised. The SARS-CoV-2 RNA is generally  detectable in upper and lower respiratory sp ecimens during the acute  phase of infection. The expected result is Negative. Fact Sheet for Patients:  BoilerBrush.com.cy Fact Sheet for Healthcare Providers: https://pope.com/ This test is not yet approved or cleared by the [place] FDA and has been authorized for detection and/or diagnosis of SARS-CoV-2 by FDA under an Emergency Use Authorization (EUA).  This EUA will remain in effect (meaning this test can be used) for the duration of the COVID-19 declaration under Section 564(b)(1) of the Act, 21 U.S.C. section 360bbb-3(b)(1), unless the authorization is terminated or revoked sooner. Performed at [facility], [address] [place], [place] [address]   MRSA PCR Screening     Status: None   Collection Time: 02/14/19  4:01 AM   Specimen: Nasopharyngeal  Result Value Ref Range Status   MRSA by PCR NEGATIVE NEGATIVE Final    Comment:        The GeneXpert MRSA Assay (FDA approved for NASAL specimens only), is one component of a comprehensive MRSA colonization surveillance program. It is not intended to diagnose MRSA infection nor to guide or monitor treatment for MRSA infections. Performed  at [facility], [address], [place], [place] [address]      Time coordinating discharge:  I have spent 35 minutes face to face with the patient and on the ward discussing the patients care, assessment, plan and disposition with other care givers. >50% of the time was devoted counseling the patient about the risks and benefits of treatment/Discharge disposition and coordinating care.   SIGNED:   Dimple Nanas, MD  [facility] 02/18/2019, 12:05 PM   If 7PM-7AM, please contact night-coverage [email or web address]

## 2019-02-18 NOTE — Progress Notes (Addendum)
Progress Note  Patient Name: Mark [name] Date of Encounter: 02/18/2019  Primary Cardiologist: Verne Carrow, MD / CHF clinic  Subjective   Breathing is better and no edema. Feeling well but concerned about dental plan.  Inpatient Medications    Scheduled Meds: . aspirin EC  81 mg Oral Daily  . atorvastatin  40 mg Oral q1800  . chlorhexidine  15 mL Mouth Rinse BID  . Chlorhexidine Gluconate Cloth  6 each Topical Daily  . digoxin  0.125 mg Oral Daily  . ferrous sulfate  325 mg Oral Daily  . furosemide  80 mg Intravenous BID  . levofloxacin  500 mg Oral Daily  . losartan  25 mg Oral QHS  . mouth rinse  15 mL Mouth Rinse q12n4p  . metroNIDAZOLE  500 mg Oral Q8H  . mometasone-formoterol  2 puff Inhalation BID  . multivitamin with minerals  1 tablet Oral Daily  . nicotine  14 mg Transdermal Daily  . sodium chloride flush  3 mL Intravenous Q12H  . spironolactone  12.5 mg Oral Daily  . Warfarin - Pharmacist Dosing Inpatient   Does not apply q1800   Continuous Infusions: . sodium chloride     PRN Meds: sodium chloride, albuterol, alum & mag hydroxide-simeth, diphenhydrAMINE, hydrALAZINE, hydrocortisone, hydrocortisone cream, lip balm, loratadine, Muscle Rub, phenol, polyethylene glycol, polyvinyl alcohol, senna-docusate, sodium chloride, sodium chloride flush   Vital Signs    Vitals:   02/17/19 2231 02/18/19 0645 02/18/19 0650 02/18/19 0807  BP:  109/85    Pulse:  (!) 101    Resp: 18 18    Temp:  98.4 F (36.9 C)    TempSrc:  Oral    SpO2:  100%  95%  Weight:   67 kg   Height:        Intake/Output Summary (Last 24 hours) at 02/18/2019 0820 Last data filed at 02/18/2019 0500 Gross per 24 hour  Intake 720 ml  Output 2150 ml  Net -1430 ml   Last 3 Weights 02/18/2019 02/17/2019 02/16/2019  Weight (lbs) 147 lb 9.6 oz 152 lb 8 oz 152 lb 8.9 oz  Weight (kg) 66.951 kg 69.174 kg 69.2 kg     Telemetry    NSR - Personally Reviewed  Physical Exam   GEN: No  acute distress, thin HEENT: Normocephalic, atraumatic, sclera non-icteric. Neck: No JVD or bruits. Cardiac: Reg rhythm, borderline tachyy, no murmurs, rubs, or gallops.  Radials/DP/PT 1+ and equal bilaterally.  Respiratory: Clear to auscultation bilaterally. Breathing is unlabored. GI: Soft, nontender, non-distended, BS +x 4. MS: no deformity. Extremities: No clubbing or cyanosis. Trace BLE edema. Distal pedal pulses are 2+ and equal bilaterally. Neuro:  AAOx3. Follows commands. Psych:  Responds to questions appropriately with a normal affect.  Labs    Chemistry Recent Labs  Lab 02/15/19 0216 02/16/19 0225 02/17/19 0508 02/18/19 0550  NA 137 137 134* 134*  K 3.7 4.0 3.7 3.6  CL 100 98 96* 97*  CO2 28 29 29 28   GLUCOSE 72 109* 125* 87  BUN 29* 29* 29* 26*  CREATININE 1.20 1.18 1.00 0.93  CALCIUM 8.8* 8.5* 8.5* 8.5*  PROT 6.8 6.6 6.5  --   ALBUMIN 2.9* 2.8* 2.7*  --   AST 52* 52* 49*  --   ALT 28 27 25   --   ALKPHOS 104 105 104  --   BILITOT 2.3* 1.2 1.3*  --   GFRNONAA >60 >60 >60 >60  GFRAA >60 >60 >60 >60  ANIONGAP 9 10 9 9      Hematology Recent Labs  Lab 02/16/19 0225 02/17/19 0508 02/18/19 0550  WBC 5.8 4.7 5.0  RBC 4.49 4.27 4.48  HGB 11.5* 10.8* 11.2*  HCT 36.4* 34.9* 37.2*  MCV 81.1 81.7 83.0  MCH 25.6* 25.3* 25.0*  MCHC 31.6 30.9 30.1  RDW 17.7* 17.9* 18.4*  PLT 169 162 153    Cardiac EnzymesNo results for input(s): TROPONINI in the last 168 hours. No results for input(s): TROPIPOC in the last 168 hours.   BNP Recent Labs  Lab 02/13/19 2243  BNP 3,902.9*     DDimer No results for input(s): DDIMER in the last 168 hours.   Radiology    No results found.  Cardiac Studies   2D echo 12/25/18 IMPRESSIONS   1. The right ventricle has severely reduced systolic function. The cavity was severely enlarged. There is no increase in right ventricular wall thickness.  2. Left atrial size was mildly dilated.  3. Right atrial size was moderately  dilated.  4. Severely thickened tricuspid valve leaflets.  5. The mitral valve is grossly normal. Mitral valve regurgitation is moderate to severe by color flow Doppler. The MR jet is posteriorly-directed.  6. The tricuspid valve is grossly normal.  7. The pulmonic valve was grossly normal. Pulmonic valve regurgitation is moderate is mild by color flow Doppler.   Patient Profile     43 y.o. male with chronic systolic heart failure, NICM by cath in 2017 (normal cors), LV thrombus, prior stroke, CKD stage 2, asthma, polysubstance abuse, and non-compliance was admitted 06/25 with acute on chronic CHF and possible allergic rxn to Bactrim. He is not on ACEI/Entresto due to h/o angioedema and not on BB due to h/o cocaine. This is his 3rd admission in just a few weeks. He was admitted 12/2018 with HF/respiratory failure in setting of noncompliance, found to have LV thrombus but patient refused to go on Coumadin. 2D Echo 12/25/18 with EF 5-10%, diffuse HK, new large apical thrombus, grade 2 DD, severe RV dysfunction, moderate pulm HTN, moderate-severe MR and severe TR. He was not felt to be a candidate for advanced therapies or inotropes given his ongoing substance abuse and noncompliance - seen by palliative care but patient refused to accept severity of condition. Readmitted 6/4-6/5 with recurrent HF in setting of not taking his medicines. [facility]able to beginning Coumadin. He was readmitted this time 6/24 with possible allergic reaction to Bactrim (rx'd for dental infection) - noted to have SOB, eye swelling and left sided facial swelling. Also found to have recurrent biventricular HF. Losartan continued. IM plans to call inpt dental 6/29 due to mandibular pain concerning for dental abscess.  Assessment & Plan    1. Possible allergic reaction to Bactrim with mandibular pain, concern for dental abscess - per IM notes there is plan to consult Dr. Kristin BruinsKulinski while  inpatient. Abx selection limited given h/o allergic reactions.  2. Acute on chronic severe biventricular heart failure - hsTrop felt c/w demand ischemia from CHF, not felt to represent ACS - not on BB due to ongoing cocaine use. Tolerating current regimen OK but little room to advance regimen.  -6.1L, wt down 7lb which is below prior dc wts. Consider transition to oral Lasix today - will review with MD who has been rounding on him day to day. Per prior notes not a candidate for advanced therapies. Needs ongoing OP f/u with CHF clinic.  3. Polysubstance  abuse - counseled extensively on fatal nature associated with recurrent use. Needs drug rehab resources at dc.  4. LV apical thrombus - Coumadin per pharmacy.  5. CKD stage II - stable, Cr improved.  For questions or updates, please contact [place] Please consult [email or web address] for contact info under Cardiology/STEMI.  Signed, Charlie Pitter, PA-C 02/18/2019, 8:20 AM    The patient was seen, examined and discussed with Melina Copa, PA-C and I agree with the above.   The patient is feeling better, he has diuresed 5 pounds in the last 2 days with decrease of 2 kg, his creatinine is stable, blood pressure is well controlled, he is finally able to see dental consultation today.  I will transition him to Lasix 80 mg daily and spironolactone 12.5 mg daily.  He can be discharged later today.  We will arrange for follow-up in our clinic.  Ena Dawley, MD 02/18/2019

## 2019-02-18 NOTE — Progress Notes (Signed)
Per d/w Dr. Meda Coffee, keep previously arranged f/u as planned (Coumadin 6/30, virtual OV with CHF 7/20). Christophr Calix PA-C

## 2019-02-19 ENCOUNTER — Ambulatory Visit (INDEPENDENT_AMBULATORY_CARE_PROVIDER_SITE_OTHER): Payer: Self-pay | Admitting: *Deleted

## 2019-02-19 ENCOUNTER — Telehealth (HOSPITAL_COMMUNITY): Payer: Self-pay | Admitting: Licensed Clinical Social Worker

## 2019-02-19 ENCOUNTER — Other Ambulatory Visit: Payer: Self-pay

## 2019-02-19 ENCOUNTER — Telehealth (INDEPENDENT_AMBULATORY_CARE_PROVIDER_SITE_OTHER): Payer: Self-pay

## 2019-02-19 DIAGNOSIS — Z5181 Encounter for therapeutic drug level monitoring: Secondary | ICD-10-CM

## 2019-02-19 DIAGNOSIS — I513 Intracardiac thrombosis, not elsewhere classified: Secondary | ICD-10-CM

## 2019-02-19 DIAGNOSIS — I639 Cerebral infarction, unspecified: Secondary | ICD-10-CM

## 2019-02-19 LAB — POCT INR: INR: 1.7 — AB (ref 2.0–3.0)

## 2019-02-19 NOTE — Patient Instructions (Addendum)
Description   Start taking 1 tablet (5mg ) daily except 1/2 tablet (2.5mg ) on Monday, Wednesday, and Friday at 6pm. Please adhere to appointments. Recheck INR in 1 week. Coumadin Clinic#[phone] Main [phone]     Please have the Dentist call Coumadin Clinic [phone] or fax Clearance Form to [phone]

## 2019-02-19 NOTE — Telephone Encounter (Signed)
Transition Care Management Follow-up Telephone Call Date of discharge and from where: 02/18/2019, [facility]   Call placed to patient # [phone], message left requesting a call back to this CM # [phone]

## 2019-02-19 NOTE — Telephone Encounter (Signed)
CSW scheduled transportation for 10am to get patient to 10:30am coumadin appt  CSW will continue to follow and assist as needed  Jorge Ny, [facility] Desk#: [phone] Cell#: [phone]

## 2019-02-20 ENCOUNTER — Telehealth (INDEPENDENT_AMBULATORY_CARE_PROVIDER_SITE_OTHER): Payer: Self-pay

## 2019-02-20 ENCOUNTER — Telehealth (HOSPITAL_COMMUNITY): Payer: Self-pay

## 2019-02-20 NOTE — Telephone Encounter (Signed)
I called Mark [name] to schedule our initial CHP visit. I left a message on his voicemail.

## 2019-02-20 NOTE — Telephone Encounter (Signed)
Transition Care Management Follow-up Telephone Call - attempt # 2 Date of discharge and from where: 02/18/2019, [facility]   Call placed to patient # [phone], message left requesting a call back to this CM # [phone]

## 2019-02-21 ENCOUNTER — Telehealth: Payer: Self-pay | Admitting: Cardiology

## 2019-02-21 NOTE — Telephone Encounter (Signed)
Called in to Dr Ritta Slot office to clarify dental work pt is having done as well as the date.  Office closed at [place] - left message for dentist to call back and provide above information.

## 2019-02-21 NOTE — Telephone Encounter (Signed)
New Message            Patient is calling in to give a Dr.Kulinski name  And # [phone]. He was told to give the # to who he seen on Tuesday, Pls advise.

## 2019-02-26 ENCOUNTER — Ambulatory Visit (INDEPENDENT_AMBULATORY_CARE_PROVIDER_SITE_OTHER): Payer: Self-pay | Admitting: Pharmacist

## 2019-02-26 ENCOUNTER — Telehealth: Payer: Self-pay

## 2019-02-26 ENCOUNTER — Other Ambulatory Visit (HOSPITAL_COMMUNITY): Payer: Self-pay | Admitting: *Deleted

## 2019-02-26 ENCOUNTER — Other Ambulatory Visit: Payer: Self-pay

## 2019-02-26 ENCOUNTER — Other Ambulatory Visit (HOSPITAL_COMMUNITY): Payer: Self-pay

## 2019-02-26 ENCOUNTER — Telehealth (HOSPITAL_COMMUNITY): Payer: Self-pay

## 2019-02-26 ENCOUNTER — Telehealth (HOSPITAL_COMMUNITY): Payer: Self-pay | Admitting: Licensed Clinical Social Worker

## 2019-02-26 DIAGNOSIS — Z5181 Encounter for therapeutic drug level monitoring: Secondary | ICD-10-CM

## 2019-02-26 DIAGNOSIS — I639 Cerebral infarction, unspecified: Secondary | ICD-10-CM

## 2019-02-26 DIAGNOSIS — I513 Intracardiac thrombosis, not elsewhere classified: Secondary | ICD-10-CM

## 2019-02-26 LAB — POCT INR: INR: 2.1 (ref 2.0–3.0)

## 2019-02-26 MED ORDER — FUROSEMIDE 80 MG PO TABS: 80.0000 mg | ORAL_TABLET | Freq: Every day | ORAL | 3 refills | Status: DC

## 2019-02-26 MED FILL — FUROSEMIDE 80 MG TAB: 80 | 30 days supply | Qty: 30 | Fill #0

## 2019-02-26 MED FILL — SPIRONOLACTONE 25 MG TABLET: 25 | 30 days supply | Qty: 15 | Fill #1

## 2019-02-26 NOTE — Patient Instructions (Signed)
Description   Start taking 1/2 tablet daily except 1 tablet on Sundays. Please adhere to appointments. Recheck INR in 1 week. Coumadin Clinic#[phone] Main [phone]

## 2019-02-26 NOTE — Telephone Encounter (Signed)
Per discharge summary: Mandibular pain with poor dentition, concerning for Dental Abscess -X-rays negative for obvious infection but does have poor dentition needs to follow-up outpatient with dentist. -Given his allergy to penicillin, clindamycin and Bactrim.   Will discharge him on Levaquin and Flagyl to be taken for 7 days along with Benadryl to help with the itching.  No obvious evidence of anaphylaxis or allergy noted in the hospital. -Spoke with Dr Enrique Sack, XR performed.  He plans on admitting the patient at [facility] for ORIF tooth extraction.  Because he is on Coumadin, this will be coordinated with heart failure team for their clearance and transitioning patient off Coumadin for the procedure.  Dr Albin Felling LVM needing to know when pt can d/c Coumadin for dental procedure. Route to coumadin clinic for further instructions.

## 2019-02-26 NOTE — Telephone Encounter (Signed)
Patient has history of stroke and was diagnosed with LV thrombus in May 2020. Prefer to continue warfarin for single dental extraction due to patient's elevated cardiac risk off of warfarin. Can try to keep INR on lower end of 2-3 range. Pt has INR check today, current abx will also increase INR - we will adjust accordingly.

## 2019-02-26 NOTE — Telephone Encounter (Signed)
Spoke with Dr Enrique Sack - pt is having 6 teeth extracted, not 1. Discussed patient's elevated risk - do not feel that pt would be compliant with a Lovenox bridge, decided on holding warfarin for 2 days prior to extractions (should drop INR to ~1.5-1.7), they will check INR with pre-op labs and are planning to keep pt for overnight observation. Plan will be to restart warfarin in the evening on procedure day as long as there are no bleeding complications.   Dr Enrique Sack would like pt to be seen by Dr Haroldine Laws to provide cardiac clearance prior to scheduling dental work since pt has a history of medication noncompliance. He is planning to use general anesthesia and wants to make sure this is ok with patient's LVEF of 10-15%.

## 2019-02-26 NOTE — Progress Notes (Signed)
Paramedicine Encounter    Patient ID: Mark [name], male    DOB: 10/20/1975, 43 y.o.   MRN: [id number]    Patient Care Team: Grayce SessionsEdwards, Michelle P, NP as PCP - General (Internal Medicine) Kathleene HazelMcAlhany, Christopher D, MD as PCP - Cardiology (Cardiology)  Patient Active Problem List   Diagnosis Date Noted  . Allergic reaction caused by a drug 02/14/2019  . Abnormal liver function 02/14/2019  . Encounter for therapeutic drug monitoring 02/13/2019  . Encounter to establish care 02/13/2019  . 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 Acute CHF (congestive heart failure) (HCC) 01/24/2019  . Shortness of breath   . Cocaine use   . Acute on chronic systolic CHF (congestive heart failure) (HCC) 12/25/2018  . Demand ischemia (HCC)   . LV (left ventricular) mural thrombus without MI   . Noncompliance   . Chronic anemia   . Acute systolic CHF (congestive heart failure) (HCC) 11/09/2017  . History of right MCA stroke 09/28/2017  . Stroke (HCC) 09/28/2017  . Chronic systolic heart failure (HCC) 01/11/2016  . NICM (nonischemic cardiomyopathy) (HCC) 01/11/2016  . CKD (chronic kidney disease) stage 2, GFR 60-89 ml/min 01/11/2016  . Cocaine abuse (HCC) 01/11/2016  . Chest pain, pleuritic 01/03/2016  . Tobacco abuse 01/03/2016  . Asthma 01/03/2016  . Leg swelling 01/03/2016  . Tachycardia 01/03/2016  . Normocytic anemia 01/03/2016  . Elevated troponin I level 01/03/2016  . Right rib fracture 01/03/2016  . Chest pain     Current Outpatient Medications:  .  aspirin 81 MG EC tablet, Take 1 tablet (81 mg total) by mouth daily. (Patient taking differently: Take 162 mg by mouth daily. ), Disp: 30 tablet, Rfl: 4 .  atorvastatin (LIPITOR) 40 MG tablet, Take 40 mg by mouth daily., Disp: , Rfl:  .  digoxin (LANOXIN) 0.125 MG tablet, Take 1 tablet (0.125 mg total) by mouth daily., Disp: 30 tablet, Rfl: 5 .  diphenhydrAMINE (BENADRYL ALLERGY) 25 MG tablet, Take 1 tablet (25 mg  total) by mouth every 8 (eight) hours as needed for allergies (with oral levaquin and flagyl)., Disp: 30 tablet, Rfl: 0 .  ferrous sulfate 325 (65 FE) MG EC tablet, Take 325 mg by mouth daily., Disp: , Rfl:  .  levofloxacin (LEVAQUIN) 500 MG tablet, Take 1 tablet (500 mg total) by mouth daily for 7 days., Disp: 7 tablet, Rfl: 0 .  losartan (COZAAR) 25 MG tablet, Take 1 tablet (25 mg total) by mouth at bedtime. DISCUSS WITH CARDIOLOGY ABOUT WHEN TO START (Patient taking differently: Take 25 mg by mouth daily. DISCUSS WITH CARDIOLOGY ABOUT WHEN TO START), Disp: 30 tablet, Rfl: 5 .  spironolactone (ALDACTONE) 25 MG tablet, Take 0.5 tablets (12.5 mg total) by mouth daily., Disp: 30 tablet, Rfl: 0 .  warfarin (COUMADIN) 5 MG tablet, Take 1 tablet daily or as directed by Coumadin Clinic. (Patient taking differently: Take 5 mg by mouth daily at 6 PM. ), Disp: 30 tablet, Rfl: 0 .  albuterol (VENTOLIN HFA) 108 (90 Base) MCG/ACT inhaler, Inhale 1-2 puffs into the lungs every 4 (four) hours as needed for wheezing or shortness of breath., Disp: 1 Inhaler, Rfl: 4 .  budesonide-formoterol (SYMBICORT) 80-4.5 MCG/ACT inhaler, Inhale 2 puffs into the lungs 2 (two) times daily. (Patient not taking: Reported on 02/13/2019), Disp: 1 Inhaler, Rfl: 3 .  furosemide (LASIX) 80 MG tablet, Take 1 tablet (80 mg total) by mouth daily., Disp: 30 tablet, Rfl: 3 .  Multiple Vitamin (  MULTIVITAMIN WITH MINERALS) TABS tablet, Take 1 tablet by mouth daily., Disp: , Rfl:  .  nicotine (NICODERM CQ - DOSED IN MG/24 HOURS) 14 mg/24hr patch, Place 1 patch (14 mg total) onto the skin daily. (Patient not taking: Reported on 02/11/2019), Disp: 28 patch, Rfl: 0 Allergies  Allergen Reactions  . Hydrocodone Hives  . Lisinopril Swelling and Other (See Comments)    Facial swelling/angioedema  . Prednisone Shortness Of Breath, Nausea Only, Swelling and Other (See Comments)    Also made chest feel tight and genital area, legs, and face became swollen  badly  . Bactrim [Sulfamethoxazole-Trimethoprim]   . Clindamycin/Lincomycin     "it swells me up and makes my heart beat fast"  . Penicillins Hives and Swelling     Has patient had a PCN reaction causing immediate rash, facial/tongue/throat swelling, SOB or lightheadedness with hypotension: Yes Has patient had a PCN reaction causing severe rash involving mucus membranes or skin necrosis: No Has patient had a PCN reaction that required hospitalization: No Has patient had a PCN reaction occurring within the last 10 years: No If all of the above answers are "NO", then may proceed with Cephalosporin use.      Social History   Socioeconomic History  . Marital status: Divorced    Spouse name: Not on file  . Number of children: Not on file  . Years of education: Not on file  . Highest education level: Not on file  Occupational History  . Not on file  Social Needs  . Financial resource strain: Not on file  . Food insecurity    Worry: Not on file    Inability: Not on file  . Transportation needs    Medical: Not on file    Non-medical: Not on file  Tobacco Use  . Smoking status: Former Smoker    Packs/day: 1.00    Years: 30.00    Pack years: 30.00    Types: Cigarettes    Quit date: 09/27/2017    Years since quitting: 1.4  . Smokeless tobacco: Never Used  Substance and Sexual Activity  . Alcohol use: Yes    Alcohol/week: 5.0 standard drinks    Types: 5 Shots of liquor per week    Frequency: Never    Comment: 5-6 shots of vodka daily; "stopped it all after I had stroke 09/27/2017"  . Drug use: Yes    Types: Cocaine, Marijuana    Comment: 11/09/2017 "none since 09/27/2017"  . Sexual activity: Not Currently  Lifestyle  . Physical activity    Days per week: Not on file    Minutes per session: Not on file  . Stress: Not on file  Relationships  . Social Musician on phone: Not on file    Gets together: Not on file    Attends religious service: Not on file    Active  member of club or organization: Not on file    Attends meetings of clubs or organizations: Not on file    Relationship status: Not on file  . Intimate partner violence    Fear of current or ex partner: Not on file    Emotionally abused: Not on file    Physically abused: Not on file    Forced sexual activity: Not on file  Other Topics Concern  . Not on file  Social History Narrative  . Not on file    Physical Exam Pulmonary:     Effort: Pulmonary effort is normal. No  respiratory distress.     Breath sounds: No wheezing or rales.  Abdominal:     General: There is distension.  Musculoskeletal:        General: Swelling present.     Right lower leg: Edema present.     Left lower leg: Edema present.  Skin:    General: Skin is warm and dry.         Future Appointments  Date Time Provider [place]  03/05/2019 12:30 PM [facility]  03/11/2019 11:00 AM [facility] PA/NP [facility] None     BP 104/60 (BP Location: Left Arm, Patient Position: Sitting, Cuff Size: Normal)   Pulse (!) 56   Temp 98.1 F (36.7 C)   Resp 15   Wt 151 lb 12.8 oz (68.9 kg)   SpO2 97%   BMI 22.42 kg/m   *Initial visit (re-referral)   ATF pt CAO x4 sitting on the couch c/o drowsiness.  He said that he takes benadryl with the antibiotic to prevent itchiness.  Both antibiotics (levofloxacin and metronidazone) was completed today. He's aware of the change in his warfarin dose.  He said that he doesn't have much energy; he denies sob, chest pain and dizziness. Pt has edema noted to his feet and legs; his face and abdomen is also swollen. He hasn't added pillows to sleep; pt is still using 3. He's out of lasix (enough to fill his box today); he doesn't have spirolactone; both should be ready for pick up tomorrow. rx bottles verified and pill box refilled.    He's aware of his upcoming visits and procedure next week.   Medication ordered: community health and  wellness Spirolactone 12.5mg   Lasix  Jaelynne Hockley, EMT Paramedic [phone] 02/26/2019    ACTION: Home visit completed

## 2019-02-26 NOTE — Telephone Encounter (Signed)
Dr Albin Felling, per phone message this am:

## 2019-02-26 NOTE — Telephone Encounter (Signed)

## 2019-02-26 NOTE — Telephone Encounter (Signed)
CSW spoke with pt to confirm need for taxi to get to coumadin appt today.  Pt confirms he needs help with transportation to appt- confirmed pick up time for 10:45am  CSW will continue to follow and assist as needed  Jorge Ny, [facility] Desk#: [phone] Cell#: [phone]

## 2019-02-26 NOTE — Telephone Encounter (Signed)
Mr. Pianka returned my call from earlier today.  He said that he has an doctors appointment this morning, so we scheduled a CHP visit for today around 2:30.

## 2019-02-26 NOTE — Telephone Encounter (Signed)
When is surgery?

## 2019-02-27 NOTE — Telephone Encounter (Signed)
Surgery will not be scheduled until evaluated by DB per Dr Ritta Slot office, changed 7/20 virtual visit to in office visit to be cleared for dental procedure. Called pt and is aware of change, verbalized understanding.

## 2019-03-05 ENCOUNTER — Other Ambulatory Visit: Payer: Self-pay

## 2019-03-05 ENCOUNTER — Telehealth (HOSPITAL_COMMUNITY): Payer: Self-pay | Admitting: Licensed Clinical Social Worker

## 2019-03-05 ENCOUNTER — Ambulatory Visit (INDEPENDENT_AMBULATORY_CARE_PROVIDER_SITE_OTHER): Payer: Self-pay | Admitting: *Deleted

## 2019-03-05 DIAGNOSIS — I513 Intracardiac thrombosis, not elsewhere classified: Secondary | ICD-10-CM

## 2019-03-05 DIAGNOSIS — Z5181 Encounter for therapeutic drug level monitoring: Secondary | ICD-10-CM

## 2019-03-05 DIAGNOSIS — I639 Cerebral infarction, unspecified: Secondary | ICD-10-CM

## 2019-03-05 LAB — POCT INR: INR: 2.5 (ref 2.0–3.0)

## 2019-03-05 MED FILL — !VENTOLIN HFA INHALER: 108 (90 BAS | 25 days supply | Qty: 18 | Fill #0

## 2019-03-05 NOTE — Telephone Encounter (Signed)
CSW set up taxi to take patient to coumadin appointment today- confirmed with patient no other barriers to attending appt.  CSW will continue to follow and assist as needed  Jorge Ny, [facility] Desk#: [phone] Cell#: [phone]

## 2019-03-05 NOTE — Patient Instructions (Signed)
Description   Continue taking 1/2 tablet daily except 1 tablet on Sundays. Please adhere to appointments. Recheck INR in 1 week. Coumadin Clinic#[phone] Main [phone]

## 2019-03-06 ENCOUNTER — Telehealth (INDEPENDENT_AMBULATORY_CARE_PROVIDER_SITE_OTHER): Payer: Self-pay

## 2019-03-06 NOTE — Telephone Encounter (Signed)
As per Abelino Derrick, [facility], they have a conflict of interest with the patient and may need to refer him to another agency.  The patient is aware of the conflict

## 2019-03-08 ENCOUNTER — Other Ambulatory Visit (HOSPITAL_COMMUNITY): Payer: Self-pay

## 2019-03-08 ENCOUNTER — Telehealth (HOSPITAL_COMMUNITY): Payer: Self-pay | Admitting: *Deleted

## 2019-03-08 MED ORDER — POTASSIUM CHLORIDE CRYS ER 20 MEQ PO TBCR: 20.0000 meq | EXTENDED_RELEASE_TABLET | Freq: Every day | ORAL | 0 refills | Status: DC

## 2019-03-08 MED ORDER — METOLAZONE 2.5 MG PO TABS: 2.5000 mg | ORAL_TABLET | Freq: Every day | ORAL | 0 refills | Status: DC

## 2019-03-08 MED FILL — POTASSIUM CHLORIDE CRYS ER: 20 | 30 days supply | Qty: 60 | Fill #0

## 2019-03-08 MED FILL — metOLazone 2.5 MG TABS: 2.5 | 30 days supply | Qty: 30 | Fill #0

## 2019-03-08 NOTE — Telephone Encounter (Signed)
Dee w/ paramedicine called to report 3lb weight gain x1 week, sob, and blee up to knees.  Per Dr.Bensimhon take 2.5mg  of metolazone today and tomorrow with 10meq of potassium and keep appt Monday. Dee aware. Medication sent to outpt pharmacy.

## 2019-03-08 NOTE — Progress Notes (Signed)
Paramedicine Encounter    Patient ID: Mark [name], male    DOB: 03/14/1976, 43 y.o.   MRN: [id number]    Patient Care Team: Grayce SessionsEdwards, Michelle P, NP as PCP - General (Internal Medicine) Kathleene HazelMcAlhany, Christopher D, MD as PCP - Cardiology (Cardiology)  Patient Active Problem List   Diagnosis Date Noted  . Allergic reaction caused by a drug 02/14/2019  . Abnormal liver function 02/14/2019  . Encounter for therapeutic drug monitoring 02/13/2019  . Encounter to establish care 02/13/2019  . 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 Acute CHF (congestive heart failure) (HCC) 01/24/2019  . Shortness of breath   . Cocaine use   . Acute on chronic systolic CHF (congestive heart failure) (HCC) 12/25/2018  . Demand ischemia (HCC)   . LV (left ventricular) mural thrombus without MI   . Noncompliance   . Chronic anemia   . Acute systolic CHF (congestive heart failure) (HCC) 11/09/2017  . History of right MCA stroke 09/28/2017  . Stroke (HCC) 09/28/2017  . Chronic systolic heart failure (HCC) 01/11/2016  . NICM (nonischemic cardiomyopathy) (HCC) 01/11/2016  . CKD (chronic kidney disease) stage 2, GFR 60-89 ml/min 01/11/2016  . Cocaine abuse (HCC) 01/11/2016  . Chest pain, pleuritic 01/03/2016  . Tobacco abuse 01/03/2016  . Asthma 01/03/2016  . Leg swelling 01/03/2016  . Tachycardia 01/03/2016  . Normocytic anemia 01/03/2016  . Elevated troponin I level 01/03/2016  . Right rib fracture 01/03/2016  . Chest pain     Current Outpatient Medications:  .  aspirin 81 MG EC tablet, Take 1 tablet (81 mg total) by mouth daily. (Patient taking differently: Take 162 mg by mouth daily. ), Disp: 30 tablet, Rfl: 4 .  atorvastatin (LIPITOR) 40 MG tablet, Take 40 mg by mouth daily., Disp: , Rfl:  .  digoxin (LANOXIN) 0.125 MG tablet, Take 1 tablet (0.125 mg total) by mouth daily., Disp: 30 tablet, Rfl: 5 .  ferrous sulfate 325 (65 FE) MG EC tablet, Take 325 mg by mouth daily.,  Disp: , Rfl:  .  furosemide (LASIX) 80 MG tablet, Take 1 tablet (80 mg total) by mouth daily., Disp: 30 tablet, Rfl: 3 .  losartan (COZAAR) 25 MG tablet, Take 1 tablet (25 mg total) by mouth at bedtime. DISCUSS WITH CARDIOLOGY ABOUT WHEN TO START (Patient taking differently: Take 25 mg by mouth daily. DISCUSS WITH CARDIOLOGY ABOUT WHEN TO START), Disp: 30 tablet, Rfl: 5 .  potassium chloride SA (K-DUR) 20 MEQ tablet, Take 1 tablet (20 mEq total) by mouth daily. Take 40meq with Metolazone., Disp: 60 tablet, Rfl: 0 .  spironolactone (ALDACTONE) 25 MG tablet, Take 0.5 tablets (12.5 mg total) by mouth daily., Disp: 30 tablet, Rfl: 0 .  warfarin (COUMADIN) 5 MG tablet, Take 1 tablet daily or as directed by Coumadin Clinic. (Patient taking differently: Take 5 mg by mouth daily at 6 PM. ), Disp: 30 tablet, Rfl: 0 .  albuterol (VENTOLIN HFA) 108 (90 Base) MCG/ACT inhaler, Inhale 1-2 puffs into the lungs every 4 (four) hours as needed for wheezing or shortness of breath., Disp: 1 Inhaler, Rfl: 4 .  budesonide-formoterol (SYMBICORT) 80-4.5 MCG/ACT inhaler, Inhale 2 puffs into the lungs 2 (two) times daily. (Patient not taking: Reported on 02/13/2019), Disp: 1 Inhaler, Rfl: 3 .  diphenhydrAMINE (BENADRYL ALLERGY) 25 MG tablet, Take 1 tablet (25 mg total) by mouth every 8 (eight) hours as needed for allergies (with oral levaquin and flagyl)., Disp: 30 tablet, Rfl: 0 .  metolazone (ZAROXOLYN) 2.5 MG tablet, Take 1 tablet (2.5 mg total) by mouth daily. Take only as directed by the CHF clinic, Disp: 30 tablet, Rfl: 0 .  Multiple Vitamin (MULTIVITAMIN WITH MINERALS) TABS tablet, Take 1 tablet by mouth daily., Disp: , Rfl:  .  nicotine (NICODERM CQ - DOSED IN MG/24 HOURS) 14 mg/24hr patch, Place 1 patch (14 mg total) onto the skin daily. (Patient not taking: Reported on 02/11/2019), Disp: 28 patch, Rfl: 0 Allergies  Allergen Reactions  . Hydrocodone Hives  . Lisinopril Swelling and Other (See Comments)    Facial  swelling/angioedema  . Prednisone Shortness Of Breath, Nausea Only, Swelling and Other (See Comments)    Also made chest feel tight and genital area, legs, and face became swollen badly  . Bactrim [Sulfamethoxazole-Trimethoprim]   . Clindamycin/Lincomycin     "it swells me up and makes my heart beat fast"  . Penicillins Hives and Swelling     Has patient had a PCN reaction causing immediate rash, facial/tongue/throat swelling, SOB or lightheadedness with hypotension: Yes Has patient had a PCN reaction causing severe rash involving mucus membranes or skin necrosis: No Has patient had a PCN reaction that required hospitalization: No Has patient had a PCN reaction occurring within the last 10 years: No If all of the above answers are "NO", then may proceed with Cephalosporin use.      Social History   Socioeconomic History  . Marital status: Divorced    Spouse name: Not on file  . Number of children: Not on file  . Years of education: Not on file  . Highest education level: Not on file  Occupational History  . Not on file  Social Needs  . Financial resource strain: Not on file  . Food insecurity    Worry: Not on file    Inability: Not on file  . Transportation needs    Medical: Not on file    Non-medical: Not on file  Tobacco Use  . Smoking status: Former Smoker    Packs/day: 1.00    Years: 30.00    Pack years: 30.00    Types: Cigarettes    Quit date: 09/27/2017    Years since quitting: 1.4  . Smokeless tobacco: Never Used  Substance and Sexual Activity  . Alcohol use: Yes    Alcohol/week: 5.0 standard drinks    Types: 5 Shots of liquor per week    Frequency: Never    Comment: 5-6 shots of vodka daily; "stopped it all after I had stroke 09/27/2017"  . Drug use: Yes    Types: Cocaine, Marijuana    Comment: 11/09/2017 "none since 09/27/2017"  . Sexual activity: Not Currently  Lifestyle  . Physical activity    Days per week: Not on file    Minutes per session: Not on file   . Stress: Not on file  Relationships  . Social Musician on phone: Not on file    Gets together: Not on file    Attends religious service: Not on file    Active member of club or organization: Not on file    Attends meetings of clubs or organizations: Not on file    Relationship status: Not on file  . Intimate partner violence    Fear of current or ex partner: Not on file    Emotionally abused: Not on file    Physically abused: Not on file    Forced sexual activity: Not on file  Other Topics  Concern  . Not on file  Social History Narrative  . Not on file    Physical Exam Pulmonary:     Effort: Pulmonary effort is normal. No respiratory distress.     Breath sounds: No wheezing or rales.  Musculoskeletal:        General: Swelling present.     Right lower leg: Edema present.     Left lower leg: Edema present.     Comments: +3 edema noted to feet to mid leg/+ 2 edema noted to the knee  Skin:    General: Skin is warm and dry.         Future Appointments  Date Time Provider [place]  03/11/2019 11:00 AM [facility] PA/NP [facility] None  03/11/2019 12:30 PM [facility]     BP 106/78 (BP Location: Left Arm, Patient Position: Sitting, Cuff Size: Normal)   Pulse 93   Temp (!) 97.5 F (36.4 C)   Wt 154 lb (69.9 kg)   SpO2 98%   BMI 22.74 kg/m   Weight yesterday-didn't weigh Last visit weight-151  ATF pt CAO x4 sitting on the couch. He said that his nose is starting to run again and he believes that the infection has returned.  He said that he completed the antibiotic last week.  Mr. Wint has edema noted to both legs up to the knees.  He took his medications for the week. Last week was our first [facility] visit, he was out several medications that he started back on last week.  Pt said that he's eating a lot of sandwiches because his girlfriend hasn't been home to cook.  He denies chest pain and dizziness.  rx bottles  verified.  Dr. Missy Sabins prescribed him metolazone 2.5 mg and potassium 40 meq for today and tomorrow.  Pt verbalizes that he understands how to take these medications in addition to the regular daily meds.    Medication ordered: Metolazone Potassium  Mark [name], EMT Paramedic [phone] 03/08/2019    ACTION: Home visit completed

## 2019-03-08 NOTE — Progress Notes (Signed)
I picked up metolazone and potassium from Mark [name] out pt pharmacy.   Mark [name] didn't come to the door or answer my calls @  3:00. I returned about 5 oclock, he answered the door and again verbalized his understanding on how to take the meds.

## 2019-03-11 ENCOUNTER — Ambulatory Visit (HOSPITAL_COMMUNITY)
Admission: RE | Admit: 2019-03-11 | Discharge: 2019-03-11 | Disposition: A | Discharge status: Home / Self Care | Payer: Medicaid Other | Source: Ambulatory Visit | Attending: Internal Medicine | Admitting: Internal Medicine

## 2019-03-11 ENCOUNTER — Other Ambulatory Visit: Payer: Self-pay

## 2019-03-11 ENCOUNTER — Ambulatory Visit (INDEPENDENT_AMBULATORY_CARE_PROVIDER_SITE_OTHER): Payer: Self-pay | Admitting: *Deleted

## 2019-03-11 ENCOUNTER — Telehealth (HOSPITAL_COMMUNITY): Payer: Self-pay

## 2019-03-11 ENCOUNTER — Telehealth (HOSPITAL_COMMUNITY): Payer: Self-pay | Admitting: Licensed Clinical Social Worker

## 2019-03-11 ENCOUNTER — Encounter (HOSPITAL_COMMUNITY): Payer: Self-pay

## 2019-03-11 VITALS — BP 96/68 | HR 103 | Wt 140.8 lb

## 2019-03-11 DIAGNOSIS — Z5181 Encounter for therapeutic drug level monitoring: Secondary | ICD-10-CM

## 2019-03-11 DIAGNOSIS — Z8673 Personal history of transient ischemic attack (TIA), and cerebral infarction without residual deficits: Secondary | ICD-10-CM | POA: Diagnosis not present

## 2019-03-11 DIAGNOSIS — I5082 Biventricular heart failure: Secondary | ICD-10-CM | POA: Diagnosis not present

## 2019-03-11 DIAGNOSIS — Z79899 Other long term (current) drug therapy: Secondary | ICD-10-CM | POA: Diagnosis not present

## 2019-03-11 DIAGNOSIS — I428 Other cardiomyopathies: Secondary | ICD-10-CM | POA: Diagnosis not present

## 2019-03-11 DIAGNOSIS — I5022 Chronic systolic (congestive) heart failure: Secondary | ICD-10-CM | POA: Insufficient documentation

## 2019-03-11 DIAGNOSIS — Z8249 Family history of ischemic heart disease and other diseases of the circulatory system: Secondary | ICD-10-CM | POA: Diagnosis not present

## 2019-03-11 DIAGNOSIS — Z720 Tobacco use: Secondary | ICD-10-CM

## 2019-03-11 DIAGNOSIS — Z87891 Personal history of nicotine dependence: Secondary | ICD-10-CM | POA: Diagnosis not present

## 2019-03-11 DIAGNOSIS — K0530 Chronic periodontitis, unspecified: Secondary | ICD-10-CM | POA: Insufficient documentation

## 2019-03-11 DIAGNOSIS — I513 Intracardiac thrombosis, not elsewhere classified: Secondary | ICD-10-CM | POA: Insufficient documentation

## 2019-03-11 DIAGNOSIS — J449 Chronic obstructive pulmonary disease, unspecified: Secondary | ICD-10-CM | POA: Insufficient documentation

## 2019-03-11 DIAGNOSIS — Z7982 Long term (current) use of aspirin: Secondary | ICD-10-CM | POA: Insufficient documentation

## 2019-03-11 DIAGNOSIS — Z7901 Long term (current) use of anticoagulants: Secondary | ICD-10-CM | POA: Insufficient documentation

## 2019-03-11 DIAGNOSIS — Z01810 Encounter for preprocedural cardiovascular examination: Secondary | ICD-10-CM

## 2019-03-11 DIAGNOSIS — I639 Cerebral infarction, unspecified: Secondary | ICD-10-CM

## 2019-03-11 DIAGNOSIS — M7989 Other specified soft tissue disorders: Secondary | ICD-10-CM | POA: Insufficient documentation

## 2019-03-11 DIAGNOSIS — F1411 Cocaine abuse, in remission: Secondary | ICD-10-CM

## 2019-03-11 DIAGNOSIS — N182 Chronic kidney disease, stage 2 (mild): Secondary | ICD-10-CM | POA: Insufficient documentation

## 2019-03-11 LAB — POCT INR: INR: 1.7 — AB (ref 2.0–3.0)

## 2019-03-11 MED ORDER — SPIRONOLACTONE 25 MG PO TABS: 25.0000 mg | ORAL_TABLET | Freq: Every day | ORAL | 6 refills | Status: DC

## 2019-03-11 MED ORDER — LEVOFLOXACIN 500 MG PO TABS: 500.0000 mg | ORAL_TABLET | Freq: Every day | ORAL | 0 refills | Status: DC

## 2019-03-11 MED FILL — levoFLOXacin 500 MG TABS: 500 | 7 days supply | Qty: 7 | Fill #0

## 2019-03-11 NOTE — Patient Instructions (Addendum)
Description   Starting Levaquin 500mg  Daily today. Continue taking 1/2 tablet daily except 1 tablet on Sundays. Please adhere to appointments. Recheck INR in 1 week. Coumadin Clinic#[phone] Main [phone]

## 2019-03-11 NOTE — Telephone Encounter (Signed)
CSW set up taxi to get patient to coumadin appt following his heart failure appt.  CSW will continue to follow and assist as needed  Jorge Ny, [place] [facility] Desk#: [phone] Cell#: [phone]

## 2019-03-11 NOTE — Progress Notes (Signed)
PCP:  Grayce SessionsEdwards, Michelle P, NP          Cardiologist:  Verne Carrowhristopher McAlhany, MD Primary HF: Dr Gala RomneyBensimhon  HPI: Mark ScottJeremie C [name] is a 43 y.o. male with a history of  significant forchronic systolic CHF (EF 04-54%10-15% with severe diffuse hypokinesis 12/2018,NICM by cardiac cath 2017, history of right MCA CVA 2019, asthma, and polysubstance use.  In 2017 he was diagnosed with new onset acute systolic heart failure. Cath at that time showed normal coronaries. He was started on HF meds. Unfortunately he was incarcerated 14 months until late 2018.    Admitted 12/2018 with increased dyspnea. He had run out of medications.  ECHO completed 12/2018 showed severely reduced LV/RVEF 5-10% with large apical thrombus and grade II DD. At that time he refused to go on coumadin.   [facility] with increased dyspnea. UDS was + for cocaine. Diuresed with IV lasix. He had run out of his Hf medications. He was restarted back on HF medications.  Admitted  02/13/19 through 02/18/19 with A/C systolic heart failure. He also had dental pain and was placed on antibiotics. Dr Kristin BruinsKulinski consulted. Planning for  multiple teeth extractions with general anesthesia.    Today he return for HF follow up and clearance for dental surgery. Overall feeling fair. Complaining of mouth pain. Has good days and bad days. Having a hard time moving around the house. SOB with exertion. On good days can walk about a block on bad days is SOB at rest.  Denies PND/Orthopnea. No bleeding issues. Appetite ok. No fever or chills. Having leg swelling. Taking all medications. Smokes 3-4 cigarettes per day.  Says he has given up drugs.   Cardiac Studies  ECHO 12/2018 EF 10-15% severely reduced RV and new apical thrombus.  ECHO 2019 EF 10-15%  ROS: All systems negative except as listed in HPI, PMH and Problem List.  SH:  Social History   Socioeconomic History  . Marital status: Divorced    Spouse name: Not on file  . Number of children:  Not on file  . Years of education: Not on file  . Highest education level: Not on file  Occupational History  . Not on file  Social Needs  . Financial resource strain: Not on file  . Food insecurity    Worry: Not on file    Inability: Not on file  . Transportation needs    Medical: Not on file    Non-medical: Not on file  Tobacco Use  . Smoking status: Former Smoker    Packs/day: 1.00    Years: 30.00    Pack years: 30.00    Types: Cigarettes    Quit date: 09/27/2017    Years since quitting: 1.4  . Smokeless tobacco: Never Used  Substance and Sexual Activity  . Alcohol use: Yes    Alcohol/week: 5.0 standard drinks    Types: 5 Shots of liquor per week    Frequency: Never    Comment: 5-6 shots of vodka daily; "stopped it all after I had stroke 09/27/2017"  . Drug use: Yes    Types: Cocaine, Marijuana    Comment: 11/09/2017 "none since 09/27/2017"  . Sexual activity: Not Currently  Lifestyle  . Physical activity    Days per week: Not on file    Minutes per session: Not on file  . Stress: Not on file  Relationships  . Social Musicianconnections    Talks on phone: Not on file    Gets together: Not  on file    Attends religious service: Not on file    Active member of club or organization: Not on file    Attends meetings of clubs or organizations: Not on file    Relationship status: Not on file  . Intimate partner violence    Fear of current or ex partner: Not on file    Emotionally abused: Not on file    Physically abused: Not on file    Forced sexual activity: Not on file  Other Topics Concern  . Not on file  Social History Narrative  . Not on file    FH:  Family History  Problem Relation Age of Onset  . Cardiomyopathy Father        Reports his father has an LVAD  . Heart failure Father   . Hypertension Father   . Heart disease Maternal Grandmother        had a whole in her heart  . Deep vein thrombosis Neg Hx     Past Medical History:  Diagnosis Date  . Asthma   .  Chronic systolic CHF (congestive heart failure) (HCC)   . Cigarette smoker   . CKD (chronic kidney disease), stage II    /notes 10/01/2017  . COPD (chronic obstructive pulmonary disease) (HCC) 10/21/2017   on CT scan chest  . History of echocardiogram    a. Echo 5/17 - EF 20-25%, severe diff HK, restrictive physiology, mild to mod MR, severe reduced RVSF, mod RVE, mild RAE, mod TR, PASP 48 mmHg  . Hx of cardiac cath    a. LHC 5/17 - normal coronary arteries. PA 45/25, mean 33, PCWP mean 18  . NICM (nonischemic cardiomyopathy) (HCC)   . Stroke (HCC) 09/27/2017   "was weak on my left side; I'm fully recovered" (11/09/2017)  . Substance abuse (HCC)    cocaine, marijuana    Current Outpatient Medications  Medication Sig Dispense Refill  . albuterol (VENTOLIN HFA) 108 (90 Base) MCG/ACT inhaler Inhale 1-2 puffs into the lungs every 4 (four) hours as needed for wheezing or shortness of breath. 1 Inhaler 4  . aspirin 81 MG EC tablet Take 1 tablet (81 mg total) by mouth daily. 30 tablet 4  . budesonide-formoterol (SYMBICORT) 80-4.5 MCG/ACT inhaler Inhale 2 puffs into the lungs 2 (two) times daily. 1 Inhaler 3  . digoxin (LANOXIN) 0.125 MG tablet Take 1 tablet (0.125 mg total) by mouth daily. 30 tablet 5  . diphenhydrAMINE (BENADRYL ALLERGY) 25 MG tablet Take 1 tablet (25 mg total) by mouth every 8 (eight) hours as needed for allergies (with oral levaquin and flagyl). 30 tablet 0  . ferrous sulfate 325 (65 FE) MG EC tablet Take 325 mg by mouth daily.    . furosemide (LASIX) 80 MG tablet Take 1 tablet (80 mg total) by mouth daily. 30 tablet 3  . Multiple Vitamin (MULTIVITAMIN WITH MINERALS) TABS tablet Take 1 tablet by mouth daily.    . nicotine (NICODERM CQ - DOSED IN MG/24 HOURS) 14 mg/24hr patch Place 1 patch (14 mg total) onto the skin daily. 28 patch 0  . potassium chloride SA (K-DUR) 20 MEQ tablet Take 1 tablet (20 mEq total) by mouth daily. Take956 <(4Marland Kitch<MEASUREMENT19569M4Marland Kitch<M956EA(90Marland Kitch<MEASUREMENT119956Ma(2M956ar5Ma956rl(3Ma956rl(70MarlandMarland Kitch<MEASUREM956EN(3Marland 956Ki(9Marland 956Ki9Marland Kitch<MEA956SU(2Marland Kitch<MEASUR956EM3Marland Kitc956h<(31Marland956 K8Marland Kitch<MEASUREMENT119Marland Kitchen1Da40.9lKaiser FndDanel657[place]8CentrGames develoMarland Kitchen469569<MEASUREMENT119Da40.9Danel657Games develo59<ME956AS3Marland Kitch<MEASUREMENT956199Marland Kitc956h<4Marland Kitch<MEASUREMENT119Ma956rl(51Marland Ki956tMarland Kitch<MEASUREM956EN(7Marland Kitc956h<3Marland Kitch<MEASUREMENT119M956aMarland Kitch<956ME8Marland K956it7Marland Kitch<MEASUREMENT119956Ma9Marland Kitch<MEASUREMENT119Da40.9Danel657Games develo9567Elenora Gamma.9l[facility] Danel657[place]8Rog[employer]ames develoMarland Kitchen434 3 g total) by mouth daily. 30 tablet 0  . warfarin (COUMADIN) 5 MG tablet Take 1 tablet daily or as directed by Coumadin Clinic. (Patient taking differently: Take 5 mg  by mouth daily at 6 PM. ) 30 tablet 0  . atorvastatin (LIPITOR) 40 MG tablet Take 40 mg by mouth daily.     No current facility-administered medications for this encounter.     Vitals:   03/11/19 1050  BP: 96/68  Pulse: (!) 103  SpO2: 94%  Weight: 63.9 kg (140 lb 12.8 oz)    PHYSICAL EXAM: General:  No resp difficulty HEENT: normal Neck: supple. JVP 9-10 . Carotids 2+ bilaterally; no bruits. No lymphadenopathy or thryomegaly appreciated. Cor: PMI normal. Regular tachy. No rubs. MR 2/6  +S3  Lungs: clear Abdomen: soft, nontender, nondistended. No hepatosplenomegaly. No bruits or masses. Good bowel sounds. Extremities: Cool lower extremities. no cyanosis, clubbing, rash, R and LLE tr-1+ edema Neuro: alert & orientedx3, cranial nerves grossly intact. Moves all 4 extremities w/o difficulty. Affect pleasant.  ASSESSMENT & PLAN: 1. Chronic Biventricular Heart Failure ECHO 12/2018 EF 10-15% Severely reduced RV.  NYHA IIIb. Volume status elevated. Increase lasix to 80 mg twice a day.   - No bb with low output and recent cocaine abuse.  - Continue digoxin 0.125 mg daily.  - Increase spironolactone to 25 mg daily. Check BMET in 7 days.  -SBP low. Hold off on losartan. Keep off for now. No entresto with h/o angioedema.    2. Apical Thrombus Identified on ECHO 12/2018. At that time he refused to take coumadin. Back on coumadin. INR followed at the coumadin clinic.   3. H/O MCA CVA 2019  Continue  atorvastatin 40 mg daily. Continue aspirin 81 mg daily.   4. Cocaine Abuse Discussed cocaine cessation.   5. Social  He can't pay for medications and does not have transportation to appointments. HFSW following. Continue HF Paramedicine  6. Tobacco Abuse  Discussed cessation.   7. Periodontitis Followed by Dr Enrique Sack and needs multiple teeth extracted. Having increased drainage. Give another 7 days of levaquin 500 mg daily.  Per Dr Haroldine Laws he will need hospital admit prior to dental procedure for RHC to optimize prior to surgery. Concerned he has low output heart failure. Not a candidate for transplant with tobacco abuse. Not sure he would be a candidate for advanced therapies.   Follow up in 2 weeks.    Amy Clegg NP-C  11:38 AM   Patient seen and examined with Darrick Grinder, NP. We discussed all aspects of the encounter. I agree with the assessment and plan as stated above.   43 y/o male with severe systolic HF due to NICM likely polysubstance abuse. Recent echo reviewed personally with LVEF 5-10% and severe RV dysfunction. Now with NYHA IIIB-IV symptoms and mild volume overload.   On exam BP is low and he is tachycardic with prominent s3. Extremities with tr-1+ edema and cool.   We had a long talk about his situation and his high risk for mortality over the next 6 months particularly if he is not complaint with meds. We will need to follow closely and titrate meds as possible. Unfortunately, not currently a candidate for advanced therapies with social situation and RV dysfunction. Will add spiro today.   As far as pre-op clearance, I think we absolutely need to avoid GA right now. I will talk to Dr. Enrique Sack. If anything can be done under local anesthesia then I think we should do that and defer other work for later. If absolutely needs to have GA now, he will need to be admitted, have a RHC and tuned up with IV inotropes prior to going  to the OR.   Total time independently spent 45 minutes. Over half that time spent discussing above.   Arvilla Meres, MD  10:14 PM

## 2019-03-11 NOTE — Patient Instructions (Addendum)
INCREASE Spironolactone to 25mg  (1 tab) daily  START Levaquin 500mg  (1 tab) daily for 7 days.  Your physician recommends that you schedule a follow-up appointment in: 2 weeks with Nurse Practitioner  At the [facility], you and your health needs are our priority. As part of our continuing mission to provide you with exceptional heart care, we have created designated Provider Care Teams. These Care Teams include your primary Cardiologist (physician) and Advanced Practice Providers (APPs- Physician Assistants and Nurse Practitioners) who all work together to provide you with the care you need, when you need it.   You may see any of the following providers on your designated Care Team at your next follow up: Marland Kitchen Dr Glori Bickers . Dr Loralie Champagne . Darrick Grinder, NP

## 2019-03-13 ENCOUNTER — Telehealth: Payer: Self-pay

## 2019-03-13 NOTE — Telephone Encounter (Signed)

## 2019-03-14 ENCOUNTER — Telehealth: Payer: Self-pay

## 2019-03-14 NOTE — Telephone Encounter (Signed)
This CM spoke to Tammy Sours, LCSW and informed her that the patient still needs to schedule a follow up appointment with PCP. She said that she has frequent contact with the patient and will notify him.

## 2019-03-15 ENCOUNTER — Ambulatory Visit (INDEPENDENT_AMBULATORY_CARE_PROVIDER_SITE_OTHER): Payer: Medicaid Other | Admitting: *Deleted

## 2019-03-15 ENCOUNTER — Telehealth: Payer: Self-pay | Admitting: *Deleted

## 2019-03-15 ENCOUNTER — Other Ambulatory Visit: Payer: Self-pay

## 2019-03-15 ENCOUNTER — Telehealth (HOSPITAL_COMMUNITY): Payer: Self-pay | Admitting: Licensed Clinical Social Worker

## 2019-03-15 DIAGNOSIS — Z5181 Encounter for therapeutic drug level monitoring: Secondary | ICD-10-CM | POA: Diagnosis not present

## 2019-03-15 DIAGNOSIS — I513 Intracardiac thrombosis, not elsewhere classified: Secondary | ICD-10-CM

## 2019-03-15 LAB — POCT INR: INR: 1.5 — AB (ref 2.0–3.0)

## 2019-03-15 NOTE — Telephone Encounter (Signed)

## 2019-03-15 NOTE — Patient Instructions (Signed)
Description    Take 1 tablet today, then continue taking 1/2 tablet daily except 1 tablet on Sundays. Please adhere to appointments. Recheck INR in  03/19/2019 week. Coumadin Clinic#[phone] Main [phone]

## 2019-03-15 NOTE — Telephone Encounter (Signed)
Pt has coumadin appt today and needs transportation- taxi set up to take him to appt  Pt also needs follow up with [place] to get appt scheduled  CSW will continue to follow and assist as needed  Jorge Ny, [place] [facility] Desk#: [phone] Cell#: [phone]

## 2019-03-19 ENCOUNTER — Telehealth (HOSPITAL_COMMUNITY): Payer: Self-pay | Admitting: Licensed Clinical Social Worker

## 2019-03-19 ENCOUNTER — Other Ambulatory Visit: Payer: Self-pay

## 2019-03-19 ENCOUNTER — Ambulatory Visit (INDEPENDENT_AMBULATORY_CARE_PROVIDER_SITE_OTHER): Payer: Medicaid Other | Admitting: *Deleted

## 2019-03-19 DIAGNOSIS — Z5181 Encounter for therapeutic drug level monitoring: Secondary | ICD-10-CM

## 2019-03-19 DIAGNOSIS — I639 Cerebral infarction, unspecified: Secondary | ICD-10-CM | POA: Diagnosis not present

## 2019-03-19 DIAGNOSIS — I513 Intracardiac thrombosis, not elsewhere classified: Secondary | ICD-10-CM

## 2019-03-19 LAB — POCT INR: INR: 1.2 — AB (ref 2.0–3.0)

## 2019-03-19 NOTE — Patient Instructions (Addendum)
Description   Take 1 tablet today, then start taking 1/2 tablet daily except 1 tablet on Sundays, Wednesdays, and Fridays. Please adhere to appointments. Recheck INR in 1 week. Coumadin Clinic#[phone] Main [phone]

## 2019-03-19 NOTE — Telephone Encounter (Signed)
CSW called taxi for pt to get to coumadin appt today.  CSW will continue to follow and assist as needed  Jorge Ny, [place] [facility] Desk#: [phone] Cell#: [phone]

## 2019-03-22 MED FILL — PROAIR HFA 90 MCG INHALER: 108 (90 BAS | 16 days supply | Qty: 9 | Fill #0

## 2019-03-22 MED FILL — SYMBICORT 80-4.5 MCG INH: 80-4.5 | 30 days supply | Qty: 10 | Fill #1

## 2019-03-25 ENCOUNTER — Telehealth (INDEPENDENT_AMBULATORY_CARE_PROVIDER_SITE_OTHER): Payer: Self-pay

## 2019-03-25 NOTE — Telephone Encounter (Signed)
Message received from Tammy Sours, [place] requesting follow up appointment for patient. Informed her that an appointment has been scheduled for 04/01/2019 @ 1530 @ [facility].

## 2019-03-26 ENCOUNTER — Telehealth (HOSPITAL_COMMUNITY): Payer: Self-pay | Admitting: Licensed Clinical Social Worker

## 2019-03-26 NOTE — Telephone Encounter (Signed)
CSW confirmed with pt that he is able to make appt with [facility] on 8/10- informed him they will let him know ahead of time if this will be a virtual or in person appt   CSW will continue to follow and assist as needed  Jorge Ny, [place] [facility] Desk#: [phone] Cell#: [phone]

## 2019-03-26 NOTE — H&P (View-Only) (Signed)
PCP:  Grayce Sessions, NP          Cardiologist:  Verne Carrow, MD Primary HF: Dr [name]  HPI: Mark [name] is a 43 y.o. male with a history of  significant forchronic systolic CHF (EF 28-41% with severe diffuse hypokinesis 12/2018,NICM by cardiac cath 2017, history of right MCA CVA 2019, asthma, and polysubstance use.  In 2017 he was diagnosed with new onset acute systolic heart failure. Cath at that time showed normal coronaries. He was started on HF meds. Unfortunately he was incarcerated 14 months until late 2018.    Admitted 12/2018 with increased dyspnea. He had run out of medications.  ECHO completed 12/2018 showed severely reduced LV/RVEF 5-10% with large apical thrombus and grade II DD. At that time he refused to go on coumadin.   Admitted to [facility] 01/24/2019 with increased dyspnea. UDS was + for cocaine. Diuresed with IV lasix. He had run out of his Hf medications. He was restarted back on HF medications.  Admitted  02/13/19 through 02/18/19 with A/C systolic heart failure. He also had dental pain and was placed on antibiotics. Dr Kristin Bruins consulted. Planning for  multiple teeth extractions with general anesthesia.    Today he returns for HF follow up. Last visit he was started on spiro started and lasix was increased to 80 mg twice a day. Complaining of ongoing pain in his teeth. Overall feeling a little bit better. SOB  after walking a block. Denies PND/Orthopnea. 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 Appetite ok. No fever or chills. Weight at home 133 pounds. Taking all medications. Smoking 3-4 cigarettes per day. Followed by HF Paramedicine.   He has difficulty obtaining medications and with transportation.   Cardiac Studies  ECHO 12/2018 EF 10-15% severely reduced RV and new apical thrombus.  ECHO 2019 EF 10-15%  ROS: All systems negative except as listed in HPI, PMH and Problem List.  SH:  Social History   Socioeconomic History   . Marital status: Divorced    Spouse name: Not on file  . Number of children: Not on file  . Years of education: Not on file  . Highest education level: Not on file  Occupational History  . Not on file  Social Needs  . Financial resource strain: Not on file  . Food insecurity    Worry: Not on file    Inability: Not on file  . Transportation needs    Medical: Not on file    Non-medical: Not on file  Tobacco Use  . Smoking status: Former Smoker    Packs/day: 1.00    Years: 30.00    Pack years: 30.00    Types: Cigarettes    Quit date: 09/27/2017    Years since quitting: 1.4  . Smokeless tobacco: Never Used  Substance and Sexual Activity  . Alcohol use: Yes    Alcohol/week: 5.0 standard drinks    Types: 5 Shots of liquor per week    Frequency: Never    Comment: 5-6 shots of vodka daily; "stopped it all after I had stroke 09/27/2017"  . Drug use: Yes    Types: Cocaine, Marijuana    Comment: 11/09/2017 "none since 09/27/2017"  . Sexual activity: Not Currently  Lifestyle  . Physical activity    Days per week: Not on file    Minutes per session: Not on file  . Stress: Not on file  Relationships  . Social Musician on phone:  Not on file    Gets together: Not on file    Attends religious service: Not on file    Active member of club or organization: Not on file    Attends meetings of clubs or organizations: Not on file    Relationship status: Not on file  . Intimate partner violence    Fear of current or ex partner: Not on file    Emotionally abused: Not on file    Physically abused: Not on file    Forced sexual activity: Not on file  Other Topics Concern  . Not on file  Social History Narrative  . Not on file    FH:  Family History  Problem Relation Age of Onset  . Cardiomyopathy Father        Reports his father has an LVAD  . Heart failure Father   . Hypertension Father   . Heart disease Maternal Grandmother        had a whole in her heart  . Deep vein  thrombosis Neg Hx     Past Medical History:  Diagnosis Date  . Asthma   . Chronic systolic CHF (congestive heart failure) (HCC)   . Cigarette smoker   . CKD (chronic kidney disease), stage II    /notes 10/01/2017  . COPD (chronic obstructive pulmonary disease) (HCC) 10/21/2017   on CT scan chest  . History of echocardiogram    a. Echo 5/17 - EF 20-25%, severe diff HK, restrictive physiology, mild to mod MR, severe reduced RVSF, mod RVE, mild RAE, mod TR, PASP 48 mmHg  . Hx of cardiac cath    a. LHC 5/17 - normal coronary arteries. PA 45/25, mean 33, PCWP mean 18  . NICM (nonischemic cardiomyopathy) (HCC)   . Stroke (HCC) 09/27/2017   "was weak on my left side; I'm fully recovered" (11/09/2017)  . Substance abuse (HCC)    cocaine, marijuana    Current Outpatient Medications  Medication Sig Dispense Refill  . albuterol (VENTOLIN HFA) 108 (90 Base) MCG/ACT inhaler Inhale 1-2 puffs into the lungs every 4 (four) hours as needed for wheezing or shortness of breath. 1 Inhaler 4  . aspirin 81 MG EC tablet Take 1 tablet (81 mg total) by mouth daily. 30 tablet 4  . atorvastatin (LIPITOR) 40 MG tablet Take 40 mg by mouth daily.    . budesonide-formoterol (SYMBICORT) 80-4.5 MCG/ACT inhaler Inhale 2 puffs into the lungs 2 (two) times daily. 1 Inhaler 3  . digoxin (LANOXIN) 0.125 MG tablet Take 1 tablet (0.125 mg total) by mouth daily. 30 tablet 5  . diphenhydrAMINE (BENADRYL ALLERGY) 25 MG tablet Take 1 tablet (25 mg total) by mouth every 8 (eight) hours as needed for allergies (with oral levaquin and flagyl). 30 tablet 0  . ferrous sulfate 325 (65 FE) MG EC tablet Take 325 mg by mouth daily.    . furosemide (LASIX) 80 MG tablet Take 1 tablet (80 mg total) by mouth daily. 30 tablet 3  . levofloxacin (LEVAQUIN) 500 MG tablet Take 1 tablet (500 mg total) by mouth daily. 7 tablet 0  . Multiple Vitamin (MULTIVITAMIN WITH MINERALS) TABS tablet Take 1 tablet by mouth daily.    . nicotine (NICODERM CQ -  DOSED IN MG/24 HOURS) 14 mg/24hr patch Place 1 patch (14 mg total) onto the skin daily. 28 patch 0  . potassium chloride SA (K-DUR) 20 MEQ tablet Take 1 tablet (20 mEq total) by mouth daily. Take 40meq with Metolazone. 60 tablet   0  . spironolactone (ALDACTONE) 25 MG tablet Take 1 tablet (25 mg total) by mouth daily. 60 tablet 6  . warfarin (COUMADIN) 5 MG tablet Take 1 tablet daily or as directed by Coumadin Clinic. (Patient taking differently: Take 5 mg by mouth daily at 6 PM. ) 30 tablet 0   No current facility-administered medications for this encounter.     Vitals:   03/27/19 1054  BP: 100/60  Pulse: 96  SpO2: 99%  Weight: 63 kg (139 lb)   Wt Readings from Last 3 Encounters:  03/27/19 63 kg (139 lb)  03/11/19 63.9 kg (140 lb 12.8 oz)  03/08/19 69.9 kg (154 lb)    PHYSICAL EXAM: General:  No resp difficulty HEENT: normal Neck: supple. JVP 6-7. Carotids 2+ bilat; no bruits. No lymphadenopathy or thryomegaly appreciated. Cor: PMI nondisplaced. Regular rate & rhythm. No rubs, or murmurs. +S3  Lungs: clear Abdomen: soft, nontender, nondistended. No hepatosplenomegaly. No bruits or masses. Good bowel sounds. Extremities: no cyanosis, clubbing, rash, edema Neuro: alert & orientedx3, cranial nerves grossly intact. moves all 4 extremities w/o difficulty. Affect pleasant  ASSESSMENT & PLAN: 1. Chronic Biventricular Heart Failure ECHO 12/2018 EF 10-15% Severely reduced RV.  NYHA IIIb.-Volume status stable. Continue lasix to 80 mg twice a day.   - No bb with low output and recent cocaine abuse.  - Continue digoxin 0.125 mg daily.  - Continue spironolactone to 25 mg daily.   - Hold off on losartan. Keep off for now.  No entresto with h/o angioedema.   - Will need RHC once we set up time for dental procedure.   2. Apical Thrombus Identified on ECHO 12/2018. At that time he refused to take coumadin. -Continue on coumadin. - INR followed at the coumadin clinic.   3. H/O MCA CVA  2019  Continue  atorvastatin 40 mg daily. Continue aspirin 81 mg daily.   4. Cocaine Abuse UDS + in June   5. Social  He can't pay for medications and does not have transportation to appointments. HFSW following. Continue HF Paramedicine  6. Tobacco Abuse Discussed smoking cessation.   7. Periodontitis Followed by Dr Enrique Sack and needs multiple teeth extracted.  Plans per Dr Enrique Sack.I personally called and discussed with him. We will look at the week of August 17th for hospital admit.   Per Dr Haroldine Laws he will need hospital admit prior to dental procedure for RHC to optimize prior to surgery. I will arrange with Dr Haroldine Laws.   Check BMET.    Zelie Asbill NP-C  11:13 AM

## 2019-03-26 NOTE — Progress Notes (Signed)
PCP:  Grayce Sessions, NP          Cardiologist:  Verne Carrow, MD Primary HF: Dr [name]  HPI: Mark [name] is a 43 y.o. male with a history of  significant forchronic systolic CHF (EF 28-41% with severe diffuse hypokinesis 12/2018,NICM by cardiac cath 2017, history of right MCA CVA 2019, asthma, and polysubstance use.  In 2017 he was diagnosed with new onset acute systolic heart failure. Cath at that time showed normal coronaries. He was started on HF meds. Unfortunately he was incarcerated 14 months until late 2018.    Admitted 12/2018 with increased dyspnea. He had run out of medications.  ECHO completed 12/2018 showed severely reduced LV/RVEF 5-10% with large apical thrombus and grade II DD. At that time he refused to go on coumadin.   Admitted to [facility] 01/24/2019 with increased dyspnea. UDS was + for cocaine. Diuresed with IV lasix. He had run out of his Hf medications. He was restarted back on HF medications.  Admitted  02/13/19 through 02/18/19 with A/C systolic heart failure. He also had dental pain and was placed on antibiotics. Dr Kristin Bruins consulted. Planning for  multiple teeth extractions with general anesthesia.    Today he returns for HF follow up. Last visit he was started on spiro started and lasix was increased to 80 mg twice a day. Complaining of ongoing pain in his teeth. Overall feeling a little bit better. SOB  after walking a block. Denies PND/Orthopnea. 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 Appetite ok. No fever or chills. Weight at home 133 pounds. Taking all medications. Smoking 3-4 cigarettes per day. Followed by HF Paramedicine.   He has difficulty obtaining medications and with transportation.   Cardiac Studies  ECHO 12/2018 EF 10-15% severely reduced RV and new apical thrombus.  ECHO 2019 EF 10-15%  ROS: All systems negative except as listed in HPI, PMH and Problem List.  SH:  Social History   Socioeconomic History   . Marital status: Divorced    Spouse name: Not on file  . Number of children: Not on file  . Years of education: Not on file  . Highest education level: Not on file  Occupational History  . Not on file  Social Needs  . Financial resource strain: Not on file  . Food insecurity    Worry: Not on file    Inability: Not on file  . Transportation needs    Medical: Not on file    Non-medical: Not on file  Tobacco Use  . Smoking status: Former Smoker    Packs/day: 1.00    Years: 30.00    Pack years: 30.00    Types: Cigarettes    Quit date: 09/27/2017    Years since quitting: 1.4  . Smokeless tobacco: Never Used  Substance and Sexual Activity  . Alcohol use: Yes    Alcohol/week: 5.0 standard drinks    Types: 5 Shots of liquor per week    Frequency: Never    Comment: 5-6 shots of vodka daily; "stopped it all after I had stroke 09/27/2017"  . Drug use: Yes    Types: Cocaine, Marijuana    Comment: 11/09/2017 "none since 09/27/2017"  . Sexual activity: Not Currently  Lifestyle  . Physical activity    Days per week: Not on file    Minutes per session: Not on file  . Stress: Not on file  Relationships  . Social Musician on phone:  Not on file    Gets together: Not on file    Attends religious service: Not on file    Active member of club or organization: Not on file    Attends meetings of clubs or organizations: Not on file    Relationship status: Not on file  . Intimate partner violence    Fear of current or ex partner: Not on file    Emotionally abused: Not on file    Physically abused: Not on file    Forced sexual activity: Not on file  Other Topics Concern  . Not on file  Social History Narrative  . Not on file    FH:  Family History  Problem Relation Age of Onset  . Cardiomyopathy Father        Reports his father has an LVAD  . Heart failure Father   . Hypertension Father   . Heart disease Maternal Grandmother        had a whole in her heart  . Deep vein  thrombosis Neg Hx     Past Medical History:  Diagnosis Date  . Asthma   . Chronic systolic CHF (congestive heart failure) (HCC)   . Cigarette smoker   . CKD (chronic kidney disease), stage II    [name]/notes 10/01/2017  . COPD (chronic obstructive pulmonary disease) (HCC) 10/21/2017   on CT scan chest  . History of echocardiogram    a. Echo 5/17 - EF 20-25%, severe diff HK, restrictive physiology, mild to mod MR, severe reduced RVSF, mod RVE, mild RAE, mod TR, PASP 48 mmHg  . Hx of cardiac cath    a. LHC 5/17 - normal coronary arteries. PA 45/25, mean 33, PCWP mean 18  . NICM (nonischemic cardiomyopathy) (HCC)   . Stroke (HCC) 09/27/2017   "was weak on my left side; I'm fully recovered" (11/09/2017)  . Substance abuse (HCC)    cocaine, marijuana    Current Outpatient Medications  Medication Sig Dispense Refill  . albuterol (VENTOLIN HFA) 108 (90 Base) MCG/ACT inhaler Inhale 1-2 puffs into the lungs every 4 (four) hours as needed for wheezing or shortness of breath. 1 Inhaler 4  . aspirin 81 MG EC tablet Take 1 tablet (81 mg total) by mouth daily. 30 tablet 4  . atorvastatin (LIPITOR) 40 MG tablet Take 40 mg by mouth daily.    . budesonide-formoterol (SYMBICORT) 80-4.5 MCG/ACT inhaler Inhale 2 puffs into the lungs 2 (two) times daily. 1 Inhaler 3  . digoxin (LANOXIN) 0.125 MG tablet Take 1 tablet (0.125 mg total) by mouth daily. 30 tablet 5  . diphenhydrAMINE (BENADRYL ALLERGY) 25 MG tablet Take 1 tablet (25 mg total) by mouth every 8 (eight) hours as needed for allergies (with oral levaquin and flagyl). 30 tablet 0  . ferrous sulfate 325 (65 FE) MG EC tablet Take 325 mg by mouth daily.    . furosemide (LASIX) 80 MG tablet Take 1 tablet (80 mg total) by mouth daily. 30 tablet 3  . levofloxacin (LEVAQUIN) 500 MG tablet Take 1 tablet (500 mg total) by mouth daily. 7 tablet 0  . Multiple Vitamin (MULTIVITAMIN WITH MINERALS) TABS tablet Take 1 tablet by mouth daily.    . nicotine (NICODERM CQ -  DOSED IN MG/24 HOURS) 14 mg/24hr patch Place 1 patch (14 mg total) onto the skin daily. 28 patch 0  . potassium chloride SA (K-DUR) 20 MEQ tablet Take 1 tablet (20 mEq total) by mouth daily. Take 40meq with Metolazone. 60 tablet  0  . spironolactone (ALDACTONE) 25 MG tablet Take 1 tablet (25 mg total) by mouth daily. 60 tablet 6  . warfarin (COUMADIN) 5 MG tablet Take 1 tablet daily or as directed by Coumadin Clinic. (Patient taking differently: Take 5 mg by mouth daily at 6 PM. ) 30 tablet 0   No current facility-administered medications for this encounter.     Vitals:   03/27/19 1054  BP: 100/60  Pulse: 96  SpO2: 99%  Weight: 63 kg (139 lb)   Wt Readings from Last 3 Encounters:  03/27/19 63 kg (139 lb)  03/11/19 63.9 kg (140 lb 12.8 oz)  03/08/19 69.9 kg (154 lb)    PHYSICAL EXAM: General:  No resp difficulty HEENT: normal Neck: supple. JVP 6-7. Carotids 2+ bilat; no bruits. No lymphadenopathy or thryomegaly appreciated. Cor: PMI nondisplaced. Regular rate & rhythm. No rubs, or murmurs. +S3  Lungs: clear Abdomen: soft, nontender, nondistended. No hepatosplenomegaly. No bruits or masses. Good bowel sounds. Extremities: no cyanosis, clubbing, rash, edema Neuro: alert & orientedx3, cranial nerves grossly intact. moves all 4 extremities w/o difficulty. Affect pleasant  ASSESSMENT & PLAN: 1. Chronic Biventricular Heart Failure ECHO 12/2018 EF 10-15% Severely reduced RV.  NYHA IIIb.-Volume status stable. Continue lasix to 80 mg twice a day.   - No bb with low output and recent cocaine abuse.  - Continue digoxin 0.125 mg daily.  - Continue spironolactone to 25 mg daily.   - Hold off on losartan. Keep off for now.  No entresto with h/o angioedema.   - Will need RHC once we set up time for dental procedure.   2. Apical Thrombus Identified on ECHO 12/2018. At that time he refused to take coumadin. -Continue on coumadin. - INR followed at the coumadin clinic.   3. H/O MCA CVA  2019  Continue  atorvastatin 40 mg daily. Continue aspirin 81 mg daily.   4. Cocaine Abuse UDS + in June   5. Social  He can't pay for medications and does not have transportation to appointments. HFSW following. Continue HF Paramedicine  6. Tobacco Abuse Discussed smoking cessation.   7. Periodontitis Followed by Dr Enrique Sack and needs multiple teeth extracted.  Plans per Dr Enrique Sack.I personally called and discussed with him. We will look at the week of August 17th for hospital admit.   Per Dr Haroldine Laws he will need hospital admit prior to dental procedure for RHC to optimize prior to surgery. I will arrange with Dr Haroldine Laws.   Check BMET.    Glendine Swetz NP-C  11:13 AM

## 2019-03-27 ENCOUNTER — Other Ambulatory Visit: Payer: Self-pay

## 2019-03-27 ENCOUNTER — Ambulatory Visit (HOSPITAL_COMMUNITY)
Admission: RE | Admit: 2019-03-27 | Discharge: 2019-03-27 | Disposition: A | Discharge status: Home / Self Care | Payer: Medicaid Other | Source: Ambulatory Visit | Attending: Cardiology | Admitting: Cardiology

## 2019-03-27 ENCOUNTER — Telehealth (HOSPITAL_COMMUNITY): Payer: Self-pay

## 2019-03-27 ENCOUNTER — Ambulatory Visit (INDEPENDENT_AMBULATORY_CARE_PROVIDER_SITE_OTHER): Payer: Medicaid Other | Admitting: *Deleted

## 2019-03-27 ENCOUNTER — Other Ambulatory Visit (HOSPITAL_COMMUNITY): Payer: Self-pay

## 2019-03-27 ENCOUNTER — Telehealth (HOSPITAL_COMMUNITY): Payer: Self-pay | Admitting: Licensed Clinical Social Worker

## 2019-03-27 ENCOUNTER — Telehealth: Payer: Self-pay

## 2019-03-27 ENCOUNTER — Encounter (HOSPITAL_COMMUNITY): Payer: Self-pay

## 2019-03-27 VITALS — BP 100/60 | HR 96 | Wt 139.0 lb

## 2019-03-27 DIAGNOSIS — Z5181 Encounter for therapeutic drug level monitoring: Secondary | ICD-10-CM | POA: Diagnosis not present

## 2019-03-27 DIAGNOSIS — I5022 Chronic systolic (congestive) heart failure: Secondary | ICD-10-CM

## 2019-03-27 DIAGNOSIS — Z87891 Personal history of nicotine dependence: Secondary | ICD-10-CM | POA: Insufficient documentation

## 2019-03-27 DIAGNOSIS — I428 Other cardiomyopathies: Secondary | ICD-10-CM

## 2019-03-27 DIAGNOSIS — Z8249 Family history of ischemic heart disease and other diseases of the circulatory system: Secondary | ICD-10-CM | POA: Insufficient documentation

## 2019-03-27 DIAGNOSIS — Z7982 Long term (current) use of aspirin: Secondary | ICD-10-CM | POA: Insufficient documentation

## 2019-03-27 DIAGNOSIS — K0530 Chronic periodontitis, unspecified: Secondary | ICD-10-CM | POA: Insufficient documentation

## 2019-03-27 DIAGNOSIS — Z720 Tobacco use: Secondary | ICD-10-CM | POA: Diagnosis not present

## 2019-03-27 DIAGNOSIS — I639 Cerebral infarction, unspecified: Secondary | ICD-10-CM

## 2019-03-27 DIAGNOSIS — J449 Chronic obstructive pulmonary disease, unspecified: Secondary | ICD-10-CM | POA: Insufficient documentation

## 2019-03-27 DIAGNOSIS — Z7951 Long term (current) use of inhaled steroids: Secondary | ICD-10-CM | POA: Insufficient documentation

## 2019-03-27 DIAGNOSIS — Z7901 Long term (current) use of anticoagulants: Secondary | ICD-10-CM | POA: Insufficient documentation

## 2019-03-27 DIAGNOSIS — F1411 Cocaine abuse, in remission: Secondary | ICD-10-CM

## 2019-03-27 DIAGNOSIS — I5082 Biventricular heart failure: Secondary | ICD-10-CM | POA: Insufficient documentation

## 2019-03-27 DIAGNOSIS — F1410 Cocaine abuse, uncomplicated: Secondary | ICD-10-CM | POA: Insufficient documentation

## 2019-03-27 DIAGNOSIS — N182 Chronic kidney disease, stage 2 (mild): Secondary | ICD-10-CM | POA: Diagnosis not present

## 2019-03-27 DIAGNOSIS — I513 Intracardiac thrombosis, not elsewhere classified: Secondary | ICD-10-CM

## 2019-03-27 DIAGNOSIS — Z79899 Other long term (current) drug therapy: Secondary | ICD-10-CM | POA: Insufficient documentation

## 2019-03-27 DIAGNOSIS — Z8673 Personal history of transient ischemic attack (TIA), and cerebral infarction without residual deficits: Secondary | ICD-10-CM | POA: Insufficient documentation

## 2019-03-27 LAB — BASIC METABOLIC PANEL
Anion gap: 12 (ref 5–15)
BUN: 33 mg/dL — ABNORMAL HIGH (ref 6–20)
CO2: 26 mmol/L (ref 22–32)
Calcium: 9.4 mg/dL (ref 8.9–10.3)
Chloride: 95 mmol/L — ABNORMAL LOW (ref 98–111)
Creatinine, Ser: 0.81 mg/dL (ref 0.61–1.24)
GFR calc Af Amer: >60 mL/min (ref >60)
GFR calc non Af Amer: >60 mL/min (ref >60)
Glucose, Bld: 105 mg/dL — ABNORMAL HIGH (ref 70–99)
Potassium: 5.8 mmol/L — ABNORMAL HIGH (ref 3.5–5.1)
Sodium: 133 mmol/L — ABNORMAL LOW (ref 135–145)

## 2019-03-27 LAB — POCT INR: INR: 1.2 — AB (ref 2.0–3.0)

## 2019-03-27 NOTE — Telephone Encounter (Signed)
-----   Message from Conrad Wainiha, NP sent at 03/27/2019  3:16 PM EDT ----- Please call needs redraw ASAP

## 2019-03-27 NOTE — Progress Notes (Signed)
Paramedicine Encounter   Patient ID: Mark [name] , male,   DOB: 01-Feb-1976,43 y.o.,  MRN: [id number]   Met patient in clinic today with provider Amy.   Pt's swelling has decrease significantly. He said that he feel a lot better; smoking 3 cigarettes daily.  He denies cocaine (lately) and no alcohol. Nutritional education during this visit. Mark [name] said that he's taking his medications as prescribed.  He denies sob, chest pain and dizziness.    *no medication changes during this visit  Time spent with patient   Mark [name], [name] 03/27/2019   ACTION: Home visit completed

## 2019-03-27 NOTE — Telephone Encounter (Signed)
Called pt. Pt agreeable for lab redraw tomorrow.

## 2019-03-27 NOTE — Patient Instructions (Addendum)
Lab work done today. We will notify you of any abnormal lab work. No news is good news!  Please follow up with the [facility] in 4 weeks.  At the [facility], you and your health needs are our priority. As part of our continuing mission to provide you with exceptional heart care, we have created designated Provider Care Teams. These Care Teams include your primary Cardiologist (physician) and Advanced Practice Providers (APPs- Physician Assistants and Nurse Practitioners) who all work together to provide you with the care you need, when you need it.   You may see any of the following providers on your designated Care Team at your next follow up: . Dr Daniel Bensimhon . Dr Dalton McLean . Amy Clegg, NP   Please be sure to bring in all your medications bottles to every appointment.    

## 2019-03-27 NOTE — Telephone Encounter (Signed)
CSW assisted pt with taxi to get to coumadin appt today.  CSW will continue to follow and assist as needed  Jorge Ny, [facility] Desk#: [phone] Cell#: [phone]

## 2019-03-27 NOTE — Patient Instructions (Addendum)
Description   Take 1.5 tablets today and tomorrow then start taking 1 tablet daily except  1/2 tablet on Tuesdays. Please adhere to appointments. Recheck INR in 1 week. Coumadin Clinic#[phone] Main [phone]

## 2019-03-27 NOTE — Telephone Encounter (Signed)
As per Mark [name], [employer], the patient's case is now closed.  

## 2019-03-28 ENCOUNTER — Ambulatory Visit (HOSPITAL_COMMUNITY)
Admission: RE | Admit: 2019-03-28 | Discharge: 2019-03-28 | Disposition: A | Discharge status: Home / Self Care | Payer: Medicaid Other | Source: Ambulatory Visit | Attending: Internal Medicine | Admitting: Internal Medicine

## 2019-03-28 DIAGNOSIS — I5022 Chronic systolic (congestive) heart failure: Secondary | ICD-10-CM

## 2019-03-28 LAB — BASIC METABOLIC PANEL
Anion gap: 10 (ref 5–15)
BUN: 24 mg/dL — ABNORMAL HIGH (ref 6–20)
CO2: 26 mmol/L (ref 22–32)
Calcium: 9.5 mg/dL (ref 8.9–10.3)
Chloride: 97 mmol/L — ABNORMAL LOW (ref 98–111)
Creatinine, Ser: 0.81 mg/dL (ref 0.61–1.24)
GFR calc Af Amer: >60 mL/min (ref >60)
GFR calc non Af Amer: >60 mL/min (ref >60)
Glucose, Bld: 127 mg/dL — ABNORMAL HIGH (ref 70–99)
Potassium: 4.6 mmol/L (ref 3.5–5.1)
Sodium: 133 mmol/L — ABNORMAL LOW (ref 135–145)

## 2019-03-29 ENCOUNTER — Other Ambulatory Visit: Payer: Self-pay | Admitting: Internal Medicine

## 2019-03-29 ENCOUNTER — Other Ambulatory Visit (HOSPITAL_COMMUNITY): Payer: Self-pay | Admitting: Internal Medicine

## 2019-03-29 MED FILL — FUROSEMIDE 80 MG TAB: 80 | 30 days supply | Qty: 30 | Fill #1

## 2019-04-01 ENCOUNTER — Encounter (INDEPENDENT_AMBULATORY_CARE_PROVIDER_SITE_OTHER): Payer: Self-pay | Admitting: Primary Care

## 2019-04-01 ENCOUNTER — Other Ambulatory Visit: Payer: Self-pay

## 2019-04-01 ENCOUNTER — Ambulatory Visit (INDEPENDENT_AMBULATORY_CARE_PROVIDER_SITE_OTHER): Payer: Medicaid Other | Admitting: Primary Care

## 2019-04-01 ENCOUNTER — Telehealth (HOSPITAL_COMMUNITY): Payer: Self-pay | Admitting: Licensed Clinical Social Worker

## 2019-04-01 VITALS — BP 95/67 | HR 47 | Temp 97.5°F | Ht 69.0 in | Wt 141.6 lb

## 2019-04-01 DIAGNOSIS — Z23 Encounter for immunization: Secondary | ICD-10-CM | POA: Diagnosis not present

## 2019-04-01 DIAGNOSIS — I5041 Acute combined systolic (congestive) and diastolic (congestive) heart failure: Secondary | ICD-10-CM | POA: Diagnosis not present

## 2019-04-01 DIAGNOSIS — K0889 Other specified disorders of teeth and supporting structures: Secondary | ICD-10-CM | POA: Diagnosis not present

## 2019-04-01 NOTE — Patient Instructions (Signed)
Community Resources  Advocacy/Legal Legal Aid [place]:  1-866-219-5262  /  336-272-0148  Family Justice Center:  336-641-7233  [facility] [place] 24-hr Crisis line:  336-273-7273  Women's Resource Center, [place]:  336-275-6090  Court Watch (custody):  336-275-2346  [place] [facility] Law [facility]:   336-279-9299    Baby & Breastfeeding Car Seat Inspection @ Various [place] Fire Depts.- call 336-373-2177  [place] [facility]act[facility]  336-832-6860  [facility] 336-878-6712  WIC: 336-641-3663 ([place]);  336-641-7571 ([place])  La Leche League:  1-877-452-5321   Childcare [place] [facility] Development: 336-369-5097 ([place]) / 336-887-8224 ([place])  - Child Care Resources/ Referrals/ Scholarships  - Head Start/ Early Head Start (call or apply online)  [place] DHHS: [place] Pre-K :  1-800-859-0829 / 336-274-5437   Employment / Job Search [facility] [place]: 336-275-6090 / [address]  [place] [employer] (JobLink): 336-373-5922 ([place]) / 336-882-4141 ([place])  [facility] [employer] Community Resource/ Career Center: 336-275-9801 / 336-282-7307  [place] [facility] Job & Career Center: 336-373-3764  DHHS Work First: 336-641-3447 ([place]) / 336-641-3447 ([place])  [facility] [place]:  336-676-5871   Financial Assistance [place] [facility]:  336-553-2657  Salvation Army: 336-235-0368  [place] Network (furniture):  336-370-4002  [place] Helping Hands: 336-373-4264  Low Income Energy Assistance  336-641-3000   Food Assistance DHHS- SNAP/ Food Stamps: 336-641-4588  WIC: [place]- 336-641-3663 ;  [place] 336-641-7571  Little Green Book- Free Meals  Little Blue Book- Free Food Pantries  During the summer, text "FOOD" to 877877   General Health / Clinics (Adults) Orange Card (for Adults) through [place] [facility]: (336) 895-4900  [facility]:   336-832-8035  [place] [facility]:   336-832-4444  Health Department:  336-641-3245  [place]:  336-415-3877 / 336-641-2100  Planned Parenthood of [place]:   336-373-0678  [facility]:   336-334-4822 x 50251   Housing [place] Housing Coalition:   336-691-9521  [place] Housing Authority:  336-275-8501  Affordable Housing Managemnt:  336-273-0568   Immigrant/ Refugee Center for [place]ians (UNCG):  336-256-1065  Faith [employer] [facility]:  336-379-0037  [employer]:  336-937-4701  [employer] World Services:  336-617-0381  African Services Coalition:  336-574-2677   LGBTQ YouthSAFE  www.youthsafe[place].org  PFLAG  336-541-6754 / info@[place].org  The Trevor Project:  1-866-488-7386   Mental Health/ Substance Use [facility] [place]  336-387-6161  [facility]:  336-832-9700 or 1-800-711-2635  [place]'s Circle of Care:  336-271-5888  [facility]:  336-294-1349  [place] Care Services:  336-542-2884  [facility] (walk-ins)  336-676-6840 / [address]  [place]:  800-449-1287  Alcoholics Anonymous:  336-854-4278  Narcotics Anonymous:  800-365-1036  Quit Smoking Hotline:  800-QUIT-NOW (800-784-8669)   Parenting Children's Home Society:  800-632-1400  [place]: Education Center & Support Groups:  336-832-6682  YWCA: 336-273-3461  [employer]CG: Bringing Out the Best:  336-334-3120               Thriving at Three (Hispanic families): 336-256-1066  Healthy Start ([facility] [place]):  336-387-6161 x2288  Parents as Teachers:  336-691-0024  [place] Child Development- Learning Together (Immigrants): 336-369-5001   Poison Control 800-222-1222  Sports & Recreation [employer] Open Doors Application: ymcanwnc.org/join/open-doors-financial-assistance/  City of [place] Recreation Centers: http://www.[place].gov/index.aspx?page=3615   Special Needs Family Support Network:  336-832-6507  Autism Society of [place]:   336-333-0197 x1402 or x1412 /  800-785-1035  TEACCH [place]:  336-334-5773     ARC of [place]:  336-373-1076  Children's Developmental Service Agency (CDSA):  336-334-5601  CC4C (Care Coordination for Children):  336-641-7641   Transportation Medicaid Transportation: 336-641-4848 to apply  [place] Transit Authority: 336-335-6499 (reduced-fare bus ID to Medicaid/ Medicare/ Orange Card)  SCAT Paratransit services: Eligible riders only, call 336-333-6589 for application   Tutoring/Mentoring Black Child Development Institute: 336-230-2138  Big Brothers/ Big Sisters: 336-378-9100 ([place])  336-882-4167 ([place])  ACES through child's school: 336-370-2321  [employer] Achievers: contact your local Y  SHIELD Mentor Program: 336-337-2771   

## 2019-04-01 NOTE — Progress Notes (Signed)
Established Patient Office Visit  Subjective:  Patient ID: Mark [name], male    DOB: 03/12/1976  Age: 43 y.o. MRN: [id number]  CC:  Chief Complaint  Patient presents with  . Follow-up    CHF    HPI [name] presents for follow up on mental stability 02/13/2019 for acute on chronic systolic congestive heart failure. History of poly substance use and a EF of 10 % he was having a difficult time accepting diagnosis. Today he states he is and has been clean including tobacco.  Past Medical History:  Diagnosis Date  . Asthma   . Chronic systolic CHF (congestive heart failure) ([place])   . Cigarette smoker   . CKD (chronic kidney disease), stage II    Archie Endo 10/01/2017  . COPD (chronic obstructive pulmonary disease) ([place]) 10/21/2017   on CT scan chest  . History of echocardiogram    a. Echo 5/17 - EF 20-25%, severe diff HK, restrictive physiology, mild to mod MR, severe reduced RVSF, mod RVE, mild RAE, mod TR, PASP 48 mmHg  . Hx of cardiac cath    a. LHC 5/17 - normal coronary arteries. PA 45/25, mean 33, PCWP mean 18  . NICM (nonischemic cardiomyopathy) ([place])   . Stroke ([place]) 09/27/2017   "was weak on my left side; I'm fully recovered" (11/09/2017)  . Substance abuse ([place])    cocaine, marijuana    Past Surgical History:  Procedure Laterality Date  . CARDIAC CATHETERIZATION N/A 01/05/2016   Procedure: Right/Left Heart Cath and Coronary Angiography;  Surgeon: Troy Sine, MD;  Location: [facility];  Service: Cardiovascular;  Laterality: N/A;    Family History  Problem Relation Age of Onset  . Cardiomyopathy Father        Reports his father has an LVAD  . Heart failure Father   . Hypertension Father   . Heart disease Maternal Grandmother        had a whole in her heart  . Deep vein thrombosis Neg Hx     Social History   Socioeconomic History  . Marital status: Divorced    Spouse name: Not on file  . Number of children: Not on file  . Years of  education: Not on file  . Highest education level: Not on file  Occupational History  . Not on file  Social Needs  . Financial resource strain: Not on file  . Food insecurity    Worry: Not on file    Inability: Not on file  . Transportation needs    Medical: Not on file    Non-medical: Not on file  Tobacco Use  . Smoking status: Former Smoker    Packs/day: 1.00    Years: 30.00    Pack years: 30.00    Types: Cigarettes    Quit date: 09/27/2017    Years since quitting: 1.5  . Smokeless tobacco: Never Used  Substance and Sexual Activity  . Alcohol use: Yes    Alcohol/week: 5.0 standard drinks    Types: 5 Shots of liquor per week    Frequency: Never    Comment: 5-6 shots of vodka daily; "stopped it all after I had stroke 09/27/2017"  . Drug use: Yes    Types: Cocaine, Marijuana    Comment: 11/09/2017 "none since 09/27/2017"  . Sexual activity: Not Currently  Lifestyle  . Physical activity    Days per week: Not on file    Minutes per session: Not on file  .  Stress: Not on file  Relationships  . Social Musicianconnections    Talks on phone: Not on file    Gets together: Not on file    Attends religious service: Not on file    Active member of club or organization: Not on file    Attends meetings of clubs or organizations: Not on file    Relationship status: Not on file  . Intimate partner violence    Fear of current or ex partner: Not on file    Emotionally abused: Not on file    Physically abused: Not on file    Forced sexual activity: Not on file  Other Topics Concern  . Not on file  Social History Narrative  . Not on file    Outpatient Medications Prior to Visit  Medication Sig Dispense Refill  . albuterol (VENTOLIN HFA) 108 (90 Base) MCG/ACT inhaler Inhale 1-2 puffs into the lungs every 4 (four) hours as needed for wheezing or shortness of breath. 1 Inhaler 4  . aspirin 81 MG EC tablet Take 1 tablet (81 mg total) by mouth daily. 30 tablet 4  . atorvastatin (LIPITOR) 40 MG  tablet Take 40 mg by mouth daily.    . budesonide-formoterol (SYMBICORT) 80-4.5 MCG/ACT inhaler Inhale 2 puffs into the lungs 2 (two) times daily. 1 Inhaler 3  . digoxin (LANOXIN) 0.125 MG tablet Take 1 tablet (0.125 mg total) by mouth daily. 30 tablet 5  . diphenhydrAMINE (BENADRYL ALLERGY) 25 MG tablet Take 1 tablet (25 mg total) by mouth every 8 (eight) hours as needed for allergies (with oral levaquin and flagyl). 30 tablet 0  . ferrous sulfate 325 (65 FE) MG EC tablet Take 325 mg by mouth daily.    . furosemide (LASIX) 80 MG tablet Take 1 tablet (80 mg total) by mouth daily. 30 tablet 3  . Multiple Vitamin (MULTIVITAMIN WITH MINERALS) TABS tablet Take 1 tablet by mouth daily.    . nicotine (NICODERM CQ - DOSED IN MG/24 HOURS) 14 mg/24hr patch Place 1 patch (14 mg total) onto the skin daily. 28 patch 0  . potassium chloride SA (K-DUR) 20 MEQ tablet Take 1 tablet (20 mEq total) by mouth daily. Take 40meq with Metolazone. 60 tablet 0  . spironolactone (ALDACTONE) 25 MG tablet Take 1 tablet (25 mg total) by mouth daily. 60 tablet 6  . warfarin (COUMADIN) 5 MG tablet TAKE 1 TABLET DAILY OR AS DIRECTED BY COUMADIN CLINIC. 30 tablet 1   No facility-administered medications prior to visit.     Allergies  Allergen Reactions  . Hydrocodone Hives  . Lisinopril Swelling and Other (See Comments)    Facial swelling/angioedema  . Prednisone Shortness Of Breath, Nausea Only, Swelling and Other (See Comments)    Also made chest feel tight and genital area, legs, and face became swollen badly  . Bactrim [Sulfamethoxazole-Trimethoprim]   . Clindamycin/Lincomycin     "it swells me up and makes my heart beat fast"  . Penicillins Hives and Swelling     Has patient had a PCN reaction causing immediate rash, facial/tongue/throat swelling, SOB or lightheadedness with hypotension: Yes Has patient had a PCN reaction causing severe rash involving mucus membranes or skin necrosis: No Has patient had a PCN  reaction that required hospitalization: No Has patient had a PCN reaction occurring within the last 10 years: No If all of the above answers are "NO", then may proceed with Cephalosporin use.     ROS Review of Systems  HENT: Positive for  dental problem.   Neurological: Positive for dizziness and light-headedness.      Objective:    Physical Exam  Constitutional: He is oriented to person, place, and time.  Thin built   HENT:  Head: Normocephalic.  Neck: Normal range of motion.  Cardiovascular: Normal rate and regular rhythm.  Pulmonary/Chest: Effort normal and breath sounds normal.  Abdominal: Soft. Bowel sounds are normal.  Musculoskeletal: Normal range of motion.  Neurological: He is oriented to person, place, and time.  Skin: Skin is warm.  Psychiatric: He has a normal mood and affect.    BP 95/67 (BP Location: Left Arm, Patient Position: Sitting, Cuff Size: Normal)   Pulse (!) 47   Temp (!) 97.5 F (36.4 C) (Tympanic)   Ht 5\' 9"  (1.753 m)   Wt 141 lb 9.6 oz (64.2 kg)   SpO2 100%   BMI 20.91 kg/m  Wt Readings from Last 3 Encounters:  04/01/19 141 lb 9.6 oz (64.2 kg)  03/27/19 139 lb (63 kg)  03/11/19 140 lb 12.8 oz (63.9 kg)     Health Maintenance Due  Topic Date Due  . TETANUS/TDAP  03/22/1993    There are no preventive care reminders to display for this patient.  Lab Results  Component Value Date   TSH 0.289 (L) 11/09/2017   Lab Results  Component Value Date   WBC 5.0 02/18/2019   HGB 11.2 (L) 02/18/2019   HCT 37.2 (L) 02/18/2019   MCV 83.0 02/18/2019   PLT 153 02/18/2019   Lab Results  Component Value Date   NA 133 (L) 03/28/2019   K 4.6 03/28/2019   CO2 26 03/28/2019   GLUCOSE 127 (H) 03/28/2019   BUN 24 (H) 03/28/2019   CREATININE 0.81 03/28/2019   BILITOT 1.3 (H) 02/17/2019   ALKPHOS 104 02/17/2019   AST 49 (H) 02/17/2019   ALT 25 02/17/2019   PROT 6.5 02/17/2019   ALBUMIN 2.7 (L) 02/17/2019   CALCIUM 9.5 03/28/2019   ANIONGAP 10  03/28/2019   Lab Results  Component Value Date   CHOL 113 09/28/2017   Lab Results  Component Value Date   HDL 30 (L) 09/28/2017   Lab Results  Component Value Date   LDLCALC 68 09/28/2017   Lab Results  Component Value Date   TRIG 76 09/28/2017   Lab Results  Component Value Date   CHOLHDL 3.8 09/28/2017   Lab Results  Component Value Date   HGBA1C 6.8 (H) 12/27/2018      Assessment & Plan:   Problem List Items Addressed This Visit    None    Visit Diagnoses    Need for Tdap vaccination    -  Primary   Relevant Orders   Tdap vaccine greater than or equal to 7yo IM    Acel was seen today for follow-up.  Diagnoses and all orders for this visit:  Need for Tdap vaccination -     Tdap vaccine greater than or equal to 7yo IM  Pain, dental-  provided information of dentist who accepts his insurance. He will also need cardiac clearance for any dental work.  Acute combined systolic and diastolic congestive heart failure (HCC)  Congested heart failure - recommended to call your cardiologist when   following symptoms: 1) 3 pound weight gain in 24 hours or 5 pounds in 1 week 2) shortness of breath, with or without a dry hacking cough 3) swelling in the hands, feet or stomach 4) if you have to sleep on  extra pillows at night in order to breathe.  Followed by Dr. Arvilla Meresaniel Bensimhon   No orders of the defined types were placed in this encounter.   Follow-up: No follow-ups on file.    Grayce SessionsMichelle P Bern Fare, NP

## 2019-04-01 NOTE — Telephone Encounter (Signed)
CSW confirmed with pt that he has no transportation to get to PCP appt today- CSW provided pt with taxi to get to appt  CSW will continue to follow and assist as needed.  Jorge Ny, LCSW Clinical Social Worker [facility] Desk#: [phone] Cell#: [phone]

## 2019-04-02 ENCOUNTER — Other Ambulatory Visit (HOSPITAL_COMMUNITY): Payer: Self-pay

## 2019-04-02 ENCOUNTER — Other Ambulatory Visit (HOSPITAL_COMMUNITY): Payer: Self-pay | Admitting: Internal Medicine

## 2019-04-02 MED FILL — WARFARIN SODIUM 5 MG TABLET: 5 | 30 days supply | Qty: 30 | Fill #0

## 2019-04-02 NOTE — Progress Notes (Signed)
I went to [facility] to pick up Mark [name] prescription refills.  He still need warfarin and his albuterol inhaler.  He said that he's lost the inhaler and he really need it.    After several attempts, the pharmacy was able to make contact with his pcp for another inhaler.  [name] made aware of the same, his wife will pick both prescriptions up from [facility] tomorrow.  I dropped lasix off to [name] house.  We scheduled [facility] visit for Friday.

## 2019-04-03 ENCOUNTER — Telehealth (HOSPITAL_COMMUNITY): Payer: Self-pay | Admitting: Licensed Clinical Social Worker

## 2019-04-03 ENCOUNTER — Other Ambulatory Visit: Payer: Self-pay

## 2019-04-03 ENCOUNTER — Ambulatory Visit (INDEPENDENT_AMBULATORY_CARE_PROVIDER_SITE_OTHER): Payer: Medicaid Other

## 2019-04-03 DIAGNOSIS — Z5181 Encounter for therapeutic drug level monitoring: Secondary | ICD-10-CM | POA: Diagnosis not present

## 2019-04-03 DIAGNOSIS — I513 Intracardiac thrombosis, not elsewhere classified: Secondary | ICD-10-CM | POA: Diagnosis not present

## 2019-04-03 DIAGNOSIS — I639 Cerebral infarction, unspecified: Secondary | ICD-10-CM

## 2019-04-03 LAB — POCT INR: INR: 1.5 — AB (ref 2.0–3.0)

## 2019-04-03 MED FILL — PROAIR HFA 90 MCG INHALER: 108 (90 BAS | 16 days supply | Qty: 9 | Fill #0

## 2019-04-03 NOTE — Telephone Encounter (Signed)
CSW informed by pt that he needs transport to coumadin appt today- CSW set up taxi to take to appt  CSW will continue to follow and assist as needed  Mark [name], [facility] Desk#: [phone] Cell#: [phone]

## 2019-04-03 NOTE — Patient Instructions (Signed)
Description   Take 1.5 tablets today and tomorrow then continue on same dosage 1 tablet daily except  1/2 tablet on Tuesdays. Please adhere to appointments. Recheck INR in 1 week after hospital d/c. [facility]#[phone] Main [phone]

## 2019-04-05 ENCOUNTER — Telehealth (HOSPITAL_COMMUNITY): Payer: Self-pay | Admitting: *Deleted

## 2019-04-05 ENCOUNTER — Other Ambulatory Visit (HOSPITAL_COMMUNITY): Payer: Self-pay | Admitting: *Deleted

## 2019-04-05 DIAGNOSIS — I5022 Chronic systolic (congestive) heart failure: Secondary | ICD-10-CM

## 2019-04-05 MED ORDER — SODIUM CHLORIDE 0.9% FLUSH: 3.0000 mL | Freq: Two times a day (BID) | INTRAVENOUS | Status: DC

## 2019-04-05 NOTE — Telephone Encounter (Signed)
Per Darrick Grinder, NP pt needs a RHC w/Dr Bensimhon next week and will need to be admitted to hospital afterwards for dental surgery with Dr Enrique Sack.  Pt is aware and agreeable however transportation is a major issue for pt as his only ride does not get off work until 5 pm daily.  Advised we will arrange a Taxi to bring him to appts.  Mon 8/17 11:10 AM Covid testing Tue 8/18 RHC at 12 pm, pt aware to arrive at 10 am, NPO status and to hold Furosemide and Spironolactone day of test.  Pt is aware Taxi will pick him up and bring him for tests both days, he is agreeable and verbalizes understanding of all instructions.  Orders placed

## 2019-04-08 ENCOUNTER — Other Ambulatory Visit (HOSPITAL_COMMUNITY)
Admission: RE | Admit: 2019-04-08 | Discharge: 2019-04-08 | Disposition: A | Discharge status: Home / Self Care | Payer: Medicaid Other | Source: Ambulatory Visit | Attending: Internal Medicine | Admitting: Internal Medicine

## 2019-04-08 DIAGNOSIS — Z01812 Encounter for preprocedural laboratory examination: Secondary | ICD-10-CM | POA: Diagnosis not present

## 2019-04-08 DIAGNOSIS — Z20828 Contact with and (suspected) exposure to other viral communicable diseases: Secondary | ICD-10-CM | POA: Diagnosis not present

## 2019-04-08 LAB — SARS CORONAVIRUS 2 (TAT 6-24 HRS): SARS Coronavirus 2: NEGATIVE

## 2019-04-09 ENCOUNTER — Other Ambulatory Visit: Payer: Self-pay

## 2019-04-09 ENCOUNTER — Encounter (HOSPITAL_COMMUNITY): Payer: Self-pay | Admitting: *Deleted

## 2019-04-09 ENCOUNTER — Inpatient Hospital Stay (HOSPITAL_COMMUNITY)
Admission: RE | Admit: 2019-04-09 | Discharge: 2019-04-13 | DRG: 981 | Disposition: A | Discharge status: Home / Self Care | Payer: Medicaid Other | Attending: Internal Medicine | Admitting: Internal Medicine

## 2019-04-09 ENCOUNTER — Inpatient Hospital Stay (HOSPITAL_COMMUNITY)
Admission: RE | Disposition: A | Discharge status: Home / Self Care | Payer: Self-pay | Source: Home / Self Care | Attending: Internal Medicine

## 2019-04-09 DIAGNOSIS — N182 Chronic kidney disease, stage 2 (mild): Secondary | ICD-10-CM | POA: Diagnosis present

## 2019-04-09 DIAGNOSIS — I428 Other cardiomyopathies: Secondary | ICD-10-CM | POA: Diagnosis present

## 2019-04-09 DIAGNOSIS — Z7982 Long term (current) use of aspirin: Secondary | ICD-10-CM

## 2019-04-09 DIAGNOSIS — E440 Moderate protein-calorie malnutrition: Secondary | ICD-10-CM | POA: Insufficient documentation

## 2019-04-09 DIAGNOSIS — Z716 Tobacco abuse counseling: Secondary | ICD-10-CM

## 2019-04-09 DIAGNOSIS — F17210 Nicotine dependence, cigarettes, uncomplicated: Secondary | ICD-10-CM | POA: Diagnosis present

## 2019-04-09 DIAGNOSIS — I130 Hypertensive heart and chronic kidney disease with heart failure and stage 1 through stage 4 chronic kidney disease, or unspecified chronic kidney disease: Principal | ICD-10-CM | POA: Diagnosis present

## 2019-04-09 DIAGNOSIS — Z23 Encounter for immunization: Secondary | ICD-10-CM

## 2019-04-09 DIAGNOSIS — I5022 Chronic systolic (congestive) heart failure: Secondary | ICD-10-CM

## 2019-04-09 DIAGNOSIS — K045 Chronic apical periodontitis: Secondary | ICD-10-CM | POA: Diagnosis present

## 2019-04-09 DIAGNOSIS — Z8249 Family history of ischemic heart disease and other diseases of the circulatory system: Secondary | ICD-10-CM

## 2019-04-09 DIAGNOSIS — Z79899 Other long term (current) drug therapy: Secondary | ICD-10-CM

## 2019-04-09 DIAGNOSIS — R570 Cardiogenic shock: Secondary | ICD-10-CM | POA: Diagnosis present

## 2019-04-09 DIAGNOSIS — Z01818 Encounter for other preprocedural examination: Secondary | ICD-10-CM

## 2019-04-09 DIAGNOSIS — Z7901 Long term (current) use of anticoagulants: Secondary | ICD-10-CM | POA: Diagnosis not present

## 2019-04-09 DIAGNOSIS — F1410 Cocaine abuse, uncomplicated: Secondary | ICD-10-CM | POA: Diagnosis present

## 2019-04-09 DIAGNOSIS — Z7951 Long term (current) use of inhaled steroids: Secondary | ICD-10-CM

## 2019-04-09 DIAGNOSIS — I513 Intracardiac thrombosis, not elsewhere classified: Secondary | ICD-10-CM | POA: Diagnosis present

## 2019-04-09 DIAGNOSIS — J449 Chronic obstructive pulmonary disease, unspecified: Secondary | ICD-10-CM | POA: Diagnosis present

## 2019-04-09 DIAGNOSIS — I5082 Biventricular heart failure: Secondary | ICD-10-CM

## 2019-04-09 DIAGNOSIS — K0401 Reversible pulpitis: Secondary | ICD-10-CM | POA: Diagnosis present

## 2019-04-09 DIAGNOSIS — R0600 Dyspnea, unspecified: Secondary | ICD-10-CM

## 2019-04-09 DIAGNOSIS — K083 Retained dental root: Secondary | ICD-10-CM | POA: Diagnosis present

## 2019-04-09 DIAGNOSIS — Z20828 Contact with and (suspected) exposure to other viral communicable diseases: Secondary | ICD-10-CM | POA: Diagnosis present

## 2019-04-09 DIAGNOSIS — I5023 Acute on chronic systolic (congestive) heart failure: Secondary | ICD-10-CM | POA: Diagnosis present

## 2019-04-09 DIAGNOSIS — K029 Dental caries, unspecified: Secondary | ICD-10-CM | POA: Diagnosis present

## 2019-04-09 DIAGNOSIS — J4520 Mild intermittent asthma, uncomplicated: Secondary | ICD-10-CM

## 2019-04-09 DIAGNOSIS — Z8673 Personal history of transient ischemic attack (TIA), and cerebral infarction without residual deficits: Secondary | ICD-10-CM

## 2019-04-09 DIAGNOSIS — J189 Pneumonia, unspecified organism: Secondary | ICD-10-CM

## 2019-04-09 HISTORY — PX: RIGHT HEART CATH: CATH118263

## 2019-04-09 LAB — COOXEMETRY PANEL
Carboxyhemoglobin: 1.9 % — ABNORMAL HIGH (ref 0.5–1.5)
Methemoglobin: 1.2 % (ref 0.0–1.5)
O2 Saturation: 50.1 %
Total hemoglobin: 11.9 g/dL — ABNORMAL LOW (ref 12.0–16.0)

## 2019-04-09 LAB — POCT I-STAT EG7
Acid-Base Excess: 1 mmol/L (ref 0.0–2.0)
Acid-Base Excess: 2 mmol/L (ref 0.0–2.0)
Acid-Base Excess: 2 mmol/L (ref 0.0–2.0)
Bicarbonate: 27.3 mmol/L (ref 20.0–28.0)
Bicarbonate: 28.5 mmol/L — ABNORMAL HIGH (ref 20.0–28.0)
Bicarbonate: 28.6 mmol/L — ABNORMAL HIGH (ref 20.0–28.0)
Calcium, Ion: 1.09 mmol/L — ABNORMAL LOW (ref 1.15–1.40)
Calcium, Ion: 1.18 mmol/L (ref 1.15–1.40)
Calcium, Ion: 1.22 mmol/L (ref 1.15–1.40)
HCT: 36 % — ABNORMAL LOW (ref 39.0–52.0)
HCT: 38 % — ABNORMAL LOW (ref 39.0–52.0)
HCT: 38 % — ABNORMAL LOW (ref 39.0–52.0)
Hemoglobin: 12.2 g/dL — ABNORMAL LOW (ref 13.0–17.0)
Hemoglobin: 12.9 g/dL — ABNORMAL LOW (ref 13.0–17.0)
Hemoglobin: 12.9 g/dL — ABNORMAL LOW (ref 13.0–17.0)
O2 Saturation: 44 %
O2 Saturation: 45 %
O2 Saturation: 52 %
Potassium: 3.4 mmol/L — ABNORMAL LOW (ref 3.5–5.1)
Potassium: 3.6 mmol/L (ref 3.5–5.1)
Potassium: 3.8 mmol/L (ref 3.5–5.1)
Sodium: 138 mmol/L (ref 135–145)
Sodium: 138 mmol/L (ref 135–145)
Sodium: 140 mmol/L (ref 135–145)
TCO2: 29 mmol/L (ref 22–32)
TCO2: 30 mmol/L (ref 22–32)
TCO2: 30 mmol/L (ref 22–32)
pCO2, Ven: 49.1 mmHg (ref 44.0–60.0)
pCO2, Ven: 50.5 mmHg (ref 44.0–60.0)
pCO2, Ven: 50.6 mmHg (ref 44.0–60.0)
pH, Ven: 7.353 (ref 7.250–7.430)
pH, Ven: 7.36 (ref 7.250–7.430)
pH, Ven: 7.361 (ref 7.250–7.430)
pO2, Ven: 26 mmHg — CL (ref 32.0–45.0)
pO2, Ven: 26 mmHg — CL (ref 32.0–45.0)
pO2, Ven: 29 mmHg — CL (ref 32.0–45.0)

## 2019-04-09 LAB — BASIC METABOLIC PANEL
Anion gap: 11 (ref 5–15)
BUN: 30 mg/dL — ABNORMAL HIGH (ref 6–20)
CO2: 24 mmol/L (ref 22–32)
Calcium: 9.1 mg/dL (ref 8.9–10.3)
Chloride: 99 mmol/L (ref 98–111)
Creatinine, Ser: 1.16 mg/dL (ref 0.61–1.24)
GFR calc Af Amer: >60 mL/min (ref >60)
GFR calc non Af Amer: >60 mL/min (ref >60)
Glucose, Bld: 92 mg/dL (ref 70–99)
Potassium: 4.1 mmol/L (ref 3.5–5.1)
Sodium: 134 mmol/L — ABNORMAL LOW (ref 135–145)

## 2019-04-09 LAB — PROTIME-INR
INR: 1.5 — ABNORMAL HIGH (ref 0.8–1.2)
Prothrombin Time: 17.9 seconds — ABNORMAL HIGH (ref 11.4–15.2)

## 2019-04-09 LAB — CBC
HCT: 37.4 % — ABNORMAL LOW (ref 39.0–52.0)
Hemoglobin: 11.9 g/dL — ABNORMAL LOW (ref 13.0–17.0)
MCH: 26.3 pg (ref 26.0–34.0)
MCHC: 31.8 g/dL (ref 30.0–36.0)
MCV: 82.6 fL (ref 80.0–100.0)
Platelets: 185 10*3/uL (ref 150–400)
RBC: 4.53 MIL/uL (ref 4.22–5.81)
RDW: 18.3 % — ABNORMAL HIGH (ref 11.5–15.5)
WBC: 5.3 10*3/uL (ref 4.0–10.5)
nRBC: 0 % (ref 0.0–0.2)

## 2019-04-09 LAB — MRSA PCR SCREENING: MRSA by PCR: NEGATIVE

## 2019-04-09 LAB — HEPARIN LEVEL (UNFRACTIONATED): Heparin Unfractionated: 0.2 IU/mL — ABNORMAL LOW (ref 0.30–0.70)

## 2019-04-09 SURGERY — RIGHT HEART CATH
Anesthesia: LOCAL

## 2019-04-09 MED ORDER — HEPARIN (PORCINE) IN NACL 1000-0.9 UT/500ML-% IV SOLN
INTRAVENOUS | Status: AC
  Filled 2019-04-09: qty 500

## 2019-04-09 MED ORDER — MILRINONE LACTATE IN DEXTROSE 20-5 MG/100ML-% IV SOLN
0.2500 ug/kg/min | INTRAVENOUS | Status: DC
  Administered 2019-04-09 – 2019-04-10 (×2): 0.25 ug/kg/min via INTRAVENOUS
  Administered 2019-04-11: 0.375 ug/kg/min via INTRAVENOUS
  Administered 2019-04-11 – 2019-04-12 (×2): 0.25 ug/kg/min via INTRAVENOUS
  Filled 2019-04-09 (×5): qty 100

## 2019-04-09 MED ORDER — POTASSIUM CHLORIDE CRYS ER 20 MEQ PO TBCR
40.0000 meq | EXTENDED_RELEASE_TABLET | Freq: Once | ORAL | Status: AC
  Administered 2019-04-09: 40 meq via ORAL
  Filled 2019-04-09: qty 2

## 2019-04-09 MED ORDER — SODIUM CHLORIDE 0.9% FLUSH
3.0000 mL | Freq: Two times a day (BID) | INTRAVENOUS | Status: DC
  Administered 2019-04-12 – 2019-04-13 (×3): 3 mL via INTRAVENOUS

## 2019-04-09 MED ORDER — LIDOCAINE HCL (PF) 1 % IJ SOLN
INTRAMUSCULAR | Status: DC | PRN
  Administered 2019-04-09: 2 mL via INTRADERMAL

## 2019-04-09 MED ORDER — FUROSEMIDE 10 MG/ML IJ SOLN
80.0000 mg | Freq: Two times a day (BID) | INTRAMUSCULAR | Status: DC
  Administered 2019-04-09 – 2019-04-11 (×3): 80 mg via INTRAVENOUS
  Filled 2019-04-09 (×2): qty 8

## 2019-04-09 MED ORDER — HEPARIN (PORCINE) 25000 UT/250ML-% IV SOLN
1100.0000 [IU]/h | INTRAVENOUS | Status: DC
  Administered 2019-04-09: 900 [IU]/h via INTRAVENOUS
  Administered 2019-04-10: 1100 [IU]/h via INTRAVENOUS
  Filled 2019-04-09 (×2): qty 250

## 2019-04-09 MED ORDER — ASPIRIN 81 MG PO CHEW
81.0000 mg | CHEWABLE_TABLET | ORAL | Status: AC
  Administered 2019-04-09: 81 mg via ORAL
  Filled 2019-04-09: qty 1

## 2019-04-09 MED ORDER — LIDOCAINE HCL (PF) 1 % IJ SOLN
INTRAMUSCULAR | Status: AC
  Filled 2019-04-09: qty 30

## 2019-04-09 MED ORDER — SODIUM CHLORIDE 0.9 % IV SOLN: 250.0000 mL | INTRAVENOUS | Status: DC | PRN

## 2019-04-09 MED ORDER — SODIUM CHLORIDE 0.9% FLUSH
3.0000 mL | Freq: Two times a day (BID) | INTRAVENOUS | Status: DC
  Administered 2019-04-12: 22:00:00 via INTRAVENOUS
  Administered 2019-04-13: 3 mL via INTRAVENOUS

## 2019-04-09 MED ORDER — FUROSEMIDE 10 MG/ML IJ SOLN
INTRAMUSCULAR | Status: AC
  Filled 2019-04-09: qty 8

## 2019-04-09 MED ORDER — ASPIRIN EC 81 MG PO TBEC
81.0000 mg | DELAYED_RELEASE_TABLET | Freq: Every day | ORAL | Status: DC
  Administered 2019-04-10 – 2019-04-13 (×3): 81 mg via ORAL
  Filled 2019-04-09 (×4): qty 1

## 2019-04-09 MED ORDER — SODIUM CHLORIDE 0.9% FLUSH: 3.0000 mL | INTRAVENOUS | Status: DC | PRN

## 2019-04-09 MED ORDER — LABETALOL HCL 5 MG/ML IV SOLN: 10.0000 mg | INTRAVENOUS | Status: AC | PRN

## 2019-04-09 MED ORDER — DIGOXIN 125 MCG PO TABS
0.1250 mg | ORAL_TABLET | Freq: Every day | ORAL | Status: DC
  Administered 2019-04-09 – 2019-04-13 (×5): 0.125 mg via ORAL
  Filled 2019-04-09 (×5): qty 1

## 2019-04-09 MED ORDER — SODIUM CHLORIDE 0.9 % IV SOLN
INTRAVENOUS | Status: DC
  Administered 2019-04-09: 11:00:00 via INTRAVENOUS
  Administered 2019-04-09: 10 mL/h via INTRAVENOUS

## 2019-04-09 MED ORDER — HYDRALAZINE HCL 20 MG/ML IJ SOLN: 10.0000 mg | INTRAMUSCULAR | Status: AC | PRN

## 2019-04-09 MED ORDER — ALBUTEROL SULFATE (2.5 MG/3ML) 0.083% IN NEBU: 2.5000 mg | INHALATION_SOLUTION | RESPIRATORY_TRACT | Status: DC | PRN

## 2019-04-09 MED ORDER — ONDANSETRON HCL 4 MG/2ML IJ SOLN
4.0000 mg | Freq: Four times a day (QID) | INTRAMUSCULAR | Status: DC | PRN
  Administered 2019-04-11: 4 mg via INTRAVENOUS

## 2019-04-09 MED ORDER — PNEUMOCOCCAL VAC POLYVALENT 25 MCG/0.5ML IJ INJ
0.5000 mL | INJECTION | INTRAMUSCULAR | Status: AC
  Administered 2019-04-12: 12:00:00 0.5 mL via INTRAMUSCULAR
  Filled 2019-04-09: qty 0.5

## 2019-04-09 MED ORDER — MOMETASONE FURO-FORMOTEROL FUM 100-5 MCG/ACT IN AERO
2.0000 | INHALATION_SPRAY | Freq: Two times a day (BID) | RESPIRATORY_TRACT | Status: DC
  Administered 2019-04-10 – 2019-04-13 (×6): 2 via RESPIRATORY_TRACT
  Filled 2019-04-09 (×2): qty 8.8

## 2019-04-09 MED ORDER — ONDANSETRON HCL 4 MG/2ML IJ SOLN: 4.0000 mg | Freq: Four times a day (QID) | INTRAMUSCULAR | Status: DC | PRN

## 2019-04-09 MED ORDER — ACETAMINOPHEN 325 MG PO TABS
650.0000 mg | ORAL_TABLET | ORAL | Status: DC | PRN
  Administered 2019-04-11 (×2): 650 mg via ORAL
  Filled 2019-04-09 (×2): qty 2

## 2019-04-09 MED ORDER — SPIRONOLACTONE 25 MG PO TABS
25.0000 mg | ORAL_TABLET | Freq: Every day | ORAL | Status: DC
  Administered 2019-04-09 – 2019-04-11 (×3): 25 mg via ORAL
  Filled 2019-04-09 (×3): qty 1

## 2019-04-09 MED ORDER — FUROSEMIDE 10 MG/ML IJ SOLN
INTRAMUSCULAR | Status: DC | PRN
  Administered 2019-04-09: 80 mg via INTRAVENOUS

## 2019-04-09 MED ORDER — ACETAMINOPHEN 325 MG PO TABS: 650.0000 mg | ORAL_TABLET | ORAL | Status: DC | PRN

## 2019-04-09 SURGICAL SUPPLY — 9 items
CATH SWAN GANZ 7F STRAIGHT (CATHETERS) ×1 IMPLANT
GLIDESHEATH SLENDER 7FR .021G (SHEATH) ×1 IMPLANT
GUIDEWIRE .025 260CM (WIRE) ×1 IMPLANT
PACK CARDIAC CATHETERIZATION (CUSTOM PROCEDURE TRAY) ×2 IMPLANT
SHEATH GLIDE SLENDER 4/5FR (SHEATH) ×1 IMPLANT
SLEEVE REPOSITIONING LENGTH 30 (MISCELLANEOUS) ×1 IMPLANT
TRANSDUCER W/STOPCOCK (MISCELLANEOUS) ×2 IMPLANT
TUBING ART PRESS 72  MALE/FEM (TUBING) ×1
TUBING ART PRESS 72 MALE/FEM (TUBING) IMPLANT

## 2019-04-09 NOTE — Plan of Care (Signed)

## 2019-04-09 NOTE — Interval H&P Note (Signed)
History and Physical Interval Note:  04/09/2019 12:53 PM  Mark [name]  has presented today for surgery, with the diagnosis of heart failure.  The various methods of treatment have been discussed with the patient and family. After consideration of risks, benefits and other options for treatment, the patient has consented to  Procedure(s): RIGHT HEART CATH (N/A) as a surgical intervention.  The patient's history has been reviewed, patient examined, no change in status, stable for surgery.  I have reviewed the patient's chart and labs.  Questions were answered to the patient's satisfaction.     [name]

## 2019-04-09 NOTE — Progress Notes (Signed)
[facility] for Heparin Indication: hx apical thrombus  Allergies  Allergen Reactions  . Clindamycin/Lincomycin Swelling and Palpitations  . Hydrocodone Hives  . Lisinopril Swelling and Other (See Comments)    Facial swelling/angioedema  . Prednisone Shortness Of Breath, Nausea Only, Swelling and Other (See Comments)    Also made chest feel tight and genital area, legs, and face became swollen badly  . Bactrim [Sulfamethoxazole-Trimethoprim] Other (See Comments)    unknown  . Penicillins Hives and Swelling     Has patient had a PCN reaction causing immediate rash, facial/tongue/throat swelling, SOB or lightheadedness with hypotension: Yes Has patient had a PCN reaction causing severe rash involving mucus membranes or skin necrosis: No Has patient had a PCN reaction that required hospitalization: No Has patient had a PCN reaction occurring within the last 10 years: No If all of the above answers are "NO", then may proceed with Cephalosporin use.     Patient Measurements: Height: 5\' 8"  (172.7 cm) Weight: 152 lb 5.4 oz (69.1 kg) IBW/kg (Calculated) : 68.4 Heparin Dosing Weight: 62.1 kg  Vital Signs: Temp: 98.1 F (36.7 C) (08/18 2045) BP: 116/95 (08/18 2000)  Labs: Recent Labs    04/09/19 1051 04/09/19 1440 04/09/19 1442 04/09/19 1444 04/09/19 2214  HGB 11.9* 12.2* 12.9* 12.9*  --   HCT 37.4* 36.0* 38.0* 38.0*  --   PLT 185  --   --   --   --   LABPROT 17.9*  --   --   --   --   INR 1.5*  --   --   --   --   HEPARINUNFRC  --   --   --   --  0.20*  CREATININE 1.16  --   --   --   --     Estimated Creatinine Clearance: 77.8 mL/min (by C-G formula based on SCr of 1.16 mg/dL).   Medical History: Past Medical History:  Diagnosis Date  . Asthma   . Chronic systolic CHF (congestive heart failure) ([place])   . Cigarette smoker   . CKD (chronic kidney disease), stage II    Archie Endo 10/01/2017  . COPD (chronic obstructive pulmonary  disease) ([place]) 10/21/2017   on CT scan chest  . History of echocardiogram    a. Echo 5/17 - EF 20-25%, severe diff HK, restrictive physiology, mild to mod MR, severe reduced RVSF, mod RVE, mild RAE, mod TR, PASP 48 mmHg  . Hx of cardiac cath    a. LHC 5/17 - normal coronary arteries. PA 45/25, mean 33, PCWP mean 18  . NICM (nonischemic cardiomyopathy) ([place])   . Stroke ([place]) 09/27/2017   "was weak on my left side; I'm fully recovered" (11/09/2017)  . Substance abuse ([facility])    cocaine, marijuana    Medications:  Scheduled:  . [START ON 04/10/2019] aspirin  81 mg Oral Daily  . digoxin  0.125 mg Oral Daily  . furosemide  80 mg Intravenous BID  . mometasone-formoterol  2 puff Inhalation BID  . [START ON 04/10/2019] pneumococcal 23 valent vaccine  0.5 mL Intramuscular Tomorrow-1000  . sodium chloride flush  3 mL Intravenous Q12H  . sodium chloride flush  3 mL Intravenous Q12H  . spironolactone  25 mg Oral Daily    Assessment: 59 yom admitted with signs of low output and volume overload - on warfarin PTA for hx of apical thrombus. Last dose on 8/17. Plan for dental tooth extraction per Dr Enrique Sack in  future.   PTA regimen reported is 2.5 mg daily except 5 mg on Sunday (per last anti-coag appt regimen is 5 mg daily except 2.5 mg on Tuesday).   INR today on admission is subtherapeutic at 1.5. Plan to start heparin and hold warfarin while awaiting surgery. Hgb 11.9, plt 185. No s/sx of bleeding.  8/18 PM update: Heparin level below goal No issues per RN  Goal of Therapy:  INR 2-3 Heparin level 0.3-0.7 units/ml Monitor platelets by anticoagulation protocol: Yes   Plan:  Hold warfarin therapy while awaiting surgery Inc heparin to 1100 units/hr Re-check heparin level in 6-8 hours  Abran Duke, PharmD, BCPS Clinical Pharmacist Phone: [phone]

## 2019-04-09 NOTE — Progress Notes (Signed)
ANTICOAGULATION CONSULT NOTE - Initial Consult  Pharmacy Consult for heparin Indication: hx apical thrombus  Allergies  Allergen Reactions  . Clindamycin/Lincomycin Swelling and Palpitations  . Hydrocodone Hives  . Lisinopril Swelling and Other (See Comments)    Facial swelling/angioedema  . Prednisone Shortness Of Breath, Nausea Only, Swelling and Other (See Comments)    Also made chest feel tight and genital area, legs, and face became swollen badly  . Bactrim [Sulfamethoxazole-Trimethoprim] Other (See Comments)    unknown  . Penicillins Hives and Swelling     Has patient had a PCN reaction causing immediate rash, facial/tongue/throat swelling, SOB or lightheadedness with hypotension: Yes Has patient had a PCN reaction causing severe rash involving mucus membranes or skin necrosis: No Has patient had a PCN reaction that required hospitalization: No Has patient had a PCN reaction occurring within the last 10 years: No If all of the above answers are "NO", then may proceed with Cephalosporin use.     Patient Measurements: Height: 5\' 8"  (172.7 cm) Weight: 137 lb (62.1 kg) IBW/kg (Calculated) : 68.4 Heparin Dosing Weight: 62.1 kg  Vital Signs: Temp: 97.2 F (36.2 C) (08/18 1022) Temp Source: Skin (08/18 1022) BP: 110/80 (08/18 1022) Pulse Rate: 54 (08/18 1022)  Labs: Recent Labs    04/09/19 1051  HGB 11.9*  HCT 37.4*  PLT 185  LABPROT 17.9*  INR 1.5*  CREATININE 1.16    Estimated Creatinine Clearance: 70.6 mL/min (by C-G formula based on SCr of 1.16 mg/dL).   Medical History: Past Medical History:  Diagnosis Date  . Asthma   . Chronic systolic CHF (congestive heart failure) ([facility])   . Cigarette smoker   . CKD (chronic kidney disease), stage II    Mark [name] 10/01/2017  . COPD (chronic obstructive pulmonary disease) ([facility]) 10/21/2017   on CT scan chest  . History of echocardiogram    a. Echo 5/17 - EF 20-25%, severe diff HK, restrictive physiology, mild to mod  MR, severe reduced RVSF, mod RVE, mild RAE, mod TR, PASP 48 mmHg  . Hx of cardiac cath    a. LHC 5/17 - normal coronary arteries. PA 45/25, mean 33, PCWP mean 18  . NICM (nonischemic cardiomyopathy) ([facility])   . Stroke ([facility]) 09/27/2017   "was weak on my left side; I'm fully recovered" (11/09/2017)  . Substance abuse ([facility])    cocaine, marijuana    Medications:  Scheduled:  . [START ON 04/10/2019] aspirin  81 mg Oral Daily  . digoxin  0.125 mg Oral Daily  . furosemide  80 mg Intravenous BID  . mometasone-formoterol  2 puff Inhalation BID  . sodium chloride flush  3 mL Intravenous Q12H  . sodium chloride flush  3 mL Intravenous Q12H    Assessment: 43 yom admitted with signs of low output and volume overload - on warfarin PTA for hx of apical thrombus. Last dose on 8/17. Plan for dental tooth extraction per Dr Kristin Bruins in future.   PTA regimen reported is 2.5 mg daily except 5 mg on Sunday (per last anti-coag appt regimen is 5 mg daily except 2.5 mg on Tuesday).   INR today on admission is subtherapeutic at 1.5. Plan to start heparin and hold warfarin while awaiting surgery. Hgb 11.9, plt 185. No s/sx of bleeding.  Goal of Therapy:  INR 2-3 Heparin level 0.3-0.7 units/ml Monitor platelets by anticoagulation protocol: Yes   Plan:  Hold warfarin therapy while awaiting surgery Start heparin infusion at 900 units/hr Check anti-Xa level in 6  hours and daily while on heparin Continue to monitor H&H and platelets  Antonietta Jewel, PharmD, [place] Clinical Pharmacist  Pager: [phone] Phone: [phone] 04/09/2019,3:32 PM

## 2019-04-09 NOTE — H&P (Signed)
ADVANCED HF H&P NOTE  PCP:  Kerin Perna, NP          Cardiologist:  Lauree Chandler, MD Primary HF: Dr Haroldine Laws  HPI: Mark [name] is a 43 y.o. male with a history ofchronic systolic CHF (EF 31-54% with severe diffuse hypokinesis 12/2018,NICM by cardiac cath 2017, history of right MCA CVA 2019, asthma, and polysubstance use.  In 2017 he was diagnosed with new onset acute systolic heart failure. Cath at that time showed normal coronaries. He was started on HF meds. Unfortunately he was incarcerated 14 months until late 2018.    Admitted 12/2018 with increased dyspnea. He had run out of medications.  ECHO completed 12/2018 showed severely reduced LV/RVEF 5-10% with large apical thrombus and grade II DD. At that time he refused to go on coumadin.   Admitted to [facility] 01/24/2019 with increased dyspnea. UDS was + for cocaine. Diuresed with IV lasix. He had run out of his Hf medications. He was restarted back on HF medications.  Admitted  02/13/19 through 0/08/67 with A/C systolic heart failure. He also had dental pain and was placed on antibiotics. Dr Enrique Sack consulted. Planning for  multiple teeth extractions with general anesthesia.    Recently seen in clinic for pre-op eval and felt to be NYHA IIIb and very high risk for surgery. Set up for [place] prior to surgery. He is SOB with any activity. Denies orthopnea or PND.   RHC today with evidence of low output and volume overload. Admitted to ICU for inotropic support and IV diuresis  RA = 16 RV = 51/8 PA =  49/28 (38) PCW = 30 Fick cardiac output/index = 2.6/1.5 Thermo CO/CI = 3.42/1.9 PVR = 3.1 WU FA sat = 99% PA sat = 44%, 45% PaPI = 1.3 RA/PCWP = 0.53  He has difficulty obtaining medications and with transportation.   Cardiac Studies  ECHO 12/2018 EF 10-15% severely reduced RV and new apical thrombus.  ECHO 2019 EF 10-15%  Review of Systems: [y] = yes, [ ]  = no    General: Weight gain [] ; Weight loss [ ] ;  Anorexia [ ] ; Fatigue [y]; Fever [ ] ; Chills [ ] ; Weakness Blue.Reese ]   Cardiac: Chest pain/pressure [ ] ; Resting SOB [ y]; Exertional SOB [y]; Orthopnea [ ] ; Pedal Edema [] ; Palpitations [ ] ; Syncope [ ] ; Presyncope [ ] ; Paroxysmal nocturnal dyspnea[ ]    Pulmonary: Cough [ ] ; Wheezing[ ] ; Hemoptysis[ ] ; Sputum [ ] ; Snoring [ ]    GI: Vomiting[ ] ; Dysphagia[ ] ; Melena[ ] ; Hematochezia [ ] ; Heartburn[ ] ; Abdominal pain [ ] ; Constipation [ ] ; Diarrhea [ ] ; BRBPR [ ]    GU: Hematuria[ ] ; Dysuria [ ] ; Nocturia[ ]   Vascular: Pain in legs with walking [ ] ; Pain in feet with lying flat [ ] ; Non-healing sores [ ] ; Stroke [ ] ; TIA [ ] ; Slurred speech [ ] ;   Neuro: Headaches[ ] ; Vertigo[ ] ; Seizures[ ] ; Paresthesias[ ] ;Blurred vision [ ] ; Diplopia [ ] ; Vision changes [ ]    Ortho/Skin: Arthritis [y]; Joint pain [y]; Muscle pain [ ] ; Joint swelling [ ] ; Back Pain [ ] ; Rash [ ]    Psych: Depression[ ] ; Anxiety[ ]    Heme: Bleeding problems [ ] ; Clotting disorders [ ] ; Anemia [ ]    Endocrine: Diabetes [ ] ; Thyroid dysfunction[ ]    SH:  Social History   Socioeconomic History  . Marital status: Divorced    Spouse name: Not on file  . Number of children: Not on file  .  Years of education: Not on file  . Highest education level: Not on file  Occupational History  . Not on file  Social Needs  . Financial resource strain: Not on file  . Food insecurity    Worry: Not on file    Inability: Not on file  . Transportation needs    Medical: Not on file    Non-medical: Not on file  Tobacco Use  . Smoking status: Former Smoker    Packs/day: 1.00    Years: 30.00    Pack years: 30.00    Types: Cigarettes    Quit date: 09/27/2017    Years since quitting: 1.5  . Smokeless tobacco: Never Used  Substance and Sexual Activity  . Alcohol use: Yes    Alcohol/week: 5.0 standard drinks    Types: 5 Shots of liquor per week    Frequency: Never    Comment: 5-6 shots of vodka daily; "stopped it all after I  had stroke 09/27/2017"  . Drug use: Yes    Types: Cocaine, Marijuana    Comment: 11/09/2017 "none since 09/27/2017"  . Sexual activity: Not Currently  Lifestyle  . Physical activity    Days per week: Not on file    Minutes per session: Not on file  . Stress: Not on file  Relationships  . Social Musicianconnections    Talks on phone: Not on file    Gets together: Not on file    Attends religious service: Not on file    Active member of club or organization: Not on file    Attends meetings of clubs or organizations: Not on file    Relationship status: Not on file  . Intimate partner violence    Fear of current or ex partner: Not on file    Emotionally abused: Not on file    Physically abused: Not on file    Forced sexual activity: Not on file  Other Topics Concern  . Not on file  Social History Narrative  . Not on file    FH:  Family History  Problem Relation Age of Onset  . Cardiomyopathy Father        Reports his father has an LVAD  . Heart failure Father   . Hypertension Father   . Heart disease Maternal Grandmother        had a whole in her heart  . Deep vein thrombosis Neg Hx     Past Medical History:  Diagnosis Date  . Asthma   . Chronic systolic CHF (congestive heart failure) (HCC)   . Cigarette smoker   . CKD (chronic kidney disease), stage II    [name]/notes 10/01/2017  . COPD (chronic obstructive pulmonary disease) (HCC) 10/21/2017   on CT scan chest  . History of echocardiogram    a. Echo 5/17 - EF 20-25%, severe diff HK, restrictive physiology, mild to mod MR, severe reduced RVSF, mod RVE, mild RAE, mod TR, PASP 48 mmHg  . Hx of cardiac cath    a. LHC 5/17 - normal coronary arteries. PA 45/25, mean 33, PCWP mean 18  . NICM (nonischemic cardiomyopathy) (HCC)   . Stroke (HCC) 09/27/2017   "was weak on my left side; I'm fully recovered" (11/09/2017)  . Substance abuse (HCC)    cocaine, marijuana    Current Facility-Administered Medications  Medication Dose Route  Frequency Provider Last Rate Last Dose  . 0.9 %  sodium chloride infusion  250 mL Intravenous PRN Bensimhon, Bevelyn Bucklesaniel R, MD      .  0.9 %  sodium chloride infusion   Intravenous Continuous Bensimhon, Bevelyn Buckles, MD 10 mL/hr at 04/09/19 1106    . sodium chloride flush (NS) 0.9 % injection 3 mL  3 mL Intravenous PRN Bensimhon, Bevelyn Buckles, MD        Vitals:   04/09/19 1022 04/09/19 1433  BP: 110/80   Pulse: (!) 54   Resp: 14   Temp: (!) 97.2 F (36.2 C)   TempSrc: Skin   SpO2: 98% 100%  Weight: 62.1 kg   Height: 5\' 8"  (1.727 m)    Wt Readings from Last 3 Encounters:  04/09/19 62.1 kg  04/01/19 64.2 kg  03/27/19 63 kg    PHYSICAL EXAM: General:  Weak appearing. SOB at rest  HEENT: normal Neck: supple. JVP to ear  Carotids 2+ bilat; no bruits. No lymphadenopathy or thryomegaly appreciated. Cor: PMI laterally displaced. Regular rate & rhythm.+ s3 Lungs: clear Abdomen: soft, nontender, nondistended. No hepatosplenomegaly. No bruits or masses. Good bowel sounds. Extremities: no cyanosis, clubbing, rash, edema + cool  Neuro: alert & orientedx3, cranial nerves grossly intact. moves all 4 extremities w/o difficulty. Affect pleasant   ASSESSMENT & PLAN:  1. Acute/Chronic Biventricular Heart Failure - ECHO 12/2018 EF 10-15% Severely reduced RV.  - NYHA IIIb. - Cath today with low output physiology and marked volume overload. Admit to ICU for inotropic support and IV diuresis  - No bb with low output and recent cocaine abuse.  - Continue digoxin 0.125 mg daily.  - Continue spironolactone to 25 mg daily.   - Hold off on losartan. Keep off for now.  - No entresto with h/o angioedema.    2. Apical Thrombus - Identified on ECHO 12/2018. At that time he refused to take coumadin. - Hold coumadin. Heparin while in house  3. H/O MCA CVA 2019  - Continue  atorvastatin 40 mg daily. Continue aspirin 81 mg daily.   4. Cocaine Abuse - UDS + in June   5. Social  - He can't pay for  medications and does not have transportation to appointments. HFSW following. Continue HF Paramedicine  6. Tobacco Abuse - Discussed smoking cessation.   7. Periodontitis - Followed by Dr Kristin Bruins and needs multiple teeth extracted. Scheduled for possible Tuesday - Will attempt to optimize as best as possible for surgery later in the week.    Arvilla Meres MD 3:20 PM

## 2019-04-10 ENCOUNTER — Encounter (HOSPITAL_COMMUNITY): Payer: Self-pay | Admitting: Internal Medicine

## 2019-04-10 ENCOUNTER — Inpatient Hospital Stay (HOSPITAL_COMMUNITY): Payer: Medicaid Other

## 2019-04-10 DIAGNOSIS — E440 Moderate protein-calorie malnutrition: Secondary | ICD-10-CM | POA: Insufficient documentation

## 2019-04-10 LAB — BASIC METABOLIC PANEL
Anion gap: 9 (ref 5–15)
BUN: 21 mg/dL — ABNORMAL HIGH (ref 6–20)
CO2: 28 mmol/L (ref 22–32)
Calcium: 9.2 mg/dL (ref 8.9–10.3)
Chloride: 98 mmol/L (ref 98–111)
Creatinine, Ser: 1 mg/dL (ref 0.61–1.24)
GFR calc Af Amer: >60 mL/min (ref >60)
GFR calc non Af Amer: >60 mL/min (ref >60)
Glucose, Bld: 115 mg/dL — ABNORMAL HIGH (ref 70–99)
Potassium: 3.9 mmol/L (ref 3.5–5.1)
Sodium: 135 mmol/L (ref 135–145)

## 2019-04-10 LAB — COOXEMETRY PANEL
Carboxyhemoglobin: 1.3 % (ref 0.5–1.5)
Methemoglobin: 0.7 % (ref 0.0–1.5)
O2 Saturation: 57.7 %
Total hemoglobin: 11 g/dL — ABNORMAL LOW (ref 12.0–16.0)

## 2019-04-10 LAB — CBC
HCT: 35.8 % — ABNORMAL LOW (ref 39.0–52.0)
Hemoglobin: 11.4 g/dL — ABNORMAL LOW (ref 13.0–17.0)
MCH: 26 pg (ref 26.0–34.0)
MCHC: 31.8 g/dL (ref 30.0–36.0)
MCV: 81.7 fL (ref 80.0–100.0)
Platelets: 150 10*3/uL (ref 150–400)
RBC: 4.38 MIL/uL (ref 4.22–5.81)
RDW: 17.7 % — ABNORMAL HIGH (ref 11.5–15.5)
WBC: 5.8 10*3/uL (ref 4.0–10.5)
nRBC: 0 % (ref 0.0–0.2)

## 2019-04-10 LAB — PROTIME-INR
INR: 1.7 — ABNORMAL HIGH (ref 0.8–1.2)
Prothrombin Time: 20.1 seconds — ABNORMAL HIGH (ref 11.4–15.2)

## 2019-04-10 LAB — MAGNESIUM: Magnesium: 1.6 mg/dL — ABNORMAL LOW (ref 1.7–2.4)

## 2019-04-10 LAB — HEPARIN LEVEL (UNFRACTIONATED): Heparin Unfractionated: 0.35 IU/mL (ref 0.30–0.70)

## 2019-04-10 MED ORDER — MAGNESIUM SULFATE 4 GM/100ML IV SOLN
4.0000 g | Freq: Once | INTRAVENOUS | Status: AC
  Administered 2019-04-10: 10:00:00 4 g via INTRAVENOUS
  Filled 2019-04-10: qty 100

## 2019-04-10 MED ORDER — ENSURE ENLIVE PO LIQD
237.0000 mL | Freq: Two times a day (BID) | ORAL | Status: DC
  Administered 2019-04-10 – 2019-04-13 (×5): 237 mL via ORAL

## 2019-04-10 MED ORDER — ADULT MULTIVITAMIN W/MINERALS CH
1.0000 | ORAL_TABLET | Freq: Every day | ORAL | Status: DC
  Administered 2019-04-10 – 2019-04-13 (×3): 1 via ORAL
  Filled 2019-04-10 (×3): qty 1

## 2019-04-10 MED ORDER — CHLORHEXIDINE GLUCONATE CLOTH 2 % EX PADS
6.0000 | MEDICATED_PAD | Freq: Every day | CUTANEOUS | Status: DC
  Administered 2019-04-10 – 2019-04-12 (×2): 6 via TOPICAL

## 2019-04-10 MED ORDER — POTASSIUM CHLORIDE CRYS ER 20 MEQ PO TBCR
40.0000 meq | EXTENDED_RELEASE_TABLET | Freq: Once | ORAL | Status: AC
  Administered 2019-04-10: 40 meq via ORAL
  Filled 2019-04-10: qty 2

## 2019-04-10 MED FILL — Heparin Sod (Porcine)-NaCl IV Soln 1000 Unit/500ML-0.9%: INTRAVENOUS | Qty: 500 | Status: AC

## 2019-04-10 NOTE — Plan of Care (Signed)

## 2019-04-10 NOTE — Anesthesia Preprocedure Evaluation (Addendum)
Anesthesia Evaluation  Patient identified by MRN, date of birth, ID band Patient awake    Reviewed: Allergy & Precautions, NPO status , Patient's Chart, lab work & pertinent test results  History of Anesthesia Complications Negative for: history of anesthetic complications  Airway Mallampati: II  TM Distance: >3 FB Neck ROM: Full    Dental  (+) Poor Dentition   Pulmonary shortness of breath and at rest, asthma , COPD, former smoker,     + wheezing  rales    Cardiovascular +CHF (NICM, EF 5-10%, severely reduced RV function)  (-) CAD  Rhythm:Regular Rate:Normal  Apical thrombus on echo (12/2018), refused coumadin; has been on heparin while admitted   Neuro/Psych CVA, No Residual Symptoms negative psych ROS   GI/Hepatic negative GI ROS, (+)     substance abuse  cocaine use and marijuana use,   Endo/Other  negative endocrine ROS  Renal/GU Renal InsufficiencyRenal disease  negative genitourinary   Musculoskeletal negative musculoskeletal ROS (+)   Abdominal   Peds  Hematology  (+) anemia ,   Anesthesia Other Findings Currently admitted with decompensated HF, on milrinone 0.25 mcg/kg/min. Swan in place. Per Dr. Haroldine Laws he is currently optimized as much as possible for surgery.   Echo 12/25/18: - Moderate LV dilatation and severe systolic dysfunction with LVEF 5-10% and diffuse hypokinesis. 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 Grade 2 diastolic dysfunction with severely elevated filling pressures. - Right ventricle is severely dilated and now with severe systolic dysfunction (previously mild to moderate). RVSP is 42 mmHg consistent with moderate pulmonary hypertension, however underestimated in teh settings of severe RV dysfunction. RVSP previously 56 mmHg. - Moderate to severe mitral and severe tricuspid regurgitation.  Reproductive/Obstetrics                           Anesthesia Physical Anesthesia Plan  ASA: IV  Anesthesia Plan: MAC   Post-op Pain Management:    Induction:   PONV Risk Score and Plan: 1 and Ondansetron, Midazolam and Treatment may vary due to age or medical condition  Airway Management Planned: Nasal Cannula and Simple Face Mask  Additional Equipment: Arterial line  Intra-op Plan:   Post-operative Plan: Possible Post-op intubation/ventilation  Informed Consent: I have reviewed the patients History and Physical, chart, labs and discussed the procedure including the risks, benefits and alternatives for the proposed anesthesia with the patient or authorized representative who has indicated his/her understanding and acceptance.       Plan Discussed with:   Anesthesia Plan Comments: (Discussed with patient the very high risk nature of this anesthetic given his severe HF with acute exacerbation. Considering his situation and based on input from Dr. Haroldine Laws, we will start out with sedation and convert to general if patient is unable to tolerate.)       Anesthesia Quick Evaluation

## 2019-04-10 NOTE — Progress Notes (Addendum)
Advanced Heart Failure Rounding Note  PCP-Cardiologist: Lauree Chandler, MD   Subjective:     Admitted from cath lab for HF optimization prior to dental surgery. Diuresed with IV lasix and started on milrinone 0.25 mcg. CO-OX 58%.   Hungry. Denies SOB. Complaining of sinus pain.   CVP 8  PAP 56/30  CO 5.4/CI 3   RHC 04/09/19  RA = 16 RV = 51/8 PA =  49/28 (38) PCW = 30 Fick cardiac output/index = 2.6/1.5 Thermo CO/CI = 3.42/1.9 PVR = 3.1 WU FA sat = 99% PA sat = 44%, 45% PaPI = 1.3 RA/PCWP = 0.53 Assessment: 1. Severely decompensated HF with biventricular failure and elevated filling pressures  Objective:   Weight Range: 63.6 kg Body mass index is 21.32 kg/m.   Vital Signs:   Temp:  [97.2 F (36.2 C)-98.8 F (37.1 C)] 98.6 F (37 C) (08/19 0752) Pulse Rate:  [0-94] 0 (08/18 1507) Resp:  [0-35] 17 (08/19 0752) BP: (80-128)/(67-95) 89/74 (08/19 0700) SpO2:  [0 %-100 %] 98 % (08/19 0752) Weight:  [62.1 kg-69.1 kg] 63.6 kg (08/19 0448)    Weight change: Filed Weights   04/09/19 1022 04/09/19 1529 04/10/19 0448  Weight: 62.1 kg 69.1 kg 63.6 kg    Intake/Output:   Intake/Output Summary (Last 24 hours) at 04/10/2019 0754 Last data filed at 04/10/2019 0700 Gross per 24 hour  Intake 1499.72 ml  Output 6750 ml  Net -5250.28 ml      Physical Exam   CVP 8-9  General:  No resp difficulty HEENT: Normal Neck: Supple. JVP 8-9  . Carotids 2+ bilat; no bruits. No lymphadenopathy or thyromegaly appreciated. Cor: PMI nondisplaced. Regular rate & rhythm. No rubs, or murmurs. + S3  Lungs: Clear Abdomen: Soft, nontender, nondistended. No hepatosplenomegaly. No bruits or masses. Good bowel sounds. Extremities: warm, no cyanosis, clubbing, rash, edema.  RUE swan  Neuro: Alert & orientedx3, cranial nerves grossly intact. moves all 4 extremities w/o difficulty. Affect pleasant   Telemetry   Sinus Tach 100s   EKG    N/a   Labs    CBC Recent Labs     04/09/19 1051  04/09/19 1444 04/10/19 0717  WBC 5.3  --   --  5.8  HGB 11.9*   < > 12.9* 11.4*  HCT 37.4*   < > 38.0* 35.8*  MCV 82.6  --   --  81.7  PLT 185  --   --  150   < > = values in this interval not displayed.   Basic Metabolic Panel Recent Labs    04/09/19 1051  04/09/19 1442 04/09/19 1444  NA 134*   < > 138 138  K 4.1   < > 3.6 3.8  CL 99  --   --   --   CO2 24  --   --   --   GLUCOSE 92  --   --   --   BUN 30*  --   --   --   CREATININE 1.16  --   --   --   CALCIUM 9.1  --   --   --    < > = values in this interval not displayed.   Liver Function Tests No results for input(s): AST, ALT, ALKPHOS, BILITOT, PROT, ALBUMIN in the last 72 hours. No results for input(s): LIPASE, AMYLASE in the last 72 hours. Cardiac Enzymes No results for input(s): CKTOTAL, CKMB, CKMBINDEX, TROPONINI in the last 72 hours.  BNP: BNP (last 3 results) Recent Labs    12/28/18 0251 01/24/19 1955 02/13/19 2243  BNP 848.5* 2,792.5* 3,902.9*    ProBNP (last 3 results) No results for input(s): PROBNP in the last 8760 hours.   D-Dimer No results for input(s): DDIMER in the last 72 hours. Hemoglobin A1C No results for input(s): HGBA1C in the last 72 hours. Fasting Lipid Panel No results for input(s): CHOL, HDL, LDLCALC, TRIG, CHOLHDL, LDLDIRECT in the last 72 hours. Thyroid Function Tests No results for input(s): TSH, T4TOTAL, T3FREE, THYROIDAB in the last 72 hours.  Invalid input(s): FREET3  Other results:   Imaging    Dg Chest Port 1 View  Result Date: 04/10/2019 CLINICAL DATA:  Dyspnea EXAM: PORTABLE CHEST 1 VIEW COMPARISON:  02/13/2019 FINDINGS: Cardiac shadow is enlarged. Swan-Ganz catheter is noted in the right pulmonary artery. The lungs are hyperinflated consistent with COPD. No focal infiltrate or sizable effusion is noted. No acute bony abnormality is noted. Stable exostosis is noted from the anterior aspect of the right seventh rib. IMPRESSION: COPD without  acute abnormality. Electronically Signed   By: Inez Catalina M.D.   On: 04/10/2019 07:01      Medications:     Scheduled Medications: . aspirin EC  81 mg Oral Daily  . digoxin  0.125 mg Oral Daily  . furosemide  80 mg Intravenous BID  . mometasone-formoterol  2 puff Inhalation BID  . pneumococcal 23 valent vaccine  0.5 mL Intramuscular Tomorrow-1000  . sodium chloride flush  3 mL Intravenous Q12H  . sodium chloride flush  3 mL Intravenous Q12H  . spironolactone  25 mg Oral Daily     Infusions: . sodium chloride    . sodium chloride 10 mL/hr at 04/10/19 0700  . sodium chloride    . sodium chloride    . heparin 1,100 Units/hr (04/10/19 0700)  . milrinone 0.25 mcg/kg/min (04/10/19 0746)     PRN Medications:  sodium chloride, sodium chloride, sodium chloride, acetaminophen, albuterol, ondansetron (ZOFRAN) IV, sodium chloride flush, sodium chloride flush, sodium chloride flush     Assessment/Plan  1. Acute/Chronic Biventricular Heart Failure - ECHO 12/2018 EF 10-15% Severely reduced RV.  - NYHA IIIb. - Had cath with low output physiology and marked volume overload. Started on milrinone 0.125 mcg. CO-OX 58%  CVP coming down. Negative 5 liters. Volume status improving. Give one more dose of IV lasix. Follow for CVP.  - No bb with low output and recent cocaine abuse.  - Continue digoxin 0.125 mg daily.  - Continue spironolactone to 25 mg daily.   - No Arb with hypotension.   - No entresto with h/o angioedema.  - I dont think he is a candidate for given ongoing drug use.   2. Apical Thrombus - Identified on ECHO 12/2018. At that time he refused to take coumadin. - Hold coumadin. Heparin while in house Pharmacy dosing heparin.   3. H/O MCA CVA 2019  - Continue atorvastatin 40 mg daily. Continue aspirin 81 mg daily.   4. Cocaine Abuse - UDS + in June   5. Social  - He can't pay for medications and does not have transportation to appointments. HFSW following.  Continue HF Paramedicine  6. Tobacco Abuse - Discussed smoking cessation.   7. Periodontitis - Followed by Dr Enrique Sack and needs multiple teeth extracted. Scheduled for possible Thursday.   Length of Stay: 1  Darrick Grinder, NP  04/10/2019, 7:54 AM  Advanced Heart Failure Team Pager [phone] (M-F; 7a -  4p)  Please contact [facility] for night-coverage after hours (4p -7a ) and weekends on [email or web address]  Agree with above.  Admitted yesterday from cath lab with cardiogenic shock physiology. Now on milrinone and IV diuresis. Hemodynamics much improved. Still with mild volume overload but cardiac output restored. On heaprin. No bleeding  In bed NAD JVP 8-9 Cor RRR + s3 Lungs clear Ab soft NT Ext warm No c/c/e RUE Swan  Hemodynamics much improved with inotrope support.  I think this is good as we can get him prior to surgery. Still tenuous though and surgery will still entail some risk. I d/w Dr. Enrique Sack and would recommend avoiding GA if at all possible and going with conscious sedation or MAC but if GA needed ok.   Long discussion with Mr. Monestime about situation and overall poor prognosis and need to consider lifestyle changes to permit advanced therapies.   CRITICAL CARE Performed by: Glori Bickers  Total critical care time: 35 minutes  Critical care time was exclusive of separately billable procedures and treating other patients.  Critical care was necessary to treat or prevent imminent or life-threatening deterioration.  Critical care was time spent personally by me (independent of midlevel providers or residents) on the following activities: development of treatment plan with patient and/or surrogate as well as nursing, discussions with consultants, evaluation of patient's response to treatment, examination of patient, obtaining history from patient or surrogate, ordering and performing treatments and interventions, ordering and review of laboratory studies, ordering and  review of radiographic studies, pulse oximetry and re-evaluation of patient's condition.  Glori Bickers, MD  11:08 AM

## 2019-04-10 NOTE — Progress Notes (Signed)
Initial Nutrition Assessment  DOCUMENTATION CODES:   Non-severe (moderate) malnutrition in context of chronic illness  INTERVENTION:   - Ensure Enlive po BID, each supplement provides 350 kcal and 20 grams of protein  - MVI with minerals daily  - Encourage continued adequate PO intake  NUTRITION DIAGNOSIS:   Moderate Malnutrition related to chronic illness (CHF) as evidenced by mild fat depletion, mild muscle depletion, moderate muscle depletion.  GOAL:   Patient will meet greater than or equal to 90% of their needs  MONITOR:   PO intake, Supplement acceptance, Labs, Weight trends, I & O's  REASON FOR ASSESSMENT:   Malnutrition Screening Tool    ASSESSMENT:   43 year old male who presented on 8/18 for right heart cath. PMH of CHF, CVA, asthma, and polysubstance abuse. Pt admitted for HF optimization prior to dental surgery.   Pt discussed with RN and during ICU rounds. Noted plan for dental extractions tomorrow.  Spoke with pt at bedside. Pt lethargic but otherwise in good spirits. Pt reports he is nervous for his surgeries tomorrow.  Pt reports having a good appetite. Pt states "they starved me yesterday and they're going to tomorrow because of my surgery." Noted pt was NPO yesterday until 1529.  Pt states he ate very well at breakfast this morning and still feels hungry. Pt is willing to consume an oral nutrition supplement to aid in meeting kcal and protein needs. RD provided pt with a chocolate Ensure Enlive at time of visit. RN aware.  Pt reports that PTA, he ate well and had 3 meals daily. Pt denies any issues with his appetite PTA.  Pt reports that his weight fluctuates based on fluid status. Pt reports that during the pandemic, he has had trouble getting to appointments and obtaining medications which has made him gain fluid weight. Pt reports his UBW is between 130-140 lbs.  Pt reports that prior to his stroke in early 2019, he weighed 190 lbs. Pt reports  losing weight since his stroke but that now it has stabilized. Reviewed weight history in chart. Pt's weight has fluctuated between 63-71 kg over the last year. No real trend in either direction.  Meal Completion: 100% x 2 meals  Medications reviewed and include: spironolactone, heparin, magnesium sulfate 4 grams once, milrinone IVF: NS @ 10 ml/hr  Labs reviewed: magnesium 1.6  UOP: 6750 ml x 24 hours I/O's: -5.9 L since admit  NUTRITION - FOCUSED PHYSICAL EXAM:    Most Recent Value  Orbital Region  Mild depletion  Upper Arm Region  Mild depletion  Thoracic and Lumbar Region  Mild depletion  Buccal Region  No depletion  Temple Region  No depletion  Clavicle Bone Region  Moderate depletion  Clavicle and Acromion Bone Region  Moderate depletion  Scapular Bone Region  Moderate depletion  Dorsal Hand  No depletion  Patellar Region  No depletion  Anterior Thigh Region  Mild depletion  Posterior Calf Region  Mild depletion  Edema (RD Assessment)  None  Hair  Reviewed  Eyes  Reviewed  Mouth  Reviewed  Skin  Reviewed  Nails  Reviewed       Diet Order:   Diet Order            Diet Heart Room service appropriate? Yes; Fluid consistency: Thin  Diet effective now              EDUCATION NEEDS:   Education needs have been addressed  Skin:  Skin Assessment: Reviewed RN Assessment  Last BM:  no documented BM  Height:   Ht Readings from Last 1 Encounters:  04/09/19 5\' 8"  (1.727 m)    Weight:   Wt Readings from Last 1 Encounters:  04/10/19 63.6 kg    Ideal Body Weight:  70 kg  BMI:  Body mass index is 21.32 kg/m.  Estimated Nutritional Needs:   Kcal:  2100-2300  Protein:  100-115 grams  Fluid:  2.0 L    Earma Reading, MS, RD, LDN Inpatient Clinical Dietitian Pager: [phone] Weekend/After Hours: [phone]

## 2019-04-10 NOTE — Progress Notes (Signed)
ANTICOAGULATION CONSULT NOTE   Pharmacy Consult for Heparin Indication: hx apical thrombus  Allergies  Allergen Reactions  . Clindamycin/Lincomycin Swelling and Palpitations  . Hydrocodone Hives  . Lisinopril Swelling and Other (See Comments)    Facial swelling/angioedema  . Prednisone Shortness Of Breath, Nausea Only, Swelling and Other (See Comments)    Also made chest feel tight and genital area, legs, and face became swollen badly  . Bactrim [Sulfamethoxazole-Trimethoprim] Other (See Comments)    unknown  . Penicillins Hives and Swelling     Has patient had a PCN reaction causing immediate rash, facial/tongue/throat swelling, SOB or lightheadedness with hypotension: Yes Has patient had a PCN reaction causing severe rash involving mucus membranes or skin necrosis: No Has patient had a PCN reaction that required hospitalization: No Has patient had a PCN reaction occurring within the last 10 years: No If all of the above answers are "NO", then may proceed with Cephalosporin use.     Patient Measurements: Height: 5\' 8"  (172.7 cm) Weight: 140 lb 4 oz (63.6 kg) IBW/kg (Calculated) : 68.4 Heparin Dosing Weight: 62.1 kg  Vital Signs: Temp: 98.4 F (36.9 C) (08/19 0800) Temp Source: Core (08/19 0800) BP: 89/74 (08/19 0700)  Labs: Recent Labs    04/09/19 1051  04/09/19 1442 04/09/19 1444 04/09/19 2214 04/10/19 0717  HGB 11.9*   < > 12.9* 12.9*  --  11.4*  HCT 37.4*   < > 38.0* 38.0*  --  35.8*  PLT 185  --   --   --   --  150  LABPROT 17.9*  --   --   --   --  20.1*  INR 1.5*  --   --   --   --  1.7*  HEPARINUNFRC  --   --   --   --  0.20* 0.35  CREATININE 1.16  --   --   --   --  1.00   < > = values in this interval not displayed.    Estimated Creatinine Clearance: 83.9 mL/min (by C-G formula based on SCr of 1 mg/dL).   Medical History: Past Medical History:  Diagnosis Date  . Asthma   . Chronic systolic CHF (congestive heart failure) (HCC)   . Cigarette  smoker   . CKD (chronic kidney disease), stage II    Mark [name] 10/01/2017  . COPD (chronic obstructive pulmonary disease) (HCC) 10/21/2017   on CT scan chest  . History of echocardiogram    a. Echo 5/17 - EF 20-25%, severe diff HK, restrictive physiology, mild to mod MR, severe reduced RVSF, mod RVE, mild RAE, mod TR, PASP 48 mmHg  . Hx of cardiac cath    a. LHC 5/17 - normal coronary arteries. PA 45/25, mean 33, PCWP mean 18  . NICM (nonischemic cardiomyopathy) (HCC)   . Stroke (HCC) 09/27/2017   "was weak on my left side; I'm fully recovered" (11/09/2017)  . Substance abuse (HCC)    cocaine, marijuana    Medications:  Scheduled:  . aspirin EC  81 mg Oral Daily  . digoxin  0.125 mg Oral Daily  . mometasone-formoterol  2 puff Inhalation BID  . pneumococcal 23 valent vaccine  0.5 mL Intramuscular Tomorrow-1000  . potassium chloride  40 mEq Oral Once  . sodium chloride flush  3 mL Intravenous Q12H  . sodium chloride flush  3 mL Intravenous Q12H  . spironolactone  25 mg Oral Daily    Assessment: 43 yom admitted with mixed LV/RV dysfunction -  on warfarin PTA for hx of apical thrombus. Last dose on 8/17. Plan for dental tooth extraction per Dr Enrique Sack on 8/20.  PTA regimen reported is 2.5 mg daily except 5 mg on Sunday (per last anti-coag appt regimen is 5 mg daily except 2.5 mg on Tuesday).   INR today on admission is subtherapeutic at 1.5. Started heparin and held warfarin while awaiting surgery. Hgb stable 11.4, plt 150. No s/sx of bleeding.  Heparin level therapeutic at 0.35  Goal of Therapy:  INR 2-3 Heparin level 0.3-0.7 units/ml Monitor platelets by anticoagulation protocol: Yes   Plan:  Hold warfarin therapy while awaiting surgery Continue heparin at 1100 units/hr Daily heparin level, CBC Follow up anticoagulation plans post-procedure  Vertis Kelch, PharmD PGY2 Cardiology Pharmacy Resident Phone [phone] 04/10/2019       9:22 AM  Please check [email or web address] for  unit-specific pharmacist phone numbers

## 2019-04-10 NOTE — Progress Notes (Signed)
04/10/2019  Patient:            Mark [name] Date of Birth:  1976/04/06 MRN:                [id number]   BP (!) 89/74   Pulse (!) 0   Temp 98.4 F (36.9 C) (Core)   Resp (!) 25   Ht 5\' 8"  (1.727 m)   Wt 63.6 kg   SpO2 97%   BMI 21.32 kg/m    COVID 19 SCREENING: The patient does not symptoms concerning for COVID-19 infection (Including fever, chills, cough, or new SHORTNESS OF BREATH).   Patient Active Problem List   Diagnosis Date Noted  . Cardiogenic shock (HCC) 04/09/2019  . Allergic reaction caused by a drug 02/14/2019  . Abnormal liver function 02/14/2019  . Encounter for therapeutic drug monitoring 02/13/2019  . Encounter to establish care 02/13/2019  . 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 Acute CHF (congestive heart failure) (HCC) 01/24/2019  . Shortness of breath   . Cocaine use   . Acute on chronic systolic CHF (congestive heart failure) (HCC) 12/25/2018  . Demand ischemia (HCC)   . LV (left ventricular) mural thrombus without MI   . Noncompliance   . Chronic anemia   . Acute systolic CHF (congestive heart failure) (HCC) 11/09/2017  . History of right MCA stroke 09/28/2017  . Stroke (HCC) 09/28/2017  . Chronic systolic heart failure (HCC) 01/11/2016  . NICM (nonischemic cardiomyopathy) (HCC) 01/11/2016  . CKD (chronic kidney disease) stage 2, GFR 60-89 ml/min 01/11/2016  . Cocaine abuse (HCC) 01/11/2016  . Chest pain, pleuritic 01/03/2016  . Tobacco abuse 01/03/2016  . Asthma 01/03/2016  . Leg swelling 01/03/2016  . Tachycardia 01/03/2016  . Normocytic anemia 01/03/2016  . Elevated troponin I level 01/03/2016  . Right rib fracture 01/03/2016  . Chest pain     Past Medical History:  Diagnosis Date  . Asthma   . Chronic systolic CHF (congestive heart failure) (HCC)   . Cigarette smoker   . CKD (chronic kidney disease), stage II    [name] 10/01/2017  . COPD (chronic obstructive pulmonary disease) (HCC) 10/21/2017   on CT  scan chest  . History of echocardiogram    a. Echo 5/17 - EF 20-25%, severe diff HK, restrictive physiology, mild to mod MR, severe reduced RVSF, mod RVE, mild RAE, mod TR, PASP 48 mmHg  . Hx of cardiac cath    a. LHC 5/17 - normal coronary arteries. PA 45/25, mean 33, PCWP mean 18  . NICM (nonischemic cardiomyopathy) (HCC)   . Stroke (HCC) 09/27/2017   "was weak on my left side; I'm fully recovered" (11/09/2017)  . Substance abuse (HCC)    cocaine, marijuana    Allergies  Allergen Reactions  . Clindamycin/Lincomycin Swelling and Palpitations  . Hydrocodone Hives  . Lisinopril Swelling and Other (See Comments)    Facial swelling/angioedema  . Prednisone Shortness Of Breath, Nausea Only, Swelling and Other (See Comments)    Also made chest feel tight and genital area, legs, and face became swollen badly  . Bactrim [Sulfamethoxazole-Trimethoprim] Other (See Comments)    unknown  . Penicillins Hives and Swelling     Has patient had a PCN reaction causing immediate rash, facial/tongue/throat swelling, SOB or lightheadedness with hypotension: Yes Has patient had a PCN reaction causing severe rash involving mucus membranes or skin necrosis: No Has patient had a PCN reaction that required hospitalization: No Has patient  had a PCN reaction occurring within the last 10 years: No If all of the above answers are "NO", then may proceed with Cephalosporin use.     Current Facility-Administered Medications  Medication Dose Route Frequency Provider Last Rate Last Dose  . 0.9 %  sodium chloride infusion  250 mL Intravenous PRN Clegg, Amy D, NP      . 0.9 %  sodium chloride infusion   Intravenous Continuous Clegg, Amy D, NP 10 mL/hr at 04/10/19 0930    . 0.9 %  sodium chloride infusion  250 mL Intravenous PRN Clegg, Amy D, NP      . 0.9 %  sodium chloride infusion  250 mL Intravenous PRN Bensimhon, Shaune Pascal, MD      . acetaminophen (TYLENOL) tablet 650 mg  650 mg Oral Q4H PRN Bensimhon, Shaune Pascal,  MD      . albuterol (PROVENTIL) (2.5 MG/3ML) 0.083% nebulizer solution 2.5 mg  2.5 mg Nebulization Q4H PRN Clegg, Amy D, NP      . aspirin EC tablet 81 mg  81 mg Oral Daily Clegg, Amy D, NP   81 mg at 04/10/19 0929  . digoxin (LANOXIN) tablet 0.125 mg  0.125 mg Oral Daily Clegg, Amy D, NP   0.125 mg at 04/10/19 0929  . heparin ADULT infusion 100 units/mL (25000 units/247mL sodium chloride 0.45%)  1,100 Units/hr Intravenous Continuous Erenest Blank, RPH 11 mL/hr at 04/10/19 0930 1,100 Units/hr at 04/10/19 0930  . magnesium sulfate IVPB 4 g 100 mL  4 g Intravenous Once Bensimhon, Shaune Pascal, MD 50 mL/hr at 04/10/19 0930 4 g at 04/10/19 0930  . milrinone (PRIMACOR) 20 MG/100 ML (0.2 mg/mL) infusion  0.25 mcg/kg/min Intravenous Continuous Bensimhon, Shaune Pascal, MD 4.66 mL/hr at 04/10/19 0930 0.25 mcg/kg/min at 04/10/19 0930  . mometasone-formoterol (DULERA) 100-5 MCG/ACT inhaler 2 puff  2 puff Inhalation BID Clegg, Amy D, NP   2 puff at 04/10/19 0751  . ondansetron (ZOFRAN) injection 4 mg  4 mg Intravenous Q6H PRN Bensimhon, Shaune Pascal, MD      . pneumococcal 23 valent vaccine (PNU-IMMUNE) injection 0.5 mL  0.5 mL Intramuscular Tomorrow-1000 Bensimhon, Daniel R, MD      . sodium chloride flush (NS) 0.9 % injection 3 mL  3 mL Intravenous PRN Clegg, Amy D, NP      . sodium chloride flush (NS) 0.9 % injection 3 mL  3 mL Intravenous Q12H Clegg, Amy D, NP      . sodium chloride flush (NS) 0.9 % injection 3 mL  3 mL Intravenous PRN Clegg, Amy D, NP      . sodium chloride flush (NS) 0.9 % injection 3 mL  3 mL Intravenous Q12H Bensimhon, Shaune Pascal, MD      . sodium chloride flush (NS) 0.9 % injection 3 mL  3 mL Intravenous PRN Bensimhon, Shaune Pascal, MD      . spironolactone (ALDACTONE) tablet 25 mg  25 mg Oral Daily Bensimhon, Shaune Pascal, MD   25 mg at 04/10/19 7425   Mark [name] is a 42 year old male with acute on chronic heart failure and is followed by Dr. Glori Bickers. Patient has a history of toothache  symptoms and was previously evaluated at [facility] long hospital on 02/18/2019. Patient was planned for multiple dental extractions with alveoloplasty at that time sending cardiac clearance by Dr. Haroldine Laws.  Patient was seen for follow-up evaluation by Dr. Haroldine Laws on 03/27/2019.  It was determined that patient would need to  be admitted for cardiac catheterization and optimization of his heart failure prior to dental surgery.  Patient was admitted after cardiac catheterization on Tuesday, 04/09/2019.  Patient was found to be in cardiogenic shock and has been placed on milrinone and diuretics to reduce volume overload.  I had a discussion with Dr. Gala RomneyBensimhon today and patient was cleared for dental surgery on Thursday, 04/11/2019.  Previous discussion with anesthesiology indicated that use of general anesthesia was recommended over IV sedation.  I have requested anesthesia consultation at this time to further evaluate the patient for use of general anesthesia.  SUBJECTIVE: Patient currently denies acute toothaches, swellings, or abscesses.  Patient indicates that the upper left quadrant has been hurting from time to time.  Patient was seen as an outpatient on 03/11/2019 by Dr. Gala RomneyBensimhon who prescribed additional doses of Levaquin for 7 days at that time for persistent dental problems.  Patient indictes that antibiotics did treat the problem at that time.    OBJECTIVE: There is no obvious change in patient's dental condition.  There is no obvious intraoral swellings noted at this time.    ASSESSMENT: 1.  Acute on chronic heart failure with ejection fraction in the 10 to 15% range 2.  Chronic anticoagulation currently changed from Coumadin to heparin therapy for this inpatient admission and surgery 3.  History of acute pulpitis 4.  Chronic apical periodontitis 5.  Dental caries 6.  Chronic periodontitis 7.  Accretions 8.  Gingival recession 9.  Loose teeth 10.  Missing teeth 11.  Poor occlusal scheme and  malocclusion 12.  Risk for bleeding with invasive dental procedures 13.  Risk for complications with anticipated invasive dental procedures in the operating with general anesthesia up to and including death due to his overall cardiovascular and respiratory compromise.  Plan/recommendations: 1.  I discussed the risks, benefits, complications of various treatment options with the patient in relationship to his medical and dental conditions, significant heart failure and risk for potential complications up to and including death.  We discussed no treatment, multiple extractions with alveoloplasty, dental therapy, dental restorations, root canal therapy, chronic bridge therapy, implant therapy, and evaluation for replacing the missing teeth as indicated.  The patient currently wishes to proceed with extraction of tooth numbers 2, 4, 5, 10, 12 with alveoloplasty in the operating with general anesthesia and consideration of extraction of tooth #3 and other teeth as indicated after further evaluation in the operating room.  Patient will then follow-up with a dentist of his choice for continued periodontal therapy, dental restorations, and evaluation for replacing the missing teeth as indicated.  The patient is aware that the operating room procedure has been scheduled for tomorrow morning at 7:30 AM.  Patient is to be n.p.o. after midnight and we will hold the heparin therapy 6 hours prior to dental surgery.  Heparin therapy may be restarted no earlier than 12 hours after the surgery with consideration of starting the warfarin therapy in the evening instead of the heparin therapy.   Charlynne Panderonald F Arrie Borrelli, DDS

## 2019-04-11 ENCOUNTER — Inpatient Hospital Stay (HOSPITAL_COMMUNITY): Payer: Medicaid Other | Admitting: Certified Registered Nurse Anesthetist

## 2019-04-11 ENCOUNTER — Inpatient Hospital Stay (HOSPITAL_COMMUNITY): Admission: RE | Admit: 2019-04-11 | Payer: Medicaid Other | Source: Home / Self Care | Admitting: Dentistry

## 2019-04-11 ENCOUNTER — Encounter (HOSPITAL_COMMUNITY)
Admission: RE | Disposition: A | Discharge status: Home / Self Care | Payer: Self-pay | Source: Home / Self Care | Attending: Internal Medicine

## 2019-04-11 HISTORY — PX: MULTIPLE EXTRACTIONS WITH ALVEOLOPLASTY: SHX5342

## 2019-04-11 LAB — CBC
HCT: 36.8 % — ABNORMAL LOW (ref 39.0–52.0)
Hemoglobin: 11.6 g/dL — ABNORMAL LOW (ref 13.0–17.0)
MCH: 25.6 pg — ABNORMAL LOW (ref 26.0–34.0)
MCHC: 31.5 g/dL (ref 30.0–36.0)
MCV: 81.2 fL (ref 80.0–100.0)
Platelets: 155 10*3/uL (ref 150–400)
RBC: 4.53 MIL/uL (ref 4.22–5.81)
RDW: 17.7 % — ABNORMAL HIGH (ref 11.5–15.5)
WBC: 4.9 10*3/uL (ref 4.0–10.5)
nRBC: 0 % (ref 0.0–0.2)

## 2019-04-11 LAB — COOXEMETRY PANEL
Carboxyhemoglobin: 1.6 % — ABNORMAL HIGH (ref 0.5–1.5)
Carboxyhemoglobin: 1 % (ref 0.5–1.5)
Carboxyhemoglobin: 1.4 % (ref 0.5–1.5)
Methemoglobin: 0.7 % (ref 0.0–1.5)
Methemoglobin: 0.8 % (ref 0.0–1.5)
Methemoglobin: 1.2 % (ref 0.0–1.5)
O2 Saturation: 93.2 %
O2 Saturation: 47.2 %
O2 Saturation: 46.9 %
Total hemoglobin: 11.8 g/dL — ABNORMAL LOW (ref 12.0–16.0)
Total hemoglobin: 12.1 g/dL (ref 12.0–16.0)
Total hemoglobin: 12 g/dL (ref 12.0–16.0)

## 2019-04-11 LAB — BASIC METABOLIC PANEL
Anion gap: 9 (ref 5–15)
BUN: 17 mg/dL (ref 6–20)
CO2: 28 mmol/L (ref 22–32)
Calcium: 9.1 mg/dL (ref 8.9–10.3)
Chloride: 97 mmol/L — ABNORMAL LOW (ref 98–111)
Creatinine, Ser: 0.88 mg/dL (ref 0.61–1.24)
GFR calc Af Amer: >60 mL/min (ref >60)
GFR calc non Af Amer: >60 mL/min (ref >60)
Glucose, Bld: 97 mg/dL (ref 70–99)
Potassium: 4.6 mmol/L (ref 3.5–5.1)
Sodium: 134 mmol/L — ABNORMAL LOW (ref 135–145)

## 2019-04-11 LAB — PROTIME-INR
INR: 1.5 — ABNORMAL HIGH (ref 0.8–1.2)
Prothrombin Time: 17.7 seconds — ABNORMAL HIGH (ref 11.4–15.2)

## 2019-04-11 LAB — HEPARIN LEVEL (UNFRACTIONATED): Heparin Unfractionated: 0.33 IU/mL (ref 0.30–0.70)

## 2019-04-11 SURGERY — MULTIPLE EXTRACTION WITH ALVEOLOPLASTY
Anesthesia: Monitor Anesthesia Care | Site: Mouth | Laterality: Bilateral

## 2019-04-11 MED ORDER — FENTANYL CITRATE (PF) 100 MCG/2ML IJ SOLN: 25.0000 ug | INTRAMUSCULAR | Status: DC | PRN

## 2019-04-11 MED ORDER — AMINOCAPROIC ACID SOLUTION 5% (50 MG/ML)
ORAL | Status: DC | PRN
  Administered 2019-04-11: 10 mL via ORAL

## 2019-04-11 MED ORDER — MIDAZOLAM HCL 5 MG/5ML IJ SOLN
INTRAMUSCULAR | Status: DC | PRN
  Administered 2019-04-11 (×2): 1 mg via INTRAVENOUS

## 2019-04-11 MED ORDER — FENTANYL CITRATE (PF) 100 MCG/2ML IJ SOLN
25.0000 ug | INTRAMUSCULAR | Status: DC | PRN
  Administered 2019-04-11: 25 ug via INTRAVENOUS

## 2019-04-11 MED ORDER — OXYCODONE HCL 5 MG PO TABS: 5.0000 mg | ORAL_TABLET | Freq: Once | ORAL | Status: DC | PRN

## 2019-04-11 MED ORDER — LACTATED RINGERS IV SOLN
INTRAVENOUS | Status: DC
  Administered 2019-04-11: 11:00:00 via INTRAVENOUS

## 2019-04-11 MED ORDER — ONDANSETRON HCL 4 MG/2ML IJ SOLN: 4.0000 mg | Freq: Once | INTRAMUSCULAR | Status: DC | PRN

## 2019-04-11 MED ORDER — LIDOCAINE-EPINEPHRINE 2 %-1:100000 IJ SOLN
INTRAMUSCULAR | Status: AC
  Filled 2019-04-11: qty 10.2

## 2019-04-11 MED ORDER — FENTANYL CITRATE (PF) 250 MCG/5ML IJ SOLN
INTRAMUSCULAR | Status: DC | PRN
  Administered 2019-04-11 (×2): 25 ug via INTRAVENOUS

## 2019-04-11 MED ORDER — BUPIVACAINE-EPINEPHRINE (PF) 0.5% -1:200000 IJ SOLN
INTRAMUSCULAR | Status: AC
  Filled 2019-04-11: qty 3.6

## 2019-04-11 MED ORDER — THROMBIN 5000 UNITS EX SOLR
CUTANEOUS | Status: AC
  Filled 2019-04-11: qty 5000

## 2019-04-11 MED ORDER — FUROSEMIDE 10 MG/ML IJ SOLN
INTRAMUSCULAR | Status: AC
  Administered 2019-04-11: 80 mg via INTRAVENOUS
  Filled 2019-04-11: qty 8

## 2019-04-11 MED ORDER — DEXMEDETOMIDINE HCL IN NACL 200 MCG/50ML IV SOLN
INTRAVENOUS | Status: DC | PRN
  Administered 2019-04-11: 5 ug/kg/h via INTRAVENOUS

## 2019-04-11 MED ORDER — LIDOCAINE-EPINEPHRINE 2 %-1:100000 IJ SOLN
INTRAMUSCULAR | Status: DC | PRN
  Administered 2019-04-11: 8.5 mL via INTRADERMAL

## 2019-04-11 MED ORDER — 0.9 % SODIUM CHLORIDE (POUR BTL) OPTIME
TOPICAL | Status: DC | PRN
  Administered 2019-04-11: 1000 mL

## 2019-04-11 MED ORDER — WARFARIN - PHARMACIST DOSING INPATIENT
Freq: Every day | Status: DC
  Administered 2019-04-11 – 2019-04-12 (×2)

## 2019-04-11 MED ORDER — WARFARIN SODIUM 7.5 MG PO TABS
7.5000 mg | ORAL_TABLET | Freq: Once | ORAL | Status: AC
  Administered 2019-04-11: 7.5 mg via ORAL
  Filled 2019-04-11: qty 1

## 2019-04-11 MED ORDER — LACTATED RINGERS IV SOLN
INTRAVENOUS | Status: DC | PRN
  Administered 2019-04-11: 08:00:00 via INTRAVENOUS

## 2019-04-11 MED ORDER — THROMBIN 5000 UNITS EX SOLR
CUTANEOUS | Status: DC | PRN
  Administered 2019-04-11: 5000 [IU] via TOPICAL

## 2019-04-11 MED ORDER — AMINOCAPROIC ACID SOLUTION 5% (50 MG/ML)
10.0000 mL | ORAL | Status: DC
  Filled 2019-04-11: qty 100

## 2019-04-11 MED ORDER — FUROSEMIDE 10 MG/ML IJ SOLN
80.0000 mg | Freq: Two times a day (BID) | INTRAMUSCULAR | Status: DC
  Filled 2019-04-11: qty 8

## 2019-04-11 MED ORDER — HEMOSTATIC AGENTS (NO CHARGE) OPTIME
TOPICAL | Status: DC | PRN
  Administered 2019-04-11: 1 via TOPICAL

## 2019-04-11 MED ORDER — OXYCODONE HCL 5 MG/5ML PO SOLN: 5.0000 mg | Freq: Once | ORAL | Status: DC | PRN

## 2019-04-11 MED ORDER — OXYMETAZOLINE HCL 0.05 % NA SOLN
NASAL | Status: AC
  Filled 2019-04-11: qty 30

## 2019-04-11 MED ORDER — FENTANYL CITRATE (PF) 100 MCG/2ML IJ SOLN
INTRAMUSCULAR | Status: AC
  Administered 2019-04-11: 25 ug via INTRAVENOUS
  Filled 2019-04-11: qty 2

## 2019-04-11 SURGICAL SUPPLY — 44 items
ALCOHOL 70% 16 OZ (MISCELLANEOUS) ×3 IMPLANT
ATTRACTOMAT 16X20 MAGNETIC DRP (DRAPES) ×3 IMPLANT
BANDAGE HEMOSTAT MRDH 4X4 STRL (MISCELLANEOUS) IMPLANT
BLADE SURG 15 STRL LF DISP TIS (BLADE) ×2 IMPLANT
BLADE SURG 15 STRL SS (BLADE) ×6
BNDG HEMOSTAT MRDH 4X4 STRL (MISCELLANEOUS)
COVER SURGICAL LIGHT HANDLE (MISCELLANEOUS) ×3 IMPLANT
COVER WAND RF STERILE (DRAPES) ×1 IMPLANT
GAUZE 4X4 16PLY RFD (DISPOSABLE) ×1 IMPLANT
GAUZE PACKING FOLDED 2  STR (GAUZE/BANDAGES/DRESSINGS) ×2
GAUZE PACKING FOLDED 2 STR (GAUZE/BANDAGES/DRESSINGS) ×1 IMPLANT
GAUZE SPONGE 4X4 12PLY STRL LF (GAUZE/BANDAGES/DRESSINGS) ×2 IMPLANT
GLOVE BIO SURGEON STRL SZ 6.5 (GLOVE) ×2 IMPLANT
GLOVE BIO SURGEONS STRL SZ 6.5 (GLOVE) ×1
GLOVE SURG ORTHO 8.0 STRL STRW (GLOVE) ×3 IMPLANT
GOWN STRL REUS W/ TWL LRG LVL3 (GOWN DISPOSABLE) ×1 IMPLANT
GOWN STRL REUS W/TWL 2XL LVL3 (GOWN DISPOSABLE) ×3 IMPLANT
GOWN STRL REUS W/TWL LRG LVL3 (GOWN DISPOSABLE) ×3
HEMOSTAT SURGICEL 2X14 (HEMOSTASIS) IMPLANT
KIT BASIN OR (CUSTOM PROCEDURE TRAY) ×3 IMPLANT
KIT TURNOVER KIT B (KITS) ×3 IMPLANT
MANIFOLD NEPTUNE II (INSTRUMENTS) ×3 IMPLANT
NDL BLUNT 16X1.5 OR ONLY (NEEDLE) IMPLANT
NDL DENTAL 27 LONG (NEEDLE) IMPLANT
NEEDLE BLUNT 16X1.5 OR ONLY (NEEDLE) ×3 IMPLANT
NEEDLE DENTAL 27 LONG (NEEDLE) ×6 IMPLANT
NS IRRIG 1000ML POUR BTL (IV SOLUTION) ×3 IMPLANT
PACK EENT II TURBAN DRAPE (CUSTOM PROCEDURE TRAY) ×3 IMPLANT
PAD ARMBOARD 7.5X6 YLW CONV (MISCELLANEOUS) ×1 IMPLANT
SPONGE SURGIFOAM ABS GEL 100 (HEMOSTASIS) ×2 IMPLANT
SPONGE SURGIFOAM ABS GEL 12-7 (HEMOSTASIS) IMPLANT
SPONGE SURGIFOAM ABS GEL SZ50 (HEMOSTASIS) IMPLANT
SUCTION FRAZIER HANDLE 10FR (MISCELLANEOUS) ×2
SUCTION TUBE FRAZIER 10FR DISP (MISCELLANEOUS) ×1 IMPLANT
SUT CHROMIC 3 0 PS 2 (SUTURE) ×6 IMPLANT
SUT CHROMIC 4 0 P 3 18 (SUTURE) IMPLANT
SUT SILK 4 0 TIE 10X30 (SUTURE) ×2 IMPLANT
SYR 50ML SLIP (SYRINGE) ×3 IMPLANT
TOWEL GREEN STERILE (TOWEL DISPOSABLE) ×3 IMPLANT
TUBE CONNECTING 12'X1/4 (SUCTIONS) ×1
TUBE CONNECTING 12X1/4 (SUCTIONS) ×2 IMPLANT
WATER STERILE IRR 1000ML POUR (IV SOLUTION) ×3 IMPLANT
WATER TABLETS ICX (MISCELLANEOUS) ×3 IMPLANT
YANKAUER SUCT BULB TIP NO VENT (SUCTIONS) ×3 IMPLANT

## 2019-04-11 NOTE — Op Note (Signed)
OPERATIVE REPORT  Patient:            Mark [name] Date of Birth:  02-27-1976 MRN:                [id number]   DATE OF PROCEDURE:  04/11/2019  PREOPERATIVE DIAGNOSES: 1.  Acute on chronic congestive heart failure 2.  History of acute pulpitis 3.  Chronic apical periodontitis 4.  Dental caries 5.  Retained root segment 6.  Chronic periodontitis 7.  Loose teeth  POSTOPERATIVE DIAGNOSES: 1.  Acute on chronic congestive heart failure 2.  History of acute pulpitis 3.  Chronic apical periodontitis 4.  Dental caries 5.  Retained root segment 6.  Chronic periodontitis 7.  Loose teeth   OPERATIONS: 1. Multiple extraction of tooth numbers 2, 4, 5, 10, and 12. 2. 2 Quadrants of alveoloplasty 3.    SURGEON: Lenn Cal, DDS  ASSISTANT: Molli Posey (dental assistant)  ANESTHESIA: Monitored anesthesia care per anesthesiology   MEDICATIONS: 1. Local anesthesia with a total utilization of 5 carpules each containing 34 mg of lidocaine with 0.017 mg of epinephrine   SPECIMENS: There are 5 teeth that were discarded.  DRAINS: None  CULTURES: None  COMPLICATIONS: None  ESTIMATED BLOOD LOSS: Less than 25 mLs.  INTRAVENOUS FLUIDS: 200 mLs of Lactated ringers solution.  INDICATIONS: The patient was previously diagnosed with acute on chronic congestive heart failure.  A dental consultation was requested for evaluation and treatment of a history of acute pulpitis and multiple dental problems.  Patient was examined and treatment plan for multiple dental extractions with alveoloplasty.  Patient was subsequently referred to Dr. Glori Bickers for cardiac clearance. Patient was admitted after right heart catheterization with plan for the multiple dental extractions in the operating room.  This treatment plan was formulated to decrease the risks and complications associated with dental infection from affecting the patient's systemic health.  OPERATIVE FINDINGS: Patient  was examined operating room number 5.  The teeth were identified for extraction.  Tooth numbers 2, 4, 5, 10, and 12 were identified for extraction.  No additional teeth were identified for extraction at this time.  The patient was noted be affected by history of acute pulpitis, chronic apical periodontitis, dental caries, retained root segment, chronic periodontitis, and loose teeth.   DESCRIPTION OF PROCEDURE: Patient was brought to the main operating room number 5. Patient was then placed in the supine position on the operating table.  Monitored anesthesia care was then induced per the anesthesia team. The patient was then prepped and draped in the usual manner for dental medicine procedure. A timeout was performed. The patient was identified and procedures were verified. The oral cavity was then thoroughly examined with the findings noted above. The patient was then ready for dental medicine procedure as follows:  Local anesthesia was then administered sequentially with a total utilization of 5 carpules each containing 34 mg of lidocaine with 0.017 mg of epinephrine.  The Maxillary left and right quadrants first approached. Anesthesia was then delivered utilizing infiltration with lidocaine with epinephrine.  Tooth numbers 10 and 12 were then approached.  A #15 blade incision was then made around tooth numbers 10 and 12 in a sulcular fashion on the buccal and palatal aspects.  A  surgical flap was then carefully reflected.  Tooth numbers 10 and 12 were then subluxated with a series of straight elevators.  Tooth numbers 10 and 12 were then removed with a 150 forceps without complications.  The sockets  were curetted and compressed appropriately.  The surgical sites were then irrigated with copious amounts sterile saline.  A piece of Surgifoam impregnated with topical thrombin were then placed in each extraction socket appropriately.  The surgical site area #12 was then closed from the distal of #12 and  extended the mesial #12 utilizing 3-0 Chromic Gut suture in a continuous erupted suture technique x1.  The surgical site #10 was then closed from the mesial of #11 and extended to the distal of #9 utilizing 3-0 Chromic Gut suture in a continuous erupted suture technique x1.    At this point time, the maxillary right quadrant was approached.  A Woodson elevator was used to remove the soft tissue from the hard tissue in the area of tooth numbers 2, 4, and 5.  Tooth numbers 2, 4, and 5 were then subluxated with a series of straight elevators.  A 15 blade incision was then made from the distal of #2 and extended to the distal of #3 and again from the mesial of #3 to the distal of #6.  Surgical flaps were then carefully reflected on the buccal and palatal aspects.  A surgical handpiece and bur and copious amounts of sterile water were used to remove buccal and interseptal bone around tooth numbers 2, 4, and 5.  These teeth were then again subluxated with a series straight elevators.  Tooth #2 was then removed with a 53R forceps without complications.  Tooth numbers 4 and 5 were then removed with a 150 forceps without complications.  The sockets were curetted and compressed appropriately.  Alveoloplasty was then performed utilizing rongeurs and bone file help achieve primary closure.  The surgical sites were then irrigated with copious amounts of sterile saline.  A piece of Surgifoam impregnated with topical thrombin were placed in each extraction socket appropriately in the area of tooth numbers 2, 4, and 5.  The surgical site area #2 was then closed from the distal of #2 and extended to the distal of #3 utilizing 3-0 Chromic Gut suture in a continuous erupted suture technique x1.  The surgical site in the area of tooth #4- 5 were then closed from the mesial #3 and extended to the distal #6 utilizing 3-0 Chromic Gut suture in a continuous rapid suture technique x1.   Please note: A series of 4 x 4 gauzes were placed  in the mouth prior to dental extraction procedures to prevent aspiration of tooth fragments and copious amounts of sterile saline.  These were then removed at the end of the dental medicine procedure.  At this point time, the entire mouth was examined for complications, seeing none, the dental medicine procedure was deemed to be complete. A series of 4 x 4 gauze moistened with Amicar 5% rinse were placed in the mouth to aid hemostasis. The patient was then handed over to the anesthesia team for final disposition. After an appropriate amount of time, the patient was taken to the postanesthsia care unit in good condition. All counts were correct for the dental medicine procedure.  The patient is to continue the Amicar 5% rinses postoperatively.  Patient is to rinse with 10 mls every hour for the next 10 hours in a swish and spit manner.  The Coumadin therapy is to be restarted this evening by the pharmacy team if there is no significant oral bleeding.  Heparin therapy has been discontinued at this time per discussion with Dr. Arvilla Meres.   Charlynne Pander, DDS.

## 2019-04-11 NOTE — Progress Notes (Signed)
[place] for Heparin> warfarin Indication: hx apical thrombus  Allergies  Allergen Reactions  . Clindamycin/Lincomycin Swelling and Palpitations  . Hydrocodone Hives  . Lisinopril Swelling and Other (See Comments)    Facial swelling/angioedema  . Prednisone Shortness Of Breath, Nausea Only, Swelling and Other (See Comments)    Also made chest feel tight and genital area, legs, and face became swollen badly  . Bactrim [Sulfamethoxazole-Trimethoprim] Other (See Comments)    unknown  . Penicillins Hives and Swelling     Has patient had a PCN reaction causing immediate rash, facial/tongue/throat swelling, SOB or lightheadedness with hypotension: Yes Has patient had a PCN reaction causing severe rash involving mucus membranes or skin necrosis: No Has patient had a PCN reaction that required hospitalization: No Has patient had a PCN reaction occurring within the last 10 years: No If all of the above answers are "NO", then may proceed with Cephalosporin use.     Patient Measurements: Height: 5\' 8"  (172.7 cm) Weight: 148 lb 2.4 oz (67.2 kg) IBW/kg (Calculated) : 68.4 Heparin Dosing Weight: 62.1 kg  Vital Signs: Temp: 97.9 F (36.6 C) (08/20 1300) BP: 133/95 (08/20 1300) Pulse Rate: 53 (08/20 1011)  Labs: Recent Labs    04/09/19 1051  04/09/19 1444 04/09/19 2214 04/10/19 0717 04/11/19 0207  HGB 11.9*   < > 12.9*  --  11.4* 11.6*  HCT 37.4*   < > 38.0*  --  35.8* 36.8*  PLT 185  --   --   --  150 155  LABPROT 17.9*  --   --   --  20.1* 17.7*  INR 1.5*  --   --   --  1.7* 1.5*  HEPARINUNFRC  --   --   --  0.20* 0.35 0.33  CREATININE 1.16  --   --   --  1.00 0.88   < > = values in this interval not displayed.    Estimated Creatinine Clearance: 100.8 mL/min (by C-G formula based on SCr of 0.88 mg/dL).   Medical History: Past Medical History:  Diagnosis Date  . Asthma   . Chronic systolic CHF (congestive heart failure) ([place])   .  Cigarette smoker   . CKD (chronic kidney disease), stage II    Archie Endo 10/01/2017  . COPD (chronic obstructive pulmonary disease) ([place]) 10/21/2017   on CT scan chest  . History of echocardiogram    a. Echo 5/17 - EF 20-25%, severe diff HK, restrictive physiology, mild to mod MR, severe reduced RVSF, mod RVE, mild RAE, mod TR, PASP 48 mmHg  . Hx of cardiac cath    a. LHC 5/17 - normal coronary arteries. PA 45/25, mean 33, PCWP mean 18  . NICM (nonischemic cardiomyopathy) ([place])   . Stroke ([place]) 09/27/2017   "was weak on my left side; I'm fully recovered" (11/09/2017)  . Substance abuse ([facility])    cocaine, marijuana    Medications:  Scheduled:  . aspirin EC  81 mg Oral Daily  . Chlorhexidine Gluconate Cloth  6 each Topical Daily  . digoxin  0.125 mg Oral Daily  . feeding supplement (ENSURE ENLIVE)  237 mL Oral BID BM  . furosemide  80 mg Intravenous BID  . mometasone-formoterol  2 puff Inhalation BID  . multivitamin with minerals  1 tablet Oral Daily  . pneumococcal 23 valent vaccine  0.5 mL Intramuscular Tomorrow-1000  . sodium chloride flush  3 mL Intravenous Q12H  . sodium chloride flush  3  mL Intravenous Q12H  . spironolactone  25 mg Oral Daily  . warfarin  7.5 mg Oral ONCE-1800  . Warfarin - Pharmacist Dosing Inpatient   Does not apply q1800    Assessment: 43 yom admitted with mixed LV/RV dysfunction - on warfarin PTA for hx of apical thrombus. Last dose on 8/17. Plan for dental tooth extraction per Dr Kristin Bruins on 8/20.  PTA regimen reported is 2.5 mg daily except 5 mg on Sunday (per last anti-coag appt regimen is 5 mg daily except 2.5 mg on Tuesday).   INR today 1.5 is subtherapeutic. Discussion between DDS and HF MD  - no heparin drip for now - will just restart warfarin  - if still here over the weekend and no bleeding and CBC stable and INR < 2- will reassess and re-discuss heparin   Goal of Therapy:  INR 2-3 Heparin level 0.3-0.7 units/ml Monitor platelets by  anticoagulation protocol: Yes   Plan:  Warfarin 7.5mg  x1 today Daily CBC, Protime  Leota Sauers Pharm.D. CPP, BCPS Clinical Pharmacist [phone] 04/11/2019 1:47 PM

## 2019-04-11 NOTE — Progress Notes (Signed)
Spoke with PA Dunn via phone in regard to patient's complaints of upper right arm pain.  Area above catheter insertion site is very tender to touch.  Informed that Dr. Sonia Side would be over to evaluate the site.

## 2019-04-11 NOTE — Progress Notes (Signed)
Brief Nutrition Note  RD consulted for assessment s/p multiple upper extractions.  Please see RD initial assessment dated 04/10/19.  Discussed pt with RN who reports pt has already consumed some more solid-type food since his surgery this AM. Spoke with pt at bedside who was very lethargic. Pt states he is willing to continue to drink Ensure Enlive which RD ordered yesterday. Noted pt consumed jello from lunch meal tray.  At this time, will continue with current nutrition interventions already in place. RD will continue to follow pt.   Gaynell Face, MS, RD, LDN Inpatient Clinical Dietitian Pager: [phone] Weekend/After Hours: [phone]

## 2019-04-11 NOTE — Transfer of Care (Signed)
Immediate Anesthesia Transfer of Care Note  Patient: Mark [name]  Procedure(s) Performed: Extraction of tooth #'s 2, 4, 5, 10, and 12 with alveoloplasty (Bilateral Mouth)  Patient Location: PACU  Anesthesia Type:MAC  Level of Consciousness: drowsy  Airway & Oxygen Therapy: Patient Spontanous Breathing and Patient connected to nasal cannula oxygen  Post-op Assessment: Report given to RN and Post -op Vital signs reviewed and stable  Post vital signs: Reviewed and stable  Last Vitals:  Vitals Value Taken Time  BP 123/84 04/11/19 0910  Temp    Pulse 110 04/11/19 0913  Resp 27 04/11/19 0913  SpO2 100 % 04/11/19 0913  Vitals shown include unvalidated device data.  Last Pain:  Vitals:   04/10/19 1300  TempSrc: Core  PainSc:          Complications: No apparent anesthesia complications

## 2019-04-11 NOTE — Progress Notes (Addendum)
PRE-OPERATIVE NOTE:  04/11/2019 Mark [name] [id number]  VITALS: BP (!) 111/93   Pulse 100   Temp 98.2 F (36.8 C)   Resp 19   Ht 5\' 8"  (1.727 m)   Wt 67.2 kg   SpO2 100%   BMI 22.53 kg/m   Lab Results  Component Value Date   WBC 4.9 04/11/2019   HGB 11.6 (L) 04/11/2019   HCT 36.8 (L) 04/11/2019   MCV 81.2 04/11/2019   PLT 155 04/11/2019   BMET    Component Value Date/Time   NA 134 (L) 04/11/2019 0207   NA 143 12/01/2017 1000   K 4.6 04/11/2019 0207   CL 97 (L) 04/11/2019 0207   CO2 28 04/11/2019 0207   GLUCOSE 97 04/11/2019 0207   BUN 17 04/11/2019 0207   BUN 18 12/01/2017 1000   CREATININE 0.88 04/11/2019 0207   CALCIUM 9.1 04/11/2019 0207   GFRNONAA >60 04/11/2019 0207   GFRAA >60 04/11/2019 0207    Lab Results  Component Value Date   INR 1.5 (H) 04/11/2019   INR 1.7 (H) 04/10/2019   INR 1.5 (H) 04/09/2019   No results found for: PTT   Mark [name] presents for multiple dental extractions with alveoloplasty in the operating room with general anesthesia.  After discussion with anesthesiologist this morning, it was determined that patient would start off with IV sedation and be converted to use of general anesthesia if needed.  This decision was made due to concerns of Dr. Clayborne Dana note of 04/10/19.    SUBJECTIVE: The patient denies any acute medical or dental changes and agrees to proceed with treatment as planned.  EXAM: No sign of acute dental changes.  ASSESSMENT: Patient is affected by history of acute pulpitis, chronic apical periodontitis, dental caries, retained root segment, chronic periodontitis, and loose teeth.  PLAN: Patient agrees to proceed with treatment as planned in the operating room as previously discussed and accepts the risks, benefits, and complications of the proposed treatment. Patient is aware of the risk for bleeding, bruising, swelling, infection, pain, nerve damage, soft tissue damage, sinus involvement, root tip  fracture, and the risks of complications associated with the anesthesia. Patient also is aware of the potential for other complications up to and including death due to his overall cardiovascular and respiratory compromise.    Lenn Cal, DDS

## 2019-04-11 NOTE — Anesthesia Procedure Notes (Signed)
Arterial Line Insertion Start/End8/20/2020 7:10 AM, 04/11/2019 7:40 AM Performed by: Lidia Collum, MD, anesthesiologist  Patient location: Pre-op. Preanesthetic checklist: patient identified, IV checked, site marked, risks and benefits discussed, surgical consent, monitors and equipment checked, pre-op evaluation, timeout performed and anesthesia consent Lidocaine 1% used for infiltration Left, radial was placed Catheter size: 20 G Hand hygiene performed , maximum sterile barriers used  and Seldinger technique used Allen's test indicative of satisfactory collateral circulation Attempts: 4 Procedure performed using ultrasound guided technique. Following insertion, dressing applied. Post procedure assessment: normal and unchanged  Patient tolerated the procedure well with no immediate complications.

## 2019-04-11 NOTE — Discharge Instructions (Signed)
,MOUTH CARE AFTER SURGERY  FACTS:  Ice used in ice bag helps keep the swelling down, and can help lessen the pain.  It is easier to treat pain BEFORE it happens.  Spitting disturbs the clot and may cause bleeding to start again, or to get worse.  Smoking delays healing and can cause complications.  Sharing prescriptions can be dangerous.  Do not take medications not recently prescribed for you.  Antibiotics may stop birth control pills from working.  Use other means of birth control while on antibiotics.  Warm salt water rinses after the first 24 hours will help lessen the swelling:  Use 1/2 teaspoonful of table salt per oz.of water.  DO NOT:  Do not spit.  Do not drink through a straw.  Strongly advised not to smoke, dip snuff or chew tobacco at least for 3 days.  Do not eat sharp or crunchy foods.  Avoid the area of surgery when chewing.  Do not stop your antibiotics before your instructions say to do so.  Do not eat hot foods until bleeding has stopped.  If you need to, let your food cool down to room temperature.  EXPECT:  Some swelling, especially first 2-3 days.  Soreness or discomfort in varying degrees.  Follow your dentist's instructions about how to handle pain before it starts.  Pinkish saliva or light blood in saliva, or on your pillow in the morning.  This can last around 24 hours.  Bruising inside or outside the mouth.  This may not show up until 2-3 days after surgery.  Don't worry, it will go away in time.  Pieces of "bone" may work themselves loose.  It's OK.  If they bother you, let us know.  WHAT TO DO IMMEDIATELY AFTER SURGERY:  Bite on the gauze with steady pressure for 1-2 hours.  Don't chew on the gauze.  Do not lie down flat.  Raise your head support especially for the first 24 hours.  Apply ice to your face on the side of the surgery.  You may apply it 20 minutes on and a few minutes off.  Ice for 8-12 hours.  You may use ice up to 24  hours.  Before the numbness wears off, take a pain pill as instructed.  Prescription pain medication is not always required.  SWELLING:  Expect swelling for the first couple of days.  It should get better after that.  If swelling increases 3 days or so after surgery; let us know as soon as possible.  FEVER:  Take Tylenol every 4 hours if needed to lower your temperature, especially if it is at 100F or higher.  Drink lots of fluids.  If the fever does not go away, let us know.  BREATHING TROUBLE:  Any unusual difficulty breathing means you have to have someone bring you to the emergency room ASAP  BLEEDING:  Light oozing is expected for 24 hours or so.  Prop head up with pillows  Avoid spitting  Do not confuse bright red fresh flowing blood with lots of saliva colored with a little bit of blood.  If you notice some bleeding, place gauze or a tea bag where it is bleeding and apply CONSTANT pressure by biting down for 1 hour.  Avoid talking during this time.  Do not remove the gauze or tea bag during this hour to "check" the bleeding.  If you notice bright RED bleeding FLOWING out of particular area, and filling the floor of your mouth, put  a wad of gauze on that area, bite down firmly and constantly.  Call us immediately.  If we're closed, have someone bring you to the emergency room. ° °ORAL HYGIENE: °· Brush your teeth as usual after meals and before bedtime. °· Use a soft toothbrush around the area of surgery. °· DO NOT AVOID BRUSHING.  Otherwise bacteria(germs) will grow and may delay healing or encourage infection. °· Since you cannot spit, just gently rinse and let the water flow out of your mouth. °· DO NOT SWISH HARD. ° °EATING: °· Cool liquids are a good point to start.  Increase to soft foods as tolerated. ° °PRESCRIPTIONS: °· Follow the directions for your prescriptions exactly as written. °· If Dr. Hyun Reali gave you a narcotic pain medication, do not drive, operate  machinery or drink alcohol when on that medication. ° °QUESTIONS: °· Call our office during office hours [phone] or call the Emergency Room at [phone]. ° °

## 2019-04-11 NOTE — Anesthesia Postprocedure Evaluation (Signed)
Anesthesia Post Note  Patient: ASAIAH SCARBER  Procedure(s) Performed: Extraction of tooth #'s 2, 4, 5, 10, and 12 with alveoloplasty (Bilateral Mouth)     Patient location during evaluation: PACU Anesthesia Type: MAC Level of consciousness: awake and alert Pain management: pain level controlled Vital Signs Assessment: post-procedure vital signs reviewed and stable Respiratory status: spontaneous breathing, nonlabored ventilation, respiratory function stable and patient connected to nasal cannula oxygen Cardiovascular status: blood pressure returned to baseline and stable Postop Assessment: no apparent nausea or vomiting Anesthetic complications: no    Last Vitals:  Vitals:   04/11/19 1015 04/11/19 1100  BP:  (!) 151/133  Pulse:    Resp:  (!) 23  Temp: 36.4 C 36.9 C  SpO2:  95%    Last Pain:  Vitals:   04/11/19 0954  TempSrc:   PainSc: Asleep                 Lidia Collum

## 2019-04-11 NOTE — Progress Notes (Addendum)
Advanced Heart Failure Rounding Note  PCP-Cardiologist: Verne Carrow, MD   Subjective:    Had oral surgery earlier today. CO-OX 47%.   Complaining of mouth pain.   CVP 9 .  CO 4.0   RHC 04/09/19  RA = 16 RV = 51/8 PA =  49/28 (38) PCW = 30 Fick cardiac output/index = 2.6/1.5 Thermo CO/CI = 3.42/1.9 PVR = 3.1 WU FA sat = 99% PA sat = 44%, 45% PaPI = 1.3 RA/PCWP = 0.53 Assessment: 1. Severely decompensated HF with biventricular failure and elevated filling pressures  Objective:   Weight Range: 67.2 kg Body mass index is 22.53 kg/m.   Vital Signs:   Temp:  [97.6 F (36.4 C)-99.1 F (37.3 C)] 98.4 F (36.9 C) (08/20 1100) Pulse Rate:  [53-110] 53 (08/20 1011) Resp:  [15-29] 23 (08/20 1100) BP: (98-151)/(66-133) 151/133 (08/20 1100) SpO2:  [91 %-100 %] 95 % (08/20 1100) Arterial Line BP: (113-120)/(80-84) 120/80 (08/20 1100) Weight:  [67.2 kg] 67.2 kg (08/20 0500)    Weight change: Filed Weights   04/09/19 1529 04/10/19 0448 04/11/19 0500  Weight: 69.1 kg 63.6 kg 67.2 kg    Intake/Output:   Intake/Output Summary (Last 24 hours) at 04/11/2019 1203 Last data filed at 04/11/2019 1100 Gross per 24 hour  Intake 1000.35 ml  Output 2975 ml  Net -1974.65 ml      Physical Exam   CVP 9  General:   No resp difficulty HEENT: normal Neck: supple. JVP to jaw. no JVD. Carotids 2+ bilat; no bruits. No lymphadenopathy or thryomegaly appreciated. Cor: PMI nondisplaced. Regular rate & rhythm. No rubs, or murmurs. +S3  Lungs: clear Abdomen: soft, nontender, nondistended. No hepatosplenomegaly. No bruits or masses. Good bowel sounds. Extremities: no cyanosis, clubbing, rash, edema Neuro: alert & orientedx3, cranial nerves grossly intact. moves all 4 extremities w/o difficulty. Affect pleasant   Telemetry  ST   EKG    N/a   Labs    CBC Recent Labs    04/10/19 0717 04/11/19 0207  WBC 5.8 4.9  HGB 11.4* 11.6*  HCT 35.8* 36.8*  MCV 81.7  81.2  PLT 150 155   Basic Metabolic Panel Recent Labs    91/91/66 0717 04/11/19 0207  NA 135 134*  K 3.9 4.6  CL 98 97*  CO2 28 28  GLUCOSE 115* 97  BUN 21* 17  CREATININE 1.00 0.88  CALCIUM 9.2 9.1  MG 1.6*  --    Liver Function Tests No results for input(s): AST, ALT, ALKPHOS, BILITOT, PROT, ALBUMIN in the last 72 hours. No results for input(s): LIPASE, AMYLASE in the last 72 hours. Cardiac Enzymes No results for input(s): CKTOTAL, CKMB, CKMBINDEX, TROPONINI in the last 72 hours.  BNP: BNP (last 3 results) Recent Labs    12/28/18 0251 01/24/19 1955 02/13/19 2243  BNP 848.5* 2,792.5* 3,902.9*    ProBNP (last 3 results) No results for input(s): PROBNP in the last 8760 hours.   D-Dimer No results for input(s): DDIMER in the last 72 hours. Hemoglobin A1C No results for input(s): HGBA1C in the last 72 hours. Fasting Lipid Panel No results for input(s): CHOL, HDL, LDLCALC, TRIG, CHOLHDL, LDLDIRECT in the last 72 hours. Thyroid Function Tests No results for input(s): TSH, T4TOTAL, T3FREE, THYROIDAB in the last 72 hours.  Invalid input(s): FREET3  Other results:   Imaging    No results found.   Medications:     Scheduled Medications: . aspirin EC  81 mg Oral Daily  .  Chlorhexidine Gluconate Cloth  6 each Topical Daily  . digoxin  0.125 mg Oral Daily  . feeding supplement (ENSURE ENLIVE)  237 mL Oral BID BM  . furosemide  80 mg Intravenous BID  . mometasone-formoterol  2 puff Inhalation BID  . multivitamin with minerals  1 tablet Oral Daily  . pneumococcal 23 valent vaccine  0.5 mL Intramuscular Tomorrow-1000  . sodium chloride flush  3 mL Intravenous Q12H  . sodium chloride flush  3 mL Intravenous Q12H  . spironolactone  25 mg Oral Daily    Infusions: . sodium chloride    . sodium chloride 10 mL/hr at 04/11/19 0500  . sodium chloride    . sodium chloride    . lactated ringers 10 mL/hr at 04/11/19 1100  . milrinone 0.25 mcg/kg/min (04/11/19  0750)    PRN Medications: sodium chloride, sodium chloride, sodium chloride, acetaminophen, albuterol, ondansetron (ZOFRAN) IV, sodium chloride flush, sodium chloride flush, sodium chloride flush     Assessment/Plan  1. Acute/Chronic Biventricular Heart Failure - ECHO 12/2018 EF 10-15% Severely reduced RV.  - NYHA IIIb. - Had cath with low output physiology and marked volume overload. CO-OX 47%. Milrinone increased to 0.375 mcg. Will eventually wean milrinone.  CVP was high after surgery. IV lasix given and CVP trending down.  - No bb with low output and recent cocaine abuse.  - Continue digoxin 0.125 mg daily.  - Continue spironolactone to 25 mg daily.   - No Arb with hypotension.   - No entresto with h/o angioedema.  - I dont think he is a candidate for given ongoing drug use.   2. Apical Thrombus - Identified on ECHO 12/2018. At that time he refused to take coumadin. - Starting coumadin today. .   3. H/O MCA CVA 2019  - Continue atorvastatin 40 mg daily. Continue aspirin 81 mg daily.   4. Cocaine Abuse - UDS + in June   5. Social  - He can't pay for medications and does not have transportation to appointments. HFSW following. Continue HF Paramedicine  6. Tobacco Abuse - Discussed smoking cessation.   7. Periodontitis - S/P dental surgery with 5 teeth extracted  Restarting coumadin tonight.   Length of Stay: 2  Tonye BecketAmy Clegg, NP  04/11/2019, 12:03 PM  Advanced Heart Failure Team Pager [phone] (M-F; 7a - 4p)  Please contact [facility] for night-coverage after hours (4p -7a ) and weekends on [email or web address]  Patient seen after arriving back to CCU from OR. On arrival, somewhat lethargic. Mouth sore.   CVP ~20 with co-ox 47% despite milrinone at 0.25 suggestive of recurrent cardiogenic shock.   Milrinone increased to 0.375 and given IV lasix. Fentanyl for pain.   Good diuresis with lasix. CVP dropped to 9. I performed hemodynamics myself and CO up to 4.0  L/min with CI 2.3.   On exam lethargic but able to communicate Mouth swollen JVP to jaw Cor RRR + s3 Lungs course Ab mildly distended Ext warm no edema  Returned from OR with recurrent shock. Now improved with increase milrinone and IV diuresis. Continue to monitor closely in CCU.   CRITICAL CARE Performed by: Arvilla MeresBensimhon, Ayat Drenning  Total critical care time: 45 minutes  Critical care time was exclusive of separately billable procedures and treating other patients.  Critical care was necessary to treat or prevent imminent or life-threatening deterioration.  Critical care was time spent personally by me (independent of midlevel providers or residents) on the following activities: development of treatment plan  with patient and/or surrogate as well as nursing, discussions with consultants, evaluation of patient's response to treatment, examination of patient, obtaining history from patient or surrogate, ordering and performing treatments and interventions, ordering and review of laboratory studies, ordering and review of radiographic studies, pulse oximetry and re-evaluation of patient's condition.  Glori Bickers, MD  9:42 PM

## 2019-04-12 ENCOUNTER — Encounter (HOSPITAL_COMMUNITY): Payer: Self-pay | Admitting: Dentistry

## 2019-04-12 ENCOUNTER — Inpatient Hospital Stay (HOSPITAL_COMMUNITY): Payer: Medicaid Other

## 2019-04-12 LAB — CBC WITH DIFFERENTIAL/PLATELET
Abs Immature Granulocytes: 0.01 10*3/uL (ref 0.00–0.07)
Basophils Absolute: 0 10*3/uL (ref 0.0–0.1)
Basophils Relative: 0 %
Eosinophils Absolute: 0.1 10*3/uL (ref 0.0–0.5)
Eosinophils Relative: 1 %
HCT: 35.7 % — ABNORMAL LOW (ref 39.0–52.0)
Hemoglobin: 11.4 g/dL — ABNORMAL LOW (ref 13.0–17.0)
Immature Granulocytes: 0 %
Lymphocytes Relative: 22 %
Lymphs Abs: 1.2 10*3/uL (ref 0.7–4.0)
MCH: 25.9 pg — ABNORMAL LOW (ref 26.0–34.0)
MCHC: 31.9 g/dL (ref 30.0–36.0)
MCV: 81 fL (ref 80.0–100.0)
Monocytes Absolute: 0.9 10*3/uL (ref 0.1–1.0)
Monocytes Relative: 17 %
Neutro Abs: 3.2 10*3/uL (ref 1.7–7.7)
Neutrophils Relative %: 60 %
Platelets: 129 10*3/uL — ABNORMAL LOW (ref 150–400)
RBC: 4.41 MIL/uL (ref 4.22–5.81)
RDW: 17.3 % — ABNORMAL HIGH (ref 11.5–15.5)
WBC: 5.4 10*3/uL (ref 4.0–10.5)
nRBC: 0 % (ref 0.0–0.2)

## 2019-04-12 LAB — LIPID PANEL
Cholesterol: 118 mg/dL (ref 0–200)
HDL: 40 mg/dL — ABNORMAL LOW (ref >40)
LDL Cholesterol: 69 mg/dL (ref 0–99)
Total CHOL/HDL Ratio: 3 RATIO
Triglycerides: 43 mg/dL (ref <150)
VLDL: 9 mg/dL (ref 0–40)

## 2019-04-12 LAB — TYPE AND SCREEN
ABO/RH(D): B POS
Antibody Screen: NEGATIVE

## 2019-04-12 LAB — CBC
HCT: 35 % — ABNORMAL LOW (ref 39.0–52.0)
Hemoglobin: 11 g/dL — ABNORMAL LOW (ref 13.0–17.0)
MCH: 26.3 pg (ref 26.0–34.0)
MCHC: 31.4 g/dL (ref 30.0–36.0)
MCV: 83.5 fL (ref 80.0–100.0)
Platelets: 111 10*3/uL — ABNORMAL LOW (ref 150–400)
RBC: 4.19 MIL/uL — ABNORMAL LOW (ref 4.22–5.81)
RDW: 17.9 % — ABNORMAL HIGH (ref 11.5–15.5)
WBC: 5.8 10*3/uL (ref 4.0–10.5)
nRBC: 0 % (ref 0.0–0.2)

## 2019-04-12 LAB — COMPREHENSIVE METABOLIC PANEL
ALT: 20 U/L (ref 0–44)
AST: 31 U/L (ref 15–41)
Albumin: 3.6 g/dL (ref 3.5–5.0)
Alkaline Phosphatase: 89 U/L (ref 38–126)
Anion gap: 10 (ref 5–15)
BUN: 28 mg/dL — ABNORMAL HIGH (ref 6–20)
CO2: 30 mmol/L (ref 22–32)
Calcium: 9.3 mg/dL (ref 8.9–10.3)
Chloride: 92 mmol/L — ABNORMAL LOW (ref 98–111)
Creatinine, Ser: 1.2 mg/dL (ref 0.61–1.24)
GFR calc Af Amer: >60 mL/min (ref >60)
GFR calc non Af Amer: >60 mL/min (ref >60)
Glucose, Bld: 121 mg/dL — ABNORMAL HIGH (ref 70–99)
Potassium: 4.7 mmol/L (ref 3.5–5.1)
Sodium: 132 mmol/L — ABNORMAL LOW (ref 135–145)
Total Bilirubin: 1.6 mg/dL — ABNORMAL HIGH (ref 0.3–1.2)
Total Protein: 7.3 g/dL (ref 6.5–8.1)

## 2019-04-12 LAB — MAGNESIUM: Magnesium: 1.8 mg/dL (ref 1.7–2.4)

## 2019-04-12 LAB — BASIC METABOLIC PANEL
Anion gap: 9 (ref 5–15)
Anion gap: 9 (ref 5–15)
BUN: 24 mg/dL — ABNORMAL HIGH (ref 6–20)
BUN: 23 mg/dL — ABNORMAL HIGH (ref 6–20)
CO2: 29 mmol/L (ref 22–32)
CO2: 27 mmol/L (ref 22–32)
Calcium: 9.4 mg/dL (ref 8.9–10.3)
Calcium: 9.2 mg/dL (ref 8.9–10.3)
Chloride: 94 mmol/L — ABNORMAL LOW (ref 98–111)
Chloride: 94 mmol/L — ABNORMAL LOW (ref 98–111)
Creatinine, Ser: 1.08 mg/dL (ref 0.61–1.24)
Creatinine, Ser: 1.02 mg/dL (ref 0.61–1.24)
GFR calc Af Amer: >60 mL/min (ref >60)
GFR calc Af Amer: >60 mL/min (ref >60)
GFR calc non Af Amer: >60 mL/min (ref >60)
GFR calc non Af Amer: >60 mL/min (ref >60)
Glucose, Bld: 104 mg/dL — ABNORMAL HIGH (ref 70–99)
Glucose, Bld: 114 mg/dL — ABNORMAL HIGH (ref 70–99)
Potassium: 5.4 mmol/L — ABNORMAL HIGH (ref 3.5–5.1)
Potassium: 5.1 mmol/L (ref 3.5–5.1)
Sodium: 132 mmol/L — ABNORMAL LOW (ref 135–145)
Sodium: 130 mmol/L — ABNORMAL LOW (ref 135–145)

## 2019-04-12 LAB — PROTIME-INR
INR: 1.2 (ref 0.8–1.2)
Prothrombin Time: 15.1 seconds (ref 11.4–15.2)

## 2019-04-12 LAB — TSH: TSH: 1.25 u[IU]/mL (ref 0.350–4.500)

## 2019-04-12 LAB — COOXEMETRY PANEL
Carboxyhemoglobin: 1.9 % — ABNORMAL HIGH (ref 0.5–1.5)
Methemoglobin: 1.1 % (ref 0.0–1.5)
O2 Saturation: 64.5 %
Total hemoglobin: 11.4 g/dL — ABNORMAL LOW (ref 12.0–16.0)

## 2019-04-12 LAB — PREALBUMIN: Prealbumin: 12.7 mg/dL — ABNORMAL LOW (ref 18–38)

## 2019-04-12 LAB — PSA: Prostatic Specific Antigen: 0.28 ng/mL (ref 0.00–4.00)

## 2019-04-12 LAB — T4, FREE: Free T4: 1.03 ng/dL (ref 0.61–1.12)

## 2019-04-12 LAB — APTT: aPTT: 30 seconds (ref 24–36)

## 2019-04-12 LAB — HEMOGLOBIN A1C
Hgb A1c MFr Bld: 6.4 % — ABNORMAL HIGH (ref 4.8–5.6)
Mean Plasma Glucose: 136.98 mg/dL

## 2019-04-12 LAB — ANTITHROMBIN III: AntiThromb III Func: 69 % — ABNORMAL LOW (ref 75–120)

## 2019-04-12 LAB — ABO/RH: ABO/RH(D): B POS

## 2019-04-12 LAB — LACTATE DEHYDROGENASE: LDH: 274 U/L — ABNORMAL HIGH (ref 98–192)

## 2019-04-12 LAB — URIC ACID: Uric Acid, Serum: 8.5 mg/dL (ref 3.7–8.6)

## 2019-04-12 MED ORDER — SPIRONOLACTONE 25 MG PO TABS
25.0000 mg | ORAL_TABLET | Freq: Every day | ORAL | Status: DC
  Filled 2019-04-12: qty 1

## 2019-04-12 MED ORDER — FUROSEMIDE 80 MG PO TABS
80.0000 mg | ORAL_TABLET | Freq: Every day | ORAL | Status: DC
  Administered 2019-04-12 – 2019-04-13 (×2): 80 mg via ORAL
  Filled 2019-04-12 (×2): qty 1

## 2019-04-12 MED ORDER — WARFARIN SODIUM 7.5 MG PO TABS
7.5000 mg | ORAL_TABLET | Freq: Once | ORAL | Status: AC
  Administered 2019-04-12: 7.5 mg via ORAL
  Filled 2019-04-12: qty 1

## 2019-04-12 NOTE — Progress Notes (Addendum)
Advanced Heart Failure Rounding Note  PCP-Cardiologist: Verne Carrow, MD   Subjective:    Frustrated wants to go home. Having arm pain around swan. Wants it out. Able to eat breakfast. Denies SOB.   RHC 04/09/19  RA = 16 RV = 51/8 PA =  49/28 (38) PCW = 30 Fick cardiac output/index = 2.6/1.5 Thermo CO/CI = 3.42/1.9 PVR = 3.1 WU FA sat = 99% PA sat = 44%, 45% PaPI = 1.3 RA/PCWP = 0.53 Assessment: 1. Severely decompensated HF with biventricular failure and elevated filling pressures  Objective:   Weight Range: 63.7 kg Body mass index is 21.35 kg/m.   Vital Signs:   Temp:  [97.6 F (36.4 C)-99 F (37.2 C)] 99 F (37.2 C) (08/21 0545) Pulse Rate:  [53-110] 53 (08/20 1011) Resp:  [13-33] 21 (08/21 0545) BP: (104-154)/(60-133) 121/90 (08/21 0500) SpO2:  [93 %-100 %] 95 % (08/21 0545) Arterial Line BP: (110-134)/(74-84) 120/83 (08/20 1600) Weight:  [63.7 kg] 63.7 kg (08/21 0500)    Weight change: Filed Weights   04/10/19 0448 04/11/19 0500 04/12/19 0500  Weight: 63.6 kg 67.2 kg 63.7 kg    Intake/Output:   Intake/Output Summary (Last 24 hours) at 04/12/2019 0725 Last data filed at 04/12/2019 0600 Gross per 24 hour  Intake 708.11 ml  Output 2275 ml  Net -1566.89 ml      Physical Exam   General: No resp difficulty HEENT: normal Neck: supple. 7-8 . Carotids 2+ bilat; no bruits. No lymphadenopathy or thryomegaly appreciated. Cor: PMI nondisplaced. Regular rate & rhythm. No rubs,  or murmurs. + S3  Lungs: Coarse throughout.  Abdomen: soft, nontender, nondistended. No hepatosplenomegaly. No bruits or masses. Good bowel sounds. Extremities: no cyanosis, clubbing, rash, edema. R arm tender.  Neuro: alert & orientedx3, cranial nerves grossly intact. moves all 4 extremities w/o difficulty. Affect pleasant   Telemetry  ST 100s personally reviewed.   EKG    N/a   Labs    CBC Recent Labs    04/11/19 0207 04/12/19 0312  WBC 4.9 5.8  HGB  11.6* 11.0*  HCT 36.8* 35.0*  MCV 81.2 83.5  PLT 155 111*   Basic Metabolic Panel Recent Labs    41/93/79 0717 04/11/19 0207 04/12/19 0312  NA 135 134* 132*  K 3.9 4.6 5.4*  CL 98 97* 94*  CO2 28 28 29   GLUCOSE 115* 97 104*  BUN 21* 17 24*  CREATININE 1.00 0.88 1.08  CALCIUM 9.2 9.1 9.4  MG 1.6*  --   --    Liver Function Tests No results for input(s): AST, ALT, ALKPHOS, BILITOT, PROT, ALBUMIN in the last 72 hours. No results for input(s): LIPASE, AMYLASE in the last 72 hours. Cardiac Enzymes No results for input(s): CKTOTAL, CKMB, CKMBINDEX, TROPONINI in the last 72 hours.  BNP: BNP (last 3 results) Recent Labs    12/28/18 0251 01/24/19 1955 02/13/19 2243  BNP 848.5* 2,792.5* 3,902.9*    ProBNP (last 3 results) No results for input(s): PROBNP in the last 8760 hours.   D-Dimer No results for input(s): DDIMER in the last 72 hours. Hemoglobin A1C No results for input(s): HGBA1C in the last 72 hours. Fasting Lipid Panel No results for input(s): CHOL, HDL, LDLCALC, TRIG, CHOLHDL, LDLDIRECT in the last 72 hours. Thyroid Function Tests No results for input(s): TSH, T4TOTAL, T3FREE, THYROIDAB in the last 72 hours.  Invalid input(s): FREET3  Other results:   Imaging    No results found.   Medications:  Scheduled Medications: . aspirin EC  81 mg Oral Daily  . Chlorhexidine Gluconate Cloth  6 each Topical Daily  . digoxin  0.125 mg Oral Daily  . feeding supplement (ENSURE ENLIVE)  237 mL Oral BID BM  . furosemide  80 mg Intravenous BID  . mometasone-formoterol  2 puff Inhalation BID  . multivitamin with minerals  1 tablet Oral Daily  . pneumococcal 23 valent vaccine  0.5 mL Intramuscular Tomorrow-1000  . sodium chloride flush  3 mL Intravenous Q12H  . sodium chloride flush  3 mL Intravenous Q12H  . spironolactone  25 mg Oral Daily  . Warfarin - Pharmacist Dosing Inpatient   Does not apply q1800    Infusions: . sodium chloride    . sodium  chloride 10 mL/hr at 04/11/19 0500  . sodium chloride    . sodium chloride    . lactated ringers 10 mL/hr at 04/12/19 0600  . milrinone 0.375 mcg/kg/min (04/12/19 0600)    PRN Medications: sodium chloride, sodium chloride, sodium chloride, acetaminophen, albuterol, fentaNYL (SUBLIMAZE) injection, ondansetron (ZOFRAN) IV, sodium chloride flush, sodium chloride flush, sodium chloride flush     Assessment/Plan  1. Acute/Chronic Biventricular Heart Failure - ECHO 12/2018 EF 10-15% Severely reduced RV.  - NYHA IIIb. - Had cath with low output physiology and marked volume overload. . Milrinone increased to 0.375 mcg.CO-OX 64.5%. Remove swan.   Start to wean milrinone today. He is adamant he wants to go home tomorrow.  - Start lasix 80 mg po daily. Renal function stable. K high. Repeat now.    - No bb with low output and recent cocaine abuse.  - Continue digoxin 0.125 mg daily.  - Continue spironolactone to 25 mg daily.   - No Arb with hypotension.   - No entresto with h/o angioedema.  - I dont think he is a candidate for given ongoing drug use.   2. Apical Thrombus - Identified on ECHO 12/2018. At that time he refused to take coumadin. - On coumadin. INR 1.2   Will need INR check next week.   3. H/O MCA CVA 2019  - Continue atorvastatin 40 mg daily. Continue aspirin 81 mg daily.   4. Cocaine Abuse - UDS + in June   5. Social  - He can't pay for medications and does not have transportation to appointments. HFSW following. Continue HF Paramedicine  6. Tobacco Abuse - Discussed smoking cessation.   7. Periodontitis - S/P dental surgery with 5 teeth extracted - Dr Enrique Sack appreciated.   Move to [facility] He is adamant he is going home tomorrow. Will try and wean milrinone off.   Length of Stay: 3  Amy Clegg, NP  04/12/2019, 7:25 AM  Advanced Heart Failure Team Pager [phone] (M-F; 7a - 4p)  Please contact [facility] for night-coverage after hours (4p -7a ) and  weekends on [email or web address]  Patient seen and examined with the above-signed Advanced Practice Provider and/or Housestaff. I personally reviewed laboratory data, imaging studies and relevant notes. I independently examined the patient and formulated the important aspects of the plan. I have edited the note to reflect any of my changes or salient points. I have personally discussed the plan with the patient and/or family.  Milrinone increased yesterday due to recurrent shock after coming back from OR. Hemodynamics now look good on milrinone 0.375. Feeling much better except sore at R [facility] site. Mouth pain improved. Able to eat.   He is adamant that he needs to go home  tomorrow. Will get swan out. Wean down milrinone and try to adjust HF meds as best as possible. He realizes that he is at extremely high risk of immediate decompensation and possibly death but he says he is OK with it. Continue to load coumadin back.   Arvilla Meresaniel Anja Neuzil, MD  8:50 AM

## 2019-04-12 NOTE — Progress Notes (Signed)
   Today I met with Haim and his girlfriend, Pearson Grippe to discuss possible advanced therapies. He is a smoker and former cocaine abuse. He has not used cocaine in the last few months. Pearson Grippe said that he has good days and bad days but has been very limited at home. His father has LVAD and is followed at [facility]. He has a distant relationship with his father.   He is currently on milrinone 0.25 mcg with plan to wean and discharge tomorrow because he has funeral to attend. He understands he may decompensate again. We discussed LVAD and he is interested and understands he that he would need an extensive work up to qualify. I have asked LVAD Coordinator to meet with him today.   Not sure he would be a candidate with social barriers,long history tobacco abuse, and RV issues. RHC on 8/18 showed  PAPi 1.3 and RA/PCWP 0.5.  Hemodynamics improved with addition of milrinone but would not send home milrinone.     Kadarrius Yanke NP_C  3:42 PM

## 2019-04-12 NOTE — Progress Notes (Signed)
POST OPERATIVE NOTE:  04/12/2019 Bunnie Philips [id number]   VITALS: BP 121/90   Pulse (!) 53   Temp 99 F (37.2 C)   Resp (!) 21   Ht 5\' 8"  (1.727 m)   Wt 63.7 kg   SpO2 95%   BMI 21.35 kg/m   LABS:  Lab Results  Component Value Date   WBC 5.8 04/12/2019   HGB 11.0 (L) 04/12/2019   HCT 35.0 (L) 04/12/2019   MCV 83.5 04/12/2019   PLT 111 (L) 04/12/2019   BMET    Component Value Date/Time   NA 132 (L) 04/12/2019 0312   NA 143 12/01/2017 1000   K 5.4 (H) 04/12/2019 0312   CL 94 (L) 04/12/2019 0312   CO2 29 04/12/2019 0312   GLUCOSE 104 (H) 04/12/2019 0312   BUN 24 (H) 04/12/2019 0312   BUN 18 12/01/2017 1000   CREATININE 1.08 04/12/2019 0312   CALCIUM 9.4 04/12/2019 0312   GFRNONAA >60 04/12/2019 0312   GFRAA >60 04/12/2019 0312    Lab Results  Component Value Date   INR 1.2 04/12/2019   INR 1.5 (H) 04/11/2019   INR 1.7 (H) 04/10/2019   No results found for: PTT   Janson C Min is status post multiple dental extractions with alveoloplasty in the operating room with monitored anesthesia care on 04/11/2019.  SUBJECTIVE: Patient with minimal complaints. Denies having any active bleeding.   EXAM: There is no sign of infection, heme, or ooze.  Sutures are intact.  Clots are present.  ASSESSMENT: Post operative course is consistent with dental procedures performed in the operating room. Loss of teeth due to extraction  PLAN: 1.  Start salt water rinses every 2 hours while awake to aid healing. 2.  Advance diet as tolerated 3.  Follow-up with Dental Medicine for suture removal on 04/23/2019.  Patient to call for appointment. 4.  Patient acceptable for discharge from dental standpoint.   Lenn Cal, DDS

## 2019-04-12 NOTE — Progress Notes (Signed)
[place] for Heparin> warfarin Indication: hx apical thrombus  Allergies  Allergen Reactions  . Clindamycin/Lincomycin Swelling and Palpitations  . Hydrocodone Hives  . Lisinopril Swelling and Other (See Comments)    Facial swelling/angioedema  . Prednisone Shortness Of Breath, Nausea Only, Swelling and Other (See Comments)    Also made chest feel tight and genital area, legs, and face became swollen badly  . Bactrim [Sulfamethoxazole-Trimethoprim] Other (See Comments)    unknown  . Penicillins Hives and Swelling     Has patient had a PCN reaction causing immediate rash, facial/tongue/throat swelling, SOB or lightheadedness with hypotension: Yes Has patient had a PCN reaction causing severe rash involving mucus membranes or skin necrosis: No Has patient had a PCN reaction that required hospitalization: No Has patient had a PCN reaction occurring within the last 10 years: No If all of the above answers are "NO", then may proceed with Cephalosporin use.     Patient Measurements: Height: 5\' 8"  (172.7 cm) Weight: 140 lb 6.9 oz (63.7 kg) IBW/kg (Calculated) : 68.4 Heparin Dosing Weight: 62.1 kg  Vital Signs: Temp: 98.8 F (37.1 C) (08/21 0750) BP: 116/92 (08/21 0750)  Labs: Recent Labs    04/09/19 2214 04/10/19 0717 04/11/19 0207 04/12/19 0312  HGB  --  11.4* 11.6* 11.0*  HCT  --  35.8* 36.8* 35.0*  PLT  --  150 155 111*  LABPROT  --  20.1* 17.7* 15.1  INR  --  1.7* 1.5* 1.2  HEPARINUNFRC 0.20* 0.35 0.33  --   CREATININE  --  1.00 0.88 1.08    Estimated Creatinine Clearance: 77.8 mL/min (by C-G formula based on SCr of 1.08 mg/dL).   Medical History: Past Medical History:  Diagnosis Date  . Asthma   . Chronic systolic CHF (congestive heart failure) ([place])   . Cigarette smoker   . CKD (chronic kidney disease), stage II    Archie Endo 10/01/2017  . COPD (chronic obstructive pulmonary disease) ([place]) 10/21/2017   on CT scan chest   . History of echocardiogram    a. Echo 5/17 - EF 20-25%, severe diff HK, restrictive physiology, mild to mod MR, severe reduced RVSF, mod RVE, mild RAE, mod TR, PASP 48 mmHg  . Hx of cardiac cath    a. LHC 5/17 - normal coronary arteries. PA 45/25, mean 33, PCWP mean 18  . NICM (nonischemic cardiomyopathy) ([place])   . Stroke ([facility]) 09/27/2017   "was weak on my left side; I'm fully recovered" (11/09/2017)  . Substance abuse (HCC)    cocaine, marijuana    Medications:  Scheduled:  . aspirin EC  81 mg Oral Daily  . Chlorhexidine Gluconate Cloth  6 each Topical Daily  . digoxin  0.125 mg Oral Daily  . feeding supplement (ENSURE ENLIVE)  237 mL Oral BID BM  . furosemide  80 mg Oral Daily  . mometasone-formoterol  2 puff Inhalation BID  . multivitamin with minerals  1 tablet Oral Daily  . pneumococcal 23 valent vaccine  0.5 mL Intramuscular Tomorrow-1000  . sodium chloride flush  3 mL Intravenous Q12H  . sodium chloride flush  3 mL Intravenous Q12H  . spironolactone  25 mg Oral Daily  . Warfarin - Pharmacist Dosing Inpatient   Does not apply q1800    Assessment: 72 yom admitted with mixed LV/RV dysfunction - on warfarin PTA for hx of apical thrombus. Last dose on 8/17. Plan for dental tooth extraction per Dr Enrique Sack on 8/20.  PTA regimen reported is 2.5 mg daily except 5 mg on Sunday (per last anti-coag appt regimen is 5 mg daily except 2.5 mg on Tuesday).   INR today 1.2 is subtherapeutic. Discussion between DDS and HF MD  - no heparin drip for now -patient now stating he wants to go home tomorrow    Goal of Therapy:  INR 2-3 Heparin level 0.3-0.7 units/ml Monitor platelets by anticoagulation protocol: Yes   Plan:  Warfarin 7.5mg  x1 today Daily CBC, INR Likely will need 5 mg daily at time of discharge if goes home over the weekend  Danae Orleans, PharmD PGY2 Cardiology Pharmacy Resident Phone [phone] 04/12/2019       8:57 AM  Please check [email or web address] for  unit-specific pharmacist phone numbers

## 2019-04-12 NOTE — Progress Notes (Signed)
Pt drinking first bottle of contrast at this time.

## 2019-04-12 NOTE — Progress Notes (Signed)
PFT order placed at 1628.  Called unit 2C pt is planning to discharged home 04/13/2019. Will do PFT study as OP.  Will call pt on Monday to schedule as OP.  Pt has no surgery date scheduled as of today.

## 2019-04-12 NOTE — Progress Notes (Signed)
Notified Dr. Sonia Side that after redressing swanz catheter, found dressing wet, checked connections, stopped IV fluids and redressed site.  Pressure bag remains connected, but not performing CO at this time.  Have rechecked dressing multiple times and still remains dry at this time.

## 2019-04-12 NOTE — TOC Initial Note (Signed)
Transition of Care [facility]) - Initial/Assessment Note    Patient Details  Name: Mark [name] MRN: [id number] Date of Birth: [birth year]  Transition of Care [facility]) CM/SW Contact:    Leone [place], RN Phone Number: 04/12/2019, 6:08 PM  Clinical Narrative:                 Patient lives with girlfriend, NCM offered [facility] for CHF for disease management, he states he has somone that comes out from CHW clinic that checks his vitals and saturations , he does not need a lot of people coming out to his home, so he does not want a HHRN.  Expected Discharge Plan: Home w Home Health Services Barriers to Discharge: No Barriers Identified   Patient Goals and CMS Choice Patient states their goals for this hospitalization and ongoing recovery are:: to get better CMS Medicare.gov Compare Post Acute Care list provided to:: Patient    Expected Discharge Plan and Services Expected Discharge Plan: Home w Home Health Services   Discharge Planning Services: CM Consult   Living arrangements for the past 2 months: Single Family Home Expected Discharge Date: 04/15/19               DME Arranged: (NA)         HH Arranged: Patient Refused HH          Prior Living Arrangements/Services Living arrangements for the past 2 months: Single Family Home Lives with:: Significant Other Patient language and need for interpreter reviewed:: Yes Do you feel safe going back to the place where you live?: Yes      Need for Family Participation in Patient Care: No (Comment) Care giver support system in place?: No (comment)   Criminal Activity/Legal Involvement Pertinent to Current Situation/Hospitalization: No - Comment as needed  Activities of Daily Living Home Assistive Devices/Equipment: None ADL Screening (condition at time of admission) Patient's cognitive ability adequate to safely complete daily activities?: Yes Is the patient deaf or have difficulty hearing?: No Does the patient have difficulty  seeing, even when wearing glasses/contacts?: No Does the patient have difficulty concentrating, remembering, or making decisions?: No Patient able to express need for assistance with ADLs?: Yes Does the patient have difficulty dressing or bathing?: No Independently performs ADLs?: Yes (appropriate for developmental age) Does the patient have difficulty walking or climbing stairs?: Yes Weakness of Legs: Both Weakness of Arms/Hands: Both  Permission Sought/Granted                  Emotional Assessment Appearance:: Appears stated age Attitude/Demeanor/Rapport: Engaged Affect (typically observed): Appropriate Orientation: : Oriented to Self, Oriented to Place, Oriented to  Time, Oriented to Situation Alcohol / Substance Use: Illicit Drugs Psych Involvement: No (comment)  Admission diagnosis:  Cardiogenic shock (HCC) [R57.0] Patient Active Problem List   Diagnosis Date Noted  . Malnutrition of moderate degree 04/10/2019  . Cardiogenic shock (HCC) 04/09/2019  . Allergic reaction caused by a drug 02/14/2019  . Abnormal liver function 02/14/2019  . Encounter for therapeutic drug monitoring 02/13/2019  . Encounter to establish care 02/13/2019  . 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 Acute CHF (congestive heart failure) (HCC) 01/24/2019  . Shortness of breath   . Cocaine use   . Acute on chronic systolic CHF (congestive heart failure) (HCC) 12/25/2018  . Demand ischemia (HCC)   . LV (left ventricular) mural thrombus without MI   . Noncompliance   . Chronic anemia   . Acute systolic CHF (congestive heart failure) (  [place]) 11/09/2017  . History of right MCA stroke 09/28/2017  . Stroke ([place]) 09/28/2017  . Chronic systolic heart failure ([place]) 01/11/2016  . NICM (nonischemic cardiomyopathy) ([place]) 01/11/2016  . CKD (chronic kidney disease) stage 2, GFR 60-89 ml/min 01/11/2016  . Cocaine abuse ([place]) 01/11/2016  . Chest pain, pleuritic 01/03/2016  . Tobacco abuse 01/03/2016  . Asthma  01/03/2016  . Leg swelling 01/03/2016  . Tachycardia 01/03/2016  . Normocytic anemia 01/03/2016  . Elevated troponin I level 01/03/2016  . Right rib fracture 01/03/2016  . Chest pain    PCP:  Kerin Perna, NP Pharmacy:   [facility], [address] [place] [address] Phone: [phone] Fax: [place], [place] - [address] [facility]. [address] [place] [address] Phone: [phone] Fax: [phone]     Social Determinants of Health (SDOH) Interventions    Readmission Risk Interventions Readmission Risk Prevention Plan 04/12/2019 02/14/2019 01/25/2019  Transportation Screening Complete Complete Complete  PCP or Specialist Appt within 3-5 Days - Not Complete Complete  Not Complete comments - continue to assess prior d/c. -  HRI or [place] - Not Complete Complete  HRI or Home Care Consult comments - continue to assess -  Social Work Consult for [place] Planning/Counseling - Not Complete Complete  SW consult not completed comments - continue to assess -  Palliative Care Screening - Not Applicable Complete  Medication Review (RN Care Manager) Complete Not Complete Complete  Med Review Comments - continue to assess till d/c -  PCP or Specialist appointment within 3-5 days of discharge Complete - -  [place] or Home Care Consult Complete - -  SW Recovery Care/Counseling Consult Complete - -  Palliative Care Screening Not Applicable - -  [place] Not Applicable - -  Some recent data might be hidden

## 2019-04-13 ENCOUNTER — Inpatient Hospital Stay (HOSPITAL_COMMUNITY): Payer: Medicaid Other

## 2019-04-13 ENCOUNTER — Encounter (HOSPITAL_COMMUNITY): Payer: Medicaid Other

## 2019-04-13 ENCOUNTER — Other Ambulatory Visit (HOSPITAL_COMMUNITY): Payer: Medicaid Other

## 2019-04-13 LAB — CBC
HCT: 33.3 % — ABNORMAL LOW (ref 39.0–52.0)
Hemoglobin: 10.8 g/dL — ABNORMAL LOW (ref 13.0–17.0)
MCH: 26.3 pg (ref 26.0–34.0)
MCHC: 32.4 g/dL (ref 30.0–36.0)
MCV: 81 fL (ref 80.0–100.0)
Platelets: 122 10*3/uL — ABNORMAL LOW (ref 150–400)
RBC: 4.11 MIL/uL — ABNORMAL LOW (ref 4.22–5.81)
RDW: 17.1 % — ABNORMAL HIGH (ref 11.5–15.5)
WBC: 5.4 10*3/uL (ref 4.0–10.5)
nRBC: 0 % (ref 0.0–0.2)

## 2019-04-13 LAB — BASIC METABOLIC PANEL
Anion gap: 8 (ref 5–15)
BUN: 24 mg/dL — ABNORMAL HIGH (ref 6–20)
CO2: 30 mmol/L (ref 22–32)
Calcium: 9 mg/dL (ref 8.9–10.3)
Chloride: 92 mmol/L — ABNORMAL LOW (ref 98–111)
Creatinine, Ser: 0.93 mg/dL (ref 0.61–1.24)
GFR calc Af Amer: >60 mL/min (ref >60)
GFR calc non Af Amer: >60 mL/min (ref >60)
Glucose, Bld: 119 mg/dL — ABNORMAL HIGH (ref 70–99)
Potassium: 4.8 mmol/L (ref 3.5–5.1)
Sodium: 130 mmol/L — ABNORMAL LOW (ref 135–145)

## 2019-04-13 LAB — HEPATITIS C ANTIBODY: HCV Ab: <0.1 s/co ratio (ref 0.0–0.9)

## 2019-04-13 LAB — PROTIME-INR
INR: 1.4 — ABNORMAL HIGH (ref 0.8–1.2)
Prothrombin Time: 17 seconds — ABNORMAL HIGH (ref 11.4–15.2)

## 2019-04-13 LAB — MAGNESIUM: Magnesium: 1.8 mg/dL (ref 1.7–2.4)

## 2019-04-13 LAB — HEPATITIS B SURFACE ANTIGEN: Hepatitis B Surface Ag: NEGATIVE

## 2019-04-13 LAB — HEPATITIS B SURFACE ANTIBODY, QUANTITATIVE: Hep B S AB Quant (Post): <3.1 m[IU]/mL — ABNORMAL LOW (ref >9.9)

## 2019-04-13 LAB — HIV ANTIBODY (ROUTINE TESTING W REFLEX): HIV Screen 4th Generation wRfx: NONREACTIVE

## 2019-04-13 LAB — HEPATITIS B CORE ANTIBODY, TOTAL: Hep B Core Total Ab: NEGATIVE

## 2019-04-13 MED ORDER — IOHEXOL 300 MG/ML  SOLN
100.0000 mL | Freq: Once | INTRAMUSCULAR | Status: AC | PRN
  Administered 2019-04-13: 100 mL via INTRAVENOUS

## 2019-04-13 MED ORDER — WARFARIN SODIUM 5 MG PO TABS: 5.0000 mg | ORAL_TABLET | Freq: Every day | ORAL | 1 refills | Status: DC

## 2019-04-13 MED ORDER — BUDESONIDE-FORMOTEROL FUMARATE 80-4.5 MCG/ACT IN AERO: 2.0000 | INHALATION_SPRAY | Freq: Two times a day (BID) | RESPIRATORY_TRACT | 3 refills | Status: DC

## 2019-04-13 NOTE — Progress Notes (Signed)
Second IV started in left [facility] by IV team. CT notified. More contrast required due to amount of time since prior contrast finished.

## 2019-04-13 NOTE — Progress Notes (Signed)
Advanced Heart Failure Rounding Note  PCP-Cardiologist: Verne Carrow, MD   Subjective:    Remains on milrinone 0.25. Eager to go home today for a family funeral.   Mouth pain ok.   Had trouble getting IV in last night for CT. Drinking contrast now. Denies SOB, orthopnea or PND   Objective:   Weight Range: 63.9 kg Body mass index is 21.42 kg/m.   Vital Signs:   Temp:  [97.8 F (36.6 C)-99.1 F (37.3 C)] 98 F (36.7 C) (08/22 0756) Pulse Rate:  [99-104] 99 (08/22 0349) Resp:  [18-25] 20 (08/22 0349) BP: (93-122)/(69-94) 108/83 (08/22 0756) SpO2:  [95 %-98 %] 96 % (08/22 0756) Weight:  [63.9 kg] 63.9 kg (08/22 0349) Last BM Date: 04/12/19  Weight change: Filed Weights   04/11/19 0500 04/12/19 0500 04/13/19 0349  Weight: 67.2 kg 63.7 kg 63.9 kg    Intake/Output:   Intake/Output Summary (Last 24 hours) at 04/13/2019 0820 Last data filed at 04/13/2019 0600 Gross per 24 hour  Intake 100.15 ml  Output 4551 ml  Net -4450.85 ml      Physical Exam   General:  Sitting up in bed. No resp difficulty HEENT: normal x edentulous Neck: supple. no JVD. Carotids 2+ bilat; no bruits. No lymphadenopathy or thryomegaly appreciated. Cor: PMI nondisplaced. Regular tachy +s3. No rubs, gallops or murmurs. Lungs: clear Abdomen: soft, nontender, nondistended. No hepatosplenomegaly. No bruits or masses. Good bowel sounds. Extremities: no cyanosis, clubbing, rash, edema Neuro: alert & orientedx3, cranial nerves grossly intact. moves all 4 extremities w/o difficulty. Affect pleasant    Telemetry  ST 90-105 personally reviewed.   EKG    N/a   Labs    CBC Recent Labs    04/12/19 1817 04/13/19 0243  WBC 5.4 5.4  NEUTROABS 3.2  --   HGB 11.4* 10.8*  HCT 35.7* 33.3*  MCV 81.0 81.0  PLT 129* 122*   Basic Metabolic Panel Recent Labs    21/30/86 1817 04/13/19 0243  NA 132* 130*  K 4.7 4.8  CL 92* 92*  CO2 30 30  GLUCOSE 121* 119*  BUN 28* 24*   CREATININE 1.20 0.93  CALCIUM 9.3 9.0  MG 1.8 1.8   Liver Function Tests Recent Labs    04/12/19 1817  AST 31  ALT 20  ALKPHOS 89  BILITOT 1.6*  PROT 7.3  ALBUMIN 3.6   No results for input(s): LIPASE, AMYLASE in the last 72 hours. Cardiac Enzymes No results for input(s): CKTOTAL, CKMB, CKMBINDEX, TROPONINI in the last 72 hours.  BNP: BNP (last 3 results) Recent Labs    12/28/18 0251 01/24/19 1955 02/13/19 2243  BNP 848.5* 2,792.5* 3,902.9*    ProBNP (last 3 results) No results for input(s): PROBNP in the last 8760 hours.   D-Dimer No results for input(s): DDIMER in the last 72 hours. Hemoglobin A1C Recent Labs    04/12/19 1817  HGBA1C 6.4*   Fasting Lipid Panel Recent Labs    04/12/19 1817  CHOL 118  HDL 40*  LDLCALC 69  TRIG 43  CHOLHDL 3.0   Thyroid Function Tests Recent Labs    04/12/19 1817  TSH 1.250    Other results:   Imaging    Dg Chest 2 View  Result Date: 04/12/2019 CLINICAL DATA:  Preoperative evaluation. EXAM: CHEST - 2 VIEW COMPARISON:  Radiograph 04/10/2019 FINDINGS: Previous right Swan-Ganz catheter is been removed. Cardiomegaly is not significantly changed. Unchanged mediastinal contours. No pulmonary edema, focal airspace disease, pleural  effusion or pneumothorax. No acute osseous abnormalities. IMPRESSION: Stable cardiomegaly. No acute chest findings. Electronically Signed   By: Keith Rake M.D.   On: 04/12/2019 21:23     Medications:     Scheduled Medications: . aspirin EC  81 mg Oral Daily  . Chlorhexidine Gluconate Cloth  6 each Topical Daily  . digoxin  0.125 mg Oral Daily  . feeding supplement (ENSURE ENLIVE)  237 mL Oral BID BM  . furosemide  80 mg Oral Daily  . mometasone-formoterol  2 puff Inhalation BID  . multivitamin with minerals  1 tablet Oral Daily  . sodium chloride flush  3 mL Intravenous Q12H  . sodium chloride flush  3 mL Intravenous Q12H  . Warfarin - Pharmacist Dosing Inpatient   Does not  apply q1800    Infusions: . sodium chloride    . sodium chloride 10 mL/hr at 04/11/19 0500  . sodium chloride    . sodium chloride    . milrinone 0.25 mcg/kg/min (04/13/19 0500)    PRN Medications: sodium chloride, sodium chloride, sodium chloride, acetaminophen, albuterol, fentaNYL (SUBLIMAZE) injection, ondansetron (ZOFRAN) IV, sodium chloride flush, sodium chloride flush, sodium chloride flush     Assessment/Plan  1. Acute/Chronic Biventricular Heart Failure - ECHO 12/2018 EF 10-15% Severely reduced RV.  - NYHA IIIb. - Had cath with low output physiology and marked volume overload. . Milrinone increased to 0.375 mcg.CO-OX 64.5%. Remove swan.  - Milrinone down to 0.25. No swan for c-ox He is adamant he wants to go home today.  - Will stop milrinone this am and watch for a few hours. If stable can go home although he realizes there is a very high risk for rapid decompnesation  - No bb with low output and recent cocaine abuse.  - Continue digoxin 0.125 mg daily.  - Continue spironolactone to 25 mg daily.   - No Arb with hypotension.   - No entresto with h/o angioedema.  - Seen by VAD team for possible advanced therapies but has multiple hurdles to overcome and RV function also a challenge. CT today  2. Apical Thrombus - Identified on ECHO 12/2018.  - On coumadin. INR 1.4   - Will need INR check next week.   3. H/O MCA CVA 2019  - Continue atorvastatin 40 mg daily. Continue aspirin 81 mg daily.   4. Cocaine Abuse - UDS + in June   5. Social  - He can't pay for medications and does not have transportation to appointments. HFSW following. Continue HF Paramedicine  6. Tobacco Abuse - Discussed smoking cessation.   7. Periodontitis - S/P dental surgery with 5 teeth extracted - Dr Enrique Sack appreciated.   D/c meds Ecasa 81 Digoxin 0.125  Lasix 80 daily Spiro 25 Kdur 20 daily  Warfarin   Can adjust as outpatient. Would try to add low-dose Bidil soon. Will  need med assistance.   Length of Stay: 4  Glori Bickers, MD  04/13/2019, 8:20 AM  Advanced Heart Failure Team Pager [phone] (M-F; 7a - 4p)  Please contact [facility] for night-coverage after hours (4p -7a ) and weekends on [email or web address]

## 2019-04-13 NOTE — Progress Notes (Signed)
Attempted to start second IV for CT since milrinone cannot be stopped. Unable to gain access.

## 2019-04-13 NOTE — Discharge Summary (Addendum)
Advanced Heart Failure Team  Discharge Summary   Patient ID: Mark [name] MRN: [id number], DOB/AGE: October 21, 1975 43 y.o. Admit date: 04/09/2019 D/C date:     04/13/2019   Primary Discharge Diagnoses:  1. Dental caries and chronic apical periodontitis 2. Acute on chronic biventricular systolic HF with EF 46% -> cardiogenic shock 3. Non-ischemic cardiomyopathy 4. LV thrombus with h/o MCA stroke 2019 5. Polysubstance abuse 6. Tobacco abuse   Hospital Course:   Patient admitted for HF tune-up prior to dental extractions. Coumadin stopped as outpatient. On admit underwent RHC which showed low output HF and marked volume overload. Started on milrinone and IV diuresis with excellent response. On 8/20 went to OR with Dr. Enrique Sack for extraction of 6 teeth with alveoplasty under GA. Upon return to CCU patient was in cardiogenic shock with volume overload. Milrinone increased from 0.25 -> 0.375. Improved rapidly. Long d/w patient about need to consider advanced therapies and he met with VAD team. [name] he might be interested but he needed to go home today for a funeral. Was informed of high risk of rapid decompensation but he was adamant about leaving for family funeral. Prior to d/c CT C/A/P performed as part of VAD w/u. Counseled on need to stop smoking and be compliant with office visits and meds.   Warfarin restarted after surgery and INR 1.4 at time of d/c.   Milrinone weaned off rapidly and patient d/c'd home with rapid f/u in HF Clinic.    RHC 04/09/19 RA = 16 RV = 51/8 PA =  49/28 (38) PCW = 30 Fick cardiac output/index = 2.6/1.5 Thermo CO/CI = 3.42/1.9 PVR = 3.1 WU FA sat = 99% PA sat = 44%, 45% PaPI = 1.3 RA/PCWP = 0.53  On milrinone 0.25 ->  CVP 8  PAP 56/30  CO 5.4/CI 3  Discharge Weight Range: 140 pounds Discharge Vitals: Blood pressure 108/83, pulse (!) 107, temperature 98 F (36.7 C), temperature source Oral, resp. rate 20, height _0  (1.727 m), weight 63.9 kg, SpO2 96  %.  Labs: Lab Results  Component Value Date   WBC 5.4 04/13/2019   HGB 10.8 (L) 04/13/2019   HCT 33.3 (L) 04/13/2019   MCV 81.0 04/13/2019   PLT 122 (L) 04/13/2019    Recent Labs  Lab 04/12/19 1817 04/13/19 0243  NA 132* 130*  K 4.7 4.8  CL 92* 92*  CO2 30 30  BUN 28* 24*  CREATININE 1.20 0.93  CALCIUM 9.3 9.0  PROT 7.3  --   BILITOT 1.6*  --   ALKPHOS 89  --   ALT 20  --   AST 31  --   GLUCOSE 121* 119*   Lab Results  Component Value Date   CHOL 118 04/12/2019   HDL 40 (L) 04/12/2019   LDLCALC 69 04/12/2019   TRIG 43 04/12/2019   BNP (last 3 results) Recent Labs    12/28/18 0251 01/24/19 1955 02/13/19 2243  BNP 848.5* 2,792.5* 3,902.9*   Diagnostic Studies/Procedures   Dg Chest 2 View  Result Date: 04/12/2019 CLINICAL DATA:  Preoperative evaluation. EXAM: CHEST - 2 VIEW COMPARISON:  Radiograph 04/10/2019 FINDINGS: Previous right Swan-Ganz catheter is been removed. Cardiomegaly is not significantly changed. Unchanged mediastinal contours. No pulmonary edema, focal airspace disease, pleural effusion or pneumothorax. No acute osseous abnormalities. IMPRESSION: Stable cardiomegaly. No acute chest findings. Electronically Signed   By: Keith Rake M.D.   On: 04/12/2019 21:23    Discharge Medications   Allergies as of  04/13/2019      Reactions   Clindamycin/lincomycin Swelling, Palpitations   Hydrocodone Hives   Lisinopril Swelling, Other (See Comments)   Facial swelling/angioedema   Prednisone Shortness Of Breath, Nausea Only, Swelling, Other (See Comments)   Also made chest feel tight and genital area, legs, and face became swollen badly   Bactrim [sulfamethoxazole-trimethoprim] Other (See Comments)   unknown   Penicillins Hives, Swelling   Has patient had a PCN reaction causing immediate rash, facial/tongue/throat swelling, SOB or lightheadedness with hypotension: Yes Has patient had a PCN reaction causing severe rash involving mucus membranes or skin  necrosis: No Has patient had a PCN reaction that required hospitalization: No Has patient had a PCN reaction occurring within the last 10 years: No If all of the above answers are "NO", then may proceed with Cephalosporin use.      Medication List    TAKE these medications   albuterol 108 (90 Base) MCG/ACT inhaler Commonly known as: VENTOLIN HFA Inhale 1-2 puffs into the lungs every 4 (four) hours as needed for wheezing or shortness of breath.   aspirin 81 MG EC tablet Take 1 tablet (81 mg total) by mouth daily.   budesonide-formoterol 80-4.5 MCG/ACT inhaler Commonly known as: SYMBICORT Inhale 2 puffs into the lungs 2 (two) times daily.   digoxin 0.125 MG tablet Commonly known as: LANOXIN Take 1 tablet (0.125 mg total) by mouth daily.   ferrous sulfate 325 (65 FE) MG EC tablet Take 325 mg by mouth daily.   furosemide 80 MG tablet Commonly known as: LASIX Take 1 tablet (80 mg total) by mouth daily.   multivitamin with minerals Tabs tablet Take 1 tablet by mouth daily.   potassium chloride SA 20 MEQ tablet Commonly known as: K-DUR Take 1 tablet (20 mEq total) by mouth daily. Take 24mq with Metolazone.   spironolactone 25 MG tablet Commonly known as: ALDACTONE Take 1 tablet (25 mg total) by mouth daily.   warfarin 5 MG tablet Commonly known as: COUMADIN Take as directed. If you are unsure how to take this medication, talk to your nurse or doctor. Original instructions: Take 1 tablet (5 mg total) by mouth daily at 6 PM. What changed: See the new instructions.       Disposition   The patient will be discharged in stable condition to home. Discharge Instructions    (HEART FAILURE PATIENTS) Call MD:  Anytime you have any of the following symptoms: 1) 3 pound weight gain in 24 hours or 5 pounds in 1 week 2) shortness of breath, with or without a dry hacking cough 3) swelling in the hands, feet or stomach 4) if you have to sleep on extra pillows at night in order to  breathe.   Complete by: As directed    Call MD for:  difficulty breathing, headache or visual disturbances   Complete by: As directed    Call MD for:  persistant dizziness or light-headedness   Complete by: As directed    Call MD for:  persistant nausea and vomiting   Complete by: As directed    Call MD for:  redness, tenderness, or signs of infection (pain, swelling, redness, odor or green/yellow discharge around incision site)   Complete by: As directed    Call MD for:  severe uncontrolled pain   Complete by: As directed    Call MD for:  temperature >100.4   Complete by: As directed    Diet - low sodium heart healthy   Complete  by: As directed    Heart Failure patients record your daily weight using the same scale at the same time of day   Complete by: As directed    Increase activity slowly   Complete by: As directed    STOP any activity that causes chest pain, shortness of breath, dizziness, sweating, or exessive weakness   Complete by: As directed      Follow-up Information    Lenn Cal, DDS. Schedule an appointment as soon as possible for a visit on 04/23/2019.   Specialty: Dentistry Why: Call for suture removal appt. for 04/23/19 Contact information: [place] [address] [phone]        [facility] Follow up on 04/17/2019.   Specialty: Cardiology - [facility]IN CLINIC Why: 2:00 Contact information: [address], [address] [employer] [place], Amy D, NP Follow up on 04/30/2019.   Specialty: Cardiology Why: 11:30 AM - CHF Clinic Contact information: [address] [place] [address] [phone]             Duration of Discharge Encounter: Greater than 35 minutes   Signed, Glori Bickers, MD 04/13/2019, 9:38 AM

## 2019-04-13 NOTE — Progress Notes (Signed)
Patient had CT of chest, abd & pelvis done in the morning. Removed PIV access and patient received discharge instructions, there is no changes medications except coumadin dose. Patient understood it. Patient took his all belongings. HS [employer]

## 2019-04-15 ENCOUNTER — Telehealth (HOSPITAL_COMMUNITY): Payer: Self-pay | Admitting: Licensed Clinical Social Worker

## 2019-04-15 ENCOUNTER — Telehealth (INDEPENDENT_AMBULATORY_CARE_PROVIDER_SITE_OTHER): Payer: Self-pay

## 2019-04-15 LAB — LUPUS ANTICOAGULANT PANEL
DRVVT: 39.3 s (ref 0.0–47.0)
PTT Lupus Anticoagulant: 33.3 s (ref 0.0–51.9)

## 2019-04-15 MED FILL — DIGOXIN 0.125 MG TABLET: 125 | 30 days supply | Qty: 30 | Fill #2

## 2019-04-15 MED FILL — SYMBICORT 80-4.5 MCG INH: 80-4.5 | 30 days supply | Qty: 10 | Fill #2

## 2019-04-15 NOTE — Telephone Encounter (Signed)
Patient date of birth verified. He is aware that results for lupus are negative. Nat Christen, CMA

## 2019-04-15 NOTE — Telephone Encounter (Signed)
Pt reached out to CSW to ask for assistance with transportation to coumadin appt on Wednesday and respiratory appt on Friday- CSW confirmed we can assist with pt getting to these appts.  CSW will continue to follow and assist as needed  Jorge Ny, [facility] Desk#: [phone] Cell#: [phone]

## 2019-04-15 NOTE — Telephone Encounter (Signed)
-----   Message from Kerin Perna, NP sent at 04/15/2019  3:02 PM EDT ----- Lab results are negative for Lupus

## 2019-04-16 ENCOUNTER — Other Ambulatory Visit (HOSPITAL_COMMUNITY): Payer: Self-pay

## 2019-04-16 NOTE — Progress Notes (Signed)
Paramedicine Encounter    Patient ID: Mark [name], male    DOB: 15-Jan-1976, 43 y.o.   MRN: [id number]    Patient Care Team: Kerin Perna, NP as PCP - General (Internal Medicine) Burnell Blanks, MD as PCP - Cardiology (Cardiology)  Patient Active Problem List   Diagnosis Date Noted  . Malnutrition of moderate degree 04/10/2019  . Cardiogenic shock ([place]) 04/09/2019  . Allergic reaction caused by a drug 02/14/2019  . Abnormal liver function 02/14/2019  . Encounter for therapeutic drug monitoring 02/13/2019  . Encounter to establish care 02/13/2019  . 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 Acute CHF (congestive heart failure) ([place]) 01/24/2019  . Shortness of breath   . Cocaine use   . Acute on chronic systolic CHF (congestive heart failure) ([place]) 12/25/2018  . Demand ischemia ([place])   . LV (left ventricular) mural thrombus without MI   . Noncompliance   . Chronic anemia   . Acute systolic CHF (congestive heart failure) ([place]) 11/09/2017  . History of right MCA stroke 09/28/2017  . Stroke ([place]) 09/28/2017  . Chronic systolic heart failure ([place]) 01/11/2016  . NICM (nonischemic cardiomyopathy) ([place]) 01/11/2016  . CKD (chronic kidney disease) stage 2, GFR 60-89 ml/min 01/11/2016  . Cocaine abuse ([place]) 01/11/2016  . Chest pain, pleuritic 01/03/2016  . Tobacco abuse 01/03/2016  . Asthma 01/03/2016  . Leg swelling 01/03/2016  . Tachycardia 01/03/2016  . Normocytic anemia 01/03/2016  . Elevated troponin I level 01/03/2016  . Right rib fracture 01/03/2016  . Chest pain     Current Outpatient Medications:  .  digoxin (LANOXIN) 0.125 MG tablet, Take 1 tablet (0.125 mg total) by mouth daily., Disp: 30 tablet, Rfl: 5 .  ferrous sulfate 325 (65 FE) MG EC tablet, Take 325 mg by mouth daily., Disp: , Rfl:  .  furosemide (LASIX) 80 MG tablet, Take 1 tablet (80 mg total) by mouth daily., Disp: 30 tablet, Rfl: 3 .  Multiple Vitamin (MULTIVITAMIN WITH MINERALS) TABS tablet, Take  1 tablet by mouth daily., Disp: , Rfl:  .  potassium chloride SA (K-DUR) 20 MEQ tablet, Take 1 tablet (20 mEq total) by mouth daily. Take 59meq with Metolazone., Disp: 60 tablet, Rfl: 0 .  spironolactone (ALDACTONE) 25 MG tablet, Take 1 tablet (25 mg total) by mouth daily., Disp: 60 tablet, Rfl: 6 .  warfarin (COUMADIN) 5 MG tablet, Take 1 tablet (5 mg total) by mouth daily at 6 PM., Disp: 30 tablet, Rfl: 1 .  albuterol (VENTOLIN HFA) 108 (90 Base) MCG/ACT inhaler, Inhale 1-2 puffs into the lungs every 4 (four) hours as needed for wheezing or shortness of breath., Disp: 1 Inhaler, Rfl: 4 .  aspirin 81 MG EC tablet, Take 1 tablet (81 mg total) by mouth daily., Disp: 30 tablet, Rfl: 4 .  budesonide-formoterol (SYMBICORT) 80-4.5 MCG/ACT inhaler, Inhale 2 puffs into the lungs 2 (two) times daily., Disp: 1 Inhaler, Rfl: 3 Allergies  Allergen Reactions  . Clindamycin/Lincomycin Swelling and Palpitations  . Hydrocodone Hives  . Lisinopril Swelling and Other (See Comments)    Facial swelling/angioedema  . Prednisone Shortness Of Breath, Nausea Only, Swelling and Other (See Comments)    Also made chest feel tight and genital area, legs, and face became swollen badly  . Bactrim [Sulfamethoxazole-Trimethoprim] Other (See Comments)    unknown  . Penicillins Hives and Swelling     Has patient had a PCN reaction causing immediate rash, facial/tongue/throat swelling, SOB or lightheadedness with hypotension: Yes Has patient  had a PCN reaction causing severe rash involving mucus membranes or skin necrosis: No Has patient had a PCN reaction that required hospitalization: No Has patient had a PCN reaction occurring within the last 10 years: No If all of the above answers are "NO", then may proceed with Cephalosporin use.      Social History   Socioeconomic History  . Marital status: Divorced    Spouse name: Not on file  . Number of children: Not on file  . Years of education: Not on file  . Highest  education level: Not on file  Occupational History  . Not on file  Social Needs  . Financial resource strain: Not on file  . Food insecurity    Worry: Not on file    Inability: Not on file  . Transportation needs    Medical: Not on file    Non-medical: Not on file  Tobacco Use  . Smoking status: Former Smoker    Packs/day: 1.00    Years: 30.00    Pack years: 30.00    Types: Cigarettes    Quit date: 09/27/2017    Years since quitting: 1.5  . Smokeless tobacco: Never Used  Substance and Sexual Activity  . Alcohol use: Yes    Alcohol/week: 5.0 standard drinks    Types: 5 Shots of liquor per week    Frequency: Never    Comment: 5-6 shots of vodka daily; "stopped it all after I had stroke 09/27/2017"  . Drug use: Yes    Types: Cocaine, Marijuana    Comment: 11/09/2017 "none since 09/27/2017"  . Sexual activity: Not Currently  Lifestyle  . Physical activity    Days per week: Not on file    Minutes per session: Not on file  . Stress: Not on file  Relationships  . Social Musicianconnections    Talks on phone: Not on file    Gets together: Not on file    Attends religious service: Not on file    Active member of club or organization: Not on file    Attends meetings of clubs or organizations: Not on file    Relationship status: Not on file  . Intimate partner violence    Fear of current or ex partner: Not on file    Emotionally abused: Not on file    Physically abused: Not on file    Forced sexual activity: Not on file  Other Topics Concern  . Not on file  Social History Narrative  . Not on file    Physical Exam Pulmonary:     Effort: Pulmonary effort is normal. No respiratory distress.     Breath sounds: No wheezing or rales.  Musculoskeletal:        General: No swelling.     Right lower leg: No edema.     Left lower leg: No edema.  Skin:    General: Skin is warm and dry.         Future Appointments  Date Time Provider Department Center  04/17/2019  2:00 PM [facility]  04/19/2019  2:00 PM [facility] TECH [facility] None  04/30/2019 11:30 AM [facility] PA/NP [facility] None     BP (!) 84/60   Pulse 92   Temp (!) 97.4 F (36.3 C)   Wt 138 lb (62.6 kg)   SpO2 96%   BMI 20.98 kg/m   Weight yesterday-didn't weigh   ATF pt CAO x4 with no complaints. He said that he's out of  a couple of meds and the inhalers are at [facility] pharmacy and is ready for pick up. He stated that he feels good and has no complaints. He denies chest pain, dizziness and sob.  He's taken all of his meds except ferrous sulfate and asa.  His BP is low; he said that it's been running low.  I will verify the same with [facility].  rx bottles verified.  I will pick up his meds tomorrow, his wife will be unable to pick them up.    Medication ordered: Ferrous sulfate  Caidyn Henricksen, EMT Paramedic [phone] 04/16/2019    ACTION: Home visit completed

## 2019-04-16 NOTE — Progress Notes (Signed)
[facility] EDUCATION NOTE:                VAD evaluation consent reviewed and signed by Lemar Lofty Initial VAD teaching completed with pt and his girlfriend [place].   VAD educational packet including "HM III LVAD Patient Guide", "HM III Left Ventricular Assist System" packet, "[place]uniata HM III Patient Education", and "Decision Aids for Left Ventricular Assist Device" reviewed in detail and left at bedside for continued reference.  All questions answered regarding VAD implant, hospital stay, and what to expect when discharged home living with a heart pump.    Patient does not currently have a caregiver that he can identify. Ladonna Snide (patient's girlfriend) is not willing to commit to being a caregiver for patient at this time.  Explained need for 24/7 care when pt is discharged home due to sternal precautions, adaptation to living on support, emotional support, consistent and meticulous exit site care and management, medication adherence and high volume of follow up visits with the VAD Clinic after discharge; both pt and caregiver verbalized understanding of above.   Explained that LVAD can be implanted for two indications in the setting of advanced left ventricular heart failure treatment:  1. Bridge to transplant - used for patients who cannot safely wait for heart transplant without this device.  Or    2. Destination therapy - used for patients until end of life or recovery of heart function.  Patient and caregiver(s) acknowledge that the indication at this point in time for LVAD therapy would be for destination due to currently smoking.   Provided brief equipment overview and demonstration with HeartMate III training loop including discussion on the following:   a) power module   b) system controller   c) universal Magazine features editor   d) battery clips   e) Batteries   f)  Perc lock   g) Percutaneous lead   Reviewed and supplied a copy of home inspection check list stressing that only three  pronged grounded power outlets can be used for VAD equipment. Deadrian confirmed home has electrical outlets that will support the equipment.    Identified the following lifestyle modifications while living on MCS:    1. No driving for at least three months and then only if doctor gives permission to do so.   2. No tub baths while pump implanted, and shower only when doctor gives permission.   3. No swimming or submersion in water while implanted with pump.   4. No contact sports or engaging in jumping activities.   5. Always have a backup controller, charged spare batteries, and battery clips nearby at all times in case of emergency.   6. Call the doctor or hospital contact person if any change in how the pump sounds, feels, or works.   7. Plan to sleep only when connected to the power module.   8. Do not sleep on your stomach.   9. Keep a backup system controller, charged batteries, battery clips, and flashlight near you during sleep in case of electrical power outage.   10. Exit site care including dressing changes, monitoring for infection, and importance of keeping percutaneous lead stabilized at all times.    Patient states that his father has an LVAD, but that he does not have a good relationship with him. He said he would attempt to talk with his father about living life with an LVAD.  Reviewed pictures of VAD drive line, site care, dressing changes, and drive line stabilization including securement  attachment device and abdominal binder. Discussed with pt and family that they will be required to purchase dressing supplies as long as patient has the VAD in place.   Intermacs patient survival statistics through December 2019 reviewed with patient and caregiver as follows:                                               The patient understands that from this discussion it does not mean that they will receive the device, but that depends on an extensive evaluation process. The patient is  aware of the fact that if at anytime they want to stop the evaluation process they can.  All questions have been answered at this time and contact information was provided should they encounter any further questions.  Patient is agreeable at this time to the evaluation process and will move forward.   Emerson Monte RN [place] Coordinator  Office: [phone]  24/7 Pager: [phone]

## 2019-04-17 ENCOUNTER — Ambulatory Visit (INDEPENDENT_AMBULATORY_CARE_PROVIDER_SITE_OTHER): Payer: Medicaid Other | Admitting: *Deleted

## 2019-04-17 ENCOUNTER — Telehealth (HOSPITAL_COMMUNITY): Payer: Self-pay | Admitting: Licensed Clinical Social Worker

## 2019-04-17 ENCOUNTER — Other Ambulatory Visit: Payer: Self-pay

## 2019-04-17 DIAGNOSIS — I639 Cerebral infarction, unspecified: Secondary | ICD-10-CM

## 2019-04-17 DIAGNOSIS — I513 Intracardiac thrombosis, not elsewhere classified: Secondary | ICD-10-CM

## 2019-04-17 DIAGNOSIS — Z5181 Encounter for therapeutic drug level monitoring: Secondary | ICD-10-CM

## 2019-04-17 LAB — POCT INR: INR: 1.6 — AB (ref 2.0–3.0)

## 2019-04-17 NOTE — Telephone Encounter (Signed)
CSW called taxi for pt to get to 2pm coumadin appt  CSW will continue to follow and assist as needed  Jorge Ny, [place] Worker [facility] Desk#: [phone] Cell#: [phone]

## 2019-04-17 NOTE — Patient Instructions (Signed)
Description   Take 1.5 tablets today and tomorrow then continue on same dosage 1 tablet daily except  1/2 tablet on Tuesdays. Please adhere to appointments. Recheck INR in 1 week. Coumadin Clinic#[phone] Main [phone]

## 2019-04-18 LAB — FACTOR 5 LEIDEN

## 2019-04-19 ENCOUNTER — Other Ambulatory Visit (HOSPITAL_COMMUNITY): Payer: Self-pay

## 2019-04-19 ENCOUNTER — Inpatient Hospital Stay (HOSPITAL_COMMUNITY): Admission: RE | Admit: 2019-04-19 | Payer: Medicaid Other | Source: Ambulatory Visit

## 2019-04-19 ENCOUNTER — Telehealth (HOSPITAL_COMMUNITY): Payer: Self-pay | Admitting: Licensed Clinical Social Worker

## 2019-04-19 NOTE — Progress Notes (Signed)
I picked up Mark [name] meds from [place] and dropped them off to his house.  Inhaler and digoxin.

## 2019-04-19 NOTE — Telephone Encounter (Signed)
Transport set up for pt apt today at 2pm  CSW will continue to follow and assist as needed  Jorge Ny, [facility] Desk#: [phone] Cell#: [phone]

## 2019-04-24 ENCOUNTER — Ambulatory Visit (INDEPENDENT_AMBULATORY_CARE_PROVIDER_SITE_OTHER): Payer: Medicaid Other | Admitting: Pharmacist

## 2019-04-24 ENCOUNTER — Telehealth (HOSPITAL_COMMUNITY): Payer: Self-pay | Admitting: Licensed Clinical Social Worker

## 2019-04-24 ENCOUNTER — Other Ambulatory Visit: Payer: Self-pay

## 2019-04-24 DIAGNOSIS — Z5181 Encounter for therapeutic drug level monitoring: Secondary | ICD-10-CM

## 2019-04-24 DIAGNOSIS — I513 Intracardiac thrombosis, not elsewhere classified: Secondary | ICD-10-CM

## 2019-04-24 DIAGNOSIS — I639 Cerebral infarction, unspecified: Secondary | ICD-10-CM

## 2019-04-24 LAB — POCT INR: INR: 3.5 — AB (ref 2.0–3.0)

## 2019-04-24 NOTE — Patient Instructions (Signed)
Description   Skip your Coumadin today, then continue on same dosage 1 tablet daily except 1/2 tablet on Tuesdays. Please adhere to appointments. Recheck INR in 10 days. Coumadin Clinic#[phone] Main [phone]

## 2019-04-24 NOTE — Telephone Encounter (Signed)
Pt without transport to coumadin appt today- taxi called to take pt to appt.  CSW will continue to follow and assist as needed  Jorge Ny, [facility] Desk#: [phone] Cell#: [phone]

## 2019-04-25 MED FILL — SPIRONOLACTONE 25 MG TABLET: 25 | 30 days supply | Qty: 30 | Fill #0

## 2019-04-26 ENCOUNTER — Other Ambulatory Visit: Payer: Self-pay | Admitting: Pharmacist

## 2019-04-26 DIAGNOSIS — J449 Chronic obstructive pulmonary disease, unspecified: Secondary | ICD-10-CM

## 2019-04-26 MED ORDER — ALBUTEROL SULFATE HFA 108 (90 BASE) MCG/ACT IN AERS: 1.0000 | INHALATION_SPRAY | RESPIRATORY_TRACT | 2 refills | Status: DC | PRN

## 2019-04-26 MED FILL — ALBUTEROL SULFATE HFA 108 (: 108 (90 BAS | 16 days supply | Qty: 9 | Fill #0

## 2019-04-30 ENCOUNTER — Telehealth (HOSPITAL_COMMUNITY): Payer: Self-pay

## 2019-04-30 ENCOUNTER — Other Ambulatory Visit (HOSPITAL_COMMUNITY): Payer: Self-pay

## 2019-04-30 ENCOUNTER — Encounter (HOSPITAL_COMMUNITY): Payer: Medicaid Other

## 2019-04-30 NOTE — Progress Notes (Signed)
I stopped by Mark [name] house to remind him of his appointment today.  He said that he didn't know that he had an appointment scheduled and he has to go to the doctor with his wife.  I told him to call the Advance heart failure clinic and reschedule.

## 2019-04-30 NOTE — Telephone Encounter (Signed)
PT canacelled appt.  Dee w/ paramedicine has asked me to contact PT to reschedule appt.  1st attempt to contact PT has been made.

## 2019-05-06 ENCOUNTER — Telehealth: Payer: Self-pay | Admitting: *Deleted

## 2019-05-06 ENCOUNTER — Telehealth (HOSPITAL_COMMUNITY): Payer: Self-pay | Admitting: Licensed Clinical Social Worker

## 2019-05-06 NOTE — Telephone Encounter (Addendum)
Pt had requested transport to coumadin appt today so CSW set up taxi to pick up patient.  Taxi company called and informed CSW that pt did not come out for taxi- CSW attempted to call pt and was unable to reach.   CSW will continue to follow and assist as needed  Jorge Ny, [facility] Desk#: [phone] Cell#: [phone]

## 2019-05-06 NOTE — Telephone Encounter (Signed)
Called pt because he is missed his appt today after the Social Worker Eliezer Lofts arranged him a Taxi to come to his appt today, unable to reach the pt so left a message for the pt to call back and left the [facility]tico[facility] Clinic at [phone] to reschedule.

## 2019-05-07 ENCOUNTER — Telehealth (HOSPITAL_COMMUNITY): Payer: Self-pay

## 2019-05-07 NOTE — Telephone Encounter (Signed)
I called Mr. Jindra to schedule [facility] visit for this week.  I left a message for him to call me back.

## 2019-05-08 ENCOUNTER — Inpatient Hospital Stay (HOSPITAL_COMMUNITY): Payer: Medicaid Other

## 2019-05-08 ENCOUNTER — Inpatient Hospital Stay (HOSPITAL_COMMUNITY)
Admission: EM | Admit: 2019-05-08 | Discharge: 2019-05-13 | DRG: 291 | Disposition: A | Discharge status: Home / Self Care | Payer: Medicaid Other | Attending: Cardiology | Admitting: Cardiology

## 2019-05-08 ENCOUNTER — Emergency Department (HOSPITAL_COMMUNITY): Payer: Medicaid Other

## 2019-05-08 ENCOUNTER — Other Ambulatory Visit: Payer: Self-pay

## 2019-05-08 ENCOUNTER — Telehealth (HOSPITAL_COMMUNITY): Payer: Self-pay | Admitting: Licensed Clinical Social Worker

## 2019-05-08 DIAGNOSIS — E872 Acidosis: Secondary | ICD-10-CM | POA: Diagnosis present

## 2019-05-08 DIAGNOSIS — Z880 Allergy status to penicillin: Secondary | ICD-10-CM | POA: Diagnosis not present

## 2019-05-08 DIAGNOSIS — E876 Hypokalemia: Secondary | ICD-10-CM | POA: Diagnosis present

## 2019-05-08 DIAGNOSIS — I428 Other cardiomyopathies: Secondary | ICD-10-CM | POA: Diagnosis present

## 2019-05-08 DIAGNOSIS — Z7901 Long term (current) use of anticoagulants: Secondary | ICD-10-CM | POA: Diagnosis not present

## 2019-05-08 DIAGNOSIS — Z7982 Long term (current) use of aspirin: Secondary | ICD-10-CM | POA: Diagnosis not present

## 2019-05-08 DIAGNOSIS — Z881 Allergy status to other antibiotic agents status: Secondary | ICD-10-CM

## 2019-05-08 DIAGNOSIS — Z87891 Personal history of nicotine dependence: Secondary | ICD-10-CM

## 2019-05-08 DIAGNOSIS — Z515 Encounter for palliative care: Secondary | ICD-10-CM | POA: Diagnosis not present

## 2019-05-08 DIAGNOSIS — Z79899 Other long term (current) drug therapy: Secondary | ICD-10-CM

## 2019-05-08 DIAGNOSIS — R945 Abnormal results of liver function studies: Secondary | ICD-10-CM | POA: Diagnosis not present

## 2019-05-08 DIAGNOSIS — I5043 Acute on chronic combined systolic (congestive) and diastolic (congestive) heart failure: Secondary | ICD-10-CM | POA: Diagnosis not present

## 2019-05-08 DIAGNOSIS — I5023 Acute on chronic systolic (congestive) heart failure: Principal | ICD-10-CM | POA: Diagnosis present

## 2019-05-08 DIAGNOSIS — R0981 Nasal congestion: Secondary | ICD-10-CM | POA: Diagnosis present

## 2019-05-08 DIAGNOSIS — R000 Tachycardia, unspecified: Secondary | ICD-10-CM | POA: Diagnosis present

## 2019-05-08 DIAGNOSIS — Z885 Allergy status to narcotic agent status: Secondary | ICD-10-CM | POA: Diagnosis not present

## 2019-05-08 DIAGNOSIS — I5021 Acute systolic (congestive) heart failure: Secondary | ICD-10-CM | POA: Diagnosis not present

## 2019-05-08 DIAGNOSIS — J449 Chronic obstructive pulmonary disease, unspecified: Secondary | ICD-10-CM | POA: Diagnosis present

## 2019-05-08 DIAGNOSIS — Z8673 Personal history of transient ischemic attack (TIA), and cerebral infarction without residual deficits: Secondary | ICD-10-CM | POA: Diagnosis not present

## 2019-05-08 DIAGNOSIS — I513 Intracardiac thrombosis, not elsewhere classified: Secondary | ICD-10-CM | POA: Diagnosis present

## 2019-05-08 DIAGNOSIS — R570 Cardiogenic shock: Secondary | ICD-10-CM

## 2019-05-08 DIAGNOSIS — R0602 Shortness of breath: Secondary | ICD-10-CM

## 2019-05-08 DIAGNOSIS — I5082 Biventricular heart failure: Secondary | ICD-10-CM | POA: Diagnosis not present

## 2019-05-08 DIAGNOSIS — Z20828 Contact with and (suspected) exposure to other viral communicable diseases: Secondary | ICD-10-CM | POA: Diagnosis present

## 2019-05-08 DIAGNOSIS — F1410 Cocaine abuse, uncomplicated: Secondary | ICD-10-CM | POA: Diagnosis present

## 2019-05-08 DIAGNOSIS — Z8249 Family history of ischemic heart disease and other diseases of the circulatory system: Secondary | ICD-10-CM

## 2019-05-08 DIAGNOSIS — Z888 Allergy status to other drugs, medicaments and biological substances status: Secondary | ICD-10-CM

## 2019-05-08 DIAGNOSIS — R7989 Other specified abnormal findings of blood chemistry: Secondary | ICD-10-CM

## 2019-05-08 DIAGNOSIS — Z7189 Other specified counseling: Secondary | ICD-10-CM | POA: Diagnosis not present

## 2019-05-08 DIAGNOSIS — N182 Chronic kidney disease, stage 2 (mild): Secondary | ICD-10-CM | POA: Diagnosis present

## 2019-05-08 DIAGNOSIS — Z91138 Patient's unintentional underdosing of medication regimen for other reason: Secondary | ICD-10-CM

## 2019-05-08 DIAGNOSIS — K0889 Other specified disorders of teeth and supporting structures: Secondary | ICD-10-CM | POA: Diagnosis present

## 2019-05-08 LAB — POCT I-STAT EG7
Acid-Base Excess: 2 mmol/L (ref 0.0–2.0)
Bicarbonate: 24.6 mmol/L (ref 20.0–28.0)
Calcium, Ion: 1.04 mmol/L — ABNORMAL LOW (ref 1.15–1.40)
HCT: 42 % (ref 39.0–52.0)
Hemoglobin: 14.3 g/dL (ref 13.0–17.0)
O2 Saturation: 99 %
Potassium: 4.1 mmol/L (ref 3.5–5.1)
Sodium: 133 mmol/L — ABNORMAL LOW (ref 135–145)
TCO2: 25 mmol/L (ref 22–32)
pCO2, Ven: 30.4 mmHg — ABNORMAL LOW (ref 44.0–60.0)
pH, Ven: 7.516 — ABNORMAL HIGH (ref 7.250–7.430)
pO2, Ven: 104 mmHg — ABNORMAL HIGH (ref 32.0–45.0)

## 2019-05-08 LAB — COMPREHENSIVE METABOLIC PANEL
ALT: 147 U/L — ABNORMAL HIGH (ref 0–44)
AST: 131 U/L — ABNORMAL HIGH (ref 15–41)
Albumin: 3.2 g/dL — ABNORMAL LOW (ref 3.5–5.0)
Alkaline Phosphatase: 138 U/L — ABNORMAL HIGH (ref 38–126)
Anion gap: 13 (ref 5–15)
BUN: 27 mg/dL — ABNORMAL HIGH (ref 6–20)
CO2: 21 mmol/L — ABNORMAL LOW (ref 22–32)
Calcium: 9 mg/dL (ref 8.9–10.3)
Chloride: 98 mmol/L (ref 98–111)
Creatinine, Ser: 1.19 mg/dL (ref 0.61–1.24)
GFR calc Af Amer: >60 mL/min (ref >60)
GFR calc non Af Amer: >60 mL/min (ref >60)
Glucose, Bld: 91 mg/dL (ref 70–99)
Potassium: 4 mmol/L (ref 3.5–5.1)
Sodium: 132 mmol/L — ABNORMAL LOW (ref 135–145)
Total Bilirubin: 3.2 mg/dL — ABNORMAL HIGH (ref 0.3–1.2)
Total Protein: 7.2 g/dL (ref 6.5–8.1)

## 2019-05-08 LAB — CBC
HCT: 37.3 % — ABNORMAL LOW (ref 39.0–52.0)
Hemoglobin: 12.1 g/dL — ABNORMAL LOW (ref 13.0–17.0)
MCH: 26.4 pg (ref 26.0–34.0)
MCHC: 32.4 g/dL (ref 30.0–36.0)
MCV: 81.3 fL (ref 80.0–100.0)
Platelets: 186 10*3/uL (ref 150–400)
RBC: 4.59 MIL/uL (ref 4.22–5.81)
RDW: 17 % — ABNORMAL HIGH (ref 11.5–15.5)
WBC: 3.8 10*3/uL — ABNORMAL LOW (ref 4.0–10.5)
nRBC: 0.8 % — ABNORMAL HIGH (ref 0.0–0.2)

## 2019-05-08 LAB — SARS CORONAVIRUS 2 BY RT PCR (HOSPITAL ORDER, PERFORMED IN ~~LOC~~ HOSPITAL LAB): SARS Coronavirus 2: NEGATIVE

## 2019-05-08 LAB — LACTIC ACID, PLASMA: Lactic Acid, Venous: 4.1 mmol/L (ref 0.5–1.9)

## 2019-05-08 LAB — TROPONIN I (HIGH SENSITIVITY): Troponin I (High Sensitivity): 39 ng/L — ABNORMAL HIGH (ref <18)

## 2019-05-08 LAB — BRAIN NATRIURETIC PEPTIDE: B Natriuretic Peptide: 3017.6 pg/mL — ABNORMAL HIGH (ref 0.0–100.0)

## 2019-05-08 MED ORDER — MILRINONE LACTATE IN DEXTROSE 20-5 MG/100ML-% IV SOLN
0.2500 ug/kg/min | INTRAVENOUS | Status: DC
  Administered 2019-05-08 – 2019-05-11 (×4): 0.375 ug/kg/min via INTRAVENOUS
  Administered 2019-05-11: 0.25 ug/kg/min via INTRAVENOUS
  Administered 2019-05-12 – 2019-05-13 (×2): 0.375 ug/kg/min via INTRAVENOUS
  Filled 2019-05-08 (×8): qty 100

## 2019-05-08 MED ORDER — FUROSEMIDE 10 MG/ML IJ SOLN
80.0000 mg | Freq: Once | INTRAMUSCULAR | Status: AC
  Administered 2019-05-08: 80 mg via INTRAVENOUS
  Filled 2019-05-08: qty 8

## 2019-05-08 NOTE — ED Provider Notes (Signed)
[facility] Emergency Department Provider Note MRN:  [id number]  Arrival date & time: 05/08/19     Chief Complaint   Shortness of Breath and Chest Pain   History of Present Illness   Mark [name] is a 43 y.o. year-old male with a history of nonischemic cardiomyopathy (ejection fraction 10%) presenting to the ED with chief complaint of shortness of breath and chest pain.  2 days of worsening shortness of breath, dyspnea on exertion, leg swelling.  Also endorsing pressure in the chest.  Explains that he had a recent admission to the cardiology service 2 weeks ago.  Denies fever.  Endorsing increased cough for the past few days.  Denies abdominal pain, no numbness or weakness to the arms or legs.  Symptoms constant, moderate in severity, no other exacerbating or alleviating factors.  Review of Systems  A complete 10 system review of systems was obtained and all systems are negative except as noted in the HPI and PMH.   Patient's Health History    Past Medical History:  Diagnosis Date   Asthma    Chronic systolic CHF (congestive heart failure) (HCC)    Cigarette smoker    CKD (chronic kidney disease), stage II    /notes 10/01/2017   COPD (chronic obstructive pulmonary disease) (HCC) 10/21/2017   on CT scan chest   History of echocardiogram    a. Echo 5/17 - EF 20-25%, severe diff HK, restrictive physiology, mild to mod MR, severe reduced RVSF, mod RVE, mild RAE, mod TR, PASP 48 mmHg   Hx of cardiac cath    a. LHC 5/17 - normal coronary arteries. PA 45/25, mean 33, PCWP mean 18   NICM (nonischemic cardiomyopathy) (HCC)    Stroke (HCC) 09/27/2017   "was weak on my left side; I'm fully recovered" (11/09/2017)   Substance abuse (HCC)    cocaine, marijuana    Past Surgical History:  Procedure Laterality Date   CARDIAC CATHETERIZATION N/A 01/05/2016   Procedure: Right/Left Heart Cath and Coronary Angiography;  Surgeon: Lennette Biharihomas A Kelly, MD;  Location:  [facility] INVASIVE CV LAB;  Service: Cardiovascular;  Laterality: N/A;   MULTIPLE EXTRACTIONS WITH ALVEOLOPLASTY Bilateral 04/11/2019   Procedure: Extraction of tooth #'s 2, 4, 5, 10, and 12 with alveoloplasty;  Surgeon: Charlynne PanderKulinski, Ronald F, DDS;  Location: [facility] OR;  Service: Oral Surgery;  Laterality: Bilateral;   RIGHT HEART CATH N/A 04/09/2019   Procedure: RIGHT HEART CATH;  Surgeon: Dolores PattyBensimhon, Daniel R, MD;  Location: [facility] INVASIVE CV LAB;  Service: Cardiovascular;  Laterality: N/A;    Family History  Problem Relation Age of Onset   Cardiomyopathy Father        Reports his father has an LVAD   Heart failure Father    Hypertension Father    Heart disease Maternal Grandmother        had a whole in her heart   Deep vein thrombosis Neg Hx     Social History   Socioeconomic History   Marital status: Divorced    Spouse name: Not on file   Number of children: Not on file   Years of education: Not on file   Highest education level: Not on file  Occupational History   Not on file  Social Needs   Financial resource strain: Not on file   Food insecurity    Worry: Not on file    Inability: Not on file   Transportation needs    Medical: Not on file  Non-medical: Not on file  Tobacco Use   Smoking status: Former Smoker    Packs/day: 1.00    Years: 30.00    Pack years: 30.00    Types: Cigarettes    Quit date: 09/27/2017    Years since quitting: 1.6   Smokeless tobacco: Never Used  Substance and Sexual Activity   Alcohol use: Yes    Alcohol/week: 5.0 standard drinks    Types: 5 Shots of liquor per week    Frequency: Never    Comment: 5-6 shots of vodka daily; "stopped it all after I had stroke 09/27/2017"   Drug use: Yes    Types: Cocaine, Marijuana    Comment: 11/09/2017 "none since 09/27/2017"   Sexual activity: Not Currently  Lifestyle   Physical activity    Days per week: Not on file    Minutes per session: Not on file   Stress: Not on file  Relationships    Social connections    Talks on phone: Not on file    Gets together: Not on file    Attends religious service: Not on file    Active member of club or organization: Not on file    Attends meetings of clubs or organizations: Not on file    Relationship status: Not on file   Intimate partner violence    Fear of current or ex partner: Not on file    Emotionally abused: Not on file    Physically abused: Not on file    Forced sexual activity: Not on file  Other Topics Concern   Not on file  Social History Narrative   Not on file     Physical Exam  Vital Signs and Nursing Notes reviewed Vitals:   05/08/19 2115 05/08/19 2200  BP: (!) 110/99 (!) 121/92  Pulse: (!) 54 (!) 109  Resp: (!) 23 20  Temp:    SpO2: 100% 99%    CONSTITUTIONAL: Chronically ill-appearing, NAD NEURO:  Alert and oriented x 3, no focal deficits EYES:  eyes equal and reactive ENT/NECK:  no LAD, no JVD CARDIO: Tachycardic rate, well-perfused, normal S1 and S2 PULM:  CTAB no wheezing or rhonchi GI/GU:  normal bowel sounds, non-distended, non-tender MSK/SPINE:  No gross deformities, 2+ pitting edema to bilateral lower extremities. SKIN:  no rash, atraumatic PSYCH:  Appropriate speech and behavior  Diagnostic and Interventional Summary    EKG Interpretation  Date/Time:  Wednesday May 08 2019 19:36:23 EDT Ventricular Rate:  113 PR Interval:    QRS Duration: 115 QT Interval:  338 QTC Calculation: 464 R Axis:   -112 Text Interpretation:  Sinus tachycardia Probable left atrial enlargement Incomplete RBBB and LAFB Anterior infarct, old Minimal ST elevation, lateral leads Confirmed by Kennis CarinaBero, Lowen Mansouri [id number]) on 05/08/2019 8:33:34 PM      Labs Reviewed  BRAIN NATRIURETIC PEPTIDE - Abnormal; Notable for the following components:      Result Value   B Natriuretic Peptide 3,017.6 (*)    All other components within normal limits  CBC - Abnormal; Notable for the following components:   WBC 3.8 (*)     Hemoglobin 12.1 (*)    HCT 37.3 (*)    RDW 17.0 (*)    nRBC 0.8 (*)    All other components within normal limits  COMPREHENSIVE METABOLIC PANEL - Abnormal; Notable for the following components:   Sodium 132 (*)    CO2 21 (*)    BUN 27 (*)    Albumin 3.2 (*)  AST 131 (*)    ALT 147 (*)    Alkaline Phosphatase 138 (*)    Total Bilirubin 3.2 (*)    All other components within normal limits  LACTIC ACID, PLASMA - Abnormal; Notable for the following components:   Lactic Acid, Venous 4.1 (*)    All other components within normal limits  POCT I-STAT EG7 - Abnormal; Notable for the following components:   pH, Ven 7.516 (*)    pCO2, Ven 30.4 (*)    pO2, Ven 104.0 (*)    Sodium 133 (*)    Calcium, Ion 1.04 (*)    All other components within normal limits  TROPONIN I (HIGH SENSITIVITY) - Abnormal; Notable for the following components:   Troponin I (High Sensitivity) 39 (*)    All other components within normal limits  SARS CORONAVIRUS 2 (HOSPITAL ORDER, PERFORMED IN [facility])  RAPID URINE DRUG SCREEN, HOSP PERFORMED  COOXEMETRY PANEL  COOXEMETRY PANEL  TROPONIN I (HIGH SENSITIVITY)    DG Chest Port 1 View  Final Result      Medications  milrinone (PRIMACOR) 20 MG/100 ML (0.2 mg/mL) infusion (has no administration in time range)  furosemide (LASIX) injection 80 mg (80 mg Intravenous Given 05/08/19 2051)     Procedures Critical Care Critical Care Documentation Critical care time provided by me (excluding procedures): 34 minutes  Condition necessitating critical care: Cardiogenic shock  Components of critical care management: reviewing of prior records, laboratory and imaging interpretation, frequent re-examination and reassessment of vital signs, administration of IV Lasix, IV milrinone, discussion with consulting services.    ED Course and Medical Decision Making  I have reviewed the triage vital signs and the nursing notes.  Pertinent labs & imaging  results that were available during my care of the patient were reviewed by me and considered in my medical decision making (see below for details).  Severe cardiomyopathy patient, was in the hospital 2 weeks ago for dental extraction and during that hospitalization went into cardiogenic shock 2 times.  Received milrinone drip during these exacerbations.  Patient is tachypneic with increased work of breathing, he has pitting edema.  He is requesting BiPAP for comfort.  Discussed case with Dr. Sherlie Ban of cardiology, who is recommending Lasix and symptomatic management to start prior to any milrinone infusion.  Blood pressure between 110 and 120 systolic, appears well perfused at this time.  10 PM update: Upon reassessment patient continues to have mild respiratory distress, his feet are cool to the touch, his lactate is 4.  There is concern for poor perfusion and/or cardiogenic shock.  Discussed again with Dr. Jearld Pies of cardiology.  Recommending IV milrinone, will admit to stepdown under cardiology.  Patient not interested in central line placement at this time, will proceed with peripheral milrinone.  RT is in the room now to evaluate for high flow nasal cannula versus noninvasive positive pressure ventilation.  There has been a delay in patient receiving CPAP/BiPAP due to his pending coronavirus test.  Patient does have leukopenia and elevated LFTs, making coronavirus a possibility.  Elmer Sow. Pilar Plate, MD [facility] Emergency Medicine [facility] [email or web address]  Final Clinical Impressions(s) / ED Diagnoses     ICD-10-CM   1. Cardiogenic shock (HCC)  R57.0   2. SOB (shortness of breath)  R06.02 DG Chest [facility] 1 View    DG Chest Port 1 View    ED Discharge Orders    None      Discharge Instructions  Discussed with and Provided to Patient: Discharge Instructions   None       Maudie Flakes, MD 05/08/19 2227

## 2019-05-08 NOTE — Progress Notes (Signed)
Called to room to place patient on BiPap for SOB due to CHF.  RR 22, sats 98% on room air.  Patient complains of stuffy nose, upper airway secretions with productive cough.  Due to mild respiratory distress and lack of negative COVID test, patient tried on high flow salter at 10LPM until test results recieved.  Will monitor and apply BiPap if the patient does not get relief.

## 2019-05-08 NOTE — ED Triage Notes (Addendum)
  Patient BIB EMS for SOB that has been going on for 3 days.  Patient has hx of asthma and CHF.  Has had worsening SOB and bilateral lower leg edema for the past few days.  Patient states it is hard to breathe and tight in his chest. Productive cough.  Pain 8/10.  Patient A&O x4.  Patient had cardiac cath done two weeks ago but is unable to say why and how many stents placed.

## 2019-05-08 NOTE — Telephone Encounter (Signed)
CSW had texted patient yesterday to check in since patient had missed coumadin appt on Monday and had not been responsive.  Patient stated things weren't going well but then did not respond after that.  CSW called this morning to check in.  Pt answered and stated that he was not feeling well.  States that an infection in his nose came back and made his whole face swell- he thinks this might be due to eye drops that he started- he stopped these drops and said the swelling has been going down.  Patient also states his ankles are swollen and he has been somewhat short of breath but states he doesn't think his weight is up.  Pt agreeable to having paramedic come out this afternoon to check in on him- CSW sent message to paramedic to inform and have them reach out to set up time.  CSW will continue to follow and assist as needed  Jorge Ny, [facility] Desk#: [phone] Cell#: [phone]

## 2019-05-08 NOTE — H&P (Signed)
Advanced Heart Failure Team History and Physical Note   PCP:  Grayce SessionsEdwards, Michelle P, NP  PCP-Cardiology: Verne Carrowhristopher McAlhany, MD     Reason for Admission: CHF   HPI:    Wright C Hayesis a 43 y.o.malewith a history ofchronic systolic CHF (EF 16-10%10-15% with severe diffuse hypokinesis5/2020,NICM by cardiac cath 2017, history of right MCA CVA 2019, asthma, andpolysubstance use.  In 2017 he was diagnosed with new onset acute systolic heart failure. Cath at that time showed normal coronaries. He was started on HF meds. Unfortunately he was incarcerated 14 months until late 2018.   Admitted 12/2018 with increased dyspnea. He had run out of medications. ECHO completed 12/2018 showed severely reduced LV/RVEF 5-10% with large apical thrombus and grade II DD. At that time he refused to go on coumadin.   [facility] with increased dyspnea. UDS was + for cocaine. Diuresed with IV lasix. He had run out of his HF medications. He was restarted back on HF medications.  Admitted  02/13/19 through 02/18/19 with A/C systolic heart failure. He also had dental pain and was placed on antibiotics. Dr Kristin BruinsKulinski consulted, planned for multiple teeth extractions with general anesthesia.  Seen in clinic for pre-op eval and felt to be NYHA IIIb and very high risk for surgery. Set up for RHC prior to surgery.   RHC in 8/20 showed evidence of low output and volume overload. Admitted to ICU for inotropic support and IV diuresis.  He was started on milrinone gtt and diuresed with Lasix.  He had his teeth extractions.  On 04/13/19, he insisted on going home for a family funeral.   He returned to the ER today with dyspnea.  He says that he was doing fine initially, but felt like he had something up his nose that was making it hard to breathe.  He put rubbing alcohol up his nose, he says that this irritated his nose and made him even more short of breath, so he came to the ER.  He says that he really just  wants something for his nose and does not want to be admitted.  He is tachypneic and orthopneic.  He is mildly short of breath while talking.  CXR showed cardiomegaly without overt edema.  ECG showed sinus tachycardia.  Creatinine stable but lactate elevated at 4.1.  SBP in 120s.  HS-TNI 39.  BNP 3017.   Review of Systems: All systems reviewed and negative except as per HPI.   Home Medications Prior to Admission medications   Medication Sig Start Date End Date Taking? Authorizing Provider  acetaminophen (TYLENOL) 500 MG tablet Take 1,000 mg by mouth every 6 (six) hours as needed for moderate pain.   Yes [provider]  albuterol (VENTOLIN HFA) 108 (90 Base) MCG/ACT inhaler Inhale 1-2 puffs into the lungs every 4 (four) hours as needed for wheezing or shortness of breath. 04/26/19  Yes Hoy RegisterNewlin, Enobong, MD  aspirin 81 MG EC tablet Take 1 tablet (81 mg total) by mouth daily. 12/30/18  Yes Sheikh, Omair Latif, DO  budesonide-formoterol (SYMBICORT) 80-4.5 MCG/ACT inhaler Inhale 2 puffs into the lungs 2 (two) times daily. 04/13/19  Yes Creig HinesBerge, Christopher Ronald, NP  digoxin (LANOXIN) 0.125 MG tablet Take 1 tablet (0.125 mg total) by mouth daily. 12/30/18  Yes Sheikh, Omair Latif, DO  ferrous sulfate 325 (65 FE) MG EC tablet Take 325 mg by mouth daily.   Yes [provider]  furosemide (LASIX) 80 MG tablet Take 1 tablet (80  mg total) by mouth daily. 02/26/19  Yes Bensimhon, Bevelyn Bucklesaniel R, MD  Multiple Vitamin (MULTIVITAMIN WITH MINERALS) TABS tablet Take 1 tablet by mouth daily.   Yes [provider]  PAZEO 0.7 % SOLN Place 1 drop into both eyes every morning. 04/17/19  Yes [provider]  potassium chloride SA (K-DUR) 20 MEQ tablet Take 1 tablet (20 mEq total) by mouth daily. Take 40meq with Metolazone. 03/08/19  Yes Bensimhon, Bevelyn Bucklesaniel R, MD  spironolactone (ALDACTONE) 25 MG tablet Take 1 tablet (25 mg total) by mouth daily. 03/11/19 05/10/19 Yes Clegg, Amy D, NP  warfarin (COUMADIN)  5 MG tablet Take 1 tablet (5 mg total) by mouth daily at 6 PM. 04/13/19  Yes Creig HinesBerge, Christopher Ronald, NP    Past Medical History: Past Medical History:  Diagnosis Date  . Asthma   . Chronic systolic CHF (congestive heart failure) (HCC)   . Cigarette smoker   . CKD (chronic kidney disease), stage II    [name]/notes 10/01/2017  . COPD (chronic obstructive pulmonary disease) (HCC) 10/21/2017   on CT scan chest  . History of echocardiogram    a. Echo 5/17 - EF 20-25%, severe diff HK, restrictive physiology, mild to mod MR, severe reduced RVSF, mod RVE, mild RAE, mod TR, PASP 48 mmHg  . Hx of cardiac cath    a. LHC 5/17 - normal coronary arteries. PA 45/25, mean 33, PCWP mean 18  . NICM (nonischemic cardiomyopathy) (HCC)   . Stroke (HCC) 09/27/2017   "was weak on my left side; I'm fully recovered" (11/09/2017)  . Substance abuse (HCC)    cocaine, marijuana    Past Surgical History: Past Surgical History:  Procedure Laterality Date  . CARDIAC CATHETERIZATION N/A 01/05/2016   Procedure: Right/Left Heart Cath and Coronary Angiography;  Surgeon: Lennette Biharihomas A Kelly, MD;  [facility];  Service: Cardiovascular;  Laterality: N/A;  . MULTIPLE EXTRACTIONS WITH ALVEOLOPLASTY Bilateral 04/11/2019   Procedure: Extraction of tooth #'s 2, 4, 5, 10, and 12 with alveoloplasty;  Surgeon: Charlynne PanderKulinski, Ronald F, DDS;  Location: [facility] OR;  Service: Oral Surgery;  Laterality: Bilateral;  . RIGHT HEART CATH N/A 04/09/2019   Procedure: RIGHT HEART CATH;  Surgeon: Dolores PattyBensimhon, Daniel R, MD;  [facility];  Service: Cardiovascular;  Laterality: N/A;    Family History:  Family History  Problem Relation Age of Onset  . Cardiomyopathy Father        Reports his father has an LVAD  . Heart failure Father   . Hypertension Father   . Heart disease Maternal Grandmother        had a whole in her heart  . Deep vein thrombosis Neg Hx     Social History: Social History   Socioeconomic History  .  Marital status: Divorced    Spouse name: Not on file  . Number of children: Not on file  . Years of education: Not on file  . Highest education level: Not on file  Occupational History  . Not on file  Social Needs  . Financial resource strain: Not on file  . Food insecurity    Worry: Not on file    Inability: Not on file  . Transportation needs    Medical: Not on file    Non-medical: Not on file  Tobacco Use  . Smoking status: Former Smoker    Packs/day: 1.00    Years: 30.00    Pack years: 30.00    Types: Cigarettes  Quit date: 09/27/2017    Years since quitting: 1.6  . Smokeless tobacco: Never Used  Substance and Sexual Activity  . Alcohol use: Yes    Alcohol/week: 5.0 standard drinks    Types: 5 Shots of liquor per week    Frequency: Never    Comment: 5-6 shots of vodka daily; "stopped it all after I had stroke 09/27/2017"  . Drug use: Yes    Types: Cocaine, Marijuana    Comment: 11/09/2017 "none since 09/27/2017"  . Sexual activity: Not Currently  Lifestyle  . Physical activity    Days per week: Not on file    Minutes per session: Not on file  . Stress: Not on file  Relationships  . Social Musician on phone: Not on file    Gets together: Not on file    Attends religious service: Not on file    Active member of club or organization: Not on file    Attends meetings of clubs or organizations: Not on file    Relationship status: Not on file  Other Topics Concern  . Not on file  Social History Narrative  . Not on file    Allergies:  Allergies  Allergen Reactions  . Clindamycin/Lincomycin Swelling and Palpitations  . Hydrocodone Hives  . Lisinopril Swelling and Other (See Comments)    Facial swelling/angioedema  . Prednisone Shortness Of Breath, Nausea Only, Swelling and Other (See Comments)    Also made chest feel tight and genital area, legs, and face became swollen badly  . Bactrim [Sulfamethoxazole-Trimethoprim] Other (See Comments)    unknown   . Penicillins Hives and Swelling     Has patient had a PCN reaction causing immediate rash, facial/tongue/throat swelling, SOB or lightheadedness with hypotension: Yes Has patient had a PCN reaction causing severe rash involving mucus membranes or skin necrosis: No Has patient had a PCN reaction that required hospitalization: No Has patient had a PCN reaction occurring within the last 10 years: No If all of the above answers are "NO", then may proceed with Cephalosporin use.     Objective:    Vital Signs:   Temp:  [97.4 F (36.3 C)] 97.4 F (36.3 C) (09/16 1935) Pulse Rate:  [52-113] 109 (09/16 2200) Resp:  [20-25] 20 (09/16 2200) BP: (110-121)/(80-99) 121/92 (09/16 2200) SpO2:  [98 %-100 %] 99 % (09/16 2200) Weight:  [65.3 kg] 65.3 kg (09/16 1938)   Filed Weights   05/08/19 1938  Weight: 65.3 kg     Physical Exam     General:  Tachypneic HEENT: Normal Neck: Supple. JVP 16+ cm. Carotids 2+ bilat; no bruits. No lymphadenopathy or thyromegaly appreciated. Cor: PMI lateral. Mildly tachy, regular rate & rhythm. +S3. 2/6 HSM LLSB. Lungs: Crackles at bases.  Abdomen: Soft, nontender, nondistended. No hepatosplenomegaly. No bruits or masses. Good bowel sounds. Extremities: No cyanosis, clubbing, rash. 1+ edema 1/2 to knees bilaterally.  Neuro: Alert & oriented x 3, cranial nerves grossly intact. moves all 4 extremities w/o difficulty. Affect pleasant.   Telemetry   Sinus tachycardia 110s (personally reviewed).   EKG   Sinus tachy 113, narrow QRS, poor RWP (personally reviewed).   Labs     Basic Metabolic Panel: Recent Labs  Lab 05/08/19 2018 05/08/19 2055  NA 132* 133*  K 4.0 4.1  CL 98  --   CO2 21*  --   GLUCOSE 91  --   BUN 27*  --   CREATININE 1.19  --   CALCIUM  9.0  --     Liver Function Tests: Recent Labs  Lab 05/08/19 2018  AST 131*  ALT 147*  ALKPHOS 138*  BILITOT 3.2*  PROT 7.2  ALBUMIN 3.2*   No results for input(s): LIPASE, AMYLASE in  the last 168 hours. No results for input(s): AMMONIA in the last 168 hours.  CBC: Recent Labs  Lab 05/08/19 2018 05/08/19 2055  WBC 3.8*  --   HGB 12.1* 14.3  HCT 37.3* 42.0  MCV 81.3  --   PLT 186  --     Cardiac Enzymes: No results for input(s): CKTOTAL, CKMB, CKMBINDEX, TROPONINI in the last 168 hours.  BNP: BNP (last 3 results) Recent Labs    01/24/19 1955 02/13/19 2243 05/08/19 2018  BNP 2,792.5* 3,902.9* 3,017.6*    ProBNP (last 3 results) No results for input(s): PROBNP in the last 8760 hours.   CBG: No results for input(s): GLUCAP in the last 168 hours.  Coagulation Studies: No results for input(s): LABPROT, INR in the last 72 hours.  Imaging: Dg Chest Port 1 View  Result Date: 05/08/2019 CLINICAL DATA:  SOB and center chest pain x3 days. Pt states he had a cardiac cath and stent placement x2 weeks ago. Hx of asthma, CHF, substance abuse, stroke, COPD. EXAM: PORTABLE CHEST 1 VIEW COMPARISON:  04/12/2019 FINDINGS: The heart is enlarged and stable in configuration. The lungs are clear. No pulmonary edema or consolidation. IMPRESSION: 1. Stable cardiomegaly. 2. No evidence for acute cardiopulmonary abnormality. Electronically Signed   By: Nolon Nations M.D.   On: 05/08/2019 20:12      Assessment/Plan   1. Acute on chronic systolic CHF/cardiogenic shock:  History of nonischemic cardiomyopathy.  I reviewed his last echo from 5/20, EF is markedly low at 10% with LV apical thrombus. The RV is severely dysfunctional as well.  RHC in 8/20 with low output and high filling pressures, he was on milrinone during 8/20 admission but insisted on going home for funeral.  Today, he is markedly overloaded with cool extremities and elevated lactate, suspect low output HF persists.  - We discussed central line for access tonight.  He is adamant that he does not want a neck line again.  I will order a PICC for the morning. Will follow CVP and co-ox.  - Strong suspicion for  ongoing low output HF.  Start milrinone 0.375 mcg/kg/min (prior dose in hospital).   - After milrinone is started, I will give him 80 mg IV Lasix followed by Lasix gtt 12 mg/hr.  - Can continue digoxin and spironolactone, check digoxin level.  - Bipap for comfort for the time being.  - End stage biventricular cardiomyopathy.  Not sure there are great options here.  His RV is quite dysfunctional on echo and LFTs are elevated.  Recent cocaine use in 6/20, will resend UDS today.  RV dysfunction and social issues will be a barrier to LVAD.  I am also not sure that he realizes how sick he is (thinks his only issue tonight is dyspnea from rubbing alcohol in his nose).  2. LV thrombus: Noted on 5/20 echo.  - Continue warfarin, need INR tonight.  Cover with heparin gtt if INR low.  3. Elevated LFTs: Suspect this is due to RV failure/congestive hepatopathy.  4. History of substance abuse: Cocaine use as recently as 6/20.  - UDS today.   Loralie Champagne, MD 05/08/2019, 10:53 PM  Advanced Heart Failure Team Pager [phone] (M-F; 7a - 4p)  Please contact  [facility] for night-coverage after hours (4p -7a ) and weekends on [email or web address]

## 2019-05-09 ENCOUNTER — Inpatient Hospital Stay: Payer: Self-pay

## 2019-05-09 LAB — CBC WITH DIFFERENTIAL/PLATELET
Abs Immature Granulocytes: 0.01 10*3/uL (ref 0.00–0.07)
Basophils Absolute: 0 10*3/uL (ref 0.0–0.1)
Basophils Relative: 1 %
Eosinophils Absolute: 0 10*3/uL (ref 0.0–0.5)
Eosinophils Relative: 1 %
HCT: 33.5 % — ABNORMAL LOW (ref 39.0–52.0)
Hemoglobin: 11.5 g/dL — ABNORMAL LOW (ref 13.0–17.0)
Immature Granulocytes: 0 %
Lymphocytes Relative: 29 %
Lymphs Abs: 1.1 10*3/uL (ref 0.7–4.0)
MCH: 27.3 pg (ref 26.0–34.0)
MCHC: 34.3 g/dL (ref 30.0–36.0)
MCV: 79.4 fL — ABNORMAL LOW (ref 80.0–100.0)
Monocytes Absolute: 0.7 10*3/uL (ref 0.1–1.0)
Monocytes Relative: 17 %
Neutro Abs: 2.1 10*3/uL (ref 1.7–7.7)
Neutrophils Relative %: 52 %
Platelets: 180 10*3/uL (ref 150–400)
RBC: 4.22 MIL/uL (ref 4.22–5.81)
RDW: 16.4 % — ABNORMAL HIGH (ref 11.5–15.5)
WBC: 4 10*3/uL (ref 4.0–10.5)
nRBC: 0 % (ref 0.0–0.2)

## 2019-05-09 LAB — TROPONIN I (HIGH SENSITIVITY): Troponin I (High Sensitivity): 44 ng/L — ABNORMAL HIGH (ref <18)

## 2019-05-09 LAB — BASIC METABOLIC PANEL
Anion gap: 10 (ref 5–15)
BUN: 23 mg/dL — ABNORMAL HIGH (ref 6–20)
CO2: 29 mmol/L (ref 22–32)
Calcium: 8.8 mg/dL — ABNORMAL LOW (ref 8.9–10.3)
Chloride: 94 mmol/L — ABNORMAL LOW (ref 98–111)
Creatinine, Ser: 1.19 mg/dL (ref 0.61–1.24)
GFR calc Af Amer: >60 mL/min (ref >60)
GFR calc non Af Amer: >60 mL/min (ref >60)
Glucose, Bld: 106 mg/dL — ABNORMAL HIGH (ref 70–99)
Potassium: 3.8 mmol/L (ref 3.5–5.1)
Sodium: 133 mmol/L — ABNORMAL LOW (ref 135–145)

## 2019-05-09 LAB — COOXEMETRY PANEL
Carboxyhemoglobin: 2.1 % — ABNORMAL HIGH (ref 0.5–1.5)
Carboxyhemoglobin: 1 % (ref 0.5–1.5)
Methemoglobin: 1.2 % (ref 0.0–1.5)
Methemoglobin: 0.8 % (ref 0.0–1.5)
O2 Saturation: 95.5 %
O2 Saturation: 36.9 %
Total hemoglobin: 11.6 g/dL — ABNORMAL LOW (ref 12.0–16.0)
Total hemoglobin: 11.4 g/dL — ABNORMAL LOW (ref 12.0–16.0)

## 2019-05-09 LAB — RAPID URINE DRUG SCREEN, HOSP PERFORMED
Amphetamines: NOT DETECTED
Barbiturates: NOT DETECTED
Benzodiazepines: NOT DETECTED
Cocaine: NOT DETECTED
Opiates: NOT DETECTED
Tetrahydrocannabinol: NOT DETECTED

## 2019-05-09 LAB — LACTIC ACID, PLASMA
Lactic Acid, Venous: 1.8 mmol/L (ref 0.5–1.9)
Lactic Acid, Venous: 1.8 mmol/L (ref 0.5–1.9)

## 2019-05-09 LAB — PROTIME-INR
INR: 2.7 — ABNORMAL HIGH (ref 0.8–1.2)
INR: 2.8 — ABNORMAL HIGH (ref 0.8–1.2)
INR: 3.5 — ABNORMAL HIGH (ref 0.8–1.2)
Prothrombin Time: 28.2 seconds — ABNORMAL HIGH (ref 11.4–15.2)
Prothrombin Time: 29 seconds — ABNORMAL HIGH (ref 11.4–15.2)
Prothrombin Time: 34.8 seconds — ABNORMAL HIGH (ref 11.4–15.2)

## 2019-05-09 LAB — DIGOXIN LEVEL: Digoxin Level: 0.2 ng/mL — ABNORMAL LOW (ref 0.8–2.0)

## 2019-05-09 LAB — MRSA PCR SCREENING: MRSA by PCR: NEGATIVE

## 2019-05-09 MED ORDER — OLOPATADINE HCL 0.1 % OP SOLN
1.0000 [drp] | Freq: Every morning | OPHTHALMIC | Status: DC
  Filled 2019-05-09: qty 5

## 2019-05-09 MED ORDER — ACETAMINOPHEN 325 MG PO TABS: 650.0000 mg | ORAL_TABLET | ORAL | Status: DC | PRN

## 2019-05-09 MED ORDER — SODIUM CHLORIDE 0.9% FLUSH
3.0000 mL | Freq: Two times a day (BID) | INTRAVENOUS | Status: DC
  Administered 2019-05-09 – 2019-05-13 (×5): 3 mL via INTRAVENOUS

## 2019-05-09 MED ORDER — DIGOXIN 125 MCG PO TABS
0.1250 mg | ORAL_TABLET | Freq: Every day | ORAL | Status: DC
  Administered 2019-05-09 – 2019-05-13 (×5): 0.125 mg via ORAL
  Filled 2019-05-09 (×5): qty 1

## 2019-05-09 MED ORDER — FERROUS SULFATE 325 (65 FE) MG PO TABS
325.0000 mg | ORAL_TABLET | Freq: Every day | ORAL | Status: DC
  Administered 2019-05-09 – 2019-05-13 (×5): 325 mg via ORAL
  Filled 2019-05-09 (×5): qty 1

## 2019-05-09 MED ORDER — SODIUM CHLORIDE 0.9 % IV SOLN: 250.0000 mL | INTRAVENOUS | Status: DC | PRN

## 2019-05-09 MED ORDER — SODIUM CHLORIDE 0.9% FLUSH: 10.0000 mL | INTRAVENOUS | Status: DC | PRN

## 2019-05-09 MED ORDER — FUROSEMIDE 10 MG/ML IJ SOLN
80.0000 mg | Freq: Once | INTRAMUSCULAR | Status: AC
  Administered 2019-05-09: 05:00:00 80 mg via INTRAVENOUS
  Filled 2019-05-09: qty 8

## 2019-05-09 MED ORDER — CHLORHEXIDINE GLUCONATE CLOTH 2 % EX PADS
6.0000 | MEDICATED_PAD | Freq: Every day | CUTANEOUS | Status: DC
  Administered 2019-05-09 – 2019-05-12 (×4): 6 via TOPICAL

## 2019-05-09 MED ORDER — ALBUTEROL SULFATE (2.5 MG/3ML) 0.083% IN NEBU: 3.0000 mL | INHALATION_SOLUTION | RESPIRATORY_TRACT | Status: DC | PRN

## 2019-05-09 MED ORDER — WARFARIN - PHARMACIST DOSING INPATIENT
Freq: Every day | Status: DC
  Administered 2019-05-09: 5
  Administered 2019-05-10 – 2019-05-12 (×2)

## 2019-05-09 MED ORDER — ACETAMINOPHEN 500 MG PO TABS: 500.0000 mg | ORAL_TABLET | Freq: Four times a day (QID) | ORAL | Status: DC | PRN

## 2019-05-09 MED ORDER — FUROSEMIDE 10 MG/ML IJ SOLN
12.0000 mg/h | INTRAVENOUS | Status: DC
  Administered 2019-05-09: 12 mg/h via INTRAVENOUS
  Filled 2019-05-09: qty 25
  Filled 2019-05-09: qty 21

## 2019-05-09 MED ORDER — ONDANSETRON HCL 4 MG/2ML IJ SOLN: 4.0000 mg | Freq: Four times a day (QID) | INTRAMUSCULAR | Status: DC | PRN

## 2019-05-09 MED ORDER — SPIRONOLACTONE 25 MG PO TABS
25.0000 mg | ORAL_TABLET | Freq: Every day | ORAL | Status: DC
  Administered 2019-05-09 – 2019-05-13 (×5): 25 mg via ORAL
  Filled 2019-05-09 (×5): qty 1

## 2019-05-09 MED ORDER — WARFARIN SODIUM 5 MG PO TABS
5.0000 mg | ORAL_TABLET | Freq: Once | ORAL | Status: AC
  Administered 2019-05-09: 5 mg via ORAL
  Filled 2019-05-09: qty 1

## 2019-05-09 MED ORDER — ALBUTEROL SULFATE HFA 108 (90 BASE) MCG/ACT IN AERS
1.0000 | INHALATION_SPRAY | RESPIRATORY_TRACT | Status: DC | PRN
  Filled 2019-05-09: qty 6.7

## 2019-05-09 MED ORDER — MOMETASONE FURO-FORMOTEROL FUM 100-5 MCG/ACT IN AERO
2.0000 | INHALATION_SPRAY | Freq: Two times a day (BID) | RESPIRATORY_TRACT | Status: DC
  Administered 2019-05-09 – 2019-05-13 (×9): 2 via RESPIRATORY_TRACT
  Filled 2019-05-09: qty 8.8

## 2019-05-09 MED ORDER — SODIUM CHLORIDE 0.9% FLUSH: 3.0000 mL | INTRAVENOUS | Status: DC | PRN

## 2019-05-09 MED ORDER — SODIUM CHLORIDE 0.9% FLUSH
10.0000 mL | Freq: Two times a day (BID) | INTRAVENOUS | Status: DC
  Administered 2019-05-09 – 2019-05-13 (×6): 10 mL

## 2019-05-09 MED ORDER — POTASSIUM CHLORIDE CRYS ER 20 MEQ PO TBCR
40.0000 meq | EXTENDED_RELEASE_TABLET | Freq: Every day | ORAL | Status: DC
  Administered 2019-05-09 – 2019-05-13 (×5): 40 meq via ORAL
  Filled 2019-05-09 (×5): qty 2

## 2019-05-09 NOTE — Progress Notes (Signed)
ANTICOAGULATION CONSULT NOTE - Initial Consult  Pharmacy Consult for Coumadin Indication: h/o LV thrombus  Allergies  Allergen Reactions  . Clindamycin/Lincomycin Swelling and Palpitations  . Hydrocodone Hives  . Lisinopril Swelling and Other (See Comments)    Facial swelling/angioedema  . Prednisone Shortness Of Breath, Nausea Only, Swelling and Other (See Comments)    Also made chest feel tight and genital area, legs, and face became swollen badly  . Bactrim [Sulfamethoxazole-Trimethoprim] Other (See Comments)    unknown  . Penicillins Hives and Swelling     Has patient had a PCN reaction causing immediate rash, facial/tongue/throat swelling, SOB or lightheadedness with hypotension: Yes Has patient had a PCN reaction causing severe rash involving mucus membranes or skin necrosis: No Has patient had a PCN reaction that required hospitalization: No Has patient had a PCN reaction occurring within the last 10 years: No If all of the above answers are "NO", then may proceed with Cephalosporin use.     Patient Measurements: Height: 5\' 9"  (175.3 cm) Weight: 144 lb (65.3 kg) IBW/kg (Calculated) : 70.7  Vital Signs: Temp: 97.4 F (36.3 C) (09/16 1935) Temp Source: Oral (09/16 1935) BP: 129/102 (09/17 0130) Pulse Rate: 102 (09/17 0130)  Labs: Recent Labs    05/08/19 2018 05/08/19 2055 05/08/19 2339  HGB 12.1* 14.3  --   HCT 37.3* 42.0  --   PLT 186  --   --   LABPROT  --   --  34.8*  INR  --   --  3.5*  CREATININE 1.19  --   --   TROPONINIHS 39*  --  44*    Estimated Creatinine Clearance: 72.4 mL/min (by C-G formula based on SCr of 1.19 mg/dL).   Medical History: Past Medical History:  Diagnosis Date  . Asthma   . Chronic systolic CHF (congestive heart failure) ([place])   . Cigarette smoker   . CKD (chronic kidney disease), stage II    Archie Endo 10/01/2017  . COPD (chronic obstructive pulmonary disease) ([place]) 10/21/2017   on CT scan chest  . History of echocardiogram     a. Echo 5/17 - EF 20-25%, severe diff HK, restrictive physiology, mild to mod MR, severe reduced RVSF, mod RVE, mild RAE, mod TR, PASP 48 mmHg  . Hx of cardiac cath    a. LHC 5/17 - normal coronary arteries. PA 45/25, mean 33, PCWP mean 18  . NICM (nonischemic cardiomyopathy) ([place])   . Stroke ([place]) 09/27/2017   "was weak on my left side; I'm fully recovered" (11/09/2017)  . Substance abuse ([place])    cocaine, marijuana    Assessment: 43yo male admitted w/ acute on chronic CHF w/ cardiogenic shock, to continue Coumadin for h/o LV thrombus; current INR above goal w/ last dose of Coumadin taken 9/15 (missed 9/16 dose); of note INR was above goal at last anticoag OV 2wk ago.  Goal of Therapy:  INR 2-3   Plan:  Will hold Coumadin for now and recheck INR >> consider resuming 9/17 pm after one skipped dose if INR is not trending up.  Wynona Neat, PharmD, BCPS  05/09/2019,2:31 AM

## 2019-05-09 NOTE — Progress Notes (Addendum)
Advanced Heart Failure Rounding Note  PCP-Cardiologist: Mark Chandler, MD   Subjective:   Admitted with recurrent cardiogenic shock. Started milrinone 0.375 mcg + lasix drip 12 mg/per hour. Negative 1.3 liters.   Complaining of nasal congestion. He says that is his only problem. SOB with exertion.    Objective:   Weight Range: 65.3 kg Body mass index is 21.27 kg/m.   Vital Signs:   Temp:  [96.9 F (36.1 C)-97.4 F (36.3 C)] 97.4 F (36.3 C) (09/17 0745) Pulse Rate:  [52-115] 115 (09/17 0800) Resp:  [18-40] 24 (09/17 0800) BP: (102-129)/(63-102) 116/97 (09/17 0745) SpO2:  [94 %-100 %] 94 % (09/17 0800) Weight:  [65.3 kg] 65.3 kg (09/16 1938)    Weight change: Filed Weights   05/08/19 1938  Weight: 65.3 kg    Intake/Output:   Intake/Output Summary (Last 24 hours) at 05/09/2019 0832 Last data filed at 05/09/2019 0615 Gross per 24 hour  Intake -  Output 1300 ml  Net -1300 ml      Physical Exam    General:  No resp difficulty HEENT: Normal Neck: Supple. JVP to jaw  Carotids 2+ bilat; no bruits. No lymphadenopathy or thyromegaly appreciated. Cor: PMI nondisplaced. Regular tachy. 2/6 TR/MR. + S3  Lungs: Crackles in bases.  Abdomen: Soft, nontender, nondistended. No hepatosplenomegaly. No bruits or masses. Good bowel sounds. Extremities: warm,  No cyanosis, clubbing, rash, R and LLE 1+ edema Neuro: Alert & orientedx3, cranial nerves grossly intact. moves all 4 extremities w/o difficulty. Affect pleasant   Telemetry   Sinus Tach with PVCs. Personally reviewed   EKG    N/A   Labs    CBC Recent Labs    05/08/19 2018 05/08/19 2055 05/09/19 0505  WBC 3.8*  --  4.0  NEUTROABS  --   --  2.1  HGB 12.1* 14.3 11.5*  HCT 37.3* 42.0 33.5*  MCV 81.3  --  79.4*  PLT 186  --  161   Basic Metabolic Panel Recent Labs    05/08/19 2018 05/08/19 2055 05/09/19 0505  NA 132* 133* 133*  K 4.0 4.1 3.8  CL 98  --  94*  CO2 21*  --  29  GLUCOSE 91   --  106*  BUN 27*  --  23*  CREATININE 1.19  --  1.19  CALCIUM 9.0  --  8.8*   Liver Function Tests Recent Labs    05/08/19 2018  AST 131*  ALT 147*  ALKPHOS 138*  BILITOT 3.2*  PROT 7.2  ALBUMIN 3.2*   No results for input(s): LIPASE, AMYLASE in the last 72 hours. Cardiac Enzymes No results for input(s): CKTOTAL, CKMB, CKMBINDEX, TROPONINI in the last 72 hours.  BNP: BNP (last 3 results) Recent Labs    01/24/19 1955 02/13/19 2243 05/08/19 2018  BNP 2,792.5* 3,902.9* 3,017.6*    ProBNP (last 3 results) No results for input(s): PROBNP in the last 8760 hours.   D-Dimer No results for input(s): DDIMER in the last 72 hours. Hemoglobin A1C No results for input(s): HGBA1C in the last 72 hours. Fasting Lipid Panel No results for input(s): CHOL, HDL, LDLCALC, TRIG, CHOLHDL, LDLDIRECT in the last 72 hours. Thyroid Function Tests No results for input(s): TSH, T4TOTAL, T3FREE, THYROIDAB in the last 72 hours.  Invalid input(s): FREET3  Other results:   Imaging    Dg Chest Port 1 View  Result Date: 05/08/2019 CLINICAL DATA:  SOB and center chest pain x3 days. Pt states he had a  cardiac cath and stent placement x2 weeks ago. Hx of asthma, CHF, substance abuse, stroke, COPD. EXAM: PORTABLE CHEST 1 VIEW COMPARISON:  04/12/2019 FINDINGS: The heart is enlarged and stable in configuration. The lungs are clear. No pulmonary edema or consolidation. IMPRESSION: 1. Stable cardiomegaly. 2. No evidence for acute cardiopulmonary abnormality. Electronically Signed   By: Mark [name] M.D.   On: 05/08/2019 20:12   [place] Ekg Site Rite  Result Date: 05/09/2019 If Site Rite image not attached, placement could not be confirmed due to current cardiac rhythm.  US Abdomen Limited Ruq  Result Date: 05/09/2019 CLINICAL DATA:  Elevated LFTs EXAM: ULTRASOUND ABDOMEN LIMITED RIGHT UPPER QUADRANT COMPARISON:  04/13/2019 FINDINGS: Gallbladder: Considerable wall thickening to 14 mm is noted. No  definitive gallstones are seen. These changes are likely related to mild ascites as well as decreased albumin level. Common bile duct: Diameter: 1.5 mm. Liver: Heterogeneity is noted without focal mass. Moderate perihepatic fluid is noted increased from the prior study. Portal vein is patent on color Doppler imaging with normal direction of blood flow towards the liver. Other: None. IMPRESSION: Changes of ascites with gallbladder wall thickening likely reactive in nature. No evidence of cholelithiasis is seen. Heterogeneity of the liver is noted. This may be related to the changes seen on prior CT examination of hepatic congestion from increased right heart pressure. Electronically Signed   By: Mark [name] M.D.   On: 05/09/2019 00:00      Medications:     Scheduled Medications: . digoxin  0.125 mg Oral Daily  . ferrous sulfate  325 mg Oral Daily  . mometasone-formoterol  2 puff Inhalation BID  . olopatadine  1 drop Both Eyes q morning - 10a  . potassium chloride  40 mEq Oral Daily  . sodium chloride flush  3 mL Intravenous Q12H  . spironolactone  25 mg Oral Daily  . warfarin  5 mg Oral ONCE-1800  . Warfarin - Pharmacist Dosing Inpatient   Does not apply q1800     Infusions: . sodium chloride    . furosemide (LASIX) infusion 12 mg/hr (05/09/19 0530)  . milrinone 0.375 mcg/kg/min (05/08/19 2303)     PRN Medications:  sodium chloride, acetaminophen, albuterol, ondansetron (ZOFRAN) IV, sodium chloride flush    Patient Profile  Mark [name] a 43 y.o.malewith a history ofchronic systolic CHF (EF 50-38% with severe diffuse hypokinesis5/2020,NICM by cardiac cath 2017, history of right MCA CVA 2019, asthma, andpolysubstance use.  Admitted with recurrent cardiogenic shock.   Assessment/Plan   1. Acute on chronic systolic CHF/cardiogenic shock:  History of nonischemic cardiomyopathy.  I reviewed his last echo from 5/20, EF is markedly low at 10% with LV apical thrombus. The  RV is severely dysfunctional as well.  RHC in 8/20 with low output and high filling pressures, he was on milrinone during 8/20 admission but insisted on going home for funeral.  Mark [name] admit he was markedly overloaded with cool extremities and elevated lactate, suspect low output HF persists. Lactate trending down.  -   He is adamant that he does not want a neck line again. Hold off on PICC.  - - Strong suspicion for ongoing low output HF.  Start milrinone 0.375 mcg/kg/min (prior dose in hospital).   - Continue milrinone 0.375 mcg + lasix drip 12 mg per hour. Volume status elevated.  - Can continue digoxin and spironolactone, check digoxin level.  - End stage biventricular cardiomyopathy.  Not sure there are great options here.  His RV  is quite dysfunctional on echo and LFTs are elevated.   RV dysfunction and social issues will be a barrier to LVAD.  - He does not believe his heart is weak. He is adamant that this is a from nasal congestion. 2. LV thrombus: Noted on 5/20 echo.  - Continue warfarin.  - Pharmacy to dose coumadin. INR 2.7   3. Elevated LFTs: Suspect this is due to RV failure/congestive hepatopathy.  4. History of substance abuse: Cocaine use as recently as 6/20.  - UDS negative. UDS negative on admit.   Try to place PICC today if he is agreeable.    Length of Stay: 1  Amy Clegg, NP  05/09/2019, 8:32 AM  Advanced Heart Failure Team Pager [phone] (M-F; 7a - 4p)  Please contact [facility] for night-coverage after hours (4p -7a ) and weekends on [email or web address]  Agree with above.   Exam consistent with cardiogenic shock and low output General:  Weak appearing. No resp difficulty HEENT: normal Neck: supple.JVP elevated Carotids 2+ bilat; no bruits. No lymphadenopathy or thryomegaly appreciated. Cor: PMI laterally displaced. Regular tachy prominent s3 +MR/TR Lungs: clear Abdomen: soft, nontender, nondistended. No hepatosplenomegaly. No bruits or masses. Good bowel sounds.  Extremities: no cyanosis, clubbing, rash, tr edema cool  Neuro: alert & orientedx3, cranial nerves grossly intact. moves all 4 extremities w/o difficulty. Affect pleasant   He was admitted with recurrent cardiogenic shock with lactic acidosis and volume overload. He has little to no insight to how sick he is. Given severe biventricular HF the only option for him would be transplant and he is not candidate with recent + UDS (negative this admit) and doubt he would even entertain the concept. Not sure their is much else to do here. Inotropes have been started and lactic acidosis improved but he is not ideal candidate for home inotropes and he previously was not interested in this. Would involve Palliative Care though I am not sure he is in a position to be receptive to their input.   CRITICAL CARE Performed by: Arvilla MeresBensimhon, Luvena Wentling  Total critical care time: 35 minutes  Critical care time was exclusive of separately billable procedures and treating other patients.  Critical care was necessary to treat or prevent imminent or life-threatening deterioration.  Critical care was time spent personally by me (independent of midlevel providers or residents) on the following activities: development of treatment plan with patient and/or surrogate as well as nursing, discussions with consultants, evaluation of patient's response to treatment, examination of patient, obtaining history from patient or surrogate, ordering and performing treatments and interventions, ordering and review of laboratory studies, ordering and review of radiographic studies, pulse oximetry and re-evaluation of patient's condition.  Arvilla Meresaniel Ellijah Leffel, MD  9:18 AM

## 2019-05-09 NOTE — Progress Notes (Signed)
Peripherally Inserted Central Catheter/Midline Placement  The IV Nurse has discussed with the patient and/or persons authorized to consent for the patient, the purpose of this procedure and the potential benefits and risks involved with this procedure.  The benefits include less needle sticks, lab draws from the catheter, and the patient may be discharged home with the catheter. Risks include, but not limited to, infection, bleeding, blood clot (thrombus formation), and puncture of an artery; nerve damage and irregular heartbeat and possibility to perform a PICC exchange if needed/ordered by physician.  Alternatives to this procedure were also discussed.  B[employer] Power PICC patient education guide, fact sheet on infection prevention and patient information card has been provided to patient /or left at bedside.    PICC/Midline Placement Documentation  PICC Triple Lumen 05/09/19 PICC Right Brachial 41 cm 0 cm (Active)  Indication for Insertion or Continuance of Line Chronic illness with exacerbations (CF, Sickle Cell, etc.) 05/09/19 0942  Exposed Catheter (cm) 0 cm 05/09/19 0942  Site Assessment Clean;Dry;Intact 05/09/19 0942  Lumen #1 Status Flushed;Blood return noted;Saline locked 05/09/19 0942  Lumen #2 Status Flushed;Blood return noted;Saline locked 05/09/19 0942  Lumen #3 Status Flushed;Blood return noted;Saline locked 05/09/19 0942  Dressing Type Transparent;Securing device 05/09/19 4193  Dressing Status Clean;Dry;Intact;Antimicrobial disc in place 05/09/19 0942  Dressing Change Due 05/16/19 05/09/19 0942       Mark [name] 05/09/2019, 9:50 AM

## 2019-05-09 NOTE — Progress Notes (Signed)
CSW acknowledges patient is followed by [facility] Failure [facility] and Heart Failure CSW [place]. Will continue to follow for dispo needs.   [place], [place]

## 2019-05-09 NOTE — Progress Notes (Signed)
[place] for Coumadin Indication: h/o LV thrombus  Allergies  Allergen Reactions  . Clindamycin/Lincomycin Swelling and Palpitations  . Hydrocodone Hives  . Lisinopril Swelling and Other (See Comments)    Facial swelling/angioedema  . Prednisone Shortness Of Breath, Nausea Only, Swelling and Other (See Comments)    Also made chest feel tight and genital area, legs, and face became swollen badly  . Bactrim [Sulfamethoxazole-Trimethoprim] Other (See Comments)    unknown  . Penicillins Hives and Swelling     Has patient had a PCN reaction causing immediate rash, facial/tongue/throat swelling, SOB or lightheadedness with hypotension: Yes Has patient had a PCN reaction causing severe rash involving mucus membranes or skin necrosis: No Has patient had a PCN reaction that required hospitalization: No Has patient had a PCN reaction occurring within the last 10 years: No If all of the above answers are "NO", then may proceed with Cephalosporin use.     Patient Measurements: Height: 5\' 9"  (175.3 cm) Weight: 144 lb (65.3 kg) IBW/kg (Calculated) : 70.7  Vital Signs: Temp: 97.4 F (36.3 C) (09/17 0745) Temp Source: Axillary (09/17 0745) BP: 116/97 (09/17 0745) Pulse Rate: 112 (09/17 0745)  Labs: Recent Labs    05/08/19 2018 05/08/19 2055 05/08/19 2339 05/09/19 0428 05/09/19 0505  HGB 12.1* 14.3  --   --  11.5*  HCT 37.3* 42.0  --   --  33.5*  PLT 186  --   --   --  180  LABPROT  --   --  34.8* 29.0* 28.2*  INR  --   --  3.5* 2.8* 2.7*  CREATININE 1.19  --   --   --  1.19  TROPONINIHS 39*  --  44*  --   --     Estimated Creatinine Clearance: 72.4 mL/min (by C-G formula based on SCr of 1.19 mg/dL).   Medical History: Past Medical History:  Diagnosis Date  . Asthma   . Chronic systolic CHF (congestive heart failure) ([place])   . Cigarette smoker   . CKD (chronic kidney disease), stage II    Archie Endo 10/01/2017  . COPD (chronic  obstructive pulmonary disease) ([place]) 10/21/2017   on CT scan chest  . History of echocardiogram    a. Echo 5/17 - EF 20-25%, severe diff HK, restrictive physiology, mild to mod MR, severe reduced RVSF, mod RVE, mild RAE, mod TR, PASP 48 mmHg  . Hx of cardiac cath    a. LHC 5/17 - normal coronary arteries. PA 45/25, mean 33, PCWP mean 18  . NICM (nonischemic cardiomyopathy) ([place])   . Stroke ([place]) 09/27/2017   "was weak on my left side; I'm fully recovered" (11/09/2017)  . Substance abuse ([place])    cocaine, marijuana    Assessment: 43yo male admitted w/ acute on chronic CHF w/ cardiogenic shock, to continue Coumadin for h/o LV thrombus. -INR= 2.7  Home coumadin dose (per clinic records)- coumadin dose was 5mg /day except take 2.5mg  on Tuesday  Goal of Therapy:  INR 2-3   Plan:  -Coumadin 5mg  po today -Daily PT/INR  Hildred Laser, PharmD Clinical Pharmacist **Pharmacist phone directory can now be found on [email or web address] ([email or web address]).  Listed under [place].

## 2019-05-09 NOTE — ED Notes (Signed)
SDU  Breakfast ordered  

## 2019-05-09 NOTE — Progress Notes (Signed)
Pt. Covid test came back negative per RN. Pt. Placed on bipap as per order. Tolerating well at this time.

## 2019-05-10 LAB — BASIC METABOLIC PANEL
Anion gap: 10 (ref 5–15)
BUN: 14 mg/dL (ref 6–20)
CO2: 33 mmol/L — ABNORMAL HIGH (ref 22–32)
Calcium: 8.4 mg/dL — ABNORMAL LOW (ref 8.9–10.3)
Chloride: 89 mmol/L — ABNORMAL LOW (ref 98–111)
Creatinine, Ser: 1.05 mg/dL (ref 0.61–1.24)
GFR calc Af Amer: >60 mL/min (ref >60)
GFR calc non Af Amer: >60 mL/min (ref >60)
Glucose, Bld: 147 mg/dL — ABNORMAL HIGH (ref 70–99)
Potassium: 3 mmol/L — ABNORMAL LOW (ref 3.5–5.1)
Sodium: 132 mmol/L — ABNORMAL LOW (ref 135–145)

## 2019-05-10 LAB — CBC WITH DIFFERENTIAL/PLATELET
Abs Immature Granulocytes: 0.01 10*3/uL (ref 0.00–0.07)
Basophils Absolute: 0 10*3/uL (ref 0.0–0.1)
Basophils Relative: 0 %
Eosinophils Absolute: 0.1 10*3/uL (ref 0.0–0.5)
Eosinophils Relative: 1 %
HCT: 34.4 % — ABNORMAL LOW (ref 39.0–52.0)
Hemoglobin: 11 g/dL — ABNORMAL LOW (ref 13.0–17.0)
Immature Granulocytes: 0 %
Lymphocytes Relative: 24 %
Lymphs Abs: 1 10*3/uL (ref 0.7–4.0)
MCH: 25.9 pg — ABNORMAL LOW (ref 26.0–34.0)
MCHC: 32 g/dL (ref 30.0–36.0)
MCV: 80.9 fL (ref 80.0–100.0)
Monocytes Absolute: 0.9 10*3/uL (ref 0.1–1.0)
Monocytes Relative: 21 %
Neutro Abs: 2.2 10*3/uL (ref 1.7–7.7)
Neutrophils Relative %: 54 %
Platelets: 179 10*3/uL (ref 150–400)
RBC: 4.25 MIL/uL (ref 4.22–5.81)
RDW: 16.6 % — ABNORMAL HIGH (ref 11.5–15.5)
WBC: 4.2 10*3/uL (ref 4.0–10.5)
nRBC: 0 % (ref 0.0–0.2)

## 2019-05-10 LAB — COOXEMETRY PANEL
Carboxyhemoglobin: 1.7 % — ABNORMAL HIGH (ref 0.5–1.5)
Methemoglobin: 1.1 % (ref 0.0–1.5)
O2 Saturation: 62.4 %
Total hemoglobin: 11.3 g/dL — ABNORMAL LOW (ref 12.0–16.0)

## 2019-05-10 LAB — PROTIME-INR
INR: 2.3 — ABNORMAL HIGH (ref 0.8–1.2)
Prothrombin Time: 24.7 seconds — ABNORMAL HIGH (ref 11.4–15.2)

## 2019-05-10 MED ORDER — FUROSEMIDE 80 MG PO TABS
80.0000 mg | ORAL_TABLET | Freq: Two times a day (BID) | ORAL | Status: DC
  Administered 2019-05-10 – 2019-05-11 (×2): 80 mg via ORAL
  Filled 2019-05-10 (×2): qty 1

## 2019-05-10 MED ORDER — POTASSIUM CHLORIDE CRYS ER 20 MEQ PO TBCR
40.0000 meq | EXTENDED_RELEASE_TABLET | Freq: Once | ORAL | Status: AC
  Administered 2019-05-10: 17:00:00 40 meq via ORAL
  Filled 2019-05-10: qty 2

## 2019-05-10 MED ORDER — POTASSIUM CHLORIDE CRYS ER 20 MEQ PO TBCR
20.0000 meq | EXTENDED_RELEASE_TABLET | Freq: Once | ORAL | Status: AC
  Administered 2019-05-11: 20 meq via ORAL
  Filled 2019-05-10: qty 1

## 2019-05-10 MED ORDER — TRAMADOL HCL 50 MG PO TABS
50.0000 mg | ORAL_TABLET | Freq: Four times a day (QID) | ORAL | Status: DC | PRN
  Filled 2019-05-10: qty 1

## 2019-05-10 MED ORDER — WARFARIN SODIUM 5 MG PO TABS
5.0000 mg | ORAL_TABLET | Freq: Once | ORAL | Status: AC
  Administered 2019-05-10: 5 mg via ORAL
  Filled 2019-05-10: qty 1

## 2019-05-10 MED ORDER — POTASSIUM CHLORIDE CRYS ER 20 MEQ PO TBCR
20.0000 meq | EXTENDED_RELEASE_TABLET | Freq: Once | ORAL | Status: AC
  Administered 2019-05-10: 20 meq via ORAL
  Filled 2019-05-10: qty 1

## 2019-05-10 MED ORDER — POTASSIUM CHLORIDE CRYS ER 20 MEQ PO TBCR
40.0000 meq | EXTENDED_RELEASE_TABLET | Freq: Once | ORAL | Status: AC
  Administered 2019-05-10: 40 meq via ORAL
  Filled 2019-05-10: qty 2

## 2019-05-10 MED ORDER — POTASSIUM CHLORIDE CRYS ER 20 MEQ PO TBCR
40.0000 meq | EXTENDED_RELEASE_TABLET | Freq: Every day | ORAL | Status: DC
  Administered 2019-05-12: 40 meq via ORAL
  Filled 2019-05-10: qty 2

## 2019-05-10 NOTE — Progress Notes (Signed)
While assessing patient tonight I found a medicine cup that had 60Meq of Potassium on the bedside table. Asked patient why those weren't taken and he stated they fell on the floor. Potassium was disposed into sharps container. Called MD to get a new order and they advised to get RX to reorder those meds and they should be marked as not given. The 20Meq Potassium and the 40Meq was given at 1653 and 1655. Patients potassium level was 3.0 this morning and he was given additional doses of Lasix today. Will address this medication with RX to see if I can get another dose out for this patient.

## 2019-05-10 NOTE — Progress Notes (Addendum)
Advanced Heart Failure Rounding Note  PCP-Cardiologist: Lauree Chandler, MD   Subjective:   Admitted with recurrent cardiogenic shock. Started milrinone 0.375 mcg + lasix drip.  Over the last 24 hours he has diuresed over 6 liters.  Feeling a little better. Denies SOB.      Objective:   Weight Range: 61.9 kg Body mass index is 20.15 kg/m.   Vital Signs:   Temp:  [97.6 F (36.4 C)-98.6 F (37 C)] 98.3 F (36.8 C) (09/18 0800) Pulse Rate:  [50-115] 103 (09/18 0800) Resp:  [17-22] 17 (09/18 0800) BP: (98-116)/(76-93) 110/87 (09/18 0800) SpO2:  [93 %-100 %] 100 % (09/18 0829) Weight:  [61.9 kg] 61.9 kg (09/18 0349) Last BM Date: 05/09/19  Weight change: Filed Weights   05/08/19 1938 05/10/19 0349  Weight: 65.3 kg 61.9 kg    Intake/Output:   Intake/Output Summary (Last 24 hours) at 05/10/2019 1135 Last data filed at 05/10/2019 0400 Gross per 24 hour  Intake 140.22 ml  Output 6400 ml  Net -6259.78 ml      Physical Exam   CVP 5.  General:   No resp difficulty HEENT: normal Neck: supple. JVP 5-6 . Carotids 2+ bilat; no bruits. No lymphadenopathy or thryomegaly appreciated. Cor: PMI nondisplaced. Regular rate & rhythm. No rubs, or murmurs. +S3 Lungs: clear Abdomen: soft, nontender, nondistended. No hepatosplenomegaly. No bruits or masses. Good bowel sounds. Extremities: no cyanosis, clubbing, rash, edema. RUE PICC  Neuro: alert & orientedx3, cranial nerves grossly intact. moves all 4 extremities w/o difficulty. Affect pleasant   Telemetry   Sinus Tach 100s personally reviewed.    EKG    N/A   Labs    CBC Recent Labs    05/09/19 0505 05/10/19 0555  WBC 4.0 4.2  NEUTROABS 2.1 2.2  HGB 11.5* 11.0*  HCT 33.5* 34.4*  MCV 79.4* 80.9  PLT 180 485   Basic Metabolic Panel Recent Labs    05/09/19 0505 05/10/19 0555  NA 133* 132*  K 3.8 3.0*  CL 94* 89*  CO2 29 33*  GLUCOSE 106* 147*  BUN 23* 14  CREATININE 1.19 1.05  CALCIUM 8.8*  8.4*   Liver Function Tests Recent Labs    05/08/19 2018  AST 131*  ALT 147*  ALKPHOS 138*  BILITOT 3.2*  PROT 7.2  ALBUMIN 3.2*   No results for input(s): LIPASE, AMYLASE in the last 72 hours. Cardiac Enzymes No results for input(s): CKTOTAL, CKMB, CKMBINDEX, TROPONINI in the last 72 hours.  BNP: BNP (last 3 results) Recent Labs    01/24/19 1955 02/13/19 2243 05/08/19 2018  BNP 2,792.5* 3,902.9* 3,017.6*    ProBNP (last 3 results) No results for input(s): PROBNP in the last 8760 hours.   D-Dimer No results for input(s): DDIMER in the last 72 hours. Hemoglobin A1C No results for input(s): HGBA1C in the last 72 hours. Fasting Lipid Panel No results for input(s): CHOL, HDL, LDLCALC, TRIG, CHOLHDL, LDLDIRECT in the last 72 hours. Thyroid Function Tests No results for input(s): TSH, T4TOTAL, T3FREE, THYROIDAB in the last 72 hours.  Invalid input(s): FREET3  Other results:   Imaging    No results found.   Medications:     Scheduled Medications: . Chlorhexidine Gluconate Cloth  6 each Topical Daily  . digoxin  0.125 mg Oral Daily  . ferrous sulfate  325 mg Oral Daily  . mometasone-formoterol  2 puff Inhalation BID  . potassium chloride  20 mEq Oral Once  . potassium chloride  40 mEq Oral Daily  . potassium chloride  40 mEq Oral Once  . sodium chloride flush  10-40 mL Intracatheter Q12H  . sodium chloride flush  3 mL Intravenous Q12H  . spironolactone  25 mg Oral Daily  . warfarin  5 mg Oral ONCE-1800  . Warfarin - Pharmacist Dosing Inpatient   Does not apply q1800    Infusions: . sodium chloride    . furosemide (LASIX) infusion 12 mg/hr (05/09/19 1550)  . milrinone 0.375 mcg/kg/min (05/10/19 0030)    PRN Medications: sodium chloride, acetaminophen, albuterol, ondansetron (ZOFRAN) IV, sodium chloride flush, sodium chloride flush    Patient Profile  Mark [name] a 43 y.o.malewith a history ofchronic systolic CHF (EF 21-30%10-15% with severe  diffuse hypokinesis5/2020,NICM by cardiac cath 2017, history of right MCA CVA 2019, asthma, andpolysubstance use.  Admitted with recurrent cardiogenic shock.   Assessment/Plan   1. Acute on chronic systolic CHF/cardiogenic shock:  History of nonischemic cardiomyopathy.  I reviewed his last echo from 5/20, EF is markedly low at 10% with LV apical thrombus. The RV is severely dysfunctional as well.  RHC in 8/20 with low output and high filling pressures, he was on milrinone during 8/20 admission but insisted on going home for funeral.  Mark [name] admit he was markedly overloaded with cool extremities and elevated lactate, suspect low output HF persists. Lactate trending down.  -   He is adamant that he does not want a neck line again.   - - Initial CO-OX < 40%.  - Todays CO-OX is 62%. CVP 5.  - Cut back milrinone to 0.25 mcg. Check CO-OX in am.  - Stop lasix drip. Start lasix 80 mg po twice a day.  - Can continue digoxin and spironolactone, check digoxin level.  - End stage biventricular cardiomyopathy.  Not sure there are great options here.  His RV is quite dysfunctional on echo and LFTs are elevated.   RV dysfunction and social issues will be a barrier to LVAD.  - He does not believe his heart is weak. He is adamant that this is a from nasal congestion. 2. LV thrombus: Noted on 5/20 echo.  - Continue warfarin. INR 2.3  - Pharmacy to dose coumadin. INR 2.3  3. Elevated LFTs: Suspect this is due to RV failure/congestive hepatopathy.  4. History of substance abuse: Cocaine use as recently as 6/20.  - UDS negative. UDS negative on admit.  5. Hypokalemia- Supp K  Start 40 meq K . Check BMET in am.   Not at candidate for home inotropes /LVAD with substance abuse, social barrier, and RV failure  Needs Palliative Care for goals of care.     Length of Stay: 2  Tonye BecketAmy Clegg, NP  05/10/2019, 11:35 AM  Advanced Heart Failure Team Pager [phone] (M-F; 7a - 4p)  Please contact [facility] for  night-coverage after hours (4p -7a ) and weekends on [email or web address]  Patient seen and examined with the above-signed Advanced Practice Provider and/or Housestaff. I personally reviewed laboratory data, imaging studies and relevant notes. I independently examined the patient and formulated the important aspects of the plan. I have edited the note to reflect any of my changes or salient points. I have personally discussed the plan with the patient and/or family.  He is much improved with milrinone and diuresis. Lactic acidosis has cleared and he has diuresed well. Feels fatigued and chest is tight but no orthopnea or PND  General:  Weak thin appearing. No resp difficulty HEENT: normal  Neck: supple. no JVD. Carotids 2+ bilat; no bruits. No lymphadenopathy or thryomegaly appreciated. Cor: PMI laterally displaced. Regular rate & rhythm. 2/6 TR +s3 Lungs: clear Abdomen: soft, nontender, nondistended. No hepatosplenomegaly. No bruits or masses. Good bowel sounds. Extremities: no cyanosis, clubbing, rash, edema  PICC RUE multiple tattoos Neuro: alert & orientedx3, cranial nerves grossly intact. moves all 4 extremities w/o difficulty. Affect pleasant  He has severe biventricular failure. Given tobacco and cocaine use transplant is not an option. RV would likely not tolerated VAD. We discussed palliative inotropes vs hospice. He is not ready to discuss hospice with me yet. Will continue milrinone over the weekend and address whether or not home intoropes is a feasible option for him. I have suggested that no matter what his decision is that he should be thinking of getting his "affairs in order." He expressed understanding.   Arvilla Meres, MD  7:56 PM

## 2019-05-10 NOTE — Progress Notes (Signed)
Patient refusing to allow RN to do CVP measurement

## 2019-05-10 NOTE — Plan of Care (Signed)
  Problem: Education: Goal: Knowledge of General Education information will improve Description: Including pain rating scale, medication(s)/side effects and non-pharmacologic comfort measures Outcome: Progressing   Problem: Clinical Measurements: Goal: Ability to maintain clinical measurements within normal limits will improve Outcome: Progressing   Problem: Health Behavior/Discharge Planning: Goal: Ability to manage health-related needs will improve Outcome: Progressing   Problem: Clinical Measurements: Goal: Diagnostic test results will improve Outcome: Progressing   Problem: Activity: Goal: Risk for activity intolerance will decrease Outcome: Progressing   Problem: Nutrition: Goal: Adequate nutrition will be maintained Outcome: Progressing   Problem: Elimination: Goal: Will not experience complications related to urinary retention Outcome: Progressing   Problem: Pain Managment: Goal: General experience of comfort will improve Outcome: Progressing

## 2019-05-10 NOTE — Progress Notes (Signed)
ANTICOAGULATION CONSULT NOTE  Pharmacy Consult for Coumadin Indication: h/o LV thrombus  Allergies  Allergen Reactions  . Clindamycin/Lincomycin Swelling and Palpitations  . Hydrocodone Hives  . Lisinopril Swelling and Other (See Comments)    Facial swelling/angioedema  . Olopatadine Hcl Swelling    Makes his face swell up  . Prednisone Shortness Of Breath, Nausea Only, Swelling and Other (See Comments)    Also made chest feel tight and genital area, legs, and face became swollen badly  . Bactrim [Sulfamethoxazole-Trimethoprim] Other (See Comments)    unknown  . Penicillins Hives and Swelling     Has patient had a PCN reaction causing immediate rash, facial/tongue/throat swelling, SOB or lightheadedness with hypotension: Yes Has patient had a PCN reaction causing severe rash involving mucus membranes or skin necrosis: No Has patient had a PCN reaction that required hospitalization: No Has patient had a PCN reaction occurring within the last 10 years: No If all of the above answers are "NO", then may proceed with Cephalosporin use.     Patient Measurements: Height: 5\' 9"  (175.3 cm) Weight: 136 lb 7.4 oz (61.9 kg) IBW/kg (Calculated) : 70.7  Vital Signs: Temp: 98.3 F (36.8 C) (09/18 0800) Temp Source: Oral (09/18 0800) BP: 110/87 (09/18 0800) Pulse Rate: 103 (09/18 0800)  Labs: Recent Labs    05/08/19 2018 05/08/19 2055  05/08/19 2339 05/09/19 0428 05/09/19 0505 05/10/19 0555  HGB 12.1* 14.3  --   --   --  11.5* 11.0*  HCT 37.3* 42.0  --   --   --  33.5* 34.4*  PLT 186  --   --   --   --  180 179  LABPROT  --   --    < > 34.8* 29.0* 28.2* 24.7*  INR  --   --    < > 3.5* 2.8* 2.7* 2.3*  CREATININE 1.19  --   --   --   --  1.19 1.05  TROPONINIHS 39*  --   --  44*  --   --   --    < > = values in this interval not displayed.    Estimated Creatinine Clearance: 77.8 mL/min (by C-G formula based on SCr of 1.05 mg/dL).   Medical History: Past Medical History:   Diagnosis Date  . Asthma   . Chronic systolic CHF (congestive heart failure) (HCC)   . Cigarette smoker   . CKD (chronic kidney disease), stage II    [name] 10/01/2017  . COPD (chronic obstructive pulmonary disease) (HCC) 10/21/2017   on CT scan chest  . History of echocardiogram    a. Echo 5/17 - EF 20-25%, severe diff HK, restrictive physiology, mild to mod MR, severe reduced RVSF, mod RVE, mild RAE, mod TR, PASP 48 mmHg  . Hx of cardiac cath    a. LHC 5/17 - normal coronary arteries. PA 45/25, mean 33, PCWP mean 18  . NICM (nonischemic cardiomyopathy) (HCC)   . Stroke (HCC) 09/27/2017   "was weak on my left side; I'm fully recovered" (11/09/2017)  . Substance abuse (HCC)    cocaine, marijuana    Assessment: 43yo male admitted w/ acute on chronic CHF w/ cardiogenic shock, to continue Coumadin for h/o LV thrombus. -INR= 2.3  Home coumadin dose (per clinic records)- coumadin dose was 5mg /day except take 2.5mg  on Tuesday  Goal of Therapy:  INR 2-3   Plan:  -Coumadin 5mg  po again today -Daily PT/INR  Jenetta Downer, [facility] Clinical Pharmacist  Phone [phone]  05/10/2019 10:37 AM

## 2019-05-11 ENCOUNTER — Other Ambulatory Visit: Payer: Self-pay

## 2019-05-11 ENCOUNTER — Encounter (HOSPITAL_COMMUNITY): Payer: Self-pay | Admitting: *Deleted

## 2019-05-11 DIAGNOSIS — I5043 Acute on chronic combined systolic (congestive) and diastolic (congestive) heart failure: Secondary | ICD-10-CM

## 2019-05-11 LAB — COOXEMETRY PANEL
Carboxyhemoglobin: 0.9 % (ref 0.5–1.5)
Carboxyhemoglobin: 1.3 % (ref 0.5–1.5)
Methemoglobin: 0.7 % (ref 0.0–1.5)
Methemoglobin: 1 % (ref 0.0–1.5)
O2 Saturation: 32.9 %
O2 Saturation: 45.3 %
Total hemoglobin: 12 g/dL (ref 12.0–16.0)
Total hemoglobin: 11.3 g/dL — ABNORMAL LOW (ref 12.0–16.0)

## 2019-05-11 LAB — BASIC METABOLIC PANEL
Anion gap: 10 (ref 5–15)
BUN: 17 mg/dL (ref 6–20)
CO2: 31 mmol/L (ref 22–32)
Calcium: 8.9 mg/dL (ref 8.9–10.3)
Chloride: 87 mmol/L — ABNORMAL LOW (ref 98–111)
Creatinine, Ser: 1.03 mg/dL (ref 0.61–1.24)
GFR calc Af Amer: >60 mL/min (ref >60)
GFR calc non Af Amer: >60 mL/min (ref >60)
Glucose, Bld: 115 mg/dL — ABNORMAL HIGH (ref 70–99)
Potassium: 4.1 mmol/L (ref 3.5–5.1)
Sodium: 128 mmol/L — ABNORMAL LOW (ref 135–145)

## 2019-05-11 LAB — PROTIME-INR
INR: 2.1 — ABNORMAL HIGH (ref 0.8–1.2)
Prothrombin Time: 23 seconds — ABNORMAL HIGH (ref 11.4–15.2)

## 2019-05-11 LAB — CBC WITH DIFFERENTIAL/PLATELET
Abs Immature Granulocytes: 0.01 10*3/uL (ref 0.00–0.07)
Basophils Absolute: 0 10*3/uL (ref 0.0–0.1)
Basophils Relative: 0 %
Eosinophils Absolute: 0.1 10*3/uL (ref 0.0–0.5)
Eosinophils Relative: 2 %
HCT: 35.4 % — ABNORMAL LOW (ref 39.0–52.0)
Hemoglobin: 11.2 g/dL — ABNORMAL LOW (ref 13.0–17.0)
Immature Granulocytes: 0 %
Lymphocytes Relative: 32 %
Lymphs Abs: 1.7 10*3/uL (ref 0.7–4.0)
MCH: 25.6 pg — ABNORMAL LOW (ref 26.0–34.0)
MCHC: 31.6 g/dL (ref 30.0–36.0)
MCV: 81 fL (ref 80.0–100.0)
Monocytes Absolute: 1 10*3/uL (ref 0.1–1.0)
Monocytes Relative: 19 %
Neutro Abs: 2.5 10*3/uL (ref 1.7–7.7)
Neutrophils Relative %: 47 %
Platelets: 172 10*3/uL (ref 150–400)
RBC: 4.37 MIL/uL (ref 4.22–5.81)
RDW: 16.9 % — ABNORMAL HIGH (ref 11.5–15.5)
WBC: 5.2 10*3/uL (ref 4.0–10.5)
nRBC: 0 % (ref 0.0–0.2)

## 2019-05-11 MED ORDER — FUROSEMIDE 10 MG/ML IJ SOLN
80.0000 mg | Freq: Two times a day (BID) | INTRAMUSCULAR | Status: DC
  Administered 2019-05-11 – 2019-05-12 (×2): 80 mg via INTRAVENOUS
  Filled 2019-05-11 (×2): qty 8

## 2019-05-11 MED ORDER — WARFARIN SODIUM 7.5 MG PO TABS
7.5000 mg | ORAL_TABLET | Freq: Once | ORAL | Status: AC
  Administered 2019-05-11: 7.5 mg via ORAL
  Filled 2019-05-11: qty 1

## 2019-05-11 MED ORDER — LOSARTAN POTASSIUM 25 MG PO TABS
12.5000 mg | ORAL_TABLET | Freq: Every day | ORAL | Status: DC
  Administered 2019-05-11 – 2019-05-13 (×3): 12.5 mg via ORAL
  Filled 2019-05-11 (×3): qty 1

## 2019-05-11 NOTE — Plan of Care (Signed)
  Problem: Clinical Measurements: Goal: Ability to maintain clinical measurements within normal limits will improve Outcome: Progressing   Problem: Clinical Measurements: Goal: Will remain free from infection Outcome: Progressing   Problem: Clinical Measurements: Goal: Diagnostic test results will improve Outcome: Progressing   Problem: Nutrition: Goal: Adequate nutrition will be maintained Outcome: Progressing   

## 2019-05-11 NOTE — Progress Notes (Signed)
[place] for Coumadin Indication: h/o LV thrombus  Allergies  Allergen Reactions  . Clindamycin/Lincomycin Swelling and Palpitations  . Hydrocodone Hives  . Lisinopril Swelling and Other (See Comments)    Facial swelling/angioedema  . Olopatadine Hcl Swelling    Makes his face swell up  . Prednisone Shortness Of Breath, Nausea Only, Swelling and Other (See Comments)    Also made chest feel tight and genital area, legs, and face became swollen badly  . Bactrim [Sulfamethoxazole-Trimethoprim] Other (See Comments)    unknown  . Penicillins Hives and Swelling     Has patient had a PCN reaction causing immediate rash, facial/tongue/throat swelling, SOB or lightheadedness with hypotension: Yes Has patient had a PCN reaction causing severe rash involving mucus membranes or skin necrosis: No Has patient had a PCN reaction that required hospitalization: No Has patient had a PCN reaction occurring within the last 10 years: No If all of the above answers are "NO", then may proceed with Cephalosporin use.     Patient Measurements: Height: 5\' 9"  (175.3 cm) Weight: 134 lb 7.7 oz (61 kg) IBW/kg (Calculated) : 70.7  Vital Signs: Temp: 97.9 F (36.6 C) (09/19 0803) Temp Source: Oral (09/19 0803) BP: 110/78 (09/19 0803) Pulse Rate: 104 (09/19 0926)  Labs: Recent Labs    05/08/19 2018  05/08/19 2339  05/09/19 0505 05/10/19 0555 05/11/19 0544  HGB 12.1*   < >  --   --  11.5* 11.0* 11.2*  HCT 37.3*   < >  --   --  33.5* 34.4* 35.4*  PLT 186  --   --   --  180 179 172  LABPROT  --   --  34.8*   < > 28.2* 24.7* 23.0*  INR  --   --  3.5*   < > 2.7* 2.3* 2.1*  CREATININE 1.19  --   --   --  1.19 1.05 1.03  TROPONINIHS 39*  --  44*  --   --   --   --    < > = values in this interval not displayed.    Estimated Creatinine Clearance: 78.1 mL/min (by C-G formula based on SCr of 1.03 mg/dL).   Medical History: Past Medical History:  Diagnosis Date   . Asthma   . Chronic systolic CHF (congestive heart failure) ([place])   . Cigarette smoker   . CKD (chronic kidney disease), stage II    Archie Endo 10/01/2017  . COPD (chronic obstructive pulmonary disease) ([place]) 10/21/2017   on CT scan chest  . History of echocardiogram    a. Echo 5/17 - EF 20-25%, severe diff HK, restrictive physiology, mild to mod MR, severe reduced RVSF, mod RVE, mild RAE, mod TR, PASP 48 mmHg  . Hx of cardiac cath    a. LHC 5/17 - normal coronary arteries. PA 45/25, mean 33, PCWP mean 18  . NICM (nonischemic cardiomyopathy) ([place])   . Stroke ([place]) 09/27/2017   "was weak on my left side; I'm fully recovered" (11/09/2017)  . Substance abuse ([place])    cocaine, marijuana    Assessment: 43yo male admitted w/ acute on chronic CHF w/ cardiogenic shock, to continue Coumadin for h/o LV thrombus. -INR= 2.1, trending down so will increase dose tonight x1.  -CBC stable  Home coumadin dose (per clinic records)- coumadin dose was 5mg /day except take 2.5mg  on Tuesday  Goal of Therapy:  INR 2-3   Plan:  -Coumadin 7.5mg  po today -Daily PT/INR  Pilar Plate  Andrey Campanile PharmD., BCPS Clinical Pharmacist 05/11/2019 10:37 AM

## 2019-05-11 NOTE — Progress Notes (Signed)
Patient ID: Mark [name], male   DOB: 10/11/1975, 43 y.o.   MRN: [id number]     Advanced Heart Failure Rounding Note  PCP-Cardiologist: Lauree Chandler, MD   Subjective:    Admitted with recurrent cardiogenic shock. Started milrinone, currently at 0.25 mcg/kg/min.  Co-ox low this early am at 33%.  CVP 12-14.   I/Os negative, weight down 2 lbs.   Has not been out of bed.  Feels "ok."    Objective:   Weight Range: 61 kg Body mass index is 19.86 kg/m.   Vital Signs:   Temp:  [97.9 F (36.6 C)-98.5 F (36.9 C)] 97.9 F (36.6 C) (09/19 0803) Pulse Rate:  [37-118] 104 (09/19 0926) Resp:  [14-24] 16 (09/19 0803) BP: (98-113)/(78-98) 110/78 (09/19 0803) SpO2:  [92 %-100 %] 97 % (09/19 0807) Weight:  [61 kg] 61 kg (09/19 0402) Last BM Date: 05/09/19  Weight change: Filed Weights   05/08/19 1938 05/10/19 0349 05/11/19 0402  Weight: 65.3 kg 61.9 kg 61 kg    Intake/Output:   Intake/Output Summary (Last 24 hours) at 05/11/2019 1021 Last data filed at 05/11/2019 0935 Gross per 24 hour  Intake 1750.29 ml  Output 3325 ml  Net -1574.71 ml      Physical Exam   CVP 12-14.  General: NAD Neck: JVP 12 cm, no thyromegaly or thyroid nodule.  Lungs: Clear to auscultation bilaterally with normal respiratory effort. CV: Lateral PMI.  Heart regular S1/S2, no S3/S4, 3/6 HSM apex.  Trace ankle edema.   Abdomen: Soft, nontender, no hepatosplenomegaly, no distention.  Skin: Intact without lesions or rashes.  Neurologic: Alert and oriented x 3.  Psych: Normal affect. Extremities: No clubbing or cyanosis.  HEENT: Normal.    Telemetry   Sinus Tach 100s personally reviewed.    EKG    N/A   Labs    CBC Recent Labs    05/10/19 0555 05/11/19 0544  WBC 4.2 5.2  NEUTROABS 2.2 2.5  HGB 11.0* 11.2*  HCT 34.4* 35.4*  MCV 80.9 81.0  PLT 179 427   Basic Metabolic Panel Recent Labs    05/10/19 0555 05/11/19 0544  NA 132* 128*  K 3.0* 4.1  CL 89* 87*  CO2 33* 31   GLUCOSE 147* 115*  BUN 14 17  CREATININE 1.05 1.03  CALCIUM 8.4* 8.9   Liver Function Tests Recent Labs    05/08/19 2018  AST 131*  ALT 147*  ALKPHOS 138*  BILITOT 3.2*  PROT 7.2  ALBUMIN 3.2*   No results for input(s): LIPASE, AMYLASE in the last 72 hours. Cardiac Enzymes No results for input(s): CKTOTAL, CKMB, CKMBINDEX, TROPONINI in the last 72 hours.  BNP: BNP (last 3 results) Recent Labs    01/24/19 1955 02/13/19 2243 05/08/19 2018  BNP 2,792.5* 3,902.9* 3,017.6*    ProBNP (last 3 results) No results for input(s): PROBNP in the last 8760 hours.   D-Dimer No results for input(s): DDIMER in the last 72 hours. Hemoglobin A1C No results for input(s): HGBA1C in the last 72 hours. Fasting Lipid Panel No results for input(s): CHOL, HDL, LDLCALC, TRIG, CHOLHDL, LDLDIRECT in the last 72 hours. Thyroid Function Tests No results for input(s): TSH, T4TOTAL, T3FREE, THYROIDAB in the last 72 hours.  Invalid input(s): FREET3  Other results:   Imaging    No results found.   Medications:     Scheduled Medications: . Chlorhexidine Gluconate Cloth  6 each Topical Daily  . digoxin  0.125 mg Oral Daily  .  ferrous sulfate  325 mg Oral Daily  . furosemide  80 mg Intravenous BID  . losartan  12.5 mg Oral Daily  . mometasone-formoterol  2 puff Inhalation BID  . potassium chloride  40 mEq Oral Daily  . potassium chloride  40 mEq Oral Daily  . sodium chloride flush  10-40 mL Intracatheter Q12H  . sodium chloride flush  3 mL Intravenous Q12H  . spironolactone  25 mg Oral Daily  . Warfarin - Pharmacist Dosing Inpatient   Does not apply q1800    Infusions: . sodium chloride    . milrinone 0.25 mcg/kg/min (05/11/19 0530)    PRN Medications: sodium chloride, acetaminophen, albuterol, ondansetron (ZOFRAN) IV, sodium chloride flush, sodium chloride flush, traMADol    Patient Profile  Lelend C Hayesis a 43 y.o.malewith a history ofchronic systolic CHF (EF  10-15% with severe diffuse hypokinesis5/2020,NICM by cardiac cath 2017, history of right MCA CVA 2019, asthma, andpolysubstance use.  Admitted with recurrent cardiogenic shock.   Assessment/Plan   1. Acute on chronic systolic CHF/cardiogenic shock:  History of nonischemic cardiomyopathy.  Las echo from 5/20 showed that EF is markedly low at 10% with LV apical thrombus. The RV is severely dysfunctional as well.  RHC in 8/20 with low output and high filling pressures, he was on milrinone during 8/20 admission but insisted on going home for funeral.  On re-admit he was markedly overloaded with cool extremities and elevated lactate, low output with co-ox in 30s.  Started on milrinone and diuresed with IV Lasix.  This morning, he is on milrinone 0.25 (decreased from 0.375 yesterday) and po Lasix.  Co-ox has dropped back down to 33% and CVP is 12-14.  - Increase milrinone back to 0.375.  Repeat co-ox.  - Stop po Lasix, restart Lasix at 80 mg IV bid for now. - Can continue digoxin and spironolactone.  - Add losartan 12.5 mg daily.   - End stage biventricular cardiomyopathy.  Not sure there are great options here.  His RV is quite dysfunctional on echo and LFTs are elevated.  RV dysfunction and social issues will be a barrier to LVAD. Also, he does not believe his heart is weak. He is adamant that shortness of breath is from nasal congestion.  Plan now is for consideration of home on palliative milrinone gtt. I suspect that it will be hard for him to wean off milrinone.  2. LV thrombus: Noted on 5/20 echo.  - Continue warfarin.  3. Elevated LFTs: Suspect this is due to RV failure/congestive hepatopathy.  4. History of substance abuse: Cocaine use as recently as 6/20. UDS negative on admit.  5. Hypokalemia- Supp K   [name], MD  10:21 AM  05/11/2019

## 2019-05-12 DIAGNOSIS — Z7189 Other specified counseling: Secondary | ICD-10-CM

## 2019-05-12 DIAGNOSIS — R945 Abnormal results of liver function studies: Secondary | ICD-10-CM

## 2019-05-12 DIAGNOSIS — R0602 Shortness of breath: Secondary | ICD-10-CM

## 2019-05-12 DIAGNOSIS — Z515 Encounter for palliative care: Secondary | ICD-10-CM

## 2019-05-12 LAB — BASIC METABOLIC PANEL
Anion gap: 10 (ref 5–15)
BUN: 23 mg/dL — ABNORMAL HIGH (ref 6–20)
CO2: 30 mmol/L (ref 22–32)
Calcium: 9.2 mg/dL (ref 8.9–10.3)
Chloride: 87 mmol/L — ABNORMAL LOW (ref 98–111)
Creatinine, Ser: 1.07 mg/dL (ref 0.61–1.24)
GFR calc Af Amer: >60 mL/min (ref >60)
GFR calc non Af Amer: >60 mL/min (ref >60)
Glucose, Bld: 117 mg/dL — ABNORMAL HIGH (ref 70–99)
Potassium: 4.7 mmol/L (ref 3.5–5.1)
Sodium: 127 mmol/L — ABNORMAL LOW (ref 135–145)

## 2019-05-12 LAB — PROTIME-INR
INR: 2.3 — ABNORMAL HIGH (ref 0.8–1.2)
Prothrombin Time: 25.1 seconds — ABNORMAL HIGH (ref 11.4–15.2)

## 2019-05-12 LAB — CBC WITH DIFFERENTIAL/PLATELET
Abs Immature Granulocytes: 0.02 10*3/uL (ref 0.00–0.07)
Basophils Absolute: 0 10*3/uL (ref 0.0–0.1)
Basophils Relative: 0 %
Eosinophils Absolute: 0.1 10*3/uL (ref 0.0–0.5)
Eosinophils Relative: 2 %
HCT: 35.8 % — ABNORMAL LOW (ref 39.0–52.0)
Hemoglobin: 11.4 g/dL — ABNORMAL LOW (ref 13.0–17.0)
Immature Granulocytes: 0 %
Lymphocytes Relative: 33 %
Lymphs Abs: 1.7 10*3/uL (ref 0.7–4.0)
MCH: 25.8 pg — ABNORMAL LOW (ref 26.0–34.0)
MCHC: 31.8 g/dL (ref 30.0–36.0)
MCV: 81 fL (ref 80.0–100.0)
Monocytes Absolute: 0.8 10*3/uL (ref 0.1–1.0)
Monocytes Relative: 17 %
Neutro Abs: 2.4 10*3/uL (ref 1.7–7.7)
Neutrophils Relative %: 48 %
Platelets: 175 10*3/uL (ref 150–400)
RBC: 4.42 MIL/uL (ref 4.22–5.81)
RDW: 17.2 % — ABNORMAL HIGH (ref 11.5–15.5)
WBC: 5.1 10*3/uL (ref 4.0–10.5)
nRBC: 0 % (ref 0.0–0.2)

## 2019-05-12 LAB — COOXEMETRY PANEL
Carboxyhemoglobin: 1.1 % (ref 0.5–1.5)
Methemoglobin: 0.7 % (ref 0.0–1.5)
O2 Saturation: 51.5 %
Total hemoglobin: 11.6 g/dL — ABNORMAL LOW (ref 12.0–16.0)

## 2019-05-12 MED ORDER — FUROSEMIDE 10 MG/ML IJ SOLN
80.0000 mg | Freq: Two times a day (BID) | INTRAMUSCULAR | Status: AC
  Administered 2019-05-12: 17:00:00 80 mg via INTRAVENOUS
  Filled 2019-05-12: qty 8

## 2019-05-12 MED ORDER — WARFARIN SODIUM 5 MG PO TABS
5.0000 mg | ORAL_TABLET | Freq: Once | ORAL | Status: AC
  Administered 2019-05-12: 17:00:00 5 mg via ORAL
  Filled 2019-05-12: qty 1

## 2019-05-12 NOTE — Progress Notes (Signed)
[place] for Coumadin Indication: h/o LV thrombus  Allergies  Allergen Reactions  . Clindamycin/Lincomycin Swelling and Palpitations  . Hydrocodone Hives  . Lisinopril Swelling and Other (See Comments)    Facial swelling/angioedema  . Olopatadine Hcl Swelling    Makes his face swell up  . Prednisone Shortness Of Breath, Nausea Only, Swelling and Other (See Comments)    Also made chest feel tight and genital area, legs, and face became swollen badly  . Bactrim [Sulfamethoxazole-Trimethoprim] Other (See Comments)    unknown  . Penicillins Hives and Swelling     Has patient had a PCN reaction causing immediate rash, facial/tongue/throat swelling, SOB or lightheadedness with hypotension: Yes Has patient had a PCN reaction causing severe rash involving mucus membranes or skin necrosis: No Has patient had a PCN reaction that required hospitalization: No Has patient had a PCN reaction occurring within the last 10 years: No If all of the above answers are "NO", then may proceed with Cephalosporin use.     Patient Measurements: Height: 5\' 9"  (175.3 cm) Weight: 134 lb 14.7 oz (61.2 kg) IBW/kg (Calculated) : 70.7  Vital Signs: Temp: 98 F (36.7 C) (09/20 1100) Temp Source: Oral (09/20 1100) BP: 92/62 (09/20 0750) Pulse Rate: 91 (09/20 0832)  Labs: Recent Labs    05/10/19 0555 05/11/19 0544 05/12/19 0423 05/12/19 0444  HGB 11.0* 11.2* 11.4*  --   HCT 34.4* 35.4* 35.8*  --   PLT 179 172 175  --   LABPROT 24.7* 23.0*  --  25.1*  INR 2.3* 2.1*  --  2.3*  CREATININE 1.05 1.03 1.07  --     Estimated Creatinine Clearance: 75.5 mL/min (by C-G formula based on SCr of 1.07 mg/dL).   Medical History: Past Medical History:  Diagnosis Date  . Asthma   . Chronic systolic CHF (congestive heart failure) ([place])   . Cigarette smoker   . CKD (chronic kidney disease), stage II    Archie Endo 10/01/2017  . COPD (chronic obstructive pulmonary disease)  ([name]) 10/21/2017   on CT scan chest  . History of echocardiogram    a. Echo 5/17 - EF 20-25%, severe diff HK, restrictive physiology, mild to mod MR, severe reduced RVSF, mod RVE, mild RAE, mod TR, PASP 48 mmHg  . Hx of cardiac cath    a. LHC 5/17 - normal coronary arteries. PA 45/25, mean 33, PCWP mean 18  . NICM (nonischemic cardiomyopathy) ([place])   . Stroke ([place]) 09/27/2017   "was weak on my left side; I'm fully recovered" (11/09/2017)  . Substance abuse ([place])    cocaine, marijuana    Assessment: 43yo male admitted w/ acute on chronic CHF w/ cardiogenic shock, to continue Coumadin for h/o LV thrombus. -INR= 2.3  -CBC stable  Home coumadin dose (per clinic records)- coumadin dose was 5mg /day except take 2.5mg  on Tuesday  Goal of Therapy:  INR 2-3   Plan:  -Coumadin 5mg  po today -Daily PT/INR  Erin Hearing PharmD., BCPS Clinical Pharmacist 05/12/2019 11:29 AM

## 2019-05-12 NOTE — Consult Note (Signed)
Consultation Note Date: 05/12/2019   Patient Name: Mark [name]  DOB: [birth year]  MRN: [id number]  Age / Sex: 43 y.o., male   PCP: Kerin Perna, NP Referring Physician: Larey Dresser, MD   REASON FOR CONSULTATION:Establishing goals of care  Palliative Care consult requested for this 43 y.o. male with multiple medical problems including chronic systolic CHF (EF 25-05% with severe diffuse hypokinesis5/2020,cardiogenic shock, NICM by cardiac cath 2017, right MCA CVA 2019, asthma, andpolysubstance use. He presented to ED with complaints of dyspnea. During his ED work-up noted to have some shortness of breath, tachypneic, and orthopneic. Chest x-ray showed cardiomegaly without overt edema. Sinus tachycardia on ECG. BNP 3017. Since admission patient has been started on milrinone drip and IV lasix.   Clinical Assessment and Goals of Care: I have reviewed medical records including lab results, imaging, Epic notes, and MAR, received report from the bedside RN, and assessed the patient. I met at the bedside with patient to discuss diagnosis prognosis, GOC, EOL wishes, disposition and options. He is awake, alert and oriented. He denies pain or shortness of breath.   Patient has been seen by our Palliative Medicine team on previous admissions. He acknowledges remembrance of meetings. I re-introduced Palliative Medicine as specialized medical care for people living with serious illness. It focuses on providing relief from the symptoms and stress of a serious illness. The goal is to improve quality of life for both the patient and the family.  We discussed a brief life review of the patient, along with his functional and nutritional status.  [name] reports he is unemployed and have tried to receive disability on multiple occassions yet denied. He lives with his fiance, daughter, and 2 of her children. He reports he was previously married and has 2 other children however, he spent several  years in prison (released in 2017) which ended his marriage.   Prior to admission he reports he was able to perform all ADLs without assistance. Does endorse shortness of breath at times and the inability to walk long distance without taking breaks due to his breathing and fatigue. States he does not follow a specific diet.   We discussed His current illness and what it means in the larger context of His on-going co-morbidities. With specific discussions regarding his end-stage CHF, requirements for IV milrinone, and his overall functional state.  Natural disease trajectory and expectations at EOL were discussed. I discussed with patient at length his current illness, prognosis, and symptoms.   [name] verbalized understanding and was able to express his discussions with the cardiology team. He verbalized awareness of his EF being less than 10%, awareness of his requirement of medications, and also stated "I guess I have to go home with this medication. What other choice do I have. I am going to die at some point but at least maybe this will give me some time!" He states he feels his shortness of breath and congestion is coming from a nasal sore area from were he was using alcohol to clean and sanitize it after having a bad cold. I educated  him that his congestion and shortness of breath was coming from his poor cardiac function and even without the nasal sore these symptoms would be present. He verbalized understanding but also remain unrealistic stating "I don't think so!"   I asked patient has he talked to his family and fiance about his condition and shared his critical state and poor prognosis. He  reports he has spoken to his fiance Garald Braver about it and she remains hopeful with him. I offered to discuss further with her and he declined stating "I can tell her what she needs to know!"   I educated patient on his poor prognosis and even with continued medications his health will continue to  decline as his heart would only be able to take so much more, also emphasizing options of him doing his part with lifestyle choices versus not and being kept in a more comfortable state and doing as he chooses with no limitations. He verbalized "I am going to do what I want either way, no question!"  I attempted to elicit values and goals of care important to the patient.    The difference between aggressive medical intervention and comfort care was considered in light of the patient's goals of care.   Patient reports he completed an advanced directive during his last admission and his fiance, Garald Braver was documented as his HCPOA. I discussed with him at length about his current full code status with strong consideration of his current cardiac condition. He verbalized understanding and continued to state, "I want everything done. How will yall know I wont make it if you don't try!" I educated patient on poor overall cardiac function and in the event of a cardiac arrest he had no reserve of muscle function/strength to restart his heart.   He became tearful. Silence allowed. Encouraged patient to discuss with me how he felt about hearing this. He stated "it is what it is. I guess I am just going to go home and whatever from there.!" I educated him on hospice support versus palliative support and their goals/mission. Attempted to explain to him that he could go home and be with family with hospice support, being kept comfortable with medications to assist with symptom management such as pain, shortness of breath, secretions, anxiety etc. He stated "can I have them for pain medication and that's all" Educated patient that he could not continue with aggressive treatments such as milrinone and have hospice support. He stated he was not interested in hospice at this time but would agree to outpatient palliative. He does endorse he declined previously.   Patient states he wished to have all interventions.  Would like to remain a full code again stating "my fiance can make a decision, if I don't just die at home!"   Questions and concerns were addressed.  Hard Choices booklet left for review. The family was encouraged to call with questions or concerns.  PMT will continue to support holistically.   SOCIAL HISTORY:     reports that he quit smoking about 19 months ago. His smoking use included cigarettes. He has a 30.00 pack-year smoking history. He has never used smokeless tobacco. He reports current alcohol use of about 5.0 standard drinks of alcohol per week. He reports current drug use. Drugs: Cocaine and Marijuana.  CODE STATUS: Full code  ADVANCE DIRECTIVES: Fiance, [name] Foust (fiance)    SYMPTOM MANAGEMENT: per attending   Palliative Prophylaxis:   Frequent Pain Assessment  PSYCHO-SOCIAL/SPIRITUAL:  Support System: Family   Desire for further Chaplaincy support: Declined   Additional Recommendations (Limitations, Scope, Preferences):  Full Scope Treatment   PAST MEDICAL HISTORY: Past Medical History:  Diagnosis Date  . Asthma   . Chronic systolic CHF (congestive heart failure) ([place])   . Cigarette smoker   . CKD (chronic kidney disease), stage II    Archie Endo 10/01/2017  .  COPD (chronic obstructive pulmonary disease) ([place]) 10/21/2017   on CT scan chest  . History of echocardiogram    a. Echo 5/17 - EF 20-25%, severe diff HK, restrictive physiology, mild to mod MR, severe reduced RVSF, mod RVE, mild RAE, mod TR, PASP 48 mmHg  . Hx of cardiac cath    a. LHC 5/17 - normal coronary arteries. PA 45/25, mean 33, PCWP mean 18  . NICM (nonischemic cardiomyopathy) ([place])   . Stroke ([place]) 09/27/2017   "was weak on my left side; I'm fully recovered" (11/09/2017)  . Substance abuse ([place])    cocaine, marijuana    PAST SURGICAL HISTORY:  Past Surgical History:  Procedure Laterality Date  . CARDIAC CATHETERIZATION N/A 01/05/2016   Procedure: Right/Left Heart Cath and Coronary  Angiography;  Surgeon: Troy Sine, MD;  Location: [facility];  Service: Cardiovascular;  Laterality: N/A;  . MULTIPLE EXTRACTIONS WITH ALVEOLOPLASTY Bilateral 04/11/2019   Procedure: Extraction of tooth #'s 2, 4, 5, 10, and 12 with alveoloplasty;  Surgeon: Lenn Cal, DDS;  Location: [facility];  Service: Oral Surgery;  Laterality: Bilateral;  . RIGHT HEART CATH N/A 04/09/2019   Procedure: RIGHT HEART CATH;  Surgeon: Jolaine Artist, MD;  Location: [facility];  Service: Cardiovascular;  Laterality: N/A;    ALLERGIES:  is allergic to clindamycin/lincomycin; hydrocodone; lisinopril; olopatadine hcl; prednisone; bactrim [sulfamethoxazole-trimethoprim]; and penicillins.   MEDICATIONS:  Current Facility-Administered Medications  Medication Dose Route Frequency Provider Last Rate Last Dose  . 0.9 %  sodium chloride infusion  250 mL Intravenous PRN Larey Dresser, MD      . acetaminophen (TYLENOL) tablet 650 mg  650 mg Oral Q4H PRN Larey Dresser, MD      . albuterol (PROVENTIL) (2.5 MG/3ML) 0.083% nebulizer solution 3 mL  3 mL Inhalation Q4H PRN Larey Dresser, MD      . Chlorhexidine Gluconate Cloth 2 % PADS 6 each  6 each Topical Daily Larey Dresser, MD   6 each at 05/12/19 1100  . digoxin (LANOXIN) tablet 0.125 mg  0.125 mg Oral Daily Larey Dresser, MD   0.125 mg at 05/12/19 1059  . ferrous sulfate tablet 325 mg  325 mg Oral Daily Larey Dresser, MD   325 mg at 05/12/19 1059  . furosemide (LASIX) injection 80 mg  80 mg Intravenous BID Larey Dresser, MD      . losartan (COZAAR) tablet 12.5 mg  12.5 mg Oral Daily Larey Dresser, MD   12.5 mg at 05/12/19 1059  . milrinone (PRIMACOR) 20 MG/100 ML (0.2 mg/mL) infusion  0.375 mcg/kg/min Intravenous Continuous Larey Dresser, MD 7.35 mL/hr at 05/12/19 1105 0.375 mcg/kg/min at 05/12/19 1105  . mometasone-formoterol (DULERA) 100-5 MCG/ACT inhaler 2 puff  2 puff Inhalation BID Larey Dresser, MD   2 puff at  05/12/19 0830  . ondansetron (ZOFRAN) injection 4 mg  4 mg Intravenous Q6H PRN Larey Dresser, MD      . potassium chloride SA (K-DUR) CR tablet 40 mEq  40 mEq Oral Daily Larey Dresser, MD   40 mEq at 05/12/19 1059  . potassium chloride SA (K-DUR) CR tablet 40 mEq  40 mEq Oral Daily Clegg, Amy D, NP   40 mEq at 05/12/19 1100  . sodium chloride flush (NS) 0.9 % injection 10-40 mL  10-40 mL Intracatheter Q12H Larey Dresser, MD   10 mL at 05/12/19 1100  . sodium chloride  flush (NS) 0.9 % injection 10-40 mL  10-40 mL Intracatheter PRN Larey Dresser, MD      . sodium chloride flush (NS) 0.9 % injection 3 mL  3 mL Intravenous Q12H Larey Dresser, MD   3 mL at 05/12/19 1100  . sodium chloride flush (NS) 0.9 % injection 3 mL  3 mL Intravenous PRN Larey Dresser, MD      . spironolactone (ALDACTONE) tablet 25 mg  25 mg Oral Daily Larey Dresser, MD   25 mg at 05/12/19 1059  . traMADol (ULTRAM) tablet 50 mg  50 mg Oral Q6H PRN Clegg, Amy D, NP      . warfarin (COUMADIN) tablet 5 mg  5 mg Oral ONCE-1800 Lyndee Leo, [facility]      . Warfarin - Pharmacist Dosing Inpatient   Does not apply q1800 Laren Everts, [facility]        VITAL SIGNS: BP 95/66 (BP Location: Left Arm)   Pulse 95   Temp (!) 97.5 F (36.4 C) (Oral)   Resp 19   Ht 5' 9" (1.753 m)   Wt 61.2 kg   SpO2 97%   BMI 19.92 kg/m  Filed Weights   05/10/19 0349 05/11/19 0402 05/12/19 0403  Weight: 61.9 kg 61 kg 61.2 kg    Estimated body mass index is 19.92 kg/m as calculated from the following:   Height as of this encounter: 5' 9" (1.753 m).   Weight as of this encounter: 61.2 kg.  LABS: CBC:    Component Value Date/Time   WBC 5.1 05/12/2019 0423   HGB 11.4 (L) 05/12/2019 0423   HGB 12.8 (L) 12/01/2017 1000   HCT 35.8 (L) 05/12/2019 0423   HCT 41.1 12/01/2017 1000   PLT 175 05/12/2019 0423   PLT 188 12/01/2017 1000   Comprehensive Metabolic Panel:    Component Value Date/Time   NA 127 (L) 05/12/2019 0423   NA  143 12/01/2017 1000   K 4.7 05/12/2019 0423   CO2 30 05/12/2019 0423   BUN 23 (H) 05/12/2019 0423   BUN 18 12/01/2017 1000   CREATININE 1.07 05/12/2019 0423   ALBUMIN 3.2 (L) 05/08/2019 2018     Review of Systems  Constitutional: Positive for fatigue.  Respiratory: Positive for cough and shortness of breath.     Physical Exam General: NAD, frail chronically-ill appearing Cardiovascular: regular rate and rhythm Pulmonary: clear ant fields, congested cough Abdomen: soft, nontender, + bowel sounds Extremities: trace bilateral ankle edema, no joint deformities Skin: no rashes, warm, dry Neurological: flat affect, A&O x3   Prognosis: < 6 months in the setting of end-stage biventricular cardiomyopathy, chronic systolic CHF, cardiogenic shock, EF 10%, poor health maintenance, history of polysubstance abuse, shortness of breath, fatigue.    Discharge Planning:  Home with Home Health and outpatient Palliative.   Recommendations:  Full Code-patient request to remain a full code despite extensive education and discussion on his illness and poor prognosis  Continue with current plan of care per attending. Patient requesting full aggressive medical interventions. Able to verbalize prognosis and condition, however remains unrealistic/denial in expectations and seriousness of illness.   Patient is agreeable to outpatient palliative. This will be important if he continues to comply/agree with their support outpatient. They will be vital in continuing goals of care discussions and providing support. Patient most likely will continue to decline and will require EOL support sooner than later. May be beneficial also by being able to connect with family/fiance  in the home given he did not want to include her in hospital discussions and updates. He is hospice appropriate however, is not in agreement.   May benefit from home oxygen for shortness of breath and support.   PMT will continue to support  and follow.    Palliative Performance Scale: PPS 30%              Patient expressed understanding and was in agreement with this plan.   Thank you for allowing the Palliative Medicine Team to assist in the care of this patient.  Time In: 1400 Time Out: 1505 Time Total: 65 min.   Visit consisted of counseling and education dealing with the complex and emotionally intense issues of symptom management and palliative care in the setting of serious and potentially life-threatening illness.Greater than 50%  of this time was spent counseling and coordinating care related to the above assessment and plan.  Signed by:  Alda Lea, AGPCNP-BC Palliative Medicine Team  Phone: [phone] Fax: [phone] Pager: [phone] Amion: Bjorn Pippin

## 2019-05-12 NOTE — Progress Notes (Signed)
Pt ambulated in a hallway without SOB, distress and chest pain, CHG bath completed, vitals stable, Milrinone continue @0 .335mcg/kg/min.  Will continue to monitor the patient  Palma Holter, RN

## 2019-05-12 NOTE — Discharge Instructions (Signed)
Information on my medicine - Coumadin®   (Warfarin) ° ° °Why was Coumadin prescribed for you? °Coumadin was prescribed for you because you have a blood clot or a medical condition that can cause an increased risk of forming blood clots. Blood clots can cause serious health problems by blocking the flow of blood to the heart, lung, or brain. Coumadin can prevent harmful blood clots from forming. °As a reminder your indication for Coumadin is:   Blood clot in heart °What test will check on my response to Coumadin? °While on Coumadin (warfarin) you will need to have an INR test regularly to ensure that your dose is keeping you in the desired range. The INR (international normalized ratio) number is calculated from the result of the laboratory test called prothrombin time (PT). ° °If an INR APPOINTMENT HAS NOT ALREADY BEEN MADE FOR YOU please schedule an appointment to have this lab work done by your health care provider within 7 days. °Your INR goal is usually a number between:  2 to 3 or your provider may give you a more narrow range like 2-2.5.  Ask your health care provider during an office visit what your goal INR is. ° °What  do you need to  know  About  COUMADIN? °Take Coumadin (warfarin) exactly as prescribed by your healthcare provider about the same time each day.  DO NOT stop taking without talking to the doctor who prescribed the medication.  Stopping without other blood clot prevention medication to take the place of Coumadin may increase your risk of developing a new clot or stroke.  Get refills before you run out. ° °What do you do if you miss a dose? °If you miss a dose, take it as soon as you remember on the same day then continue your regularly scheduled regimen the next day.  Do not take two doses of Coumadin at the same time. ° °Important Safety Information °A possible side effect of Coumadin (Warfarin) is an increased risk of bleeding. You should call your healthcare provider right away if you  experience any of the following: °? Bleeding from an injury or your nose that does not stop. °? Unusual colored urine (red or dark brown) or unusual colored stools (red or black). °? Unusual bruising for unknown reasons. °? A serious fall or if you hit your head (even if there is no bleeding). ° °Some foods or medicines interact with Coumadin® (warfarin) and might alter your response to warfarin. To help avoid this: °? Eat a balanced diet, maintaining a consistent amount of Vitamin K. °? Notify your provider about major diet changes you plan to make. °? Avoid alcohol or limit your intake to 1 drink for women and 2 drinks for men per day. °(1 drink is 5 oz. wine, 12 oz. beer, or 1.5 oz. liquor.) ° °Make sure that ANY health care provider who prescribes medication for you knows that you are taking Coumadin (warfarin).  Also make sure the healthcare provider who is monitoring your Coumadin knows when you have started a new medication including herbals and non-prescription products. ° °Coumadin® (Warfarin)  Major Drug Interactions  °Increased Warfarin Effect Decreased Warfarin Effect  °Alcohol (large quantities) °Antibiotics (esp. Septra/Bactrim, Flagyl, Cipro) °Amiodarone (Cordarone) °Aspirin (ASA) °Cimetidine (Tagamet) °Megestrol (Megace) °NSAIDs (ibuprofen, naproxen, etc.) °Piroxicam (Feldene) °Propafenone (Rythmol SR) °Propranolol (Inderal) °Isoniazid (INH) °Posaconazole (Noxafil) Barbiturates (Phenobarbital) °Carbamazepine (Tegretol) °Chlordiazepoxide (Librium) °Cholestyramine (Questran) °Griseofulvin °Oral Contraceptives °Rifampin °Sucralfate (Carafate) °Vitamin K  ° °Coumadin® (Warfarin) Major Herbal Interactions  °  Increased Warfarin Effect Decreased Warfarin Effect  °Garlic °Ginseng °Ginkgo biloba Coenzyme Q10 °Green tea °St. John’s wort   ° °Coumadin® (Warfarin) FOOD Interactions  °Eat a consistent number of servings per week of foods HIGH in Vitamin K °(1 serving = ½ cup)  °Collards (cooked, or boiled &  drained) °Kale (cooked, or boiled & drained) °Mustard greens (cooked, or boiled & drained) °Parsley *serving size only = ¼ cup °Spinach (cooked, or boiled & drained) °Swiss chard (cooked, or boiled & drained) °Turnip greens (cooked, or boiled & drained)  °Eat a consistent number of servings per week of foods MEDIUM-HIGH in Vitamin K °(1 serving = 1 cup)  °Asparagus (cooked, or boiled & drained) °Broccoli (cooked, boiled & drained, or raw & chopped) °Brussel sprouts (cooked, or boiled & drained) *serving size only = ½ cup °Lettuce, raw (green leaf, endive, romaine) °Spinach, raw °Turnip greens, raw & chopped  ° °These websites have more information on Coumadin (warfarin):  www.coumadin.com; °www.ahrq.gov/consumer/coumadin.htm; ° ° ° °

## 2019-05-12 NOTE — Progress Notes (Signed)
Patient ID: Mark [name], male   DOB: 07/08/1976, 43 y.o.   MRN: [id number]     Advanced Heart Failure Rounding Note  PCP-Cardiologist: Verne Carrow, MD   Subjective:    Admitted with recurrent cardiogenic shock. Started milrinone, currently at 0.375 mcg/kg/min.  Co-ox somewhat better today on higher milrinone dose but still low at 52%.  CVP 8 today.  Do not think I/Os fully recorded yesterday.    He has not been out of bed.    Objective:   Weight Range: 61.2 kg Body mass index is 19.92 kg/m.   Vital Signs:   Temp:  [97.6 F (36.4 C)-98.2 F (36.8 C)] 98 F (36.7 C) (09/20 1100) Pulse Rate:  [86-114] 91 (09/20 0832) Resp:  [16-24] 18 (09/20 0832) BP: (92-112)/(62-85) 92/62 (09/20 0750) SpO2:  [98 %-100 %] 99 % (09/20 0832) Weight:  [61.2 kg] 61.2 kg (09/20 0403) Last BM Date: 05/11/19  Weight change: Filed Weights   05/10/19 0349 05/11/19 0402 05/12/19 0403  Weight: 61.9 kg 61 kg 61.2 kg    Intake/Output:   Intake/Output Summary (Last 24 hours) at 05/12/2019 1116 Last data filed at 05/12/2019 0800 Gross per 24 hour  Intake 1429.34 ml  Output 1525 ml  Net -95.66 ml      Physical Exam   CVP 8  General: NAD Neck: JVP 7-8 cm, no thyromegaly or thyroid nodule.  Lungs: Clear to auscultation bilaterally with normal respiratory effort. CV: Lateral PMI.  Heart regular S1/S2, no S3/S4, 3/6 HSM apex.  Trace ankle edema.   Abdomen: Soft, nontender, no hepatosplenomegaly, no distention.  Skin: Intact without lesions or rashes.  Neurologic: Alert and oriented x 3.  Psych: Normal affect. Extremities: No clubbing or cyanosis.  HEENT: Normal.     Telemetry   Sinus Tach 100s personally reviewed.    EKG    N/A   Labs    CBC Recent Labs    05/11/19 0544 05/12/19 0423  WBC 5.2 5.1  NEUTROABS 2.5 2.4  HGB 11.2* 11.4*  HCT 35.4* 35.8*  MCV 81.0 81.0  PLT 172 175   Basic Metabolic Panel Recent Labs    47/65/46 0544 05/12/19 0423  NA 128* 127*   K 4.1 4.7  CL 87* 87*  CO2 31 30  GLUCOSE 115* 117*  BUN 17 23*  CREATININE 1.03 1.07  CALCIUM 8.9 9.2   Liver Function Tests No results for input(s): AST, ALT, ALKPHOS, BILITOT, PROT, ALBUMIN in the last 72 hours. No results for input(s): LIPASE, AMYLASE in the last 72 hours. Cardiac Enzymes No results for input(s): CKTOTAL, CKMB, CKMBINDEX, TROPONINI in the last 72 hours.  BNP: BNP (last 3 results) Recent Labs    01/24/19 1955 02/13/19 2243 05/08/19 2018  BNP 2,792.5* 3,902.9* 3,017.6*    ProBNP (last 3 results) No results for input(s): PROBNP in the last 8760 hours.   D-Dimer No results for input(s): DDIMER in the last 72 hours. Hemoglobin A1C No results for input(s): HGBA1C in the last 72 hours. Fasting Lipid Panel No results for input(s): CHOL, HDL, LDLCALC, TRIG, CHOLHDL, LDLDIRECT in the last 72 hours. Thyroid Function Tests No results for input(s): TSH, T4TOTAL, T3FREE, THYROIDAB in the last 72 hours.  Invalid input(s): FREET3  Other results:   Imaging    No results found.   Medications:     Scheduled Medications: . Chlorhexidine Gluconate Cloth  6 each Topical Daily  . digoxin  0.125 mg Oral Daily  . ferrous sulfate  325 mg  Oral Daily  . furosemide  80 mg Intravenous BID  . losartan  12.5 mg Oral Daily  . mometasone-formoterol  2 puff Inhalation BID  . potassium chloride  40 mEq Oral Daily  . potassium chloride  40 mEq Oral Daily  . sodium chloride flush  10-40 mL Intracatheter Q12H  . sodium chloride flush  3 mL Intravenous Q12H  . spironolactone  25 mg Oral Daily  . Warfarin - Pharmacist Dosing Inpatient   Does not apply q1800    Infusions: . sodium chloride    . milrinone 0.375 mcg/kg/min (05/12/19 1105)    PRN Medications: sodium chloride, acetaminophen, albuterol, ondansetron (ZOFRAN) IV, sodium chloride flush, sodium chloride flush, traMADol    Patient Profile  Mark [name] a 43 y.o.malewith a history ofchronic  systolic CHF (EF 80-32% with severe diffuse hypokinesis5/2020,NICM by cardiac cath 2017, history of right MCA CVA 2019, asthma, andpolysubstance use.  Admitted with recurrent cardiogenic shock.   Assessment/Plan   1. Acute on chronic systolic CHF/cardiogenic shock:  History of nonischemic cardiomyopathy.  Las echo from 5/20 showed that EF is markedly low at 10% with LV apical thrombus. The RV is severely dysfunctional as well.  RHC in 8/20 with low output and high filling pressures, he was on milrinone during 8/20 admission but insisted on going home for funeral.  On re-admit he was markedly overloaded with cool extremities and elevated lactate, low output with co-ox in 30s.  Started on milrinone and diuresed with IV Lasix.  This morning, he is on milrinone 0.375 with co-ox 52% and CVP 8.  - Continue milrinone 0.375 mcg/kg/min.  He understands that he may have to go home on this.   - I will give him 1 more dose of IV Lasix this evening, can transition to po tomorrow. - Can continue digoxin and spironolactone.  - Continue losartan 12.5 mg daily, SBP in 90s so will not increase.   - End stage biventricular cardiomyopathy.  Not sure there are great options here.  His RV is quite dysfunctional on echo and LFTs are elevated.  RV dysfunction and social issues will be a barrier to LVAD. Plan now is for consideration of home on palliative milrinone gtt.  2. LV thrombus: Noted on 5/20 echo.  - Continue warfarin.  3. Elevated LFTs: Suspect this is due to RV failure/congestive hepatopathy.  4. History of substance abuse: Cocaine use as recently as 6/20. UDS negative on admit.  5. Hypokalemia- Supp K as needed.   Loralie Champagne, MD  11:16 AM  05/12/2019

## 2019-05-12 NOTE — Progress Notes (Addendum)
Pt had a 2 episodes of v-tach at different times, pt was asymptomatic, vitals stable, denies chest pain, pressure and SOB, will continue to monitor  Palma Holter, RN

## 2019-05-13 ENCOUNTER — Telehealth (INDEPENDENT_AMBULATORY_CARE_PROVIDER_SITE_OTHER): Payer: Self-pay

## 2019-05-13 LAB — CBC WITH DIFFERENTIAL/PLATELET
Abs Immature Granulocytes: 0.03 10*3/uL (ref 0.00–0.07)
Basophils Absolute: 0 10*3/uL (ref 0.0–0.1)
Basophils Relative: 1 %
Eosinophils Absolute: 0.1 10*3/uL (ref 0.0–0.5)
Eosinophils Relative: 2 %
HCT: 37.5 % — ABNORMAL LOW (ref 39.0–52.0)
Hemoglobin: 12.6 g/dL — ABNORMAL LOW (ref 13.0–17.0)
Immature Granulocytes: 1 %
Lymphocytes Relative: 30 %
Lymphs Abs: 1.6 10*3/uL (ref 0.7–4.0)
MCH: 26.6 pg (ref 26.0–34.0)
MCHC: 33.6 g/dL (ref 30.0–36.0)
MCV: 79.3 fL — ABNORMAL LOW (ref 80.0–100.0)
Monocytes Absolute: 0.8 10*3/uL (ref 0.1–1.0)
Monocytes Relative: 14 %
Neutro Abs: 2.9 10*3/uL (ref 1.7–7.7)
Neutrophils Relative %: 52 %
Platelets: 205 10*3/uL (ref 150–400)
RBC: 4.73 MIL/uL (ref 4.22–5.81)
RDW: 17 % — ABNORMAL HIGH (ref 11.5–15.5)
WBC: 5.4 10*3/uL (ref 4.0–10.5)
nRBC: 0 % (ref 0.0–0.2)

## 2019-05-13 LAB — BASIC METABOLIC PANEL
Anion gap: 9 (ref 5–15)
BUN: 29 mg/dL — ABNORMAL HIGH (ref 6–20)
CO2: 29 mmol/L (ref 22–32)
Calcium: 9.1 mg/dL (ref 8.9–10.3)
Chloride: 92 mmol/L — ABNORMAL LOW (ref 98–111)
Creatinine, Ser: 1.09 mg/dL (ref 0.61–1.24)
GFR calc Af Amer: >60 mL/min (ref >60)
GFR calc non Af Amer: >60 mL/min (ref >60)
Glucose, Bld: 109 mg/dL — ABNORMAL HIGH (ref 70–99)
Potassium: 4.6 mmol/L (ref 3.5–5.1)
Sodium: 130 mmol/L — ABNORMAL LOW (ref 135–145)

## 2019-05-13 LAB — COOXEMETRY PANEL
Carboxyhemoglobin: 1.6 % — ABNORMAL HIGH (ref 0.5–1.5)
Methemoglobin: 1 % (ref 0.0–1.5)
O2 Saturation: 63.1 %
Total hemoglobin: 12.4 g/dL (ref 12.0–16.0)

## 2019-05-13 LAB — PROTIME-INR
INR: 2.4 — ABNORMAL HIGH (ref 0.8–1.2)
Prothrombin Time: 25.4 seconds — ABNORMAL HIGH (ref 11.4–15.2)

## 2019-05-13 MED ORDER — LOSARTAN POTASSIUM 25 MG PO TABS: 12.5000 mg | ORAL_TABLET | Freq: Every day | ORAL | 6 refills | Status: DC

## 2019-05-13 MED ORDER — SPIRONOLACTONE 25 MG PO TABS: 25.0000 mg | ORAL_TABLET | Freq: Every day | ORAL | 6 refills | Status: DC

## 2019-05-13 MED ORDER — WARFARIN SODIUM 5 MG PO TABS: ORAL_TABLET | ORAL | 1 refills | Status: DC

## 2019-05-13 MED FILL — LOSARTAN POTASSIUM 25 MG TA: 25 | 30 days supply | Qty: 15 | Fill #0

## 2019-05-13 MED FILL — SPIRONOLACTONE 25 MG TABLET: 25 | 30 days supply | Qty: 30 | Fill #0

## 2019-05-13 NOTE — Progress Notes (Signed)
CSW has called both RN's for Palliative services today and lvm with both. Awaiting call back. CSW has informed patient's Heart Failure social worker Eliezer Lofts that patient will be discharged today and attempts are being made to set up with palliative services.   [place], [place]

## 2019-05-13 NOTE — Progress Notes (Signed)
Patient continues to call from his room demanding drinks and food. When asked why he was no sleeping patient stated he sleeps all day and stays up all night at home. Instructed patient to limit his fluid intake and patient got really upset with me stating he was not on any fluid restriction and demanded we take the sign off his door. "My doctor never told me anything about limiting my fluids. Hand me that bag right there. Patient pointed to his personal belongings bag and stated I'm going home today and my instructions are in that bag". Patient very upset stating " my doctor never told me any of that stuff". Patient offered anything he wanted to drink. Patient fired his day RN because she was trying to limit his fluid intake. Will continue to monitor this patient.

## 2019-05-13 NOTE — Discharge Summary (Signed)
Advanced Heart Failure Team  Discharge Summary   Patient ID: Mark [name] MRN: [id number], DOB/AGE: 1976-03-09 43 y.o. Admit date: 05/08/2019 D/C date:     05/13/2019   Primary Discharge Diagnoses:  1. Acute on chronic systolic CHF/cardiogenic shock:  2. LV thrombus: Noted on 5/20 echo.  3. Elevated LFTs:  4. History of substance abuse 5. Hypokalemia Hospital Course:  Mark [name] a 43 y.o.malewith a history ofchronic systolic CHF (EF 95-28% with severe diffuse hypokinesis5/2020,NICM by cardiac cath 2017, history of right MCA CVA 2019, asthma, andpolysubstance use.  Admitted with recurrent cardiogenic shock. Placed on milrinone and diuresed with IV lasix. Once diuresed he was transitioned to lasix 80 mg daily. Overall diuresed 9 pounds. Milrinone was turned off because he wanted to go home and get his affairs in order. He was referred to Palliative Care and understood he has limited time. He was not ready to transition to Hospice but was agreeable to outpatient Palliative Care. He was referred prior to discharge. He will continue to be followed in the HF clinic. Medications are being provided by the HF fund.   1. Acute on chronic systolic CHF/cardiogenic shock: History of nonischemic cardiomyopathy. Last echo from 5/20 showed that EF is markedly low at 10% with LV apical thrombus. The RV is severely dysfunctional as well. RHC in 8/20 with low output and high filling pressures, he was on milrinone during 8/20 admission but insisted on going home for funeral. On re-admit he was markedly overloaded with cool extremities and elevated lactate, low output with co-ox in 30s.  Started on milrinone and diuresed with IV Lasix.  On 9/21 he was adamant he was going home and did not want to go home with milrinone.  - Milrinone and PICC line stopped.   - Continue digoxin and spironolactone.  - Continue losartan 12.5 mg daily, SBP in 90s so will not increase.   - End stage biventricular  cardiomyopathy. His RV is quite dysfunctional on echo and LFTs are elevated.  RV dysfunction and social issues will be a barrier to LVAD.  - Palliative Care consulted for goals of care.   2. LV thrombus: Noted on 5/20 echo.  - Continue warfarin. INR will be followed at the coumadin clinic.  3. Elevated LFTs: Suspect this is due to RV failure/congestive hepatopathy.  4. History of substance abuse: Cocaine use as recently as 6/20. UDS negative on admit.  5. Hypokalemia- Supp K as needed.   Discharge Weight: 135 pounds.  Discharge Vitals: Blood pressure 111/88, pulse (!) 113, temperature 97.9 F (36.6 C), temperature source Oral, resp. rate 15, height 5\' 9"  (1.753 m), weight 61.4 kg, SpO2 97 %.  Labs: Lab Results  Component Value Date   WBC 5.4 05/13/2019   HGB 12.6 (L) 05/13/2019   HCT 37.5 (L) 05/13/2019   MCV 79.3 (L) 05/13/2019   PLT 205 05/13/2019    Recent Labs  Lab 05/08/19 2018  05/13/19 0500  NA 132*   < > 130*  K 4.0   < > 4.6  CL 98   < > 92*  CO2 21*   < > 29  BUN 27*   < > 29*  CREATININE 1.19   < > 1.09  CALCIUM 9.0   < > 9.1  PROT 7.2  --   --   BILITOT 3.2*  --   --   ALKPHOS 138*  --   --   ALT 147*  --   --   AST  131*  --   --   GLUCOSE 91   < > 109*   < > = values in this interval not displayed.   Lab Results  Component Value Date   CHOL 118 04/12/2019   HDL 40 (L) 04/12/2019   LDLCALC 69 04/12/2019   TRIG 43 04/12/2019   BNP (last 3 results) Recent Labs    01/24/19 1955 02/13/19 2243 05/08/19 2018  BNP 2,792.5* 3,902.9* 3,017.6*    ProBNP (last 3 results) No results for input(s): PROBNP in the last 8760 hours.   Diagnostic Studies/Procedures   No results found.  Discharge Medications   Allergies as of 05/13/2019      Reactions   Clindamycin/lincomycin Swelling, Palpitations   Hydrocodone Hives   Lisinopril Swelling, Other (See Comments)   Facial swelling/angioedema   Olopatadine Hcl Swelling   Makes his face swell up    Prednisone Shortness Of Breath, Nausea Only, Swelling, Other (See Comments)   Also made chest feel tight and genital area, legs, and face became swollen badly   Bactrim [sulfamethoxazole-trimethoprim] Other (See Comments)   unknown   Penicillins Hives, Swelling   Has patient had a PCN reaction causing immediate rash, facial/tongue/throat swelling, SOB or lightheadedness with hypotension: Yes Has patient had a PCN reaction causing severe rash involving mucus membranes or skin necrosis: No Has patient had a PCN reaction that required hospitalization: No Has patient had a PCN reaction occurring within the last 10 years: No If all of the above answers are "NO", then may proceed with Cephalosporin use.      Medication List    TAKE these medications   acetaminophen 500 MG tablet Commonly known as: TYLENOL Take 1,000 mg by mouth every 6 (six) hours as needed for moderate pain.   albuterol 108 (90 Base) MCG/ACT inhaler Commonly known as: VENTOLIN HFA Inhale 1-2 puffs into the lungs every 4 (four) hours as needed for wheezing or shortness of breath.   aspirin 81 MG EC tablet Take 1 tablet (81 mg total) by mouth daily.   budesonide-formoterol 80-4.5 MCG/ACT inhaler Commonly known as: SYMBICORT Inhale 2 puffs into the lungs 2 (two) times daily.   digoxin 0.125 MG tablet Commonly known as: LANOXIN Take 1 tablet (0.125 mg total) by mouth daily.   ferrous sulfate 325 (65 FE) MG EC tablet Take 325 mg by mouth daily.   furosemide 80 MG tablet Commonly known as: LASIX Take 1 tablet (80 mg total) by mouth daily.   losartan 25 MG tablet Commonly known as: COZAAR Take 0.5 tablets (12.5 mg total) by mouth daily. Start taking on: May 14, 2019   multivitamin with minerals Tabs tablet Take 1 tablet by mouth daily.   potassium chloride SA 20 MEQ tablet Commonly known as: K-DUR Take 1 tablet (20 mEq total) by mouth daily. Take 24meq with Metolazone.   spironolactone 25 MG  tablet Commonly known as: ALDACTONE Take 1 tablet (25 mg total) by mouth daily. Start taking on: May 14, 2019   warfarin 5 MG tablet Commonly known as: COUMADIN Take as directed. If you are unsure how to take this medication, talk to your nurse or doctor. Original instructions: 5 mg daily except 2.5 mg on Tuesday What changed:   how much to take  how to take this  when to take this  additional instructions       Disposition   The patient will be discharged in stable condition to home. Discharge Instructions    Diet - low sodium  heart healthy   Complete by: As directed    Heart Failure patients record your daily weight using the same scale at the same time of day   Complete by: As directed    Increase activity slowly   Complete by: As directed      Follow-up Information    [facility] Follow up on 05/20/2019.   Specialty: Cardiology Why: at 1:30  Contact information: [address] [id number] [place]he[place] [address] [phone]            Duration of Discharge Encounter: Greater than 35 minutes   Signed, Tonye Becket NP-C 05/13/2019, 10:09 AM

## 2019-05-13 NOTE — Progress Notes (Addendum)
Patient ID: Mark [name], male   DOB: October 20, 1975, 43 y.o.   MRN: [id number]     Advanced Heart Failure Rounding Note  PCP-Cardiologist: Lauree Chandler, MD   Subjective:    Admitted with recurrent cardiogenic shock. Started milrinone, currently at 0.375 mcg/kg/min.    Angry about multiple issues. Says he will eat and drink whatever he wants. Wants to go home. Denies SOB  Objective:   Weight Range: 61.4 kg Body mass index is 19.99 kg/m.   Vital Signs:   Temp:  [97.5 F (36.4 C)-98.5 F (36.9 C)] 97.9 F (36.6 C) (09/21 0328) Pulse Rate:  [90-113] 113 (09/21 0914) Resp:  [13-20] 15 (09/21 0728) BP: (91-129)/(66-91) 111/88 (09/21 0728) SpO2:  [97 %-100 %] 97 % (09/21 0826) Weight:  [61.4 kg] 61.4 kg (09/21 0328) Last BM Date: 05/11/19  Weight change: Filed Weights   05/11/19 0402 05/12/19 0403 05/13/19 0328  Weight: 61 kg 61.2 kg 61.4 kg    Intake/Output:   Intake/Output Summary (Last 24 hours) at 05/13/2019 0935 Last data filed at 05/13/2019 0400 Gross per 24 hour  Intake 656.29 ml  Output 4000 ml  Net -3343.71 ml      Physical Exam  CVP 9-10  General: Thin frustrated.  . No resp difficulty HEENT: normal Neck: supple. JVP 8-9 Carotids 2+ bilat; no bruits. No lymphadenopathy or thryomegaly appreciated. Cor: PMI laterally displaced. Regular rate & rhythm. 2/6 TR/MR  +S3  Lungs: clear no wheeze Abdomen: soft, nontender, nondistended. No hepatosplenomegaly. No bruits or masses. Good bowel sounds. Extremities: no cyanosis, clubbing, rash, edema. RUE PICC  Neuro: alert & oriented x 3, cranial nerves grossly intact. moves all 4 extremities w/o difficulty. Affect frustrated    Telemetry   Sinus Tach 100s  Personally reviewed    EKG    N/A   Labs    CBC Recent Labs    05/12/19 0423 05/13/19 0500  WBC 5.1 5.4  NEUTROABS 2.4 2.9  HGB 11.4* 12.6*  HCT 35.8* 37.5*  MCV 81.0 79.3*  PLT 175 244   Basic Metabolic Panel Recent Labs    05/12/19  0423 05/13/19 0500  NA 127* 130*  K 4.7 4.6  CL 87* 92*  CO2 30 29  GLUCOSE 117* 109*  BUN 23* 29*  CREATININE 1.07 1.09  CALCIUM 9.2 9.1   Liver Function Tests No results for input(s): AST, ALT, ALKPHOS, BILITOT, PROT, ALBUMIN in the last 72 hours. No results for input(s): LIPASE, AMYLASE in the last 72 hours. Cardiac Enzymes No results for input(s): CKTOTAL, CKMB, CKMBINDEX, TROPONINI in the last 72 hours.  BNP: BNP (last 3 results) Recent Labs    01/24/19 1955 02/13/19 2243 05/08/19 2018  BNP 2,792.5* 3,902.9* 3,017.6*    ProBNP (last 3 results) No results for input(s): PROBNP in the last 8760 hours.   D-Dimer No results for input(s): DDIMER in the last 72 hours. Hemoglobin A1C No results for input(s): HGBA1C in the last 72 hours. Fasting Lipid Panel No results for input(s): CHOL, HDL, LDLCALC, TRIG, CHOLHDL, LDLDIRECT in the last 72 hours. Thyroid Function Tests No results for input(s): TSH, T4TOTAL, T3FREE, THYROIDAB in the last 72 hours.  Invalid input(s): FREET3  Other results:   Imaging    No results found.   Medications:     Scheduled Medications: . Chlorhexidine Gluconate Cloth  6 each Topical Daily  . digoxin  0.125 mg Oral Daily  . ferrous sulfate  325 mg Oral Daily  . losartan  12.5 mg  Oral Daily  . mometasone-formoterol  2 puff Inhalation BID  . potassium chloride  40 mEq Oral Daily  . sodium chloride flush  10-40 mL Intracatheter Q12H  . sodium chloride flush  3 mL Intravenous Q12H  . spironolactone  25 mg Oral Daily  . Warfarin - Pharmacist Dosing Inpatient   Does not apply q1800    Infusions: . sodium chloride    . milrinone 0.375 mcg/kg/min (05/13/19 0400)    PRN Medications: sodium chloride, acetaminophen, albuterol, ondansetron (ZOFRAN) IV, sodium chloride flush, sodium chloride flush, traMADol    Patient Profile  Dandrea C Hayesis a 43 y.o.malewith a history ofchronic systolic CHF (EF 16-83% with severe diffuse  hypokinesis5/2020,NICM by cardiac cath 2017, history of right MCA CVA 2019, asthma, andpolysubstance use.  Admitted with recurrent cardiogenic shock.   Assessment/Plan   1. Acute on chronic systolic CHF/cardiogenic shock:  History of nonischemic cardiomyopathy.  Las echo from 5/20 showed that EF is markedly low at 10% with LV apical thrombus. The RV is severely dysfunctional as well.  RHC in 8/20 with low output and high filling pressures, he was on milrinone during 8/20 admission but insisted on going home for funeral.  On re-admit he was markedly overloaded with cool extremities and elevated lactate, low output with co-ox in 30s.  Started on milrinone and diuresed with IV Lasix.  CO-OX Stable.  - He is adamant he is going home today.   - Stop milrinone.  - Continue digoxin and spironolactone.  - Continue losartan 12.5 mg daily, SBP in 90s so will not increase.   - End stage biventricular cardiomyopathy.  Not sure there are great options here.  His RV is quite dysfunctional on echo and LFTs are elevated.  RV dysfunction and social issues will be a barrier to LVAD. Plan now is for consideration of home on palliative milrinone gtt.  2. LV thrombus: Noted on 5/20 echo.  - Continue warfarin.  3. Elevated LFTs: Suspect this is due to RV failure/congestive hepatopathy.  4. History of substance abuse: Cocaine use as recently as 6/20. UDS negative on admit.  5. Hypokalemia- Supp K as needed.   Home today. He understands he will likely decompensate. Palliative Care consulted. He would benefit from Hospice but he is not ready for that. Agreeable to Palliative Care.  Remove PICC> .   Tonye Becket, NP  9:35 AM  05/13/2019   Patient seen and examined with the above-signed Advanced Practice Provider and/or Housestaff. I personally reviewed laboratory data, imaging studies and relevant notes. I independently examined the patient and formulated the important aspects of the plan. I have edited the note to  reflect any of my changes or salient points. I have personally discussed the plan with the patient and/or family.  He is very frustrated today. Co-ox improved to 63% on milrinone 0.375 but he does not want home inotropes. Not candidate for VAD or transplant as I have d/w him previously. He is adamant on going home today. He realizes he has high risk of readmission or death. Unfortunately there just aren;t good options for him at this point.   Arvilla Meres, MD  11:44 AM

## 2019-05-13 NOTE — Progress Notes (Signed)
[facility] for Coumadin Indication: h/o LV thrombus  Allergies  Allergen Reactions  . Clindamycin/Lincomycin Swelling and Palpitations  . Hydrocodone Hives  . Lisinopril Swelling and Other (See Comments)    Facial swelling/angioedema  . Olopatadine Hcl Swelling    Makes his face swell up  . Prednisone Shortness Of Breath, Nausea Only, Swelling and Other (See Comments)    Also made chest feel tight and genital area, legs, and face became swollen badly  . Bactrim [Sulfamethoxazole-Trimethoprim] Other (See Comments)    unknown  . Penicillins Hives and Swelling     Has patient had a PCN reaction causing immediate rash, facial/tongue/throat swelling, SOB or lightheadedness with hypotension: Yes Has patient had a PCN reaction causing severe rash involving mucus membranes or skin necrosis: No Has patient had a PCN reaction that required hospitalization: No Has patient had a PCN reaction occurring within the last 10 years: No If all of the above answers are "NO", then may proceed with Cephalosporin use.     Patient Measurements: Height: 5\' 9"  (175.3 cm) Weight: 135 lb 5.8 oz (61.4 kg) IBW/kg (Calculated) : 70.7  Vital Signs: Temp: 97.9 F (36.6 C) (09/21 0328) Temp Source: Oral (09/21 0328) BP: 111/88 (09/21 0728) Pulse Rate: 113 (09/21 0914)  Labs: Recent Labs    05/11/19 0544 05/12/19 0423 05/12/19 0444 05/13/19 0500  HGB 11.2* 11.4*  --  12.6*  HCT 35.4* 35.8*  --  37.5*  PLT 172 175  --  205  LABPROT 23.0*  --  25.1* 25.4*  INR 2.1*  --  2.3* 2.4*  CREATININE 1.03 1.07  --  1.09    Estimated Creatinine Clearance: 74.3 mL/min (by C-G formula based on SCr of 1.09 mg/dL).   Medical History: Past Medical History:  Diagnosis Date  . Asthma   . Chronic systolic CHF (congestive heart failure) ([place])   . Cigarette smoker   . CKD (chronic kidney disease), stage II    Archie Endo 10/01/2017  . COPD (chronic obstructive pulmonary disease)  ([place]) 10/21/2017   on CT scan chest  . History of echocardiogram    a. Echo 5/17 - EF 20-25%, severe diff HK, restrictive physiology, mild to mod MR, severe reduced RVSF, mod RVE, mild RAE, mod TR, PASP 48 mmHg  . Hx of cardiac cath    a. LHC 5/17 - normal coronary arteries. PA 45/25, mean 33, PCWP mean 18  . NICM (nonischemic cardiomyopathy) ([place])   . Stroke ([place]) 09/27/2017   "was weak on my left side; I'm fully recovered" (11/09/2017)  . Substance abuse ([place])    cocaine, marijuana    Assessment: 43yo male admitted w/ acute on chronic CHF w/ cardiogenic shock, to continue Coumadin for h/o LV thrombus. -INR= 2.4 at goal -CBC stable  Home coumadin dose (per clinic records)- coumadin dose was 5mg /day except take 2.5mg  on Tuesday  Goal of Therapy:  INR 2-3   Plan:  DC home with appt at [facility] Continue home dose warfarin 5mg  daily except 2.5mg  on [place].D. CPP, BCPS Clinical Pharmacist [phone] 05/13/2019 12:44 PM

## 2019-05-13 NOTE — Telephone Encounter (Signed)
Received a call from hospice [phone] stating that patient was being discharged from the hospital and that they wanted to inform PCP that they would handle palliative services.   This is just an FYI   Thank you Whitney Post

## 2019-05-13 NOTE — Progress Notes (Signed)
[facility] has been called for patient to transport home to [address] [place] [address]. Patient to be wheeled down to main entrance.   [place], [place]

## 2019-05-15 ENCOUNTER — Telehealth: Payer: Self-pay | Admitting: Internal Medicine

## 2019-05-15 ENCOUNTER — Telehealth (HOSPITAL_COMMUNITY): Payer: Self-pay | Admitting: Licensed Clinical Social Worker

## 2019-05-15 NOTE — Telephone Encounter (Signed)
CSW reaching out to patient to check in after discharge from the hospital and follow up regarding palliative consult placed during inpatient stay.  CSW attempted call yesterday and left voicemail with no return call  CSW attempted again this morning and went straight to voicemail- texted patient to return call when available  CSW will continue to follow and assist as needed  Jorge [place], Home Clinic Desk#: [phone] Cell#: [phone]

## 2019-05-15 NOTE — Telephone Encounter (Signed)
Called patient to schedule Palliative Consult no answer.  Left message with reason for call along with my contact information.

## 2019-05-16 ENCOUNTER — Other Ambulatory Visit (HOSPITAL_COMMUNITY): Payer: Self-pay

## 2019-05-16 ENCOUNTER — Telehealth: Payer: Self-pay

## 2019-05-16 NOTE — Progress Notes (Signed)
I spoke with Tammy Sours earlier ans she stated that nobody has been able to make contact with Mr. Boydstun.  His phone is going straight to voicemail.  Upon my arrival Mr. Klausing is fully dressed sitting on the porch.  He stated that he still has an sinus infection and he was sent home from the hospital with no medications for the infection. I asked him about speaking with someone from palliative care and he said that he hasn't.  He initially was very reluctant to speak to someone from pallative care, but we discussed him "at least talking to them to see what they offer".  He agrees.   Mr. Carattini only complaint today is sinus pressure and drainage from his nose. He denies chest pain, dizziness and sob.  He said that he's taken all of his medications.  He hasn't weighed today and didn't "feel like weighing" during our visit.    [facility] @  [place] RN, is the contact person for palliative care.

## 2019-05-16 NOTE — Telephone Encounter (Signed)
Palliative Medicine RN Note: Rec'd a call from paramedicine team member Demetria. She was at the pt's home and said he is waiting for [facility] to call and make an appt with him. I returned Demetria's call to let her know that our team is in-patient only and that it is [facility] who will be seeing him; they called him yesterday and left a message. Demetria said she will go back to the house and make sure the patient has this information.  Marjie Skiff Avel Ogawa, RN, BSN, [facility] Palliative Medicine Team 05/16/2019 1:30 PM Office [phone]

## 2019-05-20 ENCOUNTER — Ambulatory Visit (HOSPITAL_COMMUNITY)
Admit: 2019-05-20 | Discharge: 2019-05-20 | Disposition: A | Discharge status: Home / Self Care | Payer: Medicaid Other | Source: Ambulatory Visit | Attending: Adult Health | Admitting: Adult Health

## 2019-05-20 ENCOUNTER — Other Ambulatory Visit: Payer: Self-pay

## 2019-05-20 ENCOUNTER — Encounter (HOSPITAL_COMMUNITY): Payer: Self-pay

## 2019-05-20 ENCOUNTER — Telehealth (HOSPITAL_COMMUNITY): Payer: Self-pay | Admitting: Licensed Clinical Social Worker

## 2019-05-20 ENCOUNTER — Ambulatory Visit (INDEPENDENT_AMBULATORY_CARE_PROVIDER_SITE_OTHER): Payer: Medicaid Other | Admitting: *Deleted

## 2019-05-20 VITALS — BP 110/58 | HR 108 | Wt 148.2 lb

## 2019-05-20 DIAGNOSIS — K0530 Chronic periodontitis, unspecified: Secondary | ICD-10-CM | POA: Diagnosis not present

## 2019-05-20 DIAGNOSIS — I5022 Chronic systolic (congestive) heart failure: Secondary | ICD-10-CM

## 2019-05-20 DIAGNOSIS — Z7901 Long term (current) use of anticoagulants: Secondary | ICD-10-CM | POA: Insufficient documentation

## 2019-05-20 DIAGNOSIS — I513 Intracardiac thrombosis, not elsewhere classified: Secondary | ICD-10-CM

## 2019-05-20 DIAGNOSIS — Z7982 Long term (current) use of aspirin: Secondary | ICD-10-CM | POA: Insufficient documentation

## 2019-05-20 DIAGNOSIS — Z8673 Personal history of transient ischemic attack (TIA), and cerebral infarction without residual deficits: Secondary | ICD-10-CM | POA: Insufficient documentation

## 2019-05-20 DIAGNOSIS — Z5181 Encounter for therapeutic drug level monitoring: Secondary | ICD-10-CM | POA: Diagnosis not present

## 2019-05-20 DIAGNOSIS — Z8249 Family history of ischemic heart disease and other diseases of the circulatory system: Secondary | ICD-10-CM | POA: Insufficient documentation

## 2019-05-20 DIAGNOSIS — Z79899 Other long term (current) drug therapy: Secondary | ICD-10-CM | POA: Insufficient documentation

## 2019-05-20 DIAGNOSIS — I5082 Biventricular heart failure: Secondary | ICD-10-CM | POA: Diagnosis not present

## 2019-05-20 DIAGNOSIS — J449 Chronic obstructive pulmonary disease, unspecified: Secondary | ICD-10-CM | POA: Insufficient documentation

## 2019-05-20 DIAGNOSIS — I428 Other cardiomyopathies: Secondary | ICD-10-CM | POA: Diagnosis not present

## 2019-05-20 DIAGNOSIS — Z87891 Personal history of nicotine dependence: Secondary | ICD-10-CM | POA: Insufficient documentation

## 2019-05-20 DIAGNOSIS — F1410 Cocaine abuse, uncomplicated: Secondary | ICD-10-CM | POA: Diagnosis not present

## 2019-05-20 DIAGNOSIS — N182 Chronic kidney disease, stage 2 (mild): Secondary | ICD-10-CM | POA: Diagnosis not present

## 2019-05-20 DIAGNOSIS — R0981 Nasal congestion: Secondary | ICD-10-CM | POA: Insufficient documentation

## 2019-05-20 LAB — BASIC METABOLIC PANEL
Anion gap: 8 (ref 5–15)
BUN: 24 mg/dL — ABNORMAL HIGH (ref 6–20)
CO2: 27 mmol/L (ref 22–32)
Calcium: 9.2 mg/dL (ref 8.9–10.3)
Chloride: 100 mmol/L (ref 98–111)
Creatinine, Ser: 1.01 mg/dL (ref 0.61–1.24)
GFR calc Af Amer: >60 mL/min (ref >60)
GFR calc non Af Amer: >60 mL/min (ref >60)
Glucose, Bld: 82 mg/dL (ref 70–99)
Potassium: 4.2 mmol/L (ref 3.5–5.1)
Sodium: 135 mmol/L (ref 135–145)

## 2019-05-20 LAB — POCT INR: INR: 2.8 (ref 2.0–3.0)

## 2019-05-20 MED ORDER — FUROSEMIDE 80 MG PO TABS: ORAL_TABLET | ORAL | 3 refills | Status: DC

## 2019-05-20 MED FILL — FUROSEMIDE 80 MG TAB: 80 | 30 days supply | Qty: 45 | Fill #0

## 2019-05-20 NOTE — Patient Instructions (Addendum)
Lab work done today. We will notify you of any abnormal lab work. No new is good news!  INCREASE Furosemide to 80mg  tab every morning AND 40mg  tab every evening STARTING Wednesday.  Please follow up with the [facility] in 1 week and again the next week.  At the [facility], you and your health needs are our priority. As part of our continuing mission to provide you with exceptional heart care, we have created designated Provider Care Teams. These Care Teams include your primary Cardiologist (physician) and Advanced Practice Providers (APPs- Physician Assistants and Nurse Practitioners) who all work together to provide you with the care you need, when you need it.   You may see any of the following providers on your designated Care Team at your next follow up: Marland Kitchen Dr Glori Bickers . Dr Loralie Champagne . Darrick Grinder, NP   Please be sure to bring in all your medications bottles to every appointment.

## 2019-05-20 NOTE — Telephone Encounter (Addendum)
CSW attempting to get in contact with pt to see if he needs transport to his coumadin and Heart Failure clinic appts today.  Phone goes straight to voicemail.  CSW called both emergency contacts on file- left voicemail with pt spouse but was able to speak to pt sister- she will also try to reach out to patient   Patient received message from sister and reached out- confirmed he can make it to his coumadin appt today as well as his heart failure clinic appt but needs a taxi.  CSW set up taxi to pick pt up for appt.  CSW will continue to follow and assist as needed  Jorge Ny, [facility] Desk#: [phone] Cell#: [phone]

## 2019-05-20 NOTE — Progress Notes (Signed)
PCP:  Kerin Perna, NP          Cardiologist:  Lauree Chandler, MD Primary HF: Dr Haroldine Laws  HPI: Mark [name] is a 43 y.o. male with a history of  significant forchronic systolic CHF (EF 30-86% with severe diffuse hypokinesis 12/2018,NICM by cardiac cath 2017, history of right MCA CVA 2019, asthma, and polysubstance use.  In 2017 he was diagnosed with new onset acute systolic heart failure. Cath at that time showed normal coronaries. He was started on HF meds. Unfortunately he was incarcerated 14 months until late 2018.    Admitted 12/2018 with increased dyspnea. He had run out of medications.  ECHO completed 12/2018 showed severely reduced LV/RVEF 5-10% with large apical thrombus and grade II DD. At that time he refused to go on coumadin.   Admitted to [facility] 01/24/2019 with increased dyspnea. UDS was + for cocaine. Diuresed with IV lasix. He had run out of his Hf medications. He was restarted back on HF medications.  Admitted  02/13/19 through 5/78/46 with A/C systolic heart failure. He also had dental pain and was placed on antibiotics. Dr Enrique Sack consulted. Planning for  multiple teeth extractions with general anesthesia.    Admitted 05/08/19 with low output heart failure and volume overload. Diuresed with IV lasix + milrinone. Considered home milrinone but he refused. On the day of discharge he was adamant he was going home so milrinone was turned off. He met with Palliative Care and was not ready for Hospice. Discharge weight was 135 pounds.   Today he returns for post hospital follow up. Overall feeling ok.  Wants to know why he cant get a heart pump. He was adamant today that he doesn't feel bad. He says he takes his time walking. Says he gets nasal congestion. Denies PND/Orthopnea. Appetite ok. Frustrated because he wants to gain weight. No fever or chills. He has not been weighing at home. Taking all medications. He has difficulty with transportation. Followed by [facility].   Cardiac Studies  ECHO 12/2018 EF 10-15% severely reduced RV and new apical thrombus.  ECHO 2019 EF 10-15% ECHO 12/2018 LVEF 5-10%  RV severely reduced.   ROS: All systems negative except as listed in HPI, PMH and Problem List.  SH:  Social History   Socioeconomic History  . Marital status: Divorced    Spouse name: Not on file  . Number of children: Not on file  . Years of education: Not on file  . Highest education level: Not on file  Occupational History  . Not on file  Social Needs  . Financial resource strain: Somewhat hard  . Food insecurity    Worry: Sometimes true    Inability: Sometimes true  . Transportation needs    Medical: No    Non-medical: No  Tobacco Use  . Smoking status: Former Smoker    Packs/day: 1.00    Years: 30.00    Pack years: 30.00    Types: Cigarettes    Quit date: 09/27/2017    Years since quitting: 1.6  . Smokeless tobacco: Never Used  Substance and Sexual Activity  . Alcohol use: Yes    Alcohol/week: 5.0 standard drinks    Types: 5 Shots of liquor per week    Frequency: Never    Comment: 5-6 shots of vodka daily; "stopped it all after I had stroke 09/27/2017"  . Drug use: Yes    Types: Cocaine, Marijuana    Comment: 11/09/2017 "none since 09/27/2017"  .  Sexual activity: Not Currently  Lifestyle  . Physical activity    Days per week: 0 days    Minutes per session: 0 min  . Stress: To some extent  Relationships  . Social connections    Talks on phone: More than three times a week    Gets together: More than three times a week    Attends religious service: 1 to 4 times per year    Active member of club or organization: No    Attends meetings of clubs or organizations: Never    Relationship status: Divorced  . Intimate partner violence    Fear of current or ex partner: No    Emotionally abused: No    Physically abused: No    Forced sexual activity: No  Other Topics Concern  . Not on file  Social History Narrative   . Not on file    FH:  Family History  Problem Relation Age of Onset  . Cardiomyopathy Father        Reports his father has an LVAD  . Heart failure Father   . Hypertension Father   . Heart disease Maternal Grandmother        had a whole in her heart  . Deep vein thrombosis Neg Hx     Past Medical History:  Diagnosis Date  . Asthma   . Chronic systolic CHF (congestive heart failure) (HCC)   . Cigarette smoker   . CKD (chronic kidney disease), stage II    /notes 10/01/2017  . COPD (chronic obstructive pulmonary disease) (HCC) 10/21/2017   on CT scan chest  . History of echocardiogram    a. Echo 5/17 - EF 20-25%, severe diff HK, restrictive physiology, mild to mod MR, severe reduced RVSF, mod RVE, mild RAE, mod TR, PASP 48 mmHg  . Hx of cardiac cath    a. LHC 5/17 - normal coronary arteries. PA 45/25, mean 33, PCWP mean 18  . NICM (nonischemic cardiomyopathy) (HCC)   . Stroke (HCC) 09/27/2017   "was weak on my left side; I'm fully recovered" (11/09/2017)  . Substance abuse (HCC)    cocaine, marijuana    Current Outpatient Medications  Medication Sig Dispense Refill  . acetaminophen (TYLENOL) 500 MG tablet Take 1,000 mg by mouth every 6 (six) hours as needed for moderate pain.    . albuterol (VENTOLIN HFA) 108 (90 Base) MCG/ACT inhaler Inhale 1-2 puffs into the lungs every 4 (four) hours as needed for wheezing or shortness of breath. 8.5 g 2  . aspirin 81 MG EC tablet Take 1 tablet (81 mg total) by mouth daily. 30 tablet 4  . budesonide-formoterol (SYMBICORT) 80-4.5 MCG/ACT inhaler Inhale 2 puffs into the lungs 2 (two) times daily. 1 Inhaler 3  . digoxin (LANOXIN) 0.125 MG tablet Take 1 tablet (0.125 mg total) by mouth daily. 30 tablet 5  . ferrous sulfate 325 (65 FE) MG EC tablet Take 325 mg by mouth daily.    . furosemide (LASIX) 80 MG tablet Take 1 tablet (80 mg total) by mouth daily. 30 tablet 3  . losartan (COZAAR) 25 MG tablet Take 0.5 tablets (12.5 mg total) by mouth  daily. 30 tablet 6  . Multiple Vitamin (MULTIVITAMIN WITH MINERALS) TABS tablet Take 1 tablet by mouth daily.    . potassium chloride SA (K-DUR) 20 MEQ tablet Take 1 tablet (20 mEq total) by mouth daily. Take 40meq with Metolazone. 60 tablet 0  . spironolactone (ALDACTONE) 25 MG tablet Take 1 tablet (  25 mg total) by mouth daily. 30 tablet 6  . warfarin (COUMADIN) 5 MG tablet 5 mg daily except 2.5 mg on Tuesday 30 tablet 1   No current facility-administered medications for this encounter.     Vitals:   05/20/19 1329  BP: (!) 110/58  Pulse: (!) 108  SpO2: 98%  Weight: 67.2 kg (148 lb 3.2 oz)   Wt Readings from Last 3 Encounters:  05/20/19 67.2 kg (148 lb 3.2 oz)  05/13/19 61.4 kg (135 lb 5.8 oz)  04/16/19 62.6 kg (138 lb)    PHYSICAL EXAM: General:  No resp difficulty. Walked slowly in the clinic.  HEENT: normal Neck: supple. JVP no JVD. Carotids 2+ bilat; no bruits. No lymphadenopathy or thryomegaly appreciated. Cor: PMI nondisplaced. Regular rate & rhythm. No rubs, or murmurs. +S3  Lungs: clear Abdomen: soft, nontender, nondistended. No hepatosplenomegaly. No bruits or masses. Good bowel sounds. Extremities: no cyanosis, clubbing, rash, R and LLE trace-1+ edema Neuro: alert & orientedx3, cranial nerves grossly intact. moves all 4 extremities w/o difficulty. Affect pleasant  ASSESSMENT & PLAN: 1. Chronic Biventricular Heart Failure ECHO 12/2018 EF 10-15% Severely reduced RV.  NYHA Suspect III. Volume status mildly elevated.  - Continue lasix 80 mg daily and add 40 mg daily.  - No bb with low output and recent cocaine abuse.  - Continue digoxin 0.125 mg daily.  - Continue spironolactone to 25 mg daily.   - Continue losartan 12.5 mg daily  No entresto with h/o angioedema.   -Check BMET today.  -Not a candidate for LVAD with RV failure.   2. Apical Thrombus Identified on ECHO 12/2018. At that time he refused to take coumadin. -Continue on coumadin. - INR followed at the  coumadin clinic.   3. H/O MCA CVA 2019  Continue  atorvastatin 40 mg daily. Continue aspirin 81 mg daily.   4. Cocaine Abuse UDS + in June   5. Social  HF meds provided through HF fund.  Continue HF Paramedicine  6. Tobacco Abuse Discussed smoking cessation.   7. Periodontitis  Follow up next week and in 2 weeks. We discussed possible home inotropes versus Hospice. He is adamant he doesn't want to go back in the hospital because he doesn't feel bad and that he wants to spend time with his family. We discussed home milrinone may improve quality of life but would not extend his life.  He is not prepared to transition to Hospice.  Follow up in 1 week x 2. If he is readmitted we will consider home inotropes. Continue HF Paramedicine.    Amy Clegg NP-C  1:39 PM     

## 2019-05-20 NOTE — Patient Instructions (Addendum)
Description    Continue on same dosage 1 tablet daily except 1/2 tablet on Tuesdays. Please adhere to appointments. Recheck INR in 2 weeks. Coumadin Clinic#[phone] [phone]

## 2019-05-23 MED FILL — WARFARIN SODIUM 5 MG TABLET: 5 | 30 days supply | Qty: 30 | Fill #1

## 2019-05-23 MED FILL — LOSARTAN POTASSIUM 25 MG TA: 25 | 30 days supply | Qty: 30 | Fill #2

## 2019-05-23 MED FILL — FUROSEMIDE 80 MG TAB: 80 | 30 days supply | Qty: 30 | Fill #2

## 2019-05-24 ENCOUNTER — Telehealth (HOSPITAL_COMMUNITY): Payer: Self-pay

## 2019-05-24 NOTE — Telephone Encounter (Signed)
Mr. Pen called and said that his phone has been "messed up since earlier this week".  I told him that I'd stopped by Tuesday, he wasn't home. He said that he feels fine and has no complaints.  We agree to meet next week.

## 2019-05-27 ENCOUNTER — Encounter (HOSPITAL_COMMUNITY): Payer: Medicaid Other

## 2019-05-27 ENCOUNTER — Telehealth (HOSPITAL_COMMUNITY): Payer: Self-pay | Admitting: Licensed Clinical Social Worker

## 2019-05-27 NOTE — Telephone Encounter (Signed)
CSW called pt to check regarding appt today as he usually requires transport.  Pt actually scheduled for appt this week and next and pt was unaware of appt for this week and he is not available.  CSW called clinic scheduler who confirmed it was ok with the providers to see him next week and canceled todays appt.  CSW confirmed pt need for transport to next weeks appt and requested taxi in appt notes.  CSW will continue to follow and assist as needed  Jorge Ny, [facility] Desk#: [phone] Cell#: [phone]

## 2019-06-03 ENCOUNTER — Telehealth (HOSPITAL_COMMUNITY): Payer: Self-pay | Admitting: Licensed Clinical Social Worker

## 2019-06-03 ENCOUNTER — Telehealth: Payer: Self-pay

## 2019-06-03 NOTE — Telephone Encounter (Signed)
CHF Clinic called states pt is unable to come to his appt today since his kids are in town.  The usually arrange transportation for pt.  Attempted to call pt to reschedule appt, NA and VM is not set up.

## 2019-06-03 NOTE — Telephone Encounter (Signed)
CSW texted patient to inquire about ride to coumadin appt today.  Pt reports that he has his children with him who he hasn't seen since Christmas time and is wanting to focus on them- has taken the physicians warnings about being towards end of life seriously and does not want to give up time with his children while he has them.  CSW called coumadin clinic to inform of need to cancel and reschedule appt for the future.  CSW also confirmed that pt would need to cancel clinic appt tomorrow- they have rescheduled him for next week and pt made aware of new appt time.  CSW will continue to follow and assist as needed.  Jorge Ny, LCSW Clinical Social Worker [facility] Desk#: [phone] Cell#: [phone]

## 2019-06-03 NOTE — Telephone Encounter (Signed)
Cancelled appt today in Coumadin Clinic, unable to get in touch with pt to reschedule.

## 2019-06-04 ENCOUNTER — Encounter (HOSPITAL_COMMUNITY): Payer: Medicaid Other

## 2019-06-05 NOTE — Telephone Encounter (Signed)
Called the pt and it rings then states that the voicemail box has not been set up, therefore, cannot leave a message for the pt.

## 2019-06-09 ENCOUNTER — Encounter (HOSPITAL_COMMUNITY): Payer: Self-pay | Admitting: Emergency Medicine

## 2019-06-09 ENCOUNTER — Emergency Department (HOSPITAL_COMMUNITY): Payer: Medicaid Other

## 2019-06-09 ENCOUNTER — Other Ambulatory Visit: Payer: Self-pay

## 2019-06-09 ENCOUNTER — Inpatient Hospital Stay (HOSPITAL_COMMUNITY)
Admission: EM | Admit: 2019-06-09 | Discharge: 2019-06-18 | DRG: 193 | Disposition: A | Discharge status: Home / Self Care | Payer: Medicaid Other | Attending: Internal Medicine | Admitting: Internal Medicine

## 2019-06-09 DIAGNOSIS — Z7901 Long term (current) use of anticoagulants: Secondary | ICD-10-CM | POA: Diagnosis not present

## 2019-06-09 DIAGNOSIS — Z888 Allergy status to other drugs, medicaments and biological substances status: Secondary | ICD-10-CM

## 2019-06-09 DIAGNOSIS — R570 Cardiogenic shock: Secondary | ICD-10-CM | POA: Diagnosis not present

## 2019-06-09 DIAGNOSIS — Z79899 Other long term (current) drug therapy: Secondary | ICD-10-CM | POA: Diagnosis not present

## 2019-06-09 DIAGNOSIS — I361 Nonrheumatic tricuspid (valve) insufficiency: Secondary | ICD-10-CM | POA: Diagnosis not present

## 2019-06-09 DIAGNOSIS — Z8673 Personal history of transient ischemic attack (TIA), and cerebral infarction without residual deficits: Secondary | ICD-10-CM

## 2019-06-09 DIAGNOSIS — J189 Pneumonia, unspecified organism: Secondary | ICD-10-CM | POA: Diagnosis present

## 2019-06-09 DIAGNOSIS — J154 Pneumonia due to other streptococci: Secondary | ICD-10-CM | POA: Diagnosis present

## 2019-06-09 DIAGNOSIS — F1910 Other psychoactive substance abuse, uncomplicated: Secondary | ICD-10-CM | POA: Diagnosis present

## 2019-06-09 DIAGNOSIS — Z8249 Family history of ischemic heart disease and other diseases of the circulatory system: Secondary | ICD-10-CM

## 2019-06-09 DIAGNOSIS — I5084 End stage heart failure: Secondary | ICD-10-CM | POA: Diagnosis present

## 2019-06-09 DIAGNOSIS — Z515 Encounter for palliative care: Secondary | ICD-10-CM | POA: Diagnosis present

## 2019-06-09 DIAGNOSIS — Z20828 Contact with and (suspected) exposure to other viral communicable diseases: Secondary | ICD-10-CM | POA: Diagnosis present

## 2019-06-09 DIAGNOSIS — Z7951 Long term (current) use of inhaled steroids: Secondary | ICD-10-CM

## 2019-06-09 DIAGNOSIS — E440 Moderate protein-calorie malnutrition: Secondary | ICD-10-CM | POA: Diagnosis present

## 2019-06-09 DIAGNOSIS — J13 Pneumonia due to Streptococcus pneumoniae: Secondary | ICD-10-CM | POA: Diagnosis not present

## 2019-06-09 DIAGNOSIS — F17210 Nicotine dependence, cigarettes, uncomplicated: Secondary | ICD-10-CM | POA: Diagnosis present

## 2019-06-09 DIAGNOSIS — I5022 Chronic systolic (congestive) heart failure: Secondary | ICD-10-CM

## 2019-06-09 DIAGNOSIS — Z885 Allergy status to narcotic agent status: Secondary | ICD-10-CM

## 2019-06-09 DIAGNOSIS — E871 Hypo-osmolality and hyponatremia: Secondary | ICD-10-CM | POA: Diagnosis present

## 2019-06-09 DIAGNOSIS — Z880 Allergy status to penicillin: Secondary | ICD-10-CM

## 2019-06-09 DIAGNOSIS — J440 Chronic obstructive pulmonary disease with acute lower respiratory infection: Secondary | ICD-10-CM | POA: Diagnosis present

## 2019-06-09 DIAGNOSIS — Z881 Allergy status to other antibiotic agents status: Secondary | ICD-10-CM

## 2019-06-09 DIAGNOSIS — I5023 Acute on chronic systolic (congestive) heart failure: Secondary | ICD-10-CM

## 2019-06-09 DIAGNOSIS — Z720 Tobacco use: Secondary | ICD-10-CM | POA: Diagnosis not present

## 2019-06-09 DIAGNOSIS — I428 Other cardiomyopathies: Secondary | ICD-10-CM | POA: Diagnosis present

## 2019-06-09 DIAGNOSIS — I5082 Biventricular heart failure: Secondary | ICD-10-CM | POA: Diagnosis present

## 2019-06-09 DIAGNOSIS — Z7189 Other specified counseling: Secondary | ICD-10-CM | POA: Diagnosis not present

## 2019-06-09 DIAGNOSIS — J45909 Unspecified asthma, uncomplicated: Secondary | ICD-10-CM | POA: Diagnosis present

## 2019-06-09 DIAGNOSIS — N179 Acute kidney failure, unspecified: Secondary | ICD-10-CM | POA: Diagnosis present

## 2019-06-09 DIAGNOSIS — Z6822 Body mass index (BMI) 22.0-22.9, adult: Secondary | ICD-10-CM | POA: Diagnosis not present

## 2019-06-09 DIAGNOSIS — N182 Chronic kidney disease, stage 2 (mild): Secondary | ICD-10-CM | POA: Diagnosis present

## 2019-06-09 DIAGNOSIS — I513 Intracardiac thrombosis, not elsewhere classified: Secondary | ICD-10-CM | POA: Diagnosis present

## 2019-06-09 DIAGNOSIS — J4520 Mild intermittent asthma, uncomplicated: Secondary | ICD-10-CM

## 2019-06-09 DIAGNOSIS — E872 Acidosis: Secondary | ICD-10-CM | POA: Diagnosis present

## 2019-06-09 DIAGNOSIS — I340 Nonrheumatic mitral (valve) insufficiency: Secondary | ICD-10-CM | POA: Diagnosis not present

## 2019-06-09 DIAGNOSIS — T502X5A Adverse effect of carbonic-anhydrase inhibitors, benzothiadiazides and other diuretics, initial encounter: Secondary | ICD-10-CM | POA: Diagnosis present

## 2019-06-09 DIAGNOSIS — Z7982 Long term (current) use of aspirin: Secondary | ICD-10-CM | POA: Diagnosis not present

## 2019-06-09 DIAGNOSIS — Z882 Allergy status to sulfonamides status: Secondary | ICD-10-CM

## 2019-06-09 DIAGNOSIS — I5042 Chronic combined systolic (congestive) and diastolic (congestive) heart failure: Secondary | ICD-10-CM | POA: Diagnosis present

## 2019-06-09 LAB — URINALYSIS, ROUTINE W REFLEX MICROSCOPIC
Bacteria, UA: NONE SEEN
Bilirubin Urine: NEGATIVE
Glucose, UA: NEGATIVE mg/dL
Glucose, UA: NEGATIVE mg/dL
Hgb urine dipstick: NEGATIVE
Hgb urine dipstick: NEGATIVE
Ketones, ur: NEGATIVE mg/dL
Ketones, ur: NEGATIVE mg/dL
Leukocytes,Ua: NEGATIVE
Nitrite: NEGATIVE
Nitrite: NEGATIVE
Protein, ur: NEGATIVE mg/dL
Protein, ur: NEGATIVE mg/dL
Specific Gravity, Urine: 1.016 (ref 1.005–1.030)
Specific Gravity, Urine: 1.016 (ref 1.005–1.030)
pH: 6 (ref 5.0–8.0)
pH: 6 (ref 5.0–8.0)

## 2019-06-09 LAB — BASIC METABOLIC PANEL
Anion gap: 12 (ref 5–15)
BUN: 21 mg/dL — ABNORMAL HIGH (ref 6–20)
CO2: 24 mmol/L (ref 22–32)
Calcium: 8.8 mg/dL — ABNORMAL LOW (ref 8.9–10.3)
Chloride: 93 mmol/L — ABNORMAL LOW (ref 98–111)
Creatinine, Ser: 1.3 mg/dL — ABNORMAL HIGH (ref 0.61–1.24)
GFR calc Af Amer: >60 mL/min (ref >60)
GFR calc non Af Amer: >60 mL/min (ref >60)
Glucose, Bld: 107 mg/dL — ABNORMAL HIGH (ref 70–99)
Potassium: 5.1 mmol/L (ref 3.5–5.1)
Sodium: 129 mmol/L — ABNORMAL LOW (ref 135–145)

## 2019-06-09 LAB — LACTIC ACID, PLASMA: Lactic Acid, Venous: 1.6 mmol/L (ref 0.5–1.9)

## 2019-06-09 LAB — PHOSPHORUS: Phosphorus: 2.6 mg/dL (ref 2.5–4.6)

## 2019-06-09 LAB — CBC
HCT: 39.4 % (ref 39.0–52.0)
Hemoglobin: 12.8 g/dL — ABNORMAL LOW (ref 13.0–17.0)
MCH: 26.1 pg (ref 26.0–34.0)
MCHC: 32.5 g/dL (ref 30.0–36.0)
MCV: 80.2 fL (ref 80.0–100.0)
Platelets: 196 10*3/uL (ref 150–400)
RBC: 4.91 MIL/uL (ref 4.22–5.81)
RDW: 16.6 % — ABNORMAL HIGH (ref 11.5–15.5)
WBC: 13.1 10*3/uL — ABNORMAL HIGH (ref 4.0–10.5)
nRBC: 0 % (ref 0.0–0.2)

## 2019-06-09 LAB — PROCALCITONIN: Procalcitonin: 5.97 ng/mL

## 2019-06-09 LAB — PROTIME-INR
INR: 2.3 — ABNORMAL HIGH (ref 0.8–1.2)
Prothrombin Time: 24.8 seconds — ABNORMAL HIGH (ref 11.4–15.2)

## 2019-06-09 LAB — DIGOXIN LEVEL: Digoxin Level: 0.3 ng/mL — ABNORMAL LOW (ref 0.8–2.0)

## 2019-06-09 LAB — APTT: aPTT: 33 seconds (ref 24–36)

## 2019-06-09 LAB — BRAIN NATRIURETIC PEPTIDE: B Natriuretic Peptide: 2175.3 pg/mL — ABNORMAL HIGH (ref 0.0–100.0)

## 2019-06-09 LAB — HIV ANTIBODY (ROUTINE TESTING W REFLEX): HIV Screen 4th Generation wRfx: NONREACTIVE

## 2019-06-09 LAB — STREP PNEUMONIAE URINARY ANTIGEN: Strep Pneumo Urinary Antigen: POSITIVE — AB

## 2019-06-09 LAB — MAGNESIUM: Magnesium: 1.6 mg/dL — ABNORMAL LOW (ref 1.7–2.4)

## 2019-06-09 LAB — SARS CORONAVIRUS 2 (TAT 6-24 HRS): SARS Coronavirus 2: NEGATIVE

## 2019-06-09 MED ORDER — ALBUTEROL SULFATE (2.5 MG/3ML) 0.083% IN NEBU
2.5000 mg | INHALATION_SOLUTION | RESPIRATORY_TRACT | Status: DC | PRN
  Administered 2019-06-14 – 2019-06-15 (×2): 2.5 mg via RESPIRATORY_TRACT
  Filled 2019-06-09: qty 3

## 2019-06-09 MED ORDER — SODIUM CHLORIDE 0.9 % IV SOLN
1.0000 g | INTRAVENOUS | Status: DC
  Administered 2019-06-09: 22:00:00 1 g via INTRAVENOUS
  Filled 2019-06-09 (×2): qty 10

## 2019-06-09 MED ORDER — WARFARIN SODIUM 2.5 MG PO TABS
2.5000 mg | ORAL_TABLET | ORAL | Status: DC
  Administered 2019-06-11: 19:00:00 2.5 mg via ORAL
  Filled 2019-06-09 (×3): qty 1

## 2019-06-09 MED ORDER — WARFARIN SODIUM 5 MG PO TABS
5.0000 mg | ORAL_TABLET | ORAL | Status: DC
  Administered 2019-06-09 – 2019-06-17 (×8): 5 mg via ORAL
  Filled 2019-06-09 (×8): qty 1

## 2019-06-09 MED ORDER — SODIUM CHLORIDE 0.9 % IV SOLN
INTRAVENOUS | Status: AC
  Administered 2019-06-09: 22:00:00 via INTRAVENOUS

## 2019-06-09 MED ORDER — FUROSEMIDE 40 MG PO TABS
40.0000 mg | ORAL_TABLET | Freq: Two times a day (BID) | ORAL | Status: DC
  Administered 2019-06-09: 40 mg via ORAL
  Filled 2019-06-09: qty 2

## 2019-06-09 MED ORDER — DIGOXIN 125 MCG PO TABS
0.1250 mg | ORAL_TABLET | Freq: Every day | ORAL | Status: DC
  Administered 2019-06-09 – 2019-06-18 (×10): 0.125 mg via ORAL
  Filled 2019-06-09 (×10): qty 1

## 2019-06-09 MED ORDER — MOMETASONE FURO-FORMOTEROL FUM 100-5 MCG/ACT IN AERO
2.0000 | INHALATION_SPRAY | Freq: Two times a day (BID) | RESPIRATORY_TRACT | Status: DC
  Administered 2019-06-09 – 2019-06-17 (×16): 2 via RESPIRATORY_TRACT
  Filled 2019-06-09: qty 8.8

## 2019-06-09 MED ORDER — SODIUM CHLORIDE 0.9 % IV SOLN
2.0000 g | Freq: Once | INTRAVENOUS | Status: AC
  Administered 2019-06-09: 2 g via INTRAVENOUS
  Filled 2019-06-09: qty 2

## 2019-06-09 MED ORDER — WARFARIN - PHYSICIAN DOSING INPATIENT
Freq: Every day | Status: DC
  Administered 2019-06-12: 21:00:00

## 2019-06-09 MED ORDER — SODIUM CHLORIDE 0.9% FLUSH
3.0000 mL | Freq: Once | INTRAVENOUS | Status: AC
  Administered 2019-06-09: 13:00:00 3 mL via INTRAVENOUS

## 2019-06-09 MED ORDER — VANCOMYCIN HCL IN DEXTROSE 750-5 MG/150ML-% IV SOLN: 750.0000 mg | Freq: Two times a day (BID) | INTRAVENOUS | Status: DC

## 2019-06-09 MED ORDER — VANCOMYCIN HCL 10 G IV SOLR
1250.0000 mg | Freq: Once | INTRAVENOUS | Status: DC
  Administered 2019-06-09: 16:00:00 1250 mg via INTRAVENOUS
  Filled 2019-06-09: qty 1250

## 2019-06-09 MED ORDER — LOSARTAN POTASSIUM 25 MG PO TABS
12.5000 mg | ORAL_TABLET | Freq: Every day | ORAL | Status: DC
  Administered 2019-06-09: 18:00:00 12.5 mg via ORAL
  Filled 2019-06-09: qty 0.5

## 2019-06-09 MED ORDER — SPIRONOLACTONE 25 MG PO TABS
25.0000 mg | ORAL_TABLET | Freq: Every day | ORAL | Status: DC
  Administered 2019-06-09: 18:00:00 25 mg via ORAL
  Filled 2019-06-09: qty 1

## 2019-06-09 MED ORDER — ENOXAPARIN SODIUM 40 MG/0.4ML ~~LOC~~ SOLN: 40.0000 mg | SUBCUTANEOUS | Status: DC

## 2019-06-09 MED ORDER — ASPIRIN EC 81 MG PO TBEC
81.0000 mg | DELAYED_RELEASE_TABLET | Freq: Every day | ORAL | Status: DC
  Administered 2019-06-09 – 2019-06-16 (×8): 81 mg via ORAL
  Filled 2019-06-09 (×8): qty 1

## 2019-06-09 MED ORDER — SODIUM CHLORIDE 0.9 % IV SOLN: 2.0000 g | Freq: Three times a day (TID) | INTRAVENOUS | Status: DC

## 2019-06-09 MED ORDER — SODIUM CHLORIDE 0.9 % IV SOLN
500.0000 mg | INTRAVENOUS | Status: DC
  Administered 2019-06-09: 18:00:00 500 mg via INTRAVENOUS
  Filled 2019-06-09: qty 500

## 2019-06-09 NOTE — Progress Notes (Signed)
Pharmacy Antibiotic Note  Mark [name] is a 43 y.o. male admitted on 06/09/2019 with pneumonia.  Pharmacy has been consulted for vancomycin and cefepime dosing. Pt is afebrile and WBC is elevated at 13.1. Scr is elevated as well at 1.3.   Plan: Vancomycin 1250mg  IV x 1 then 750mg  IV Q12H Cefepime 2gm IV Q8H F/u renal fxn, C&S, clinical status and peak/trough at SS  Height: 5\' 9"  (175.3 cm) Weight: 148 lb 3.2 oz (67.2 kg) IBW/kg (Calculated) : 70.7  Temp (24hrs), Avg:99.4 F (37.4 C), Min:99.4 F (37.4 C), Max:99.4 F (37.4 C)  Recent Labs  Lab 06/09/19 1140  WBC 13.1*  CREATININE 1.30*    Estimated Creatinine Clearance: 68.2 mL/min (A) (by C-G formula based on SCr of 1.3 mg/dL (H)).    Allergies  Allergen Reactions  . Clindamycin/Lincomycin Swelling and Palpitations  . Hydrocodone Hives  . Lisinopril Swelling and Other (See Comments)    Facial swelling/angioedema  . Olopatadine Hcl Swelling    Makes his face swell up  . Prednisone Shortness Of Breath, Nausea Only, Swelling and Other (See Comments)    Also made chest feel tight and genital area, legs, and face became swollen badly  . Bactrim [Sulfamethoxazole-Trimethoprim] Other (See Comments)    unknown  . Penicillins Hives and Swelling     Has patient had a PCN reaction causing immediate rash, facial/tongue/throat swelling, SOB or lightheadedness with hypotension: Yes Has patient had a PCN reaction causing severe rash involving mucus membranes or skin necrosis: No Has patient had a PCN reaction that required hospitalization: No Has patient had a PCN reaction occurring within the last 10 years: No If all of the above answers are "NO", then may proceed with Cephalosporin use.     Antimicrobials this admission: Vanc 10/18>> Cefepime 10/18>>  Dose adjustments this admission: N/A  Microbiology results: Pending  Thank you for allowing pharmacy to be a part of this patient's care.  Mickey Hebel, Rande Lawman 06/09/2019 2:02 PM

## 2019-06-09 NOTE — ED Notes (Signed)
Asked pt if he had reached out to family or friends or needed help doing so. He declined.

## 2019-06-09 NOTE — ED Provider Notes (Signed)
[facility] EMERGENCY DEPARTMENT Provider Note   CSN: [id number] Arrival date & time: 06/09/19  1135     History   Chief Complaint Chief Complaint  Patient presents with  . Cough    HPI LENN VOLKER is a 43 y.o. male.     HPI  43 year old male with cough, body aches and acute on chronic dyspnea.  Reports generalized body aches for about 2 weeks.  "I hurt all over."  Reports cough productive for yellow sputum over the past several days.  He has felt "hot and cold."  His son actually taken his temperature.  He reports compliance with his medications aside from not taking them yet today.  He does not weigh himself regularly.  He does not feel like he has significant acute edema.  Chronic orthopnea.  Past Medical History:  Diagnosis Date  . Asthma   . Chronic systolic CHF (congestive heart failure) ([place])   . Cigarette smoker   . CKD (chronic kidney disease), stage II    Archie Endo 10/01/2017  . COPD (chronic obstructive pulmonary disease) ([place]) 10/21/2017   on CT scan chest  . History of echocardiogram    a. Echo 5/17 - EF 20-25%, severe diff HK, restrictive physiology, mild to mod MR, severe reduced RVSF, mod RVE, mild RAE, mod TR, PASP 48 mmHg  . Hx of cardiac cath    a. LHC 5/17 - normal coronary arteries. PA 45/25, mean 33, PCWP mean 18  . NICM (nonischemic cardiomyopathy) ([place])   . Stroke ([place]) 09/27/2017   "was weak on my left side; I'm fully recovered" (11/09/2017)  . Substance abuse ([place])    cocaine, marijuana    Patient Active Problem List   Diagnosis Date Noted  . Malnutrition of moderate degree 04/10/2019  . Cardiogenic shock ([place]) 04/09/2019  . Allergic reaction caused by a drug 02/14/2019  . Abnormal liver function 02/14/2019  . Encounter for therapeutic drug monitoring 02/13/2019  . Encounter to establish care 02/13/2019  . 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 Acute CHF (congestive heart failure) ([place]) 01/24/2019  . Shortness of breath    . Cocaine use   . Acute on chronic systolic CHF (congestive heart failure) ([place]) 12/25/2018  . Demand ischemia ([place])   . LV (left ventricular) mural thrombus without MI ([place])   . Noncompliance   . Chronic anemia   . Acute systolic CHF (congestive heart failure) ([place]) 11/09/2017  . History of right MCA stroke 09/28/2017  . Stroke ([place]) 09/28/2017  . Chronic systolic heart failure ([place]) 01/11/2016  . NICM (nonischemic cardiomyopathy) ([place]) 01/11/2016  . CKD (chronic kidney disease) stage 2, GFR 60-89 ml/min 01/11/2016  . Cocaine abuse ([place]) 01/11/2016  . Chest pain, pleuritic 01/03/2016  . Tobacco abuse 01/03/2016  . Asthma 01/03/2016  . Leg swelling 01/03/2016  . Tachycardia 01/03/2016  . Normocytic anemia 01/03/2016  . Elevated troponin I level 01/03/2016  . Right rib fracture 01/03/2016  . Chest pain     Past Surgical History:  Procedure Laterality Date  . CARDIAC CATHETERIZATION N/A 01/05/2016   Procedure: Right/Left Heart Cath and Coronary Angiography;  Surgeon: Troy Sine, MD;  Location: [facility];  Service: Cardiovascular;  Laterality: N/A;  . MULTIPLE EXTRACTIONS WITH ALVEOLOPLASTY Bilateral 04/11/2019   Procedure: Extraction of tooth #'s 2, 4, 5, 10, and 12 with alveoloplasty;  Surgeon: Lenn Cal, DDS;  Location: [facility];  Service: Oral Surgery;  Laterality: Bilateral;  . RIGHT HEART CATH N/A 04/09/2019  Procedure: RIGHT HEART CATH;  Surgeon: Dolores PattyBensimhon, Daniel R, MD;  [facility];  Service: Cardiovascular;  Laterality: N/A;        Home Medications    Prior to Admission medications   Medication Sig Start Date End Date Taking? Authorizing Provider  acetaminophen (TYLENOL) 500 MG tablet Take 1,000 mg by mouth every 6 (six) hours as needed for moderate pain.    [provider]  albuterol (VENTOLIN HFA) 108 (90 Base) MCG/ACT inhaler Inhale 1-2 puffs into the lungs every 4 (four) hours as needed for wheezing or shortness of  breath. 04/26/19   Hoy RegisterNewlin, Enobong, MD  aspirin 81 MG EC tablet Take 1 tablet (81 mg total) by mouth daily. 12/30/18   Sheikh, Omair Latif, DO  budesonide-formoterol (SYMBICORT) 80-4.5 MCG/ACT inhaler Inhale 2 puffs into the lungs 2 (two) times daily. 04/13/19   Creig HinesBerge, Christopher Ronald, NP  digoxin (LANOXIN) 0.125 MG tablet Take 1 tablet (0.125 mg total) by mouth daily. 12/30/18   Marguerita MerlesSheikh, Omair Latif, DO  ferrous sulfate 325 (65 FE) MG EC tablet Take 325 mg by mouth daily.    [provider]  furosemide (LASIX) 80 MG tablet Take 1 tablet (80 mg total) by mouth every morning AND 0.5 tablets (40 mg total) every evening. 05/20/19   Clegg, Amy D, NP  losartan (COZAAR) 25 MG tablet Take 0.5 tablets (12.5 mg total) by mouth daily. 05/14/19   Clegg, Amy D, NP  Multiple Vitamin (MULTIVITAMIN WITH MINERALS) TABS tablet Take 1 tablet by mouth daily.    [provider]  potassium chloride SA (K-DUR) 20 MEQ tablet Take 1 tablet (20 mEq total) by mouth daily. Take 40meq with Metolazone. 03/08/19   Bensimhon, Bevelyn Bucklesaniel R, MD  spironolactone (ALDACTONE) 25 MG tablet Take 1 tablet (25 mg total) by mouth daily. 05/14/19   Tonye Becketlegg, Amy D, NP  warfarin (COUMADIN) 5 MG tablet 5 mg daily except 2.5 mg on Tuesday 05/13/19   Tonye Becketlegg, Amy D, NP    Family History Family History  Problem Relation Age of Onset  . Cardiomyopathy Father        Reports his father has an LVAD  . Heart failure Father   . Hypertension Father   . Heart disease Maternal Grandmother        had a whole in her heart  . Deep vein thrombosis Neg Hx     Social History Social History   Tobacco Use  . Smoking status: Former Smoker    Packs/day: 1.00    Years: 30.00    Pack years: 30.00    Types: Cigarettes    Quit date: 09/27/2017    Years since quitting: 1.6  . Smokeless tobacco: Never Used  Substance Use Topics  . Alcohol use: Yes    Alcohol/week: 5.0 standard drinks    Types: 5 Shots of liquor per week    Frequency: Never     Comment: 5-6 shots of vodka daily; "stopped it all after I had stroke 09/27/2017"  . Drug use: Yes    Types: Cocaine, Marijuana    Comment: 11/09/2017 "none since 09/27/2017"     Allergies   Clindamycin/lincomycin, Hydrocodone, Lisinopril, Olopatadine hcl, Prednisone, Bactrim [sulfamethoxazole-trimethoprim], and Penicillins   Review of Systems Review of Systems  All systems reviewed and negative, other than as noted in HPI.  Physical Exam Updated Vital Signs BP 91/72   Pulse (!) 112   Temp 99.4 F (37.4 C) (Oral)   Resp 20   SpO2 97%  Comment: Simultaneous filing. User may not have seen previous data.  Physical Exam Vitals signs and nursing note reviewed.  Constitutional:      General: He is not in acute distress.    Appearance: He is well-developed.     Comments: Tired appearing, but nontoxic.  HENT:     Head: Normocephalic and atraumatic.  Eyes:     General:        Right eye: No discharge.        Left eye: No discharge.     Conjunctiva/sclera: Conjunctivae normal.  Neck:     Musculoskeletal: Neck supple.  Cardiovascular:     Rate and Rhythm: Regular rhythm. Tachycardia present.     Heart sounds: Normal heart sounds. No murmur. No friction rub. No gallop.   Pulmonary:     Comments: Mild tachypnea.  Coarse breath sounds bilateral bases. Abdominal:     General: There is no distension.     Palpations: Abdomen is soft.     Tenderness: There is no abdominal tenderness.  Musculoskeletal:        General: No tenderness.     Comments: Minimal/mild edema left lower extremity  Skin:    General: Skin is warm and dry.  Neurological:     Mental Status: He is alert.  Psychiatric:        Behavior: Behavior normal.        Thought Content: Thought content normal.      ED Treatments / Results  Labs (all labs ordered are listed, but only abnormal results are displayed) Labs Reviewed  BASIC METABOLIC PANEL - Abnormal; Notable for the following components:      Result Value    Sodium 129 (*)    Chloride 93 (*)    Glucose, Bld 107 (*)    BUN 21 (*)    Creatinine, Ser 1.30 (*)    Calcium 8.8 (*)    All other components within normal limits  CBC - Abnormal; Notable for the following components:   WBC 13.1 (*)    Hemoglobin 12.8 (*)    RDW 16.6 (*)    All other components within normal limits  BRAIN NATRIURETIC PEPTIDE - Abnormal; Notable for the following components:   B Natriuretic Peptide 2,175.3 (*)    All other components within normal limits  SARS CORONAVIRUS 2 (TAT 6-24 HRS)  URINALYSIS, ROUTINE W REFLEX MICROSCOPIC  PROTIME-INR  DIGOXIN LEVEL    EKG EKG Interpretation  Date/Time:  Sunday June 09 2019 12:54:26 EDT Ventricular Rate:  104 PR Interval:    QRS Duration: 110 QT Interval:  331 QTC Calculation: 436 R Axis:   -98 Text Interpretation:  Sinus tachycardia Probable left atrial enlargement Left anterior fascicular block Abnormal lateral Q waves Anterior infarct, old Confirmed by Raeford Razor [phone]) on 06/09/2019 1:02:54 PM   Radiology Dg Chest Portable 1 View  Result Date: 06/09/2019 CLINICAL DATA:  Dyspnea EXAM: PORTABLE CHEST 1 VIEW COMPARISON:  05/08/2019 FINDINGS: Patchy opacity in the medial right lower lung, overlying the right heart border, favored to be in the right middle lobe. Left lung is clear. No pleural effusion or pneumothorax. The heart is mildly enlarged. IMPRESSION: Right middle lobe pneumonia. Electronically Signed   By: Charline Bills M.D.   On: 06/09/2019 13:15    Procedures Procedures (including critical care time)  Medications Ordered in ED Medications  sodium chloride flush (NS) 0.9 % injection 3 mL (3 mLs Intravenous Given 06/09/19 1321)     Initial Impression / Assessment  and Plan / ED Course  I have reviewed the triage vital signs and the nursing notes.  Pertinent labs & imaging results that were available during my care of the patient were reviewed by me and considered in my medical decision  making (see chart for details).    43 year old male with cough, body aches and acute on chronic dyspnea.  He has underlying history of severe nonischemic cardiomyopathy.  I think his more acute symptoms are related to pneumonia though.  He has purulent appearing sputum.  He has a leukocytosis.  Chest x-ray is most consistent with a right-sided infiltrate/pneumonia.  He reports no acute swelling.  Does not appear to be overtly volume overloaded on exam.  Will weigh.  BNP elevated, but down from his recent hospitalization last month.  Covid testing is pending.  ZYDEN SUMAN was evaluated in Emergency Department on 06/09/2019 for the symptoms described in the history of present illness. He was evaluated in the context of the global COVID-19 pandemic, which necessitated consideration that the patient might be at risk for infection with the SARS-CoV-2 virus that causes COVID-19. Institutional protocols and algorithms that pertain to the evaluation of patients at risk for COVID-19 are in a state of rapid change based on information released by regulatory bodies including the CDC and federal and state organizations. These policies and algorithms were followed during the patient's care in the ED.   Final Clinical Impressions(s) / ED Diagnoses   Final diagnoses:  HCAP (healthcare-associated pneumonia)    ED Discharge Orders    None       Raeford Razor, MD 06/09/19 1415

## 2019-06-09 NOTE — ED Notes (Signed)
Calling RT to ask for CPAP as pt requested.

## 2019-06-09 NOTE — ED Notes (Signed)
Called [facility] to let them now pt status is changing and he will not be coming there.

## 2019-06-09 NOTE — ED Notes (Signed)
Text paging provider that may be covering for this pt.

## 2019-06-09 NOTE — ED Notes (Signed)
Paged Dr. Doristine Bosworth regarding change in pt bp.

## 2019-06-09 NOTE — ED Notes (Addendum)
Per the pt request I contacted RT and admitting as he said someone told him he would be placed on CPAP. RT had no orders or indication. Attending Dr. Doristine Bosworth, was also contacted and review his chart and condition and had no indication that the pt should be on CPAP. I communicated this to the pt. I also asked admitting if someone could see the pt to reassure him. I received an inhaler from [facility] and offered it the pt. I also revisited if the pt would take his warfarin at this time and he refused to answer.

## 2019-06-09 NOTE — ED Notes (Signed)
Requested phlebotomy to draw LA, cultures.

## 2019-06-09 NOTE — H&P (Signed)
History and Physical    Mark [name] Kibby OZH:[id number] DOB: 09/24/1975 DOA: 06/09/2019  PCP: Grayce SessionsEdwards, Michelle P, NP  Patient coming from: Home I have personally briefly reviewed [facility]  Chief Complaint: Productive cough, subjective fever, generalized body ache since 2 weeks.  HPI: Mark [name] Monestime is a 43 y.o. male with medical history significant of nonischemic cardiomyopathy with ejection fraction of 10 to 15%, MCA CVA in 2019, asthma, polysubstance abuse, medication noncompliance presents to emergency department due to productive cough, subjective fever, generalized body aches since 2 weeks.  Patient reports productive cough with greenish sputum and sometimes he sees blood in it, associated with subjective fever, shortness of breath, wheezing, chest pain with cough, body ache, decreased appetite, generalized weakness and fatigue.  Denies recent travel, COVID-19 exposure, night sweats, weight loss, sore throat, runny nose, loss of sense of taste or smell.  Patient admitted multiple times this year due to acute on chronic systolic congestive heart failure.  Patient denies worsening leg swelling, orthopnea, PND, palpitation.  Reports being compliant with his medications.  He lives with his friend and smoke couple of cigarettes per day, drinks alcohol occasionally and uses marijuana and cocaine.   ED Course: Upon arrival: Patient afebrile, tachycardic, leukocytosis of 13.1, proBNP: 2175 -less than his previous levels.  Chest x-ray was obtained which came back positive for right middle lobe pneumonia.  Patient received IV Vanco and cefepime.  COVID-19: Pending.  Review of Systems: As per HPI otherwise negative.    Past Medical History:  Diagnosis Date  . Asthma   . Chronic systolic CHF (congestive heart failure) (HCC)   . Cigarette smoker   . CKD (chronic kidney disease), stage II    [name]/notes 10/01/2017  . COPD (chronic obstructive pulmonary  disease) (HCC) 10/21/2017   on CT scan chest  . History of echocardiogram    a. Echo 5/17 - EF 20-25%, severe diff HK, restrictive physiology, mild to mod MR, severe reduced RVSF, mod RVE, mild RAE, mod TR, PASP 48 mmHg  . Hx of cardiac cath    a. LHC 5/17 - normal coronary arteries. PA 45/25, mean 33, PCWP mean 18  . NICM (nonischemic cardiomyopathy) (HCC)   . Stroke (HCC) 09/27/2017   "was weak on my left side; I'm fully recovered" (11/09/2017)  . Substance abuse (HCC)    cocaine, marijuana    Past Surgical History:  Procedure Laterality Date  . CARDIAC CATHETERIZATION N/A 01/05/2016   Procedure: Right/Left Heart Cath and Coronary Angiography;  Surgeon: Lennette Biharihomas A Kelly, MD;  [facility];  Service: Cardiovascular;  Laterality: N/A;  . MULTIPLE EXTRACTIONS WITH ALVEOLOPLASTY Bilateral 04/11/2019   Procedure: Extraction of tooth #'s 2, 4, 5, 10, and 12 with alveoloplasty;  Surgeon: Charlynne PanderKulinski, Ronald F, DDS;  Location: [facility] OR;  Service: Oral Surgery;  Laterality: Bilateral;  . RIGHT HEART CATH N/A 04/09/2019   Procedure: RIGHT HEART CATH;  Surgeon: Dolores PattyBensimhon, Daniel R, MD;  [facility];  Service: Cardiovascular;  Laterality: N/A;     reports that he quit smoking about 20 months ago. His smoking use included cigarettes. He has a 30.00 pack-year smoking history. He has never used smokeless tobacco. He reports current alcohol use of about 5.0 standard drinks of alcohol per week. He reports current drug use. Drugs: Cocaine and Marijuana.  Allergies  Allergen Reactions  . Clindamycin/Lincomycin Swelling and Palpitations  . Hydrocodone Hives  . Lisinopril Swelling and Other (See  Comments)    Facial swelling/angioedema  . Olopatadine Hcl Swelling    Makes his face swell up  . Prednisone Shortness Of Breath, Nausea Only, Swelling and Other (See Comments)    Also made chest feel tight and genital area, legs, and face became swollen badly  . Bactrim  [Sulfamethoxazole-Trimethoprim] Other (See Comments)    unknown  . Penicillins Hives and Swelling     Has patient had a PCN reaction causing immediate rash, facial/tongue/throat swelling, SOB or lightheadedness with hypotension: Yes Has patient had a PCN reaction causing severe rash involving mucus membranes or skin necrosis: No Has patient had a PCN reaction that required hospitalization: No Has patient had a PCN reaction occurring within the last 10 years: No If all of the above answers are "NO", then may proceed with Cephalosporin use.     Family History  Problem Relation Age of Onset  . Cardiomyopathy Father        Reports his father has an LVAD  . Heart failure Father   . Hypertension Father   . Heart disease Maternal Grandmother        had a whole in her heart  . Deep vein thrombosis Neg Hx     Prior to Admission medications   Medication Sig Start Date End Date Taking? Authorizing Provider  acetaminophen (TYLENOL) 500 MG tablet Take 1,000 mg by mouth every 6 (six) hours as needed for moderate pain.    [provider]  albuterol (VENTOLIN HFA) 108 (90 Base) MCG/ACT inhaler Inhale 1-2 puffs into the lungs every 4 (four) hours as needed for wheezing or shortness of breath. 04/26/19   Hoy Register, MD  aspirin 81 MG EC tablet Take 1 tablet (81 mg total) by mouth daily. 12/30/18   Sheikh, Omair Latif, DO  budesonide-formoterol (SYMBICORT) 80-4.5 MCG/ACT inhaler Inhale 2 puffs into the lungs 2 (two) times daily. 04/13/19   Creig Hines, NP  digoxin (LANOXIN) 0.125 MG tablet Take 1 tablet (0.125 mg total) by mouth daily. 12/30/18   Marguerita Merles Latif, DO  ferrous sulfate 325 (65 FE) MG EC tablet Take 325 mg by mouth daily.    [provider]  furosemide (LASIX) 80 MG tablet Take 1 tablet (80 mg total) by mouth every morning AND 0.5 tablets (40 mg total) every evening. 05/20/19   Clegg, Amy D, NP  losartan (COZAAR) 25 MG tablet Take 0.5 tablets (12.5 mg  total) by mouth daily. 05/14/19   Clegg, Amy D, NP  Multiple Vitamin (MULTIVITAMIN WITH MINERALS) TABS tablet Take 1 tablet by mouth daily.    [provider]  potassium chloride SA (K-DUR) 20 MEQ tablet Take 1 tablet (20 mEq total) by mouth daily. Take with Metolazone. 03/08/19   Bensimhon, Bevelyn Buckles, MD  spironolactone (ALDACTONE) 25 MG tablet Take 1 tablet (25 mg total) by mouth daily. 05/14/19   Tonye Becket D, NP  warfarin (COUMADIN) 5 MG tablet 5 mg daily except 2.5 mg on Tuesday 05/13/19   Tonye Becket D, NP    Physical Exam: Vitals:   06/09/19 1137 06/09/19 1328  BP: 91/72   Pulse: (!) 112   Resp: 20   Temp: 99.4 F (37.4 [name])   TempSrc: Oral   SpO2: 97%   Weight:  67.2 kg  Height:  5\' 9"  (1.753 m)    Constitutional: NAD, calm, comfortable Vitals:   06/09/19 1137 06/09/19 1328  BP: 91/72   Pulse: (!) 112   Resp: 20   Temp: 99.4  F (37.4 [name])   TempSrc: Oral   SpO2: 97%   Weight:  67.2 kg  Height:  5\' 9"  (1.753 m)   Constitutional: Alert and oriented x3, not in acute distress, communicating well.   Eyes: PERRL, lids and conjunctivae normal ENMT: Mucous membranes are moist. Posterior pharynx clear of any exudate or lesions.Normal dentition.  Neck: normal, supple, no masses, no thyromegaly Respiratory: clear to auscultation bilaterally, no wheezing, no crackles. Normal respiratory effort. No accessory muscle use.  Cardiovascular: Regular rate and rhythm, no murmurs / rubs / gallops. No extremity edema. 2+ pedal pulses. No carotid bruits.  Abdomen: no tenderness, no masses palpated. No hepatosplenomegaly. Bowel sounds positive.  Musculoskeletal: no clubbing / cyanosis. No joint deformity upper and lower extremities. Good ROM, no contractures. Normal muscle tone.  Skin: no rashes, lesions, ulcers. No induration Neurologic: CN 2-12 grossly intact. Sensation intact, DTR normal. Strength 5/5 in all 4.  Psychiatric: Normal judgment and insight. Alert and oriented x 3.  Normal mood.    Labs on Admission: I have personally reviewed following labs and imaging studies  CBC: Recent Labs  Lab 06/09/19 1140  WBC 13.1*  HGB 12.8*  HCT 39.4  MCV 80.2  PLT 952   Basic Metabolic Panel: Recent Labs  Lab 06/09/19 1140  NA 129*  K 5.1  CL 93*  CO2 24  GLUCOSE 107*  BUN 21*  CREATININE 1.30*  CALCIUM 8.8*   GFR: Estimated Creatinine Clearance: 68.2 mL/min (A) (by [name]-G formula based on SCr of 1.3 mg/dL (H)). Liver Function Tests: No results for input(s): AST, ALT, ALKPHOS, BILITOT, PROT, ALBUMIN in the last 168 hours. No results for input(s): LIPASE, AMYLASE in the last 168 hours. No results for input(s): AMMONIA in the last 168 hours. Coagulation Profile: Recent Labs  Lab 06/09/19 1406  INR 2.3*   Cardiac Enzymes: No results for input(s): CKTOTAL, CKMB, CKMBINDEX, TROPONINI in the last 168 hours. BNP (last 3 results) No results for input(s): PROBNP in the last 8760 hours. HbA1C: No results for input(s): HGBA1C in the last 72 hours. CBG: No results for input(s): GLUCAP in the last 168 hours. Lipid Profile: No results for input(s): CHOL, HDL, LDLCALC, TRIG, CHOLHDL, LDLDIRECT in the last 72 hours. Thyroid Function Tests: No results for input(s): TSH, T4TOTAL, FREET4, T3FREE, THYROIDAB in the last 72 hours. Anemia Panel: No results for input(s): VITAMINB12, FOLATE, FERRITIN, TIBC, IRON, RETICCTPCT in the last 72 hours. Urine analysis:    Component Value Date/Time   COLORURINE STRAW (A) 01/24/2019 1955   APPEARANCEUR CLEAR 01/24/2019 1955   LABSPEC 1.005 01/24/2019 1955   PHURINE 5.0 01/24/2019 1955   GLUCOSEU NEGATIVE 01/24/2019 1955   HGBUR NEGATIVE 01/24/2019 1955   BILIRUBINUR NEGATIVE 01/24/2019 1955   BILIRUBINUR negative 12/01/2017 0910   KETONESUR NEGATIVE 01/24/2019 1955   PROTEINUR NEGATIVE 01/24/2019 1955   UROBILINOGEN 2.0 (A) 12/01/2017 0910   NITRITE NEGATIVE 01/24/2019 1955   LEUKOCYTESUR NEGATIVE 01/24/2019 1955     Radiological Exams on Admission: Dg Chest Portable 1 View  Result Date: 06/09/2019 CLINICAL DATA:  Dyspnea EXAM: PORTABLE CHEST 1 VIEW COMPARISON:  05/08/2019 FINDINGS: Patchy opacity in the medial right lower lung, overlying the right heart border, favored to be in the right middle lobe. Left lung is clear. No pleural effusion or pneumothorax. The heart is mildly enlarged. IMPRESSION: Right middle lobe pneumonia. Electronically Signed   By: Julian Hy M.D.   On: 06/09/2019 13:15    EKG: Sinus tachycardia, no ST elevation or  depression noted. Assessment/Plan Principal Problem:   Right middle lobe pneumonia Active Problems:   Tobacco abuse   Asthma   Chronic systolic heart failure (HCC)   NICM (nonischemic cardiomyopathy) (HCC)   History of right MCA stroke   Polysubstance abuse (HCC)   Chronic hyponatremia   Right middle lobe pneumonia: -Chest x-ray as above. -We will admit patient for close monitoring. -Patient is afebrile with leukocytosis of 13.1, order procalcitonin level, blood culture-pending.  COVID-19 is pending. -Patient received IV Vanco and cefepime in the ED.  Will start patient on Rocephin and azithromycin  -Check urine Legionella antigen and urine strep pneumo antigen. -On continuous pulse ox.  On telemetry. -Monitor vitals closely.  Chronic systolic congestive heart failure y: With ejection fraction of 10 to 15%. -History of recurrent hospitalization.  NICM by cardiac cath in 2017. -Patient is not in fluid overload.  Chest x-ray is negative for fluid overload.  No leg edema, orthopnea or PND. -proBNP: 2175-less than previous levels. -Strict INO's and daily weight.  Monitor electrolytes closely. -Continue aspirin,  Lasix, Aldactone, losartan and digoxin. -Check magnesium and phosphorus level.  LV thrombus: Noted on 5/20 echo -Continue Coumadin and monitor PT/INR closely.  Chronic hyponatremia: -Likely secondary to diuretics.  Patient is asymptomatic  -Monitor level closely.  Asthma: Well controlled. -Patient is maintaining oxygen saturation on room air.  No wheezing on exam. -Continue albuterol and Dulera  MCA CVA: In 2019 -No residual weakness. -Continue aspirin.  Polysubstance abuse: -Patient reports use of marijuana and cocaine -Discussed about cessation.  Tobacco abuse: -Discussed about cessation.   DVT prophylaxis: TED/SCD/Coumadin Code Status: Full code Family Communication: None present at bedside.  Plan of care discussed with patient in length and he verbalized understanding and agreed with it. Disposition Plan: TBD Consults called: None Admission status: Inpatient   Ollen Bowl MD [facility] Pager [phone]  If 7PM-7AM, please contact night-coverage [email or web address] Password [email or web address]  06/09/2019, 2:44 PM

## 2019-06-09 NOTE — ED Notes (Signed)
Report attempted 

## 2019-06-09 NOTE — ED Triage Notes (Signed)
Pt endorses cough, generalized body aches for 2-3 weeks. Hx of CHF with recent hospitalization. Endorses SOB.

## 2019-06-10 ENCOUNTER — Encounter (HOSPITAL_COMMUNITY): Payer: Self-pay

## 2019-06-10 DIAGNOSIS — I5023 Acute on chronic systolic (congestive) heart failure: Secondary | ICD-10-CM

## 2019-06-10 LAB — CBC WITH DIFFERENTIAL/PLATELET
Abs Immature Granulocytes: 0.03 10*3/uL (ref 0.00–0.07)
Basophils Absolute: 0 10*3/uL (ref 0.0–0.1)
Basophils Relative: 0 %
Eosinophils Absolute: 0 10*3/uL (ref 0.0–0.5)
Eosinophils Relative: 0 %
HCT: 34.9 % — ABNORMAL LOW (ref 39.0–52.0)
Hemoglobin: 11.2 g/dL — ABNORMAL LOW (ref 13.0–17.0)
Immature Granulocytes: 0 %
Lymphocytes Relative: 7 %
Lymphs Abs: 0.8 10*3/uL (ref 0.7–4.0)
MCH: 26 pg (ref 26.0–34.0)
MCHC: 32.1 g/dL (ref 30.0–36.0)
MCV: 81.2 fL (ref 80.0–100.0)
Monocytes Absolute: 1.7 10*3/uL — ABNORMAL HIGH (ref 0.1–1.0)
Monocytes Relative: 13 %
Neutro Abs: 9.8 10*3/uL — ABNORMAL HIGH (ref 1.7–7.7)
Neutrophils Relative %: 80 %
Platelets: 176 10*3/uL (ref 150–400)
RBC: 4.3 MIL/uL (ref 4.22–5.81)
RDW: 16.7 % — ABNORMAL HIGH (ref 11.5–15.5)
WBC: 12.3 10*3/uL — ABNORMAL HIGH (ref 4.0–10.5)
nRBC: 0 % (ref 0.0–0.2)

## 2019-06-10 LAB — COMPREHENSIVE METABOLIC PANEL
ALT: 19 U/L (ref 0–44)
AST: 35 U/L (ref 15–41)
Albumin: 2.5 g/dL — ABNORMAL LOW (ref 3.5–5.0)
Alkaline Phosphatase: 75 U/L (ref 38–126)
Anion gap: 11 (ref 5–15)
BUN: 24 mg/dL — ABNORMAL HIGH (ref 6–20)
CO2: 25 mmol/L (ref 22–32)
Calcium: 8 mg/dL — ABNORMAL LOW (ref 8.9–10.3)
Chloride: 94 mmol/L — ABNORMAL LOW (ref 98–111)
Creatinine, Ser: 1.52 mg/dL — ABNORMAL HIGH (ref 0.61–1.24)
GFR calc Af Amer: >60 mL/min (ref >60)
GFR calc non Af Amer: 55 mL/min — ABNORMAL LOW (ref >60)
Glucose, Bld: 127 mg/dL — ABNORMAL HIGH (ref 70–99)
Potassium: 4.1 mmol/L (ref 3.5–5.1)
Sodium: 130 mmol/L — ABNORMAL LOW (ref 135–145)
Total Bilirubin: 2.5 mg/dL — ABNORMAL HIGH (ref 0.3–1.2)
Total Protein: 6.1 g/dL — ABNORMAL LOW (ref 6.5–8.1)

## 2019-06-10 LAB — LACTIC ACID, PLASMA
Lactic Acid, Venous: 2.6 mmol/L (ref 0.5–1.9)
Lactic Acid, Venous: 1.4 mmol/L (ref 0.5–1.9)
Lactic Acid, Venous: 1.4 mmol/L (ref 0.5–1.9)

## 2019-06-10 LAB — LEGIONELLA PNEUMOPHILA SEROGP 1 UR AG: L. pneumophila Serogp 1 Ur Ag: NEGATIVE

## 2019-06-10 LAB — EXPECTORATED SPUTUM ASSESSMENT W GRAM STAIN, RFLX TO RESP C

## 2019-06-10 LAB — PROTIME-INR
INR: 2.6 — ABNORMAL HIGH (ref 0.8–1.2)
Prothrombin Time: 27.3 seconds — ABNORMAL HIGH (ref 11.4–15.2)

## 2019-06-10 LAB — MRSA PCR SCREENING: MRSA by PCR: NEGATIVE

## 2019-06-10 LAB — GLUCOSE, CAPILLARY: Glucose-Capillary: 148 mg/dL — ABNORMAL HIGH (ref 70–99)

## 2019-06-10 MED ORDER — ALBUMIN HUMAN 25 % IV SOLN
12.5000 g | Freq: Once | INTRAVENOUS | Status: AC
  Administered 2019-06-10: 12.5 g via INTRAVENOUS
  Filled 2019-06-10: qty 50

## 2019-06-10 MED ORDER — DIPHENHYDRAMINE HCL 25 MG PO CAPS
25.0000 mg | ORAL_CAPSULE | Freq: Every evening | ORAL | Status: DC | PRN
  Administered 2019-06-10 – 2019-06-11 (×2): 25 mg via ORAL
  Filled 2019-06-10 (×2): qty 1

## 2019-06-10 MED ORDER — IPRATROPIUM BROMIDE 0.02 % IN SOLN
0.5000 mg | RESPIRATORY_TRACT | Status: DC
  Administered 2019-06-10 – 2019-06-11 (×4): 0.5 mg via RESPIRATORY_TRACT
  Filled 2019-06-10 (×3): qty 2.5

## 2019-06-10 MED ORDER — SODIUM CHLORIDE 0.9 % IV BOLUS: 250.0000 mL | Freq: Once | INTRAVENOUS | Status: DC

## 2019-06-10 MED ORDER — LOSARTAN POTASSIUM 25 MG PO TABS: 12.5000 mg | ORAL_TABLET | Freq: Every day | ORAL | Status: DC

## 2019-06-10 MED ORDER — DOXYCYCLINE HYCLATE 100 MG PO TABS
100.0000 mg | ORAL_TABLET | Freq: Two times a day (BID) | ORAL | Status: AC
  Administered 2019-06-10 – 2019-06-13 (×7): 100 mg via ORAL
  Filled 2019-06-10 (×7): qty 1

## 2019-06-10 MED ORDER — SPIRONOLACTONE 25 MG PO TABS: 25.0000 mg | ORAL_TABLET | Freq: Every day | ORAL | Status: DC

## 2019-06-10 MED ORDER — IPRATROPIUM BROMIDE HFA 17 MCG/ACT IN AERS: 2.0000 | INHALATION_SPRAY | RESPIRATORY_TRACT | Status: DC

## 2019-06-10 MED ORDER — SODIUM CHLORIDE 0.9 % IV SOLN
INTRAVENOUS | Status: AC
  Administered 2019-06-10 (×2): via INTRAVENOUS

## 2019-06-10 MED ORDER — SODIUM CHLORIDE 0.9 % IV SOLN: INTRAVENOUS | Status: DC

## 2019-06-10 NOTE — Progress Notes (Signed)
PROGRESS NOTE    Mark [name]   XNA:[id number] DOB: [birth year] DOA: 06/09/2019  PCP: Kerin Perna, NP   Hospital Summary  Is a 44 year old male with past medical history of chronic systolic heart failure (EF 10 to 15% with left apical thrombus with severely diffuse hypokinesis 12/2018) NICM by cardiac cath in 2017, history of right MCA CVA 2019, recurrent cardiogenic shock, asthma, polysubstance abuse who presented to the ED on 10/18 with complaints of productive cough, fever and body aches x2 weeks and found to have WBC 13 with chest x-ray with right middle lobe pneumonia, Covid negative and on admission his weight was up 13 pounds from previous discharge.  He was started on Vanco/cefepime in ED and changed to Rocephin/azithromycin for CAP therapy and continued on remaining home meds.  Overnight 10/18-10/19 a rapid response for hypotension was called after patient received his home BP meds and patient received 250 cc bolus NS with slightly improved BP however again had drop in blood pressure.  He received albumin x2 for recurrent hypotension throughout the same day after his blood pressure meds were held.  He remained hypotensive but asymptomatic.  Cardiology was consulted who deemed patient not a candidate for advanced heart failure therapies due to RV dysfunction substance abuse and palliative care was consulted.  Patient was concerned about having an adverse reaction to azithromycin and was changed to doxycycline/Rocephin for CAP coverage  A & P   Principal Problem:   Right middle lobe pneumonia Active Problems:   Tobacco abuse   Asthma   Chronic systolic heart failure (HCC)   NICM (nonischemic cardiomyopathy) ([place])   History of right MCA stroke   Polysubstance abuse ([place])   Chronic hyponatremia   Strep pneumonia CAP in setting of right middle lobe pneumonia positive S pneumo urinary antigen, blood cultures with no growth to date, Covid negative.  Vancomycin/cefepime in the ED->  ceftriaxone/azithromycin.  Hypotension unlikely related to septic shock as patient is afebrile with improving leukocytosis.  Briefly elevated lactic acid level at 2.6 which quickly resolved.  Antibiotics switched to ceftriaxone/doxycycline due to patient's fear of potential adverse reaction to azithromycin.  No history of adverse reaction to doxycycline and has tolerated cephalosporins.    Chronic biventricular heart failure with low output history of nonischemic cardiomyopathy.  EF 5 to 10% in May 2020, RV severely dysfunctional.  Had RHC in August with low output and high filling pressure and required inotropic support, previously on milrinone while hospitalized but refused home milrinone.  Not a candidate for LVAD with right heart failure.  BNP elevated but at/below baseline.  Weight up but asymptomatic.  Ran out of spironolactone 2 days prior to admit.  Cardiology consulted . Hold heart failure meds: Losartan, spironolactone and Lasix . Not interested in inotropic support . Palliative care  Hypotension likely combination between sepsis and end-stage heart failure but unlikely septic shock.  Improving with gentle IV fluids and albumin . Continue to monitor for volume overload  Apical thrombus on Coumadin with INR 2.6 . Monitor  AKI likely secondary to hemodynamic changes and heart failure . Monitor with IV fluids  Chronic hyponatremia asymptomatic.  Likely from chronic heart failure . Monitor  Asthma well controlled . Monitor  Polysubstance abuse UDS negative, and history of cocaine in the past . Advised cessation  MCA CVA 2019 no residual weakness . Taking aspirin  Tobacco discussed cessation  DVT prophylaxis: Coumadin, SCD   Code Status: Full Code  Diet: Heart healthy  Disposition Plan:  Pending palliative consult  Consultants  . Palliative care . Cardiology  Procedures  . None  Antibiotics  Day 2/5-7 Received 1 day vancomycin/cefepime Received 1 day  ceftriaxone/azithromycin Switched to ceftriaxone/doxycycline on 10/19      Subjective   Patient seen and examined at bedside sitting upright in no acute distress and resting comfortably.  His blood pressure at bedside was in the 80s over 50s but he is asymptomatic and denies any chest pain, palpitations, lightheadedness.  He is anxious about taking azithromycin as he has had reactions to antibiotics in the past and is very concerned about taking medications.  Reactions he describes as feeling tingly all over. Otherwise denies any complaints  He is requesting CPAP today as he states he is used this in the past during other hospitalizations which helped him however does not have a history of OSA.  He states he is not short of breath right now.  SPO2 100% on room air currently.  I explained to him that he does not need CPAP at this time.   Objective   Vitals:   06/10/19 1628 06/10/19 1629 06/10/19 1700 06/10/19 1745  BP:  (!) 88/65 97/83 97/85   Pulse:      Resp:  (!) 27 (!) 21 16  Temp: 97.8 F (36.6 C)     TempSrc: Oral     SpO2:  100%    Weight:      Height:        Intake/Output Summary (Last 24 hours) at 06/10/2019 1835 Last data filed at 06/10/2019 1833 Gross per 24 hour  Intake 2085.1 ml  Output 1100 ml  Net 985.1 ml   Filed Weights   06/09/19 1328 06/10/19 0010  Weight: 67.2 kg 65.5 kg    Examination:  Physical Exam Vitals signs and nursing note reviewed.  Constitutional:      Appearance: Normal appearance.  HENT:     Head: Normocephalic and atraumatic.     Nose: Nose normal.     Mouth/Throat:     Mouth: Mucous membranes are moist.  Eyes:     Extraocular Movements: Extraocular movements intact.  Neck:     Musculoskeletal: Normal range of motion. No neck rigidity.  Cardiovascular:     Rate and Rhythm: Normal rate and regular rhythm.  Pulmonary:     Effort: Pulmonary effort is normal.     Breath sounds: Normal breath sounds.  Abdominal:     General:  Abdomen is flat.     Palpations: Abdomen is soft.  Musculoskeletal: Normal range of motion.  Neurological:     General: No focal deficit present.     Mental Status: He is alert. Mental status is at baseline.  Psychiatric:        Mood and Affect: Mood normal.        Behavior: Behavior normal.     Data Reviewed: I have personally reviewed following labs and imaging studies  CBC: Recent Labs  Lab 06/09/19 1140 06/10/19 0038  WBC 13.1* 12.3*  NEUTROABS  --  9.8*  HGB 12.8* 11.2*  HCT 39.4 34.9*  MCV 80.2 81.2  PLT 196 176   Basic Metabolic Panel: Recent Labs  Lab 06/09/19 1140 06/10/19 0038  NA 129* 130*  K 5.1 4.1  CL 93* 94*  CO2 24 25  GLUCOSE 107* 127*  BUN 21* 24*  CREATININE 1.30* 1.52*  CALCIUM 8.8* 8.0*  MG 1.6*  --   PHOS 2.6  --    GFR: Estimated Creatinine Clearance:  56.9 mL/min (A) (by C-G formula based on SCr of 1.52 mg/dL (H)). Liver Function Tests: Recent Labs  Lab 06/10/19 0038  AST 35  ALT 19  ALKPHOS 75  BILITOT 2.5*  PROT 6.1*  ALBUMIN 2.5*   No results for input(s): LIPASE, AMYLASE in the last 168 hours. No results for input(s): AMMONIA in the last 168 hours. Coagulation Profile: Recent Labs  Lab 06/09/19 1406 06/10/19 0038  INR 2.3* 2.6*   Cardiac Enzymes: No results for input(s): CKTOTAL, CKMB, CKMBINDEX, TROPONINI in the last 168 hours. BNP (last 3 results) No results for input(s): PROBNP in the last 8760 hours. HbA1C: No results for input(s): HGBA1C in the last 72 hours. CBG: Recent Labs  Lab 06/10/19 1153  GLUCAP 148*   Lipid Profile: No results for input(s): CHOL, HDL, LDLCALC, TRIG, CHOLHDL, LDLDIRECT in the last 72 hours. Thyroid Function Tests: No results for input(s): TSH, T4TOTAL, FREET4, T3FREE, THYROIDAB in the last 72 hours. Anemia Panel: No results for input(s): VITAMINB12, FOLATE, FERRITIN, TIBC, IRON, RETICCTPCT in the last 72 hours. Sepsis Labs: Recent Labs  Lab 06/09/19 1140 06/09/19 1540 06/10/19  0038 06/10/19 1110 06/10/19 1417  PROCALCITON 5.97  --   --   --   --   LATICACIDVEN  --  1.6 2.6* 1.4 1.4    Recent Results (from the past 240 hour(s))  SARS CORONAVIRUS 2 (TAT 6-24 HRS) Nasopharyngeal Nasopharyngeal Swab     Status: None   Collection Time: 06/09/19  1:30 PM   Specimen: Nasopharyngeal Swab  Result Value Ref Range Status   SARS Coronavirus 2 NEGATIVE NEGATIVE Final    Comment: (NOTE) SARS-CoV-2 target nucleic acids are NOT DETECTED. The SARS-CoV-2 RNA is generally detectable in upper and lower respiratory specimens during the acute phase of infection. Negative results do not preclude SARS-CoV-2 infection, do not rule out co-infections with other pathogens, and should not be used as the sole basis for treatment or other patient management decisions. Negative results must be combined with clinical observations, patient history, and epidemiological information. The expected result is Negative. Fact Sheet for Patients: HairSlick.nohttps://www.fda.gov/media/138098/download Fact Sheet for Healthcare Providers: quierodirigir.comhttps://www.fda.gov/media/138095/download This test is not yet approved or cleared by the [place] FDA and  has been authorized for detection and/or diagnosis of SARS-CoV-2 by FDA under an Emergency Use Authorization (EUA). This EUA will remain  in effect (meaning this test can be used) for the duration of the COVID-19 declaration under Section 56 4(b)(1) of the Act, 21 U.S.C. section 360bbb-3(b)(1), unless the authorization is terminated or revoked sooner. Performed at [facility], [address] [place], [place] [address]   Culture, blood (routine x 2)     Status: None (Preliminary result)   Collection Time: 06/09/19  3:40 PM   Specimen: BLOOD LEFT FOREARM  Result Value Ref Range Status   Specimen Description BLOOD LEFT FOREARM  Final   Special Requests   Final    BOTTLES DRAWN AEROBIC AND ANAEROBIC Blood Culture adequate volume   Culture   Final     NO GROWTH < 24 HOURS Performed at [facility], [address] [place], [place] [address]    Report Status PENDING  Incomplete  Culture, blood (routine x 2)     Status: None (Preliminary result)   Collection Time: 06/09/19  4:38 PM   Specimen: BLOOD RIGHT HAND  Result Value Ref Range Status   Specimen Description BLOOD RIGHT HAND  Final   Special Requests   Final  BOTTLES DRAWN AEROBIC AND ANAEROBIC Blood Culture results may not be optimal due to an inadequate volume of blood received in culture bottles   Culture   Final    NO GROWTH < 24 HOURS Performed at [facility], [address] [place], [place] [address]    Report Status PENDING  Incomplete         Radiology Studies: Dg Chest Portable 1 View  Result Date: 06/09/2019 CLINICAL DATA:  Dyspnea EXAM: PORTABLE CHEST 1 VIEW COMPARISON:  05/08/2019 FINDINGS: Patchy opacity in the medial right lower lung, overlying the right heart border, favored to be in the right middle lobe. Left lung is clear. No pleural effusion or pneumothorax. The heart is mildly enlarged. IMPRESSION: Right middle lobe pneumonia. Electronically Signed   By: Charline Bills M.D.   On: 06/09/2019 13:15        Scheduled Meds: . aspirin EC  81 mg Oral Daily  . digoxin  0.125 mg Oral Daily  . doxycycline  100 mg Oral Q12H  . furosemide  40 mg Oral BID  . ipratropium  0.5 mg Nebulization Q4H  . [START ON 06/11/2019] losartan  12.5 mg Oral Daily  . mometasone-formoterol  2 puff Inhalation BID  . [START ON 06/11/2019] spironolactone  25 mg Oral Daily  . [START ON 06/11/2019] warfarin  2.5 mg Oral Q Tue-1800  . warfarin  5 mg Oral Once per day on Sun Mon Wed Thu Fri Sat  . Warfarin - Physician Dosing Inpatient   Does not apply q1800   Continuous Infusions: . sodium chloride 50 mL/hr at 06/10/19 1422  . cefTRIAXone (ROCEPHIN)  IV 1 g (06/09/19 2220)     LOS: 1 day    Time spent: 45 minutes    Jae Dire, DO [facility] [phone]  If 7PM-7AM, please contact night-coverage [email or web address] Password [facility] 06/10/2019, 6:35 PM

## 2019-06-10 NOTE — Progress Notes (Signed)
Patient arrived to unit.  MEWS score yellow, therefore yellow MEWS guidelines implemented.  BP remaining low.  ED orders for low BP already implemented with NS at 75/hr and ED nurse stated MD told her a MAP above 60 was okay for this patient.  MAP was not staying above 60 while on the floor.  TR[facility] paged.  Rapid RN was already on floor to see another patient and was notified about this patient.  RR nurse saw patient at bedside. TR[facility] NP ordered albumin for patient.

## 2019-06-10 NOTE — Progress Notes (Signed)
CRITICAL VALUE ALERT  Critical Value:  2.6  Date & Time Notied:  06/10/2019 0235  Provider Notified: Silas [place], NP  Orders Received/Actions taken: n/a

## 2019-06-10 NOTE — Consult Note (Addendum)
Advanced Heart Failure Team Consult Note   Primary Physician: Kerin Perna, NP PCP-Cardiologist:  Lauree Chandler, MD  [facility]: Dr. Baldwin [place]   Reason for Consultation: acute on chronic systolic HF and hypotension   HPI:    Mark [name] is seen today for evaluation of acute on chronic systolic heart failure and hypotension at the request of Dr. Neysa Bonito, Internal Medicine.   Mark [name] a 43 y.o.malewith a history ofchronic systolic CHF (EF 16-60% with severe diffuse hypokinesis5/2020,NICM by cardiac cath 2017, history of right MCA CVA 2019, asthma, andpolysubstance use. Last echo from 5/20 showed that EF is markedly low at 10% with LV apical thrombus. The RV is severely dysfunctional as well. RHC in 8/20 with low output and high filling pressures, he was on milrinone during 8/20 admission but insisted on going home for a funeral.   He was readmitted 05/08/19 with recurrent cardiogenic shock. Co-ox was in the 30s. He was placed on milrinone and diuresed with IV lasix. Once diuresed he was transitioned to lasix 80 mg daily. Overall diuresed 9 pounds. Milrinone was turned off because he wanted to go home and get his affairs in order. He was referred to Palliative Care and understood he has limited time. He was not ready to transition to Hospice but was agreeable to outpatient Palliative Care. He was referred prior to discharge. He was continued on digoxin, spironolactone and losartan. He is on coumadin for LV thrombus.  His discharge wt on 9/21 was 135 lb.   He presented back to [facility] ED on 06/09/19 w/ complaints of productive cough, subjective fever and generalized body aches x 2 weeks. In the ED he was afebrile w/ elevated WBC ct at 13. CXR showed RML PNA. COVID negative. proBNP was 2,175 (3,017 previous admit). On admit, his weight was up 13 lb from previous d/c weight, up from 135>>148 lb. Also w/ AKI. SCr 1.30 on admit and further increased to 1.52 today (baseline  ~1.0).   He has been admitted by IM and started on antibiotics for PNA, IV Vanco and cefepime>>changed to Rocephin and azithromycin. WBC ct improving>>12.3 today. His home HF regimen was resumed, lasix 40 mg bid, losartan 12.5, spiro 25 and digoxin 0.125.  INR is therapeutic at 2.6. Hospital course c/b hypotension. Rapid response paged overnight for BP of 59/33. He was given 250 cc NS bolus. BP improved. Repeat BP soft 90/66. MAP during exam 69. Home HF meds were held this AM. Wt today is 144 lb. No supplemental O2 requirements at rest. He has exertional dyspnea. No palpitations, syncope/ near syncope. No arrthymias on tele. Prior to this admit, he was taking his HF meds but admits that he ran out of digoxin and spironolactone 2 days prior to admission.   Echo 12/2018 IMPRESSIONS  Moderate LV dilatation and severe systolic dysfunction with LVEF 5-10% and diffuse hypokinesis. 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 Grade 2 diastolic dysfunction with severely elevated filling pressures.  1. The right ventricle has severely reduced systolic function. The cavity was severely enlarged. There is no increase in right ventricular wall thickness.  2. Left atrial size was mildly dilated.  3. Right atrial size was moderately dilated.  4. Severely thickened tricuspid valve leaflets.  5. The mitral valve is grossly normal. Mitral valve regurgitation is moderate to severe by color flow Doppler. The MR jet is posteriorly-directed.  6. The tricuspid valve is grossly normal.  7. The pulmonic valve was  grossly normal. Pulmonic valve regurgitation is moderate is mild by color flow Doppler.  RHC 04/09/19  Findings:  RA = 16 RV = 51/8 PA =  49/28 (38) PCW = 30 Fick cardiac output/index = 2.6/1.5 Thermo CO/CI = 3.42/1.9 PVR = 3.1 WU FA sat = 99% PA sat = 44%, 45% PaPI = 1.3 RA/PCWP = 0.53  Assessment:  1. Severely decompensated HF with biventricular failure and elevated filling  pressures  Review of Systems: [y] = yes, [ ]  = no    General: Weight gain [ Y]; Weight loss [ ] ; Anorexia [ ] ; Fatigue [ Y]; Fever [ ] ; Chills [Y ]; Weakness [ Y]   Cardiac: Chest pain/pressure [ ] ; Resting SOB [ ] ; Exertional SOB [ Y]; Orthopnea [ Y]; Pedal Edema [ ] ; Palpitations [ ] ; Syncope [ ] ; Presyncope [ ] ; Paroxysmal nocturnal dyspnea[ ]    Pulmonary: Cough [ Y]; Wheezing[ ] ; Hemoptysis[ ] ; Sputum [ ] ; Snoring [ ]    GI: Vomiting[ ] ; Dysphagia[ ] ; Melena[ ] ; Hematochezia [ ] ; Heartburn[ ] ; Abdominal pain [ ] ; Constipation [ ] ; Diarrhea [ ] ; BRBPR [ ]    GU: Hematuria[ ] ; Dysuria [ ] ; Nocturia[ ]    Vascular: Pain in legs with walking [ ] ; Pain in feet with lying flat [ ] ; Non-healing sores [ ] ; Stroke [ ] ; TIA [ ] ; Slurred speech [ ] ;   Neuro: Headaches[ ] ; Vertigo[ ] ; Seizures[ ] ; Paresthesias[ ] ;Blurred vision [ ] ; Diplopia [ ] ; Vision changes [ ]    Ortho/Skin: Arthritis [ ] ; Joint pain [ ] ; Muscle pain [ ] ; Joint swelling [ ] ; Back Pain [ ] ; Rash [ ]    Psych: Depression[ ] ; Anxiety[ ]    Heme: Bleeding problems [ ] ; Clotting disorders [ ] ; Anemia [ ]    Endocrine: Diabetes [ ] ; Thyroid dysfunction[ ]   Home Medications Prior to Admission medications   Medication Sig Start Date End Date Taking? Authorizing Provider  aspirin 81 MG EC tablet Take 1 tablet (81 mg total) by mouth daily. Patient taking differently: Take 162 mg by mouth daily.  12/30/18  Yes Sheikh, Omair Latif, DO  budesonide-formoterol (SYMBICORT) 80-4.5 MCG/ACT inhaler Inhale 2 puffs into the lungs 2 (two) times daily. 04/13/19  Yes Creig Hines, NP  digoxin (LANOXIN) 0.125 MG tablet Take 1 tablet (0.125 mg total) by mouth daily. 12/30/18  Yes Sheikh, Omair Latif, DO  furosemide (LASIX) 80 MG tablet Take 1 tablet (80 mg total) by mouth every morning AND 0.5 tablets (40 mg total) every evening. Patient taking differently: Take 1 tablet (80 mg total) by mouth every morning AND 0.5 tablet (40 mg) in the  evening as needed for swelling 05/20/19  Yes Clegg, Amy D, NP  losartan (COZAAR) 25 MG tablet Take 0.5 tablets (12.5 mg total) by mouth daily. Patient taking differently: Take 25 mg by mouth at bedtime.  05/14/19  Yes Clegg, Amy D, NP  Menthol-Methyl Salicylate (MUSCLE RUB) 10-15 % CREA Apply 1 application topically daily as needed for muscle pain.   Yes [provider]  Multiple Vitamin (MULTIVITAMIN WITH MINERALS) TABS tablet Take 1 tablet by mouth daily.   Yes [provider]  OVER THE COUNTER MEDICATION Place 1 spray into both nostrils daily as needed (congestion). OTC  Nasal spray   Yes [provider]  potassium chloride SA (K-DUR) 20 MEQ tablet Take 1 tablet (20 mEq total) by mouth daily. Take with Metolazone. Patient taking differently: Take 20 mEq by mouth daily.  03/08/19  Yes Uchenna Seufert, Reuel Boom  R, MD  spironolactone (ALDACTONE) 25 MG tablet Take 1 tablet (25 mg total) by mouth daily. 05/14/19  Yes Clegg, Amy D, NP  warfarin (COUMADIN) 5 MG tablet 5 mg daily except 2.5 mg on Tuesday Patient taking differently: Take 2.5-5 mg by mouth See admin instructions. Take 1/2 tablet (2.5 mg) by mouth on Tuesday evening at 6pm, take 1 tablet (5 mg) on all other evenings of the week - at 6pm 05/13/19  Yes Clegg, Amy D, NP  albuterol (VENTOLIN HFA) 108 (90 Base) MCG/ACT inhaler Inhale 1-2 puffs into the lungs every 4 (four) hours as needed for wheezing or shortness of breath. 04/26/19   Hoy Register, MD    Past Medical History: Past Medical History:  Diagnosis Date   Asthma    Chronic systolic CHF (congestive heart failure) (HCC)    Cigarette smoker    CKD (chronic kidney disease), stage II    /notes 10/01/2017   COPD (chronic obstructive pulmonary disease) (HCC) 10/21/2017   on CT scan chest   History of echocardiogram    a. Echo 5/17 - EF 20-25%, severe diff HK, restrictive physiology, mild to mod MR, severe reduced RVSF, mod RVE, mild RAE, mod TR, PASP 48 mmHg    Hx of cardiac cath    a. LHC 5/17 - normal coronary arteries. PA 45/25, mean 33, PCWP mean 18   NICM (nonischemic cardiomyopathy) (HCC)    Stroke (HCC) 09/27/2017   "was weak on my left side; I'm fully recovered" (11/09/2017)   Substance abuse (HCC)    cocaine, marijuana    Past Surgical History: Past Surgical History:  Procedure Laterality Date   CARDIAC CATHETERIZATION N/A 01/05/2016   Procedure: Right/Left Heart Cath and Coronary Angiography;  Surgeon: Lennette Bihari, MD;  Location: [facility] INVASIVE CV LAB;  Service: Cardiovascular;  Laterality: N/A;   MULTIPLE EXTRACTIONS WITH ALVEOLOPLASTY Bilateral 04/11/2019   Procedure: Extraction of tooth #'s 2, 4, 5, 10, and 12 with alveoloplasty;  Surgeon: Charlynne Pander, DDS;  Location: [facility] OR;  Service: Oral Surgery;  Laterality: Bilateral;   RIGHT HEART CATH N/A 04/09/2019   Procedure: RIGHT HEART CATH;  Surgeon: Dolores Patty, MD;  Location: [facility] INVASIVE CV LAB;  Service: Cardiovascular;  Laterality: N/A;    Family History: Family History  Problem Relation Age of Onset   Cardiomyopathy Father        Reports his father has an LVAD   Heart failure Father    Hypertension Father    Heart disease Maternal Grandmother        had a whole in her heart   Deep vein thrombosis Neg Hx     Social History: Social History   Socioeconomic History   Marital status: Divorced    Spouse name: Not on file   Number of children: Not on file   Years of education: Not on file   Highest education level: Not on file  Occupational History   Not on file  Social Needs   Financial resource strain: Somewhat hard   Food insecurity    Worry: Sometimes true    Inability: Sometimes true   Transportation needs    Medical: No    Non-medical: No  Tobacco Use   Smoking status: Former Smoker    Packs/day: 1.00    Years: 30.00    Pack years: 30.00    Types: Cigarettes    Quit date: 09/27/2017    Years since quitting: 1.7    Smokeless tobacco: Never Used  Substance and Sexual Activity   Alcohol use: Yes    Alcohol/week: 5.0 standard drinks    Types: 5 Shots of liquor per week    Frequency: Never    Comment: 5-6 shots of vodka daily; "stopped it all after I had stroke 09/27/2017"   Drug use: Yes    Types: Cocaine, Marijuana    Comment: 11/09/2017 "none since 09/27/2017"   Sexual activity: Not Currently  Lifestyle   Physical activity    Days per week: 0 days    Minutes per session: 0 min   Stress: To some extent  Relationships   Social connections    Talks on phone: More than three times a week    Gets together: More than three times a week    Attends religious service: 1 to 4 times per year    Active member of club or organization: No    Attends meetings of clubs or organizations: Never    Relationship status: Divorced  Other Topics Concern   Not on file  Social History Narrative   Not on file    Allergies:  Allergies  Allergen Reactions   Clindamycin/Lincomycin Swelling and Palpitations   Hydrocodone Hives   Lisinopril Swelling and Other (See Comments)    Facial swelling/angioedema   Olopatadine Hcl Swelling    Makes his face swell up   Prednisone Shortness Of Breath, Nausea Only, Swelling and Other (See Comments)    Also made chest feel tight and genital area, legs, and face became swollen badly   Lasix [Furosemide] Hives and Swelling    Facial swelling Reaction to name brand LASIX   Penicillins Hives and Swelling     Has patient had a PCN reaction causing immediate rash, facial/tongue/throat swelling, SOB or lightheadedness with hypotension: Yes Has patient had a PCN reaction causing severe rash involving mucus membranes or skin necrosis: No Has patient had a PCN reaction that required hospitalization: No Has patient had a PCN reaction occurring within the last 10 years: No If all of the above answers are "NO", then may proceed with Cephalosporin use.    Bactrim  [Sulfamethoxazole-Trimethoprim] Swelling and Rash    Facial swelling    Objective:    Vital Signs:   Temp:  [97.6 F (36.4 C)-99.4 F (37.4 C)] 97.8 F (36.6 C) (10/19 0941) Pulse Rate:  [42-113] 99 (10/19 0944) Resp:  [17-40] 20 (10/19 1053) BP: (59-112)/(33-97) 76/54 (10/19 1053) SpO2:  [96 %-100 %] 100 % (10/19 0941) Weight:  [65.5 kg-67.2 kg] 65.5 kg (10/19 0010) Last BM Date: 06/09/19  Weight change: Filed Weights   06/09/19 1328 06/10/19 0010  Weight: 67.2 kg 65.5 kg    Intake/Output:   Intake/Output Summary (Last 24 hours) at 06/10/2019 1119 Last data filed at 06/10/2019 1116 Gross per 24 hour  Intake 1863.31 ml  Output 950 ml  Net 913.31 ml      Physical Exam    General:  Well appearing. No resp difficulty HEENT: normal Neck: supple. JVP . Carotids 2+ bilat; no bruits. No lymphadenopathy or thyromegaly appreciated. Cor: PMI nondisplaced. Regular rate & rhythm. No rubs, gallops or murmurs. Lungs: clear Abdomen: soft, nontender, nondistended. No hepatosplenomegaly. No bruits or masses. Good bowel sounds. Extremities: no cyanosis, clubbing, rash, edema Neuro: alert & orientedx3, cranial nerves grossly intact. moves all 4 extremities w/o difficulty. Affect pleasant   Telemetry   NSR, up 90s. No adverse arrhthymias   EKG    Sinus tachycardia Probable left atrial enlargement Left anterior fascicular block  Labs   Basic Metabolic Panel: Recent Labs  Lab 06/09/19 1140 06/10/19 0038  NA 129* 130*  K 5.1 4.1  CL 93* 94*  CO2 24 25  GLUCOSE 107* 127*  BUN 21* 24*  CREATININE 1.30* 1.52*  CALCIUM 8.8* 8.0*  MG 1.6*  --   PHOS 2.6  --     Liver Function Tests: Recent Labs  Lab 06/10/19 0038  AST 35  ALT 19  ALKPHOS 75  BILITOT 2.5*  PROT 6.1*  ALBUMIN 2.5*   No results for input(s): LIPASE, AMYLASE in the last 168 hours. No results for input(s): AMMONIA in the last 168 hours.  CBC: Recent Labs  Lab 06/09/19 1140 06/10/19 0038    WBC 13.1* 12.3*  NEUTROABS  --  9.8*  HGB 12.8* 11.2*  HCT 39.4 34.9*  MCV 80.2 81.2  PLT 196 176    Cardiac Enzymes: No results for input(s): CKTOTAL, CKMB, CKMBINDEX, TROPONINI in the last 168 hours.  BNP: BNP (last 3 results) Recent Labs    02/13/19 2243 05/08/19 2018 06/09/19 1140  BNP 3,902.9* 3,017.6* 2,175.3*    ProBNP (last 3 results) No results for input(s): PROBNP in the last 8760 hours.   CBG: No results for input(s): GLUCAP in the last 168 hours.  Coagulation Studies: Recent Labs    06/09/19 1406 06/10/19 0038  LABPROT 24.8* 27.3*  INR 2.3* 2.6*     Imaging   Dg Chest Portable 1 View  Result Date: 06/09/2019 CLINICAL DATA:  Dyspnea EXAM: PORTABLE CHEST 1 VIEW COMPARISON:  05/08/2019 FINDINGS: Patchy opacity in the medial right lower lung, overlying the right heart border, favored to be in the right middle lobe. Left lung is clear. No pleural effusion or pneumothorax. The heart is mildly enlarged. IMPRESSION: Right middle lobe pneumonia. Electronically Signed   By: Charline BillsSriyesh  Krishnan M.D.   On: 06/09/2019 13:15      Medications:     Current Medications:  aspirin EC  81 mg Oral Daily   digoxin  0.125 mg Oral Daily   furosemide  40 mg Oral BID   [START ON 06/11/2019] losartan  12.5 mg Oral Daily   mometasone-formoterol  2 puff Inhalation BID   [START ON 06/11/2019] spironolactone  25 mg Oral Daily   [START ON 06/11/2019] warfarin  2.5 mg Oral Q Tue-1800   warfarin  5 mg Oral Once per day on Sun Mon Wed Thu Fri Sat   Warfarin - Physician Dosing Inpatient   Does not apply q1800     Infusions:  azithromycin Stopped (06/09/19 1907)   cefTRIAXone (ROCEPHIN)  IV 1 g (06/09/19 2220)       Patient Profile   Mark [name] a 43 y.o.malewith a history ofchronic systolic CHF (EF 6-71%5-10% on most recent echo 12/2018 with severe diffuse hypokinesis5/2020),NICM by cardiac cath 2017, history of right MCA CVA 2019, asthma,  polysubstance use and multiple recent admissions for low output HF requiring milrinone (declines home milrinone), readmitted for PNA. Hospital course c/b hypotension/ low output HF.   Assessment/Plan    1. Rt Middle Lobe PNA: COVID negative. Step Pneumo +. Blood cultures negative. Abx per IM. Received IV Vanco and cefepime in ED>>changed to Rocephin and azithromycin. WBC ct improving down from 13.1>>12.3 today. Remains afebrile.   2. Chronic Biventricular Heart Failure w/ Low Output: History of nonischemic cardiomyopathy. Last echo  5/20 showed that EF is markedly low at 5-10% with LV apical thrombus. The RV is severely dysfunctional as well. RHC in  8/20 with low output and high filling pressures. He required inotropic support previous 2 admits (initial co-ox in the 30s>>improved to 63% w/ milrinone) but refuses home milrinone. Not a candidate for LVAD w/ RV failure. NYHA Functional Class III. Does not appear grossly volume overloaded today but wt up 9 lb from previous d/c weight (135>>144 lb). He ran out of spironolactone 2 days prior to admit. Hospital course c/b hypotension. Suspect 2/2 low output.  -HF meds on hold. Losartan, spironolactone and Lasix held this AM. MAPs in the 60s-70s. -limited options. We discussed home inotrope's but he continues to refuse. We will ask palliative care to see today to discuss hospice.    3. Apical Thrombus: Identified on ECHO 12/2018.  -on coumadin. INR is therapeutic at 2.6.   4. H/O MCA CVA 2019 :  - Continue atorvastatin 40 mg daily. - Continue Coumadin + aspirin 81 mg daily.  - INR therapeutic at 2.6   5. AKI: Scr 1.30 on admit and increased to 1.52 today (baseline ~1.0) - suspect 2/2 low output HF and hypotension  - previously required milrinone last 2 admits but refuses home ionotropes -HF regimen on hold due to hypotension.     Length of Stay: 1  Brittainy Simmons, PA-C  06/10/2019, 11:19 AM  Advanced Heart Failure Team Pager [phone]  (M-F; 7a - 4p)  Please contact [facility] for night-coverage after hours (4p -7a ) and weekends on [email or web address]   Patient seen and examined with the above-signed Advanced Practice Provider and/or Housestaff. I personally reviewed laboratory data, imaging studies and relevant notes. I independently examined the patient and formulated the important aspects of the plan. I have edited the note to reflect any of my changes or salient points. I have personally discussed the plan with the patient and/or family.  43 y/o male with 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 Has had several recent admits for cardiogenic shock supported with milrinone but has refused home inotropes. Now readmitted with RML PNA and recurrent low output HF and lactic acidosis. He is not candidate for advanced therapies due to severe RV dysfunction and substance abuse.   One exam minimal volume overload but prominent tachycardia and s3  Agree with treatment of PNA per primary team. We had long talk again about possibility of home inotropes but he has said that he has told his family that he doesn't have much time left and he is not interested in inotropic support. Just wants a way to be "comfortable and enjoy my last couple months or whatever I have left.' We have consulted Palliative Care to further clarify GOC and hopefully institute home hospice.   Mark Meresaniel Zenita Kister, MD  1:27 PM

## 2019-06-10 NOTE — Plan of Care (Signed)

## 2019-06-10 NOTE — Significant Event (Addendum)
Rapid Response Event Note  Overview: Hypotension in the setting of sepsis/low output HF  Initial Focused Assessment: While seeing another patient on the floor, staff alerted me to Mark [name] hypotension.  BP 59/33 per NIBP. Manual BP was 72/40. Pt is alert, oriented x4. Skin pink, warm and dry. Oral mucosa pink. Pt denies CP but does endorse some SOB. He denies dizzyness.  BBS clear anteriorly, fine bibasilar rales. Very minimal increased WOB with little to no accessory muscle use. He said last time in the hospital, Dr. Haroldine Laws ordered CPAP for him and it helped. Telemetry reviewed: SR with occasional PVCs. Discussed POC with Arby Barrette APP. Orders received.   0141- HR 92 SR, 83/66 (71) NIBP, RR 27 sats 100 on RA   Interventions: -250 cc NS bolus  Plan of Care (if not transferred): -May use Albumin if symptomatic hypotension persists -Contact HF team in am  -MAP goal greater than or equal to 60 -call primary svc and/or RRRN for assistance  Event Summary: Notified 0054 Arrived 0055 Call ended [phone]  Madelynn Done

## 2019-06-10 NOTE — Progress Notes (Signed)
Patient home meds in pharmacy

## 2019-06-10 NOTE — Progress Notes (Signed)
Pt refusing bed alarm .Explained to pt risks/benefits of bed alarm with low BP and increased risk of fall. Pt irate and states he doesn't care, that if he feels like he can't stand up he won't and he doesn't want someone in the room with him every time he has to use the bathroom. Pt states he understands the risks. Bed alarm off at this time.

## 2019-06-10 NOTE — Progress Notes (Signed)
Requested pt have BP cuff on with frequent vital signs scheduled automatically but pt refused. Will continue to do q2 hr v/s per NT.

## 2019-06-10 NOTE — Progress Notes (Signed)
Pt refused IV abx. States he can't tell if he's allergic and he isn't going to take any abx IV.

## 2019-06-10 NOTE — Progress Notes (Signed)
Patient stated has requested CPAP to help him breathe at night and did not receive it as requested. Later informed patient per MD, no current indications for CPAP.  Paged MD Neysa Bonito at [phone]. Hold or give oral Lasix 40 mg?Last BP 108/62(75)@ 0832.Has been hypotensive.Parameters for giving Lasix?  Spoke with Neysa Bonito via phone at [phone] and informed to hold AM dose to ensure patient remains hydrated. Informed [place] patient 98-100% on room air with intermittent periods of SOB.  Spoke with MD Neysa Bonito face to face and informed patient BP 76/54 at 1033. Albumin ordered, stated patient may be transferred off unit given hypotension and will consult cardio.   Paged MD Neysa Bonito at [phone]. Informed Albumin 12.5 g complete, BP 77/58 (65).Per prior convo, patient to be transferred to ICU? Spoke with Neysa Bonito via phone at [phone] and inquired about ICU xfer, Neysa Bonito stated will contact cardio to follow-up.  Patient requesting PRN nebulizer, called RT at 1238 to administer PRN albuterol nebulizer per patient's request.   Spoke with cardiologist Bensimhon after patient consult at bedside and inquired about plan of care face to face; stated will consult palliative, order Atrovent. Inquired if want to treat hypotension and reiterated to MD patient still a full code. Informed by MD will update chart with notes and info regarding plan of care.   Paged MD Neysa Bonito at [phone]. Informed Cardio has seen pt. (see note). palliative consulted. please clarify current treatment plan for RN,treat BP? Spoke with MD Neysa Bonito via phone at [phone] to clarify how to treat BP pending palliative, Neysa Bonito stated will order fluids and hold Lasix.   Paged MD Neysa Bonito at [phone], informed pt. has reservations about IV Azithro given prior ABX reaction.Inquired about dose,route & pre-med Benadryl. Neysa Bonito ordered PO doxycycline, patient informed. Patient requested Benadryl to pre-medicate before oral ABX to prevent reaction.   Spoke with Neysa Bonito face to face at 1705, informed patient  requesting Benadryl to pre-medicate prior to doxycycline, informed only 350 mL urine output, however, no changes to respiratory pattern and no distress. Neysa Bonito to bedside to speak with patient. PRN Benadryl ordered.   Approx 1730 spoke with Rapid RN Helle face to face and updated patient status and current plan of care. Will continue to monitor  During reassessment at 1745 patient reported feeling better than earlier in day and stated nebulizer improved breathing.   During [place] reassessment encouraged patient to express desires for goals of care with palliative team. Patient stated wanted to be made comfortable; encouraged to discuss what options are available to make patient comfortable. Patient stated not interested in IV med to prolong life.  Informed night shift RN during handoff patient not currently on fluid restriction, however, have been monitoring I's and O's closely.

## 2019-06-11 DIAGNOSIS — E871 Hypo-osmolality and hyponatremia: Secondary | ICD-10-CM

## 2019-06-11 DIAGNOSIS — Z7189 Other specified counseling: Secondary | ICD-10-CM

## 2019-06-11 DIAGNOSIS — Z515 Encounter for palliative care: Secondary | ICD-10-CM

## 2019-06-11 LAB — CBC
HCT: 33.2 % — ABNORMAL LOW (ref 39.0–52.0)
Hemoglobin: 11 g/dL — ABNORMAL LOW (ref 13.0–17.0)
MCH: 26.4 pg (ref 26.0–34.0)
MCHC: 33.1 g/dL (ref 30.0–36.0)
MCV: 79.6 fL — ABNORMAL LOW (ref 80.0–100.0)
Platelets: 206 10*3/uL (ref 150–400)
RBC: 4.17 MIL/uL — ABNORMAL LOW (ref 4.22–5.81)
RDW: 16.4 % — ABNORMAL HIGH (ref 11.5–15.5)
WBC: 6.2 10*3/uL (ref 4.0–10.5)
nRBC: 0 % (ref 0.0–0.2)

## 2019-06-11 LAB — BASIC METABOLIC PANEL
Anion gap: 10 (ref 5–15)
BUN: 22 mg/dL — ABNORMAL HIGH (ref 6–20)
CO2: 24 mmol/L (ref 22–32)
Calcium: 8.6 mg/dL — ABNORMAL LOW (ref 8.9–10.3)
Chloride: 96 mmol/L — ABNORMAL LOW (ref 98–111)
Creatinine, Ser: 1.09 mg/dL (ref 0.61–1.24)
GFR calc Af Amer: >60 mL/min (ref >60)
GFR calc non Af Amer: >60 mL/min (ref >60)
Glucose, Bld: 97 mg/dL (ref 70–99)
Potassium: 3.9 mmol/L (ref 3.5–5.1)
Sodium: 130 mmol/L — ABNORMAL LOW (ref 135–145)

## 2019-06-11 LAB — PROTIME-INR
INR: 3.1 — ABNORMAL HIGH (ref 0.8–1.2)
Prothrombin Time: 31.2 seconds — ABNORMAL HIGH (ref 11.4–15.2)

## 2019-06-11 MED ORDER — CEFDINIR 300 MG PO CAPS
300.0000 mg | ORAL_CAPSULE | Freq: Two times a day (BID) | ORAL | Status: AC
  Administered 2019-06-11 – 2019-06-13 (×6): 300 mg via ORAL
  Filled 2019-06-11 (×6): qty 1

## 2019-06-11 MED ORDER — FUROSEMIDE 40 MG PO TABS: 40.0000 mg | ORAL_TABLET | Freq: Two times a day (BID) | ORAL | Status: DC

## 2019-06-11 MED ORDER — FUROSEMIDE 10 MG/ML IJ SOLN
80.0000 mg | Freq: Once | INTRAMUSCULAR | Status: AC
  Administered 2019-06-11: 80 mg via INTRAVENOUS
  Filled 2019-06-11: qty 8

## 2019-06-11 MED ORDER — IPRATROPIUM BROMIDE 0.02 % IN SOLN
0.5000 mg | Freq: Three times a day (TID) | RESPIRATORY_TRACT | Status: DC
  Administered 2019-06-11 (×3): 0.5 mg via RESPIRATORY_TRACT
  Filled 2019-06-11 (×3): qty 2.5

## 2019-06-11 MED ORDER — CEFDINIR 300 MG PO CAPS: 300.0000 mg | ORAL_CAPSULE | Freq: Two times a day (BID) | ORAL | Status: DC

## 2019-06-11 MED ORDER — SPIRONOLACTONE 25 MG PO TABS
25.0000 mg | ORAL_TABLET | Freq: Every day | ORAL | Status: DC
  Administered 2019-06-11 – 2019-06-18 (×8): 25 mg via ORAL
  Filled 2019-06-11 (×8): qty 1

## 2019-06-11 NOTE — Progress Notes (Signed)
PROGRESS NOTE    Mark [name]   HYW:[id number] DOB: 08/24/1975 DOA: 06/09/2019  PCP: Kerin Perna, NP   Hospital Summary  Is a 43 year old male with past medical history of chronic systolic heart failure (EF 10 to 15% with left apical thrombus with severely diffuse hypokinesis 12/2018) NICM by cardiac cath in 2017, history of right MCA CVA 2019, recurrent cardiogenic shock, asthma, polysubstance abuse who presented to the ED on 10/18 with complaints of productive cough, fever and body aches x2 weeks and found to have WBC 13 with chest x-ray with right middle lobe pneumonia, Covid negative and on admission his weight was up 13 pounds from previous discharge.  He was started on Vanco/cefepime in ED and changed to Rocephin/azithromycin for CAP therapy and continued on remaining home meds.  Overnight 10/18-10/19 a rapid response for hypotension was called after patient received his home BP meds and patient received 250 cc bolus NS with slightly improved BP however again had drop in blood pressure.  He received albumin x2 for recurrent hypotension throughout the same day after his blood pressure meds were held.  He remained hypotensive but asymptomatic.  Cardiology was consulted who deemed patient not a candidate for advanced heart failure therapies due to RV dysfunction substance abuse and palliative care was consulted.  Patient was concerned about having an adverse reaction to azithromycin and was changed to doxycycline/Rocephin for CAP coverage  A & P   Principal Problem:   Right middle lobe pneumonia Active Problems:   Tobacco abuse   Asthma   Chronic systolic heart failure (HCC)   NICM (nonischemic cardiomyopathy) ([place])   History of right MCA stroke   Polysubstance abuse ([place])   Chronic hyponatremia   Strep pneumonia CAP in setting of right middle lobe pneumonia positive S pneumo urinary antigen, blood cultures with no growth to date, Covid negative.  Vancomycin/cefepime in the ED->  ceftriaxone/azithromycin-> ceftriaxone/doxycycline as he was refusing azithromycin, now refusing IV ceftriaxone for fear of having a reaction although he has not had a reaction to IV antibiotic.  Hypotension unlikely related to septic shock as patient is afebrile with resolved leukocytosis.  Briefly elevated lactic acid level at 2.6 which quickly resolved  will switch ceftriaxone to cefdinir 300 mg PO twice daily continue doxycycline  Chronic biventricular heart failure with low output history of nonischemic cardiomyopathy.  EF 5 to 10% in May 2020, RV severely dysfunctional.  Had RHC in August with low output and high filling pressure and required inotropic support, previously on milrinone while hospitalized but refused home milrinone.  Not a candidate for LVAD with right heart failure.  BNP elevated but at/below baseline.  Weight up but asymptomatic.  Ran out of spironolactone 2 days prior to admit.  Cardiology consulted  Continue to hold heart failure meds: Losartan, spironolactone  Lasix 80 mg IV today and ok to give spironolactone and digoxin per cardio today  Not interested in inotropic support  Follow-up with palliative care  Hypotension improved but still borderline pressures.  Likely combination between sepsis and end-stage heart failure but unlikely septic shock.    Ok to give spironolactone and digoxin today.   SBP goal >80  Continue to monitor with dose of IV lasix  DC IV fluids  Apical thrombus on Coumadin with INR 3.1.Monitor  AKI likely secondary to hemodynamic changes and heart failure. At baseline  DC IV fluids  Chronic hyponatremia secondary to chronic heart failure asymptomatic, stable. Monitor  Asthma well controlled Monitor  Polysubstance abuse  UDS negative, and history of cocaine in the past. Advised cessation  MCA CVA 2019 no residual weakness. Taking aspirin.  Monitor for GI bleed while on aspirin and Coumadin  Tobacco discussed cessation  DVT  prophylaxis: Coumadin, SCD   Code Status: Full Code  Diet: Heart healthy  Disposition Plan: Pending palliative consult  Consultants   Palliative care  Cardiology  Procedures   None  Antibiotics  Day 2/5-7 Received 1 day vancomycin/cefepime Received 1 day ceftriaxone/azithromycin Switched to ceftriaxone/doxycycline on 10/19      Subjective   Patient evaluated at bedside sitting upright. Stating that he is feeling better today and denies any cough or shortness of breath. States he was able to use CPAP overnight which helped his breathing and so he could sleep well. He states he is unable to lie flat due to shortness of breath and this is chronic. Otherwise denies any chest pain, shortness of breath, nausea, vomiting or other complaints. Overnight he refused IV antibiotics due to concern that he would have an adverse reaction although he has been tolerating his antibiotics.    Objective   Vitals:   06/11/19 0503 06/11/19 0735 06/11/19 0747 06/11/19 0832  BP: 96/82  101/82 95/72  Pulse:  100 71   Resp: (!) 22  18 (!) 25  Temp:   98 F (36.7 C) 98.1 F (36.7 C)  TempSrc:    Oral  SpO2:  99%  99%  Weight: 67.6 kg     Height:        Intake/Output Summary (Last 24 hours) at 06/11/2019 0956 Last data filed at 06/11/2019 0815 Gross per 24 hour  Intake 1505.67 ml  Output 700 ml  Net 805.67 ml   Filed Weights   06/09/19 1328 06/10/19 0010 06/11/19 0503  Weight: 67.2 kg 65.5 kg 67.6 kg    Examination:  Physical Exam Vitals signs and nursing note reviewed.  Constitutional:      Appearance: Normal appearance.  HENT:     Head: Normocephalic and atraumatic.     Nose: Nose normal.     Mouth/Throat:     Mouth: Mucous membranes are moist.  Eyes:     Extraocular Movements: Extraocular movements intact.  Neck:     Musculoskeletal: Normal range of motion.     Comments: +JVD Cardiovascular:     Rate and Rhythm: Normal rate and regular rhythm.  Pulmonary:      Effort: Pulmonary effort is normal.     Breath sounds: Normal breath sounds.     Comments: Slight conversational dyspnea today.  Abdominal:     General: Abdomen is flat.     Palpations: Abdomen is soft.  Musculoskeletal: Normal range of motion.  Neurological:     General: No focal deficit present.     Mental Status: He is alert. Mental status is at baseline.  Psychiatric:        Mood and Affect: Mood normal.        Behavior: Behavior normal.     Data Reviewed: I have personally reviewed following labs and imaging studies  CBC: Recent Labs  Lab 06/09/19 1140 06/10/19 0038 06/11/19 0254  WBC 13.1* 12.3* 6.2  NEUTROABS  --  9.8*  --   HGB 12.8* 11.2* 11.0*  HCT 39.4 34.9* 33.2*  MCV 80.2 81.2 79.6*  PLT 196 176 206   Basic Metabolic Panel: Recent Labs  Lab 06/09/19 1140 06/10/19 0038 06/11/19 0254  NA 129* 130* 130*  K 5.1 4.1 3.9  CL 93*  94* 96*  CO2 24 25 24   GLUCOSE 107* 127* 97  BUN 21* 24* 22*  CREATININE 1.30* 1.52* 1.09  CALCIUM 8.8* 8.0* 8.6*  MG 1.6*  --   --   PHOS 2.6  --   --    GFR: Estimated Creatinine Clearance: 81.8 mL/min (by C-G formula based on SCr of 1.09 mg/dL). Liver Function Tests: Recent Labs  Lab 06/10/19 0038  AST 35  ALT 19  ALKPHOS 75  BILITOT 2.5*  PROT 6.1*  ALBUMIN 2.5*   No results for input(s): LIPASE, AMYLASE in the last 168 hours. No results for input(s): AMMONIA in the last 168 hours. Coagulation Profile: Recent Labs  Lab 06/09/19 1406 06/10/19 0038 06/11/19 0254  INR 2.3* 2.6* 3.1*   Cardiac Enzymes: No results for input(s): CKTOTAL, CKMB, CKMBINDEX, TROPONINI in the last 168 hours. BNP (last 3 results) No results for input(s): PROBNP in the last 8760 hours. HbA1C: No results for input(s): HGBA1C in the last 72 hours. CBG: Recent Labs  Lab 06/10/19 1153  GLUCAP 148*   Lipid Profile: No results for input(s): CHOL, HDL, LDLCALC, TRIG, CHOLHDL, LDLDIRECT in the last 72 hours. Thyroid Function  Tests: No results for input(s): TSH, T4TOTAL, FREET4, T3FREE, THYROIDAB in the last 72 hours. Anemia Panel: No results for input(s): VITAMINB12, FOLATE, FERRITIN, TIBC, IRON, RETICCTPCT in the last 72 hours. Sepsis Labs: Recent Labs  Lab 06/09/19 1140 06/09/19 1540 06/10/19 0038 06/10/19 1110 06/10/19 1417  PROCALCITON 5.97  --   --   --   --   LATICACIDVEN  --  1.6 2.6* 1.4 1.4    Recent Results (from the past 240 hour(s))  SARS CORONAVIRUS 2 (TAT 6-24 HRS) Nasopharyngeal Nasopharyngeal Swab     Status: None   Collection Time: 06/09/19  1:30 PM   Specimen: Nasopharyngeal Swab  Result Value Ref Range Status   SARS Coronavirus 2 NEGATIVE NEGATIVE Final    Comment: (NOTE) SARS-CoV-2 target nucleic acids are NOT DETECTED. The SARS-CoV-2 RNA is generally detectable in upper and lower respiratory specimens during the acute phase of infection. Negative results do not preclude SARS-CoV-2 infection, do not rule out co-infections with other pathogens, and should not be used as the sole basis for treatment or other patient management decisions. Negative results must be combined with clinical observations, patient history, and epidemiological information. The expected result is Negative. Fact Sheet for Patients: HairSlick.nohttps://www.fda.gov/media/138098/download Fact Sheet for Healthcare Providers: quierodirigir.comhttps://www.fda.gov/media/138095/download This test is not yet approved or cleared by the [place] FDA and  has been authorized for detection and/or diagnosis of SARS-CoV-2 by FDA under an Emergency Use Authorization (EUA). This EUA will remain  in effect (meaning this test can be used) for the duration of the COVID-19 declaration under Section 56 4(b)(1) of the Act, 21 U.S.C. section 360bbb-3(b)(1), unless the authorization is terminated or revoked sooner. Performed at [facility], [address] [place], [place] [address]   Culture, blood (routine x 2)     Status: None  (Preliminary result)   Collection Time: 06/09/19  3:40 PM   Specimen: BLOOD LEFT FOREARM  Result Value Ref Range Status   Specimen Description BLOOD LEFT FOREARM  Final   Special Requests   Final    BOTTLES DRAWN AEROBIC AND ANAEROBIC Blood Culture adequate volume   Culture   Final    NO GROWTH 2 DAYS Performed at [facility], [address] [place], [place] [address]    Report Status PENDING  Incomplete  Culture, blood (routine x 2)     Status: None (Preliminary result)   Collection Time: 06/09/19  4:38 PM   Specimen: BLOOD RIGHT HAND  Result Value Ref Range Status   Specimen Description BLOOD RIGHT HAND  Final   Special Requests   Final    BOTTLES DRAWN AEROBIC AND ANAEROBIC Blood Culture results may not be optimal due to an inadequate volume of blood received in culture bottles   Culture   Final    NO GROWTH 2 DAYS Performed at [facility], [address] [place], [place] [address]    Report Status PENDING  Incomplete  Culture, sputum-assessment     Status: None   Collection Time: 06/10/19  6:55 PM   Specimen: Expectorated Sputum  Result Value Ref Range Status   Specimen Description EXPECTORATED SPUTUM  Final   Special Requests NONE  Final   Sputum evaluation   Final    THIS SPECIMEN IS ACCEPTABLE FOR SPUTUM CULTURE Performed at [facility], [address] [place], [place] [address]    Report Status 06/10/2019 FINAL  Final  MRSA PCR Screening     Status: None   Collection Time: 06/10/19  6:55 PM   Specimen: Nasal Mucosa; Nasopharyngeal  Result Value Ref Range Status   MRSA by PCR NEGATIVE NEGATIVE Final    Comment:        The GeneXpert MRSA Assay (FDA approved for NASAL specimens only), is one component of a comprehensive MRSA colonization surveillance program. It is not intended to diagnose MRSA infection nor to guide or monitor treatment for MRSA infections. Performed at [facility], [address] [place], [place] [address]    Culture, respiratory     Status: None (Preliminary result)   Collection Time: 06/10/19  6:55 PM  Result Value Ref Range Status   Specimen Description EXPECTORATED SPUTUM  Final   Special Requests NONE Reflexed from [id number]  Final   Gram Stain   Final    ABUNDANT WBC PRESENT,BOTH PMN AND MONONUCLEAR RARE GRAM VARIABLE ROD RARE GRAM POSITIVE COCCI Performed at [facility], [address] [place], [place] [address]    Culture PENDING  Incomplete   Report Status PENDING  Incomplete         Radiology Studies: Dg Chest Portable 1 View  Result Date: 06/09/2019 CLINICAL DATA:  Dyspnea EXAM: PORTABLE CHEST 1 VIEW COMPARISON:  05/08/2019 FINDINGS: Patchy opacity in the medial right lower lung, overlying the right heart border, favored to be in the right middle lobe. Left lung is clear. No pleural effusion or pneumothorax. The heart is mildly enlarged. IMPRESSION: Right middle lobe pneumonia. Electronically Signed   By: Charline BillsSriyesh  Krishnan M.D.   On: 06/09/2019 13:15        Scheduled Meds:  aspirin EC  81 mg Oral Daily   cefdinir  300 mg Oral Q12H   digoxin  0.125 mg Oral Daily   doxycycline  100 mg Oral Q12H   furosemide  80 mg Intravenous Once   ipratropium  0.5 mg Nebulization TID   mometasone-formoterol  2 puff Inhalation BID   spironolactone  25 mg Oral Daily   warfarin  2.5 mg Oral Q Tue-1800   warfarin  5 mg Oral Once per day on Sun Mon Wed Thu Fri Sat   Warfarin - Physician Dosing Inpatient   Does not apply q1800   Continuous Infusions:    LOS: 2 days    Time spent: 30  minutes    Jae Dire, DO [facility] [phone]  If 7PM-7AM, please contact night-coverage [email or web address] Password [email or web address] 06/11/2019, 9:56 AM

## 2019-06-11 NOTE — Plan of Care (Signed)

## 2019-06-11 NOTE — Consult Note (Signed)
Consultation Note Date: 06/11/2019   Patient Name: Mark [name]  DOB: 1976/07/26  MRN: [id number]  Age / Sex: 43 y.o., male  PCP: Kerin Perna, NP Referring Physician: Harold Hedge, MD  Reason for Consultation: Establishing goals of care  HPI/Patient Profile: 43 y.o. 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  Palliative consulted for goals of care.  I know Mr. Asencio from prior admission in May of this year.  Clinical Assessment and Goals of Care: I met today with Mr. Pickel.  His wife [name]) joined [place] via phone.  I reintroduced palliative care as specialized medical care for people living with serious illness. It focuses on providing relief from the symptoms and stress of a serious illness. The goal is to improve quality of life for both the patient and the family.  He recalls meeting with palliative care providers in the past, but states he does not remember whenever he and I spoke in the spring.  We discussed clinical course as well as wishes moving forward in regard to his care this hospitalization.  We discussed difference between a aggressive medical intervention path and a palliative, comfort focused care path.  Values and goals of care important to patient and family were attempted to be elicited.  He reports that the most important things to him are 1) his family and seeing his children as much as possible and 2) not suffering.   He reports that his father has LVAD that was implanted at [facility].  Reports that he was invested in plan for LVAD but was told that he is not a good candidate.  He reports  that he has been considering going to [facility] for a second opinion regarding this.   Questions and concerns addressed.   PMT will continue to support holistically.  SUMMARY OF RECOMMENDATIONS   -Mr. Herberger continues to slowly progress in his acceptance and understanding of the severity of his condition. -He reports having good understanding of the information that has been presented to him regarding his poor heart function, however, he states he does not understand exactly why he is not a candidate for consideration for VAD therapy.  He is very familiar with LVAD as his father has an LVAD that was implanted at [facility].  He reports that if he is in fact not a candidate for LVAD therapy, he is not interested in inotropes as he views this as, "not a real solution to anything."  He states that he may want to obtain a second opinion for his own peace of mind prior to considering transition from an aggressive care plan.  He does tell me that if he is, in fact, not a candidate for VAD, he would likely elect to transition home with hospice but he expressed  needing to be certain that no place would offer VAD therapy prior to making this decision. -Throughout my conversation with him today, he references what "could have been" had he had his Medicaid approved earlier.  His perception is that he was not given as many options for care based upon the fact that he had no payer source. -Discussed with Dr. Haroldine Laws who will again review Mr. Coster imaging regarding RV function to help give a better understanding of his overall potential to benefit from LVAD.  In my conversation with Mr. Sax today, he reports that he needs to note that he explored all options regarding possibility to extend the time and quality of his life for his children and wife. -Attempted to bring up subject of CODE STATUS again today, but he was not receptive as he reports he is, " not going there again today."  Code Status/Advance Care Planning: Full  code   Psycho-social/Spiritual:   Desire for further Chaplaincy support: Did not address today  Additional Recommendations: Education on Hospice  Prognosis:   Guarded  Discharge Planning: To Be Determined      Primary Diagnoses: Present on Admission: . Right middle lobe pneumonia . Chronic systolic heart failure ([place]) . Tobacco abuse . Asthma . Polysubstance abuse ([place]) . Chronic hyponatremia   I have reviewed the medical record, interviewed the patient and family, and examined the patient. The following aspects are pertinent.  Past Medical History:  Diagnosis Date  . Asthma   . Chronic systolic CHF (congestive heart failure) ([place])   . Cigarette smoker   . CKD (chronic kidney disease), stage II    Archie Endo 10/01/2017  . COPD (chronic obstructive pulmonary disease) ([place]) 10/21/2017   on CT scan chest  . History of echocardiogram    a. Echo 5/17 - EF 20-25%, severe diff HK, restrictive physiology, mild to mod MR, severe reduced RVSF, mod RVE, mild RAE, mod TR, PASP 48 mmHg  . Hx of cardiac cath    a. LHC 5/17 - normal coronary arteries. PA 45/25, mean 33, PCWP mean 18  . NICM (nonischemic cardiomyopathy) ([place])   . Stroke ([place]) 09/27/2017   "was weak on my left side; I'm fully recovered" (11/09/2017)  . Substance abuse ([place])    cocaine, marijuana   Social History   Socioeconomic History  . Marital status: Divorced    Spouse name: Not on file  . Number of children: Not on file  . Years of education: Not on file  . Highest education level: Not on file  Occupational History  . Not on file  Social Needs  . Financial resource strain: Somewhat hard  . Food insecurity    Worry: Sometimes true    Inability: Sometimes true  . Transportation needs    Medical: No    Non-medical: No  Tobacco Use  . Smoking status: Former Smoker    Packs/day: 1.00    Years: 30.00    Pack years: 30.00    Types: Cigarettes    Quit date: 09/27/2017    Years since quitting: 1.7  .  Smokeless tobacco: Never Used  Substance and Sexual Activity  . Alcohol use: Yes    Alcohol/week: 5.0 standard drinks    Types: 5 Shots of liquor per week    Frequency: Never    Comment: 5-6 shots of vodka daily; "stopped it all after I had stroke 09/27/2017"  . Drug use: Yes    Types: Cocaine, Marijuana    Comment: 11/09/2017 "none since 09/27/2017"  .  Sexual activity: Not Currently  Lifestyle  . Physical activity    Days per week: 0 days    Minutes per session: 0 min  . Stress: To some extent  Relationships  . Social connections    Talks on phone: More than three times a week    Gets together: More than three times a week    Attends religious service: 1 to 4 times per year    Active member of club or organization: No    Attends meetings of clubs or organizations: Never    Relationship status: Divorced  Other Topics Concern  . Not on file  Social History Narrative  . Not on file   Family History  Problem Relation Age of Onset  . Cardiomyopathy Father        Reports his father has an LVAD  . Heart failure Father   . Hypertension Father   . Heart disease Maternal Grandmother        had a whole in her heart  . Deep vein thrombosis Neg Hx    Scheduled Meds: . aspirin EC  81 mg Oral Daily  . cefdinir  300 mg Oral Q12H  . digoxin  0.125 mg Oral Daily  . doxycycline  100 mg Oral Q12H  . ipratropium  0.5 mg Nebulization TID  . mometasone-formoterol  2 puff Inhalation BID  . spironolactone  25 mg Oral Daily  . warfarin  2.5 mg Oral Q Tue-1800  . warfarin  5 mg Oral Once per day on Sun Mon Wed Thu Fri Sat  . Warfarin - Physician Dosing Inpatient   Does not apply q1800   Continuous Infusions: PRN Meds:.albuterol, diphenhydrAMINE Medications Prior to Admission:  Prior to Admission medications   Medication Sig Start Date End Date Taking? Authorizing Provider  aspirin 81 MG EC tablet Take 1 tablet (81 mg total) by mouth daily. Patient taking differently: Take 162 mg by mouth  daily.  12/30/18  Yes Sheikh, Omair Latif, DO  budesonide-formoterol (SYMBICORT) 80-4.5 MCG/ACT inhaler Inhale 2 puffs into the lungs 2 (two) times daily. 04/13/19  Yes Theora Gianotti, NP  digoxin (LANOXIN) 0.125 MG tablet Take 1 tablet (0.125 mg total) by mouth daily. 12/30/18  Yes Sheikh, Omair Latif, DO  furosemide (LASIX) 80 MG tablet Take 1 tablet (80 mg total) by mouth every morning AND 0.5 tablets (40 mg total) every evening. Patient taking differently: Take 1 tablet (80 mg total) by mouth every morning AND 0.5 tablet (40 mg) in the evening as needed for swelling 05/20/19  Yes Clegg, Amy D, NP  losartan (COZAAR) 25 MG tablet Take 0.5 tablets (12.5 mg total) by mouth daily. Patient taking differently: Take 25 mg by mouth at bedtime.  05/14/19  Yes Clegg, Amy D, NP  Menthol-Methyl Salicylate (MUSCLE RUB) 10-15 % CREA Apply 1 application topically daily as needed for muscle pain.   Yes [provider]  Multiple Vitamin (MULTIVITAMIN WITH MINERALS) TABS tablet Take 1 tablet by mouth daily.   Yes [provider]  OVER THE COUNTER MEDICATION Place 1 spray into both nostrils daily as needed (congestion). OTC  Nasal spray   Yes [provider]  potassium chloride SA (K-DUR) 20 MEQ tablet Take 1 tablet (20 mEq total) by mouth daily. Take 4mq with Metolazone. Patient taking differently: Take 20 mEq by mouth daily.  03/08/19  Yes Bensimhon, DShaune Pascal MD  spironolactone (ALDACTONE) 25 MG tablet Take 1 tablet (25 mg total) by mouth daily. 05/14/19  Yes  Clegg, Amy D, NP  warfarin (COUMADIN) 5 MG tablet 5 mg daily except 2.5 mg on Tuesday Patient taking differently: Take 2.5-5 mg by mouth See admin instructions. Take 1/2 tablet (2.5 mg) by mouth on Tuesday evening at 6pm, take 1 tablet (5 mg) on all other evenings of the week - at 6pm 05/13/19  Yes Clegg, Amy D, NP  albuterol (VENTOLIN HFA) 108 (90 Base) MCG/ACT inhaler Inhale 1-2 puffs into the lungs every 4 (four) hours as  needed for wheezing or shortness of breath. 04/26/19   Charlott Rakes, MD   Allergies  Allergen Reactions  . Clindamycin/Lincomycin Swelling and Palpitations  . Hydrocodone Hives  . Lisinopril Swelling and Other (See Comments)    Facial swelling/angioedema  . Olopatadine Hcl Swelling    Makes his face swell up  . Prednisone Shortness Of Breath, Nausea Only, Swelling and Other (See Comments)    Also made chest feel tight and genital area, legs, and face became swollen badly  . Lasix [Furosemide] Hives and Swelling    Facial swelling Reaction to name brand LASIX  . Penicillins Hives and Swelling     Has patient had a PCN reaction causing immediate rash, facial/tongue/throat swelling, SOB or lightheadedness with hypotension: Yes Has patient had a PCN reaction causing severe rash involving mucus membranes or skin necrosis: No Has patient had a PCN reaction that required hospitalization: No Has patient had a PCN reaction occurring within the last 10 years: No If all of the above answers are "NO", then may proceed with Cephalosporin use.   . Bactrim [Sulfamethoxazole-Trimethoprim] Swelling and Rash    Facial swelling   Review of Systems  Constitutional: Positive for activity change, fatigue and unexpected weight change.  Neurological: Positive for weakness.  Psychiatric/Behavioral: Positive for decreased concentration and sleep disturbance.    Physical Exam  General: Alert, awake, in no acute distress. Chronically ill appearing. Heart: Regular rate and rhythm.  Lungs: Good air movement, clear Abdomen: Soft, nontender, nondistended, positive bowel sounds.  Ext: No significant edema Skin: Warm and dry Neuro: Grossly intact, nonfocal.  Vital Signs: BP (!) 88/71   Pulse (!) 106   Temp 97.6 F (36.4 C) (Oral)   Resp (!) 30   Ht '5\' 9"'  (1.753 m)   Wt 67.6 kg Comment: scale b  SpO2 99%   BMI 22.00 kg/m  Pain Scale: 0-10   Pain Score: 0-No pain   SpO2: SpO2: 99 % O2  Device:SpO2: 99 % O2 Flow Rate: .   IO: Intake/output summary:   Intake/Output Summary (Last 24 hours) at 06/11/2019 1826 Last data filed at 06/11/2019 1736 Gross per 24 hour  Intake 1799.39 ml  Output 2075 ml  Net -275.61 ml    LBM: Last BM Date: 06/09/19 Baseline Weight: Weight: 67.2 kg Most recent weight: Weight: 67.6 kg(scale b)     Palliative Assessment/Data:   Flowsheet Rows     Most Recent Value  Intake Tab  Referral Department  Cardiology  Unit at Time of Referral  Cardiac/Telemetry Unit  Palliative Care Primary Diagnosis  Cardiac  Date Notified  06/10/19  Palliative Care Type  Return patient Palliative Care  Reason for referral  Clarify Goals of Care  Date of Admission  06/09/19  Date first seen by Palliative Care  06/11/19  # of days Palliative referral response time  1 Day(s)  # of days IP prior to Palliative referral  1  Clinical Assessment  Palliative Performance Scale Score  40%  Psychosocial & Spiritual  Assessment  Palliative Care Outcomes  Patient/Family meeting held?  Yes  Who was at the meeting?  Patient, wife via phone  Palliative Care Outcomes  Clarified goals of care      Time In: 1545 Time Out: 1715 Time Total: 90 Greater than 50%  of this time was spent counseling and coordinating care related to the above assessment and plan.  Signed by: Micheline Rough, MD   Please contact Palliative Medicine Team phone at [phone] for questions and concerns.  For individual provider: See Shea Evans

## 2019-06-11 NOTE — Progress Notes (Addendum)
Advanced Heart Failure Rounding Note  PCP-Cardiologist: Verne Carrow, MD   Subjective:     Feeling a bit better today. Was able to get better sleep last night. No resting dyspnea.   Diuretics held yesterday due to hypotension. Wt up 5 lb from 144>>149 lb. Denies cough. Refusing IV abx and freqeunt vital signs.    Objective:   Weight Range: 67.6 kg Body mass index is 22 kg/m.   Vital Signs:   Temp:  [97.8 F (36.6 C)-98.2 F (36.8 C)] 98.1 F (36.7 C) (10/20 0832) Pulse Rate:  [48-100] 71 (10/20 0747) Resp:  [13-32] 25 (10/20 0832) BP: (53-113)/(42-85) 95/72 (10/20 0832) SpO2:  [99 %-100 %] 99 % (10/20 0832) Weight:  [67.6 kg] 67.6 kg (10/20 0503) Last BM Date: 06/09/19  Weight change: Filed Weights   06/09/19 1328 06/10/19 0010 06/11/19 0503  Weight: 67.2 kg 65.5 kg 67.6 kg    Intake/Output:   Intake/Output Summary (Last 24 hours) at 06/11/2019 0846 Last data filed at 06/11/2019 0815 Gross per 24 hour  Intake 1655.67 ml  Output 700 ml  Net 955.67 ml      Physical Exam    General:  Fatigue appearing AAM. No resp difficulty HEENT: Normal Neck: Supple. Elevated JVD. Carotids 2+ bilat; no bruits. No lymphadenopathy or thyromegaly appreciated. Cor: PMI nondisplaced. Regular rate & rhythm. No rubs, gallops or murmurs. Lungs: Clear Abdomen: Soft, nontender, nondistended. No hepatosplenomegaly. No bruits or masses. Good bowel sounds. Extremities: No cyanosis, clubbing, rash, edema Neuro: Alert & orientedx3, cranial nerves grossly intact. moves all 4 extremities w/o difficulty. Affect pleasant   Telemetry   NSR 70s   EKG    N/A  Labs    CBC Recent Labs    06/10/19 0038 06/11/19 0254  WBC 12.3* 6.2  NEUTROABS 9.8*  --   HGB 11.2* 11.0*  HCT 34.9* 33.2*  MCV 81.2 79.6*  PLT 176 206   Basic Metabolic Panel Recent Labs    32/99/24 1140 06/10/19 0038 06/11/19 0254  NA 129* 130* 130*  K 5.1 4.1 3.9  CL 93* 94* 96*  CO2 24 25 24    GLUCOSE 107* 127* 97  BUN 21* 24* 22*  CREATININE 1.30* 1.52* 1.09  CALCIUM 8.8* 8.0* 8.6*  MG 1.6*  --   --   PHOS 2.6  --   --    Liver Function Tests Recent Labs    06/10/19 0038  AST 35  ALT 19  ALKPHOS 75  BILITOT 2.5*  PROT 6.1*  ALBUMIN 2.5*   No results for input(s): LIPASE, AMYLASE in the last 72 hours. Cardiac Enzymes No results for input(s): CKTOTAL, CKMB, CKMBINDEX, TROPONINI in the last 72 hours.  BNP: BNP (last 3 results) Recent Labs    02/13/19 2243 05/08/19 2018 06/09/19 1140  BNP 3,902.9* 3,017.6* 2,175.3*    ProBNP (last 3 results) No results for input(s): PROBNP in the last 8760 hours.   D-Dimer No results for input(s): DDIMER in the last 72 hours. Hemoglobin A1C No results for input(s): HGBA1C in the last 72 hours. Fasting Lipid Panel No results for input(s): CHOL, HDL, LDLCALC, TRIG, CHOLHDL, LDLDIRECT in the last 72 hours. Thyroid Function Tests No results for input(s): TSH, T4TOTAL, T3FREE, THYROIDAB in the last 72 hours.  Invalid input(s): FREET3  Other results:   Imaging     No results found.   Medications:     Scheduled Medications: . aspirin EC  81 mg Oral Daily  . cefdinir  300  mg Oral Q12H  . digoxin  0.125 mg Oral Daily  . doxycycline  100 mg Oral Q12H  . furosemide  80 mg Intravenous Once  . ipratropium  0.5 mg Nebulization TID  . mometasone-formoterol  2 puff Inhalation BID  . spironolactone  25 mg Oral Daily  . warfarin  2.5 mg Oral Q Tue-1800  . warfarin  5 mg Oral Once per day on Sun Mon Wed Thu Fri Sat  . Warfarin - Physician Dosing Inpatient   Does not apply q1800     Infusions:   PRN Medications:  albuterol, diphenhydrAMINE    Patient Profile   43 y/o male with 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 Has had several recent admits for cardiogenic shock supported with milrinone but has refused home inotropes. Now readmitted with RML PNA and recurrent low output HF and lactic acidosis. He is not  candidate for advanced therapies due to severe RV dysfunction and substance abuse.   Assessment/Plan   1. Rt Middle Lobe PNA: COVID negative. Step Pneumo +. Blood cultures negative. Received IV Vanco and cefepime in ED>>changed to cefdinir + doxycycline. WBC ct improving down from 13.1>>12.3>>6.2 today. Remains afebrile. Continue antibiotics per primary.  2. Chronic Biventricular Heart Failure w/ Low Output: History of nonischemic cardiomyopathy. Lastecho  5/20 showed that EF is markedly low at 5-10% with LV apical thrombus. The RV is severely dysfunctional as well. RHC in 8/20 with low output and high filling pressures. He required inotropic support previous 2 admits (initial co-ox in the 30s>>improved to 63% w/ milrinone) but refuses home milrinone. Not a candidate for LVAD w/ severe RV failure and substance abuse. NYHA Functional Class III.   -We discussed home inotropes but he continues to refuse. We have consulted palliative care to see today to discuss hospice.   -Wt is up after lasix hold. Exam w/ elevated JVD. Will give IV Lasix 80 mg x 1 today. -Resume home spiro and digoxin. Ok to given HF meds as long as SBP >80.  -Continue to hold losartan for now.   3. Apical Thrombus: Identified on ECHO 12/2018.  -on coumadin. INR is therapeutic at 3.1.   4. H/O MCA CVA 2019 :  - Continue atorvastatin 40 mg daily. - Continue Coumadin + aspirin 81 mg daily.  - INR therapeutic at 3.1   5. AKI: Scr 1.30 on admit and increased to 1.52 yesterday (baseline ~1.0) - suspect 2/2 low output HF and hypotension  - previously required milrinone last 2 admits but refuses home ionotropes -SCr improved to 1.09 today.  -Give IV Lasix 80 mg x 1 for volume overload. -f/u BMP in the AM   6. CODE STATUS: we discussed code status but he has not reached a decision. Will plan to discuss w/ family and palliative care. Remains full code for now.    Length of Stay: 2  Lyda Jester, PA-C  06/11/2019,  8:46 AM  Advanced Heart Failure Team Pager [phone] (M-F; 7a - 4p)  Please contact [facility] for night-coverage after hours (4p -7a ) and weekends on [email or web address]  Patient seen and examined with the above-signed Advanced Practice Provider and/or Housestaff. I personally reviewed laboratory data, imaging studies and relevant notes. I independently examined the patient and formulated the important aspects of the plan. I have edited the note to reflect any of my changes or salient points. I have personally discussed the plan with the patient and/or family.  Still tenuous but somewhat improved today. Volume status elevated. Will give  one dose IV lasix. Perfusion seems a bit better. Abx adjusted per primary team. I discussed Code Status with him today and he says he cannot make that decision because he has a a wife and kids and he lives for them. He wants his wife to decide what she would want. Await Palliative Care discussions.   Arvilla Meres, MD  9:02 AM

## 2019-06-11 NOTE — Progress Notes (Signed)
Confirmed with MD Bensimhon at 0840 face to face, ok to give meds (Lasix and Aldactone) if systolic above 80, notify MD if systolic less than 80.   Patient expressed desire for wife to make decision regarding code status/goals of care and frustration with process to determine decision maker with regards to goals of care. Patient appears depressed and frustrated.   Patient expressed frustrations with changes to medications and overall hospitalization and stated he did not want to take any new meds. Informed patient Lasix and Spirnolactone are medications that he takes regularly, however, they were held when he became BP and now medications are being resumed, but with close monitoring of BP. Patient agreed to take medications.   Notified by another RN patient bed alarm sounding and patient requesting alarm to be turned off, RN to bedside. Patient refused bed alarm at 1105 and stated has ability to call for someone if he needs help. RN informed patient of purpose of bed alarm to prevent fall in event that patient has low BP. Patient declined. Patient expressed frustration with being bothered and had removed pulse ox and BP cuff from arm. Patient stated did not want to wear pulse ox and BP cuff at this time. Notified tele at 1125 patient not wearing pulse ox.   Spoke with [facility] RN Nikki face to face and provided updates regarding current BP and new orders. Informed administered IV Lasix and will administer Spirnolactone after reassessing patient and BP. Will continue to monitor and notify if changes to BP.  1410 called service response to send up new tray for patient, prior tray incorrect, informed that new ticket was being submitted for meal. Patient had not received tray by 1435, called SR back at 1440 and requested new tray due to error, informed will send with rush.

## 2019-06-12 ENCOUNTER — Inpatient Hospital Stay: Payer: Self-pay

## 2019-06-12 DIAGNOSIS — J13 Pneumonia due to Streptococcus pneumoniae: Secondary | ICD-10-CM

## 2019-06-12 LAB — BASIC METABOLIC PANEL
Anion gap: 10 (ref 5–15)
BUN: 24 mg/dL — ABNORMAL HIGH (ref 6–20)
CO2: 24 mmol/L (ref 22–32)
Calcium: 8.8 mg/dL — ABNORMAL LOW (ref 8.9–10.3)
Chloride: 98 mmol/L (ref 98–111)
Creatinine, Ser: 1.21 mg/dL (ref 0.61–1.24)
GFR calc Af Amer: >60 mL/min (ref >60)
GFR calc non Af Amer: >60 mL/min (ref >60)
Glucose, Bld: 110 mg/dL — ABNORMAL HIGH (ref 70–99)
Potassium: 3.8 mmol/L (ref 3.5–5.1)
Sodium: 132 mmol/L — ABNORMAL LOW (ref 135–145)

## 2019-06-12 LAB — COOXEMETRY PANEL
Carboxyhemoglobin: 0.8 % (ref 0.5–1.5)
Methemoglobin: 0.7 % (ref 0.0–1.5)
O2 Saturation: 40.5 %
Total hemoglobin: 10.8 g/dL — ABNORMAL LOW (ref 12.0–16.0)

## 2019-06-12 LAB — CBC
HCT: 35.1 % — ABNORMAL LOW (ref 39.0–52.0)
Hemoglobin: 11.1 g/dL — ABNORMAL LOW (ref 13.0–17.0)
MCH: 25.4 pg — ABNORMAL LOW (ref 26.0–34.0)
MCHC: 31.6 g/dL (ref 30.0–36.0)
MCV: 80.3 fL (ref 80.0–100.0)
Platelets: 230 10*3/uL (ref 150–400)
RBC: 4.37 MIL/uL (ref 4.22–5.81)
RDW: 16.7 % — ABNORMAL HIGH (ref 11.5–15.5)
WBC: 5.8 10*3/uL (ref 4.0–10.5)
nRBC: 0.3 % — ABNORMAL HIGH (ref 0.0–0.2)

## 2019-06-12 LAB — PROTIME-INR
INR: 2.7 — ABNORMAL HIGH (ref 0.8–1.2)
Prothrombin Time: 28.3 seconds — ABNORMAL HIGH (ref 11.4–15.2)

## 2019-06-12 MED ORDER — ACETAMINOPHEN 325 MG PO TABS
650.0000 mg | ORAL_TABLET | Freq: Four times a day (QID) | ORAL | Status: DC | PRN
  Administered 2019-06-12: 650 mg via ORAL
  Filled 2019-06-12: qty 2

## 2019-06-12 MED ORDER — MILRINONE LACTATE IN DEXTROSE 20-5 MG/100ML-% IV SOLN
0.1250 ug/kg/min | INTRAVENOUS | Status: DC
  Administered 2019-06-12 (×2): 0.125 ug/kg/min via INTRAVENOUS
  Filled 2019-06-12: qty 100

## 2019-06-12 MED ORDER — IPRATROPIUM BROMIDE 0.02 % IN SOLN
0.5000 mg | Freq: Two times a day (BID) | RESPIRATORY_TRACT | Status: DC
  Administered 2019-06-12 – 2019-06-17 (×10): 0.5 mg via RESPIRATORY_TRACT
  Filled 2019-06-12 (×14): qty 2.5

## 2019-06-12 MED ORDER — SODIUM CHLORIDE 0.9% FLUSH: 10.0000 mL | INTRAVENOUS | Status: DC | PRN

## 2019-06-12 MED ORDER — CHLORHEXIDINE GLUCONATE CLOTH 2 % EX PADS
6.0000 | MEDICATED_PAD | Freq: Every day | CUTANEOUS | Status: DC
  Administered 2019-06-12: 6 via TOPICAL

## 2019-06-12 MED ORDER — FUROSEMIDE 10 MG/ML IJ SOLN
80.0000 mg | Freq: Two times a day (BID) | INTRAMUSCULAR | Status: DC
  Administered 2019-06-12: 19:00:00 80 mg via INTRAVENOUS
  Filled 2019-06-12: qty 8

## 2019-06-12 NOTE — Progress Notes (Signed)
PROGRESS NOTE    TACOMA DRUMM  EFE:[id number] DOB: [birth year] DOA: 06/09/2019 PCP: Grayce Sessions, NP     Brief Narrative:  Mark [name] is a 43 year old male with past medical history of chronic systolic heart failure (EF 10 to 15% with left apical thrombus with severely diffuse hypokinesis 12/2018) NICM by cardiac cath in 2017, history of right MCA CVA 2019, recurrent cardiogenic shock, asthma, polysubstance abuse who presented to the ED on 10/18 with complaints of productive cough, fever and body aches x2 weeks and found to have WBC 13 with chest x-ray with right middle lobe pneumonia, Covid negative and on admission his weight was up 13 pounds from previous discharge.  He was started on Vanco/cefepime in ED and changed to Rocephin/azithromycin for CAP therapy and continued on remaining home meds.  Overnight 10/18-10/19 a rapid response for hypotension was called after patient received his home BP meds and patient received 250 cc bolus NS with slightly improved BP however again had drop in blood pressure.  He received albumin x2 for recurrent hypotension throughout the same day after his blood pressure meds were held.  He remained hypotensive but asymptomatic.  Cardiology was consulted who deemed patient not a candidate for advanced heart failure therapies due to RV dysfunction substance abuse and palliative care was consulted.  Patient was concerned about having an adverse reaction to azithromycin and was changed to doxycycline/Rocephin for CAP coverage, then eventually deescalated to cefdinir, doxycycline.   New events last 24 hours / Subjective: Feeling well overall, has a cough on and off.  Denies any worsening shortness of breath or chest pain.  Assessment & Plan:   Principal Problem:   Right middle lobe pneumonia Active Problems:   Tobacco abuse   Asthma   Chronic systolic heart failure (HCC)   NICM (nonischemic cardiomyopathy) (HCC)   History of right MCA stroke  Polysubstance abuse (HCC)   Chronic hyponatremia    Strep pneumonia CAP in setting of right middle lobe pneumonia  -Positive S pneumo urinary antigen, blood cultures with no growth to date, Covid negative.  Vancomycin/cefepime in the ED -> ceftriaxone/azithromycin -> ceftriaxone/doxycycline as he was refusing azithromycin, now refusing IV ceftriaxone for fear of having a reaction although he has not had a reaction to IV antibiotic.  Now on cefdinir + doxycycline  Chronic biventricular heart failure with low output  -History of nonischemic cardiomyopathy.  EF 5 to 10% in May 2020, RV severely dysfunctional.  Had RHC in August with low output and high filling pressure and required inotropic support, previously on milrinone while hospitalized but refused home milrinone.  Not a candidate for LVAD with right heart failure.  BNP elevated but at/below baseline.  Weight up but asymptomatic.  Ran out of spironolactone 2 days prior to admit.  Cardiology consulted -Continue to hold Losartan -Continue digoxin, spironolactone  Hypotension  -Improved  Apical thrombus  -Continue Coumadin   AKI  -Resolved  Chronic hyponatremia secondary to chronic heart failure  -Asymptomatic, stable. Monitor  Asthma  -Well controlled. Monitor  Polysubstance abuse  -UDS negative, and history of cocaine in the past  MCA CVA 2019  -No residual weakness.   Continue Coumadin, aspirin  Tobacco  -Cessation counseling     DVT prophylaxis: Coumadin Code Status: Full Family Communication: None at bedside Disposition Plan: Pending further heart failure team recommendations   Consultants:   Advanced heart failure  Palliative care   Procedures:   None   Antimicrobials:  Anti-infectives (From admission, onward)  Start     Dose/Rate Route Frequency Ordered Stop   06/11/19 1000  cefdinir (OMNICEF) capsule 300 mg  Status:  Discontinued    Note to Pharmacy: Has been tolerating cephalosporins    300 mg Oral Every 12 hours 06/11/19 0804 06/11/19 0804   06/11/19 1000  cefdinir (OMNICEF) capsule 300 mg    Note to Pharmacy: Has been tolerating cephalosporins   300 mg Oral Every 12 hours 06/11/19 0804 06/15/19 0959   06/10/19 2200  doxycycline (VIBRA-TABS) tablet 100 mg     100 mg Oral Every 12 hours 06/10/19 1607 06/14/19 2159   06/10/19 0500  vancomycin (VANCOCIN) IVPB 750 mg/150 ml premix  Status:  Discontinued     750 mg 150 mL/hr over 60 Minutes Intravenous Every 12 hours 06/09/19 1402 06/09/19 1629   06/09/19 2300  ceFEPIme (MAXIPIME) 2 g in sodium chloride 0.9 % 100 mL IVPB  Status:  Discontinued     2 g 200 mL/hr over 30 Minutes Intravenous Every 8 hours 06/09/19 1400 06/09/19 1629   06/09/19 2300  cefTRIAXone (ROCEPHIN) 1 g in sodium chloride 0.9 % 100 mL IVPB  Status:  Discontinued     1 g 200 mL/hr over 30 Minutes Intravenous Every 24 hours 06/09/19 1628 06/11/19 0804   06/09/19 1630  azithromycin (ZITHROMAX) 500 mg in sodium chloride 0.9 % 250 mL IVPB  Status:  Discontinued     500 mg 250 mL/hr over 60 Minutes Intravenous Every 24 hours 06/09/19 1628 06/10/19 1607   06/09/19 1400  ceFEPIme (MAXIPIME) 2 g in sodium chloride 0.9 % 100 mL IVPB     2 g 200 mL/hr over 30 Minutes Intravenous  Once 06/09/19 1359 06/09/19 1521   06/09/19 1400  vancomycin (VANCOCIN) 1,250 mg in sodium chloride 0.9 % 250 mL IVPB  Status:  Discontinued     1,250 mg 166.7 mL/hr over 90 Minutes Intravenous  Once 06/09/19 1359 06/09/19 1700        Objective: Vitals:   06/12/19 0436 06/12/19 0804 06/12/19 0921 06/12/19 0945  BP: 90/72 (!) 105/91  105/85  Pulse: 84 81  (!) 102  Resp: 18  (!) 21 20  Temp: 97.6 F (36.4 C) 97.8 F (36.6 C)  (!) 97.4 F (36.3 C)  TempSrc: Oral   Oral  SpO2: 100% 100% 99% 100%  Weight: 67.5 kg     Height:        Intake/Output Summary (Last 24 hours) at 06/12/2019 1135 Last data filed at 06/12/2019 0949 Gross per 24 hour  Intake 1460 ml  Output 1850 ml   Net -390 ml   Filed Weights   06/10/19 0010 06/11/19 0503 06/12/19 0436  Weight: 65.5 kg 67.6 kg 67.5 kg    Examination:  General exam: Appears calm and comfortable  Respiratory system: Clear to auscultation. Respiratory effort normal. No respiratory distress. No conversational dyspnea.  Cardiovascular system: S1 & S2 heard, tachycardic, regular rhythm. No murmurs. No pedal edema. Gastrointestinal system: Abdomen is nondistended, soft and nontender. Normal bowel sounds heard. Central nervous system: Alert and oriented. No focal neurological deficits. Speech clear.  Extremities: Symmetric in appearance  Skin: No rashes, lesions or ulcers on exposed skin  Psychiatry: Judgement and insight appear normal. Mood & affect appropriate.   Data Reviewed: I have personally reviewed following labs and imaging studies  CBC: Recent Labs  Lab 06/09/19 1140 06/10/19 0038 06/11/19 0254 06/12/19 0448  WBC 13.1* 12.3* 6.2 5.8  NEUTROABS  --  9.8*  --   --  HGB 12.8* 11.2* 11.0* 11.1*  HCT 39.4 34.9* 33.2* 35.1*  MCV 80.2 81.2 79.6* 80.3  PLT 196 176 206 230   Basic Metabolic Panel: Recent Labs  Lab 06/09/19 1140 06/10/19 0038 06/11/19 0254 06/12/19 0448  NA 129* 130* 130* 132*  K 5.1 4.1 3.9 3.8  CL 93* 94* 96* 98  CO2 24 25 24 24   GLUCOSE 107* 127* 97 110*  BUN 21* 24* 22* 24*  CREATININE 1.30* 1.52* 1.09 1.21  CALCIUM 8.8* 8.0* 8.6* 8.8*  MG 1.6*  --   --   --   PHOS 2.6  --   --   --    GFR: Estimated Creatinine Clearance: 73.6 mL/min (by C-G formula based on SCr of 1.21 mg/dL). Liver Function Tests: Recent Labs  Lab 06/10/19 0038  AST 35  ALT 19  ALKPHOS 75  BILITOT 2.5*  PROT 6.1*  ALBUMIN 2.5*   No results for input(s): LIPASE, AMYLASE in the last 168 hours. No results for input(s): AMMONIA in the last 168 hours. Coagulation Profile: Recent Labs  Lab 06/09/19 1406 06/10/19 0038 06/11/19 0254 06/12/19 0448  INR 2.3* 2.6* 3.1* 2.7*   Cardiac Enzymes: No  results for input(s): CKTOTAL, CKMB, CKMBINDEX, TROPONINI in the last 168 hours. BNP (last 3 results) No results for input(s): PROBNP in the last 8760 hours. HbA1C: No results for input(s): HGBA1C in the last 72 hours. CBG: Recent Labs  Lab 06/10/19 1153  GLUCAP 148*   Lipid Profile: No results for input(s): CHOL, HDL, LDLCALC, TRIG, CHOLHDL, LDLDIRECT in the last 72 hours. Thyroid Function Tests: No results for input(s): TSH, T4TOTAL, FREET4, T3FREE, THYROIDAB in the last 72 hours. Anemia Panel: No results for input(s): VITAMINB12, FOLATE, FERRITIN, TIBC, IRON, RETICCTPCT in the last 72 hours. Sepsis Labs: Recent Labs  Lab 06/09/19 1140 06/09/19 1540 06/10/19 0038 06/10/19 1110 06/10/19 1417  PROCALCITON 5.97  --   --   --   --   LATICACIDVEN  --  1.6 2.6* 1.4 1.4    Recent Results (from the past 240 hour(s))  SARS CORONAVIRUS 2 (TAT 6-24 HRS) Nasopharyngeal Nasopharyngeal Swab     Status: None   Collection Time: 06/09/19  1:30 PM   Specimen: Nasopharyngeal Swab  Result Value Ref Range Status   SARS Coronavirus 2 NEGATIVE NEGATIVE Final    Comment: (NOTE) SARS-CoV-2 target nucleic acids are NOT DETECTED. The SARS-CoV-2 RNA is generally detectable in upper and lower respiratory specimens during the acute phase of infection. Negative results do not preclude SARS-CoV-2 infection, do not rule out co-infections with other pathogens, and should not be used as the sole basis for treatment or other patient management decisions. Negative results must be combined with clinical observations, patient history, and epidemiological information. The expected result is Negative. Fact Sheet for Patients: HairSlick.nohttps://www.fda.gov/media/138098/download Fact Sheet for Healthcare Providers: quierodirigir.comhttps://www.fda.gov/media/138095/download This test is not yet approved or cleared by the [place] FDA and  has been authorized for detection and/or diagnosis of SARS-CoV-2 by FDA under an Emergency  Use Authorization (EUA). This EUA will remain  in effect (meaning this test can be used) for the duration of the COVID-19 declaration under Section 56 4(b)(1) of the Act, 21 U.S.C. section 360bbb-3(b)(1), unless the authorization is terminated or revoked sooner. Performed at [facility], [address] [place], [place] [address]   Culture, blood (routine x 2)     Status: None (Preliminary result)   Collection Time: 06/09/19  3:40 PM   Specimen: BLOOD  LEFT FOREARM  Result Value Ref Range Status   Specimen Description BLOOD LEFT FOREARM  Final   Special Requests   Final    BOTTLES DRAWN AEROBIC AND ANAEROBIC Blood Culture adequate volume   Culture   Final    NO GROWTH 3 DAYS Performed at [facility], [address] [place], [place] [address]    Report Status PENDING  Incomplete  Culture, blood (routine x 2)     Status: None (Preliminary result)   Collection Time: 06/09/19  4:38 PM   Specimen: BLOOD RIGHT HAND  Result Value Ref Range Status   Specimen Description BLOOD RIGHT HAND  Final   Special Requests   Final    BOTTLES DRAWN AEROBIC AND ANAEROBIC Blood Culture results may not be optimal due to an inadequate volume of blood received in culture bottles   Culture   Final    NO GROWTH 3 DAYS Performed at [facility], [address] [place], [place] [address]    Report Status PENDING  Incomplete  Culture, sputum-assessment     Status: None   Collection Time: 06/10/19  6:55 PM   Specimen: Expectorated Sputum  Result Value Ref Range Status   Specimen Description EXPECTORATED SPUTUM  Final   Special Requests NONE  Final   Sputum evaluation   Final    THIS SPECIMEN IS ACCEPTABLE FOR SPUTUM CULTURE Performed at [facility], [address] [place], [place] [address]    Report Status 06/10/2019 FINAL  Final  MRSA PCR Screening     Status: None   Collection Time: 06/10/19  6:55 PM   Specimen: Nasal Mucosa; Nasopharyngeal  Result Value Ref Range  Status   MRSA by PCR NEGATIVE NEGATIVE Final    Comment:        The GeneXpert MRSA Assay (FDA approved for NASAL specimens only), is one component of a comprehensive MRSA colonization surveillance program. It is not intended to diagnose MRSA infection nor to guide or monitor treatment for MRSA infections. Performed at [facility], [address] [place], [place] [address]   Culture, respiratory     Status: None (Preliminary result)   Collection Time: 06/10/19  6:55 PM  Result Value Ref Range Status   Specimen Description EXPECTORATED SPUTUM  Final   Special Requests NONE Reflexed from [id number]  Final   Gram Stain   Final    ABUNDANT WBC PRESENT,BOTH PMN AND MONONUCLEAR RARE GRAM VARIABLE ROD RARE GRAM POSITIVE COCCI    Culture   Final    CULTURE REINCUBATED FOR BETTER GROWTH Performed at [facility], [address] [place], [place] [address]    Report Status PENDING  Incomplete      Radiology Studies: No results found.    Scheduled Meds: . aspirin EC  81 mg Oral Daily  . cefdinir  300 mg Oral Q12H  . digoxin  0.125 mg Oral Daily  . doxycycline  100 mg Oral Q12H  . ipratropium  0.5 mg Nebulization BID  . mometasone-formoterol  2 puff Inhalation BID  . spironolactone  25 mg Oral Daily  . warfarin  2.5 mg Oral Q Tue-1800  . warfarin  5 mg Oral Once per day on Sun Mon Wed Thu Fri Sat  . Warfarin - Physician Dosing Inpatient   Does not apply q1800   Continuous Infusions:   LOS: 3 days      Time spent: 40 minutes   Noralee StainJennifer Matilyn Fehrman, DO [facility] 06/12/2019, 11:35 AM   Available via Epic secure chat 7am-7pm After these hours, please refer to coverage provider listed on [email or web address]

## 2019-06-12 NOTE — Progress Notes (Signed)
Advanced Heart Failure Rounding Note  PCP-Cardiologist: Lauree Chandler, MD   Subjective:    Feels better today. Denies  orthopnea or PND. Still SOB with minimal exertion.   He had long talk with Palliative team and now states he is interested in LVAD    Objective:   Weight Range: 67.5 kg Body mass index is 21.97 kg/m.   Vital Signs:   Temp:  [97.4 F (36.3 C)-98.5 F (36.9 C)] 97.9 F (36.6 C) (10/21 1151) Pulse Rate:  [81-107] 86 (10/21 1151) Resp:  [18-28] 20 (10/21 0945) BP: (90-106)/(72-91) 102/85 (10/21 1151) SpO2:  [98 %-100 %] 100 % (10/21 1151) Weight:  [67.5 kg] 67.5 kg (10/21 0436) Last BM Date: 06/09/19  Weight change: Filed Weights   06/10/19 0010 06/11/19 0503 06/12/19 0436  Weight: 65.5 kg 67.6 kg 67.5 kg    Intake/Output:   Intake/Output Summary (Last 24 hours) at 06/12/2019 1827 Last data filed at 06/12/2019 1512 Gross per 24 hour  Intake 1420 ml  Output 800 ml  Net 620 ml      Physical Exam    General:  Weak appearing. No resp difficulty HEENT: normal Neck: supple. JVP to jaw Prominent CV waves . Carotids 2+ bilat; no bruits. No lymphadenopathy or thryomegaly appreciated. Cor: PMI nondisplaced. Regular rate & rhythm. + s3 Lungs: clear Abdomen: soft, nontender, nondistended. No hepatosplenomegaly. No bruits or masses. Good bowel sounds. Extremities: no cyanosis, clubbing, rash, edema Neuro: alert & orientedx3, cranial nerves grossly intact. moves all 4 extremities w/o difficulty. Affect pleasant  Telemetry   NSR 80s Personally reviewed   EKG    N/A  Labs    CBC Recent Labs    06/10/19 0038 06/11/19 0254 06/12/19 0448  WBC 12.3* 6.2 5.8  NEUTROABS 9.8*  --   --   HGB 11.2* 11.0* 11.1*  HCT 34.9* 33.2* 35.1*  MCV 81.2 79.6* 80.3  PLT 176 206 616   Basic Metabolic Panel Recent Labs    06/11/19 0254 06/12/19 0448  NA 130* 132*  K 3.9 3.8  CL 96* 98  CO2 24 24  GLUCOSE 97 110*  BUN 22* 24*  CREATININE  1.09 1.21  CALCIUM 8.6* 8.8*   Liver Function Tests Recent Labs    06/10/19 0038  AST 35  ALT 19  ALKPHOS 75  BILITOT 2.5*  PROT 6.1*  ALBUMIN 2.5*   No results for input(s): LIPASE, AMYLASE in the last 72 hours. Cardiac Enzymes No results for input(s): CKTOTAL, CKMB, CKMBINDEX, TROPONINI in the last 72 hours.  BNP: BNP (last 3 results) Recent Labs    02/13/19 2243 05/08/19 2018 06/09/19 1140  BNP 3,902.9* 3,017.6* 2,175.3*    ProBNP (last 3 results) No results for input(s): PROBNP in the last 8760 hours.   D-Dimer No results for input(s): DDIMER in the last 72 hours. Hemoglobin A1C No results for input(s): HGBA1C in the last 72 hours. Fasting Lipid Panel No results for input(s): CHOL, HDL, LDLCALC, TRIG, CHOLHDL, LDLDIRECT in the last 72 hours. Thyroid Function Tests No results for input(s): TSH, T4TOTAL, T3FREE, THYROIDAB in the last 72 hours.  Invalid input(s): FREET3  Other results:   Imaging    No results found.   Medications:     Scheduled Medications: . aspirin EC  81 mg Oral Daily  . cefdinir  300 mg Oral Q12H  . digoxin  0.125 mg Oral Daily  . doxycycline  100 mg Oral Q12H  . furosemide  80 mg Intravenous BID  .  ipratropium  0.5 mg Nebulization BID  . mometasone-formoterol  2 puff Inhalation BID  . spironolactone  25 mg Oral Daily  . warfarin  2.5 mg Oral Q Tue-1800  . warfarin  5 mg Oral Once per day on Sun Mon Wed Thu Fri Sat  . Warfarin - Physician Dosing Inpatient   Does not apply q1800    Infusions: . milrinone      PRN Medications: acetaminophen, albuterol, diphenhydrAMINE    Patient Profile   43 y/o male with 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 Has had several recent admits for cardiogenic shock supported with milrinone but has refused home inotropes. Now readmitted with RML PNA and recurrent low output HF and lactic acidosis. He is not candidate for advanced therapies due to severe RV dysfunction and substance abuse.    Assessment/Plan   1. Rt Middle Lobe PNA: COVID negative. Step Pneumo +. Blood cultures negative. Received IV Vanco and cefepime in ED>>changed to cefdinir + doxycycline.  - breathing improved - WBC falling - Afebrile - Continue abx  2. Acute on Chronic Biventricular Heart Failure w/ Low Output: History of nonischemic cardiomyopathy. Lastecho  5/20 showed that EF is markedly low at 5-10% with LV apical thrombus. The RV is severely dysfunctional as well. RHC in 8/20 with low output and high filling pressures. He required inotropic support previous 2 admits (initial co-ox in the 30s>>improved to 63% w/ milrinone) but refuses home milrinone. Not a candidate for LVAD w/ severe RV failure and substance abuse. NYHA Functional Class III.   -We discussed home inotropes but he continues to refuse. We have consulted palliative care to see today to discuss hospice.   -He has spoken to the Hospice team and has told them he is now interested in LVAD. -I have reviewed his recent echo and RHC and also talked with VAD coordinators who have met him before. 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 He has also previously refused VAD saying he would continue to do anything he wants to do and not be able to comply with what was needed to take care of a VAD. - I revisited all of this with him today and he says he now wants to proceed with VAD w/u. I told him that RV dysfunction amy be prohibitive but we will place a PICC and get him on milrinone and then repeat RHC and echo and see if RV is recruitable.  - Resume IV lasix - Continue digoxin and spiro   3. Apical Thrombus: Identified on ECHO 12/2018.  -on coumadin. INR is therapeutic at 2.7. No bleeding. Discussed dosing with PharmD personally.   4. H/O MCA CVA 2019 :  - Continue atorvastatin 40 mg daily. - Continue Coumadin + aspirin 81 mg daily.  - INR therapeutic at 2.7  5. AKI: Scr 1.30 on admit and increased to 1.52 yesterday (baseline ~1.0) -  suspect 2/2 low output HF and hypotension  - previously required milrinone last 2 admits but refuses home ionotropes -SCr improved to 1.21 today.   6. CODE STATUS: Full code for now. See discussion above  Total time spent 45 minutes. Over half that time spent discussing above. Appreciate Dr. Kirstie Mirza time and input.    Length of Stay: 3  Glori Bickers, MD  06/12/2019, 6:27 PM  Advanced Heart Failure Team Pager [phone] (M-F; 7a - 4p)  Please contact [facility] for night-coverage after hours (4p -7a ) and weekends on [email or web address]

## 2019-06-12 NOTE — Progress Notes (Signed)
Started milrinone drip & gave IV push lasix. Put in IV consult. for PICC line for CVP monitoring

## 2019-06-12 NOTE — Plan of Care (Signed)
  Problem: Education: Goal: Knowledge of General Education information will improve Description: Including pain rating scale, medication(s)/side effects and non-pharmacologic comfort measures Outcome: Progressing   Problem: Health Behavior/Discharge Planning: Goal: Ability to manage health-related needs will improve Outcome: Progressing   Problem: Clinical Measurements: Goal: Ability to maintain clinical measurements within normal limits will improve Outcome: Progressing Goal: Will remain free from infection Outcome: Progressing Goal: Diagnostic test results will improve Outcome: Progressing Goal: Respiratory complications will improve Outcome: Progressing Goal: Cardiovascular complication will be avoided Outcome: Progressing   Problem: Activity: Goal: Risk for activity intolerance will decrease Outcome: Progressing   Problem: Nutrition: Goal: Adequate nutrition will be maintained Outcome: Progressing   Problem: Coping: Goal: Level of anxiety will decrease Outcome: Progressing   Problem: Elimination: Goal: Will not experience complications related to bowel motility Outcome: Progressing Goal: Will not experience complications related to urinary retention Outcome: Progressing   Problem: Pain Managment: Goal: General experience of comfort will improve Outcome: Progressing   Problem: Safety: Goal: Ability to remain free from injury will improve Outcome: Progressing   Problem: Skin Integrity: Goal: Risk for impaired skin integrity will decrease Outcome: Progressing   Problem: Education: Goal: Ability to demonstrate management of disease process will improve Outcome: Progressing Goal: Ability to verbalize understanding of medication therapies will improve Outcome: Progressing Goal: Individualized Educational Video(s) Outcome: Progressing   Problem: Activity: Goal: Capacity to carry out activities will improve Outcome: Progressing   Problem: Cardiac: Goal:  Ability to achieve and maintain adequate cardiopulmonary perfusion will improve Outcome: Progressing Note: Milrinone drip started and CVP monitoring ordered.

## 2019-06-12 NOTE — Telephone Encounter (Signed)
Called and he answered the phone and advised that I was following up with him because he has not been seen by the Anticoagulation Clinic or had his INR managed or Warfarin dosed. He stated he knows and  He just got readmitted to the hospital for PNA. Advised that once he gets discharged that we need to know so he can follow up to get his INR checked. He stated he will do so. Advised that when he goes without his INR being managed he runs the risk of having a stroke, blood clot, or bleeding issue and he verbalized understanding.   Anticoagulation Staff: Will leave this note so that once he is discharged we can ensure he follows up in the office for Anticoagulation management/[place] dosing.

## 2019-06-12 NOTE — Plan of Care (Signed)
  Problem: Education: Goal: Knowledge of General Education information will improve Description Including pain rating scale, medication(s)/side effects and non-pharmacologic comfort measures Outcome: Progressing   

## 2019-06-12 NOTE — Progress Notes (Signed)
Peripherally Inserted Central Catheter/Midline Placement  The IV Nurse has discussed with the patient and/or persons authorized to consent for the patient, the purpose of this procedure and the potential benefits and risks involved with this procedure.  The benefits include less needle sticks, lab draws from the catheter, and the patient may be discharged home with the catheter. Risks include, but not limited to, infection, bleeding, blood clot (thrombus formation), and puncture of an artery; nerve damage and irregular heartbeat and possibility to perform a PICC exchange if needed/ordered by physician.  Alternatives to this procedure were also discussed.  Bard Power PICC patient education guide, fact sheet on infection prevention and patient information card has been provided to patient /or left at bedside.    PICC/Midline Placement Documentation  PICC Triple Lumen 90/38/33 PICC Left Basilic 44 cm 0 cm (Active)  Indication for Insertion or Continuance of Line Chronic illness with exacerbations (CF, Sickle Cell, etc.) 06/12/19 2025  Exposed Catheter (cm) 0 cm 06/12/19 2025  Site Assessment Clean;Dry;Intact 06/12/19 2025  Lumen #1 Status Blood return noted;Flushed;Saline locked 06/12/19 2025  Lumen #2 Status Blood return noted;Flushed;Saline locked 06/12/19 2025  Lumen #3 Status Blood return noted;Flushed;Saline locked 06/12/19 2025  Dressing Type Transparent;Occlusive;Securing device 06/12/19 2025  Dressing Status Clean;Dry;Intact;Antimicrobial disc in place 06/12/19 2025  Line Adjustment (NICU/IV Team Only) No 06/12/19 2025  Dressing Intervention New dressing 06/12/19 2025  Dressing Change Due 06/19/19 06/12/19 2025       Edson Snowball 06/12/2019, 8:47 PM

## 2019-06-12 NOTE — Progress Notes (Signed)
Daily Progress Note   Patient Name: Mark [name]       Date: 06/12/2019 DOB: October 31, 1975  Age: 43 y.o. MRN#: [id number] Attending Physician: Dessa Phi, DO Primary Care Physician: Kerin Perna, NP Admit Date: 06/09/2019  Reason for Consultation/Follow-up: Establishing goals of care  Subjective: I met again at the bedside with Mark [name].  Reports he is doing well other than his room being too hot.  He requested that I adjust the temperature for him.  Discussed with him my conversation with Dr. Haroldine Laws regarding his right heart failure and this being the reason why he may not be a candidate for VAD therapy.  He states that he trusts Dr. Haroldine Laws and is appreciative to know that Dr. Haroldine Laws is going to reevaluate his scans.  Mark [name] is looking for guidance from heart failure team regarding helping him understanding if there is any chance he would be a candidate for VAD therapy in the future or at another institution (mentions again that his father has a VAD managed through [facility]).  If not, he reports needing to have a clear answer exactly why he is not a candidate in order to accept this.  Length of Stay: 3  Current Medications: Scheduled Meds:  . aspirin EC  81 mg Oral Daily  . cefdinir  300 mg Oral Q12H  . digoxin  0.125 mg Oral Daily  . doxycycline  100 mg Oral Q12H  . ipratropium  0.5 mg Nebulization BID  . mometasone-formoterol  2 puff Inhalation BID  . spironolactone  25 mg Oral Daily  . warfarin  2.5 mg Oral Q Tue-1800  . warfarin  5 mg Oral Once per day on Sun Mon Wed Thu Fri Sat  . Warfarin - Physician Dosing Inpatient   Does not apply q1800    Continuous Infusions:   PRN Meds: acetaminophen, albuterol, diphenhydrAMINE  Physical Exam    General: Alert,  awake, in no acute distress. Chronically ill appearing. Heart: Regular rate and rhythm.  Lungs: Good air movement, clear Abdomen: Soft, nontender, nondistended, positive bowel sounds.  Ext: No significant edema Skin: Warm and dry Neuro: Grossly intact, nonfocal.       Vital Signs: BP 102/85 (BP Location: Right Arm)   Pulse 86   Temp 97.9 F (36.6 C) (Oral)   Resp 20  Ht 5' 9" (1.753 m)   Wt 67.5 kg Comment: scale b  SpO2 100%   BMI 21.97 kg/m  SpO2: SpO2: 100 % O2 Device: O2 Device: Room Air O2 Flow Rate:    Intake/output summary:   Intake/Output Summary (Last 24 hours) at 06/12/2019 1732 Last data filed at 06/12/2019 1512 Gross per 24 hour  Intake 1420 ml  Output 1000 ml  Net 420 ml   LBM: Last BM Date: 06/09/19 Baseline Weight: Weight: 67.2 kg Most recent weight: Weight: 67.5 kg(scale b)       Palliative Assessment/Data:    Flowsheet Rows     Most Recent Value  Intake Tab  Referral Department  Cardiology  Unit at Time of Referral  Cardiac/Telemetry Unit  Palliative Care Primary Diagnosis  Cardiac  Date Notified  06/10/19  Palliative Care Type  Return patient Palliative Care  Reason for referral  Clarify Goals of Care  Date of Admission  06/09/19  Date first seen by Palliative Care  06/11/19  # of days Palliative referral response time  1 Day(s)  # of days IP prior to Palliative referral  1  Clinical Assessment  Palliative Performance Scale Score  40%  Psychosocial & Spiritual Assessment  Palliative Care Outcomes  Patient/Family meeting held?  Yes  Who was at the meeting?  Patient, wife via phone  Palliative Care Outcomes  Clarified goals of care      Patient Active Problem List   Diagnosis Date Noted  . Right middle lobe pneumonia 06/09/2019  . Polysubstance abuse ([place]) 06/09/2019  . Chronic hyponatremia 06/09/2019  . Malnutrition of moderate degree 04/10/2019  . Cardiogenic shock ([place]) 04/09/2019  . Allergic reaction caused by a drug  02/14/2019  . Abnormal liver function 02/14/2019  . Encounter for therapeutic drug monitoring 02/13/2019  . Encounter to establish care 02/13/2019  . 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 Acute CHF (congestive heart failure) ([place]) 01/24/2019  . Shortness of breath   . Cocaine use   . Acute on chronic systolic CHF (congestive heart failure) ([place]) 12/25/2018  . Demand ischemia ([place])   . LV (left ventricular) mural thrombus without MI ([place])   . Noncompliance   . Chronic anemia   . Acute systolic CHF (congestive heart failure) ([place]) 11/09/2017  . History of right MCA stroke 09/28/2017  . Stroke ([place]) 09/28/2017  . Chronic systolic heart failure ([place]) 01/11/2016  . NICM (nonischemic cardiomyopathy) ([place]) 01/11/2016  . CKD (chronic kidney disease) stage 2, GFR 60-89 ml/min 01/11/2016  . Cocaine abuse ([place]) 01/11/2016  . Chest pain, pleuritic 01/03/2016  . Tobacco abuse 01/03/2016  . Asthma 01/03/2016  . Leg swelling 01/03/2016  . Tachycardia 01/03/2016  . Normocytic anemia 01/03/2016  . Elevated troponin I level 01/03/2016  . Right rib fracture 01/03/2016  . Chest pain     Palliative Care Assessment & Plan   Patient Profile: 43 y.o. 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  Palliative consulted for goals of care.  I know Mark [name] from prior admission in May of this year.  Recommendations/Plan: -Mark [name] continues to slowly progress in his acceptance and understanding of the severity of his condition. -He reports having good understanding of the information that has been presented to  him regarding his poor heart function, however, he still reports needing more clarity to understand  exactly why he is not a candidate for consideration for VAD therapy.  He trusts Dr. Haroldine Laws and appreciates plan for Dr. Haroldine Laws to review his scans again.  We discussed again that if he is, in fact, not a candidate for VAD, home hospice would likely be most in line with his stated goals.  At the same time, he is very invested in plan to pursue LVAD if it is something that may be offered and states he would need to be "100% sure" that he is not a candidate anywhere prior to accepting plan for transition home with hospice.  Goals of Care and Additional Recommendations:  Limitations on Scope of Treatment: Full Scope Treatment  Code Status:    Code Status Orders  (From admission, onward)         Start     Ordered   06/09/19 1435  Full code  Continuous     06/09/19 1436        Code Status History    Date Active Date Inactive Code Status Order ID Comments User Context   05/09/2019 0439 05/13/2019 1745 Full Code [id number]  Larey Dresser, MD ED   04/09/2019 1528 04/13/2019 1548 Full Code [id number]  Jolaine Artist, MD Inpatient   04/09/2019 1528 04/09/2019 1528 Full Code [id number]  Conrad Crossgate, NP Inpatient   02/14/2019 0208 02/18/2019 1656 Full Code [id number]  Jani Gravel, MD ED   01/25/2019 0142 01/25/2019 1630 Full Code [id number]  Elwyn Reach, MD Inpatient   12/25/2018 0028 12/30/2018 1922 Full Code [id number]  Lenore Cordia, MD ED   11/09/2017 2159 11/14/2017 2051 Full Code [id number]  Rise Patience, MD Inpatient   09/28/2017 0747 10/01/2017 2020 Full Code [id number]  Radene Gunning, NP ED   09/20/2017 2322 09/22/2017 1655 Full Code [id number]  Vianne Bulls, MD ED   02/07/2016 1044 02/08/2016 1802 Full Code [id number]  Norman Herrlich, MD ED   01/03/2016 0520 01/06/2016 1517 Full Code [id number]  Vianne Bulls, MD ED   Advance Care Planning Activity       Prognosis:   Guarded  Discharge Planning:  To Be Determined  Care plan was discussed with patient, RN  Thank you for  allowing the Palliative Medicine Team to assist in the care of this patient.   Time In: 1600 Time Out: 1640 Total Time 40 Prolonged Time Billed No      Greater than 50%  of this time was spent counseling and coordinating care related to the above assessment and plan.  Micheline Rough, MD  Please contact Palliative Medicine Team phone at [phone] for questions and concerns.

## 2019-06-13 ENCOUNTER — Encounter (HOSPITAL_COMMUNITY): Payer: Medicaid Other

## 2019-06-13 LAB — COOXEMETRY PANEL
Carboxyhemoglobin: 0.8 % (ref 0.5–1.5)
Carboxyhemoglobin: 1.2 % (ref 0.5–1.5)
Methemoglobin: 0.7 % (ref 0.0–1.5)
Methemoglobin: 1.1 % (ref 0.0–1.5)
O2 Saturation: 36.5 %
O2 Saturation: 35.5 %
Total hemoglobin: 10.3 g/dL — ABNORMAL LOW (ref 12.0–16.0)
Total hemoglobin: 11.9 g/dL — ABNORMAL LOW (ref 12.0–16.0)

## 2019-06-13 LAB — BLOOD GAS, ARTERIAL
Acid-Base Excess: 7 mmol/L — ABNORMAL HIGH (ref 0.0–2.0)
Bicarbonate: 30.9 mmol/L — ABNORMAL HIGH (ref 20.0–28.0)
Drawn by: 246101
FIO2: 21
O2 Saturation: 95.6 %
Patient temperature: 97.5
pCO2 arterial: 41.6 mmHg (ref 32.0–48.0)
pH, Arterial: 7.48 — ABNORMAL HIGH (ref 7.350–7.450)
pO2, Arterial: 76.9 mmHg — ABNORMAL LOW (ref 83.0–108.0)

## 2019-06-13 LAB — CBC
HCT: 31.8 % — ABNORMAL LOW (ref 39.0–52.0)
Hemoglobin: 10 g/dL — ABNORMAL LOW (ref 13.0–17.0)
MCH: 25.6 pg — ABNORMAL LOW (ref 26.0–34.0)
MCHC: 31.4 g/dL (ref 30.0–36.0)
MCV: 81.5 fL (ref 80.0–100.0)
Platelets: 199 10*3/uL (ref 150–400)
RBC: 3.9 MIL/uL — ABNORMAL LOW (ref 4.22–5.81)
RDW: 16.7 % — ABNORMAL HIGH (ref 11.5–15.5)
WBC: 4.7 10*3/uL (ref 4.0–10.5)
nRBC: 0 % (ref 0.0–0.2)

## 2019-06-13 LAB — BASIC METABOLIC PANEL
Anion gap: 9 (ref 5–15)
BUN: 21 mg/dL — ABNORMAL HIGH (ref 6–20)
CO2: 27 mmol/L (ref 22–32)
Calcium: 8.7 mg/dL — ABNORMAL LOW (ref 8.9–10.3)
Chloride: 96 mmol/L — ABNORMAL LOW (ref 98–111)
Creatinine, Ser: 1.06 mg/dL (ref 0.61–1.24)
GFR calc Af Amer: >60 mL/min (ref >60)
GFR calc non Af Amer: >60 mL/min (ref >60)
Glucose, Bld: 98 mg/dL (ref 70–99)
Potassium: 4.1 mmol/L (ref 3.5–5.1)
Sodium: 132 mmol/L — ABNORMAL LOW (ref 135–145)

## 2019-06-13 LAB — PROTIME-INR
INR: 2.9 — ABNORMAL HIGH (ref 0.8–1.2)
Prothrombin Time: 29.8 seconds — ABNORMAL HIGH (ref 11.4–15.2)

## 2019-06-13 LAB — RAPID URINE DRUG SCREEN, HOSP PERFORMED
Amphetamines: NOT DETECTED
Barbiturates: NOT DETECTED
Benzodiazepines: NOT DETECTED
Cocaine: NOT DETECTED
Opiates: NOT DETECTED
Tetrahydrocannabinol: NOT DETECTED

## 2019-06-13 LAB — CULTURE, RESPIRATORY W GRAM STAIN

## 2019-06-13 MED ORDER — MILRINONE LACTATE IN DEXTROSE 20-5 MG/100ML-% IV SOLN
0.3750 ug/kg/min | INTRAVENOUS | Status: DC
  Administered 2019-06-13: 08:00:00 0.25 ug/kg/min via INTRAVENOUS
  Administered 2019-06-13 – 2019-06-18 (×10): 0.375 ug/kg/min via INTRAVENOUS
  Filled 2019-06-13 (×9): qty 100

## 2019-06-13 MED ORDER — FUROSEMIDE 10 MG/ML IJ SOLN
80.0000 mg | Freq: Two times a day (BID) | INTRAMUSCULAR | Status: DC
  Administered 2019-06-13 – 2019-06-16 (×4): 80 mg via INTRAVENOUS
  Filled 2019-06-13 (×8): qty 8

## 2019-06-13 MED ORDER — CHLORHEXIDINE GLUCONATE CLOTH 2 % EX PADS
6.0000 | MEDICATED_PAD | Freq: Every day | CUTANEOUS | Status: DC
  Administered 2019-06-14 – 2019-06-17 (×4): 6 via TOPICAL

## 2019-06-13 MED ORDER — FUROSEMIDE 10 MG/ML IJ SOLN
80.0000 mg | Freq: Three times a day (TID) | INTRAMUSCULAR | Status: DC
  Administered 2019-06-13: 09:00:00 80 mg via INTRAVENOUS
  Filled 2019-06-13: qty 8

## 2019-06-13 NOTE — Progress Notes (Signed)
Voicemail left to notify pulmonary lab regarding PFT order.

## 2019-06-13 NOTE — Progress Notes (Signed)
PROGRESS NOTE    Mark ScottJeremie C Andress  XLK:[id number] DOB: 02/08/1976 DOA: 06/09/2019 PCP: Grayce SessionsEdwards, Michelle P, NP     Brief Narrative:  Mark [name] is a 43 year old male with past medical history of chronic systolic heart failure (EF 10 to 15% with left apical thrombus with severely diffuse hypokinesis 12/2018) NICM by cardiac cath in 2017, history of right MCA CVA 2019, recurrent cardiogenic shock, asthma, polysubstance abuse who presented to the ED on 10/18 with complaints of productive cough, fever and body aches x2 weeks and found to have WBC 13 with chest x-ray with right middle lobe pneumonia, Covid negative and on admission his weight was up 13 pounds from previous discharge.  He was started on Vanco/cefepime in ED and changed to Rocephin/azithromycin for CAP therapy and continued on remaining home meds.  Overnight 10/18-10/19 a rapid response for hypotension was called after patient received his home BP meds and patient received 250 cc bolus NS with slightly improved BP however again had drop in blood pressure.  He received albumin x2 for recurrent hypotension throughout the same day after his blood pressure meds were held.  He remained hypotensive but asymptomatic.  Cardiology was consulted who deemed patient not a candidate for advanced heart failure therapies due to RV dysfunction substance abuse and palliative care was consulted.  Patient was concerned about having an adverse reaction to azithromycin and was changed to doxycycline/Rocephin for CAP coverage, then eventually deescalated to cefdinir, doxycycline.   New events last 24 hours / Subjective: No new complaints this morning. Denies CP, worsening SOB.   Assessment & Plan:   Principal Problem:   Right middle lobe pneumonia Active Problems:   Tobacco abuse   Asthma   Chronic systolic heart failure (HCC)   NICM (nonischemic cardiomyopathy) (HCC)   History of right MCA stroke   Acute on chronic systolic (congestive) heart failure  (HCC)   Polysubstance abuse (HCC)   Chronic hyponatremia    Strep pneumonia CAP in setting of right middle lobe pneumonia  -Positive S pneumo urinary antigen, blood cultures with no growth to date, Covid negative.  Antibiotics 10/18-10/22 for total 5 day course.   Acute on chronic biventricular heart failure with low output  -History of nonischemic cardiomyopathy.  EF 5 to 10% in May 2020, RV severely dysfunctional.  Had RHC in August with low output and high filling pressure and required inotropic support, previously on milrinone while hospitalized but refused home milrinone.  Not a candidate for LVAD with right heart failure.  BNP elevated but at/below baseline.  Weight up but asymptomatic.  Ran out of spironolactone 2 days prior to admit.  Cardiology consulted -Continue to hold Losartan -Continue digoxin, spironolactone, lasix IV, milrinone gtt   Hypotension  -Improved  Apical thrombus  -Continue Coumadin   AKI  -Resolved  Chronic hyponatremia secondary to chronic heart failure  -Asymptomatic, stable. Monitor  Asthma  -Well controlled. Monitor  Polysubstance abuse  -UDS negative, and history of cocaine in the past  MCA CVA 2019  -No residual weakness.   Continue Coumadin, aspirin  Tobacco  -Cessation counseling     DVT prophylaxis: Coumadin Code Status: Full Family Communication: None at bedside Disposition Plan: Pending further heart failure team recommendations. On milrinone gtt currently.    Consultants:   Advanced heart failure  Palliative care   Procedures:   None   Antimicrobials:  Anti-infectives (From admission, onward)   Start     Dose/Rate Route Frequency Ordered Stop   06/11/19 1000  cefdinir (OMNICEF) capsule 300 mg  Status:  Discontinued    Note to Pharmacy: Has been tolerating cephalosporins   300 mg Oral Every 12 hours 06/11/19 0804 06/11/19 0804   06/11/19 1000  cefdinir (OMNICEF) capsule 300 mg    Note to Pharmacy: Has been  tolerating cephalosporins   300 mg Oral Every 12 hours 06/11/19 0804 06/15/19 0959   06/10/19 2200  doxycycline (VIBRA-TABS) tablet 100 mg     100 mg Oral Every 12 hours 06/10/19 1607 06/14/19 2159   06/10/19 0500  vancomycin (VANCOCIN) IVPB 750 mg/150 ml premix  Status:  Discontinued     750 mg 150 mL/hr over 60 Minutes Intravenous Every 12 hours 06/09/19 1402 06/09/19 1629   06/09/19 2300  ceFEPIme (MAXIPIME) 2 g in sodium chloride 0.9 % 100 mL IVPB  Status:  Discontinued     2 g 200 mL/hr over 30 Minutes Intravenous Every 8 hours 06/09/19 1400 06/09/19 1629   06/09/19 2300  cefTRIAXone (ROCEPHIN) 1 g in sodium chloride 0.9 % 100 mL IVPB  Status:  Discontinued     1 g 200 mL/hr over 30 Minutes Intravenous Every 24 hours 06/09/19 1628 06/11/19 0804   06/09/19 1630  azithromycin (ZITHROMAX) 500 mg in sodium chloride 0.9 % 250 mL IVPB  Status:  Discontinued     500 mg 250 mL/hr over 60 Minutes Intravenous Every 24 hours 06/09/19 1628 06/10/19 1607   06/09/19 1400  ceFEPIme (MAXIPIME) 2 g in sodium chloride 0.9 % 100 mL IVPB     2 g 200 mL/hr over 30 Minutes Intravenous  Once 06/09/19 1359 06/09/19 1521   06/09/19 1400  vancomycin (VANCOCIN) 1,250 mg in sodium chloride 0.9 % 250 mL IVPB  Status:  Discontinued     1,250 mg 166.7 mL/hr over 90 Minutes Intravenous  Once 06/09/19 1359 06/09/19 1700       Objective: Vitals:   06/13/19 0031 06/13/19 0538 06/13/19 0722 06/13/19 0815  BP: 104/87 105/78 117/87 (!) 87/77  Pulse: (!) 102 100 94   Resp: 18 18  (!) 23  Temp: 98.5 F (36.9 C) 98.4 F (36.9 C) 98.5 F (36.9 C)   TempSrc:  Oral Oral   SpO2: 100% 99% 99%   Weight:  67 kg    Height:        Intake/Output Summary (Last 24 hours) at 06/13/2019 0932 Last data filed at 06/13/2019 9379 Gross per 24 hour  Intake 1375.49 ml  Output 2275 ml  Net -899.51 ml   Filed Weights   06/11/19 0503 06/12/19 0436 06/13/19 0538  Weight: 67.6 kg 67.5 kg 67 kg    Examination: General  exam: Appears calm and comfortable  Respiratory system: Diminished breath sounds. Respiratory effort normal. Cardiovascular system: S1 & S2 heard. No pedal edema. Gastrointestinal system: Abdomen is nondistended, soft and nontender. Normal bowel sounds heard. Central nervous system: Alert and oriented. Non focal exam. Speech clear  Extremities: Symmetric in appearance bilaterally  Skin: No rashes, lesions or ulcers on exposed skin  Psychiatry: Judgement and insight appear stable. Mood & affect appropriate.     Data Reviewed: I have personally reviewed following labs and imaging studies  CBC: Recent Labs  Lab 06/09/19 1140 06/10/19 0038 06/11/19 0254 06/12/19 0448 06/13/19 0500  WBC 13.1* 12.3* 6.2 5.8 4.7  NEUTROABS  --  9.8*  --   --   --   HGB 12.8* 11.2* 11.0* 11.1* 10.0*  HCT 39.4 34.9* 33.2* 35.1* 31.8*  MCV 80.2 81.2 79.6*  80.3 81.5  PLT 196 176 206 230 199   Basic Metabolic Panel: Recent Labs  Lab 06/09/19 1140 06/10/19 0038 06/11/19 0254 06/12/19 0448 06/13/19 0500  NA 129* 130* 130* 132* 132*  K 5.1 4.1 3.9 3.8 4.1  CL 93* 94* 96* 98 96*  CO2 24 25 24 24 27   GLUCOSE 107* 127* 97 110* 98  BUN 21* 24* 22* 24* 21*  CREATININE 1.30* 1.52* 1.09 1.21 1.06  CALCIUM 8.8* 8.0* 8.6* 8.8* 8.7*  MG 1.6*  --   --   --   --   PHOS 2.6  --   --   --   --    GFR: Estimated Creatinine Clearance: 83.4 mL/min (by C-G formula based on SCr of 1.06 mg/dL). Liver Function Tests: Recent Labs  Lab 06/10/19 0038  AST 35  ALT 19  ALKPHOS 75  BILITOT 2.5*  PROT 6.1*  ALBUMIN 2.5*   No results for input(s): LIPASE, AMYLASE in the last 168 hours. No results for input(s): AMMONIA in the last 168 hours. Coagulation Profile: Recent Labs  Lab 06/09/19 1406 06/10/19 0038 06/11/19 0254 06/12/19 0448 06/13/19 0500  INR 2.3* 2.6* 3.1* 2.7* 2.9*   Cardiac Enzymes: No results for input(s): CKTOTAL, CKMB, CKMBINDEX, TROPONINI in the last 168 hours. BNP (last 3 results) No  results for input(s): PROBNP in the last 8760 hours. HbA1C: No results for input(s): HGBA1C in the last 72 hours. CBG: Recent Labs  Lab 06/10/19 1153  GLUCAP 148*   Lipid Profile: No results for input(s): CHOL, HDL, LDLCALC, TRIG, CHOLHDL, LDLDIRECT in the last 72 hours. Thyroid Function Tests: No results for input(s): TSH, T4TOTAL, FREET4, T3FREE, THYROIDAB in the last 72 hours. Anemia Panel: No results for input(s): VITAMINB12, FOLATE, FERRITIN, TIBC, IRON, RETICCTPCT in the last 72 hours. Sepsis Labs: Recent Labs  Lab 06/09/19 1140 06/09/19 1540 06/10/19 0038 06/10/19 1110 06/10/19 1417  PROCALCITON 5.97  --   --   --   --   LATICACIDVEN  --  1.6 2.6* 1.4 1.4    Recent Results (from the past 240 hour(s))  SARS CORONAVIRUS 2 (TAT 6-24 HRS) Nasopharyngeal Nasopharyngeal Swab     Status: None   Collection Time: 06/09/19  1:30 PM   Specimen: Nasopharyngeal Swab  Result Value Ref Range Status   SARS Coronavirus 2 NEGATIVE NEGATIVE Final    Comment: (NOTE) SARS-CoV-2 target nucleic acids are NOT DETECTED. The SARS-CoV-2 RNA is generally detectable in upper and lower respiratory specimens during the acute phase of infection. Negative results do not preclude SARS-CoV-2 infection, do not rule out co-infections with other pathogens, and should not be used as the sole basis for treatment or other patient management decisions. Negative results must be combined with clinical observations, patient history, and epidemiological information. The expected result is Negative. Fact Sheet for Patients: 06/11/19 Fact Sheet for Healthcare Providers: HairSlick.no This test is not yet approved or cleared by the quierodirigir.com FDA and  has been authorized for detection and/or diagnosis of SARS-CoV-2 by FDA under an Emergency Use Authorization (EUA). This EUA will remain  in effect (meaning this test can be used) for the duration  of the COVID-19 declaration under Section 56 4(b)(1) of the Act, 21 U.S.C. section 360bbb-3(b)(1), unless the authorization is terminated or revoked sooner. Performed at [facility], [address] [place], [place]   Culture, blood (routine x 2)     Status: None (Preliminary result)   Collection Time: 06/09/19  3:40  PM   Specimen: BLOOD LEFT FOREARM  Result Value Ref Range Status   Specimen Description BLOOD LEFT FOREARM  Final   Special Requests   Final    BOTTLES DRAWN AEROBIC AND ANAEROBIC Blood Culture adequate volume   Culture   Final    NO GROWTH 4 DAYS Performed at [facility], [address] [place], [place] [address]    Report Status PENDING  Incomplete  Culture, blood (routine x 2)     Status: None (Preliminary result)   Collection Time: 06/09/19  4:38 PM   Specimen: BLOOD RIGHT HAND  Result Value Ref Range Status   Specimen Description BLOOD RIGHT HAND  Final   Special Requests   Final    BOTTLES DRAWN AEROBIC AND ANAEROBIC Blood Culture results may not be optimal due to an inadequate volume of blood received in culture bottles   Culture   Final    NO GROWTH 4 DAYS Performed at [facility], [address] [place], [place] [address]    Report Status PENDING  Incomplete  Culture, sputum-assessment     Status: None   Collection Time: 06/10/19  6:55 PM   Specimen: Expectorated Sputum  Result Value Ref Range Status   Specimen Description EXPECTORATED SPUTUM  Final   Special Requests NONE  Final   Sputum evaluation   Final    THIS SPECIMEN IS ACCEPTABLE FOR SPUTUM CULTURE Performed at [facility], [address] [place], [place] [address]    Report Status 06/10/2019 FINAL  Final  MRSA PCR Screening     Status: None   Collection Time: 06/10/19  6:55 PM   Specimen: Nasal Mucosa; Nasopharyngeal  Result Value Ref Range Status   MRSA by PCR NEGATIVE NEGATIVE Final    Comment:        The GeneXpert MRSA Assay (FDA approved for  NASAL specimens only), is one component of a comprehensive MRSA colonization surveillance program. It is not intended to diagnose MRSA infection nor to guide or monitor treatment for MRSA infections. Performed at [facility], [address] [place], [place] [address]   Culture, respiratory     Status: None (Preliminary result)   Collection Time: 06/10/19  6:55 PM  Result Value Ref Range Status   Specimen Description EXPECTORATED SPUTUM  Final   Special Requests NONE Reflexed from [id number]  Final   Gram Stain   Final    ABUNDANT WBC PRESENT,BOTH PMN AND MONONUCLEAR RARE GRAM VARIABLE ROD RARE GRAM POSITIVE COCCI    Culture   Final    CULTURE REINCUBATED FOR BETTER GROWTH Performed at [facility], [address] [place], [place] [address]    Report Status PENDING  Incomplete      Radiology Studies: [place] Ekg Site Rite  Result Date: 06/12/2019 If Site Rite image not attached, placement could not be confirmed due to current cardiac rhythm.     Scheduled Meds: . aspirin EC  81 mg Oral Daily  . cefdinir  300 mg Oral Q12H  . Chlorhexidine Gluconate Cloth  6 each Topical Daily  . digoxin  0.125 mg Oral Daily  . doxycycline  100 mg Oral Q12H  . furosemide  80 mg Intravenous Q8H  . ipratropium  0.5 mg Nebulization BID  . mometasone-formoterol  2 puff Inhalation BID  . spironolactone  25 mg Oral Daily  . warfarin  2.5 mg Oral Q Tue-1800  . warfarin  5 mg Oral Once per  day on Sun Mon Wed Thu Fri Sat   Continuous Infusions: . milrinone 0.25 mcg/kg/min (06/13/19 0741)     LOS: 4 days      Time spent: 25 minutes   Noralee Stain, DO [facility] 06/13/2019, 9:32 AM   Available via Epic secure chat 7am-7pm After these hours, please refer to coverage provider listed on [email or web address]

## 2019-06-13 NOTE — Progress Notes (Signed)
Approx 1140 patient complain of cramping in right upper abdomen near chest. Stated started suddenly and thinks it is due to frequent urination (diuresis due to lasix). Stated he does not want to take additional Lasix dose scheduled for today at 1400.  Paged DO Choi at 1236. Informed pt. cramping RUQ.Refusing 1400 Lasix as aggressive diuresis (2300 mL since 0830)may be causing onset of cramps. Informed to contact cardio.  Paged cardio at [phone]. Spoke with [facility] PA face to face at approx 1310 and informed pt. reporting cramping and refused IV Lasix scheduled for 1400. Rosita Fire will see patient and notify team/update if appropriate to modify order.

## 2019-06-13 NOTE — Progress Notes (Signed)
ANTICOAGULATION CONSULT NOTE - Initial Consult  Pharmacy Consult for warfarin Indication: apical thrombus  Allergies  Allergen Reactions  . Clindamycin/Lincomycin Swelling and Palpitations  . Hydrocodone Hives  . Lisinopril Swelling and Other (See Comments)    Facial swelling/angioedema  . Olopatadine Hcl Swelling    Makes his face swell up  . Prednisone Shortness Of Breath, Nausea Only, Swelling and Other (See Comments)    Also made chest feel tight and genital area, legs, and face became swollen badly  . Lasix [Furosemide] Hives and Swelling    Facial swelling Reaction to name brand LASIX  . Penicillins Hives and Swelling     Has patient had a PCN reaction causing immediate rash, facial/tongue/throat swelling, SOB or lightheadedness with hypotension: Yes Has patient had a PCN reaction causing severe rash involving mucus membranes or skin necrosis: No Has patient had a PCN reaction that required hospitalization: No Has patient had a PCN reaction occurring within the last 10 years: No If all of the above answers are "NO", then may proceed with Cephalosporin use.   . Bactrim [Sulfamethoxazole-Trimethoprim] Swelling and Rash    Facial swelling    Patient Measurements: Height: 5\' 9"  (175.3 cm) Weight: 147 lb 9.6 oz (67 kg)(scale b) IBW/kg (Calculated) : 70.7  Vital Signs: Temp: 98.5 F (36.9 C) (10/22 0722) Temp Source: Oral (10/22 0722) BP: 87/77 (10/22 0815) Pulse Rate: 94 (10/22 0722)  Labs: Recent Labs    06/11/19 0254 06/12/19 0448 06/13/19 0500  HGB 11.0* 11.1* 10.0*  HCT 33.2* 35.1* 31.8*  PLT 206 230 199  LABPROT 31.2* 28.3* 29.8*  INR 3.1* 2.7* 2.9*  CREATININE 1.09 1.21 1.06    Estimated Creatinine Clearance: 83.4 mL/min (by C-G formula based on SCr of 1.06 mg/dL).   Medical History: Past Medical History:  Diagnosis Date  . Asthma   . Chronic systolic CHF (congestive heart failure) ([place])   . Cigarette smoker   . CKD (chronic kidney disease),  stage II    Archie Endo 10/01/2017  . COPD (chronic obstructive pulmonary disease) ([place]) 10/21/2017   on CT scan chest  . History of echocardiogram    a. Echo 5/17 - EF 20-25%, severe diff HK, restrictive physiology, mild to mod MR, severe reduced RVSF, mod RVE, mild RAE, mod TR, PASP 48 mmHg  . Hx of cardiac cath    a. LHC 5/17 - normal coronary arteries. PA 45/25, mean 33, PCWP mean 18  . NICM (nonischemic cardiomyopathy) ([place])   . Stroke ([place]) 09/27/2017   "was weak on my left side; I'm fully recovered" (11/09/2017)  . Substance abuse ([place])    cocaine, marijuana     Assessment: 37 yoM with end-stage cardiomyopathy admitted with acute CHF. Pt on warfarin PTA for hx of apical thrombus on ECHO 12/2018. Pt resumed on home warfarin regimen, INR has remained therapeutic. LVAD workup ongoing, so pharmacy will assume dosing.  *PTA dose = 5mg  daily except 2.5mg  Tues  Goal of Therapy:  INR 2-3 Monitor platelets by anticoagulation protocol: Yes   Plan:  -Warfarin 5mg  daily except 2.5mg  Tues -Daily protime   Arrie Senate, PharmD, BCPS Clinical Pharmacist [phone] Please check AMION for all [place] numbers 06/13/2019

## 2019-06-13 NOTE — Progress Notes (Signed)
Patient stating that he needs no assistance w/ his CPAP for the night. He will place self on once he is ready for bed.

## 2019-06-13 NOTE — Progress Notes (Addendum)
Advanced Heart Failure Rounding Note  PCP-Cardiologist: Verne Carrow, MD   Subjective:    Yesterday milrinone 0.125 mcg started. CO-OX remains severely reduced at 36%.   Denies SOB. Wants to pursue LVAD.   Objective:   Weight Range: 67 kg Body mass index is 21.8 kg/m.   Vital Signs:   Temp:  [97.4 F (36.3 C)-98.5 F (36.9 C)] 98.4 F (36.9 C) (10/22 0538) Pulse Rate:  [53-102] 100 (10/22 0538) Resp:  [18-21] 18 (10/22 0538) BP: (102-118)/(78-95) 105/78 (10/22 0538) SpO2:  [99 %-100 %] 99 % (10/22 0538) Weight:  [67 kg] 67 kg (10/22 0538) Last BM Date: 06/09/19  Weight change: Filed Weights   06/11/19 0503 06/12/19 0436 06/13/19 0538  Weight: 67.6 kg 67.5 kg 67 kg    Intake/Output:   Intake/Output Summary (Last 24 hours) at 06/13/2019 0707 Last data filed at 06/13/2019 0600 Gross per 24 hour  Intake 1107.91 ml  Output 2300 ml  Net -1192.09 ml      Physical Exam   CVP 15  General:  Well appearing. No resp difficulty HEENT: normal Neck: supple. To jaw . Carotids 2+ bilat; no bruits. No lymphadenopathy or thryomegaly appreciated. Cor: PMI nondisplaced. Regular rate & rhythm. No rubs, or murmurs. + S3  Lungs: Crackles in bases Abdomen: soft, nontender, nondistended. No hepatosplenomegaly. No bruits or masses. Good bowel sounds. Extremities: no cyanosis, clubbing, rash, edema. RUE PICC  Neuro: alert & orientedx3, cranial nerves grossly intact. moves all 4 extremities w/o difficulty. Affect pleasant  Telemetry   SR/SR 80-100s personally reviewed   EKG    N/A  Labs    CBC Recent Labs    06/12/19 0448 06/13/19 0500  WBC 5.8 4.7  HGB 11.1* 10.0*  HCT 35.1* 31.8*  MCV 80.3 81.5  PLT 230 199   Basic Metabolic Panel Recent Labs    29/56/21 0448 06/13/19 0500  NA 132* 132*  K 3.8 4.1  CL 98 96*  CO2 24 27  GLUCOSE 110* 98  BUN 24* 21*  CREATININE 1.21 1.06  CALCIUM 8.8* 8.7*   Liver Function Tests No results for input(s):  AST, ALT, ALKPHOS, BILITOT, PROT, ALBUMIN in the last 72 hours. No results for input(s): LIPASE, AMYLASE in the last 72 hours. Cardiac Enzymes No results for input(s): CKTOTAL, CKMB, CKMBINDEX, TROPONINI in the last 72 hours.  BNP: BNP (last 3 results) Recent Labs    02/13/19 2243 05/08/19 2018 06/09/19 1140  BNP 3,902.9* 3,017.6* 2,175.3*    ProBNP (last 3 results) No results for input(s): PROBNP in the last 8760 hours.   D-Dimer No results for input(s): DDIMER in the last 72 hours. Hemoglobin A1C No results for input(s): HGBA1C in the last 72 hours. Fasting Lipid Panel No results for input(s): CHOL, HDL, LDLCALC, TRIG, CHOLHDL, LDLDIRECT in the last 72 hours. Thyroid Function Tests No results for input(s): TSH, T4TOTAL, T3FREE, THYROIDAB in the last 72 hours.  Invalid input(s): FREET3  Other results:   Imaging    [place] Ekg Site Rite  Result Date: 06/12/2019 If Site Rite image not attached, placement could not be confirmed due to current cardiac rhythm.    Medications:     Scheduled Medications: . aspirin EC  81 mg Oral Daily  . cefdinir  300 mg Oral Q12H  . Chlorhexidine Gluconate Cloth  6 each Topical Daily  . digoxin  0.125 mg Oral Daily  . doxycycline  100 mg Oral Q12H  . furosemide  80 mg Intravenous BID  .  ipratropium  0.5 mg Nebulization BID  . mometasone-formoterol  2 puff Inhalation BID  . spironolactone  25 mg Oral Daily  . warfarin  2.5 mg Oral Q Tue-1800  . warfarin  5 mg Oral Once per day on Sun Mon Wed Thu Fri Sat  . Warfarin - Physician Dosing Inpatient   Does not apply q1800    Infusions: . milrinone 0.125 mcg/kg/min (06/12/19 2050)    PRN Medications: acetaminophen, albuterol, diphenhydrAMINE, sodium chloride flush    Patient Profile   43 y/o male with 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 Has had several recent admits for cardiogenic shock supported with milrinone but has refused home inotropes. Now readmitted with RML PNA and  recurrent low output HF and lactic acidosis. He is not candidate for advanced therapies due to severe RV dysfunction and substance abuse.   Assessment/Plan   1. Rt Middle Lobe PNA: COVID negative. Step Pneumo +. Blood cultures negative. Received IV Vanco and cefepime in ED>>changed to cefdinir + doxycycline.  - WBC normalized.  - Afebrile - Continue abx  2. Acute on Chronic Biventricular Heart Failure w/ Low Output: History of nonischemic cardiomyopathy. Lastecho  5/20 showed that EF is markedly low at 5-10% with LV apical thrombus. The RV is severely dysfunctional as well. RHC in 8/20 with low output and high filling pressures. He required inotropic support previous 2 admits (initial co-ox in the 30s>>improved to 63% w/ milrinone) but refuses home milrinone. Not a candidate for LVAD w/ severe RV failure and substance abuse.  -We discussed home inotropes but he continues to refuse. We have consulted palliative care to see today to discuss hospice.   -He has spoken to the Hospice team and has told them he is now interested in LVAD.  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 He has also previously refused VAD saying he would continue to do anything he wants to do and not be able to comply with what was needed to take care of a VAD. -Wants to proceed with VAD w/u. He understands  RV dysfunction amy be prohibitive but we will place a PICC and get him on milrinone and then repeat RHC and echo and see if RV is recruitable.  - On milrinone 0.125 mcg.---> CO-OX 36.5% . Increase milrinone 0.25 mcg. Check CO-OX 1100.  -CVP 15. Increase IV lasix 80 mg three times a day.  - Continue digoxin and spiro  - Renal function stable.  - He interested in LVAD.   3. Apical Thrombus: Identified on ECHO 12/2018.  -on coumadin. INR 2.9 . No bleeding. Discussed dosing with PharmD personally.   4. H/O MCA CVA 2019 :  - Continue atorvastatin 40 mg daily. - Continue Coumadin + aspirin 81 mg daily.  - INR  therapeutic at 2.9   5. AKI: Scr 1.30 on admit and increased to 1.52 yesterday (baseline ~1.0) - suspect 2/2 low output HF and hypotension  - previously required milrinone last 2 admits but refuses home ionotropes -Creatinine 1.06   6. CODE STATUS: Full code for now. See discussion above  Length of Stay: 4  Amy Clegg, NP  06/13/2019, 7:07 AM  Advanced Heart Failure Team Pager [phone] (M-F; 7a - 4p)  Please contact [facility] for night-coverage after hours (4p -7a ) and weekends on [email or web address]   Patient seen and examined with the above-signed Advanced Practice Provider and/or Housestaff. I personally reviewed laboratory data, imaging studies and relevant notes. I independently examined the patient and formulated the important  aspects of the plan. I have edited the note to reflect any of my changes or salient points. I have personally discussed the plan with the patient and/or family.  PICC line placed yesterday and milrinone started. Co-ox < 40% despite milrinone support at 0.125. CVP 12. Patient now adamant he is interested in VAD. However RV dysfunction may be prohibitive. Will try to optimize over the weekend with milrinone and IV diuresis and repeat RHC early next week to reassess full hemodynamics. Will re-engage VAD team to re-assess with particular attention to social hurdles and ability to comply with VAD management.  Glori Bickers, MD  8:17 AM

## 2019-06-14 ENCOUNTER — Inpatient Hospital Stay (HOSPITAL_COMMUNITY): Payer: Medicaid Other

## 2019-06-14 DIAGNOSIS — I5023 Acute on chronic systolic (congestive) heart failure: Secondary | ICD-10-CM

## 2019-06-14 LAB — BASIC METABOLIC PANEL
Anion gap: 7 (ref 5–15)
BUN: 17 mg/dL (ref 6–20)
CO2: 32 mmol/L (ref 22–32)
Calcium: 8.9 mg/dL (ref 8.9–10.3)
Chloride: 94 mmol/L — ABNORMAL LOW (ref 98–111)
Creatinine, Ser: 1.04 mg/dL (ref 0.61–1.24)
GFR calc Af Amer: >60 mL/min (ref >60)
GFR calc non Af Amer: >60 mL/min (ref >60)
Glucose, Bld: 110 mg/dL — ABNORMAL HIGH (ref 70–99)
Potassium: 3.8 mmol/L (ref 3.5–5.1)
Sodium: 133 mmol/L — ABNORMAL LOW (ref 135–145)

## 2019-06-14 LAB — PULMONARY FUNCTION TEST
DL/VA % pred: 79 %
DL/VA: 3.63 ml/min/mmHg/L
DLCO cor % pred: 46 %
DLCO cor: 13.64 ml/min/mmHg
DLCO unc % pred: 39 %
DLCO unc: 11.47 ml/min/mmHg
FEF 25-75 Post: 0.52 L/sec
FEF 25-75 Pre: 0.27 L/sec
FEF2575-%Change-Post: 90 %
FEF2575-%Pred-Post: 14 %
FEF2575-%Pred-Pre: 7 %
FEV1-%Change-Post: 18 %
FEV1-%Pred-Post: 28 %
FEV1-%Pred-Pre: 24 %
FEV1-Post: 0.97 L
FEV1-Pre: 0.81 L
FEV1FVC-%Change-Post: -15 %
FEV1FVC-%Pred-Pre: 48 %
FEV6-%Change-Post: 30 %
FEV6-%Pred-Post: 56 %
FEV6-%Pred-Pre: 43 %
FEV6-Post: 2.29 L
FEV6-Pre: 1.76 L
FEV6FVC-%Change-Post: -7 %
FEV6FVC-%Pred-Post: 79 %
FEV6FVC-%Pred-Pre: 86 %
FVC-%Change-Post: 40 %
FVC-%Pred-Post: 71 %
FVC-%Pred-Pre: 50 %
FVC-Post: 2.94 L
FVC-Pre: 2.09 L
Post FEV1/FVC ratio: 33 %
Post FEV6/FVC ratio: 78 %
Pre FEV1/FVC ratio: 39 %
Pre FEV6/FVC Ratio: 84 %
RV % pred: 618 %
RV: 11.61 L
TLC % pred: 203 %
TLC: 13.75 L

## 2019-06-14 LAB — COOXEMETRY PANEL
Carboxyhemoglobin: 1.4 % (ref 0.5–1.5)
Methemoglobin: 1.4 % (ref 0.0–1.5)
O2 Saturation: 47.5 %
Total hemoglobin: 10.7 g/dL — ABNORMAL LOW (ref 12.0–16.0)

## 2019-06-14 LAB — CULTURE, BLOOD (ROUTINE X 2)
Culture: NO GROWTH
Culture: NO GROWTH
Special Requests: ADEQUATE

## 2019-06-14 LAB — PROTIME-INR
INR: 2.9 — ABNORMAL HIGH (ref 0.8–1.2)
Prothrombin Time: 29.6 seconds — ABNORMAL HIGH (ref 11.4–15.2)

## 2019-06-14 MED ORDER — SODIUM CHLORIDE 0.9% FLUSH
3.0000 mL | Freq: Two times a day (BID) | INTRAVENOUS | Status: DC
  Administered 2019-06-14 – 2019-06-17 (×5): 3 mL via INTRAVENOUS

## 2019-06-14 MED ORDER — POTASSIUM CHLORIDE CRYS ER 20 MEQ PO TBCR
40.0000 meq | EXTENDED_RELEASE_TABLET | Freq: Once | ORAL | Status: AC
  Administered 2019-06-14: 40 meq via ORAL
  Filled 2019-06-14: qty 2

## 2019-06-14 NOTE — Plan of Care (Signed)
  Problem: Education: Goal: Knowledge of General Education information will improve Description Including pain rating scale, medication(s)/side effects and non-pharmacologic comfort measures Outcome: Progressing   

## 2019-06-14 NOTE — Progress Notes (Signed)
ANTICOAGULATION CONSULT NOTE - [facility] for warfarin Indication: apical thrombus  Allergies  Allergen Reactions  . Clindamycin/Lincomycin Swelling and Palpitations  . Hydrocodone Hives  . Lisinopril Swelling and Other (See Comments)    Facial swelling/angioedema  . Olopatadine Hcl Swelling    Makes his face swell up  . Prednisone Shortness Of Breath, Nausea Only, Swelling and Other (See Comments)    Also made chest feel tight and genital area, legs, and face became swollen badly  . Lasix [Furosemide] Hives and Swelling    Facial swelling Reaction to name brand LASIX  . Penicillins Hives and Swelling     Has patient had a PCN reaction causing immediate rash, facial/tongue/throat swelling, SOB or lightheadedness with hypotension: Yes Has patient had a PCN reaction causing severe rash involving mucus membranes or skin necrosis: No Has patient had a PCN reaction that required hospitalization: No Has patient had a PCN reaction occurring within the last 10 years: No If all of the above answers are "NO", then may proceed with Cephalosporin use.   . Bactrim [Sulfamethoxazole-Trimethoprim] Swelling and Rash    Facial swelling    Patient Measurements: Height: 5\' 9"  (175.3 cm) Weight: 143 lb 8 oz (65.1 kg) IBW/kg (Calculated) : 70.7  Vital Signs: Temp: 97.5 F (36.4 C) (10/23 0446) Temp Source: Oral (10/23 0446) BP: 97/69 (10/23 0446) Pulse Rate: 108 (10/23 0446)  Labs: Recent Labs    06/12/19 0448 06/13/19 0500 06/14/19 0432  HGB 11.1* 10.0*  --   HCT 35.1* 31.8*  --   PLT 230 199  --   LABPROT 28.3* 29.8* 29.6*  INR 2.7* 2.9* 2.9*  CREATININE 1.21 1.06 1.04    Estimated Creatinine Clearance: 82.6 mL/min (by C-G formula based on SCr of 1.04 mg/dL).   Medical History: Past Medical History:  Diagnosis Date  . Asthma   . Chronic systolic CHF (congestive heart failure) ([place])   . Cigarette smoker   . CKD (chronic kidney disease), stage II     Archie Endo 10/01/2017  . COPD (chronic obstructive pulmonary disease) ([place]) 10/21/2017   on CT scan chest  . History of echocardiogram    a. Echo 5/17 - EF 20-25%, severe diff HK, restrictive physiology, mild to mod MR, severe reduced RVSF, mod RVE, mild RAE, mod TR, PASP 48 mmHg  . Hx of cardiac cath    a. LHC 5/17 - normal coronary arteries. PA 45/25, mean 33, PCWP mean 18  . NICM (nonischemic cardiomyopathy) ([place])   . Stroke ([place]) 09/27/2017   "was weak on my left side; I'm fully recovered" (11/09/2017)  . Substance abuse ([place])    cocaine, marijuana     Assessment: 89 yoM with end-stage cardiomyopathy admitted with acute CHF. Pt on warfarin PTA for hx of apical thrombus on ECHO 12/2018. Pt resumed on home warfarin regimen, INR has remained therapeutic. LVAD workup ongoing, so pharmacy will assume dosing.  *PTA dose = 5mg  daily except 2.5mg  Tues  Goal of Therapy:  INR 2-3 Monitor platelets by anticoagulation protocol: Yes   Plan:  -Warfarin 5mg  daily except 2.5mg  Tues -Daily protime   Arrie Senate, PharmD, BCPS Clinical Pharmacist [phone] Please check AMION for all [place] numbers 06/14/2019

## 2019-06-14 NOTE — Progress Notes (Signed)
Initial Nutrition Assessment  DOCUMENTATION CODES:   Non-severe (moderate) malnutrition in context of chronic illness  INTERVENTION:   -Double protein portions with meals -MVI with minerals daily -Ensure Enlive po BID, each supplement provides 350 kcal and 20 grams of protein  NUTRITION DIAGNOSIS:   Moderate Malnutrition related to chronic illness(non-ischemic cardiomyopathy) as evidenced by mild fat depletion, moderate fat depletion, mild muscle depletion, moderate muscle depletion.  GOAL:   Patient will meet greater than or equal to 90% of their needs  MONITOR:   Supplement acceptance, PO intake, Labs, Weight trends, Skin, I & O's  REASON FOR ASSESSMENT:   Consult LVAD Eval  ASSESSMENT:   Mark [name] is a 43 y.o. male with medical history significant of nonischemic cardiomyopathy with ejection fraction of 10 to 15%, MCA CVA in 2019, asthma, polysubstance abuse, medication noncompliance presents to emergency department due to productive cough, subjective fever, generalized body aches since 2 weeks.  Patient reports productive cough with greenish sputum and sometimes he sees blood in it, associated with subjective fever, shortness of breath, wheezing, chest pain with cough, body ache, decreased appetite, generalized weakness and fatigue.  Pt admitted with rt lower lobe pneumonia.   Reviewed I/O's: -3.2 L x 24 hours and -3.5 L since admission  UOP: 5.3 L x 24 hours  Case discussed with RN, who reports pt with great appetite. He recently ordered a second breakfast tray, as first tray was cold after returning to room from several tests.   Spoke with pt at bedside, who was pleasant, but frustrated over situation. He reports concern over significant weight loss over the past year, which he reports "is all due to that lasix". Pt shares that his UBW is around 195# and was a muscular build prior to being diagnosed with heart disease. He shares that he has experienced a general  decline in health over the past year, due to multiple hospitalizations and complications related to his heart disease and treatments.   Pt shares he has a ravenous appetite and "eats all of the time". He consumes at least 3 hearty meals per day ("my appetite is no issue"). Meal completion 50-100%; breakfast tray arrived during visit, which this RD helped pt set up. Pt estimates he has about 40-50# within the past year. Per documented wt hx; wt has been stable in the past 3 months. Pt reports he used to be much larger and muscular in stature; he showed this RD photos of him in 2017 and 2019, where there was a significant physical difference in appearance compared form photos to current appearance (pt now with multiple fat and muscle depletions).   Discussed with pt importance of good meal and supplement intake to promote healing. Educated pt about importance of continued daily weight monitoring due to fluid retention. Additionally, discussed increased calories needs due to disease state and ways to increase calories and protein in diet.   Pt shares he is thinking about LVAD and hopeful he may be a candidate. He reports his father has an LVAD, which he received several years ago at [facility]. Per cardiology notes, pt has refused home inotropes. Pt expresses importance of a good quality of life; his most important assets are his two children (son BJ age 61 and daughter Forever age 93). RD provided emotional support.   Labs reviewed: Na: 133.   NUTRITION - FOCUSED PHYSICAL EXAM:    Most Recent Value  Orbital Region  Moderate depletion  Upper Arm Region  Moderate depletion  Thoracic  and Lumbar Region  Mild depletion  Buccal Region  Mild depletion  Temple Region  Moderate depletion  Clavicle Bone Region  Moderate depletion  Clavicle and Acromion Bone Region  Moderate depletion  Scapular Bone Region  Mild depletion  Dorsal Hand  Mild depletion  Patellar Region  Moderate depletion  Anterior Thigh Region   Moderate depletion  Posterior Calf Region  Moderate depletion  Edema (RD Assessment)  Mild  Hair  Reviewed  Eyes  Reviewed  Mouth  Reviewed  Skin  Reviewed  Nails  Reviewed       Diet Order:   Diet Order            Diet Heart Room service appropriate? Yes; Fluid consistency: Thin  Diet effective now              EDUCATION NEEDS:   Education needs have been addressed  Skin:  Skin Assessment: Reviewed RN Assessment  Last BM:  06/13/19  Height:   Ht Readings from Last 1 Encounters:  06/10/19 5\' 9"  (1.753 m)    Weight:   Wt Readings from Last 1 Encounters:  06/14/19 65.1 kg    Ideal Body Weight:  72.7 kg  BMI:  Body mass index is 21.19 kg/m.  Estimated Nutritional Needs:   Kcal:  2100-2300  Protein:  115-130 grams  Fluid:  > 2.1 L    Tazia Illescas A. Jimmye Norman, RD, LDN, [place] Registered Dietitian II Certified Diabetes Care and Education Specialist Pager: [phone] After hours Pager: [phone]

## 2019-06-14 NOTE — Progress Notes (Addendum)
Advanced Heart Failure Rounding Note  PCP-Cardiologist: Verne Carrowhristopher McAlhany, MD   Subjective:    Yesterday milrinone was increased to 0.375 mcg. CO-OX 47.5%. Diuresing with IV lasix. Weight trending down 148-->143.   Feeling a little better. Denies SOB.     Objective:   Weight Range: 65.1 kg Body mass index is 21.19 kg/m.   Vital Signs:   Temp:  [97.5 F (36.4 C)-98.9 F (37.2 C)] 98.3 F (36.8 C) (10/23 1100) Pulse Rate:  [97-108] 101 (10/23 1100) Resp:  [16-22] 20 (10/23 1100) BP: (97-147)/(69-90) 147/81 (10/23 1100) SpO2:  [100 %] 100 % (10/23 1100) Weight:  [65.1 kg] 65.1 kg (10/23 0616) Last BM Date: 06/13/19  Weight change: Filed Weights   06/12/19 0436 06/13/19 0538 06/14/19 0616  Weight: 67.5 kg 67 kg 65.1 kg    Intake/Output:   Intake/Output Summary (Last 24 hours) at 06/14/2019 1506 Last data filed at 06/14/2019 1427 Gross per 24 hour  Intake 1306.85 ml  Output 5050 ml  Net -3743.15 ml      Physical Exam  CVP 13.  General:  Well appearing. No resp difficulty HEENT: normal Neck: supple. JVP 12-13 . Carotids 2+ bilat; no bruits. No lymphadenopathy or thryomegaly appreciated. Cor: PMI laterally displaced. Regular rate & rhythm. No rubs,  or murmurs. +S3  Lungs: clear Abdomen: soft, nontender, nondistended. No hepatosplenomegaly. No bruits or masses. Good bowel sounds. Extremities: no cyanosis, clubbing, rash, edema. RUE PICC line  Neuro: alert & orientedx3, cranial nerves grossly intact. moves all 4 extremities w/o difficulty. Affect pleasant   Telemetry  SR-ST 90-100s personally reviewed.    EKG    N/A  Labs    CBC Recent Labs    06/12/19 0448 06/13/19 0500  WBC 5.8 4.7  HGB 11.1* 10.0*  HCT 35.1* 31.8*  MCV 80.3 81.5  PLT 230 199   Basic Metabolic Panel Recent Labs    78/29/5610/22/20 0500 06/14/19 0432  NA 132* 133*  K 4.1 3.8  CL 96* 94*  CO2 27 32  GLUCOSE 98 110*  BUN 21* 17  CREATININE 1.06 1.04  CALCIUM 8.7* 8.9    Liver Function Tests No results for input(s): AST, ALT, ALKPHOS, BILITOT, PROT, ALBUMIN in the last 72 hours. No results for input(s): LIPASE, AMYLASE in the last 72 hours. Cardiac Enzymes No results for input(s): CKTOTAL, CKMB, CKMBINDEX, TROPONINI in the last 72 hours.  BNP: BNP (last 3 results) Recent Labs    02/13/19 2243 05/08/19 2018 06/09/19 1140  BNP 3,902.9* 3,017.6* 2,175.3*    ProBNP (last 3 results) No results for input(s): PROBNP in the last 8760 hours.   D-Dimer No results for input(s): DDIMER in the last 72 hours. Hemoglobin A1C No results for input(s): HGBA1C in the last 72 hours. Fasting Lipid Panel No results for input(s): CHOL, HDL, LDLCALC, TRIG, CHOLHDL, LDLDIRECT in the last 72 hours. Thyroid Function Tests No results for input(s): TSH, T4TOTAL, T3FREE, THYROIDAB in the last 72 hours.  Invalid input(s): FREET3  Other results:   Imaging    [place] Doppler Pre Vad  Result Date: 06/14/2019 PERIOPERATIVE VASCULAR EVALUATION Indications:      Pre vad. Risk Factors:     Prior CVA. Limitations:      patient talking Comparison Study: no prior Performing Technologist: Blanch MediaMegan Riddle RVS  Examination Guidelines: A complete evaluation includes B-mode imaging, spectral Doppler, color Doppler, and power Doppler as needed of all accessible portions of each vessel. Bilateral testing is considered an integral part of  a complete examination. Limited examinations for reoccurring indications may be performed as noted.  Right Carotid Findings: +----------+--------+--------+--------+------------+--------+             PSV cm/s EDV cm/s Stenosis Describe     Comments  +----------+--------+--------+--------+------------+--------+  CCA Prox   97       18                heterogenous           +----------+--------+--------+--------+------------+--------+  CCA Distal 101      20                heterogenous           +----------+--------+--------+--------+------------+--------+  ICA  Prox   52       22       1-39%    heterogenous           +----------+--------+--------+--------+------------+--------+  ICA Distal 73       30                                       +----------+--------+--------+--------+------------+--------+  ECA        86       21                                       +----------+--------+--------+--------+------------+--------+ Portions of this table do not appear on this page. +----------+--------+-------+--------+------------+             PSV cm/s EDV cms Describe Arm Pressure  +----------+--------+-------+--------+------------+  Subclavian 148                       107           +----------+--------+-------+--------+------------+ +---------+--------+--+--------+--+---------+  Vertebral PSV cm/s 47 EDV cm/s 10 Antegrade  +---------+--------+--+--------+--+---------+ Left Carotid Findings: +----------+--------+--------+--------+------------+--------+             PSV cm/s EDV cm/s Stenosis Describe     Comments  +----------+--------+--------+--------+------------+--------+  CCA Prox   118      22                heterogenous           +----------+--------+--------+--------+------------+--------+  CCA Distal 108      26                heterogenous           +----------+--------+--------+--------+------------+--------+  ICA Prox   78       27       1-39%    heterogenous           +----------+--------+--------+--------+------------+--------+  ICA Distal 75       33                                       +----------+--------+--------+--------+------------+--------+  ECA        94       22                                       +----------+--------+--------+--------+------------+--------+ +----------+--------+--------+--------+------------+  Subclavian PSV cm/s EDV cm/s Describe Arm Pressure  +----------+--------+--------+--------+------------+  107                                      +----------+--------+--------+--------+------------+ +---------+--------+--+--------+-+   Vertebral PSV cm/s 28 EDV cm/s 5  +---------+--------+--+--------+-+  ABI Findings: +--------+------------------+-----+---------+--------+  Right    Rt Pressure (mmHg) Index Waveform  Comment   +--------+------------------+-----+---------+--------+  Brachial 107                      triphasic           +--------+------------------+-----+---------+--------+  PTA      100                0.93                      +--------+------------------+-----+---------+--------+  DP       124                1.16                      +--------+------------------+-----+---------+--------+ +--------+------------------+-----+---------+--------------------+  Left     Lt Pressure (mmHg) Index Waveform  Comment               +--------+------------------+-----+---------+--------------------+  Brachial                          triphasic restricted extremity  +--------+------------------+-----+---------+--------------------+  PTA      104                0.97                                  +--------+------------------+-----+---------+--------------------+  DP       108                1.01                                  +--------+------------------+-----+---------+--------------------+ +-------+---------------+----------------+  ABI/TBI Today's ABI/TBI Previous ABI/TBI  +-------+---------------+----------------+  Right   1.16                              +-------+---------------+----------------+  Left    1.01                              +-------+---------------+----------------+  Summary: Right Carotid: Velocities in the right ICA are consistent with a 1-39% stenosis. Left Carotid: Velocities in the left ICA are consistent with a 1-39% stenosis. Vertebrals: Bilateral vertebral arteries demonstrate antegrade flow.  *See table(s) above for measurements and observations. Right ABI: Resting right ankle-brachial index is within normal range. No evidence of significant right lower extremity arterial disease. Left ABI: Resting left  ankle-brachial index is within normal range. No evidence of significant left lower extremity arterial disease.     Preliminary    [place] Lower Extremity Venous (dvt)  Result Date: 06/14/2019  Lower Venous Study Indications: Lvad evaluation.  Comparison Study: previous study dobe 01/03/16 Performing Technologist: Blanch MediaMegan Riddle RVS  Examination Guidelines: A complete evaluation includes B-mode imaging, spectral Doppler, color Doppler, and power Doppler as needed of all accessible portions of each  vessel. Bilateral testing is considered an integral part of a complete examination. Limited examinations for reoccurring indications may be performed as noted.  +---------+---------------+---------+-----------+----------+--------------+  RIGHT     Compressibility Phasicity Spontaneity Properties Thrombus Aging  +---------+---------------+---------+-----------+----------+--------------+  CFV       Full            Yes       Yes                                    +---------+---------------+---------+-----------+----------+--------------+  SFJ       Full                                                             +---------+---------------+---------+-----------+----------+--------------+  FV Prox   Full                                                             +---------+---------------+---------+-----------+----------+--------------+  FV Mid    Full                                                             +---------+---------------+---------+-----------+----------+--------------+  FV Distal Full                                                             +---------+---------------+---------+-----------+----------+--------------+  PFV       Full                                                             +---------+---------------+---------+-----------+----------+--------------+  POP       Full            Yes       Yes                                     +---------+---------------+---------+-----------+----------+--------------+  PTV       Full                                                             +---------+---------------+---------+-----------+----------+--------------+  PERO      Full                                                             +---------+---------------+---------+-----------+----------+--------------+   +---------+---------------+---------+-----------+----------+--------------+  LEFT      Compressibility Phasicity Spontaneity Properties Thrombus Aging  +---------+---------------+---------+-----------+----------+--------------+  CFV       Full            Yes       Yes                                    +---------+---------------+---------+-----------+----------+--------------+  SFJ       Full                                                             +---------+---------------+---------+-----------+----------+--------------+  FV Prox   Full                                                             +---------+---------------+---------+-----------+----------+--------------+  FV Mid    Full                                                             +---------+---------------+---------+-----------+----------+--------------+  FV Distal Full                                                             +---------+---------------+---------+-----------+----------+--------------+  PFV       Full                                                             +---------+---------------+---------+-----------+----------+--------------+  POP       Full            Yes       Yes                                    +---------+---------------+---------+-----------+----------+--------------+  PTV       Full                                                             +---------+---------------+---------+-----------+----------+--------------+  PERO      Full                                                              +---------+---------------+---------+-----------+----------+--------------+  Summary: Right: There is no evidence of deep vein thrombosis in the lower extremity. No cystic structure found in the popliteal fossa. Left: There is no evidence of deep vein thrombosis in the lower extremity. No cystic structure found in the popliteal fossa.  *See table(s) above for measurements and observations.    Preliminary      Medications:     Scheduled Medications:  aspirin EC  81 mg Oral Daily   Chlorhexidine Gluconate Cloth  6 each Topical Daily   digoxin  0.125 mg Oral Daily   furosemide  80 mg Intravenous BID   ipratropium  0.5 mg Nebulization BID   mometasone-formoterol  2 puff Inhalation BID   spironolactone  25 mg Oral Daily   warfarin  2.5 mg Oral Q Tue-1800   warfarin  5 mg Oral Once per day on Sun Mon Wed Thu Fri Sat    Infusions:  milrinone 0.375 mcg/kg/min (06/14/19 1427)    PRN Medications: acetaminophen, albuterol, diphenhydrAMINE, sodium chloride flush    Patient Profile   43 y/o male with 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 Has had several recent admits for cardiogenic shock supported with milrinone but has refused home inotropes. Now readmitted with RML PNA and recurrent low output HF and lactic acidosis. He is not candidate for advanced therapies due to severe RV dysfunction and substance abuse.   Assessment/Plan   1. Rt Middle Lobe PNA: COVID negative. Step Pneumo +. Blood cultures negative. Received IV Vanco and cefepime in ED>>changed to cefdinir + doxycycline.  - WBC normalized.  - Afebrile - Continue abx  2. Acute on Chronic Biventricular Heart Failure w/ Low Output: History of nonischemic cardiomyopathy. Lastecho  5/20 showed that EF is markedly low at 5-10% with LV apical thrombus. The RV is severely dysfunctional as well. RHC in 8/20 with low output and high filling pressures. He required inotropic support previous 2 admits (initial co-ox in the  30s>>improved to 63% w/ milrinone) but refuses home milrinone. Not a candidate for LVAD w/ severe RV failure and substance abuse.  -We discussed home inotropes but he continues to refuse. We have consulted palliative care to see today to discuss hospice.   -He has spoken to the Hospice team and has told them he is now interested in LVAD.  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 He has also previously refused VAD saying he would continue to do anything he wants to do and not be able to comply with what was needed to take care of a VAD. -Wants to proceed with VAD w/u. He understands  RV dysfunction amy be prohibitive but we will place a PICC and get him on milrinone and then repeat RHC and echo and see if RV is recruitable.  - CO-OX improved to 47.5% on milrinone 0.375 mcg.  -CVP 13. Continue IV lasix 80 mg three times a day.  - Renal function stable.  - Continue digoxin and spiro  - He interested in LVAD. VAD work up underway.   3. Apical Thrombus: Identified on ECHO 12/2018.  -on coumadin. INR 2.9 today . No bleeding. Discussed dosing with PharmD personally.   4. H/O MCA CVA 2019 :  - Continue atorvastatin 40 mg daily. - Continue Coumadin + aspirin 81 mg daily.  - INR therapeutic at 2.9   5. AKI: Scr 1.30 on admit. (baseline ~1.0) - suspect 2/2 low output HF and hypotension  - previously required milrinone last 2 admits but refuses home ionotropes -Creatinine stable 1.0  6. CODE STATUS: Full code for now. See discussion above  Length of Stay: 5  Amy Clegg, NP  06/14/2019, 3:06 PM  Advanced Heart Failure Team Pager [phone] (M-F; 7a - 4p)  Please contact [facility] for night-coverage after hours (4p -7a ) and weekends on [email or web address]   Patient seen and examined with the above-signed Advanced Practice Provider and/or Housestaff. I personally reviewed laboratory data, imaging studies and relevant notes. I independently examined the patient and formulated the important  aspects of the plan. I have edited the note to reflect any of my changes or salient points. I have personally discussed the plan with the patient and/or family.  On IV milrinone at 0.375. Co-ox improved but still low. Volume status improving. Creatinine improving. Will continue IV diuresis. Now being more complaint with RN staff. INR therapeutic at 2.9.  He has been seen by VAD team and I discussed with them. Concerns remain over RV function and social issues.   Will plan RHC on Monday to reassess and then discuss at [place] on Monday.   Glori Bickers, MD  4:59 PM

## 2019-06-14 NOTE — Progress Notes (Signed)
Spoke with Mr Bastin at length regarding LVAD work up. I had my initial discussion with him back in August (see note.) I met with him again today to confirm that he would like to continue work up for possible LVAD. He expressed that he "thought you guys said I was going to get this pump back in August and that didn't happen."  Explained that work up must be completed in order to evaluate him for LVAD candidacy. I was very clear during our conversation that completing the evaluation process for LVAD is in no way a promise that LVAD will be offered to him. He verbalized understanding that further tests and consults need to be completed in order for the heart failure/LVAD team to discuss LVAD candidacy. Tests/labs/consult orders placed.   Emerson Monte RN [place] Coordinator  Office: [phone]  24/7 Pager: [phone]

## 2019-06-14 NOTE — Progress Notes (Signed)
ABI and pre vad evaluation has been completed.   Preliminary results in CV Proc.   [name] 06/14/2019 10:03 AM

## 2019-06-14 NOTE — Progress Notes (Signed)
PROGRESS NOTE    Mark [name]  ZOX:[id number] DOB: 28-Aug-1975 DOA: 06/09/2019 PCP: Grayce Sessions, NP     Brief Narrative:  Mark [name] is a 43 year old male with past medical history of chronic systolic heart failure (EF 10 to 15% with left apical thrombus with severely diffuse hypokinesis 12/2018) NICM by cardiac cath in 2017, history of right MCA CVA 2019, recurrent cardiogenic shock, asthma, polysubstance abuse who presented to the ED on 10/18 with complaints of productive cough, fever and body aches x2 weeks and found to have WBC 13 with chest x-ray with right middle lobe pneumonia, Covid negative and on admission his weight was up 13 pounds from previous discharge.  He was started on Vanco/cefepime in ED and changed to Rocephin/azithromycin for CAP therapy and continued on remaining home meds.  Overnight 10/18-10/19 a rapid response for hypotension was called after patient received his home BP meds and patient received 250 cc bolus NS with slightly improved BP however again had drop in blood pressure.  He received albumin x2 for recurrent hypotension throughout the same day after his blood pressure meds were held.  He remained hypotensive but asymptomatic.  Cardiology was consulted who deemed patient not a candidate for advanced heart failure therapies due to RV dysfunction substance abuse and palliative care was consulted.  Patient was concerned about having an adverse reaction to azithromycin and was changed to doxycycline/Rocephin for CAP coverage, then eventually deescalated to cefdinir, doxycycline.   New events last 24 hours / Subjective: Complains of some cramping due to increase in IV Lasix yesterday.  Denies chest pain or worsening shortness of breath this morning.  Assessment & Plan:   Principal Problem:   Right middle lobe pneumonia Active Problems:   Tobacco abuse   Asthma   Chronic systolic heart failure (HCC)   NICM (nonischemic cardiomyopathy) (HCC)   History of  right MCA stroke   Acute on chronic systolic (congestive) heart failure (HCC)   Polysubstance abuse (HCC)   Chronic hyponatremia    Strep pneumonia CAP in setting of right middle lobe pneumonia  -Positive S pneumo urinary antigen, blood cultures with no growth to date, Covid negative.  Completed antibiotics 10/18-10/22 for total 5 day course.   Acute on chronic biventricular heart failure with low output  -History of nonischemic cardiomyopathy.  EF 5 to 10% in May 2020, RV severely dysfunctional.  Had RHC in August with low output and high filling pressure and required inotropic support, previously on milrinone while hospitalized but refused home milrinone.  Not a candidate for LVAD with right heart failure.  BNP elevated but at/below baseline.  Weight up but asymptomatic.  Ran out of spironolactone 2 days prior to admit.  Cardiology consulted -Continue to hold Losartan -Continue digoxin, spironolactone, lasix IV, milrinone gtt  -Evaluation for VAD in process  Hypotension  -Improved  Apical thrombus  -Continue Coumadin   AKI  -Resolved  Chronic hyponatremia secondary to chronic heart failure  -Asymptomatic, stable. Monitor  Asthma  -Well controlled. Monitor  Polysubstance abuse  -UDS negative, and history of cocaine in the past  MCA CVA 2019  -No residual weakness.   Continue Coumadin, aspirin  Tobacco  -Cessation counseling     DVT prophylaxis: Coumadin Code Status: Full Family Communication: None at bedside Disposition Plan: Pending further heart failure team recommendations. On milrinone gtt currently.    Consultants:   Advanced heart failure  Palliative care   Procedures:   None   Antimicrobials:  Anti-infectives (From  admission, onward)   Start     Dose/Rate Route Frequency Ordered Stop   06/11/19 1000  cefdinir (OMNICEF) capsule 300 mg  Status:  Discontinued    Note to Pharmacy: Has been tolerating cephalosporins   300 mg Oral Every 12  hours 06/11/19 0804 06/11/19 0804   06/11/19 1000  cefdinir (OMNICEF) capsule 300 mg    Note to Pharmacy: Has been tolerating cephalosporins   300 mg Oral Every 12 hours 06/11/19 0804 06/13/19 2135   06/10/19 2200  doxycycline (VIBRA-TABS) tablet 100 mg     100 mg Oral Every 12 hours 06/10/19 1607 06/13/19 2135   06/10/19 0500  vancomycin (VANCOCIN) IVPB 750 mg/150 ml premix  Status:  Discontinued     750 mg 150 mL/hr over 60 Minutes Intravenous Every 12 hours 06/09/19 1402 06/09/19 1629   06/09/19 2300  ceFEPIme (MAXIPIME) 2 g in sodium chloride 0.9 % 100 mL IVPB  Status:  Discontinued     2 g 200 mL/hr over 30 Minutes Intravenous Every 8 hours 06/09/19 1400 06/09/19 1629   06/09/19 2300  cefTRIAXone (ROCEPHIN) 1 g in sodium chloride 0.9 % 100 mL IVPB  Status:  Discontinued     1 g 200 mL/hr over 30 Minutes Intravenous Every 24 hours 06/09/19 1628 06/11/19 0804   06/09/19 1630  azithromycin (ZITHROMAX) 500 mg in sodium chloride 0.9 % 250 mL IVPB  Status:  Discontinued     500 mg 250 mL/hr over 60 Minutes Intravenous Every 24 hours 06/09/19 1628 06/10/19 1607   06/09/19 1400  ceFEPIme (MAXIPIME) 2 g in sodium chloride 0.9 % 100 mL IVPB     2 g 200 mL/hr over 30 Minutes Intravenous  Once 06/09/19 1359 06/09/19 1521   06/09/19 1400  vancomycin (VANCOCIN) 1,250 mg in sodium chloride 0.9 % 250 mL IVPB  Status:  Discontinued     1,250 mg 166.7 mL/hr over 90 Minutes Intravenous  Once 06/09/19 1359 06/09/19 1700       Objective: Vitals:   06/13/19 2100 06/14/19 0040 06/14/19 0446 06/14/19 0616  BP: 102/73  97/69   Pulse: 100 97 (!) 108   Resp: 18 (!) 22 16   Temp: 98.9 F (37.2 C)  (!) 97.5 F (36.4 C)   TempSrc: Oral  Oral   SpO2: 100% 100% 100%   Weight:    65.1 kg  Height:        Intake/Output Summary (Last 24 hours) at 06/14/2019 1109 Last data filed at 06/14/2019 1000 Gross per 24 hour  Intake 1775.77 ml  Output 4475 ml  Net -2699.23 ml   Filed Weights   06/12/19 0436  06/13/19 0538 06/14/19 0616  Weight: 67.5 kg 67 kg 65.1 kg    Examination: General exam: Appears calm and comfortable  Respiratory system: Diminished breath sounds bilaterally.  No respiratory distress.  Respiratory effort normal. Cardiovascular system: S1 & S2 heard, guarded, regular rhythm.  Heart rate 103. No pedal edema. Gastrointestinal system: Abdomen is nondistended, soft and nontender. Normal bowel sounds heard. Central nervous system: Alert and oriented. Non focal exam. Speech clear  Extremities: Symmetric in appearance bilaterally  Skin: No rashes, lesions or ulcers on exposed skin  Psychiatry: Judgement and insight appear stable. Mood & affect appropriate.     Data Reviewed: I have personally reviewed following labs and imaging studies  CBC: Recent Labs  Lab 06/09/19 1140 06/10/19 0038 06/11/19 0254 06/12/19 0448 06/13/19 0500  WBC 13.1* 12.3* 6.2 5.8 4.7  NEUTROABS  --  9.8*  --   --   --   HGB 12.8* 11.2* 11.0* 11.1* 10.0*  HCT 39.4 34.9* 33.2* 35.1* 31.8*  MCV 80.2 81.2 79.6* 80.3 81.5  PLT 196 176 206 230 199   Basic Metabolic Panel: Recent Labs  Lab 06/09/19 1140 06/10/19 0038 06/11/19 0254 06/12/19 0448 06/13/19 0500 06/14/19 0432  NA 129* 130* 130* 132* 132* 133*  K 5.1 4.1 3.9 3.8 4.1 3.8  CL 93* 94* 96* 98 96* 94*  CO2 24 25 24 24 27  32  GLUCOSE 107* 127* 97 110* 98 110*  BUN 21* 24* 22* 24* 21* 17  CREATININE 1.30* 1.52* 1.09 1.21 1.06 1.04  CALCIUM 8.8* 8.0* 8.6* 8.8* 8.7* 8.9  MG 1.6*  --   --   --   --   --   PHOS 2.6  --   --   --   --   --    GFR: Estimated Creatinine Clearance: 82.6 mL/min (by C-G formula based on SCr of 1.04 mg/dL). Liver Function Tests: Recent Labs  Lab 06/10/19 0038  AST 35  ALT 19  ALKPHOS 75  BILITOT 2.5*  PROT 6.1*  ALBUMIN 2.5*   No results for input(s): LIPASE, AMYLASE in the last 168 hours. No results for input(s): AMMONIA in the last 168 hours. Coagulation Profile: Recent Labs  Lab 06/10/19  0038 06/11/19 0254 06/12/19 0448 06/13/19 0500 06/14/19 0432  INR 2.6* 3.1* 2.7* 2.9* 2.9*   Cardiac Enzymes: No results for input(s): CKTOTAL, CKMB, CKMBINDEX, TROPONINI in the last 168 hours. BNP (last 3 results) No results for input(s): PROBNP in the last 8760 hours. HbA1C: No results for input(s): HGBA1C in the last 72 hours. CBG: Recent Labs  Lab 06/10/19 1153  GLUCAP 148*   Lipid Profile: No results for input(s): CHOL, HDL, LDLCALC, TRIG, CHOLHDL, LDLDIRECT in the last 72 hours. Thyroid Function Tests: No results for input(s): TSH, T4TOTAL, FREET4, T3FREE, THYROIDAB in the last 72 hours. Anemia Panel: No results for input(s): VITAMINB12, FOLATE, FERRITIN, TIBC, IRON, RETICCTPCT in the last 72 hours. Sepsis Labs: Recent Labs  Lab 06/09/19 1140 06/09/19 1540 06/10/19 0038 06/10/19 1110 06/10/19 1417  PROCALCITON 5.97  --   --   --   --   LATICACIDVEN  --  1.6 2.6* 1.4 1.4    Recent Results (from the past 240 hour(s))  SARS CORONAVIRUS 2 (TAT 6-24 HRS) Nasopharyngeal Nasopharyngeal Swab     Status: None   Collection Time: 06/09/19  1:30 PM   Specimen: Nasopharyngeal Swab  Result Value Ref Range Status   SARS Coronavirus 2 NEGATIVE NEGATIVE Final    Comment: (NOTE) SARS-CoV-2 target nucleic acids are NOT DETECTED. The SARS-CoV-2 RNA is generally detectable in upper and lower respiratory specimens during the acute phase of infection. Negative results do not preclude SARS-CoV-2 infection, do not rule out co-infections with other pathogens, and should not be used as the sole basis for treatment or other patient management decisions. Negative results must be combined with clinical observations, patient history, and epidemiological information. The expected result is Negative. Fact Sheet for Patients: 06/11/19 Fact Sheet for Healthcare Providers: HairSlick.no This test is not yet approved or cleared  by the quierodirigir.com FDA and  has been authorized for detection and/or diagnosis of SARS-CoV-2 by FDA under an Emergency Use Authorization (EUA). This EUA will remain  in effect (meaning this test can be used) for the duration of the COVID-19 declaration under Section 56 4(b)(1) of the Act, 21 U.S.C.  section 360bbb-3(b)(1), unless the authorization is terminated or revoked sooner. Performed at [facility], [address] [place], [place] [address]   Culture, blood (routine x 2)     Status: None   Collection Time: 06/09/19  3:40 PM   Specimen: BLOOD LEFT FOREARM  Result Value Ref Range Status   Specimen Description BLOOD LEFT FOREARM  Final   Special Requests   Final    BOTTLES DRAWN AEROBIC AND ANAEROBIC Blood Culture adequate volume   Culture   Final    NO GROWTH 5 DAYS Performed at [facility], [address] [place], [place] [address]    Report Status 06/14/2019 FINAL  Final  Culture, blood (routine x 2)     Status: None   Collection Time: 06/09/19  4:38 PM   Specimen: BLOOD RIGHT HAND  Result Value Ref Range Status   Specimen Description BLOOD RIGHT HAND  Final   Special Requests   Final    BOTTLES DRAWN AEROBIC AND ANAEROBIC Blood Culture results may not be optimal due to an inadequate volume of blood received in culture bottles   Culture   Final    NO GROWTH 5 DAYS Performed at [facility], [address] [place], [place] [address]    Report Status 06/14/2019 FINAL  Final  Culture, sputum-assessment     Status: None   Collection Time: 06/10/19  6:55 PM   Specimen: Expectorated Sputum  Result Value Ref Range Status   Specimen Description EXPECTORATED SPUTUM  Final   Special Requests NONE  Final   Sputum evaluation   Final    THIS SPECIMEN IS ACCEPTABLE FOR SPUTUM CULTURE Performed at [facility], [address] [place], [place] [address]    Report Status 06/10/2019 FINAL  Final  MRSA PCR Screening     Status: None   Collection Time:  06/10/19  6:55 PM   Specimen: Nasal Mucosa; Nasopharyngeal  Result Value Ref Range Status   MRSA by PCR NEGATIVE NEGATIVE Final    Comment:        The GeneXpert MRSA Assay (FDA approved for NASAL specimens only), is one component of a comprehensive MRSA colonization surveillance program. It is not intended to diagnose MRSA infection nor to guide or monitor treatment for MRSA infections. Performed at [facility], [address] [place], [place] [address]   Culture, respiratory     Status: None   Collection Time: 06/10/19  6:55 PM  Result Value Ref Range Status   Specimen Description EXPECTORATED SPUTUM  Final   Special Requests NONE Reflexed from [id number]  Final   Gram Stain   Final    ABUNDANT WBC PRESENT,BOTH PMN AND MONONUCLEAR RARE GRAM VARIABLE ROD RARE GRAM POSITIVE COCCI Performed at [facility], [address] [place], [place] [address]    Culture RARE ESCHERICHIA COLI  Final   Report Status 06/13/2019 FINAL  Final   Organism ID, Bacteria ESCHERICHIA COLI  Final      Susceptibility   Escherichia coli - MIC*    AMPICILLIN >=32 RESISTANT Resistant     CEFAZOLIN >=64 RESISTANT Resistant     CEFEPIME <=1 SENSITIVE Sensitive     CEFTAZIDIME 4 SENSITIVE Sensitive     CEFTRIAXONE 8 SENSITIVE Sensitive     CIPROFLOXACIN <=0.25 SENSITIVE Sensitive     GENTAMICIN <=1 SENSITIVE Sensitive     IMIPENEM <=0.25 SENSITIVE Sensitive     TRIMETH/SULFA <=20 SENSITIVE Sensitive  AMPICILLIN/SULBACTAM >=32 RESISTANT Resistant     PIP/TAZO <=4 SENSITIVE Sensitive     Extended ESBL NEGATIVE Sensitive     * RARE ESCHERICHIA COLI      Radiology Studies: Vas US Doppler Pre Vad  Result Date: 06/14/2019 PERIOPERATIVE VASCULAR EVALUATION Indications:      Pre vad. Risk Factors:     Prior CVA. Limitations:      patient talking Comparison Study: no prior Performing Technologist: Blanch Media RVS  Examination Guidelines: A complete evaluation includes B-mode imaging,  spectral Doppler, color Doppler, and power Doppler as needed of all accessible portions of each vessel. Bilateral testing is considered an integral part of a complete examination. Limited examinations for reoccurring indications may be performed as noted.  Right Carotid Findings: +----------+--------+--------+--------+------------+--------+           PSV cm/sEDV cm/sStenosisDescribe    Comments +----------+--------+--------+--------+------------+--------+ CCA Prox  97      18              heterogenous         +----------+--------+--------+--------+------------+--------+ CCA Distal101     20              heterogenous         +----------+--------+--------+--------+------------+--------+ ICA Prox  52      22      1-39%   heterogenous         +----------+--------+--------+--------+------------+--------+ ICA Distal73      30                                   +----------+--------+--------+--------+------------+--------+ ECA       86      21                                   +----------+--------+--------+--------+------------+--------+ Portions of this table do not appear on this page. +----------+--------+-------+--------+------------+           PSV cm/sEDV cmsDescribeArm Pressure +----------+--------+-------+--------+------------+ Subclavian148                    107          +----------+--------+-------+--------+------------+ +---------+--------+--+--------+--+---------+ VertebralPSV cm/s47EDV cm/s10Antegrade +---------+--------+--+--------+--+---------+ Left Carotid Findings: +----------+--------+--------+--------+------------+--------+           PSV cm/sEDV cm/sStenosisDescribe    Comments +----------+--------+--------+--------+------------+--------+ CCA Prox  118     22              heterogenous         +----------+--------+--------+--------+------------+--------+ CCA Distal108     26              heterogenous          +----------+--------+--------+--------+------------+--------+ ICA Prox  78      27      1-39%   heterogenous         +----------+--------+--------+--------+------------+--------+ ICA Distal75      33                                   +----------+--------+--------+--------+------------+--------+ ECA       94      22                                   +----------+--------+--------+--------+------------+--------+ +----------+--------+--------+--------+------------+  SubclavianPSV cm/sEDV cm/sDescribeArm Pressure +----------+--------+--------+--------+------------+           107                                  +----------+--------+--------+--------+------------+ +---------+--------+--+--------+-+ VertebralPSV cm/s28EDV cm/s5 +---------+--------+--+--------+-+  ABI Findings: +--------+------------------+-----+---------+--------+ Right   Rt Pressure (mmHg)IndexWaveform Comment  +--------+------------------+-----+---------+--------+ [id number]                    triphasic         +--------+------------------+-----+---------+--------+ PTA     100               0.93                   +--------+------------------+-----+---------+--------+ DP      124               1.16                   +--------+------------------+-----+---------+--------+ +--------+------------------+-----+---------+--------------------+ Left    Lt Pressure (mmHg)IndexWaveform Comment              +--------+------------------+-----+---------+--------------------+ Brachial                       triphasicrestricted extremity +--------+------------------+-----+---------+--------------------+ PTA     104               0.97                               +--------+------------------+-----+---------+--------------------+ DP      108               1.01                               +--------+------------------+-----+---------+--------------------+  +-------+---------------+----------------+ ABI/TBIToday's ABI/TBIPrevious ABI/TBI +-------+---------------+----------------+ Right  1.16                            +-------+---------------+----------------+ Left   1.01                            +-------+---------------+----------------+  Summary: Right Carotid: Velocities in the right ICA are consistent with a 1-39% stenosis. Left Carotid: Velocities in the left ICA are consistent with a 1-39% stenosis. Vertebrals: Bilateral vertebral arteries demonstrate antegrade flow.  *See table(s) above for measurements and observations. Right ABI: Resting right ankle-brachial index is within normal range. No evidence of significant right lower extremity arterial disease. Left ABI: Resting left ankle-brachial index is within normal range. No evidence of significant left lower extremity arterial disease.     Preliminary    Vas [place] Lower Extremity Venous (dvt)  Result Date: 06/14/2019  Lower Venous Study Indications: Lvad evaluation.  Comparison Study: previous study dobe 01/03/16 Performing Technologist: Blanch Media RVS  Examination Guidelines: A complete evaluation includes B-mode imaging, spectral Doppler, color Doppler, and power Doppler as needed of all accessible portions of each vessel. Bilateral testing is considered an integral part of a complete examination. Limited examinations for reoccurring indications may be performed as noted.  +---------+---------------+---------+-----------+----------+--------------+ RIGHT    CompressibilityPhasicitySpontaneityPropertiesThrombus Aging +---------+---------------+---------+-----------+----------+--------------+ CFV      Full           Yes      Yes                                 +---------+---------------+---------+-----------+----------+--------------+  SFJ      Full                                                         +---------+---------------+---------+-----------+----------+--------------+ FV Prox  Full                                                        +---------+---------------+---------+-----------+----------+--------------+ FV Mid   Full                                                        +---------+---------------+---------+-----------+----------+--------------+ FV DistalFull                                                        +---------+---------------+---------+-----------+----------+--------------+ PFV      Full                                                        +---------+---------------+---------+-----------+----------+--------------+ POP      Full           Yes      Yes                                 +---------+---------------+---------+-----------+----------+--------------+ PTV      Full                                                        +---------+---------------+---------+-----------+----------+--------------+ PERO     Full                                                        +---------+---------------+---------+-----------+----------+--------------+   +---------+---------------+---------+-----------+----------+--------------+ LEFT     CompressibilityPhasicitySpontaneityPropertiesThrombus Aging +---------+---------------+---------+-----------+----------+--------------+ CFV      Full           Yes      Yes                                 +---------+---------------+---------+-----------+----------+--------------+ SFJ      Full                                                        +---------+---------------+---------+-----------+----------+--------------+  FV Prox  Full                                                        +---------+---------------+---------+-----------+----------+--------------+ FV Mid   Full                                                         +---------+---------------+---------+-----------+----------+--------------+ FV DistalFull                                                        +---------+---------------+---------+-----------+----------+--------------+ PFV      Full                                                        +---------+---------------+---------+-----------+----------+--------------+ POP      Full           Yes      Yes                                 +---------+---------------+---------+-----------+----------+--------------+ PTV      Full                                                        +---------+---------------+---------+-----------+----------+--------------+ PERO     Full                                                        +---------+---------------+---------+-----------+----------+--------------+     Summary: Right: There is no evidence of deep vein thrombosis in the lower extremity. No cystic structure found in the popliteal fossa. Left: There is no evidence of deep vein thrombosis in the lower extremity. No cystic structure found in the popliteal fossa.  *See table(s) above for measurements and observations.    Preliminary    [place] Ekg Site Rite  Result Date: 06/12/2019 If Site Rite image not attached, placement could not be confirmed due to current cardiac rhythm.     Scheduled Meds: . aspirin EC  81 mg Oral Daily  . Chlorhexidine Gluconate Cloth  6 each Topical Daily  . digoxin  0.125 mg Oral Daily  . furosemide  80 mg Intravenous BID  . ipratropium  0.5 mg Nebulization BID  . mometasone-formoterol  2 puff Inhalation BID  . spironolactone  25 mg Oral Daily  . warfarin  2.5 mg Oral Q Tue-1800  . warfarin  5 mg Oral Once per day on Sun Mon Wed Thu Fri  Sat   Continuous Infusions: . milrinone 0.375 mcg/kg/min (06/14/19 0048)     LOS: 5 days      Time spent: 25 minutes   Noralee StainJennifer Mani Celestin, DO [facility] 06/14/2019, 11:09 AM   Available via Epic secure  chat 7am-7pm After these hours, please refer to coverage provider listed on [email or web address]

## 2019-06-15 LAB — BASIC METABOLIC PANEL
Anion gap: 11 (ref 5–15)
BUN: 15 mg/dL (ref 6–20)
CO2: 29 mmol/L (ref 22–32)
Calcium: 8.9 mg/dL (ref 8.9–10.3)
Chloride: 95 mmol/L — ABNORMAL LOW (ref 98–111)
Creatinine, Ser: 1.21 mg/dL (ref 0.61–1.24)
GFR calc Af Amer: >60 mL/min (ref >60)
GFR calc non Af Amer: >60 mL/min (ref >60)
Glucose, Bld: 109 mg/dL — ABNORMAL HIGH (ref 70–99)
Potassium: 3.7 mmol/L (ref 3.5–5.1)
Sodium: 135 mmol/L (ref 135–145)

## 2019-06-15 LAB — CBC
HCT: 32.3 % — ABNORMAL LOW (ref 39.0–52.0)
Hemoglobin: 10.2 g/dL — ABNORMAL LOW (ref 13.0–17.0)
MCH: 25.5 pg — ABNORMAL LOW (ref 26.0–34.0)
MCHC: 31.6 g/dL (ref 30.0–36.0)
MCV: 80.8 fL (ref 80.0–100.0)
Platelets: 251 10*3/uL (ref 150–400)
RBC: 4 MIL/uL — ABNORMAL LOW (ref 4.22–5.81)
RDW: 16.7 % — ABNORMAL HIGH (ref 11.5–15.5)
WBC: 5.2 10*3/uL (ref 4.0–10.5)
nRBC: 0 % (ref 0.0–0.2)

## 2019-06-15 LAB — COOXEMETRY PANEL
Carboxyhemoglobin: 1.5 % (ref 0.5–1.5)
Methemoglobin: 1 % (ref 0.0–1.5)
O2 Saturation: 53.9 %
Total hemoglobin: 11.1 g/dL — ABNORMAL LOW (ref 12.0–16.0)

## 2019-06-15 LAB — PROTIME-INR
INR: 2.9 — ABNORMAL HIGH (ref 0.8–1.2)
Prothrombin Time: 29.6 seconds — ABNORMAL HIGH (ref 11.4–15.2)

## 2019-06-15 MED ORDER — WARFARIN - PHARMACIST DOSING INPATIENT
Freq: Every day | Status: DC
  Administered 2019-06-15 – 2019-06-16 (×2)

## 2019-06-15 NOTE — Progress Notes (Signed)
ANTICOAGULATION CONSULT NOTE - [place] for warfarin Indication: apical thrombus  Allergies  Allergen Reactions  . Clindamycin/Lincomycin Swelling and Palpitations  . Hydrocodone Hives  . Lisinopril Swelling and Other (See Comments)    Facial swelling/angioedema  . Olopatadine Hcl Swelling    Makes his face swell up  . Prednisone Shortness Of Breath, Nausea Only, Swelling and Other (See Comments)    Also made chest feel tight and genital area, legs, and face became swollen badly  . Lasix [Furosemide] Hives and Swelling    Facial swelling Reaction to name brand LASIX  . Penicillins Hives and Swelling     Has patient had a PCN reaction causing immediate rash, facial/tongue/throat swelling, SOB or lightheadedness with hypotension: Yes Has patient had a PCN reaction causing severe rash involving mucus membranes or skin necrosis: No Has patient had a PCN reaction that required hospitalization: No Has patient had a PCN reaction occurring within the last 10 years: No If all of the above answers are "NO", then may proceed with Cephalosporin use.   . Bactrim [Sulfamethoxazole-Trimethoprim] Swelling and Rash    Facial swelling    Patient Measurements: Height: 5\' 9"  (175.3 cm) Weight: 139 lb 11.2 oz (63.4 kg) IBW/kg (Calculated) : 70.7  Vital Signs: Temp: 98.4 F (36.9 C) (10/24 0743) Temp Source: Oral (10/24 0743) BP: 107/74 (10/24 0921) Pulse Rate: 108 (10/24 0743)  Labs: Recent Labs    06/13/19 0500 06/14/19 0432 06/15/19 0421  HGB 10.0*  --  10.2*  HCT 31.8*  --  32.3*  PLT 199  --  251  LABPROT 29.8* 29.6* 29.6*  INR 2.9* 2.9* 2.9*  CREATININE 1.06 1.04 1.21    Estimated Creatinine Clearance: 69.1 mL/min (by C-G formula based on SCr of 1.21 mg/dL).   Medical History: Past Medical History:  Diagnosis Date  . Asthma   . Chronic systolic CHF (congestive heart failure) ([place])   . Cigarette smoker   . CKD (chronic kidney disease), stage  II    Archie Endo 10/01/2017  . COPD (chronic obstructive pulmonary disease) ([place]) 10/21/2017   on CT scan chest  . History of echocardiogram    a. Echo 5/17 - EF 20-25%, severe diff HK, restrictive physiology, mild to mod MR, severe reduced RVSF, mod RVE, mild RAE, mod TR, PASP 48 mmHg  . Hx of cardiac cath    a. LHC 5/17 - normal coronary arteries. PA 45/25, mean 33, PCWP mean 18  . NICM (nonischemic cardiomyopathy) ([place])   . Stroke ([place]) 09/27/2017   "was weak on my left side; I'm fully recovered" (11/09/2017)  . Substance abuse ([place])    cocaine, marijuana     Assessment: 26 yoM with end-stage cardiomyopathy admitted with acute CHF. Pt on warfarin PTA for hx of apical thrombus on ECHO 12/2018. Pt resumed on home warfarin regimen, INR has remained therapeutic. LVAD workup ongoing, so pharmacy will assume dosing.  *PTA dose = 5mg  daily except 2.5mg  Tues  Goal of Therapy:  INR 2-3 Monitor platelets by anticoagulation protocol: Yes   Plan:  -Warfarin 5mg  daily except 2.5mg  Tues -Daily PT/INR.  Marguerite Olea, [facility] Clinical Pharmacist Phone [phone]  06/15/2019 10:32 AM

## 2019-06-15 NOTE — Progress Notes (Signed)
Advanced Heart Failure Rounding Note  PCP-Cardiologist: Verne Carrow, MD   Subjective:    Remains on 0.375 mcg. CO-OX 47.5% -> 53.9%. Weight down another 4 pounds overnight  Complains of cramping. Refuses lasix this am due to cramping. Denies SOB, orthopnea or PND. Creatinine 1.0 -> 1.2. CVP 9-10    Objective:   Weight Range: 63.4 kg Body mass index is 20.63 kg/m.   Vital Signs:   Temp:  [98 F (36.7 C)-98.5 F (36.9 C)] 98.5 F (36.9 C) (10/24 1133) Pulse Rate:  [90-108] 106 (10/24 1133) Resp:  [16-20] 20 (10/24 1133) BP: (92-115)/(69-82) 103/78 (10/24 1133) SpO2:  [98 %-100 %] 100 % (10/24 1133) Weight:  [63.4 kg] 63.4 kg (10/24 0341) Last BM Date: 06/14/19  Weight change: Filed Weights   06/13/19 0538 06/14/19 0616 06/15/19 0341  Weight: 67 kg 65.1 kg 63.4 kg    Intake/Output:   Intake/Output Summary (Last 24 hours) at 06/15/2019 1437 Last data filed at 06/15/2019 1100 Gross per 24 hour  Intake 1340.63 ml  Output 2400 ml  Net -1059.37 ml      Physical Exam   General:  Sitting up in bed. No resp difficulty HEENT: normal Neck: supple.  CVP 9-10. Carotids 2+ bilat; no bruits. No lymphadenopathy or thryomegaly appreciated. Cor: PMI laterally displaced. Regular tachy . +s3 Lungs: clear Abdomen: soft, nontender, nondistended. No hepatosplenomegaly. No bruits or masses. Good bowel sounds. Extremities: no cyanosis, clubbing, rash, edema Neuro: alert & orientedx3, cranial nerves grossly intact. moves all 4 extremities w/o difficulty. Affect pleasant   Telemetry   ST 100-110 Personally reviewed  EKG    N/A  Labs    CBC Recent Labs    06/13/19 0500 06/15/19 0421  WBC 4.7 5.2  HGB 10.0* 10.2*  HCT 31.8* 32.3*  MCV 81.5 80.8  PLT 199 251   Basic Metabolic Panel Recent Labs    47/65/46 0432 06/15/19 0421  NA 133* 135  K 3.8 3.7  CL 94* 95*  CO2 32 29  GLUCOSE 110* 109*  BUN 17 15  CREATININE 1.04 1.21  CALCIUM 8.9 8.9    Liver Function Tests No results for input(s): AST, ALT, ALKPHOS, BILITOT, PROT, ALBUMIN in the last 72 hours. No results for input(s): LIPASE, AMYLASE in the last 72 hours. Cardiac Enzymes No results for input(s): CKTOTAL, CKMB, CKMBINDEX, TROPONINI in the last 72 hours.  BNP: BNP (last 3 results) Recent Labs    02/13/19 2243 05/08/19 2018 06/09/19 1140  BNP 3,902.9* 3,017.6* 2,175.3*    ProBNP (last 3 results) No results for input(s): PROBNP in the last 8760 hours.   D-Dimer No results for input(s): DDIMER in the last 72 hours. Hemoglobin A1C No results for input(s): HGBA1C in the last 72 hours. Fasting Lipid Panel No results for input(s): CHOL, HDL, LDLCALC, TRIG, CHOLHDL, LDLDIRECT in the last 72 hours. Thyroid Function Tests No results for input(s): TSH, T4TOTAL, T3FREE, THYROIDAB in the last 72 hours.  Invalid input(s): FREET3  Other results:   Imaging    No results found.   Medications:     Scheduled Medications: . aspirin EC  81 mg Oral Daily  . Chlorhexidine Gluconate Cloth  6 each Topical Daily  . digoxin  0.125 mg Oral Daily  . furosemide  80 mg Intravenous BID  . ipratropium  0.5 mg Nebulization BID  . mometasone-formoterol  2 puff Inhalation BID  . sodium chloride flush  3 mL Intravenous Q12H  . spironolactone  25 mg Oral  Daily  . warfarin  2.5 mg Oral Q Tue-1800  . warfarin  5 mg Oral Once per day on Sun Mon Wed Thu Fri Sat  . Warfarin - Pharmacist Dosing Inpatient   Does not apply q1800    Infusions: . milrinone 0.375 mcg/kg/min (06/15/19 0431)    PRN Medications: acetaminophen, albuterol, diphenhydrAMINE, sodium chloride flush    Patient Profile   43 y/o male with 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 Has had several recent admits for cardiogenic shock supported with milrinone but has refused home inotropes. Now readmitted with RML PNA and recurrent low output HF and lactic acidosis. He is not candidate for advanced therapies due  to severe RV dysfunction and substance abuse.   Assessment/Plan   1. Rt Middle Lobe PNA: COVID negative. Step Pneumo +. Blood cultures negative. Received IV Vanco and cefepime in ED>>changed to cefdinir + doxycycline.  - WBC normalized.  - Afebrile - On abx per primary team  2. Acute on Chronic Biventricular Heart Failure w/ Low Output: History of nonischemic cardiomyopathy. Lastecho  5/20 showed that EF is markedly low at 5-10% with LV apical thrombus. The RV is severely dysfunctional as well. RHC in 8/20 with low output and high filling pressures. He required inotropic support previous 2 admits (initial co-ox in the 30s>>improved to 63% w/ milrinone) but refuses home milrinone. Not a candidate for LVAD w/ severe RV failure and substance abuse.  -We discussed home inotropes but he continues to refuse. We have consulted palliative care to see today to discuss hospice.   -He has spoken to the Hospice team and has told them he is now interested in LVAD.  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 He has also previously refused VAD saying he would continue to do anything he wants to do and not be able to comply with what was needed to take care of a VAD. -Wants to proceed with VAD w/u. He understands  RV dysfunction amy be prohibitive but we will place a PICC and get him on milrinone and then repeat RHC and echo and see if RV is recruitable.  - Co-ox improved to 54% on milrinone 0.375 mcg.  - Volume status improving with IV lasix CVP 9. Will continue IV lasix one more dau - Renal function stable.  - Continue digoxin and spiro  - He interested in LVAD. VAD work up underway. - Plan RHC Monday   3. Apical Thrombus: Identified on ECHO 12/2018.  -on coumadin. INR stable at  today .  No bleeding. Discussed dosing with PharmD personally.   4. H/O MCA CVA 2019 :  - Continue atorvastatin 40 mg daily. - Continue Coumadin + aspirin 81 mg daily.  - INR therapeutic at 2.9   5. AKI: Scr 1.30 on  admit. (baseline ~1.0) - suspect 2/2 low output HF and hypotension  - previously required milrinone last 2 admits but refuses home ionotropes -Creatinine up slightly 1.0 -> 1.2 - Watch with diuresis  6. CODE STATUS: Full code for now. See discussion above  Length of Stay: [place], MD  06/15/2019, 2:37 PM  Advanced Heart Failure Team Pager [phone] (M-F; 7a - 4p)  Please contact [facility] for night-coverage after hours (4p -7a ) and weekends on [email or web address]

## 2019-06-15 NOTE — Progress Notes (Signed)
PROGRESS NOTE    PRYCE FOLTS  ZOX:[id number] DOB: 12-Nov-1975 DOA: 06/09/2019 PCP: Grayce Sessions, NP     Brief Narrative:  Mark [name] is a 43 year old male with past medical history of chronic systolic heart failure (EF 10 to 15% with left apical thrombus with severely diffuse hypokinesis 12/2018) NICM by cardiac cath in 2017, history of right MCA CVA 2019, recurrent cardiogenic shock, asthma, polysubstance abuse who presented to the ED on 10/18 with complaints of productive cough, fever and body aches x2 weeks and found to have WBC 13 with chest x-ray with right middle lobe pneumonia, Covid negative and on admission his weight was up 13 pounds from previous discharge.  He was started on Vanco/cefepime in ED and changed to Rocephin/azithromycin for CAP therapy and continued on remaining home meds.  Overnight 10/18-10/19 a rapid response for hypotension was called after patient received his home BP meds and patient received 250 cc bolus NS with slightly improved BP however again had drop in blood pressure.  He received albumin x2 for recurrent hypotension throughout the same day after his blood pressure meds were held.  He remained hypotensive but asymptomatic.  Cardiology was consulted who deemed patient not a candidate for advanced heart failure therapies due to RV dysfunction substance abuse and palliative care was consulted.  Patient was concerned about having an adverse reaction to azithromycin and was changed to doxycycline/Rocephin for CAP coverage, then eventually deescalated to cefdinir, doxycycline.   New events last 24 hours / Subjective: His main complaint is epigastric/right upper quadrant abdominal cramping as well as bilateral hand cramping due to IV Lasix.  He denies any shortness of breath.  Assessment & Plan:   Principal Problem:   Right middle lobe pneumonia Active Problems:   Tobacco abuse   Asthma   Chronic systolic heart failure (HCC)   NICM (nonischemic  cardiomyopathy) (HCC)   History of right MCA stroke   Acute on chronic systolic (congestive) heart failure (HCC)   Polysubstance abuse (HCC)   Chronic hyponatremia    Strep pneumonia CAP in setting of right middle lobe pneumonia  -Positive S pneumo urinary antigen, blood cultures with no growth to date, Covid negative.  Completed antibiotics 10/18-10/22 for total 5 day course.   Acute on chronic biventricular heart failure with low output  -History of nonischemic cardiomyopathy.  EF 5 to 10% in May 2020, RV severely dysfunctional.  Had RHC in August with low output and high filling pressure and required inotropic support, previously on milrinone while hospitalized but refused home milrinone.  Not a candidate for LVAD with right heart failure.  BNP elevated but at/below baseline.  Weight up but asymptomatic.  Ran out of spironolactone 2 days prior to admit.  Cardiology consulted -Continue to hold Losartan -Continue digoxin, spironolactone, lasix IV, milrinone gtt  -Evaluation for VAD in process  Hypotension  -Improved  Apical thrombus  -Continue Coumadin   AKI  -Resolved  Chronic hyponatremia secondary to chronic heart failure  -Resolved  Asthma  -Well controlled. Monitor  Polysubstance abuse  -UDS negative, and history of cocaine in the past  MCA CVA 2019  -No residual weakness.   Continue Coumadin, aspirin  Tobacco  -Cessation counseling     DVT prophylaxis: Coumadin Code Status: Full Family Communication: None at bedside Disposition Plan: Pending further heart failure team recommendations. On milrinone gtt currently.    Consultants:   Advanced heart failure  Palliative care   Procedures:   None   Antimicrobials:  Anti-infectives (From admission, onward)   Start     Dose/Rate Route Frequency Ordered Stop   06/11/19 1000  cefdinir (OMNICEF) capsule 300 mg  Status:  Discontinued    Note to Pharmacy: Has been tolerating cephalosporins   300 mg  Oral Every 12 hours 06/11/19 0804 06/11/19 0804   06/11/19 1000  cefdinir (OMNICEF) capsule 300 mg    Note to Pharmacy: Has been tolerating cephalosporins   300 mg Oral Every 12 hours 06/11/19 0804 06/13/19 2135   06/10/19 2200  doxycycline (VIBRA-TABS) tablet 100 mg     100 mg Oral Every 12 hours 06/10/19 1607 06/13/19 2135   06/10/19 0500  vancomycin (VANCOCIN) IVPB 750 mg/150 ml premix  Status:  Discontinued     750 mg 150 mL/hr over 60 Minutes Intravenous Every 12 hours 06/09/19 1402 06/09/19 1629   06/09/19 2300  ceFEPIme (MAXIPIME) 2 g in sodium chloride 0.9 % 100 mL IVPB  Status:  Discontinued     2 g 200 mL/hr over 30 Minutes Intravenous Every 8 hours 06/09/19 1400 06/09/19 1629   06/09/19 2300  cefTRIAXone (ROCEPHIN) 1 g in sodium chloride 0.9 % 100 mL IVPB  Status:  Discontinued     1 g 200 mL/hr over 30 Minutes Intravenous Every 24 hours 06/09/19 1628 06/11/19 0804   06/09/19 1630  azithromycin (ZITHROMAX) 500 mg in sodium chloride 0.9 % 250 mL IVPB  Status:  Discontinued     500 mg 250 mL/hr over 60 Minutes Intravenous Every 24 hours 06/09/19 1628 06/10/19 1607   06/09/19 1400  ceFEPIme (MAXIPIME) 2 g in sodium chloride 0.9 % 100 mL IVPB     2 g 200 mL/hr over 30 Minutes Intravenous  Once 06/09/19 1359 06/09/19 1521   06/09/19 1400  vancomycin (VANCOCIN) 1,250 mg in sodium chloride 0.9 % 250 mL IVPB  Status:  Discontinued     1,250 mg 166.7 mL/hr over 90 Minutes Intravenous  Once 06/09/19 1359 06/09/19 1700       Objective: Vitals:   06/15/19 0341 06/15/19 0743 06/15/19 0901 06/15/19 0921  BP:  92/69  107/74  Pulse:  (!) 108    Resp:  20    Temp:  98.4 F (36.9 C)    TempSrc:  Oral    SpO2:  99% 98%   Weight: 63.4 kg     Height:        Intake/Output Summary (Last 24 hours) at 06/15/2019 1028 Last data filed at 06/15/2019 0900 Gross per 24 hour  Intake 1340.63 ml  Output 4100 ml  Net -2759.37 ml   Filed Weights   06/13/19 0538 06/14/19 0616 06/15/19 0341   Weight: 67 kg 65.1 kg 63.4 kg    Examination: General exam: Appears calm and comfortable  Respiratory system: Respiratory effort normal on room air Cardiovascular system: S1 & S2 heard, RRR. No pedal edema. Gastrointestinal system: Abdomen is nondistended, soft and nontender. Normal bowel sounds heard. Central nervous system: Alert and oriented. Non focal exam. Speech clear  Extremities: Symmetric in appearance bilaterally  Skin: No rashes, lesions or ulcers on exposed skin  Psychiatry: Judgement and insight appear stable. Mood & affect appropriate.     Data Reviewed: I have personally reviewed following labs and imaging studies  CBC: Recent Labs  Lab 06/10/19 0038 06/11/19 0254 06/12/19 0448 06/13/19 0500 06/15/19 0421  WBC 12.3* 6.2 5.8 4.7 5.2  NEUTROABS 9.8*  --   --   --   --   HGB 11.2* 11.0* 11.1*  10.0* 10.2*  HCT 34.9* 33.2* 35.1* 31.8* 32.3*  MCV 81.2 79.6* 80.3 81.5 80.8  PLT 176 206 230 199 251   Basic Metabolic Panel: Recent Labs  Lab 06/09/19 1140  06/11/19 0254 06/12/19 0448 06/13/19 0500 06/14/19 0432 06/15/19 0421  NA 129*   < > 130* 132* 132* 133* 135  K 5.1   < > 3.9 3.8 4.1 3.8 3.7  CL 93*   < > 96* 98 96* 94* 95*  CO2 24   < > 32 29  GLUCOSE 107*   < > 97 110* 98 110* 109*  BUN 21*   < > 22* 24* 21* 17 15  CREATININE 1.30*   < > 1.09 1.21 1.06 1.04 1.21  CALCIUM 8.8*   < > 8.6* 8.8* 8.7* 8.9 8.9  MG 1.6*  --   --   --   --   --   --   PHOS 2.6  --   --   --   --   --   --    < > = values in this interval not displayed.   GFR: Estimated Creatinine Clearance: 69.1 mL/min (by C-G formula based on SCr of 1.21 mg/dL). Liver Function Tests: Recent Labs  Lab 06/10/19 0038  AST 35  ALT 19  ALKPHOS 75  BILITOT 2.5*  PROT 6.1*  ALBUMIN 2.5*   No results for input(s): LIPASE, AMYLASE in the last 168 hours. No results for input(s): AMMONIA in the last 168 hours. Coagulation Profile: Recent Labs  Lab 06/11/19 0254 06/12/19 0448  06/13/19 0500 06/14/19 0432 06/15/19 0421  INR 3.1* 2.7* 2.9* 2.9* 2.9*   Cardiac Enzymes: No results for input(s): CKTOTAL, CKMB, CKMBINDEX, TROPONINI in the last 168 hours. BNP (last 3 results) No results for input(s): PROBNP in the last 8760 hours. HbA1C: No results for input(s): HGBA1C in the last 72 hours. CBG: Recent Labs  Lab 06/10/19 1153  GLUCAP 148*   Lipid Profile: No results for input(s): CHOL, HDL, LDLCALC, TRIG, CHOLHDL, LDLDIRECT in the last 72 hours. Thyroid Function Tests: No results for input(s): TSH, T4TOTAL, FREET4, T3FREE, THYROIDAB in the last 72 hours. Anemia Panel: No results for input(s): VITAMINB12, FOLATE, FERRITIN, TIBC, IRON, RETICCTPCT in the last 72 hours. Sepsis Labs: Recent Labs  Lab 06/09/19 1140 06/09/19 1540 06/10/19 0038 06/10/19 1110 06/10/19 1417  PROCALCITON 5.97  --   --   --   --   LATICACIDVEN  --  1.6 2.6* 1.4 1.4    Recent Results (from the past 240 hour(s))  SARS CORONAVIRUS 2 (TAT 6-24 HRS) Nasopharyngeal Nasopharyngeal Swab     Status: None   Collection Time: 06/09/19  1:30 PM   Specimen: Nasopharyngeal Swab  Result Value Ref Range Status   SARS Coronavirus 2 NEGATIVE NEGATIVE Final    Comment: (NOTE) SARS-CoV-2 target nucleic acids are NOT DETECTED. The SARS-CoV-2 RNA is generally detectable in upper and lower respiratory specimens during the acute phase of infection. Negative results do not preclude SARS-CoV-2 infection, do not rule out co-infections with other pathogens, and should not be used as the sole basis for treatment or other patient management decisions. Negative results must be combined with clinical observations, patient history, and epidemiological information. The expected result is Negative. Fact Sheet for Patients: HairSlick.no Fact Sheet for Healthcare Providers: quierodirigir.com This test is not yet approved or cleared by the [place]  FDA and  has been authorized for detection and/or diagnosis of SARS-CoV-2 by FDA  under an Emergency Use Authorization (EUA). This EUA will remain  in effect (meaning this test can be used) for the duration of the COVID-19 declaration under Section 56 4(b)(1) of the Act, 21 U.S.C. section 360bbb-3(b)(1), unless the authorization is terminated or revoked sooner. Performed at [facility], [address] [place], [place] [address]   Culture, blood (routine x 2)     Status: None   Collection Time: 06/09/19  3:40 PM   Specimen: BLOOD LEFT FOREARM  Result Value Ref Range Status   Specimen Description BLOOD LEFT FOREARM  Final   Special Requests   Final    BOTTLES DRAWN AEROBIC AND ANAEROBIC Blood Culture adequate volume   Culture   Final    NO GROWTH 5 DAYS Performed at [facility], [address] [place], [place] [address]    Report Status 06/14/2019 FINAL  Final  Culture, blood (routine x 2)     Status: None   Collection Time: 06/09/19  4:38 PM   Specimen: BLOOD RIGHT HAND  Result Value Ref Range Status   Specimen Description BLOOD RIGHT HAND  Final   Special Requests   Final    BOTTLES DRAWN AEROBIC AND ANAEROBIC Blood Culture results may not be optimal due to an inadequate volume of blood received in culture bottles   Culture   Final    NO GROWTH 5 DAYS Performed at [facility], [address], [place], [place] [address]    Report Status 06/14/2019 FINAL  Final  Culture, sputum-assessment     Status: None   Collection Time: 06/10/19  6:55 PM   Specimen: Expectorated Sputum  Result Value Ref Range Status   Specimen Description EXPECTORATED SPUTUM  Final   Special Requests NONE  Final   Sputum evaluation   Final    THIS SPECIMEN IS ACCEPTABLE FOR SPUTUM CULTURE Performed at [facility], [address], [place], [place] [address]    Report Status 06/10/2019 FINAL  Final  MRSA PCR Screening     Status: None   Collection Time: 06/10/19  6:55 PM    Specimen: Nasal Mucosa; Nasopharyngeal  Result Value Ref Range Status   MRSA by PCR NEGATIVE NEGATIVE Final    Comment:        The GeneXpert MRSA Assay (FDA approved for NASAL specimens only), is one component of a comprehensive MRSA colonization surveillance program. It is not intended to diagnose MRSA infection nor to guide or monitor treatment for MRSA infections. Performed at [facility], [address], [place], [place] [address]   Culture, respiratory     Status: None   Collection Time: 06/10/19  6:55 PM  Result Value Ref Range Status   Specimen Description EXPECTORATED SPUTUM  Final   Special Requests NONE Reflexed from [id number]  Final   Gram Stain   Final    ABUNDANT WBC PRESENT,BOTH PMN AND MONONUCLEAR RARE GRAM VARIABLE ROD RARE GRAM POSITIVE COCCI Performed at [facility], [address] [place], [place] [address]    Culture RARE ESCHERICHIA COLI  Final   Report Status 06/13/2019 FINAL  Final   Organism ID, Bacteria ESCHERICHIA COLI  Final      Susceptibility   Escherichia coli - MIC*    AMPICILLIN >=32 RESISTANT Resistant     CEFAZOLIN >=64 RESISTANT Resistant     CEFEPIME <=1 SENSITIVE Sensitive     CEFTAZIDIME 4 SENSITIVE Sensitive     CEFTRIAXONE 8 SENSITIVE  Sensitive     CIPROFLOXACIN <=0.25 SENSITIVE Sensitive     GENTAMICIN <=1 SENSITIVE Sensitive     IMIPENEM <=0.25 SENSITIVE Sensitive     TRIMETH/SULFA <=20 SENSITIVE Sensitive     AMPICILLIN/SULBACTAM >=32 RESISTANT Resistant     PIP/TAZO <=4 SENSITIVE Sensitive     Extended ESBL NEGATIVE Sensitive     * RARE ESCHERICHIA COLI      Radiology Studies: Vas US Doppler Pre Vad  Result Date: 06/14/2019 PERIOPERATIVE VASCULAR EVALUATION Indications:      Pre vad. Risk Factors:     Prior CVA. Limitations:      patient talking Comparison Study: no prior Performing Technologist: Blanch Media RVS  Examination Guidelines: A complete evaluation includes B-mode imaging, spectral Doppler, color  Doppler, and power Doppler as needed of all accessible portions of each vessel. Bilateral testing is considered an integral part of a complete examination. Limited examinations for reoccurring indications may be performed as noted.  Right Carotid Findings: +----------+--------+--------+--------+------------+--------+           PSV cm/sEDV cm/sStenosisDescribe    Comments +----------+--------+--------+--------+------------+--------+ CCA Prox  97      18              heterogenous         +----------+--------+--------+--------+------------+--------+ CCA Distal101     20              heterogenous         +----------+--------+--------+--------+------------+--------+ ICA Prox  52      22      1-39%   heterogenous         +----------+--------+--------+--------+------------+--------+ ICA Distal73      30                                   +----------+--------+--------+--------+------------+--------+ ECA       86      21                                   +----------+--------+--------+--------+------------+--------+ Portions of this table do not appear on this page. +----------+--------+-------+--------+------------+           PSV cm/sEDV cmsDescribeArm Pressure +----------+--------+-------+--------+------------+ Subclavian148                    107          +----------+--------+-------+--------+------------+ +---------+--------+--+--------+--+---------+ VertebralPSV cm/s47EDV cm/s10Antegrade +---------+--------+--+--------+--+---------+ Left Carotid Findings: +----------+--------+--------+--------+------------+--------+           PSV cm/sEDV cm/sStenosisDescribe    Comments +----------+--------+--------+--------+------------+--------+ CCA Prox  118     22              heterogenous         +----------+--------+--------+--------+------------+--------+ CCA Distal108     26              heterogenous          +----------+--------+--------+--------+------------+--------+ ICA Prox  78      27      1-39%   heterogenous         +----------+--------+--------+--------+------------+--------+ ICA Distal75      33                                   +----------+--------+--------+--------+------------+--------+ ECA       94  22                                   +----------+--------+--------+--------+------------+--------+ +----------+--------+--------+--------+------------+ SubclavianPSV cm/sEDV cm/sDescribeArm Pressure +----------+--------+--------+--------+------------+           107                                  +----------+--------+--------+--------+------------+ +---------+--------+--+--------+-+ VertebralPSV cm/s28EDV cm/s5 +---------+--------+--+--------+-+  ABI Findings: +--------+------------------+-----+---------+--------+ Right   Rt Pressure (mmHg)IndexWaveform Comment  +--------+------------------+-----+---------+--------+ [id number]                    triphasic         +--------+------------------+-----+---------+--------+ PTA     100               0.93                   +--------+------------------+-----+---------+--------+ DP      124               1.16                   +--------+------------------+-----+---------+--------+ +--------+------------------+-----+---------+--------------------+ Left    Lt Pressure (mmHg)IndexWaveform Comment              +--------+------------------+-----+---------+--------------------+ Brachial                       triphasicrestricted extremity +--------+------------------+-----+---------+--------------------+ PTA     104               0.97                               +--------+------------------+-----+---------+--------------------+ DP      108               1.01                               +--------+------------------+-----+---------+--------------------+  +-------+---------------+----------------+ ABI/TBIToday's ABI/TBIPrevious ABI/TBI +-------+---------------+----------------+ Right  1.16                            +-------+---------------+----------------+ Left   1.01                            +-------+---------------+----------------+  Summary: Right Carotid: Velocities in the right ICA are consistent with a 1-39% stenosis. Left Carotid: Velocities in the left ICA are consistent with a 1-39% stenosis. Vertebrals: Bilateral vertebral arteries demonstrate antegrade flow.  *See table(s) above for measurements and observations. Right ABI: Resting right ankle-brachial index is within normal range. No evidence of significant right lower extremity arterial disease. Left ABI: Resting left ankle-brachial index is within normal range. No evidence of significant left lower extremity arterial disease.  Electronically signed by Sherald Hess MD on 06/14/2019 at 3:12:02 PM.    Final    Vas [place] Lower Extremity Venous (dvt)  Result Date: 06/14/2019  Lower Venous Study Indications: Lvad evaluation.  Comparison Study: previous study dobe 01/03/16 Performing Technologist: Blanch Media RVS  Examination Guidelines: A complete evaluation includes B-mode imaging, spectral Doppler, color Doppler, and power Doppler as needed of all accessible portions of each vessel. Bilateral testing is considered an integral part  of a complete examination. Limited examinations for reoccurring indications may be performed as noted.  +---------+---------------+---------+-----------+----------+--------------+ RIGHT    CompressibilityPhasicitySpontaneityPropertiesThrombus Aging +---------+---------------+---------+-----------+----------+--------------+ CFV      Full           Yes      Yes                                 +---------+---------------+---------+-----------+----------+--------------+ SFJ      Full                                                         +---------+---------------+---------+-----------+----------+--------------+ FV Prox  Full                                                        +---------+---------------+---------+-----------+----------+--------------+ FV Mid   Full                                                        +---------+---------------+---------+-----------+----------+--------------+ FV DistalFull                                                        +---------+---------------+---------+-----------+----------+--------------+ PFV      Full                                                        +---------+---------------+---------+-----------+----------+--------------+ POP      Full           Yes      Yes                                 +---------+---------------+---------+-----------+----------+--------------+ PTV      Full                                                        +---------+---------------+---------+-----------+----------+--------------+ PERO     Full                                                        +---------+---------------+---------+-----------+----------+--------------+   +---------+---------------+---------+-----------+----------+--------------+ LEFT     CompressibilityPhasicitySpontaneityPropertiesThrombus Aging +---------+---------------+---------+-----------+----------+--------------+ CFV      Full           Yes  Yes                                 +---------+---------------+---------+-----------+----------+--------------+ SFJ      Full                                                        +---------+---------------+---------+-----------+----------+--------------+ FV Prox  Full                                                        +---------+---------------+---------+-----------+----------+--------------+ FV Mid   Full                                                         +---------+---------------+---------+-----------+----------+--------------+ FV DistalFull                                                        +---------+---------------+---------+-----------+----------+--------------+ PFV      Full                                                        +---------+---------------+---------+-----------+----------+--------------+ POP      Full           Yes      Yes                                 +---------+---------------+---------+-----------+----------+--------------+ PTV      Full                                                        +---------+---------------+---------+-----------+----------+--------------+ PERO     Full                                                        +---------+---------------+---------+-----------+----------+--------------+     Summary: Right: There is no evidence of deep vein thrombosis in the lower extremity. No cystic structure found in the popliteal fossa. Left: There is no evidence of deep vein thrombosis in the lower extremity. No cystic structure found in the popliteal fossa.  *See table(s) above for measurements and observations. Electronically signed by Sherald Hess MD on 06/14/2019 at 3:11:11 PM.    Final       Scheduled Meds: .  aspirin EC  81 mg Oral Daily  . Chlorhexidine Gluconate Cloth  6 each Topical Daily  . digoxin  0.125 mg Oral Daily  . furosemide  80 mg Intravenous BID  . ipratropium  0.5 mg Nebulization BID  . mometasone-formoterol  2 puff Inhalation BID  . sodium chloride flush  3 mL Intravenous Q12H  . spironolactone  25 mg Oral Daily  . warfarin  2.5 mg Oral Q Tue-1800  . warfarin  5 mg Oral Once per day on Sun Mon Wed Thu Fri Sat   Continuous Infusions: . milrinone 0.375 mcg/kg/min (06/15/19 0431)     LOS: 6 days      Time spent: 25 minutes   Dessa Phi, DO [facility] 06/15/2019, 10:28 AM   Available via Epic secure chat 7am-7pm After these  hours, please refer to coverage provider listed on [email or web address]

## 2019-06-15 NOTE — Progress Notes (Signed)
Patient stated he would place himself on CPAP when ready.

## 2019-06-16 LAB — BASIC METABOLIC PANEL
Anion gap: 8 (ref 5–15)
BUN: 20 mg/dL (ref 6–20)
CO2: 29 mmol/L (ref 22–32)
Calcium: 8.8 mg/dL — ABNORMAL LOW (ref 8.9–10.3)
Chloride: 99 mmol/L (ref 98–111)
Creatinine, Ser: 1.09 mg/dL (ref 0.61–1.24)
GFR calc Af Amer: >60 mL/min (ref >60)
GFR calc non Af Amer: >60 mL/min (ref >60)
Glucose, Bld: 95 mg/dL (ref 70–99)
Potassium: 4.2 mmol/L (ref 3.5–5.1)
Sodium: 136 mmol/L (ref 135–145)

## 2019-06-16 LAB — PROTIME-INR
INR: 2.5 — ABNORMAL HIGH (ref 0.8–1.2)
Prothrombin Time: 27 seconds — ABNORMAL HIGH (ref 11.4–15.2)

## 2019-06-16 LAB — COOXEMETRY PANEL
Carboxyhemoglobin: 1.4 % (ref 0.5–1.5)
Methemoglobin: 1.3 % (ref 0.0–1.5)
O2 Saturation: 49 %
Total hemoglobin: 10.3 g/dL — ABNORMAL LOW (ref 12.0–16.0)

## 2019-06-16 NOTE — Progress Notes (Signed)
PROGRESS NOTE    Mark ScottJeremie C Dedeaux  ZOX:[id number] DOB: 01/08/1976 DOA: 06/09/2019 PCP: Grayce SessionsEdwards, Michelle P, NP     Brief Narrative:  Mark [name] is a 43 year old male with past medical history of chronic systolic heart failure (EF 10 to 15% with left apical thrombus with severely diffuse hypokinesis 12/2018) NICM by cardiac cath in 2017, history of right MCA CVA 2019, recurrent cardiogenic shock, asthma, polysubstance abuse who presented to the ED on 10/18 with complaints of productive cough, fever and body aches x2 weeks and found to have WBC 13 with chest x-ray with right middle lobe pneumonia, Covid negative and on admission his weight was up 13 pounds from previous discharge.  He was started on Vanco/cefepime in ED and changed to Rocephin/azithromycin for CAP therapy and continued on remaining home meds.  Overnight 10/18-10/19 a rapid response for hypotension was called after patient received his home BP meds and patient received 250 cc bolus NS with slightly improved BP however again had drop in blood pressure.  He received albumin x2 for recurrent hypotension throughout the same day after his blood pressure meds were held.  He remained hypotensive but asymptomatic.  Cardiology was consulted who deemed patient not a candidate for advanced heart failure therapies due to RV dysfunction substance abuse and palliative care was consulted.  Patient was concerned about having an adverse reaction to azithromycin and was changed to doxycycline/Rocephin for CAP coverage, then eventually deescalated to cefdinir, doxycycline.   New events last 24 hours / Subjective: No new complaints today  Assessment & Plan:   Principal Problem:   Acute on chronic systolic (congestive) heart failure (HCC) Active Problems:   Tobacco abuse   Asthma   NICM (nonischemic cardiomyopathy) (HCC)   History of right MCA stroke   Right middle lobe pneumonia   Polysubstance abuse (HCC)   Chronic hyponatremia    Strep  pneumonia CAP in setting of right middle lobe pneumonia  -Positive S pneumo urinary antigen, blood cultures with no growth to date, Covid negative.  Completed antibiotics 10/18-10/22 for total 5 day course.   Acute on chronic biventricular heart failure with low output  -History of nonischemic cardiomyopathy.  EF 5 to 10% in May 2020, RV severely dysfunctional.  Had RHC in August with low output and high filling pressure and required inotropic support, previously on milrinone while hospitalized but refused home milrinone.  Not a candidate for LVAD with right heart failure.  BNP elevated but at/below baseline.  Weight up but asymptomatic.  Ran out of spironolactone 2 days prior to admit.  Cardiology consulted -Continue to hold Losartan -Continue digoxin, spironolactone, lasix IV, milrinone gtt  -Evaluation for VAD in process, planning for right heart cath tomorrow  Hypotension  -Improved  Apical thrombus  -Continue Coumadin   AKI  -Resolved  Chronic hyponatremia secondary to chronic heart failure  -Resolved  Asthma  -Well controlled. Monitor  Polysubstance abuse  -UDS negative, and history of cocaine in the past  MCA CVA 2019  -No residual weakness.   Continue Coumadin, aspirin  Tobacco  -Cessation counseling     DVT prophylaxis: Coumadin Code Status: Full Family Communication: None at bedside Disposition Plan: Pending further heart failure team recommendations. On milrinone gtt currently.  Right heart cath planned for tomorrow   Consultants:   Advanced heart failure  Palliative care   Procedures:   None   Antimicrobials:  Anti-infectives (From admission, onward)   Start     Dose/Rate Route Frequency Ordered Stop  06/11/19 1000  cefdinir (OMNICEF) capsule 300 mg  Status:  Discontinued    Note to Pharmacy: Has been tolerating cephalosporins   300 mg Oral Every 12 hours 06/11/19 0804 06/11/19 0804   06/11/19 1000  cefdinir (OMNICEF) capsule 300 mg     Note to Pharmacy: Has been tolerating cephalosporins   300 mg Oral Every 12 hours 06/11/19 0804 06/13/19 2135   06/10/19 2200  doxycycline (VIBRA-TABS) tablet 100 mg     100 mg Oral Every 12 hours 06/10/19 1607 06/13/19 2135   06/10/19 0500  vancomycin (VANCOCIN) IVPB 750 mg/150 ml premix  Status:  Discontinued     750 mg 150 mL/hr over 60 Minutes Intravenous Every 12 hours 06/09/19 1402 06/09/19 1629   06/09/19 2300  ceFEPIme (MAXIPIME) 2 g in sodium chloride 0.9 % 100 mL IVPB  Status:  Discontinued     2 g 200 mL/hr over 30 Minutes Intravenous Every 8 hours 06/09/19 1400 06/09/19 1629   06/09/19 2300  cefTRIAXone (ROCEPHIN) 1 g in sodium chloride 0.9 % 100 mL IVPB  Status:  Discontinued     1 g 200 mL/hr over 30 Minutes Intravenous Every 24 hours 06/09/19 1628 06/11/19 0804   06/09/19 1630  azithromycin (ZITHROMAX) 500 mg in sodium chloride 0.9 % 250 mL IVPB  Status:  Discontinued     500 mg 250 mL/hr over 60 Minutes Intravenous Every 24 hours 06/09/19 1628 06/10/19 1607   06/09/19 1400  ceFEPIme (MAXIPIME) 2 g in sodium chloride 0.9 % 100 mL IVPB     2 g 200 mL/hr over 30 Minutes Intravenous  Once 06/09/19 1359 06/09/19 1521   06/09/19 1400  vancomycin (VANCOCIN) 1,250 mg in sodium chloride 0.9 % 250 mL IVPB  Status:  Discontinued     1,250 mg 166.7 mL/hr over 90 Minutes Intravenous  Once 06/09/19 1359 06/09/19 1700       Objective: Vitals:   06/16/19 0454 06/16/19 0657 06/16/19 0731 06/16/19 0924  BP: 111/68  102/82   Pulse: (!) 54  (!) 107   Resp: 20     Temp: 98.1 F (36.7 C)  98.3 F (36.8 C)   TempSrc: Oral  Oral   SpO2: 100%  100% 98%  Weight:  65.3 kg    Height:        Intake/Output Summary (Last 24 hours) at 06/16/2019 1102 Last data filed at 06/16/2019 0657 Gross per 24 hour  Intake 653.42 ml  Output 850 ml  Net -196.58 ml   Filed Weights   06/14/19 0616 06/15/19 0341 06/16/19 0657  Weight: 65.1 kg 63.4 kg 65.3 kg    Examination: General exam:  Appears calm and comfortable  Respiratory system:  Respiratory effort normal.  On room air Cardiovascular system: S1 & S2 heard, regular rhythm, tachycardic. No pedal edema. Gastrointestinal system: Abdomen is nondistended, soft and nontender. Normal bowel sounds heard. Central nervous system: Alert and oriented. Non focal exam. Speech clear  Extremities: Symmetric in appearance bilaterally  Skin: No rashes, lesions or ulcers on exposed skin  Psychiatry: Judgement and insight appear stable. Mood & affect appropriate.    Data Reviewed: I have personally reviewed following labs and imaging studies  CBC: Recent Labs  Lab 06/10/19 0038 06/11/19 0254 06/12/19 0448 06/13/19 0500 06/15/19 0421  WBC 12.3* 6.2 5.8 4.7 5.2  NEUTROABS 9.8*  --   --   --   --   HGB 11.2* 11.0* 11.1* 10.0* 10.2*  HCT 34.9* 33.2* 35.1* 31.8* 32.3*  MCV  81.2 79.6* 80.3 81.5 80.8  PLT 176 206 230 199 251   Basic Metabolic Panel: Recent Labs  Lab 06/09/19 1140  06/12/19 0448 06/13/19 0500 06/14/19 0432 06/15/19 0421 06/16/19 0510  NA 129*   < > 132* 132* 133* 135 136  K 5.1   < > 3.8 4.1 3.8 3.7 4.2  CL 93*   < > 98 96* 94* 95* 99  CO2 24   < > 24 27 32 29 29  GLUCOSE 107*   < > 110* 98 110* 109* 95  BUN 21*   < > 24* 21* 17 15 20   CREATININE 1.30*   < > 1.21 1.06 1.04 1.21 1.09  CALCIUM 8.8*   < > 8.8* 8.7* 8.9 8.9 8.8*  MG 1.6*  --   --   --   --   --   --   PHOS 2.6  --   --   --   --   --   --    < > = values in this interval not displayed.   GFR: Estimated Creatinine Clearance: 79 mL/min (by C-G formula based on SCr of 1.09 mg/dL). Liver Function Tests: Recent Labs  Lab 06/10/19 0038  AST 35  ALT 19  ALKPHOS 75  BILITOT 2.5*  PROT 6.1*  ALBUMIN 2.5*   No results for input(s): LIPASE, AMYLASE in the last 168 hours. No results for input(s): AMMONIA in the last 168 hours. Coagulation Profile: Recent Labs  Lab 06/12/19 0448 06/13/19 0500 06/14/19 0432 06/15/19 0421 06/16/19 0510   INR 2.7* 2.9* 2.9* 2.9* 2.5*   Cardiac Enzymes: No results for input(s): CKTOTAL, CKMB, CKMBINDEX, TROPONINI in the last 168 hours. BNP (last 3 results) No results for input(s): PROBNP in the last 8760 hours. HbA1C: No results for input(s): HGBA1C in the last 72 hours. CBG: Recent Labs  Lab 06/10/19 1153  GLUCAP 148*   Lipid Profile: No results for input(s): CHOL, HDL, LDLCALC, TRIG, CHOLHDL, LDLDIRECT in the last 72 hours. Thyroid Function Tests: No results for input(s): TSH, T4TOTAL, FREET4, T3FREE, THYROIDAB in the last 72 hours. Anemia Panel: No results for input(s): VITAMINB12, FOLATE, FERRITIN, TIBC, IRON, RETICCTPCT in the last 72 hours. Sepsis Labs: Recent Labs  Lab 06/09/19 1140 06/09/19 1540 06/10/19 0038 06/10/19 1110 06/10/19 1417  PROCALCITON 5.97  --   --   --   --   LATICACIDVEN  --  1.6 2.6* 1.4 1.4    Recent Results (from the past 240 hour(s))  SARS CORONAVIRUS 2 (TAT 6-24 HRS) Nasopharyngeal Nasopharyngeal Swab     Status: None   Collection Time: 06/09/19  1:30 PM   Specimen: Nasopharyngeal Swab  Result Value Ref Range Status   SARS Coronavirus 2 NEGATIVE NEGATIVE Final    Comment: (NOTE) SARS-CoV-2 target nucleic acids are NOT DETECTED. The SARS-CoV-2 RNA is generally detectable in upper and lower respiratory specimens during the acute phase of infection. Negative results do not preclude SARS-CoV-2 infection, do not rule out co-infections with other pathogens, and should not be used as the sole basis for treatment or other patient management decisions. Negative results must be combined with clinical observations, patient history, and epidemiological information. The expected result is Negative. Fact Sheet for Patients: HairSlick.no Fact Sheet for Healthcare Providers: quierodirigir.com This test is not yet approved or cleared by the [place] FDA and  has been authorized for detection  and/or diagnosis of SARS-CoV-2 by FDA under an Emergency Use Authorization (EUA). This EUA will remain  in effect (meaning this test can be used) for the duration of the COVID-19 declaration under Section 56 4(b)(1) of the Act, 21 U.S.C. section 360bbb-3(b)(1), unless the authorization is terminated or revoked sooner. Performed at [facility], [address] [place], [place] [address]   Culture, blood (routine x 2)     Status: None   Collection Time: 06/09/19  3:40 PM   Specimen: BLOOD LEFT FOREARM  Result Value Ref Range Status   Specimen Description BLOOD LEFT FOREARM  Final   Special Requests   Final    BOTTLES DRAWN AEROBIC AND ANAEROBIC Blood Culture adequate volume   Culture   Final    NO GROWTH 5 DAYS Performed at [facility], [address] [place], [place] [address]    Report Status 06/14/2019 FINAL  Final  Culture, blood (routine x 2)     Status: None   Collection Time: 06/09/19  4:38 PM   Specimen: BLOOD RIGHT HAND  Result Value Ref Range Status   Specimen Description BLOOD RIGHT HAND  Final   Special Requests   Final    BOTTLES DRAWN AEROBIC AND ANAEROBIC Blood Culture results may not be optimal due to an inadequate volume of blood received in culture bottles   Culture   Final    NO GROWTH 5 DAYS Performed at [facility], [address] [place], [place] [address]    Report Status 06/14/2019 FINAL  Final  Culture, sputum-assessment     Status: None   Collection Time: 06/10/19  6:55 PM   Specimen: Expectorated Sputum  Result Value Ref Range Status   Specimen Description EXPECTORATED SPUTUM  Final   Special Requests NONE  Final   Sputum evaluation   Final    THIS SPECIMEN IS ACCEPTABLE FOR SPUTUM CULTURE Performed at [facility], [address] [place], [place] [address]    Report Status 06/10/2019 FINAL  Final  MRSA PCR Screening     Status: None   Collection Time: 06/10/19  6:55 PM   Specimen: Nasal Mucosa; Nasopharyngeal   Result Value Ref Range Status   MRSA by PCR NEGATIVE NEGATIVE Final    Comment:        The GeneXpert MRSA Assay (FDA approved for NASAL specimens only), is one component of a comprehensive MRSA colonization surveillance program. It is not intended to diagnose MRSA infection nor to guide or monitor treatment for MRSA infections. Performed at [name], [address] [place], [place] [address]   Culture, respiratory     Status: None   Collection Time: 06/10/19  6:55 PM  Result Value Ref Range Status   Specimen Description EXPECTORATED SPUTUM  Final   Special Requests NONE Reflexed from [id number]  Final   Gram Stain   Final    ABUNDANT WBC PRESENT,BOTH PMN AND MONONUCLEAR RARE GRAM VARIABLE ROD RARE GRAM POSITIVE COCCI Performed at [facility], [address] [place], [place] [address]    Culture RARE ESCHERICHIA COLI  Final   Report Status 06/13/2019 FINAL  Final   Organism ID, Bacteria ESCHERICHIA COLI  Final      Susceptibility   Escherichia coli - MIC*    AMPICILLIN >=32 RESISTANT Resistant     CEFAZOLIN >=64 RESISTANT Resistant     CEFEPIME <=1 SENSITIVE Sensitive     CEFTAZIDIME 4 SENSITIVE Sensitive     CEFTRIAXONE 8 SENSITIVE Sensitive     CIPROFLOXACIN <=0.25 SENSITIVE Sensitive  GENTAMICIN <=1 SENSITIVE Sensitive     IMIPENEM <=0.25 SENSITIVE Sensitive     TRIMETH/SULFA <=20 SENSITIVE Sensitive     AMPICILLIN/SULBACTAM >=32 RESISTANT Resistant     PIP/TAZO <=4 SENSITIVE Sensitive     Extended ESBL NEGATIVE Sensitive     * RARE ESCHERICHIA COLI      Radiology Studies: No results found.    Scheduled Meds: . aspirin EC  81 mg Oral Daily  . Chlorhexidine Gluconate Cloth  6 each Topical Daily  . digoxin  0.125 mg Oral Daily  . furosemide  80 mg Intravenous BID  . ipratropium  0.5 mg Nebulization BID  . mometasone-formoterol  2 puff Inhalation BID  . sodium chloride flush  3 mL Intravenous Q12H  . spironolactone  25 mg Oral Daily  .  warfarin  2.5 mg Oral Q Tue-1800  . warfarin  5 mg Oral Once per day on Sun Mon Wed Thu Fri Sat  . Warfarin - Pharmacist Dosing Inpatient   Does not apply q1800   Continuous Infusions: . milrinone 0.375 mcg/kg/min (06/16/19 0905)     LOS: 7 days      Time spent: 25 minutes   Noralee Stain, DO [facility] 06/16/2019, 11:02 AM   Available via Epic secure chat 7am-7pm After these hours, please refer to coverage provider listed on [email or web address]

## 2019-06-16 NOTE — Progress Notes (Addendum)
Advanced Heart Failure Rounding Note  PCP-Cardiologist: Verne Carrow, MD   Subjective:    Remains on 0.375 mcg. CO-OX 47.5% -> 53.9% -> 49%. Refused lasix yesterday am due to cramping. Weight up 5 pounds. CVP 12-13  Denies SOB, orthopnea or PND    Objective:   Weight Range: 65.3 kg Body mass index is 21.27 kg/m.   Vital Signs:   Temp:  [98.1 F (36.7 C)-98.5 F (36.9 C)] 98.3 F (36.8 C) (10/25 0731) Pulse Rate:  [54-107] 107 (10/25 0731) Resp:  [20] 20 (10/25 0454) BP: (102-111)/(68-82) 102/82 (10/25 0731) SpO2:  [97 %-100 %] 100 % (10/25 0731) Weight:  [65.3 kg] 65.3 kg (10/25 0657) Last BM Date: 06/15/19  Weight change: Filed Weights   06/14/19 0616 06/15/19 0341 06/16/19 0657  Weight: 65.1 kg 63.4 kg 65.3 kg    Intake/Output:   Intake/Output Summary (Last 24 hours) at 06/16/2019 0841 Last data filed at 06/16/2019 0657 Gross per 24 hour  Intake 893.42 ml  Output 1150 ml  Net -256.58 ml      Physical Exam   General:  Sitting up. No resp difficulty HEENT: normal Neck: supple. JVP to jaw. Carotids 2+ bilat; no bruits. No lymphadenopathy or thryomegaly appreciated. Cor: PMI nondisplaced. Tachy + s3 Lungs: clear Abdomen: soft, nontender, nondistended. No hepatosplenomegaly. No bruits or masses. Good bowel sounds. Extremities: no cyanosis, clubbing, rash, edema Neuro: alert & orientedx3, cranial nerves grossly intact. moves all 4 extremities w/o difficulty. Affect pleasant   Telemetry   ST 100-110 Personally reviewed  EKG    N/A  Labs    CBC Recent Labs    06/15/19 0421  WBC 5.2  HGB 10.2*  HCT 32.3*  MCV 80.8  PLT 251   Basic Metabolic Panel Recent Labs    29/02/11 0421 06/16/19 0510  NA 135 136  K 3.7 4.2  CL 95* 99  CO2 29 29  GLUCOSE 109* 95  BUN 15 20  CREATININE 1.21 1.09  CALCIUM 8.9 8.8*   Liver Function Tests No results for input(s): AST, ALT, ALKPHOS, BILITOT, PROT, ALBUMIN in the last 72 hours. No  results for input(s): LIPASE, AMYLASE in the last 72 hours. Cardiac Enzymes No results for input(s): CKTOTAL, CKMB, CKMBINDEX, TROPONINI in the last 72 hours.  BNP: BNP (last 3 results) Recent Labs    02/13/19 2243 05/08/19 2018 06/09/19 1140  BNP 3,902.9* 3,017.6* 2,175.3*    ProBNP (last 3 results) No results for input(s): PROBNP in the last 8760 hours.   D-Dimer No results for input(s): DDIMER in the last 72 hours. Hemoglobin A1C No results for input(s): HGBA1C in the last 72 hours. Fasting Lipid Panel No results for input(s): CHOL, HDL, LDLCALC, TRIG, CHOLHDL, LDLDIRECT in the last 72 hours. Thyroid Function Tests No results for input(s): TSH, T4TOTAL, T3FREE, THYROIDAB in the last 72 hours.  Invalid input(s): FREET3  Other results:   Imaging    No results found.   Medications:     Scheduled Medications: . aspirin EC  81 mg Oral Daily  . Chlorhexidine Gluconate Cloth  6 each Topical Daily  . digoxin  0.125 mg Oral Daily  . furosemide  80 mg Intravenous BID  . ipratropium  0.5 mg Nebulization BID  . mometasone-formoterol  2 puff Inhalation BID  . sodium chloride flush  3 mL Intravenous Q12H  . spironolactone  25 mg Oral Daily  . warfarin  2.5 mg Oral Q Tue-1800  . warfarin  5 mg Oral Once  per day on Sun Mon Wed Thu Fri Sat  . Warfarin - Pharmacist Dosing Inpatient   Does not apply q1800    Infusions: . milrinone 0.375 mcg/kg/min (06/15/19 1801)    PRN Medications: acetaminophen, albuterol, diphenhydrAMINE, sodium chloride flush    Patient Profile   43 y/o male with 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 Has had several recent admits for cardiogenic shock supported with milrinone but has refused home inotropes. Now readmitted with RML PNA and recurrent low output HF and lactic acidosis. He is not candidate for advanced therapies due to severe RV dysfunction and substance abuse.   Assessment/Plan   1. Rt Middle Lobe PNA: COVID negative. Step  Pneumo +. Blood cultures negative. Received IV Vanco and cefepime in ED>>changed to cefdinir + doxycycline.  - Has completed abx  2. Acute on Chronic Biventricular Heart Failure w/ Low Output: History of nonischemic cardiomyopathy. Lastecho  5/20 showed that EF is markedly low at 5-10% with LV apical thrombus. The RV is severely dysfunctional as well. RHC in 8/20 with low output and high filling pressures. He required inotropic support previous 2 admits (initial co-ox in the 30s>>improved to 63% w/ milrinone) but refuses home milrinone. Not a candidate for LVAD w/ severe RV failure and substance abuse.  -We discussed home inotropes but he continues to refuse. We have consulted palliative care to see today to discuss hospice.   -He has spoken to the Hospice team and has told them he is now interested in LVAD.  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 He has also previously refused VAD saying he would continue to do anything he wants to do and not be able to comply with what was needed to take care of a VAD. -Now wants to proceed with VAD w/u. He understands  RV dysfunction may be prohibitive but we will perform RHC on milrinone tomorrow and get echo to see if RV is recruitable.  - Co-ox remains low at 49% on milrinone 0.375 mcg.  - Volume status improved on IV lasix but CVP still 12-13. Renal function stable. Continue IV lasix today (if he doesn't refuse) - Continue digoxin and spiro  - VAD work up underway. - Plan RHC tomorrow  3. Apical Thrombus: Identified on ECHO 12/2018.  -on coumadin. INR stable at  2.5 today  No bleeding. Discussed dosing with PharmD personally.   4. H/O MCA CVA 2019 :  - Continue atorvastatin 40 mg daily. - Continue Coumadin + aspirin 81 mg daily.  - INR therapeutic at 2.5   5. AKI: Scr 1.30 on admit. (baseline ~1.0) - suspect 2/2 low output HF and hypotension  - previously required milrinone last 2 admits but refuses home ionotropes -Creatinine up slightly  1.0 -> 1.1 - Watch with diuresis  6. CODE STATUS: Full code for now. See discussion above  Length of Stay: [place], MD  06/16/2019, 8:41 AM  Advanced Heart Failure Team Pager [phone] (M-F; 7a - 4p)  Please contact [facility] for night-coverage after hours (4p -7a ) and weekends on [email or web address]

## 2019-06-16 NOTE — Progress Notes (Signed)
ANTICOAGULATION CONSULT NOTE - [place] for warfarin Indication: apical thrombus  Allergies  Allergen Reactions  . Clindamycin/Lincomycin Swelling and Palpitations  . Hydrocodone Hives  . Lisinopril Swelling and Other (See Comments)    Facial swelling/angioedema  . Olopatadine Hcl Swelling    Makes his face swell up  . Prednisone Shortness Of Breath, Nausea Only, Swelling and Other (See Comments)    Also made chest feel tight and genital area, legs, and face became swollen badly  . Lasix [Furosemide] Hives and Swelling    Facial swelling Reaction to name brand LASIX  . Penicillins Hives and Swelling     Has patient had a PCN reaction causing immediate rash, facial/tongue/throat swelling, SOB or lightheadedness with hypotension: Yes Has patient had a PCN reaction causing severe rash involving mucus membranes or skin necrosis: No Has patient had a PCN reaction that required hospitalization: No Has patient had a PCN reaction occurring within the last 10 years: No If all of the above answers are "NO", then may proceed with Cephalosporin use.   . Bactrim [Sulfamethoxazole-Trimethoprim] Swelling and Rash    Facial swelling    Patient Measurements: Height: 5\' 9"  (175.3 cm) Weight: 144 lb (65.3 kg) IBW/kg (Calculated) : 70.7  Vital Signs: Temp: 98.3 F (36.8 C) (10/25 0731) Temp Source: Oral (10/25 0731) BP: 102/82 (10/25 0731) Pulse Rate: 107 (10/25 0731)  Labs: Recent Labs    06/14/19 0432 06/15/19 0421 06/16/19 0510  HGB  --  10.2*  --   HCT  --  32.3*  --   PLT  --  251  --   LABPROT 29.6* 29.6* 27.0*  INR 2.9* 2.9* 2.5*  CREATININE 1.04 1.21 1.09    Estimated Creatinine Clearance: 79 mL/min (by C-G formula based on SCr of 1.09 mg/dL).   Medical History: Past Medical History:  Diagnosis Date  . Asthma   . Chronic systolic CHF (congestive heart failure) ([place])   . Cigarette smoker   . CKD (chronic kidney disease), stage II    Archie Endo 10/01/2017  . COPD (chronic obstructive pulmonary disease) ([place]) 10/21/2017   on CT scan chest  . History of echocardiogram    a. Echo 5/17 - EF 20-25%, severe diff HK, restrictive physiology, mild to mod MR, severe reduced RVSF, mod RVE, mild RAE, mod TR, PASP 48 mmHg  . Hx of cardiac cath    a. LHC 5/17 - normal coronary arteries. PA 45/25, mean 33, PCWP mean 18  . NICM (nonischemic cardiomyopathy) ([place])   . Stroke ([place]) 09/27/2017   "was weak on my left side; I'm fully recovered" (11/09/2017)  . Substance abuse ([place])    cocaine, marijuana     Assessment: 32 yoM with end-stage cardiomyopathy admitted with acute CHF. Pt on warfarin PTA for hx of apical thrombus on ECHO 12/2018. Pt resumed on home warfarin regimen, INR has remained therapeutic. LVAD workup ongoing.  Planning RHC tomorrow.  *PTA dose = 5mg  daily except 2.5mg  Tues  Goal of Therapy:  INR 2-3 Monitor platelets by anticoagulation protocol: Yes   Plan:  -Continue Warfarin 5mg  daily except 2.5mg  Tues -Daily PT/INR.  Marguerite Olea, [facility] Clinical Pharmacist Phone [phone]  06/16/2019 10:36 AM

## 2019-06-16 NOTE — Progress Notes (Signed)
Daily Progress Note   Patient Name: Mark [name]       Date: 06/16/2019 DOB: 21-Jun-1976  Age: 43 y.o. MRN#: [id number] Attending Physician: Dessa Phi, DO Primary Care Physician: Kerin Perna, NP Admit Date: 06/09/2019  Reason for Consultation/Follow-up: Establishing goals of care  Subjective: I met again at the bedside with Mark [name].  Reports he is doing well and feels better than when I saw him last week.  Discussed plan for heart cath as well as his hope that he will be acceptable candidate for heart cath.  Length of Stay: 7  Current Medications: Scheduled Meds:  . aspirin EC  81 mg Oral Daily  . Chlorhexidine Gluconate Cloth  6 each Topical Daily  . digoxin  0.125 mg Oral Daily  . furosemide  80 mg Intravenous BID  . ipratropium  0.5 mg Nebulization BID  . mometasone-formoterol  2 puff Inhalation BID  . sodium chloride flush  3 mL Intravenous Q12H  . spironolactone  25 mg Oral Daily  . warfarin  2.5 mg Oral Q Tue-1800  . warfarin  5 mg Oral Once per day on Sun Mon Wed Thu Fri Sat  . Warfarin - Pharmacist Dosing Inpatient   Does not apply q1800    Continuous Infusions: . milrinone 0.375 mcg/kg/min (06/16/19 2228)    PRN Meds: acetaminophen, albuterol, diphenhydrAMINE, sodium chloride flush  Physical Exam    General: Alert, awake, in no acute distress. Chronically ill appearing. Heart: Regular rate and rhythm.  Lungs: Good air movement, clear Abdomen: Soft, nontender, nondistended, positive bowel sounds.  Ext: No significant edema Skin: Warm and dry Neuro: Grossly intact, nonfocal.       Vital Signs: BP 107/77 (BP Location: Right Arm)   Pulse 100   Temp (!) 97.5 F (36.4 C) (Oral)   Resp 20   Ht '5\' 9"'  (1.753 m)   Wt 65.3 kg   SpO2 100%   BMI  21.27 kg/m  SpO2: SpO2: 100 % O2 Device: O2 Device: Room Air O2 Flow Rate:    Intake/output summary:   Intake/Output Summary (Last 24 hours) at 06/16/2019 2318 Last data filed at 06/16/2019 2000 Gross per 24 hour  Intake 913.4 ml  Output 3350 ml  Net -2436.6 ml   LBM: Last BM Date: 06/15/19 Baseline Weight: Weight: 67.2 kg  Most recent weight: Weight: 65.3 kg       Palliative Assessment/Data:    Flowsheet Rows     Most Recent Value  Intake Tab  Referral Department  Cardiology  Unit at Time of Referral  Cardiac/Telemetry Unit  Palliative Care Primary Diagnosis  Cardiac  Date Notified  06/10/19  Palliative Care Type  Return patient Palliative Care  Reason for referral  Clarify Goals of Care  Date of Admission  06/09/19  Date first seen by Palliative Care  06/11/19  # of days Palliative referral response time  1 Day(s)  # of days IP prior to Palliative referral  1  Clinical Assessment  Palliative Performance Scale Score  40%  Psychosocial & Spiritual Assessment  Palliative Care Outcomes  Patient/Family meeting held?  Yes  Who was at the meeting?  Patient, wife via phone  Palliative Care Outcomes  Clarified goals of care      Patient Active Problem List   Diagnosis Date Noted  . Right middle lobe pneumonia 06/09/2019  . Polysubstance abuse ([place]) 06/09/2019  . Chronic hyponatremia 06/09/2019  . Malnutrition of moderate degree 04/10/2019  . Cardiogenic shock ([place]) 04/09/2019  . Allergic reaction caused by a drug 02/14/2019  . Abnormal liver function 02/14/2019  . Encounter for therapeutic drug monitoring 02/13/2019  . Encounter to establish care 02/13/2019  . 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 Acute CHF (congestive heart failure) ([place]) 01/24/2019  . Shortness of breath   . Cocaine use   . Acute on chronic systolic (congestive) heart failure ([place]) 12/25/2018  . Demand ischemia ([place])   . LV (left ventricular) mural thrombus without MI ([place])   . Noncompliance    . Chronic anemia   . Acute systolic CHF (congestive heart failure) ([place]) 11/09/2017  . History of right MCA stroke 09/28/2017  . Stroke ([place]) 09/28/2017  . NICM (nonischemic cardiomyopathy) ([place]) 01/11/2016  . CKD (chronic kidney disease) stage 2, GFR 60-89 ml/min 01/11/2016  . Cocaine abuse ([place]) 01/11/2016  . Chest pain, pleuritic 01/03/2016  . Tobacco abuse 01/03/2016  . Asthma 01/03/2016  . Leg swelling 01/03/2016  . Tachycardia 01/03/2016  . Normocytic anemia 01/03/2016  . Elevated troponin I level 01/03/2016  . Right rib fracture 01/03/2016  . Chest pain     Palliative Care Assessment & Plan   Patient Profile: 43 y.o. 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  Palliative consulted for goals of care.  I know Mark [name] from prior admission in May of this year.  Recommendations/Plan: - Plan for further palliative conversations as part of workup for LVAD. - He would like to wait until after heart cath to discuss further as conversation depends on his eligibility for LVAD.  Plan for f/u following results of heart cath.  Goals of Care and Additional Recommendations:  Limitations on Scope of Treatment: Full Scope Treatment  Code Status:    Code Status Orders  (From admission, onward)         Start     Ordered   06/09/19 1435  Full code  Continuous     06/09/19 1436        Code Status History    Date Active Date Inactive Code Status Order ID Comments User Context  05/09/2019 0439 05/13/2019 1745 Full Code [id number]  Larey Dresser, MD ED   04/09/2019 1528 04/13/2019 1548 Full Code [id number]  Jolaine Artist, MD Inpatient   04/09/2019 1528 04/09/2019 1528 Full Code  [id number]  Conrad Wellington, NP Inpatient   02/14/2019 0208 02/18/2019 1656 Full Code [id number]  Jani Gravel, MD ED   01/25/2019 0142 01/25/2019 1630 Full Code [id number]  Elwyn Reach, MD Inpatient   12/25/2018 0028 12/30/2018 1922 Full Code [id number]  Lenore Cordia, MD ED   11/09/2017 2159 11/14/2017 2051 Full Code [id number]  Rise Patience, MD Inpatient   09/28/2017 0747 10/01/2017 2020 Full Code [id number]  Radene Gunning, NP ED   09/20/2017 2322 09/22/2017 1655 Full Code [id number]  Vianne Bulls, MD ED   02/07/2016 1044 02/08/2016 1802 Full Code [id number]  Norman Herrlich, MD ED   01/03/2016 0520 01/06/2016 1517 Full Code [id number]  Vianne Bulls, MD ED   Advance Care Planning Activity       Prognosis:   Guarded  Discharge Planning:  To Be Determined  Care plan was discussed with patient, RN  Thank you for allowing the Palliative Medicine Team to assist in the care of this patient.   Time In: 1810 Time Out: 1830 Total Time 20 Prolonged Time Billed No      Greater than 50%  of this time was spent counseling and coordinating care related to the above assessment and plan.  Micheline Rough, MD  Please contact Palliative Medicine Team phone at [phone] for questions and concerns.

## 2019-06-17 ENCOUNTER — Telehealth: Payer: Self-pay

## 2019-06-17 ENCOUNTER — Encounter (HOSPITAL_COMMUNITY)
Admission: EM | Disposition: A | Discharge status: Home / Self Care | Payer: Self-pay | Source: Home / Self Care | Attending: Internal Medicine

## 2019-06-17 ENCOUNTER — Other Ambulatory Visit (HOSPITAL_COMMUNITY): Payer: Medicaid Other

## 2019-06-17 ENCOUNTER — Encounter (HOSPITAL_COMMUNITY): Payer: Self-pay | Admitting: Internal Medicine

## 2019-06-17 DIAGNOSIS — I5023 Acute on chronic systolic (congestive) heart failure: Secondary | ICD-10-CM

## 2019-06-17 HISTORY — PX: RIGHT HEART CATH: CATH118263

## 2019-06-17 LAB — COOXEMETRY PANEL
Carboxyhemoglobin: 1.3 % (ref 0.5–1.5)
Methemoglobin: 1.1 % (ref 0.0–1.5)
O2 Saturation: 49.6 %
Total hemoglobin: 13.5 g/dL (ref 12.0–16.0)

## 2019-06-17 LAB — CBC
HCT: 31.9 % — ABNORMAL LOW (ref 39.0–52.0)
Hemoglobin: 10 g/dL — ABNORMAL LOW (ref 13.0–17.0)
MCH: 25.4 pg — ABNORMAL LOW (ref 26.0–34.0)
MCHC: 31.3 g/dL (ref 30.0–36.0)
MCV: 81.2 fL (ref 80.0–100.0)
Platelets: 254 10*3/uL (ref 150–400)
RBC: 3.93 MIL/uL — ABNORMAL LOW (ref 4.22–5.81)
RDW: 16.9 % — ABNORMAL HIGH (ref 11.5–15.5)
WBC: 5.6 10*3/uL (ref 4.0–10.5)
nRBC: 0 % (ref 0.0–0.2)

## 2019-06-17 LAB — BASIC METABOLIC PANEL
Anion gap: 10 (ref 5–15)
BUN: 25 mg/dL — ABNORMAL HIGH (ref 6–20)
CO2: 28 mmol/L (ref 22–32)
Calcium: 8.9 mg/dL (ref 8.9–10.3)
Chloride: 98 mmol/L (ref 98–111)
Creatinine, Ser: 1.34 mg/dL — ABNORMAL HIGH (ref 0.61–1.24)
GFR calc Af Amer: >60 mL/min (ref >60)
GFR calc non Af Amer: >60 mL/min (ref >60)
Glucose, Bld: 105 mg/dL — ABNORMAL HIGH (ref 70–99)
Potassium: 4.3 mmol/L (ref 3.5–5.1)
Sodium: 136 mmol/L (ref 135–145)

## 2019-06-17 LAB — POCT I-STAT EG7
Acid-Base Excess: 2 mmol/L (ref 0.0–2.0)
Acid-Base Excess: 4 mmol/L — ABNORMAL HIGH (ref 0.0–2.0)
Bicarbonate: 29.5 mmol/L — ABNORMAL HIGH (ref 20.0–28.0)
Bicarbonate: 30.7 mmol/L — ABNORMAL HIGH (ref 20.0–28.0)
Calcium, Ion: 1.16 mmol/L (ref 1.15–1.40)
Calcium, Ion: 1.29 mmol/L (ref 1.15–1.40)
HCT: 33 % — ABNORMAL LOW (ref 39.0–52.0)
HCT: 35 % — ABNORMAL LOW (ref 39.0–52.0)
Hemoglobin: 11.2 g/dL — ABNORMAL LOW (ref 13.0–17.0)
Hemoglobin: 11.9 g/dL — ABNORMAL LOW (ref 13.0–17.0)
O2 Saturation: 54 %
O2 Saturation: 58 %
Potassium: 4.2 mmol/L (ref 3.5–5.1)
Potassium: 4.6 mmol/L (ref 3.5–5.1)
Sodium: 139 mmol/L (ref 135–145)
Sodium: 142 mmol/L (ref 135–145)
TCO2: 31 mmol/L (ref 22–32)
TCO2: 32 mmol/L (ref 22–32)
pCO2, Ven: 56.4 mmHg (ref 44.0–60.0)
pCO2, Ven: 57.7 mmHg (ref 44.0–60.0)
pH, Ven: 7.326 (ref 7.250–7.430)
pH, Ven: 7.334 (ref 7.250–7.430)
pO2, Ven: 31 mmHg — CL (ref 32.0–45.0)
pO2, Ven: 33 mmHg (ref 32.0–45.0)

## 2019-06-17 LAB — PROTIME-INR
INR: 2.7 — ABNORMAL HIGH (ref 0.8–1.2)
Prothrombin Time: 28 seconds — ABNORMAL HIGH (ref 11.4–15.2)

## 2019-06-17 LAB — MAGNESIUM: Magnesium: 1.6 mg/dL — ABNORMAL LOW (ref 1.7–2.4)

## 2019-06-17 SURGERY — RIGHT HEART CATH
Anesthesia: LOCAL

## 2019-06-17 MED ORDER — FENTANYL CITRATE (PF) 100 MCG/2ML IJ SOLN
INTRAMUSCULAR | Status: AC
  Filled 2019-06-17: qty 2

## 2019-06-17 MED ORDER — SODIUM CHLORIDE 0.9 % IV SOLN: 250.0000 mL | INTRAVENOUS | Status: DC | PRN

## 2019-06-17 MED ORDER — SODIUM CHLORIDE 0.9 % IV SOLN
INTRAVENOUS | Status: DC
  Administered 2019-06-17: 05:00:00 via INTRAVENOUS

## 2019-06-17 MED ORDER — LIDOCAINE HCL (PF) 1 % IJ SOLN
INTRAMUSCULAR | Status: AC
  Filled 2019-06-17: qty 30

## 2019-06-17 MED ORDER — SODIUM CHLORIDE 0.9% FLUSH: 3.0000 mL | INTRAVENOUS | Status: DC | PRN

## 2019-06-17 MED ORDER — ASPIRIN EC 81 MG PO TBEC
81.0000 mg | DELAYED_RELEASE_TABLET | Freq: Every day | ORAL | Status: DC
  Administered 2019-06-18: 10:00:00 81 mg via ORAL
  Filled 2019-06-17: qty 1

## 2019-06-17 MED ORDER — ASPIRIN 81 MG PO CHEW
81.0000 mg | CHEWABLE_TABLET | ORAL | Status: AC
  Administered 2019-06-17: 05:00:00 81 mg via ORAL
  Filled 2019-06-17: qty 1

## 2019-06-17 MED ORDER — HEPARIN (PORCINE) IN NACL 1000-0.9 UT/500ML-% IV SOLN
INTRAVENOUS | Status: AC
  Filled 2019-06-17: qty 500

## 2019-06-17 MED ORDER — HEPARIN (PORCINE) IN NACL 1000-0.9 UT/500ML-% IV SOLN
INTRAVENOUS | Status: DC | PRN
  Administered 2019-06-17: 500 mL

## 2019-06-17 MED ORDER — LIDOCAINE HCL (PF) 1 % IJ SOLN
INTRAMUSCULAR | Status: DC | PRN
  Administered 2019-06-17: 2 mL

## 2019-06-17 MED ORDER — HEPARIN (PORCINE) IN NACL 1000-0.9 UT/500ML-% IV SOLN: INTRAVENOUS | Status: DC | PRN

## 2019-06-17 MED ORDER — MAGNESIUM SULFATE 2 GM/50ML IV SOLN
2.0000 g | Freq: Once | INTRAVENOUS | Status: AC
  Administered 2019-06-17: 2 g via INTRAVENOUS
  Filled 2019-06-17: qty 50

## 2019-06-17 MED ORDER — MIDAZOLAM HCL 2 MG/2ML IJ SOLN
INTRAMUSCULAR | Status: AC
  Filled 2019-06-17: qty 2

## 2019-06-17 MED ORDER — MIDAZOLAM HCL 2 MG/2ML IJ SOLN
INTRAMUSCULAR | Status: DC | PRN
  Administered 2019-06-17: 1 mg via INTRAVENOUS

## 2019-06-17 MED ORDER — FENTANYL CITRATE (PF) 100 MCG/2ML IJ SOLN
INTRAMUSCULAR | Status: DC | PRN
  Administered 2019-06-17: 25 ug via INTRAVENOUS

## 2019-06-17 SURGICAL SUPPLY — 9 items
CATH BALLN WEDGE 5F 110CM (CATHETERS) ×1 IMPLANT
GUIDEWIRE .025 260CM (WIRE) ×1 IMPLANT
PACK CARDIAC CATHETERIZATION (CUSTOM PROCEDURE TRAY) ×2 IMPLANT
PROTECTION STATION PRESSURIZED (MISCELLANEOUS) ×2
SHEATH GLIDE SLENDER 4/5FR (SHEATH) ×1 IMPLANT
STATION PROTECTION PRESSURIZED (MISCELLANEOUS) IMPLANT
TRANSDUCER W/STOPCOCK (MISCELLANEOUS) ×2 IMPLANT
TUBING ART PRESS 72  MALE/FEM (TUBING) ×1
TUBING ART PRESS 72 MALE/FEM (TUBING) IMPLANT

## 2019-06-17 NOTE — Telephone Encounter (Signed)
Reviewing chart to follow up with referral to Palliative care. Noted patient is in the hospital.

## 2019-06-17 NOTE — Progress Notes (Signed)
PROGRESS NOTE    Mark [name]  UJW:[id number] DOB: 03/29/1976 DOA: 06/09/2019 PCP: Grayce SessionsEdwards, Michelle P, NP     Brief Narrative:  Mark [name] is a 43 year old male with past medical history of chronic systolic heart failure (EF 10 to 15% with left apical thrombus with severely diffuse hypokinesis 12/2018) NICM by cardiac cath in 2017, history of right MCA CVA 2019, recurrent cardiogenic shock, asthma, polysubstance abuse who presented to the ED on 10/18 with complaints of productive cough, fever and body aches x2 weeks and found to have WBC 13 with chest x-ray with right middle lobe pneumonia, Covid negative and on admission his weight was up 13 pounds from previous discharge.  He was started on Vanco/cefepime in ED and changed to Rocephin/azithromycin for CAP therapy and continued on remaining home meds.  Overnight 10/18-10/19 a rapid response for hypotension was called after patient received his home BP meds and patient received 250 cc bolus NS with slightly improved BP however again had drop in blood pressure.  He received albumin x2 for recurrent hypotension throughout the same day after his blood pressure meds were held.  He remained hypotensive but asymptomatic.  Cardiology was consulted who deemed patient not a candidate for advanced heart failure therapies due to RV dysfunction substance abuse and palliative care was consulted.  Patient was concerned about having an adverse reaction to azithromycin and was changed to doxycycline/Rocephin for CAP coverage, then eventually deescalated to cefdinir, doxycycline.   New events last 24 hours / Subjective: Heart cath today.  No new complaints  Assessment & Plan:   Principal Problem:   Acute on chronic systolic (congestive) heart failure (HCC) Active Problems:   Tobacco abuse   Asthma   NICM (nonischemic cardiomyopathy) (HCC)   History of right MCA stroke   Right middle lobe pneumonia   Polysubstance abuse (HCC)   Chronic hyponatremia    Strep pneumonia CAP in setting of right middle lobe pneumonia  -Positive S pneumo urinary antigen, blood cultures with no growth to date, Covid negative.  Completed antibiotics 10/18-10/22 for total 5 day course.   Acute on chronic biventricular heart failure with low output  -History of nonischemic cardiomyopathy.  EF 5 to 10% in May 2020, RV severely dysfunctional.  Had RHC in August with low output and high filling pressure and required inotropic support, previously on milrinone while hospitalized but refused home milrinone.  Not a candidate for LVAD with right heart failure.  BNP elevated but at/below baseline.  Weight up but asymptomatic.  Ran out of spironolactone 2 days prior to admit.  Cardiology consulted -Continue to hold Losartan -Continue digoxin, spironolactone, lasix IV, milrinone gtt  -Evaluation for VAD in process, underwent heart cath 10/26  Hypotension  -Improved  Apical thrombus  -Continue Coumadin   AKI  -Resolved  Chronic hyponatremia secondary to chronic heart failure  -Resolved  Asthma  -Well controlled. Monitor  Polysubstance abuse  -UDS negative, and history of cocaine in the past  MCA CVA 2019  -No residual weakness.   Continue Coumadin, aspirin  Tobacco  -Cessation counseling     DVT prophylaxis: Coumadin Code Status: Full Family Communication: None at bedside Disposition Plan: Pending further heart failure team recommendations. On milrinone gtt currently.    Consultants:   Advanced heart failure  Palliative care   Procedures:   Heart cath 10/26 Severe biventricular failure with elevated filling pressures and moderately reduced CO on milrinone 0.375   Antimicrobials:  Anti-infectives (From admission, onward)   Start  Dose/Rate Route Frequency Ordered Stop   06/11/19 1000  cefdinir (OMNICEF) capsule 300 mg  Status:  Discontinued    Note to Pharmacy: Has been tolerating cephalosporins   300 mg Oral Every 12 hours  06/11/19 0804 06/11/19 0804   06/11/19 1000  cefdinir (OMNICEF) capsule 300 mg    Note to Pharmacy: Has been tolerating cephalosporins   300 mg Oral Every 12 hours 06/11/19 0804 06/13/19 2135   06/10/19 2200  doxycycline (VIBRA-TABS) tablet 100 mg     100 mg Oral Every 12 hours 06/10/19 1607 06/13/19 2135   06/10/19 0500  vancomycin (VANCOCIN) IVPB 750 mg/150 ml premix  Status:  Discontinued     750 mg 150 mL/hr over 60 Minutes Intravenous Every 12 hours 06/09/19 1402 06/09/19 1629   06/09/19 2300  ceFEPIme (MAXIPIME) 2 g in sodium chloride 0.9 % 100 mL IVPB  Status:  Discontinued     2 g 200 mL/hr over 30 Minutes Intravenous Every 8 hours 06/09/19 1400 06/09/19 1629   06/09/19 2300  cefTRIAXone (ROCEPHIN) 1 g in sodium chloride 0.9 % 100 mL IVPB  Status:  Discontinued     1 g 200 mL/hr over 30 Minutes Intravenous Every 24 hours 06/09/19 1628 06/11/19 0804   06/09/19 1630  azithromycin (ZITHROMAX) 500 mg in sodium chloride 0.9 % 250 mL IVPB  Status:  Discontinued     500 mg 250 mL/hr over 60 Minutes Intravenous Every 24 hours 06/09/19 1628 06/10/19 1607   06/09/19 1400  ceFEPIme (MAXIPIME) 2 g in sodium chloride 0.9 % 100 mL IVPB     2 g 200 mL/hr over 30 Minutes Intravenous  Once 06/09/19 1359 06/09/19 1521   06/09/19 1400  vancomycin (VANCOCIN) 1,250 mg in sodium chloride 0.9 % 250 mL IVPB  Status:  Discontinued     1,250 mg 166.7 mL/hr over 90 Minutes Intravenous  Once 06/09/19 1359 06/09/19 1700       Objective: Vitals:   06/17/19 0342 06/17/19 0731 06/17/19 0933 06/17/19 0935  BP: 114/78  119/84   Pulse: (!) 54   (!) 113  Resp: 20     Temp: 98.2 F (36.8 C)     TempSrc: Oral     SpO2: 100% 98%    Weight:      Height:        Intake/Output Summary (Last 24 hours) at 06/17/2019 1112 Last data filed at 06/17/2019 0700 Gross per 24 hour  Intake 769.42 ml  Output 3650 ml  Net -2880.58 ml   Filed Weights   06/15/19 0341 06/16/19 0657 06/17/19 0134  Weight: 63.4 kg  65.3 kg 64.5 kg    Examination: General exam: Appears calm and comfortable  Respiratory system: Clear to auscultation. Respiratory effort normal. Cardiovascular system: S1 & S2 heard, tachycardic, regular rhythm. No pedal edema. Gastrointestinal system: Abdomen is nondistended, soft and nontender. Normal bowel sounds heard. Central nervous system: Alert and oriented. Non focal exam. Speech clear  Extremities: Symmetric in appearance bilaterally  Skin: No rashes, lesions or ulcers on exposed skin  Psychiatry: Judgement and insight appear stable. Mood & affect appropriate.    Data Reviewed: I have personally reviewed following labs and imaging studies  CBC: Recent Labs  Lab 06/11/19 0254 06/12/19 0448 06/13/19 0500 06/15/19 0421 06/17/19 0430  WBC 6.2 5.8 4.7 5.2 5.6  HGB 11.0* 11.1* 10.0* 10.2* 10.0*  HCT 33.2* 35.1* 31.8* 32.3* 31.9*  MCV 79.6* 80.3 81.5 80.8 81.2  PLT 206 230 199 251 254  Basic Metabolic Panel: Recent Labs  Lab 06/13/19 0500 06/14/19 0432 06/15/19 0421 06/16/19 0510 06/17/19 0430  NA 132* 133* 135 136 136  K 4.1 3.8 3.7 4.2 4.3  CL 96* 94* 95* 99 98  CO2 27 32 29 29 28   GLUCOSE 98 110* 109* 95 105*  BUN 21* 17 15 20  25*  CREATININE 1.06 1.04 1.21 1.09 1.34*  CALCIUM 8.7* 8.9 8.9 8.8* 8.9  MG  --   --   --   --  1.6*   GFR: Estimated Creatinine Clearance: 63.5 mL/min (A) (by C-G formula based on SCr of 1.34 mg/dL (H)). Liver Function Tests: No results for input(s): AST, ALT, ALKPHOS, BILITOT, PROT, ALBUMIN in the last 168 hours. No results for input(s): LIPASE, AMYLASE in the last 168 hours. No results for input(s): AMMONIA in the last 168 hours. Coagulation Profile: Recent Labs  Lab 06/13/19 0500 06/14/19 0432 06/15/19 0421 06/16/19 0510 06/17/19 0430  INR 2.9* 2.9* 2.9* 2.5* 2.7*   Cardiac Enzymes: No results for input(s): CKTOTAL, CKMB, CKMBINDEX, TROPONINI in the last 168 hours. BNP (last 3 results) No results for input(s):  PROBNP in the last 8760 hours. HbA1C: No results for input(s): HGBA1C in the last 72 hours. CBG: Recent Labs  Lab 06/10/19 1153  GLUCAP 148*   Lipid Profile: No results for input(s): CHOL, HDL, LDLCALC, TRIG, CHOLHDL, LDLDIRECT in the last 72 hours. Thyroid Function Tests: No results for input(s): TSH, T4TOTAL, FREET4, T3FREE, THYROIDAB in the last 72 hours. Anemia Panel: No results for input(s): VITAMINB12, FOLATE, FERRITIN, TIBC, IRON, RETICCTPCT in the last 72 hours. Sepsis Labs: Recent Labs  Lab 06/10/19 1417  LATICACIDVEN 1.4    Recent Results (from the past 240 hour(s))  SARS CORONAVIRUS 2 (TAT 6-24 HRS) Nasopharyngeal Nasopharyngeal Swab     Status: None   Collection Time: 06/09/19  1:30 PM   Specimen: Nasopharyngeal Swab  Result Value Ref Range Status   SARS Coronavirus 2 NEGATIVE NEGATIVE Final    Comment: (NOTE) SARS-CoV-2 target nucleic acids are NOT DETECTED. The SARS-CoV-2 RNA is generally detectable in upper and lower respiratory specimens during the acute phase of infection. Negative results do not preclude SARS-CoV-2 infection, do not rule out co-infections with other pathogens, and should not be used as the sole basis for treatment or other patient management decisions. Negative results must be combined with clinical observations, patient history, and epidemiological information. The expected result is Negative. Fact Sheet for Patients: HairSlick.nohttps://www.fda.gov/media/138098/download Fact Sheet for Healthcare Providers: quierodirigir.comhttps://www.fda.gov/media/138095/download This test is not yet approved or cleared by the [place] FDA and  has been authorized for detection and/or diagnosis of SARS-CoV-2 by FDA under an Emergency Use Authorization (EUA). This EUA will remain  in effect (meaning this test can be used) for the duration of the COVID-19 declaration under Section 56 4(b)(1) of the Act, 21 U.S.C. section 360bbb-3(b)(1), unless the authorization is  terminated or revoked sooner. Performed at [facility], [address] [place], [place] [address]   Culture, blood (routine x 2)     Status: None   Collection Time: 06/09/19  3:40 PM   Specimen: BLOOD LEFT FOREARM  Result Value Ref Range Status   Specimen Description BLOOD LEFT FOREARM  Final   Special Requests   Final    BOTTLES DRAWN AEROBIC AND ANAEROBIC Blood Culture adequate volume   Culture   Final    NO GROWTH 5 DAYS Performed at [facility], [address] [place], [place]  [address]    Report Status 06/14/2019 FINAL  Final  Culture, blood (routine x 2)     Status: None   Collection Time: 06/09/19  4:38 PM   Specimen: BLOOD RIGHT HAND  Result Value Ref Range Status   Specimen Description BLOOD RIGHT HAND  Final   Special Requests   Final    BOTTLES DRAWN AEROBIC AND ANAEROBIC Blood Culture results may not be optimal due to an inadequate volume of blood received in culture bottles   Culture   Final    NO GROWTH 5 DAYS Performed at [facility], [place] [address], [place], [place] [address]    Report Status 06/14/2019 FINAL  Final  Culture, sputum-assessment     Status: None   Collection Time: 06/10/19  6:55 PM   Specimen: Expectorated Sputum  Result Value Ref Range Status   Specimen Description EXPECTORATED SPUTUM  Final   Special Requests NONE  Final   Sputum evaluation   Final    THIS SPECIMEN IS ACCEPTABLE FOR SPUTUM CULTURE Performed at [facility], [place] [address], [place], [place] [address]    Report Status 06/10/2019 FINAL  Final  MRSA PCR Screening     Status: None   Collection Time: 06/10/19  6:55 PM   Specimen: Nasal Mucosa; Nasopharyngeal  Result Value Ref Range Status   MRSA by PCR NEGATIVE NEGATIVE Final    Comment:        The GeneXpert MRSA Assay (FDA approved for NASAL specimens only), is one component of a comprehensive MRSA colonization surveillance program. It is not intended to diagnose MRSA infection nor to guide  or monitor treatment for MRSA infections. Performed at [facility], [place] [address], [place], [place] [address]   Culture, respiratory     Status: None   Collection Time: 06/10/19  6:55 PM  Result Value Ref Range Status   Specimen Description EXPECTORATED SPUTUM  Final   Special Requests NONE Reflexed from [id number]  Final   Gram Stain   Final    ABUNDANT WBC PRESENT,BOTH PMN AND MONONUCLEAR RARE GRAM VARIABLE ROD RARE GRAM POSITIVE COCCI Performed at [facility], [place] [address] [place], [place] [address]    Culture RARE ESCHERICHIA COLI  Final   Report Status 06/13/2019 FINAL  Final   Organism ID, Bacteria ESCHERICHIA COLI  Final      Susceptibility   Escherichia coli - MIC*    AMPICILLIN >=32 RESISTANT Resistant     CEFAZOLIN >=64 RESISTANT Resistant     CEFEPIME <=1 SENSITIVE Sensitive     CEFTAZIDIME 4 SENSITIVE Sensitive     CEFTRIAXONE 8 SENSITIVE Sensitive     CIPROFLOXACIN <=0.25 SENSITIVE Sensitive     GENTAMICIN <=1 SENSITIVE Sensitive     IMIPENEM <=0.25 SENSITIVE Sensitive     TRIMETH/SULFA <=20 SENSITIVE Sensitive     AMPICILLIN/SULBACTAM >=32 RESISTANT Resistant     PIP/TAZO <=4 SENSITIVE Sensitive     Extended ESBL NEGATIVE Sensitive     * RARE ESCHERICHIA COLI      Radiology Studies: No results found.    Scheduled Meds: . [START ON 06/18/2019] aspirin EC  81 mg Oral Daily  . Chlorhexidine Gluconate Cloth  6 each Topical Daily  . digoxin  0.125 mg Oral Daily  . furosemide  80 mg Intravenous BID  . ipratropium  0.5 mg Nebulization BID  . mometasone-formoterol  2 puff Inhalation BID  . sodium chloride flush  3 mL Intravenous Q12H  .  spironolactone  25 mg Oral Daily  . warfarin  2.5 mg Oral Q Tue-1800  . warfarin  5 mg Oral Once per day on Sun Mon Wed Thu Fri Sat  . Warfarin - Pharmacist Dosing Inpatient   Does not apply q1800   Continuous Infusions: . milrinone 0.375 mcg/kg/min (06/17/19 0944)     LOS: 8 days      Time spent: 25  minutes   Noralee Stain, DO [facility] 06/17/2019, 11:12 AM   Available via Epic secure chat 7am-7pm After these hours, please refer to coverage provider listed on [email or web address]

## 2019-06-17 NOTE — H&P (View-Only) (Signed)
Advanced Heart Failure Rounding Note  PCP-Cardiologist: Lauree Chandler, MD   Subjective:    Remains on 0.375 mcg. CO-OX 49.6%  Denies CP or SOB. Diuresing with IV lasix.   For RHC today   Objective:   Weight Range: 64.5 kg Body mass index is 21 kg/m.   Vital Signs:   Temp:  [97.5 F (36.4 C)-98.5 F (36.9 C)] 98.2 F (36.8 C) (10/26 0342) Pulse Rate:  [54-109] 54 (10/26 0342) Resp:  [20] 20 (10/26 0342) BP: (102-114)/(65-78) 114/78 (10/26 0342) SpO2:  [98 %-100 %] 98 % (10/26 0731) Weight:  [64.5 kg] 64.5 kg (10/26 0134) Last BM Date: 06/15/19  Weight change: Filed Weights   06/15/19 0341 06/16/19 0657 06/17/19 0134  Weight: 63.4 kg 65.3 kg 64.5 kg    Intake/Output:   Intake/Output Summary (Last 24 hours) at 06/17/2019 0741 Last data filed at 06/17/2019 0700 Gross per 24 hour  Intake 1009.42 ml  Output 4550 ml  Net -3540.58 ml      Physical Exam   General:  Sitting up in bed No resp difficulty HEENT: normal Neck: supple. JVP 10 . Carotids 2+ bilat; no bruits. No lymphadenopathy or thryomegaly appreciated. Cor: PMI nondisplaced. Tachy + s3. No rubs, gallops or murmurs. Lungs: clear Abdomen: soft, nontender, nondistended. No hepatosplenomegaly. No bruits or masses. Good bowel sounds. Extremities: no cyanosis, clubbing, rash, edema Neuro: alert & orientedx3, cranial nerves grossly intact. moves all 4 extremities w/o difficulty. Affect pleasant   Telemetry   ST 100-110 Personally reviewed  EKG    N/A  Labs    CBC Recent Labs    06/15/19 0421 06/17/19 0430  WBC 5.2 5.6  HGB 10.2* 10.0*  HCT 32.3* 31.9*  MCV 80.8 81.2  PLT 251 233   Basic Metabolic Panel Recent Labs    06/16/19 0510 06/17/19 0430  NA 136 136  K 4.2 4.3  CL 99 98  CO2 29 28  GLUCOSE 95 105*  BUN 20 25*  CREATININE 1.09 1.34*  CALCIUM 8.8* 8.9  MG  --  1.6*   Liver Function Tests No results for input(s): AST, ALT, ALKPHOS, BILITOT, PROT, ALBUMIN in the  last 72 hours. No results for input(s): LIPASE, AMYLASE in the last 72 hours. Cardiac Enzymes No results for input(s): CKTOTAL, CKMB, CKMBINDEX, TROPONINI in the last 72 hours.  BNP: BNP (last 3 results) Recent Labs    02/13/19 2243 05/08/19 2018 06/09/19 1140  BNP 3,902.9* 3,017.6* 2,175.3*    ProBNP (last 3 results) No results for input(s): PROBNP in the last 8760 hours.   D-Dimer No results for input(s): DDIMER in the last 72 hours. Hemoglobin A1C No results for input(s): HGBA1C in the last 72 hours. Fasting Lipid Panel No results for input(s): CHOL, HDL, LDLCALC, TRIG, CHOLHDL, LDLDIRECT in the last 72 hours. Thyroid Function Tests No results for input(s): TSH, T4TOTAL, T3FREE, THYROIDAB in the last 72 hours.  Invalid input(s): FREET3  Other results:   Imaging    No results found.   Medications:     Scheduled Medications: . [MAR Hold] aspirin EC  81 mg Oral Daily  . [MAR Hold] Chlorhexidine Gluconate Cloth  6 each Topical Daily  . [MAR Hold] digoxin  0.125 mg Oral Daily  . [MAR Hold] furosemide  80 mg Intravenous BID  . [MAR Hold] ipratropium  0.5 mg Nebulization BID  . [MAR Hold] mometasone-formoterol  2 puff Inhalation BID  . [MAR Hold] sodium chloride flush  3 mL Intravenous Q12H  . [  MAR Hold] spironolactone  25 mg Oral Daily  . [MAR Hold] warfarin  2.5 mg Oral Q Tue-1800  . [MAR Hold] warfarin  5 mg Oral Once per day on Sun Mon Wed Thu Fri Sat  . [MAR Hold] Warfarin - Pharmacist Dosing Inpatient   Does not apply q1800    Infusions: . sodium chloride    . sodium chloride 10 mL/hr at 06/17/19 0506  . 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 Has had several recent admits for cardiogenic shock  supported with milrinone but has refused home inotropes. Now readmitted with RML PNA and recurrent low output HF and lactic acidosis. He is not candidate for advanced therapies due to severe RV dysfunction and substance abuse.   Assessment/Plan   1. Rt Middle Lobe PNA: COVID negative. Step Pneumo +. Blood cultures negative. Received IV Vanco and cefepime in ED>>changed to cefdinir + doxycycline.  - Has completed abx  2. Acute on Chronic Biventricular Heart Failure w/ Low Output: History of nonischemic cardiomyopathy. Lastecho  5/20 showed that EF is markedly low at 5-10% with LV apical thrombus. The RV is severely dysfunctional as well. RHC in 8/20 with low output and high filling pressures. He required inotropic support previous 2 admits (initial co-ox in the 30s>>improved to 63% w/ milrinone) but refuses home milrinone. Not a candidate for LVAD w/ severe RV failure and substance abuse.  -We discussed home inotropes but he continues to refuse. We have consulted palliative care to see today to discuss hospice.   -He has spoken to the Hospice team and has told them he is now interested in LVAD.  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 He has also previously refused VAD saying he would continue to do anything he wants to do and not be able to comply with what was needed to take care of a VAD. -Now wants to proceed with VAD w/u. He understands  RV dysfunction may be prohibitive but we will perform RHC on milrinone tomorrow and get echo to see if RV is recruitable.  - Co-ox remains low at 49% on milrinone 0.375 mcg.  - Volume status improved on IV lasix but CVP still 10 Renal function stable.  - Continue digoxin and spiro  - VAD work up underway. - Plan RHC this am. Repeat echo later today.   3. Apical Thrombus: Identified on ECHO 12/2018.  -on coumadin. INR stable at  2.5 today  No bleeding. Discussed dosing with PharmD personally.   4. H/O MCA CVA 2019 :  - Continue atorvastatin 40 mg  daily. - Continue Coumadin + aspirin 81 mg daily.  - INR therapeutic at 2.5   5. AKI: Scr 1.30 on admit. (baseline ~1.0) - suspect 2/2 low output HF and hypotension  - previously required milrinone last 2 admits but refuses home ionotropes -Creatinine up slightly 1.0 -> 1.1 -> 1.3 - Watch with diuresis  6. CODE STATUS: Full code for now. See discussion above  Length of Stay: 8  Arvilla Meres, MD  06/17/2019, 7:41 AM  Advanced Heart Failure Team Pager [phone] (M-F; 7a - 4p)  Please contact [facility] for night-coverage after hours (4p -7a ) and weekends on [email or web address]

## 2019-06-17 NOTE — Progress Notes (Signed)
Advanced Heart Failure Rounding Note  PCP-Cardiologist: Lauree Chandler, MD   Subjective:    Remains on 0.375 mcg. CO-OX 49.6%  Denies CP or SOB. Diuresing with IV lasix.   For RHC today   Objective:   Weight Range: 64.5 kg Body mass index is 21 kg/m.   Vital Signs:   Temp:  [97.5 F (36.4 C)-98.5 F (36.9 C)] 98.2 F (36.8 C) (10/26 0342) Pulse Rate:  [54-109] 54 (10/26 0342) Resp:  [20] 20 (10/26 0342) BP: (102-114)/(65-78) 114/78 (10/26 0342) SpO2:  [98 %-100 %] 98 % (10/26 0731) Weight:  [64.5 kg] 64.5 kg (10/26 0134) Last BM Date: 06/15/19  Weight change: Filed Weights   06/15/19 0341 06/16/19 0657 06/17/19 0134  Weight: 63.4 kg 65.3 kg 64.5 kg    Intake/Output:   Intake/Output Summary (Last 24 hours) at 06/17/2019 0741 Last data filed at 06/17/2019 0700 Gross per 24 hour  Intake 1009.42 ml  Output 4550 ml  Net -3540.58 ml      Physical Exam   General:  Sitting up in bed No resp difficulty HEENT: normal Neck: supple. JVP 10 . Carotids 2+ bilat; no bruits. No lymphadenopathy or thryomegaly appreciated. Cor: PMI nondisplaced. Tachy + s3. No rubs, gallops or murmurs. Lungs: clear Abdomen: soft, nontender, nondistended. No hepatosplenomegaly. No bruits or masses. Good bowel sounds. Extremities: no cyanosis, clubbing, rash, edema Neuro: alert & orientedx3, cranial nerves grossly intact. moves all 4 extremities w/o difficulty. Affect pleasant   Telemetry   ST 100-110 Personally reviewed  EKG    N/A  Labs    CBC Recent Labs    06/15/19 0421 06/17/19 0430  WBC 5.2 5.6  HGB 10.2* 10.0*  HCT 32.3* 31.9*  MCV 80.8 81.2  PLT 251 233   Basic Metabolic Panel Recent Labs    06/16/19 0510 06/17/19 0430  NA 136 136  K 4.2 4.3  CL 99 98  CO2 29 28  GLUCOSE 95 105*  BUN 20 25*  CREATININE 1.09 1.34*  CALCIUM 8.8* 8.9  MG  --  1.6*   Liver Function Tests No results for input(s): AST, ALT, ALKPHOS, BILITOT, PROT, ALBUMIN in the  last 72 hours. No results for input(s): LIPASE, AMYLASE in the last 72 hours. Cardiac Enzymes No results for input(s): CKTOTAL, CKMB, CKMBINDEX, TROPONINI in the last 72 hours.  BNP: BNP (last 3 results) Recent Labs    02/13/19 2243 05/08/19 2018 06/09/19 1140  BNP 3,902.9* 3,017.6* 2,175.3*    ProBNP (last 3 results) No results for input(s): PROBNP in the last 8760 hours.   D-Dimer No results for input(s): DDIMER in the last 72 hours. Hemoglobin A1C No results for input(s): HGBA1C in the last 72 hours. Fasting Lipid Panel No results for input(s): CHOL, HDL, LDLCALC, TRIG, CHOLHDL, LDLDIRECT in the last 72 hours. Thyroid Function Tests No results for input(s): TSH, T4TOTAL, T3FREE, THYROIDAB in the last 72 hours.  Invalid input(s): FREET3  Other results:   Imaging    No results found.   Medications:     Scheduled Medications: . [MAR Hold] aspirin EC  81 mg Oral Daily  . [MAR Hold] Chlorhexidine Gluconate Cloth  6 each Topical Daily  . [MAR Hold] digoxin  0.125 mg Oral Daily  . [MAR Hold] furosemide  80 mg Intravenous BID  . [MAR Hold] ipratropium  0.5 mg Nebulization BID  . [MAR Hold] mometasone-formoterol  2 puff Inhalation BID  . [MAR Hold] sodium chloride flush  3 mL Intravenous Q12H  . [  MAR Hold] spironolactone  25 mg Oral Daily  . [MAR Hold] warfarin  2.5 mg Oral Q Tue-1800  . [MAR Hold] warfarin  5 mg Oral Once per day on Sun Mon Wed Thu Fri Sat  . [MAR Hold] Warfarin - Pharmacist Dosing Inpatient   Does not apply q1800    Infusions: . sodium chloride    . sodium chloride 10 mL/hr at 06/17/19 0506  . 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 Has had several recent admits for cardiogenic shock  supported with milrinone but has refused home inotropes. Now readmitted with RML PNA and recurrent low output HF and lactic acidosis. He is not candidate for advanced therapies due to severe RV dysfunction and substance abuse.   Assessment/Plan   1. Rt Middle Lobe PNA: COVID negative. Step Pneumo +. Blood cultures negative. Received IV Vanco and cefepime in ED>>changed to cefdinir + doxycycline.  - Has completed abx  2. Acute on Chronic Biventricular Heart Failure w/ Low Output: History of nonischemic cardiomyopathy. Lastecho  5/20 showed that EF is markedly low at 5-10% with LV apical thrombus. The RV is severely dysfunctional as well. RHC in 8/20 with low output and high filling pressures. He required inotropic support previous 2 admits (initial co-ox in the 30s>>improved to 63% w/ milrinone) but refuses home milrinone. Not a candidate for LVAD w/ severe RV failure and substance abuse.  -We discussed home inotropes but he continues to refuse. We have consulted palliative care to see today to discuss hospice.   -He has spoken to the Hospice team and has told them he is now interested in LVAD.  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 He has also previously refused VAD saying he would continue to do anything he wants to do and not be able to comply with what was needed to take care of a VAD. -Now wants to proceed with VAD w/u. He understands  RV dysfunction may be prohibitive but we will perform RHC on milrinone tomorrow and get echo to see if RV is recruitable.  - Co-ox remains low at 49% on milrinone 0.375 mcg.  - Volume status improved on IV lasix but CVP still 10 Renal function stable.  - Continue digoxin and spiro  - VAD work up underway. - Plan RHC this am. Repeat echo later today.   3. Apical Thrombus: Identified on ECHO 12/2018.  -on coumadin. INR stable at  2.5 today  No bleeding. Discussed dosing with PharmD personally.   4. H/O MCA CVA 2019 :  - Continue atorvastatin 40 mg  daily. - Continue Coumadin + aspirin 81 mg daily.  - INR therapeutic at 2.5   5. AKI: Scr 1.30 on admit. (baseline ~1.0) - suspect 2/2 low output HF and hypotension  - previously required milrinone last 2 admits but refuses home ionotropes -Creatinine up slightly 1.0 -> 1.1 -> 1.3 - Watch with diuresis  6. CODE STATUS: Full code for now. See discussion above  Length of Stay: 8  Karlo Goeden, MD  06/17/2019, 7:41 AM  Advanced Heart Failure Team Pager [phone] (M-F; 7a - 4p)  Please contact [facility] for night-coverage after hours (4p -7a ) and weekends on [email or web address]    

## 2019-06-17 NOTE — Plan of Care (Signed)

## 2019-06-17 NOTE — Progress Notes (Signed)
Daily Progress Note   Patient Name: Mark [name]       Date: 06/17/2019 DOB: 29-Sep-1975  Age: 43 y.o. MRN#: [id number] Attending Physician: Dessa Phi, DO Primary Care Physician: Kerin Perna, NP Admit Date: 06/09/2019  Reason for Consultation/Follow-up: Establishing goals of care  Subjective: I met again at the bedside with Mark [name].  He is in good spirits today and reports this is because he thinks the cath this morning went well.  He tells me that plan is for meeting today around 4 PM where his case will be discussed to determine if he is a candidate for LVAD moving forward.  He relays that he is hopeful this is the case and discussed things that he is hopeful to be able to do if he can get VAD and improve his functional status.  Specifically reports he misses taking his children to go to the playground and spend time outside with them.  I attempted again to initiate discussion regarding palliative care role in his care if he does qualify for LVAD.  Explained concepts related to advance care planning.  He was semiopen to conversation, but stated that he " needs to know the plan (if he qualifies for LVAD)" prior to these discussions.  Length of Stay: 8  Current Medications: Scheduled Meds:  . [START ON 06/18/2019] aspirin EC  81 mg Oral Daily  . Chlorhexidine Gluconate Cloth  6 each Topical Daily  . digoxin  0.125 mg Oral Daily  . furosemide  80 mg Intravenous BID  . ipratropium  0.5 mg Nebulization BID  . mometasone-formoterol  2 puff Inhalation BID  . sodium chloride flush  3 mL Intravenous Q12H  . spironolactone  25 mg Oral Daily  . warfarin  2.5 mg Oral Q Tue-1800  . warfarin  5 mg Oral Once per day on Sun Mon Wed Thu Fri Sat  . Warfarin - Pharmacist Dosing  Inpatient   Does not apply q1800    Continuous Infusions: . magnesium sulfate bolus IVPB 2 g (06/17/19 1755)  . milrinone 0.375 mcg/kg/min (06/17/19 0944)    PRN Meds: acetaminophen, albuterol, diphenhydrAMINE, sodium chloride flush  Physical Exam    General: Alert, awake, in no acute distress. Chronically ill appearing. Heart: Regular rate and rhythm.  Lungs: Good air movement, clear Abdomen: Soft, nontender,  nondistended, positive bowel sounds.  Ext: No significant edema Skin: Warm and dry Neuro: Grossly intact, nonfocal.       Vital Signs: BP 116/80 (BP Location: Left Arm)   Pulse 95   Temp 97.9 F (36.6 C) (Oral)   Resp 18   Ht _0  (1.753 m)   Wt 64.5 kg   SpO2 98%   BMI 21.00 kg/m  SpO2: SpO2: 98 % O2 Device: O2 Device: Room Air O2 Flow Rate: O2 Flow Rate (L/min): 0 L/min  Intake/output summary:   Intake/Output Summary (Last 24 hours) at 06/17/2019 1849 Last data filed at 06/17/2019 1230 Gross per 24 hour  Intake 409.42 ml  Output 2250 ml  Net -1840.58 ml   LBM: Last BM Date: 06/15/19 Baseline Weight: Weight: 67.2 kg Most recent weight: Weight: 64.5 kg       Palliative Assessment/Data:    Flowsheet Rows     Most Recent Value  Intake Tab  Referral Department  Cardiology  Unit at Time of Referral  Cardiac/Telemetry Unit  Palliative Care Primary Diagnosis  Cardiac  Date Notified  06/10/19  Palliative Care Type  Return patient Palliative Care  Reason for referral  Clarify Goals of Care  Date of Admission  06/09/19  Date first seen by Palliative Care  06/11/19  # of days Palliative referral response time  1 Day(s)  # of days IP prior to Palliative referral  1  Clinical Assessment  Palliative Performance Scale Score  40%  Psychosocial & Spiritual Assessment  Palliative Care Outcomes  Patient/Family meeting held?  Yes  Who was at the meeting?  Patient, wife via phone  Palliative Care Outcomes  Clarified goals of care      Patient Active  Problem List   Diagnosis Date Noted  . Right middle lobe pneumonia 06/09/2019  . Polysubstance abuse ([place]) 06/09/2019  . Chronic hyponatremia 06/09/2019  . Malnutrition of moderate degree 04/10/2019  . Cardiogenic shock ([place]) 04/09/2019  . Allergic reaction caused by a drug 02/14/2019  . Abnormal liver function 02/14/2019  . Encounter for therapeutic drug monitoring 02/13/2019  . Encounter to establish care 02/13/2019  . 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 Acute CHF (congestive heart failure) ([place]) 01/24/2019  . Shortness of breath   . Cocaine use   . Acute on chronic systolic (congestive) heart failure ([place]) 12/25/2018  . Demand ischemia ([place])   . LV (left ventricular) mural thrombus without MI ([place])   . Noncompliance   . Chronic anemia   . Acute systolic CHF (congestive heart failure) ([place]) 11/09/2017  . History of right MCA stroke 09/28/2017  . Stroke ([place]) 09/28/2017  . NICM (nonischemic cardiomyopathy) ([place]) 01/11/2016  . CKD (chronic kidney disease) stage 2, GFR 60-89 ml/min 01/11/2016  . Cocaine abuse ([place]) 01/11/2016  . Chest pain, pleuritic 01/03/2016  . Tobacco abuse 01/03/2016  . Asthma 01/03/2016  . Leg swelling 01/03/2016  . Tachycardia 01/03/2016  . Normocytic anemia 01/03/2016  . Elevated troponin I level 01/03/2016  . Right rib fracture 01/03/2016  . Chest pain     Palliative Care Assessment & Plan   Patient Profile: 43 y.o. 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  Palliative consulted for goals of care.  I know Mr.  [name] from prior admission in May of this year.  Recommendations/Plan: - Plan for further palliative conversations as part of workup for LVAD.  - He would like to wait until after he hears if he is a candidate for VAD therapy to discuss further as conversation needs will change depending on his eligibility for LVAD.   -While I am transitioning off service, I will ask another member of the palliative medicine team to follow-up with Mark [name].  Goals of Care and Additional Recommendations:  Limitations on Scope of Treatment: Full Scope Treatment  Code Status:    Code Status Orders  (From admission, onward)         Start     Ordered   06/09/19 1435  Full code  Continuous     06/09/19 1436        Code Status History    Date Active Date Inactive Code Status Order ID Comments User Context   05/09/2019 0439 05/13/2019 1745 Full Code [id number]  Larey Dresser, MD ED   04/09/2019 1528 04/13/2019 1548 Full Code [id number]  Jolaine Artist, MD Inpatient   04/09/2019 1528 04/09/2019 1528 Full Code [id number]  Conrad [place], NP Inpatient   02/14/2019 0208 02/18/2019 1656 Full Code [id number]  Jani Gravel, MD ED   01/25/2019 0142 01/25/2019 1630 Full Code [id number]  Elwyn Reach, MD Inpatient   12/25/2018 0028 12/30/2018 1922 Full Code [id number]  Lenore Cordia, MD ED   11/09/2017 2159 11/14/2017 2051 Full Code [id number]  Rise Patience, MD Inpatient   09/28/2017 0747 10/01/2017 2020 Full Code [id number]  Radene Gunning, NP ED   09/20/2017 2322 09/22/2017 1655 Full Code [id number]  Vianne Bulls, MD ED   02/07/2016 1044 02/08/2016 1802 Full Code [id number]  Norman Herrlich, MD ED   01/03/2016 0520 01/06/2016 1517 Full Code [id number]  Vianne Bulls, MD ED   Advance Care Planning Activity       Prognosis:   Guarded  Discharge Planning:  To Be Determined  Care plan was discussed with patient, RN  Thank you for allowing the Palliative Medicine Team to assist in the care of this patient.   Time In: 1500 Time Out: 1525 Total Time 25 Prolonged Time Billed No      Greater than 50%  of this time was spent counseling and coordinating care  related to the above assessment and plan.  Micheline Rough, MD  Please contact Palliative Medicine Team phone at [phone] for questions and concerns.

## 2019-06-17 NOTE — Progress Notes (Signed)

## 2019-06-17 NOTE — Interval H&P Note (Signed)
History and Physical Interval Note:  06/17/2019 7:44 AM  Mark [name]  has presented today for surgery, with the diagnosis of Heart failure.  The various methods of treatment have been discussed with the patient and family. After consideration of risks, benefits and other options for treatment, the patient has consented to  Procedure(s): RIGHT HEART CATH (N/A) as a surgical intervention.  The patient's history has been reviewed, patient examined, no change in status, stable for surgery.  I have reviewed the patient's chart and labs.  Questions were answered to the patient's satisfaction.     Daniel Bensimhon

## 2019-06-18 ENCOUNTER — Inpatient Hospital Stay (HOSPITAL_COMMUNITY): Payer: Medicaid Other

## 2019-06-18 DIAGNOSIS — I361 Nonrheumatic tricuspid (valve) insufficiency: Secondary | ICD-10-CM

## 2019-06-18 DIAGNOSIS — I340 Nonrheumatic mitral (valve) insufficiency: Secondary | ICD-10-CM

## 2019-06-18 LAB — BASIC METABOLIC PANEL
Anion gap: 8 (ref 5–15)
BUN: 28 mg/dL — ABNORMAL HIGH (ref 6–20)
CO2: 26 mmol/L (ref 22–32)
Calcium: 9.1 mg/dL (ref 8.9–10.3)
Chloride: 100 mmol/L (ref 98–111)
Creatinine, Ser: 1.31 mg/dL — ABNORMAL HIGH (ref 0.61–1.24)
GFR calc Af Amer: >60 mL/min (ref >60)
GFR calc non Af Amer: >60 mL/min (ref >60)
Glucose, Bld: 157 mg/dL — ABNORMAL HIGH (ref 70–99)
Potassium: 4.7 mmol/L (ref 3.5–5.1)
Sodium: 134 mmol/L — ABNORMAL LOW (ref 135–145)

## 2019-06-18 LAB — PROTIME-INR
INR: 3 — ABNORMAL HIGH (ref 0.8–1.2)
Prothrombin Time: 30.6 seconds — ABNORMAL HIGH (ref 11.4–15.2)

## 2019-06-18 LAB — COOXEMETRY PANEL
Carboxyhemoglobin: 1.3 % (ref 0.5–1.5)
Methemoglobin: 1.1 % (ref 0.0–1.5)
O2 Saturation: 52.6 %
Total hemoglobin: 10.2 g/dL — ABNORMAL LOW (ref 12.0–16.0)

## 2019-06-18 LAB — ECHOCARDIOGRAM COMPLETE
Height: 69 in
Weight: 2342.4 oz

## 2019-06-18 LAB — MAGNESIUM: Magnesium: 2 mg/dL (ref 1.7–2.4)

## 2019-06-18 NOTE — Discharge Instructions (Signed)

## 2019-06-18 NOTE — Progress Notes (Signed)
ANTICOAGULATION CONSULT NOTE - [place] for warfarin Indication: apical thrombus  Allergies  Allergen Reactions  . Clindamycin/Lincomycin Swelling and Palpitations  . Hydrocodone Hives  . Lisinopril Swelling and Other (See Comments)    Facial swelling/angioedema  . Olopatadine Hcl Swelling    Makes his face swell up  . Prednisone Shortness Of Breath, Nausea Only, Swelling and Other (See Comments)    Also made chest feel tight and genital area, legs, and face became swollen badly  . Lasix [Furosemide] Hives and Swelling    Facial swelling Reaction to name brand LASIX  . Penicillins Hives and Swelling     Has patient had a PCN reaction causing immediate rash, facial/tongue/throat swelling, SOB or lightheadedness with hypotension: Yes Has patient had a PCN reaction causing severe rash involving mucus membranes or skin necrosis: No Has patient had a PCN reaction that required hospitalization: No Has patient had a PCN reaction occurring within the last 10 years: No If all of the above answers are "NO", then may proceed with Cephalosporin use.   . Bactrim [Sulfamethoxazole-Trimethoprim] Swelling and Rash    Facial swelling    Patient Measurements: Height: 5\' 9"  (175.3 cm) Weight: 146 lb 6.4 oz (66.4 kg)(scale b) IBW/kg (Calculated) : 70.7  Vital Signs: Temp: 98.3 F (36.8 C) (10/27 0758) Temp Source: Oral (10/27 0758) BP: 106/89 (10/27 0758) Pulse Rate: 112 (10/27 0758)  Labs: Recent Labs    06/16/19 0510  06/17/19 0430 06/17/19 0758 06/17/19 0802 06/18/19 0500  HGB  --    < > 10.0* 11.2* 11.9*  --   HCT  --   --  31.9* 33.0* 35.0*  --   PLT  --   --  254  --   --   --   LABPROT 27.0*  --  28.0*  --   --  30.6*  INR 2.5*  --  2.7*  --   --  3.0*  CREATININE 1.09  --  1.34*  --   --  1.31*   < > = values in this interval not displayed.    Estimated Creatinine Clearance: 66.9 mL/min (A) (by C-G formula based on SCr of 1.31 mg/dL  (H)).   Medical History: Past Medical History:  Diagnosis Date  . Asthma   . Chronic systolic CHF (congestive heart failure) ([place])   . Cigarette smoker   . CKD (chronic kidney disease), stage II    Archie Endo 10/01/2017  . COPD (chronic obstructive pulmonary disease) ([place]) 10/21/2017   on CT scan chest  . History of echocardiogram    a. Echo 5/17 - EF 20-25%, severe diff HK, restrictive physiology, mild to mod MR, severe reduced RVSF, mod RVE, mild RAE, mod TR, PASP 48 mmHg  . Hx of cardiac cath    a. LHC 5/17 - normal coronary arteries. PA 45/25, mean 33, PCWP mean 18  . NICM (nonischemic cardiomyopathy) ([place])   . Stroke ([place]) 09/27/2017   "was weak on my left side; I'm fully recovered" (11/09/2017)  . Substance abuse ([place])    cocaine, marijuana     Assessment: 65 yoM with end-stage cardiomyopathy admitted with acute CHF. Pt on warfarin PTA for hx of apical thrombus on ECHO 12/2018. Pt resumed on home warfarin regimen, INR has remained therapeutic at 3 today. S/p  RHC 10/26  - remains on milrinone 0.318mcg/kg/min  *PTA dose = 5mg  daily except 2.5mg  Tues  Goal of Therapy:  INR 2-3 Monitor platelets by anticoagulation protocol: Yes  Plan:  -Continue Warfarin 5mg  daily except 2.5mg  Tues -Daily PT/INR.  Pharm.D. CPP, BCPS Clinical Pharmacist [phone] 06/18/2019 9:04 AM

## 2019-06-18 NOTE — Discharge Summary (Addendum)
Advanced Heart Failure Team  Discharge Summary   Patient ID: Mark [name] MRN: [id number], DOB/AGE: [birth year] 43 y.o. Admit date: 06/09/2019 D/C date:     06/18/2019   Primary Discharge Diagnoses:  1. R Middle Lobe PNA 2. 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 Apical Thrombus 4. H/O MCA CVA 2019  5. AKI    Hospital Course:  Mark [name] a 43 y.o.malewith a history ofchronic systolic CHF (EF 16-94% with severe diffuse hypokinesis5/2020,NICM by cardiac cath 2017, history of right MCA CVA 2019, asthma, andpolysubstance use. Lastecho from 5/20 showed that EF is markedly low at 10% with LV apical thrombus. The RV is severely dysfunctional as well. RHC in 8/20 with low output and high filling pressures, he was on milrinone during 8/20 admission but insisted on going home for a funeral.   He was readmitted 05/08/19 with recurrent cardiogenic shock.Co-ox was in the 30s. He was placed on milrinone and diuresed with IV lasix. Once diuresed he was transitioned to lasix 80 mg daily. Overall diuresed 9 pounds. Milrinone was turned off because he wanted to go home and get his affairs in order. He was referred to Palliative Care and understood he has limited time. He was not ready to transition to Hospice but was agreeable to outpatient Palliative Care. He was referred prior to discharge.   Readmitted 06/09/19 with recurrent A/C biventricular heart failure/cardiogenic shock and possible PNA. PNA treated with antibiotic course. Placed on milrinone and diuresed with IV lasix. During his hospitalization he refused IV lasix 4 times. He wanted to pursue LVAD work up but was ultimately deemed to high risk for VAD at [facility] due to RV failure, poor PFTs, and social barriers. Discussed referral to [facility] for second opinion but he was adamant he wanted to go home today without milrinone. Milrinone was stopped and PICC was removed.  He is at high risk for readmit due to severe  biventricular heart failure.   See below detailed problem list.    1. Rt Middle Lobe HWT:UUEKC negative. Step Pneumo +. Blood cultures negative.Received IV Vanco and cefepimein ED>>changed tocefdinir + doxycycline.  - Completed abx course.   2. Acute on Chronic Biventricular Heart Failure w/ Low Output:History of nonischemic cardiomyopathy. Lastecho 5/20 showed that EF is markedly low at 5-10% with LV apical thrombus. The RV is severely dysfunctional as well. RHC in 8/20 with low output and high filling pressures. He required inotropic support previous 2 admits (initial co-ox in the 30s>>improved to 63% w/ milrinone) but refuses home milrinone. Not a candidate for LVAD w/ severe RV failure and substance abuse.  -We discussed home inotropes but hecontinues to refuse. We have consulted palliative care to seeto discuss hospice.He has spoke to the Hospice team and has told them he is now interested in LVAD.   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 He has also previously refused VAD saying he would continue to do anything he wants to do and not be able to comply with what was needed to take care of a VAD. - VAD work up completed and he was deemed to high risk for VAD at [facility] due to RV failure, poor PFTs, and possible social barriers. Discussed referral to [facility] for second opinion but he was adamant he wanted to go home today.  - He does not want to continue milrinone. Stop milrinone. No bb with low output.  - Diuresed with IV lasix then transitioned to lasix 80 mg/40mg .  - Continue  digoxin and spiro   3. Apical Thrombus:Identified on ECHO 12/2018. -on coumadin. INR stable at  3.0  - no bleeding. Discussed dosing with PharmD personally. - Follow up at [facility].   4. H/O MCA CVA 2019:  -Continue atorvastatin 40 mg daily. - Continue Coumadin +aspirin 81 mg daily. - INR therapeutic at 3   5. AKI: Scr 1.30 on admit. (baseline ~1.0) - suspect 2/2 low output HF  and hypotension  -Creatinine up 1.3   6. CODE STATUS: Full code for now. See discussion above  Discharge Vitals: Blood pressure 106/89, pulse (!) 112, temperature 98.3 F (36.8 C), temperature source Oral, resp. rate 18, height 5\' 9"  (1.753 m), weight 66.4 kg, SpO2 100 %.  Labs: Lab Results  Component Value Date   WBC 5.6 06/17/2019   HGB 11.9 (L) 06/17/2019   HCT 35.0 (L) 06/17/2019   MCV 81.2 06/17/2019   PLT 254 06/17/2019    Recent Labs  Lab 06/18/19 0500  NA 134*  K 4.7  CL 100  CO2 26  BUN 28*  CREATININE 1.31*  CALCIUM 9.1  GLUCOSE 157*   Lab Results  Component Value Date   CHOL 118 04/12/2019   HDL 40 (L) 04/12/2019   LDLCALC 69 04/12/2019   TRIG 43 04/12/2019   BNP (last 3 results) Recent Labs    02/13/19 2243 05/08/19 2018 06/09/19 1140  BNP 3,902.9* 3,017.6* 2,175.3*    ProBNP (last 3 results) No results for input(s): PROBNP in the last 8760 hours.   Diagnostic Studies/Procedures   No results found.  Discharge Medications   Allergies as of 06/18/2019      Reactions   Clindamycin/lincomycin Swelling, Palpitations   Hydrocodone Hives   Lisinopril Swelling, Other (See Comments)   Facial swelling/angioedema   Olopatadine Hcl Swelling   Makes his face swell up   Prednisone Shortness Of Breath, Nausea Only, Swelling, Other (See Comments)   Also made chest feel tight and genital area, legs, and face became swollen badly   Lasix [furosemide] Hives, Swelling   Facial swelling Reaction to name brand LASIX   Penicillins Hives, Swelling   Has patient had a PCN reaction causing immediate rash, facial/tongue/throat swelling, SOB or lightheadedness with hypotension: Yes Has patient had a PCN reaction causing severe rash involving mucus membranes or skin necrosis: No Has patient had a PCN reaction that required hospitalization: No Has patient had a PCN reaction occurring within the last 10 years: No If all of the above answers are "NO", then may  proceed with Cephalosporin use.   Bactrim [sulfamethoxazole-trimethoprim] Swelling, Rash   Facial swelling      Medication List    STOP taking these medications   losartan 25 MG tablet Commonly known as: COZAAR     TAKE these medications   albuterol 108 (90 Base) MCG/ACT inhaler Commonly known as: VENTOLIN HFA Inhale 1-2 puffs into the lungs every 4 (four) hours as needed for wheezing or shortness of breath.   aspirin 81 MG EC tablet Take 1 tablet (81 mg total) by mouth daily. What changed: how much to take   budesonide-formoterol 80-4.5 MCG/ACT inhaler Commonly known as: SYMBICORT Inhale 2 puffs into the lungs 2 (two) times daily.   digoxin 0.125 MG tablet Commonly known as: LANOXIN Take 1 tablet (0.125 mg total) by mouth daily.   furosemide 80 MG tablet Commonly known as: LASIX Take 1 tablet (80 mg total) by mouth every morning AND 0.5 tablets (40 mg total) every evening. What changed:  See the new instructions.   multivitamin with minerals Tabs tablet Take 1 tablet by mouth daily.   Muscle Rub 10-15 % Crea Apply 1 application topically daily as needed for muscle pain.   OVER THE COUNTER MEDICATION Place 1 spray into both nostrils daily as needed (congestion). OTC  Nasal spray   potassium chloride SA 20 MEQ tablet Commonly known as: KLOR-CON Take 1 tablet (20 mEq total) by mouth daily. Take 80meq with Metolazone. What changed: additional instructions   spironolactone 25 MG tablet Commonly known as: ALDACTONE Take 1 tablet (25 mg total) by mouth daily.   warfarin 5 MG tablet Commonly known as: COUMADIN Take as directed. If you are unsure how to take this medication, talk to your nurse or doctor. Original instructions: 5 mg daily except 2.5 mg on Tuesday What changed:   how much to take  how to take this  when to take this  additional instructions       Disposition   The patient will be discharged in stable condition to home. Discharge  Instructions    (HEART FAILURE PATIENTS) Call MD:  Anytime you have any of the following symptoms: 1) 3 pound weight gain in 24 hours or 5 pounds in 1 week 2) shortness of breath, with or without a dry hacking cough 3) swelling in the hands, feet or stomach 4) if you have to sleep on extra pillows at night in order to breathe.   Complete by: As directed    Diet - low sodium heart healthy   Complete by: As directed    Heart Failure patients record your daily weight using the same scale at the same time of day   Complete by: As directed    Increase activity slowly   Complete by: As directed      Follow-up Information    [facility] Follow up on 06/25/2019.   Specialty: Cardiology Why: at 1000 Contact information: [address] [phone] [place] [phone]            Duration of Discharge Encounter: Greater than 35 minutes   Signed, Darrick Grinder NP-C  06/18/2019, 10:56 AM  Patient seen and examined with Darrick Grinder, NP. We discussed all aspects of the encounter. I agree with the assessment and plan as stated above.   Results of patients medical/social history, RHC, echo and PFTs reviewed at LVAD MRB meeting last night and patient felt to be very high-risk for VAD implantation with severe RV failure despite milrinone, severe lung disease and poor compliance. I informed patient of this decision today on rounds and he understandably became very upset and angry. I offered him transfer to [facility] for a second evaluation but he refused saying he would go there on his own. We also discussed possibility of home inotropes but he again refused. Wants to leave now and have PICC removed. Will plan d/c later today. I d/w [facility] May[facility] Ro[facility] of [facility].   Glori Bickers, MD  11:59 AM

## 2019-06-18 NOTE — Progress Notes (Signed)
  PROGRESS NOTE  Spoke with Dr. Haroldine Laws. Patient poor candidate for LVAD and will be discharged home today. Advanced Heart Failure team has offered to complete discharge paperwork.    Dessa Phi, DO [facility] 06/18/2019, 10:51 AM  Available via Epic secure chat 7am-7pm After these hours, please refer to coverage provider listed on [email or web address]

## 2019-06-18 NOTE — Progress Notes (Signed)
Echocardiogram 2D Echocardiogram has been performed.  Mark [name] 06/18/2019, 9:20 AM

## 2019-06-19 ENCOUNTER — Other Ambulatory Visit: Payer: Self-pay

## 2019-06-19 ENCOUNTER — Inpatient Hospital Stay (HOSPITAL_COMMUNITY)
Admission: EM | Admit: 2019-06-19 | Discharge: 2019-06-25 | DRG: 291 | Disposition: A | Discharge status: Hospice | Payer: Medicaid Other | Attending: Internal Medicine | Admitting: Internal Medicine

## 2019-06-19 ENCOUNTER — Inpatient Hospital Stay: Payer: Self-pay

## 2019-06-19 ENCOUNTER — Emergency Department (HOSPITAL_COMMUNITY): Payer: Medicaid Other

## 2019-06-19 DIAGNOSIS — Z20828 Contact with and (suspected) exposure to other viral communicable diseases: Secondary | ICD-10-CM | POA: Diagnosis present

## 2019-06-19 DIAGNOSIS — Z9119 Patient's noncompliance with other medical treatment and regimen: Secondary | ICD-10-CM

## 2019-06-19 DIAGNOSIS — Z8249 Family history of ischemic heart disease and other diseases of the circulatory system: Secondary | ICD-10-CM | POA: Diagnosis not present

## 2019-06-19 DIAGNOSIS — Z5329 Procedure and treatment not carried out because of patient's decision for other reasons: Secondary | ICD-10-CM | POA: Diagnosis not present

## 2019-06-19 DIAGNOSIS — Z87891 Personal history of nicotine dependence: Secondary | ICD-10-CM | POA: Diagnosis not present

## 2019-06-19 DIAGNOSIS — Z7982 Long term (current) use of aspirin: Secondary | ICD-10-CM | POA: Diagnosis not present

## 2019-06-19 DIAGNOSIS — I5082 Biventricular heart failure: Secondary | ICD-10-CM | POA: Diagnosis present

## 2019-06-19 DIAGNOSIS — I454 Nonspecific intraventricular block: Secondary | ICD-10-CM | POA: Diagnosis present

## 2019-06-19 DIAGNOSIS — J4520 Mild intermittent asthma, uncomplicated: Secondary | ICD-10-CM

## 2019-06-19 DIAGNOSIS — Z7189 Other specified counseling: Secondary | ICD-10-CM

## 2019-06-19 DIAGNOSIS — D649 Anemia, unspecified: Secondary | ICD-10-CM

## 2019-06-19 DIAGNOSIS — N183 Chronic kidney disease, stage 3 unspecified: Secondary | ICD-10-CM | POA: Diagnosis present

## 2019-06-19 DIAGNOSIS — E871 Hypo-osmolality and hyponatremia: Secondary | ICD-10-CM | POA: Diagnosis present

## 2019-06-19 DIAGNOSIS — I5043 Acute on chronic combined systolic (congestive) and diastolic (congestive) heart failure: Secondary | ICD-10-CM | POA: Diagnosis present

## 2019-06-19 DIAGNOSIS — R570 Cardiogenic shock: Secondary | ICD-10-CM | POA: Diagnosis present

## 2019-06-19 DIAGNOSIS — R4922 Hyponasality: Secondary | ICD-10-CM

## 2019-06-19 DIAGNOSIS — N179 Acute kidney failure, unspecified: Secondary | ICD-10-CM | POA: Diagnosis present

## 2019-06-19 DIAGNOSIS — E162 Hypoglycemia, unspecified: Principal | ICD-10-CM

## 2019-06-19 DIAGNOSIS — Z515 Encounter for palliative care: Secondary | ICD-10-CM

## 2019-06-19 DIAGNOSIS — J449 Chronic obstructive pulmonary disease, unspecified: Secondary | ICD-10-CM | POA: Diagnosis present

## 2019-06-19 DIAGNOSIS — E875 Hyperkalemia: Secondary | ICD-10-CM | POA: Diagnosis present

## 2019-06-19 DIAGNOSIS — I472 Ventricular tachycardia: Secondary | ICD-10-CM | POA: Diagnosis not present

## 2019-06-19 DIAGNOSIS — I5042 Chronic combined systolic (congestive) and diastolic (congestive) heart failure: Secondary | ICD-10-CM

## 2019-06-19 DIAGNOSIS — D509 Iron deficiency anemia, unspecified: Secondary | ICD-10-CM | POA: Diagnosis present

## 2019-06-19 DIAGNOSIS — J189 Pneumonia, unspecified organism: Secondary | ICD-10-CM

## 2019-06-19 DIAGNOSIS — R0602 Shortness of breath: Secondary | ICD-10-CM | POA: Diagnosis present

## 2019-06-19 DIAGNOSIS — Z880 Allergy status to penicillin: Secondary | ICD-10-CM

## 2019-06-19 DIAGNOSIS — Z8673 Personal history of transient ischemic attack (TIA), and cerebral infarction without residual deficits: Secondary | ICD-10-CM | POA: Diagnosis not present

## 2019-06-19 DIAGNOSIS — J9600 Acute respiratory failure, unspecified whether with hypoxia or hypercapnia: Secondary | ICD-10-CM | POA: Diagnosis present

## 2019-06-19 DIAGNOSIS — R0603 Acute respiratory distress: Secondary | ICD-10-CM

## 2019-06-19 DIAGNOSIS — Z7951 Long term (current) use of inhaled steroids: Secondary | ICD-10-CM

## 2019-06-19 DIAGNOSIS — I428 Other cardiomyopathies: Secondary | ICD-10-CM | POA: Diagnosis present

## 2019-06-19 DIAGNOSIS — I513 Intracardiac thrombosis, not elsewhere classified: Secondary | ICD-10-CM | POA: Diagnosis present

## 2019-06-19 DIAGNOSIS — Z881 Allergy status to other antibiotic agents status: Secondary | ICD-10-CM

## 2019-06-19 DIAGNOSIS — Z7901 Long term (current) use of anticoagulants: Secondary | ICD-10-CM | POA: Diagnosis not present

## 2019-06-19 DIAGNOSIS — Z882 Allergy status to sulfonamides status: Secondary | ICD-10-CM

## 2019-06-19 DIAGNOSIS — Z888 Allergy status to other drugs, medicaments and biological substances status: Secondary | ICD-10-CM

## 2019-06-19 DIAGNOSIS — I5084 End stage heart failure: Secondary | ICD-10-CM | POA: Diagnosis present

## 2019-06-19 DIAGNOSIS — Z79899 Other long term (current) drug therapy: Secondary | ICD-10-CM

## 2019-06-19 DIAGNOSIS — Z885 Allergy status to narcotic agent status: Secondary | ICD-10-CM

## 2019-06-19 LAB — CBC
HCT: 33.6 % — ABNORMAL LOW (ref 39.0–52.0)
Hemoglobin: 10.7 g/dL — ABNORMAL LOW (ref 13.0–17.0)
MCH: 25.7 pg — ABNORMAL LOW (ref 26.0–34.0)
MCHC: 31.8 g/dL (ref 30.0–36.0)
MCV: 80.6 fL (ref 80.0–100.0)
Platelets: 248 10*3/uL (ref 150–400)
RBC: 4.17 MIL/uL — ABNORMAL LOW (ref 4.22–5.81)
RDW: 16.8 % — ABNORMAL HIGH (ref 11.5–15.5)
WBC: 11.2 10*3/uL — ABNORMAL HIGH (ref 4.0–10.5)
nRBC: 0 % (ref 0.0–0.2)

## 2019-06-19 LAB — CBG MONITORING, ED
Glucose-Capillary: 40 mg/dL — CL (ref 70–99)
Glucose-Capillary: 84 mg/dL (ref 70–99)
Glucose-Capillary: 107 mg/dL — ABNORMAL HIGH (ref 70–99)
Glucose-Capillary: 102 mg/dL — ABNORMAL HIGH (ref 70–99)

## 2019-06-19 LAB — BASIC METABOLIC PANEL
Anion gap: 19 — ABNORMAL HIGH (ref 5–15)
Anion gap: 12 (ref 5–15)
BUN: 33 mg/dL — ABNORMAL HIGH (ref 6–20)
BUN: 31 mg/dL — ABNORMAL HIGH (ref 6–20)
CO2: 18 mmol/L — ABNORMAL LOW (ref 22–32)
CO2: 25 mmol/L (ref 22–32)
Calcium: 10.2 mg/dL (ref 8.9–10.3)
Calcium: 10 mg/dL (ref 8.9–10.3)
Chloride: 94 mmol/L — ABNORMAL LOW (ref 98–111)
Chloride: 99 mmol/L (ref 98–111)
Creatinine, Ser: 1.85 mg/dL — ABNORMAL HIGH (ref 0.61–1.24)
Creatinine, Ser: 1.39 mg/dL — ABNORMAL HIGH (ref 0.61–1.24)
GFR calc Af Amer: 50 mL/min — ABNORMAL LOW (ref >60)
GFR calc Af Amer: >60 mL/min (ref >60)
GFR calc non Af Amer: 43 mL/min — ABNORMAL LOW (ref >60)
GFR calc non Af Amer: >60 mL/min (ref >60)
Glucose, Bld: 155 mg/dL — ABNORMAL HIGH (ref 70–99)
Glucose, Bld: 56 mg/dL — ABNORMAL LOW (ref 70–99)
Potassium: 7 mmol/L (ref 3.5–5.1)
Potassium: 4.8 mmol/L (ref 3.5–5.1)
Sodium: 131 mmol/L — ABNORMAL LOW (ref 135–145)
Sodium: 136 mmol/L (ref 135–145)

## 2019-06-19 LAB — LACTIC ACID, PLASMA
Lactic Acid, Venous: 8.1 mmol/L (ref 0.5–1.9)
Lactic Acid, Venous: 2.7 mmol/L (ref 0.5–1.9)

## 2019-06-19 LAB — TROPONIN I (HIGH SENSITIVITY)
Troponin I (High Sensitivity): 35 ng/L — ABNORMAL HIGH (ref <18)
Troponin I (High Sensitivity): 31 ng/L — ABNORMAL HIGH (ref <18)

## 2019-06-19 LAB — CBC WITH DIFFERENTIAL/PLATELET
Abs Immature Granulocytes: 0.04 10*3/uL (ref 0.00–0.07)
Basophils Absolute: 0 10*3/uL (ref 0.0–0.1)
Basophils Relative: 0 %
Eosinophils Absolute: 0 10*3/uL (ref 0.0–0.5)
Eosinophils Relative: 0 %
HCT: 39 % (ref 39.0–52.0)
Hemoglobin: 11.5 g/dL — ABNORMAL LOW (ref 13.0–17.0)
Immature Granulocytes: 1 %
Lymphocytes Relative: 16 %
Lymphs Abs: 1.3 10*3/uL (ref 0.7–4.0)
MCH: 25.2 pg — ABNORMAL LOW (ref 26.0–34.0)
MCHC: 29.5 g/dL — ABNORMAL LOW (ref 30.0–36.0)
MCV: 85.3 fL (ref 80.0–100.0)
Monocytes Absolute: 0.5 10*3/uL (ref 0.1–1.0)
Monocytes Relative: 6 %
Neutro Abs: 6.6 10*3/uL (ref 1.7–7.7)
Neutrophils Relative %: 77 %
Platelets: 295 10*3/uL (ref 150–400)
RBC: 4.57 MIL/uL (ref 4.22–5.81)
RDW: 17.7 % — ABNORMAL HIGH (ref 11.5–15.5)
WBC: 8.4 10*3/uL (ref 4.0–10.5)
nRBC: 0 % (ref 0.0–0.2)

## 2019-06-19 LAB — HEPATIC FUNCTION PANEL
ALT: 29 U/L (ref 0–44)
AST: 52 U/L — ABNORMAL HIGH (ref 15–41)
Albumin: 3.6 g/dL (ref 3.5–5.0)
Alkaline Phosphatase: 115 U/L (ref 38–126)
Bilirubin, Direct: 1 mg/dL — ABNORMAL HIGH (ref 0.0–0.2)
Indirect Bilirubin: 1.4 mg/dL — ABNORMAL HIGH (ref 0.3–0.9)
Total Bilirubin: 2.4 mg/dL — ABNORMAL HIGH (ref 0.3–1.2)
Total Protein: 8.7 g/dL — ABNORMAL HIGH (ref 6.5–8.1)

## 2019-06-19 LAB — RAPID URINE DRUG SCREEN, HOSP PERFORMED
Amphetamines: NOT DETECTED
Barbiturates: NOT DETECTED
Benzodiazepines: NOT DETECTED
Cocaine: NOT DETECTED
Opiates: NOT DETECTED
Tetrahydrocannabinol: NOT DETECTED

## 2019-06-19 LAB — PROTIME-INR
INR: 3.5 — ABNORMAL HIGH (ref 0.8–1.2)
Prothrombin Time: 34.5 seconds — ABNORMAL HIGH (ref 11.4–15.2)

## 2019-06-19 LAB — COOXEMETRY PANEL
Carboxyhemoglobin: 1.1 % (ref 0.5–1.5)
Methemoglobin: 1.2 % (ref 0.0–1.5)
O2 Saturation: 59.3 %
Total hemoglobin: 11.5 g/dL — ABNORMAL LOW (ref 12.0–16.0)

## 2019-06-19 LAB — BRAIN NATRIURETIC PEPTIDE: B Natriuretic Peptide: 2045.3 pg/mL — ABNORMAL HIGH (ref 0.0–100.0)

## 2019-06-19 LAB — DIGOXIN LEVEL: Digoxin Level: 1 ng/mL (ref 0.8–2.0)

## 2019-06-19 LAB — MAGNESIUM: Magnesium: 2.3 mg/dL (ref 1.7–2.4)

## 2019-06-19 LAB — SARS CORONAVIRUS 2 (TAT 6-24 HRS): SARS Coronavirus 2: NEGATIVE

## 2019-06-19 MED ORDER — DEXTROSE 50 % IV SOLN
INTRAVENOUS | Status: AC
  Administered 2019-06-19: 50 mL via INTRAVENOUS
  Filled 2019-06-19: qty 50

## 2019-06-19 MED ORDER — SODIUM CHLORIDE 0.9% FLUSH: 3.0000 mL | INTRAVENOUS | Status: DC | PRN

## 2019-06-19 MED ORDER — CHLORHEXIDINE GLUCONATE CLOTH 2 % EX PADS
6.0000 | MEDICATED_PAD | Freq: Every day | CUTANEOUS | Status: DC
  Administered 2019-06-19: 6 via TOPICAL

## 2019-06-19 MED ORDER — MILRINONE LACTATE IN DEXTROSE 20-5 MG/100ML-% IV SOLN
0.5000 ug/kg/min | INTRAVENOUS | Status: DC
  Administered 2019-06-19 – 2019-06-21 (×7): 0.375 ug/kg/min via INTRAVENOUS
  Administered 2019-06-22 – 2019-06-25 (×6): 0.5 ug/kg/min via INTRAVENOUS
  Filled 2019-06-19 (×14): qty 100

## 2019-06-19 MED ORDER — CALCIUM GLUCONATE 10 % IV SOLN
1.0000 g | Freq: Once | INTRAVENOUS | Status: AC
  Administered 2019-06-19: 1 g via INTRAVENOUS
  Filled 2019-06-19: qty 10

## 2019-06-19 MED ORDER — SODIUM CHLORIDE 0.9% FLUSH
10.0000 mL | Freq: Two times a day (BID) | INTRAVENOUS | Status: DC
  Administered 2019-06-20 – 2019-06-24 (×4): 10 mL

## 2019-06-19 MED ORDER — FUROSEMIDE 10 MG/ML IJ SOLN
12.0000 mg/h | INTRAVENOUS | Status: DC
  Administered 2019-06-19 – 2019-06-20 (×2): 12 mg/h via INTRAVENOUS
  Filled 2019-06-19 (×2): qty 25

## 2019-06-19 MED ORDER — SODIUM CHLORIDE 0.9 % IV SOLN: 250.0000 mL | INTRAVENOUS | Status: DC | PRN

## 2019-06-19 MED ORDER — DEXTROSE 50 % IV SOLN
1.0000 | Freq: Once | INTRAVENOUS | Status: AC
  Administered 2019-06-19: 05:00:00 50 mL via INTRAVENOUS

## 2019-06-19 MED ORDER — SODIUM BICARBONATE 8.4 % IV SOLN
50.0000 meq | Freq: Once | INTRAVENOUS | Status: AC
  Administered 2019-06-19: 50 meq via INTRAVENOUS

## 2019-06-19 MED ORDER — SODIUM CHLORIDE 0.9% FLUSH
3.0000 mL | Freq: Two times a day (BID) | INTRAVENOUS | Status: DC
  Administered 2019-06-20: 3 mL via INTRAVENOUS

## 2019-06-19 MED ORDER — DEXTROSE 50 % IV SOLN
50.0000 mL | Freq: Once | INTRAVENOUS | Status: AC
  Administered 2019-06-19: 50 mL via INTRAVENOUS
  Filled 2019-06-19: qty 50

## 2019-06-19 MED ORDER — ONDANSETRON HCL 4 MG/2ML IJ SOLN: 4.0000 mg | Freq: Four times a day (QID) | INTRAMUSCULAR | Status: DC | PRN

## 2019-06-19 MED ORDER — LEVALBUTEROL HCL 0.63 MG/3ML IN NEBU
0.6300 mg | INHALATION_SOLUTION | Freq: Three times a day (TID) | RESPIRATORY_TRACT | Status: DC
  Administered 2019-06-19 – 2019-06-20 (×2): 0.63 mg via RESPIRATORY_TRACT
  Filled 2019-06-19 (×2): qty 3

## 2019-06-19 MED ORDER — INSULIN ASPART 100 UNIT/ML IV SOLN
10.0000 [IU] | Freq: Once | INTRAVENOUS | Status: AC
  Administered 2019-06-19: 10 [IU] via INTRAVENOUS

## 2019-06-19 MED ORDER — SODIUM CHLORIDE 0.9% FLUSH: 10.0000 mL | INTRAVENOUS | Status: DC | PRN

## 2019-06-19 MED ORDER — CHLORHEXIDINE GLUCONATE 0.12 % MT SOLN
15.0000 mL | Freq: Two times a day (BID) | OROMUCOSAL | Status: DC
  Administered 2019-06-19 – 2019-06-23 (×7): 15 mL via OROMUCOSAL
  Filled 2019-06-19 (×7): qty 15

## 2019-06-19 MED ORDER — SODIUM ZIRCONIUM CYCLOSILICATE 10 G PO PACK: 10.0000 g | PACK | Freq: Once | ORAL | Status: DC

## 2019-06-19 MED ORDER — ACETAMINOPHEN 325 MG PO TABS
650.0000 mg | ORAL_TABLET | ORAL | Status: DC | PRN
  Administered 2019-06-23 – 2019-06-24 (×2): 650 mg via ORAL
  Filled 2019-06-19 (×2): qty 2

## 2019-06-19 MED ORDER — FUROSEMIDE 10 MG/ML IJ SOLN
80.0000 mg | Freq: Once | INTRAMUSCULAR | Status: AC
  Administered 2019-06-19: 80 mg via INTRAVENOUS
  Filled 2019-06-19: qty 8

## 2019-06-19 MED ORDER — ENOXAPARIN SODIUM 40 MG/0.4ML ~~LOC~~ SOLN: 40.0000 mg | SUBCUTANEOUS | Status: DC

## 2019-06-19 MED ORDER — ORAL CARE MOUTH RINSE
15.0000 mL | Freq: Two times a day (BID) | OROMUCOSAL | Status: DC
  Administered 2019-06-19 – 2019-06-23 (×6): 15 mL via OROMUCOSAL

## 2019-06-19 NOTE — ED Triage Notes (Addendum)
Pt presents to ED from home BIB [facility]. Pt c/o gradual SOB sitting on couch. Pt d/c earlier from [facility] for stent that was not placed. Per EMS pt says his "heart was bad." Pt currently very labored breathing and diaphoretic.  130/80 down to 80 palp 50% on RA 100% on NRB

## 2019-06-19 NOTE — Telephone Encounter (Addendum)
Pt was discharged from the Hospital yesterday and is currently back in the Hospital today. Will continue to check chart to see once pt gets discharged form the hospital so he can get scheduled in the Anticoagulation Clinic.   Anticoagulation Staff: Leave this note so that once he is discharged we can ensure he follows up in the office for Anticoagulation management/Warfarin dosing. Thanks.

## 2019-06-19 NOTE — Progress Notes (Addendum)
Went to patient room because the BIPAP was alarming. Patient has the BIPAP mask on but has disconnected the tubing that connects to BIPAP mask. Patient currently talking on the phone. Attempted to see if patient needed to be connected back and patient became upset and angry stating "He's done this for 3 years, he know what he's doing" RT asked if patient would like to take the mask off instead so he could talk on the phone. Patient got angry and states that he has already told the RN he knows what he is doing and he can connect himself back and does not want [place] to touch him. Patient continues complaining while talking on the phone. Vitals are stable. RN aware.

## 2019-06-19 NOTE — Progress Notes (Signed)
RT note-Patient transported to ICU on Bipap on current settings.

## 2019-06-19 NOTE — Progress Notes (Signed)
RT note-Patient placed on nasal cannula 4l/min per patients request.

## 2019-06-19 NOTE — Progress Notes (Signed)
Patient asked to come off bipap to make a phone call. He is refusing to go back on at this time.

## 2019-06-19 NOTE — H&P (Addendum)
Advanced Heart Failure Team History and Physical Note   PCP:  Grayce SessionsEdwards, Michelle P, NP  PCP-Cardiology: Verne Carrowhristopher McAlhany, MD     Reason for Admission: Cardiogenic Shock   HPI:   severe diffuse hypokinesis5/2020,NICM by cardiac cath 2017, history of right MCA CVA 2019, asthma, andpolysubstance use. Lastecho from 5/20 showed that EF is markedly low at 10% with LV apical thrombus. The RV is severely dysfunctional as well. RHC in 8/20 with low output and high filling pressures, he was on milrinone during 8/20 admission but insisted on going home forafuneral.   He was readmitted 9/16/20with recurrent cardiogenic shock.Co-ox was in the 30s. He was placed on milrinone and diuresed with IV lasix. Once diuresed he was transitioned to lasix 80 mg daily. Overall diuresed 9 pounds. Milrinone was turned off because he wanted to go home and get his affairs in order. He was referred to Palliative Care and understood he has limited time. He was not ready to transition to Hospice but was agreeable to outpatient Palliative Care. He was referred prior to discharge.  Readmitted 06/09/19 with recurrent A/C biventricular heart failure/cardiogenic shock and possible PNA. PNA treated with antibiotic course. Placed on milrinone and diuresed with IV lasix. During his hospitalization he refused IV lasix 4 times. He wanted to pursue LVAD work up but was ultimately deemed to high risk [facility] due to RV failure, poor PFTs, and social barriers. Discussed [facility] for [facility] opinion but he was adamant he wanted to go home today without milrinone. Milrinone was stopped and PICC was removed.  Discharged 06/18/19.   [facility] with increased shortness of breath. He was discharged yesterday and says he started feeling bad with increased shorthess of breath. Due tto increased WOB he was placed Bipap. Potassium 7, creatinine 1.8, BNP 2045,  and lactic acid 8. Hyperkalemia treated  in the ED. He was able to tell me he wants everything done and [facility] if he they would consider VAD.   Review of Systems: [y] = yes, [ ]  = no On Bipap obtained most of the information from chart   General: Weight gain [ ] ; Weight loss [ ] ; Anorexia [ ] ; Fatigue [Y ]; Fever [ ] ; Chills [ ] ; Weakness [Y ]  Cardiac: Chest pain/pressure [ ] ; Resting SOB [ Y]; Exertional SOB [ Y]; Orthopnea [ ] ; Pedal Edema [ Y]; Palpitations [ ] ; Syncope [ ] ; Presyncope [ ] ; Paroxysmal nocturnal dyspnea[ ]   Pulmonary: Cough [ ] ; Wheezing[ ] ; Hemoptysis[ ] ; Sputum [ ] ; Snoring [ ]   GI: Vomiting[ ] ; Dysphagia[ ] ; Melena[ ] ; Hematochezia [ ] ; Heartburn[ ] ; Abdominal pain [ ] ; Constipation [ ] ; Diarrhea [ ] ; BRBPR [ ]   GU: Hematuria[ ] ; Dysuria [ ] ; Nocturia[ ]   Vascular: Pain in legs with walking [ ] ; Pain in feet with lying flat [ ] ; Non-healing sores [ ] ; Stroke [ Y]; TIA [ ] ; Slurred speech [ ] ;  Neuro: Headaches[ ] ; Vertigo[ ] ; Seizures[ ] ; Paresthesias[ ] ;Blurred vision [ ] ; Diplopia [ ] ; Vision changes [ ]   Ortho/Skin: Arthritis [ ] ; Joint pain [Y ]; Muscle pain [ ] ; Joint swelling [ ] ; Back Pain [Y ]; Rash [ ]   Psych: Depression[ ] ; Anxiety[ ]   Heme: Bleeding problems [ ] ; Clotting disorders [ ] ; Anemia [ ]   Endocrine: Diabetes [ ] ; Thyroid dysfunction[ ]    Home Medications Prior to Admission medications   Medication Sig Start Date End  Date Taking? Authorizing Provider  albuterol (VENTOLIN HFA) 108 (90 Base) MCG/ACT inhaler Inhale 1-2 puffs into the lungs every 4 (four) hours as needed for wheezing or shortness of breath. 04/26/19   Charlott Rakes, MD  aspirin 81 MG EC tablet Take 1 tablet (81 mg total) by mouth daily. Patient taking differently: Take 162 mg by mouth daily.  12/30/18   Sheikh, Omair Latif, DO  budesonide-formoterol (SYMBICORT) 80-4.5 MCG/ACT inhaler Inhale 2 puffs into the lungs 2 (two) times daily. 04/13/19   Theora Gianotti, NP  digoxin (LANOXIN) 0.125 MG tablet  Take 1 tablet (0.125 mg total) by mouth daily. 12/30/18   Raiford Noble Latif, DO  furosemide (LASIX) 80 MG tablet Take 1 tablet (80 mg total) by mouth every morning AND 0.5 tablets (40 mg total) every evening. Patient taking differently: Take 1 tablet (80 mg total) by mouth every morning AND 0.5 tablet (40 mg) in the evening as needed for swelling 05/20/19   Clegg, Amy D, NP  Menthol-Methyl Salicylate (MUSCLE RUB) 10-15 % CREA Apply 1 application topically daily as needed for muscle pain.    [provider]  Multiple Vitamin (MULTIVITAMIN WITH MINERALS) TABS tablet Take 1 tablet by mouth daily.    [provider]  OVER THE COUNTER MEDICATION Place 1 spray into both nostrils daily as needed (congestion). OTC  Nasal spray    [provider]  potassium chloride SA (K-DUR) 20 MEQ tablet Take 1 tablet (20 mEq total) by mouth daily. Take 48meq with Metolazone. Patient taking differently: Take 20 mEq by mouth daily.  03/08/19   Bensimhon, Shaune Pascal, MD  spironolactone (ALDACTONE) 25 MG tablet Take 1 tablet (25 mg total) by mouth daily. 05/14/19   Darrick Grinder D, NP  warfarin (COUMADIN) 5 MG tablet 5 mg daily except 2.5 mg on Tuesday Patient taking differently: Take 2.5-5 mg by mouth See admin instructions. Take 1/2 tablet (2.5 mg) by mouth on Tuesday evening at 6pm, take 1 tablet (5 mg) on all other evenings of the week - at 6pm 05/13/19   Darrick Grinder D, NP    Past Medical History: Past Medical History:  Diagnosis Date   Asthma    Chronic systolic CHF (congestive heart failure) (HCC)    Cigarette smoker    CKD (chronic kidney disease), stage II    /notes 10/01/2017   COPD (chronic obstructive pulmonary disease) ([place]) 10/21/2017   on CT scan chest   History of echocardiogram    a. Echo 5/17 - EF 20-25%, severe diff HK, restrictive physiology, mild to mod MR, severe reduced RVSF, mod RVE, mild RAE, mod TR, PASP 48 mmHg   Hx of cardiac cath    a. LHC 5/17 - normal coronary  arteries. PA 45/25, mean 33, PCWP mean 18   NICM (nonischemic cardiomyopathy) ([place])    Stroke ([place]) 09/27/2017   "was weak on my left side; I'm fully recovered" (11/09/2017)   Substance abuse ([place])    cocaine, marijuana    Past Surgical History: Past Surgical History:  Procedure Laterality Date   CARDIAC CATHETERIZATION N/A 01/05/2016   Procedure: Right/Left Heart Cath and Coronary Angiography;  Surgeon: Troy Sine, MD;  Location: [place] [facility];  Service: Cardiovascular;  Laterality: N/A;   MULTIPLE EXTRACTIONS WITH ALVEOLOPLASTY Bilateral 04/11/2019   Procedure: Extraction of tooth #'s 2, 4, 5, 10, and 12 with alveoloplasty;  Surgeon: Lenn Cal, DDS;  Location: [place];  Service: Oral Surgery;  Laterality: Bilateral;   RIGHT  HEART CATH N/A 04/09/2019   Procedure: RIGHT HEART CATH;  Surgeon: Dolores Patty, MD;  [facility]: [facility] INVASIVE [facility];  Service: Cardiovascular;  Laterality: N/A;   RIGHT HEART CATH N/A 06/17/2019   Procedure: RIGHT HEART CATH;  Surgeon: Dolores Patty, MD;  Location: [facility] INVASIVE CV LAB;  Service: Cardiovascular;  Laterality: N/A;    Family History:  Family History  Problem Relation Age of Onset   Cardiomyopathy Father        Reports his father has an LVAD   Heart failure Father    Hypertension Father    Heart disease Maternal Grandmother        had a whole in her heart   Deep vein thrombosis Neg Hx     Social History: Social History   Socioeconomic History   Marital status: Divorced    Spouse name: Not on file   Number of children: Not on file   Years of education: Not on file   Highest education level: Not on file  Occupational History   Not on file  Social Needs   Financial resource strain: Somewhat hard   Food insecurity    Worry: Sometimes true    Inability: Sometimes true   Transportation needs    Medical: No    Non-medical: No  Tobacco Use   Smoking status: Former Smoker    Packs/day: 1.00     Years: 30.00    Pack years: 30.00    Types: Cigarettes    Quit date: 09/27/2017    Years since quitting: 1.7   Smokeless tobacco: Never Used  Substance and Sexual Activity   Alcohol use: Yes    Alcohol/week: 5.0 standard drinks    Types: 5 Shots of liquor per week    Frequency: Never    Comment: 5-6 shots of vodka daily; "stopped it all after I had stroke 09/27/2017"   Drug use: Yes    Types: Cocaine, Marijuana    Comment: 11/09/2017 "none since 09/27/2017"   Sexual activity: Not Currently  Lifestyle   Physical activity    Days per week: 0 days    Minutes per session: 0 min   Stress: To some extent  Relationships   Social connections    Talks on phone: More than three times a week    Gets together: More than three times a week    Attends religious service: 1 to 4 times per year    Active member of club or organization: No    Attends meetings of clubs or organizations: Never    Relationship status: Divorced  Other Topics Concern   Not on file  Social History Narrative   Not on file    Allergies:  Allergies  Allergen Reactions   Clindamycin/Lincomycin Swelling and Palpitations   Hydrocodone Hives   Lisinopril Swelling and Other (See Comments)    Facial swelling/angioedema   Olopatadine Hcl Swelling    Makes his face swell up   Prednisone Shortness Of Breath, Nausea Only, Swelling and Other (See Comments)    Also made chest feel tight and genital area, legs, and face became swollen badly   Lasix [Furosemide] Hives and Swelling    Facial swelling Reaction to name brand LASIX   Penicillins Hives and Swelling     Has patient had a PCN reaction causing immediate rash, facial/tongue/throat swelling, SOB or lightheadedness with hypotension: Yes Has patient had a PCN reaction causing severe rash involving mucus membranes or skin necrosis: No Has patient had a  PCN reaction that required hospitalization: No Has patient had a PCN reaction occurring within the last 10  years: No If all of the above answers are "NO", then may proceed with Cephalosporin use.    Bactrim [Sulfamethoxazole-Trimethoprim] Swelling and Rash    Facial swelling    Objective:    Vital Signs:   Temp:  [98.3 F (36.8 C)] 98.3 F (36.8 C) (10/27 0758) Pulse Rate:  [35-112] 93 (10/28 0700) Resp:  [18-33] 25 (10/28 0700) BP: (106-137)/(89-115) 124/97 (10/28 0700) SpO2:  [92 %-100 %] 100 % (10/28 0700) FiO2 (%):  [50 %] 50 % (10/28 0735)   There were no vitals filed for this visit.   Physical Exam     General:  On Bipap. Drowsy  HEENT: Normal Neck: Supple. JVP to jaw . Carotids 2+ bilat; no bruits. No lymphadenopathy or thyromegaly appreciated. Cor: PMI nondisplaced. Regular rate & rhythm. No rubs, or murmurs. +S3 Lungs: crackles on bipap Abdomen: Soft, nontender, nondistended. No hepatosplenomegaly. No bruits or masses. Good bowel sounds. Extremities: Cool, no cyanosis, clubbing, rash, R and LLE 1+ edema Neuro: Alert & oriented x3, cranial nerves grossly intact. moves all 4 extremities w/o difficulty.    Telemetry   NSR 90s personally reviewed   EKG   ST 110 bpm   Labs     Basic Metabolic Panel: Recent Labs  Lab 06/15/19 0421 06/16/19 0510 06/17/19 0430 06/17/19 0758 06/17/19 0802 06/18/19 0500 06/19/19 0516  NA 135 136 136 142 139 134* 131*  K 3.7 4.2 4.3 4.2 4.6 4.7 7.0*  CL 95* 99 98  --   --  100 94*  CO2 29 29 28   --   --  26 18*  GLUCOSE 109* 95 105*  --   --  157* 155*  BUN 15 20 25*  --   --  28* 33*  CREATININE 1.21 1.09 1.34*  --   --  1.31* 1.85*  CALCIUM 8.9 8.8* 8.9  --   --  9.1 10.2  MG  --   --  1.6*  --   --  2.0 2.3    Liver Function Tests: No results for input(s): AST, ALT, ALKPHOS, BILITOT, PROT, ALBUMIN in the last 168 hours. No results for input(s): LIPASE, AMYLASE in the last 168 hours. No results for input(s): AMMONIA in the last 168 hours.  CBC: Recent Labs  Lab 06/13/19 0500 06/15/19 0421 06/17/19 0430  06/17/19 0758 06/17/19 0802 06/19/19 0516  WBC 4.7 5.2 5.6  --   --  8.4  NEUTROABS  --   --   --   --   --  6.6  HGB 10.0* 10.2* 10.0* 11.2* 11.9* 11.5*  HCT 31.8* 32.3* 31.9* 33.0* 35.0* 39.0  MCV 81.5 80.8 81.2  --   --  85.3  PLT 199 251 254  --   --  295    Cardiac Enzymes: No results for input(s): CKTOTAL, CKMB, CKMBINDEX, TROPONINI in the last 168 hours.  BNP: BNP (last 3 results) Recent Labs    05/08/19 2018 06/09/19 1140 06/19/19 0516  BNP 3,017.6* 2,175.3* 2,045.3*    ProBNP (last 3 results) No results for input(s): PROBNP in the last 8760 hours.   CBG: Recent Labs  Lab 06/19/19 0443 06/19/19 0516 06/19/19 0628  GLUCAP 40* 84 107*    Coagulation Studies: Recent Labs    06/17/19 0430 06/18/19 0500  LABPROT 28.0* 30.6*  INR 2.7* 3.0*    Imaging: [facility]  Result Date: 06/19/2019 CLINICAL DATA:  Chest pain and shortness of breath this morning. EXAM: PORTABLE CHEST 1 VIEW COMPARISON:  Chest x-ray 06/09/2019 and chest CT 04/13/2019 FINDINGS: Borderline heart size, upper limits of normal given the AP projection and portable technique. The mediastinal and hilar contours are within normal limits. The lungs are clear of an acute process. Significant artifact over the right chest from tubing, wires and lines. No obvious pulmonary lesions. Stable underlying emphysematous changes with hyperinflation. The bony thorax is intact. IMPRESSION: No acute cardiopulmonary findings. Electronically Signed   By: Rudie Meyer M.D.   On: 06/19/2019 06:14   [place] Ekg Site Rite  Result Date: 06/19/2019 If Site Rite image not attached, placement could not be confirmed due to current cardiac rhythm.      Assessment/Plan   1. Cardiogenic Shock, Biventricular HF ECHO LV 20-25% RV severely reduced.  -Lactic Acid 8.  -Recurrent cardiogenic shock in the setting of biventricular heart failure. He was deemed high risk for VAD at [facility] due to  Poor PFTs, RV  failure, and social barriers.  Discharged yesterday off milrinone 0.375 mcg. Restart milrinone 0.375 mcg. Place PICC line Give 80 mg IV lasix now.  He is interested in aggressive care and would like transfer to [facility] for possible LVAD. We will reach out to them to see if they would consider. If not he will need Palliative Care for goals of care. Check UDS.    2. Hyperkalemia K 7. K corrected in the ED . Repeat BMET   3. Acute Respiratory Failure On Bipap. Wean as tolerated.   4. AKI on CKD Stage III Creatinine up 1.3>1.85  5. Apical Thrombus  On coumadin  5. H/O MCA CVA 2019  6. Full Code   Tonye Becket, NP 06/19/2019, 7:39 AM  Advanced Heart Failure Team Pager [phone] (M-F; 7a - 4p)  Please contact [facility] for night-coverage after hours (4p -7a ) and weekends on [email or web address]  Patient seen with NP, agree with the above note.   He left AMA yesterday, now has returned to ER with recurrent cardiogenic shock off milrinone.  Lactate 8.1 this morning, creatinine up to 1.85.   He is now comfortable on Bipap.   General: NAD on Bipap Neck: JVP 14+ cm, no thyromegaly or thyroid nodule.  Lungs: Clear to auscultation bilaterally with normal respiratory effort. CV: Nondisplaced PMI.  Heart regular S1/S2, +S3, no murmur.  1+ ankle edema.   Abdomen: Soft, nontender, no hepatosplenomegaly, no distention.  Skin: Intact without lesions or rashes.  Neurologic: Alert and oriented x 3.  Psych: Normal affect. Extremities: No clubbing or cyanosis.  HEENT: Normal.   Recurrent cardiogenic shock with end stage CHF.  Echo with biventricular failure.  - Restart milrinone 0.375, will place PICC.  - Got Lasix 80 mg IV x 1, start Lasix 12 mg/hr.   AKI likely cardiorenal.  Treated hyperkalemia in ER, repeat BMET later this morning.   Not LVAD candidate at [facility], will need discussion with [facility] to see if he will transfer there for evaluation.   [name] 06/19/2019 9:16 AM

## 2019-06-19 NOTE — Progress Notes (Signed)
Peripherally Inserted Central Catheter/Midline Placement  The IV Nurse has discussed with the patient and/or persons authorized to consent for the patient, the purpose of this procedure and the potential benefits and risks involved with this procedure.  The benefits include less needle sticks, lab draws from the catheter, and the patient may be discharged home with the catheter. Risks include, but not limited to, infection, bleeding, blood clot (thrombus formation), and puncture of an artery; nerve damage and irregular heartbeat and possibility to perform a PICC exchange if needed/ordered by physician.  Alternatives to this procedure were also discussed.  Bard Power PICC patient education guide, fact sheet on infection prevention and patient information card has been provided to patient /or left at bedside.    PICC/Midline Placement Documentation  PICC Double Lumen 06/19/19 PICC Right Brachial 41 cm 0 cm (Active)  Indication for Insertion or Continuance of Line Vasoactive infusions 06/19/19 1026  Exposed Catheter (cm) 0 cm 06/19/19 1026  Site Assessment Clean;Intact;Dry 06/19/19 1026  Lumen #1 Status Flushed;Blood return noted 06/19/19 1026  Lumen #2 Status Flushed;Blood return noted 06/19/19 1026  Dressing Type Transparent 06/19/19 1026  Dressing Status Clean;Dry;Intact;Antimicrobial disc in place;Other (Comment) 06/19/19 1026  Dressing Intervention New dressing 06/19/19 1026  Dressing Change Due 06/26/19 06/19/19 1026       Mark [name] 06/19/2019, 10:27 AM

## 2019-06-19 NOTE — ED Provider Notes (Signed)
[name]   CSN: [id number] Arrival date & time: 06/19/19  [id number]    History   Chief Complaint No chief complaint on file.   HPI Mark [name] is a 43 y.o. male.   The history is provided by the patient.  He has history of nonischemic cardiomyopathy with systolic heart failure, COPD and was discharged from the hospital yesterday and states that he got short of breath this evening.  He denies chest pain.  He has been diaphoretic.  He was brought in by ambulance.  He denies cough, nausea, vomiting.  He states that he was supposed to get a heart stent but they were not able to because his heart was too bad.  Past Medical History:  Diagnosis Date   Asthma    Chronic systolic CHF (congestive heart failure) (HCC)    Cigarette smoker    CKD (chronic kidney disease), stage II    /notes 10/01/2017   COPD (chronic obstructive pulmonary disease) (HCC) 10/21/2017   on CT scan chest   History of echocardiogram    a. Echo 5/17 - EF 20-25%, severe diff HK, restrictive physiology, mild to mod MR, severe reduced RVSF, mod RVE, mild RAE, mod TR, PASP 48 mmHg   Hx of cardiac cath    a. LHC 5/17 - normal coronary arteries. PA 45/25, mean 33, PCWP mean 18   NICM (nonischemic cardiomyopathy) (HCC)    Stroke (HCC) 09/27/2017   "was weak on my left side; I'm fully recovered" (11/09/2017)   Substance abuse (HCC)    cocaine, marijuana    Patient Active Problem List   Diagnosis Date Noted   Right middle lobe pneumonia 06/09/2019   Polysubstance abuse (HCC) 06/09/2019   Chronic hyponatremia 06/09/2019   Malnutrition of moderate degree 04/10/2019   Cardiogenic shock (HCC) 04/09/2019   Allergic reaction caused by a drug 02/14/2019   Abnormal liver function 02/14/2019   Encounter for therapeutic drug monitoring 02/13/2019   Encounter to establish care 02/13/2019   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   Acute CHF (congestive  heart failure) (HCC) 01/24/2019   Shortness of breath    Cocaine use    Acute on chronic systolic (congestive) heart failure (HCC) 12/25/2018   Demand ischemia (HCC)    LV (left ventricular) mural thrombus without MI (HCC)    Noncompliance    Chronic anemia    Acute systolic CHF (congestive heart failure) (HCC) 11/09/2017   History of right MCA stroke 09/28/2017   Stroke (HCC) 09/28/2017   NICM (nonischemic cardiomyopathy) (HCC) 01/11/2016   CKD (chronic kidney disease) stage 2, GFR 60-89 ml/min 01/11/2016   Cocaine abuse (HCC) 01/11/2016   Chest pain, pleuritic 01/03/2016   Tobacco abuse 01/03/2016   Asthma 01/03/2016   Leg swelling 01/03/2016   Tachycardia 01/03/2016   Normocytic anemia 01/03/2016   Elevated troponin I level 01/03/2016   Right rib fracture 01/03/2016   Chest pain     Past Surgical History:  Procedure Laterality Date   CARDIAC CATHETERIZATION N/A 01/05/2016   Procedure: Right/Left Heart Cath and Coronary Angiography;  Surgeon: Lennette Biharihomas A Kelly, MD;  Location: [facility] INVASIVE [facility];  Service: Cardiovascular;  Laterality: N/A;   MULTIPLE EXTRACTIONS WITH ALVEOLOPLASTY Bilateral 04/11/2019   Procedure: Extraction of tooth #'s 2, 4, 5, 10, and 12 with alveoloplasty;  Surgeon: Charlynne PanderKulinski, Ronald F, DDS;  Location: [facility] OR;  Service: Oral Surgery;  Laterality: Bilateral;   RIGHT HEART CATH N/A 04/09/2019   Procedure: RIGHT  HEART CATH;  Surgeon: Jolaine Artist, MD;  Location: [place] [facility];  Service: Cardiovascular;  Laterality: N/A;   RIGHT HEART CATH N/A 06/17/2019   Procedure: RIGHT HEART CATH;  Surgeon: Jolaine Artist, MD;  Location: [place];  Service: Cardiovascular;  Laterality: N/A;        Home Medications    Prior to Admission medications   Medication Sig Start Date End Date Taking? Authorizing Provider  albuterol (VENTOLIN HFA) 108 (90 Base) MCG/ACT inhaler Inhale 1-2 puffs into the lungs every 4 (four) hours as  needed for wheezing or shortness of breath. 04/26/19   Charlott Rakes, MD  aspirin 81 MG EC tablet Take 1 tablet (81 mg total) by mouth daily. Patient taking differently: Take 162 mg by mouth daily.  12/30/18   Sheikh, Omair Latif, DO  budesonide-formoterol (SYMBICORT) 80-4.5 MCG/ACT inhaler Inhale 2 puffs into the lungs 2 (two) times daily. 04/13/19   Theora Gianotti, NP  digoxin (LANOXIN) 0.125 MG tablet Take 1 tablet (0.125 mg total) by mouth daily. 12/30/18   Raiford Noble Latif, DO  furosemide (LASIX) 80 MG tablet Take 1 tablet (80 mg total) by mouth every morning AND 0.5 tablets (40 mg total) every evening. Patient taking differently: Take 1 tablet (80 mg total) by mouth every morning AND 0.5 tablet (40 mg) in the evening as needed for swelling 05/20/19   Clegg, Amy D, NP  Menthol-Methyl Salicylate (MUSCLE RUB) 10-15 % CREA Apply 1 application topically daily as needed for muscle pain.    [provider]  Multiple Vitamin (MULTIVITAMIN WITH MINERALS) TABS tablet Take 1 tablet by mouth daily.    [provider]  OVER THE COUNTER MEDICATION Place 1 spray into both nostrils daily as needed (congestion). OTC  Nasal spray    [provider]  potassium chloride SA (K-DUR) 20 MEQ tablet Take 1 tablet (20 mEq total) by mouth daily. Take 11meq with Metolazone. Patient taking differently: Take 20 mEq by mouth daily.  03/08/19   Bensimhon, Shaune Pascal, MD  spironolactone (ALDACTONE) 25 MG tablet Take 1 tablet (25 mg total) by mouth daily. 05/14/19   Darrick Grinder D, NP  warfarin (COUMADIN) 5 MG tablet 5 mg daily except 2.5 mg on Tuesday Patient taking differently: Take 2.5-5 mg by mouth See admin instructions. Take 1/2 tablet (2.5 mg) by mouth on Tuesday evening at 6pm, take 1 tablet (5 mg) on all other evenings of the week - at 6pm 05/13/19   Darrick Grinder D, NP    Family History Family History  Problem Relation Age of Onset   Cardiomyopathy Father        Reports his father has  an LVAD   Heart failure Father    Hypertension Father    Heart disease Maternal Grandmother        had a whole in her heart   Deep vein thrombosis Neg Hx     Social History Social History   Tobacco Use   Smoking status: Former Smoker    Packs/day: 1.00    Years: 30.00    Pack years: 30.00    Types: Cigarettes    Quit date: 09/27/2017    Years since quitting: 1.7   Smokeless tobacco: Never Used  Substance Use Topics   Alcohol use: Yes    Alcohol/week: 5.0 standard drinks    Types: 5 Shots of liquor per week    Frequency: Never    Comment: 5-6 shots of vodka daily; "stopped  it all after I had stroke 09/27/2017"   Drug use: Yes    Types: Cocaine, Marijuana    Comment: 11/09/2017 "none since 09/27/2017"     Allergies   Clindamycin/lincomycin, Hydrocodone, Lisinopril, Olopatadine hcl, Prednisone, Lasix [furosemide], Penicillins, and Bactrim [sulfamethoxazole-trimethoprim]   Review of Systems Review of Systems  All other systems reviewed and are negative.    Physical Exam Updated Vital Signs BP (!) 137/115    Pulse 100    Resp (!) 32    SpO2 100%   Physical Exam Vitals signs and nursing note reviewed.    43 year old male, diaphoretic and with labored respirations. Vital signs are significant for elevated respiratory rate and a diastolic blood pressure. Oxygen saturation is 100%, which is normal. Head is normocephalic and atraumatic. PERRLA, EOMI. Oropharynx is clear. Neck is nontender and supple without adenopathy or JVD. Back is nontender and there is no CVA tenderness. Lungs are clear without rales, wheezes, or rhonchi. Chest is nontender. Heart has regular rate and rhythm without murmur. Abdomen is soft, flat, nontender without masses or hepatosplenomegaly and peristalsis is normoactive. Extremities have no cyanosis or edema, full range of motion is present. Skin is diaphoretic without rash. Neurologic: Mental status is normal, cranial nerves are intact,  there are no motor or sensory deficits.  ED Treatments / Results  Labs (all labs ordered are listed, but only abnormal results are displayed) Labs Reviewed  BASIC METABOLIC PANEL - Abnormal; Notable for the following components:      Result Value   Sodium 131 (*)    Potassium 7.0 (*)    Chloride 94 (*)    CO2 18 (*)    Glucose, Bld 155 (*)    BUN 33 (*)    Creatinine, Ser 1.85 (*)    GFR calc non Af Amer 43 (*)    GFR calc Af Amer 50 (*)    Anion gap 19 (*)    All other components within normal limits  LACTIC ACID, PLASMA - Abnormal; Notable for the following components:   Lactic Acid, Venous 8.1 (*)    All other components within normal limits  CBC WITH DIFFERENTIAL/PLATELET - Abnormal; Notable for the following components:   Hemoglobin 11.5 (*)    MCH 25.2 (*)    MCHC 29.5 (*)    RDW 17.7 (*)    All other components within normal limits  BRAIN NATRIURETIC PEPTIDE - Abnormal; Notable for the following components:   B Natriuretic Peptide 2,045.3 (*)    All other components within normal limits  CBG MONITORING, ED - Abnormal; Notable for the following components:   Glucose-Capillary 40 (*)    All other components within normal limits  CBG MONITORING, ED - Abnormal; Notable for the following components:   Glucose-Capillary 107 (*)    All other components within normal limits  TROPONIN I (HIGH SENSITIVITY) - Abnormal; Notable for the following components:   Troponin I (High Sensitivity) 35 (*)    All other components within normal limits  SARS CORONAVIRUS 2 (TAT 6-24 HRS)  MAGNESIUM  DIGOXIN LEVEL  LACTIC ACID, PLASMA  PROTIME-INR  HEPATIC FUNCTION PANEL  COOXEMETRY PANEL  RAPID URINE DRUG SCREEN, HOSP PERFORMED  BASIC METABOLIC PANEL  NICOTINE/COTININE METABOLITES  CBG MONITORING, ED  CBG MONITORING, ED  TROPONIN I (HIGH SENSITIVITY)    EKG EKG Interpretation  Date/Time:  Wednesday June 19 2019 04:38:07 EDT Ventricular Rate:  110 PR Interval:    QRS  Duration: 133 QT Interval:  356  QTC Calculation: 482 R Axis:   -138 Text Interpretation: Sinus tachycardia Consider right atrial enlargement Nonspecific intraventricular conduction delay Tall T waves, consider hyperkalemia When compared with ECG of 06/09/2019, Tall T waves are now present in V4, V% concerning for hyperkalemia Confirmed by Dione Booze ([id number]) on 06/19/2019 4:50:22 AM   Radiology Dg Chest Port 1 View  Result Date: 06/19/2019 CLINICAL DATA:  Chest pain and shortness of breath this morning. EXAM: PORTABLE CHEST 1 VIEW COMPARISON:  Chest x-ray 06/09/2019 and chest CT 04/13/2019 FINDINGS: Borderline heart size, upper limits of normal given the AP projection and portable technique. The mediastinal and hilar contours are within normal limits. The lungs are clear of an acute process. Significant artifact over the right chest from tubing, wires and lines. No obvious pulmonary lesions. Stable underlying emphysematous changes with hyperinflation. The bony thorax is intact. IMPRESSION: No acute cardiopulmonary findings. Electronically Signed   By: Rudie Meyer M.D.   On: 06/19/2019 06:14   [place] Ekg Site Rite  Result Date: 06/19/2019 If Site Rite image not attached, placement could not be confirmed due to current cardiac rhythm.   Procedures Procedures  CRITICAL CARE Performed by: Dione Booze Total critical care time: 95 minutes Critical care time was exclusive of separately billable procedures and treating other patients. Critical care was necessary to treat or prevent imminent or life-threatening deterioration. Critical care was time spent personally by me on the following activities: development of treatment plan with patient and/or surrogate as well as nursing, discussions with consultants, evaluation of patient's response to treatment, examination of patient, obtaining history from patient or surrogate, ordering and performing treatments and interventions, ordering and review of  laboratory studies, ordering and review of radiographic studies, pulse oximetry and re-evaluation of patient's condition.  Medications Ordered in ED Medications  sodium zirconium cyclosilicate (LOKELMA) packet 10 g (10 g Oral Not Given 06/19/19 0723)  milrinone (PRIMACOR) 20 MG/100 ML (0.2 mg/mL) infusion (has no administration in time range)  furosemide (LASIX) injection 80 mg (has no administration in time range)  enoxaparin (LOVENOX) injection 40 mg (has no administration in time range)  dextrose 50 % solution 50 mL (50 mLs Intravenous Given 06/19/19 0450)  calcium gluconate inj 10% (1 g) URGENT USE ONLY! (1 g Intravenous Given 06/19/19 0656)  dextrose 50 % solution 50 mL (50 mLs Intravenous Given 06/19/19 0704)  sodium bicarbonate injection 50 mEq (50 mEq Intravenous Given 06/19/19 0704)  insulin aspart (novoLOG) injection 10 Units (10 Units Intravenous Given 06/19/19 0715)     Initial Impression / Assessment and Plan / ED Course  I have reviewed the triage vital signs and the nursing notes.  Pertinent labs & imaging results that were available during my care of the patient were reviewed by me and considered in my medical decision making (see chart for details).  Respiratory distress and patient with known severe systolic heart failure.  Old records were reviewed and he had a right heart catheterization during his recent hospitalization which showed elevated filling pressures, echocardiogram showed ejection fraction 20-25%.  He appeared to be tiring and was immediately placed on BiPAP.  Blood glucose was noted to be 40 and is given D50.  With commendation of being on BiPAP and getting dextrose, he states that he is feeling somewhat better.  Will check chest x-ray and screening labs.  ECG does show tall T waves in V4 and V5 concerning for hyperkalemia, but it is noted that he had a normal potassium of 4.7 yesterday and creatinine  of 1.3 so significant hyperkalemia is unlikely even though he  is on oral potassium and spironolactone.    Potassium has come back elevated at 7.0 with associated acute kidney injury (creatinine increased from 1.31 yesterday to 1.85 today with associated drop in CO2 from 26 to 18.  BNP is markedly elevated but essentially unchanged from baseline.  Mild hyponatremia is present and not felt to be clinically significant.  Mild anemia is present and unchanged from baseline.  He is given emergent treatment for hyperkalemia with intravenous glucose, insulin, calcium, sodium bicarbonate as well as oral sodium zirconium cyclosilicate.  Of note, chest x-ray shows no acute process and digoxin level is therapeutic.  Patient continues to be resting comfortably.  He is no longer diaphoretic and continues to maintain adequate oxygen saturation while on BiPAP.  Case is discussed with Dr. Herbie Baltimore of cardiology service who agrees to admit the patient.  Because of renal insufficiency is likely withdrawal from milrinone and hypoperfusion.  Will check hepatic function panel to see if there is any liver injury that might account for his hypoglycemia.   Final Clinical Impressions(s) / ED Diagnoses   Final diagnoses:  Hypoglycemia  Respiratory distress  Hyperkalemia  Acute kidney injury (nontraumatic) (HCC)  Chronic combined systolic and diastolic congestive heart failure (HCC)  Hyponasality  Normocytic anemia    ED Discharge Orders    None       Dione Booze, MD 06/19/19 [phone]

## 2019-06-20 DIAGNOSIS — R570 Cardiogenic shock: Secondary | ICD-10-CM | POA: Diagnosis not present

## 2019-06-20 LAB — PROTIME-INR
INR: 2.3 — ABNORMAL HIGH (ref 0.8–1.2)
Prothrombin Time: 25 seconds — ABNORMAL HIGH (ref 11.4–15.2)

## 2019-06-20 LAB — BASIC METABOLIC PANEL
Anion gap: 10 (ref 5–15)
BUN: 32 mg/dL — ABNORMAL HIGH (ref 6–20)
CO2: 31 mmol/L (ref 22–32)
Calcium: 9.3 mg/dL (ref 8.9–10.3)
Chloride: 92 mmol/L — ABNORMAL LOW (ref 98–111)
Creatinine, Ser: 1.36 mg/dL — ABNORMAL HIGH (ref 0.61–1.24)
GFR calc Af Amer: >60 mL/min (ref >60)
GFR calc non Af Amer: >60 mL/min (ref >60)
Glucose, Bld: 122 mg/dL — ABNORMAL HIGH (ref 70–99)
Potassium: 4.2 mmol/L (ref 3.5–5.1)
Sodium: 133 mmol/L — ABNORMAL LOW (ref 135–145)

## 2019-06-20 LAB — COOXEMETRY PANEL
Carboxyhemoglobin: 0.7 % (ref 0.5–1.5)
Methemoglobin: 0.6 % (ref 0.0–1.5)
O2 Saturation: 49.7 %
Total hemoglobin: 16 g/dL (ref 12.0–16.0)

## 2019-06-20 LAB — GLUCOSE, CAPILLARY: Glucose-Capillary: 102 mg/dL — ABNORMAL HIGH (ref 70–99)

## 2019-06-20 MED ORDER — LEVALBUTEROL HCL 0.63 MG/3ML IN NEBU
0.6300 mg | INHALATION_SOLUTION | Freq: Two times a day (BID) | RESPIRATORY_TRACT | Status: DC
  Administered 2019-06-20 – 2019-06-22 (×4): 0.63 mg via RESPIRATORY_TRACT
  Filled 2019-06-20 (×4): qty 3

## 2019-06-20 MED ORDER — FUROSEMIDE 80 MG PO TABS
80.0000 mg | ORAL_TABLET | Freq: Every day | ORAL | Status: DC
  Administered 2019-06-21: 80 mg via ORAL
  Filled 2019-06-20 (×2): qty 1

## 2019-06-20 MED ORDER — WARFARIN SODIUM 5 MG PO TABS
5.0000 mg | ORAL_TABLET | ORAL | Status: DC
  Administered 2019-06-20 – 2019-06-24 (×5): 5 mg via ORAL
  Filled 2019-06-20 (×5): qty 1

## 2019-06-20 MED ORDER — CHLORHEXIDINE GLUCONATE CLOTH 2 % EX PADS
6.0000 | MEDICATED_PAD | Freq: Every day | CUTANEOUS | Status: DC
  Administered 2019-06-20 – 2019-06-23 (×4): 6 via TOPICAL

## 2019-06-20 MED ORDER — WARFARIN - PHARMACIST DOSING INPATIENT
Freq: Every day | Status: DC
  Administered 2019-06-23: 19:00:00

## 2019-06-20 MED ORDER — WARFARIN SODIUM 2.5 MG PO TABS: 2.5000 mg | ORAL_TABLET | ORAL | Status: DC

## 2019-06-20 NOTE — Progress Notes (Signed)
   CVP down to 7.   Stop lasix drip. Start lasix 80 mg po daily.   Amy Clegg NP-C  1:39 PM

## 2019-06-20 NOTE — Progress Notes (Signed)
LVAD Initial Psychosocial Screening  Date/Time Initiated:  06-17-19 1:20pm Referral Source:  Mark [name], VAD Coordinator Referral Reason:  LVAD Implantation  Source of Information:  Pt and chart review  Demographics Name:  Mark [name] Address:  [address] [place] [address] Home phone:     Cell: [phone] Marital Status:  Divorced  Faith:  Christianity Primary Language:  english DOB:  1976-07-07  Medical & Follow-up Adherence to Medical regimen/INR checks: Other:CSW discussed history of non adherence to the plan of care.   Medication adherence: Other:CSW discussed concerns with follow up  Physician/Clinic Appointment Attendance: Other:CSW discussed importance of follow up Comments:    Advance Directives: Do you have a Living Will or Medical POA? No  Would you like to complete a Living Will and Medical POA prior to surgery?  Yes Do you have Goals of Care? Yes  Have you had a consult with the Palliative Care Team at [facility]? No  Psychological Health Appearance:  In hospital gown Mental Status:  Alert, oriented Eye Contact:  Fair Thought Content:  Coherent Speech:  Unremarkable Mood:  Pleasant  Affect:  Appropriate to circumstance Insight:  Fair Judgement: Unimpaired Interaction Style:  Engaged  Family/Social Information Who lives in your home? Name:   Relationship:   Mark [name]  girlfriend  Other family members/support persons in your life? Name:   Relationship:   Mark [name]   Sister  Caregiving Needs Who is the primary caregiver? (Patient unsure at the time of assessment) Health status:   Do you drive?   Do you work?   Physical Limitations:   Do you have other care giving responsibilities?   Contact number:  Who is the secondary caregiver? Health status:   Do you drive?   Do you work?   Physical Limitations:   Do you have other care giving responsibilities?   Contact number:  Home Environment/Personal Care Do you have reliable phone service?  Yes  If so, what is the number?  [phone] Do you own or rent your home? Mark [name]'s home  Current mortgage/rent: patient unsure Number of steps into the home? 2 steps How many levels in the home? 1 level Assistive devices in the home? none Electrical needs for LVAD (3 prong outlets)? yes Second hand smoke exposure in the home? no Travel distance from [facility]? 10 minutes  Community Are you active with community agencies/resources/homecare? Yes Agency Name: [place] you active in a church, synagogue, mosque or other faith based community? Yes Faith based institutions name: [employer] What other sources do you have for spiritual support? family Are you active in any clubs or social organizations? no What do you do for fun?  Hobbies?  Interests? Likes to draw  Education/Work Information What is the last grade of school you completed?  11th Preferred method of learning?  Hands on Do you have any problems with reading or writing?  No Are you currently employed?  No  When were you last employed? 2-3 years ago  Name of employer? 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 Few months If you are not working, do you plan to return to work after VAD surgery? No If yes, what type of employment do you hope to find? Are you interested in job training or learning new skills?  No Did you serve in the [place]? No  If so, what branch? Other  Financial Information What is your source of income? No  income Do you have difficulty meeting your monthly expenses? Yes If yes, which ones? all How do you cope with this? Rely on Mark [name] and family Can you budget for the monthly cost for dressing supplies post procedure? Yes   Primary Health insurance:  Medicaid Secondary Insurance: Prescription plan: Pharmacy:   What are your prescription co-pays? varies Do you use mail order for your prescriptions?  No Have you ever had to  refuse medication due to cost?  No Have you applied for Medicaid?  yes Have you applied for Social Security Disability (SSI)   Yes  Medical Information Briefly describe why you are here for evaluation: need an LVAD Do you have a PCP or other medical provider? Mark Sessions, NP Are you able to complete your ADL's?  yes Do you have a history of trauma, physical, emotional, or sexual abuse? no Do you have any family history of heart problems? Yes, his biological father has an LVAD Do you smoke now or past usage? Yes    3 cigarettes a day Do you drink alcohol now or past usage? Yes    socially  Are you currently using illegal drugs or misuse of medication or past usage? past usage cocaine 6-7 months ago  Have you ever been treated for substance abuse? No      If yes, where and when did you receive treatment?  Mental Health History How have you been feeling in the past year? Feeling bad Have you ever had any problems with depression, anxiety or other mental health issues? no Do you see a counselor, psychiatrist or therapist?  no If you are currently experiencing problems are you interested in talking with a professional? No Have you or are you taking medications for anxiety/depression or any mental health concerns?  No  Current Medications: What are your coping strategies under stressful situations? Draw/ sit out in the park Are there any other stressors in your life? Pending disability Have you had any past or current thoughts of suicide? no  LVAD Psychosocial assessment not completed as patient became fatigued and will complete at a later time.  Mark Beech, LCSW, CCSW-MCS [phone]   Post MRB: Patient determined to not be a candidate for LVAD implantation due to medical reasons. CSW will assist further with disability application through the HF clinic.   Mark [name], CCSW-MCS [phone]

## 2019-06-20 NOTE — Progress Notes (Addendum)
Advanced Heart Failure Rounding Note  PCP-Cardiologist: Lauree Chandler, MD   Subjective:   Admitted with recurrent cardiogenic shock. Started back on milrinone 0.375 mcg and diuresed with IV lasix. Brisk diuresis noted.   Remains on milrinone 0.375 mcg + lasix drip at 12 mg per hour. Co-ox 49%  Feeling better today. Denies SOB.    Objective:   Weight Range: 60.9 kg Body mass index is 19.83 kg/m.   Vital Signs:   Temp:  [96.9 F (36.1 C)-97.9 F (36.6 C)] 97.7 F (36.5 C) (10/29 0428) Pulse Rate:  [70-108] 95 (10/29 0600) Resp:  [13-26] 21 (10/29 0600) BP: (103-148)/(65-115) 114/65 (10/29 0600) SpO2:  [93 %-100 %] 96 % (10/29 0600) FiO2 (%):  [40 %-50 %] 40 % (10/29 0016) Weight:  [60.9 kg-67.8 kg] 60.9 kg (10/29 0329) Last BM Date: 06/18/19  Weight change: Filed Weights   06/19/19 0824 06/20/19 0329  Weight: 67.8 kg 60.9 kg    Intake/Output:   Intake/Output Summary (Last 24 hours) at 06/20/2019 0725 Last data filed at 06/20/2019 0600 Gross per 24 hour  Intake 753.38 ml  Output 6175 ml  Net -5421.62 ml      Physical Exam   CVP 9 personally checked.  General:  Sitting on the side of the bed. . No resp difficulty HEENT: Normal Neck: Supple. JVP 8-9  Carotids 2+ bilat; no bruits. No lymphadenopathy or thyromegaly appreciated. Cor: PMI nondisplaced. Regular rate & rhythm. No rubs, gallops or murmurs. Lungs: Clear Abdomen: Soft, nontender, nondistended. No hepatosplenomegaly. No bruits or masses. Good bowel sounds. Extremities: No cyanosis, clubbing, rash, edema. RUE PICC  Neuro: Alert & orientedx3, cranial nerves grossly intact. moves all 4 extremities w/o difficulty. Affect pleasant   Telemetry   Sinus tach 100s personally reviewed  EKG    n/a Labs    CBC Recent Labs    06/19/19 0516 06/19/19 0931  WBC 8.4 11.2*  NEUTROABS 6.6  --   HGB 11.5* 10.7*  HCT 39.0 33.6*  MCV 85.3 80.6  PLT 295 500   Basic Metabolic Panel Recent Labs     06/18/19 0500 06/19/19 0516 06/19/19 0931 06/20/19 0305  NA 134* 131* 136 133*  K 4.7 7.0* 4.8 4.2  CL 100 94* 99 92*  CO2 26 18* 25 31  GLUCOSE 157* 155* 56* 122*  BUN 28* 33* 31* 32*  CREATININE 1.31* 1.85* 1.39* 1.36*  CALCIUM 9.1 10.2 10.0 9.3  MG 2.0 2.3  --   --    Liver Function Tests Recent Labs    06/19/19 0931  AST 52*  ALT 29  ALKPHOS 115  BILITOT 2.4*  PROT 8.7*  ALBUMIN 3.6   No results for input(s): LIPASE, AMYLASE in the last 72 hours. Cardiac Enzymes No results for input(s): CKTOTAL, CKMB, CKMBINDEX, TROPONINI in the last 72 hours.  BNP: BNP (last 3 results) Recent Labs    05/08/19 2018 06/09/19 1140 06/19/19 0516  BNP 3,017.6* 2,175.3* 2,045.3*    ProBNP (last 3 results) No results for input(s): PROBNP in the last 8760 hours.   D-Dimer No results for input(s): DDIMER in the last 72 hours. Hemoglobin A1C No results for input(s): HGBA1C in the last 72 hours. Fasting Lipid Panel No results for input(s): CHOL, HDL, LDLCALC, TRIG, CHOLHDL, LDLDIRECT in the last 72 hours. Thyroid Function Tests No results for input(s): TSH, T4TOTAL, T3FREE, THYROIDAB in the last 72 hours.  Invalid input(s): FREET3  Other results:   Imaging    [place] Ekg Site  Rite  Result Date: 06/19/2019 If Site Rite image not attached, placement could not be confirmed due to current cardiac rhythm.     Medications:     Scheduled Medications: . chlorhexidine  15 mL Mouth Rinse BID  . Chlorhexidine Gluconate Cloth  6 each Topical Q0600  . levalbuterol  0.63 mg Nebulization TID  . mouth rinse  15 mL Mouth Rinse q12n4p  . sodium chloride flush  10-40 mL Intracatheter Q12H  . sodium chloride flush  3 mL Intravenous Q12H     Infusions: . sodium chloride    . furosemide (LASIX) infusion 12 mg/hr (06/20/19 0600)  . milrinone 0.375 mcg/kg/min (06/20/19 0600)     PRN Medications:  sodium chloride, acetaminophen, ondansetron (ZOFRAN) IV, sodium chloride flush,  sodium chloride flush    Assessment/Plan  1. Cardiogenic Shock, Biventricular HF ECHO LV 20-25% RV severely reduced.  -Lactic Acid 8.  -Recurrent cardiogenic shock in the setting of biventricular heart failure. He was deemed high risk for VAD at [facility] due to  Poor PFTs, RV failure, and social barriers.  Discharged 10/27 off milrinone 0.375 mcg.  - CO-OX 49.7 on milrinone 0.375 mcg. - CVP down to 9. Continue lasix drip at 12 mg per hour. Renal function better.  He is interested in aggressive care and would like transfer to [facility] for possible LVAD. He is interested in home milrinone if that is his only option. We will reach out to them to see if they would consider. If not he will need Palliative Care for goals of care. Check UDS- negative   2. Hyperkalemia K 7. K corrected in the ED .  K stable today.     3. Acute Respiratory Failure On Bipap. Weaned to room air.   4. AKI on CKD Stage III Creatinine up 1.3>1.85>1.36  5. Apical Thrombus  On coumadin  5. H/O MCA CVA 2019  6. Full Code  Transfer to [facility].     Length of Stay: 1  Amy Clegg, NP  06/20/2019, 7:25 AM  Advanced Heart Failure Team Pager [phone] (M-F; 7a - 4p)  Please contact [facility] for night-coverage after hours (4p -7a ) and weekends on [email or web address]  Patient seen and examined with the above-signed Advanced Practice Provider and/or Housestaff. I personally reviewed laboratory data, imaging studies and relevant notes. I independently examined the patient and formulated the important aspects of the plan. I have edited the note to reflect any of my changes or salient points. I have personally discussed the plan with the patient and/or family.  He remains very tenuous on IV milrinone. Co-ox still 49%. Renal function has normalized. Feels better. No CP or SOB. Refusing lasix at times. I have discussed his case with Dr. [name] at [facility] and felt not to be VAD candidate due to RV failure, PFTs and  compliance issues.  He has chosen to go home on home milrinone for palliative purposes. He realizes that this benefit may not last more than a few weeks.   Arvilla Meres, MD  11:12 AM

## 2019-06-20 NOTE — Progress Notes (Signed)
ANTICOAGULATION CONSULT NOTE - Initial Consult  Pharmacy Consult for warfarin  Indication: apical thrombus  Allergies  Allergen Reactions  . Clindamycin/Lincomycin Swelling and Palpitations  . Hydrocodone Hives  . Lisinopril Swelling and Other (See Comments)    Facial swelling/angioedema  . Olopatadine Hcl Swelling    Makes his face swell up  . Prednisone Shortness Of Breath, Nausea Only, Swelling and Other (See Comments)    Also made chest feel tight and genital area, legs, and face became swollen badly  . Lasix [Furosemide] Hives and Swelling    Facial swelling Reaction to name brand LASIX  . Penicillins Hives and Swelling     Has patient had a PCN reaction causing immediate rash, facial/tongue/throat swelling, SOB or lightheadedness with hypotension: Yes Has patient had a PCN reaction causing severe rash involving mucus membranes or skin necrosis: No Has patient had a PCN reaction that required hospitalization: No Has patient had a PCN reaction occurring within the last 10 years: No If all of the above answers are "NO", then may proceed with Cephalosporin use.   . Bactrim [Sulfamethoxazole-Trimethoprim] Swelling and Rash    Facial swelling    Patient Measurements: Height: 5\' 9"  (175.3 cm) Weight: 134 lb 4.2 oz (60.9 kg) IBW/kg (Calculated) : 70.7  Vital Signs: Temp: 97.7 F (36.5 C) (10/29 1158) Temp Source: Axillary (10/29 1158) BP: 107/83 (10/29 1000) Pulse Rate: 101 (10/29 1200)  Labs: Recent Labs    06/18/19 0500 06/19/19 0516 06/19/19 0931 06/20/19 0305 06/20/19 0830  HGB  --  11.5* 10.7*  --   --   HCT  --  39.0 33.6*  --   --   PLT  --  295 248  --   --   LABPROT 30.6* 34.5*  --   --  25.0*  INR 3.0* 3.5*  --   --  2.3*  CREATININE 1.31* 1.85* 1.39* 1.36*  --   TROPONINIHS  --  35* 31*  --   --     Estimated Creatinine Clearance: 59.1 mL/min (A) (by C-G formula based on SCr of 1.36 mg/dL (H)).   Medical History: Past Medical History:   Diagnosis Date  . Asthma   . Chronic systolic CHF (congestive heart failure) ([place])   . Cigarette smoker   . CKD (chronic kidney disease), stage II    Archie Endo 10/01/2017  . COPD (chronic obstructive pulmonary disease) ([place]) 10/21/2017   on CT scan chest  . History of echocardiogram    a. Echo 5/17 - EF 20-25%, severe diff HK, restrictive physiology, mild to mod MR, severe reduced RVSF, mod RVE, mild RAE, mod TR, PASP 48 mmHg  . Hx of cardiac cath    a. LHC 5/17 - normal coronary arteries. PA 45/25, mean 33, PCWP mean 18  . NICM (nonischemic cardiomyopathy) ([place])   . Stroke ([place]) 09/27/2017   "was weak on my left side; I'm fully recovered" (11/09/2017)  . Substance abuse ([facility])    cocaine, marijuana    Medications:  Scheduled:  . chlorhexidine  15 mL Mouth Rinse BID  . Chlorhexidine Gluconate Cloth  6 each Topical Q0600  . levalbuterol  0.63 mg Nebulization BID  . mouth rinse  15 mL Mouth Rinse q12n4p  . sodium chloride flush  10-40 mL Intracatheter Q12H  . sodium chloride flush  3 mL Intravenous Q12H   Infusions:  . sodium chloride    . furosemide (LASIX) infusion 12 mg/hr (06/20/19 1100)  . milrinone 0.375 mcg/kg/min (06/20/19 1100)  Assessment: 35 yoM with end-stage cardiomyopathy admitted with acute CHF and cardiogenic shock. Pt on warfarin PTA for hx of apical thrombus on ECHO 12/2018. Warfarin was held yesterday for INR 3.5. INR today is 2.3. Pharmacy has been consulted to resume warfarin.  *PTA dose = 5mg  daily except 2.5mg  Tues  Goal of Therapy:  INR 2-3 Monitor platelets by anticoagulation protocol: Yes   Plan:  -Continue Warfarin 5mg  daily except 2.5mg  Tues -Daily PT/INR.  , PharmD PGY2 Cardiology Pharmacy Resident Phone [phone] 06/20/2019       1:11 PM  Please check [email or web address] for unit-specific pharmacist phone numbers

## 2019-06-20 NOTE — Progress Notes (Signed)
Patient had 7 run of Ventricular tachycardia on 10/29 @ 2345, went in and assessed, patient appeared to be asymptomatic.  Will continue to monitor.  Elaina Hoops, RN

## 2019-06-20 NOTE — Progress Notes (Signed)
RT NOTE: RT arrived to bedside to give patient his breathing treatment. Patient was off the bipap. Patient sating 98% on RA, denies shortness of breath and says his breathing is comfortable. RT will leave patient off bipap for now. RN aware. RT will continue to monitor as needed.

## 2019-06-20 NOTE — Progress Notes (Signed)
Spoke to primary RN ;PICC exchange will will be done tom. 10/29 and RN will clarify order for exchange.

## 2019-06-20 NOTE — Discharge Summary (Addendum)
Advanced Heart Failure Team  Discharge Summary   Patient ID: Mark [name] MRN: [id number], DOB/AGE: 1975-12-21 43 y.o. Admit date: 06/19/2019 D/C date:     06/25/2019   Primary Discharge Diagnoses:  1. Cardiogenic Shock, Acute on chronic biventricular systolic HF 2. Hyperkalemia 3. Acute Respiratory Failure 4. AKI on CKD Stage III 5. Apical Thrombus  6. H/O MCA CVA 2019 7.  Full Code   Hospital Course:  Mark [name] is a 43 year old severe diffuse hypokinesis5/2020,NICM by cardiac cath 2017, history of right MCA CVA 2019, asthma, andpolysubstance use. Lastecho from 5/20 showed that EF is markedly low at 10% with LV apical thrombus. The RV is severely dysfunctional as well. RHC in 8/20 with low output and high filling pressures, he was on milrinone during 8/20 admission but insisted on going home forafuneral.   He was readmitted 9/16/20with recurrent cardiogenic shock.Co-ox was in the 30s. He was placed on milrinone and diuresed with IV lasix. Once diuresed he was transitioned to lasix 80 mg daily. Overall diuresed 9 pounds. Milrinone was turned off because he wanted to go home and get his affairs in order. He was referred to Palliative Care and understood he has limited time. He was not ready to transition to Hospice but was agreeable to outpatient Palliative Care.   Readmitted 06/09/19 with recurrent A/C biventricular heart failure/cardiogenic shock and possible PNA. PNA treated with antibiotic course. Placed on milrinone and diuresed with IV lasix. During his hospitalization he refused IV lasix 4 times. He wanted to pursue LVAD work up but was ultimately deemed to high risk for VAD at [facility] due to RV failure, poor PFTs, and social barriers. Discussed referral to [facility] for second opinion but he was adamant he wanted to go home today without milrinone. Milrinone was stopped and PICC was removed.Discharged 06/18/19.   On 06/19/19 he presented to [facility] via EMS with increased  shortness of breath.  Due to increased WOB he was placed Bipap. Potassium 7, creatinine 1.8, BNP 2045, and lactic acid 8. Hyperkalemia treated in the ED. He was able to tell me he wanted everything done and would want to go to [facility] if he they would consider VAD.  Placed on milrinone 0.375 mcg and diuresed with IV lasix with adequate response. Milrinone increased to 0.5 mcg due to low mixed venous saturation. Once diuresed he was transitioned to torsemide 60 mg daily.   Dr [name] personally called Dr [name] at [facility] and personally discussed his case. He was not thought to be candidate for LVAD at [facility] due to RV failure, poor PFTs, and social barriers. After discussion with Ashley he wanted to d/c home on milrinone. We discussed that milrinone will not make him live longer but will be for palliation. He was agreeable and verbalized understanding the risks of discharging home with PICC/milrinone.   Referred to [facility] for home milrinone. [facility] will follow for milrinone and [facility] will provide Hospice services. Discharged with single lumen PICC.    See below for detailed problems list. He will contine to be followed closely in the HF clinic.     1. Cardiogenic Shock, Biventricular HF CO-OX stable on milrinone 0.5 mcg mcg. - Diuresed with IV lasix and transitioned to torsemide 60 mg daily.  - No bb/arb with low output.   2. Hyperkalemia K 7. K corrected in the ED .  K stable at the time of discharge.      3. Acute Respiratory Failure On Bipap. Weaned to room air.  4. AKI on CKD Stage III Creatinine peaked at 1.8 on admit but improved with addition of milrinone.  - Creatinine on the day discharge was 0.98.  5. Apical Thrombus  On coumadin - INR check next week with weekly lab work   5. H/O MCA CVA 2019  6. Full Code- Will need ongoing goals of care with [facility].   Discharge Vitals: Blood pressure 103/72, pulse (!) 104, temperature 97.8 F (36.6 C), temperature  source Oral, resp. rate 20, height 5\' 9"  (1.753 m), weight 66.1 kg, SpO2 96 %.  Labs: Lab Results  Component Value Date   WBC 5.1 06/25/2019   HGB 9.6 (L) 06/25/2019   HCT 31.1 (L) 06/25/2019   MCV 82.9 06/25/2019   PLT 195 06/25/2019    Recent Labs  Lab 06/19/19 0931  06/25/19 1130  NA 136   < > 133*  K 4.8   < > 4.0  CL 99   < > 92*  CO2 25   < > 29  BUN 31*   < > 20  CREATININE 1.39*   < > 0.98  CALCIUM 10.0   < > 9.7  PROT 8.7*  --   --   BILITOT 2.4*  --   --   ALKPHOS 115  --   --   ALT 29  --   --   AST 52*  --   --   GLUCOSE 56*   < > 115*   < > = values in this interval not displayed.   Lab Results  Component Value Date   CHOL 118 04/12/2019   HDL 40 (L) 04/12/2019   LDLCALC 69 04/12/2019   TRIG 43 04/12/2019   BNP (last 3 results) Recent Labs    05/08/19 2018 06/09/19 1140 06/19/19 0516  BNP 3,017.6* 2,175.3* 2,045.3*    ProBNP (last 3 results) No results for input(s): PROBNP in the last 8760 hours.   Diagnostic Studies/Procedures   No results found.  Discharge Medications   Allergies as of 06/25/2019      Reactions   Bactrim [sulfamethoxazole-trimethoprim] Swelling, Rash   Facial swelling   Clindamycin/lincomycin Swelling, Palpitations   Hydrocodone Hives   Lasix [furosemide] Hives, Swelling   Facial swelling Reaction to name brand LASIX   Lisinopril Swelling, Other (See Comments)   Facial swelling/angioedema   Olopatadine Hcl Swelling   Makes his face swell up   Penicillins Hives, Swelling   Has patient had a PCN reaction causing immediate rash, facial/tongue/throat swelling, SOB or lightheadedness with hypotension: Yes Has patient had a PCN reaction causing severe rash involving mucus membranes or skin necrosis: No Has patient had a PCN reaction that required hospitalization: No Has patient had a PCN reaction occurring within the last 10 years: No If all of the above answers are "NO", then may proceed with Cephalosporin use.    Prednisone Shortness Of Breath, Nausea Only, Swelling, Other (See Comments)   Also made chest feel tight and genital area, legs, and face became swollen badly      Medication List    STOP taking these medications   aspirin 81 MG EC tablet   budesonide-formoterol 80-4.5 MCG/ACT inhaler Commonly known as: SYMBICORT   furosemide 80 MG tablet Commonly known as: LASIX   Muscle Rub 10-15 % Crea   OVER THE COUNTER MEDICATION   spironolactone 25 MG tablet Commonly known as: ALDACTONE     TAKE these medications   albuterol 108 (90 Base) MCG/ACT inhaler Commonly known  as: VENTOLIN HFA Inhale 1-2 puffs into the lungs every 4 (four) hours as needed for wheezing or shortness of breath.   digoxin 0.125 MG tablet Commonly known as: LANOXIN Take 1 tablet (0.125 mg total) by mouth daily.   milrinone 20 MG/100 ML Soln infusion Commonly known as: PRIMACOR Inject 0.0332 mg/min into the vein continuous. Per [facility]   multivitamin with minerals Tabs tablet Take 1 tablet by mouth daily.   potassium chloride SA 20 MEQ tablet Commonly known as: KLOR-CON Take 1 tablet (20 mEq total) by mouth daily.   torsemide 20 MG tablet Commonly known as: DEMADEX Take 3 tablets (60 mg total) by mouth daily.   warfarin 5 MG tablet Commonly known as: COUMADIN Take as directed. If you are unsure how to take this medication, talk to your nurse or doctor. Original instructions: 5 mg daily except 2.5 mg on Tuesday What changed:   how much to take  how to take this  when to take this  additional instructions            Durable Medical Equipment  (From admission, onward)         Start     Ordered   06/25/19 0825  Heart failure home health orders  (Heart failure home health orders / Face to face)  Once    Comments: Heart Failure Follow-up Care:  Verify follow-up appointments per Patient Discharge Instructions. Confirm transportation arranged. Reconcile home medications with discharge  medication list. Remove discontinued medications from use. Assist patient/caregiver to manage medications using pill box. Reinforce low sodium food selection Assessments: Vital signs and oxygen saturation at each visit. Assess home environment for safety concerns, caregiver support and availability of low-sodium foods. Consult Child psychotherapist, PT/OT, Dietitian, and CNA based on assessments. Perform comprehensive cardiopulmonary assessment. Notify MD for any change in condition or weight gain of 3 pounds in one day or 5 pounds in one week with symptoms. Daily Weights and Symptom Monitoring: Ensure patient has access to scales. Teach patient/caregiver to weigh daily before breakfast and after voiding using same scale and record.    Teach patient/caregiver to track weight and symptoms and when to notify Provider. Activity: Develop individualized activity plan with patient/caregiver.   Home Paraenteral Inotropic Therapy : Data Collection Form  Patients name: Mark [name]   Date: 06/20/19  Information below may not be completed by the supplier nor anyone in a Financial relationship with the supplier.  1. Results of invasive hemodynamic monitoring  Cardiac Index Before Inotrope infusion:          1.2             On Inotrope infusion:          1.8               Drug and dose:   Milrinone 0.5 mcg   2. Cardiac medications immediately prior to inotrope infusion (List name, dose, and frequency)   3. Dose this represent maximum tolerated doses of these medications? Yes.   4. Breathing status Prior to inotrope infusion: Dyspnea at rest  At time of discharge: Dyspnea on moderate exertion.   5. Initial home prescription Drug and Dose:   0.5 mcg for continuous infusion 24/hr day and 7 days/week  6. If continuous infusion is prescribed, have attempts to discontinue inotrope infusion in the hospital failed?   Yes.   7. If intermittent infusion is prescribed, have there been repeated  hospitalizations for heart failure which Parenteral inotrope  were required? Not applicable.   8. Is patient capable of going to the physician for outpatient evaluation? Yes.   9. Is routine electrocardiographic monitoring required in the Home?  No.   The above statements and any additional explanations included separately are true and accurate and there is documentation present in the patients medical record to support these statements.   Completed by Tonye BecketAmy Clegg, NP   In instances where this form was completed by an Advanced Practice Provider, please see EMR for physician Co-Signature.  Question Answer Comment  Heart Failure Follow-up Care Advanced Heart Failure (AHF) Clinic at [phone]   Obtain the following labs Basic Metabolic Panel   Lab frequency Weekly   Fax lab results to AHF Clinic at [phone]   Diet Low Sodium Heart Healthy   Fluid restrictions: 1800 mL Fluid      06/25/19 0824          Disposition   The patient will be discharged in stable condition to home. Discharge Instructions    (HEART FAILURE PATIENTS) Call MD:  Anytime you have any of the following symptoms: 1) 3 pound weight gain in 24 hours or 5 pounds in 1 week 2) shortness of breath, with or without a dry hacking cough 3) swelling in the hands, feet or stomach 4) if you have to sleep on extra pillows at night in order to breathe.   Complete by: As directed    Diet - low sodium heart healthy   Complete by: As directed    Heart Failure patients record your daily weight using the same scale at the same time of day   Complete by: As directed    Increase activity slowly   Complete by: As directed      Follow-up Information    [facility] Follow up on 07/02/2019.   Specialty: Cardiology Why: 11:30 Garage Code [id number]  Contact information: [address] [id number] [place] [id number] [phone]            Duration of Discharge  Encounter: Greater than 35 minutes   Signed, Tonye Becketmy Clegg NP-C  06/25/2019, 1:23 PM   Agree with above. Ok for d/c today on palliative milrinone with hospice support.   Arvilla Meresaniel Bensimhon, MD  12:39 PM

## 2019-06-21 ENCOUNTER — Inpatient Hospital Stay: Payer: Self-pay

## 2019-06-21 DIAGNOSIS — Z515 Encounter for palliative care: Secondary | ICD-10-CM

## 2019-06-21 DIAGNOSIS — R570 Cardiogenic shock: Secondary | ICD-10-CM | POA: Diagnosis not present

## 2019-06-21 DIAGNOSIS — N179 Acute kidney failure, unspecified: Secondary | ICD-10-CM

## 2019-06-21 DIAGNOSIS — Z7189 Other specified counseling: Secondary | ICD-10-CM

## 2019-06-21 DIAGNOSIS — I5042 Chronic combined systolic (congestive) and diastolic (congestive) heart failure: Secondary | ICD-10-CM

## 2019-06-21 LAB — FERRITIN: Ferritin: 53 ng/mL (ref 24–336)

## 2019-06-21 LAB — GLUCOSE, CAPILLARY
Glucose-Capillary: 143 mg/dL — ABNORMAL HIGH (ref 70–99)
Glucose-Capillary: 102 mg/dL — ABNORMAL HIGH (ref 70–99)
Glucose-Capillary: 175 mg/dL — ABNORMAL HIGH (ref 70–99)
Glucose-Capillary: 109 mg/dL — ABNORMAL HIGH (ref 70–99)

## 2019-06-21 LAB — CBC
HCT: 33.7 % — ABNORMAL LOW (ref 39.0–52.0)
Hemoglobin: 10.5 g/dL — ABNORMAL LOW (ref 13.0–17.0)
MCH: 25.8 pg — ABNORMAL LOW (ref 26.0–34.0)
MCHC: 31.2 g/dL (ref 30.0–36.0)
MCV: 82.8 fL (ref 80.0–100.0)
Platelets: 223 10*3/uL (ref 150–400)
RBC: 4.07 MIL/uL — ABNORMAL LOW (ref 4.22–5.81)
RDW: 16.9 % — ABNORMAL HIGH (ref 11.5–15.5)
WBC: 5.6 10*3/uL (ref 4.0–10.5)
nRBC: 0 % (ref 0.0–0.2)

## 2019-06-21 LAB — PROTIME-INR
INR: 1.6 — ABNORMAL HIGH (ref 0.8–1.2)
Prothrombin Time: 18.7 seconds — ABNORMAL HIGH (ref 11.4–15.2)

## 2019-06-21 LAB — COOXEMETRY PANEL
Carboxyhemoglobin: 1.3 % (ref 0.5–1.5)
Methemoglobin: 0.9 % (ref 0.0–1.5)
O2 Saturation: 50.6 %
Total hemoglobin: 10.8 g/dL — ABNORMAL LOW (ref 12.0–16.0)

## 2019-06-21 LAB — BASIC METABOLIC PANEL
Anion gap: 8 (ref 5–15)
BUN: 30 mg/dL — ABNORMAL HIGH (ref 6–20)
CO2: 31 mmol/L (ref 22–32)
Calcium: 9.3 mg/dL (ref 8.9–10.3)
Chloride: 94 mmol/L — ABNORMAL LOW (ref 98–111)
Creatinine, Ser: 1.13 mg/dL (ref 0.61–1.24)
GFR calc Af Amer: >60 mL/min (ref >60)
GFR calc non Af Amer: >60 mL/min (ref >60)
Glucose, Bld: 91 mg/dL (ref 70–99)
Potassium: 4.1 mmol/L (ref 3.5–5.1)
Sodium: 133 mmol/L — ABNORMAL LOW (ref 135–145)

## 2019-06-21 LAB — IRON AND TIBC
Iron: 41 ug/dL — ABNORMAL LOW (ref 45–182)
Saturation Ratios: 10 % — ABNORMAL LOW (ref 17.9–39.5)
TIBC: 420 ug/dL (ref 250–450)
UIBC: 379 ug/dL

## 2019-06-21 MED ORDER — FUROSEMIDE 80 MG PO TABS
80.0000 mg | ORAL_TABLET | Freq: Two times a day (BID) | ORAL | Status: DC
  Administered 2019-06-22 – 2019-06-23 (×2): 80 mg via ORAL
  Filled 2019-06-21 (×5): qty 1

## 2019-06-21 NOTE — TOC Initial Note (Signed)
Transition of Care [facility]) - Initial/Assessment Note    Patient Details  Name: Mark [name] MRN: [id number] Date of Birth: 16-Feb-1976  Transition of Care [facility]) CM/SW Contact:    Bethena Roys, RN Phone Number: 06/21/2019, 1:36 PM  Clinical Narrative: Patient transitioned from 2H-Continues on IV Milrinone. Plan in process for possible home milrinone. CM did reach out to Carolynn Sayers with [facility] regarding Milrinone. Pt has a co pay that we discussed and he has no money to pay for co pay. Patient states his wife is paying for everything and cannot ask her to pay for the co pay. CM did speak with Palliative NP- patient is active with [facility]- they will follow post hospitalization. Palliative NP stated patient is not ready to transition to hospice at this time. CM did call several [place] Agencies for assistance with Milrinone. [facility]-declined, [facility]-declined limited staff, [facility] unable to staff, [facility]-declined the patient, [facility] not able to service inotropes. Transition of care is at a stand-still at this time due to not having a  safe discharge plan, inability to find a [facility] agency that will service patient, and patient not being able to pay for co pay of Milrinone. CM will reach out to [facility]- [facility] to see if they have any suggestions regarding assistance with Milrinone.                 Expected Discharge Plan: [place] Barriers to Discharge: Continued Medical Work up   Patient Goals and CMS Choice Patient states their goals for this hospitalization and ongoing recovery are:: "to feel better" CMS Medicare.gov Compare Post Acute Care list provided to:: Patient Choice offered to / list presented to : Patient  Expected Discharge Plan and Services Expected Discharge Plan: [place] In-house Referral: NA Discharge Planning Services: CM Consult Post Acute Care Choice: [place]  arrangements for the past 2 months: [place]: RN(IV Milrinone gtt)     Prior Living Arrangements/Services Living arrangements for the past 2 months: Single Family Home Lives with:: Spouse Patient language and need for interpreter reviewed:: Yes Do you feel safe going back to the place where you live?: Yes      Need for Family Participation in Patient Care: Yes (Comment) Care giver support system in place?: Yes (comment)   Criminal Activity/Legal Involvement Pertinent to Current Situation/Hospitalization: No - Comment as needed   Permission Sought/Granted Permission sought to share information with : Family Supports, Chartered certified accountant granted to share information with : Yes, Verbal Permission Granted     Permission granted to share info w AGENCY: [place]- [facility]- declined, [facility]- declined, [facility] No inotrope services yet.      Emotional Assessment Appearance:: Appears stated age Attitude/Demeanor/Rapport: Engaged Affect (typically observed): Accepting Orientation: : Oriented to Self, Oriented to Place, Oriented to  Time, Oriented to Situation   Psych Involvement: No (comment)  Admission diagnosis:  Hyperkalemia [E87.5] Normocytic anemia [D64.9] Respiratory distress [R06.03] Hypoglycemia [E16.2] Acute kidney injury (nontraumatic) (HCC) [N17.9] Hyponasality [R49.22] Chronic combined systolic and diastolic congestive heart failure (HCC) [I50.42] Cardiogenic shock ([place]) [R57.0] Patient Active Problem List   Diagnosis Date Noted  . Right middle lobe pneumonia 06/09/2019  . Polysubstance abuse ([place]) 06/09/2019  . Chronic hyponatremia 06/09/2019  . Malnutrition of moderate degree  04/10/2019  . Cardiogenic shock (HCC) 04/09/2019  . Allergic reaction caused by a drug 02/14/2019  . Abnormal liver function 02/14/2019  . Encounter for therapeutic drug monitoring 02/13/2019  .  Encounter to establish care 02/13/2019  . 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 Acute CHF (congestive heart failure) (HCC) 01/24/2019  . Shortness of breath   . Cocaine use   . Acute on chronic systolic (congestive) heart failure (HCC) 12/25/2018  . Demand ischemia (HCC)   . LV (left ventricular) mural thrombus without MI (HCC)   . Noncompliance   . Chronic anemia   . Acute systolic CHF (congestive heart failure) (HCC) 11/09/2017  . History of right MCA stroke 09/28/2017  . Stroke (HCC) 09/28/2017  . NICM (nonischemic cardiomyopathy) (HCC) 01/11/2016  . CKD (chronic kidney disease) stage 2, GFR 60-89 ml/min 01/11/2016  . Cocaine abuse (HCC) 01/11/2016  . Chest pain, pleuritic 01/03/2016  . Tobacco abuse 01/03/2016  . Asthma 01/03/2016  . Leg swelling 01/03/2016  . Tachycardia 01/03/2016  . Normocytic anemia 01/03/2016  . Elevated troponin I level 01/03/2016  . Right rib fracture 01/03/2016  . Chest pain    PCP:  Grayce Sessions, NP Pharmacy:   [facility] - [place], [place] - [place] [address] [place] [address] Phone: [phone] Fax: [phone]  [facility] [place], [place] - [address] [place] [address] Phone: [phone] Fax: [phone]     Social Determinants of Health (SDOH) Interventions    Readmission Risk Interventions Readmission Risk Prevention Plan 04/12/2019 02/14/2019 01/25/2019  Transportation Screening Complete Complete Complete  PCP or Specialist Appt within 3-5 Days - Not Complete Complete  Not Complete comments - continue to assess prior d/c. -  HRI or Home Care Consult - Not Complete Complete  HRI or Home Care Consult comments - continue to assess -  Social Work Consult for Recovery Care Planning/Counseling - Not Complete Complete  SW consult not completed comments - continue to assess -  Palliative Care Screening - Not Applicable Complete   Medication Review (RN Care Manager) Complete Not Complete Complete  Med Review Comments - continue to assess till d/c -  PCP or Specialist appointment within 3-5 days of discharge Complete - -  HRI or Home Care Consult Complete - -  SW Recovery Care/Counseling Consult Complete - -  Palliative Care Screening Not Applicable - -  Skilled Nursing Facility Not Applicable - -  Some recent data might be hidden

## 2019-06-21 NOTE — Progress Notes (Signed)
Peripherally Inserted Central Catheter/Midline Placement  The IV Nurse has discussed with the patient and/or persons authorized to consent for the patient, the purpose of this procedure and the potential benefits and risks involved with this procedure.  The benefits include less needle sticks, lab draws from the catheter, and the patient may be discharged home with the catheter. Risks include, but not limited to, infection, bleeding, blood clot (thrombus formation), and puncture of an artery; nerve damage and irregular heartbeat and possibility to perform a PICC exchange if needed/ordered by physician.  Alternatives to this procedure were also discussed.  Bard Power PICC patient education guide, fact sheet on infection prevention and patient information card has been provided to patient /or left at bedside.  Over wire PICC exchange DL to [facility] for home therapies.  Patient gave original consent 06/19/19 0938.  PICC/Midline Placement Documentation  PICC Single Lumen 06/21/19 Brachial 41 cm 0 cm (Active)  Indication for Insertion or Continuance of Line Home intravenous therapies (PICC only) 06/21/19 1822  Exposed Catheter (cm) 0 cm 06/21/19 1822  Site Assessment Clean;Dry;Intact 06/21/19 1822  Line Status Flushed;Saline locked;Blood return noted 06/21/19 1822  Dressing Type Transparent 06/21/19 1822  Dressing Status Clean;Dry;Intact 06/21/19 1822  Dressing Intervention New dressing 06/21/19 1822  Dressing Change Due 06/28/19 06/21/19 1822       Kennan Detter, Nicolette Bang 06/21/2019, 6:23 PM

## 2019-06-21 NOTE — Progress Notes (Addendum)
Advanced Heart Failure Rounding Note  PCP-Cardiologist: Lauree Chandler, MD   Subjective:   Admitted with recurrent cardiogenic shock. Started back on milrinone 0.375 mcg and diuresed with IV lasix.   Remains on milrinone 0.375 mcg + lasix drip at 12 mg per hour. Co-ox 51% CVP 8-9  Feels better today. Wants to go home. Had brief NSVT overnight. Asymptomatic.  Pending PICC exchange today. No CP, SOB, orthopnea or PND.   [place] arranging home inotropes    Objective:   Weight Range: 62 kg Body mass index is 20.19 kg/m.   Vital Signs:   Temp:  [97.4 F (36.3 C)-97.8 F (36.6 C)] 97.4 F (36.3 C) (10/30 0743) Pulse Rate:  [46-112] 98 (10/30 0858) Resp:  [14-30] 18 (10/30 0743) BP: (100-126)/(64-95) 111/79 (10/30 0743) SpO2:  [97 %-100 %] 98 % (10/30 0743) Weight:  [62 kg] 62 kg (10/30 0624) Last BM Date: 06/19/19  Weight change: Filed Weights   06/19/19 0824 06/20/19 0329 06/21/19 0624  Weight: 67.8 kg 60.9 kg 62 kg    Intake/Output:   Intake/Output Summary (Last 24 hours) at 06/21/2019 1013 Last data filed at 06/21/2019 0800 Gross per 24 hour  Intake 1303.88 ml  Output 1775 ml  Net -471.12 ml      Physical Exam   General:  Lying in bed . No resp difficulty HEENT: normal Neck: supple. JVP 9. Carotids 2+ bilat; no bruits. No lymphadenopathy or thryomegaly appreciated. Cor: PMI laterally displaced. Regular rate & rhythm. 2/6 TR Lungs: clear Abdomen: soft, nontender, nondistended. No hepatosplenomegaly. No bruits or masses. Good bowel sounds. Extremities: no cyanosis, clubbing, rash, edema RUE PICC Neuro: alert & orientedx3, cranial nerves grossly intact. moves all 4 extremities w/o difficulty. Affect pleasant   Telemetry   Sinus tach 90s personally reviewed  EKG    n/a Labs    CBC Recent Labs    06/19/19 0516 06/19/19 0931 06/21/19 0500  WBC 8.4 11.2* 5.6  NEUTROABS 6.6  --   --   HGB 11.5* 10.7* 10.5*  HCT 39.0 33.6* 33.7*  MCV 85.3  80.6 82.8  PLT 295 248 627   Basic Metabolic Panel Recent Labs    06/19/19 0516  06/20/19 0305 06/21/19 0500  NA 131*   < > 133* 133*  K 7.0*   < > 4.2 4.1  CL 94*   < > 92* 94*  CO2 18*   < > 31 31  GLUCOSE 155*   < > 122* 91  BUN 33*   < > 32* 30*  CREATININE 1.85*   < > 1.36* 1.13  CALCIUM 10.2   < > 9.3 9.3  MG 2.3  --   --   --    < > = values in this interval not displayed.   Liver Function Tests Recent Labs    06/19/19 0931  AST 52*  ALT 29  ALKPHOS 115  BILITOT 2.4*  PROT 8.7*  ALBUMIN 3.6   No results for input(s): LIPASE, AMYLASE in the last 72 hours. Cardiac Enzymes No results for input(s): CKTOTAL, CKMB, CKMBINDEX, TROPONINI in the last 72 hours.  BNP: BNP (last 3 results) Recent Labs    05/08/19 2018 06/09/19 1140 06/19/19 0516  BNP 3,017.6* 2,175.3* 2,045.3*    ProBNP (last 3 results) No results for input(s): PROBNP in the last 8760 hours.   D-Dimer No results for input(s): DDIMER in the last 72 hours. Hemoglobin A1C No results for input(s): HGBA1C in the last 72 hours. Fasting  Lipid Panel No results for input(s): CHOL, HDL, LDLCALC, TRIG, CHOLHDL, LDLDIRECT in the last 72 hours. Thyroid Function Tests No results for input(s): TSH, T4TOTAL, T3FREE, THYROIDAB in the last 72 hours.  Invalid input(s): FREET3  Other results:   Imaging    No results found.   Medications:     Scheduled Medications: . chlorhexidine  15 mL Mouth Rinse BID  . Chlorhexidine Gluconate Cloth  6 each Topical Q0600  . furosemide  80 mg Oral Daily  . levalbuterol  0.63 mg Nebulization BID  . mouth rinse  15 mL Mouth Rinse q12n4p  . sodium chloride flush  10-40 mL Intracatheter Q12H  . sodium chloride flush  3 mL Intravenous Q12H  . [START ON 06/25/2019] warfarin  2.5 mg Oral Q Tue-1800  . warfarin  5 mg Oral Once per day on Sun Mon Wed Thu Fri Sat  . Warfarin - Pharmacist Dosing Inpatient   Does not apply q1800    Infusions: . sodium chloride    .  milrinone 0.375 mcg/kg/min (06/21/19 0726)    PRN Medications: sodium chloride, acetaminophen, ondansetron (ZOFRAN) IV, sodium chloride flush, sodium chloride flush    Assessment/Plan  1. Cardiogenic Shock due to a/c Biventricular systolic HF - ECHO LV 20-25% RV severely reduced.  -Lactic Acid on admit was 8 (24 hours after stopping milrinone) - CO-OX 51% on milrinone 0.375 mcg. - CVP down to 8-9.  - Increase po lasix to 80 po bid  2. Hyperkalemia - K 7. K corrected in the ED .  - K stable today.     3. Acute Respiratory Failure - Resolved. Off bipap  4. AKI on CKD Stage III - Creatinine improved with mirlinone 1.3>1.85>1.36> 1.13  5. Apical Thrombus  On coumadin INR 1.6 Discussed dosing with PharmD personally.  5. H/O MCA CVA 2019  6. Full Code  7. NSVT - off b-blocker with shock - Keep K > 4.0 Mg > 2.0  He remains very tenuous on IV milrinone. Co-ox still 51%. Renal function has normalized. Feels better. No CP or SOB.  I have discussed his case with Dr. [name] at [facility] and felt not to be VAD candidate due to RV failure, PFTs and compliance issues.  He has chosen to go home on home milrinone for palliative purposes.   PICC exchange planned for today but milrinone supplies will cost $35/day and medicaid won't cover so need to get this figure out and also find [facility] coverage for him prior to d/c. Spoke to [name] who said it may be Monday at earliest.   MCV low. Check iron stores.    Length of Stay: 2  Arvilla Meres, MD  06/21/2019, 10:13 AM  Advanced Heart Failure Team Pager [phone] (M-F; 7a - 4p)  Please contact [facility] for night-coverage after hours (4p -7a ) and weekends on [email or web address]

## 2019-06-21 NOTE — Progress Notes (Signed)
[facility])  Mr. Han[place]alt is currently followed by [facility] palliative services in the community.  [facility] will continue to follow during hospitalization.  Thank you, Venia Carbon RN, BSN, [facility] Liaison (in [place]) [phone] (main/24hr #)

## 2019-06-21 NOTE — Consult Note (Signed)
Consultation Note Date: 06/21/19  Patient Name: [name]  DOB: 16-Oct-1975  MRN: [id number]  Age / Sex: 43 y.o., male  PCP: Mark Perna, NP Referring Physician: Jolaine Artist, MD  Reason for Consultation: Establishing goals of care  HPI/Patient Profile: 43 y.o. male  with past medical history of NICM by cardiac cath 2017, right MCA CVA 2019, asthma, polysubstance abuse admitted on 06/19/2019 with shortness of breath. Multiple recurrent admissions for cardiogenic shock/acute on chronic biventricular heart failure. Heart failure team has been following. Deemed high risk for VAD at [name] due to RV failure, poor PFTs, and social barriers. Dr. Haroldine [name] has discussed case with Dr. Mosetta [name] at [name] and felt not to be a VAD candidate due to RV failure, PFTs and compliance issues. Receiving IV Milrinone and IV lasix for recurrent cardiogenic shock this admit. Off BiPAP. Plan is to go home on home milrinone. [name] medicine consultation for goals of care.   Clinical Assessment and Goals of Care:  I have reviewed medical records, discussed with care team, and met with patient at bedside to discuss diagnosis, prognosis, GOC, EOL wishes, disposition and options.  I introduced [name] as specialized medical care for people living with serious illness. It focuses on providing relief from the symptoms and stress of a serious illness. The goal is to improve quality of life for both the patient and the family.  Patient is well known to [name] and has been seen by multiple providers, last seen on 06/17/19. Notes reviewed.   [name] is sitting up on the side of the bed eating lunch. He denies pain or shortness of breath. He appears comfortable on room air. Vitals stable.  Discussed plan of care, including plan to discharge home with PICC and IV milrinone. Patient understands he  will likely need to be here through the weekend. He speaks of his frustrations with previous home health agencies promising to do things (like bring oxygen) but never follow through with this. Explained that he may have not qualified for oxygen--had NT check oxygen sats while at bedside and [name] was 100% on room air.   [name] is unsure if outpatient [name] providers have come to the house. He has a nebulizer machine but is requesting prescription for nebs. He shares that this greatly helps his breathing (also with history of asthma).   [name] is disheartened with the fact that he is not a candidate for VAD at [name]. He speaks of this being a 'lose/lose situation.'  Advanced directives, concepts specific to code status, artifical feeding and hydration, and rehospitalization were considered and discussed. [name] reports he has completed AD packet with his wife [name]) and sister Mark [name]) as documented HCPOA's. (Document not on file). [name] shares that his wife [name] understands his wishes and will make medical decisions for him.  [name] speaks of understanding that he is 'dying' and sick of talking about dying all the time. He has been 'told I'm dying for three years.' He wakes up in the morning  thinking about it and last thing he thinks of before he falls asleep. He makes appropriate comments about wanting to 'die peacefully' and what is the use of having aggressive measures (resuscitation/life support) if it's noting going to fix his heart---but furthermore, does not wish to make or document decision regarding DNR code status. He states that [name] and [place] knows his wishes and will make this decision when the time comes.  The difference between aggressive medical intervention and comfort care was considered. Introduced hospice philosophy and support hospice can provide in the home, especially has his disease continues to progress and nears the end-of-life. Mentioned this possibly being  week-months. [name] states 'I'm not that far gone yet.'  I attempted to elicit values and goals of care important to the patient. Jahson's plan is to discharge home with IV Milrinone and continue searching for a doctor who can 'save my life.' He plans to look more throughout [name], [place], and even [place]. He believes as long as 'I get this medicine' it will give him more time to search for more options.   Spiritual individual and shares that he has a good support system and multiple people thinking and praying for him.   Questions and concerns were addressed. PMT contact information given.     SUMMARY OF RECOMMENDATIONS    Patient seen by multiple [name] providers. Unfortunately, goals of care are unchanged today. Patient speaks of wanting to 'die peacefully' but not wanting to make decisions about code status and does not believe he needs hospice services yet. He shares that his wife [name]) knows and understands his wishes and will make these decisions when the time comes.   Patient plans to discharge home with IV PICC and home milrinone and continue searching through other health systems and find a doctor that can "save my life."   Continue home health services. Patient requesting home oxygen but may not qualify (100% on room air while this NP at bedside).  Patient requesting outpatient prescription for nebulizer's. He has a nebulizer machine.  Patient agreeable with outpatient [name] follow-up. [name] aware.   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   Code Status/Advance Care Planning:  Full code  Symptom Management:   Per attending  [name] Prophylaxis:   Bowel Regimen and Frequent Pain Assessment  Additional Recommendations (Limitations, Scope, Preferences):  Full Scope Treatment  Psycho-social/Spiritual:   Desire for further Chaplaincy support:yes  Additional Recommendations: Caregiving   Support/Resources, [facility] Education and Education on Hospice  Prognosis:   Poor long-term prognosis  Discharge Planning: Home with Home Health      Primary Diagnoses: Present on Admission:  Cardiogenic shock ([place])   I have reviewed the medical record, interviewed the patient and family, and examined the patient. The following aspects are pertinent.  Past Medical History:  Diagnosis Date   Asthma    Chronic systolic CHF (congestive heart failure) (HCC)    Cigarette smoker    CKD (chronic kidney disease), stage II    /notes 10/01/2017   COPD (chronic obstructive pulmonary disease) ([place]) 10/21/2017   on CT scan chest   History of echocardiogram    a. Echo 5/17 - EF 20-25%, severe diff HK, restrictive physiology, mild to mod MR, severe reduced RVSF, mod RVE, mild RAE, mod TR, PASP 48 mmHg   Hx of cardiac cath    a. LHC 5/17 - normal coronary arteries. PA 45/25, mean 33, PCWP mean 18   NICM (nonischemic cardiomyopathy) ([place])    Stroke (  [place]) 09/27/2017   "was weak on my left side; I'm fully recovered" (11/09/2017)   Substance abuse ([place])    cocaine, marijuana   Social History   Socioeconomic History   Marital status: Divorced    Spouse name: Not on file   Number of children: Not on file   Years of education: Not on file   Highest education level: Not on file  Occupational History   Not on file  Social Needs   Financial resource strain: Somewhat hard   Food insecurity    Worry: Sometimes true    Inability: Sometimes true   Transportation needs    Medical: No    Non-medical: No  Tobacco Use   Smoking status: Former Smoker    Packs/day: 1.00    Years: 30.00    Pack years: 30.00    Types: Cigarettes    Quit date: 09/27/2017    Years since quitting: 1.7   Smokeless tobacco: Never Used  Substance and Sexual Activity   Alcohol use: Yes    Alcohol/week: 5.0 standard drinks    Types: 5 Shots of liquor per week    Frequency: Never     Comment: 5-6 shots of vodka daily; "stopped it all after I had stroke 09/27/2017"   Drug use: Yes    Types: Cocaine, Marijuana    Comment: 11/09/2017 "none since 09/27/2017"   Sexual activity: Not Currently  Lifestyle   Physical activity    Days per week: 0 days    Minutes per session: 0 min   Stress: To some extent  Relationships   Social connections    Talks on phone: More than three times a week    Gets together: More than three times a week    Attends religious service: 1 to 4 times per year    Active member of club or organization: No    Attends meetings of clubs or organizations: Never    Relationship status: Divorced  Other Topics Concern   Not on file  Social History Narrative   Not on file   Family History  Problem Relation Age of Onset   Cardiomyopathy Father        Reports his father has an LVAD   Heart failure Father    Hypertension Father    Heart disease Maternal Grandmother        had a whole in her heart   Deep vein thrombosis Neg Hx    Scheduled Meds:  chlorhexidine  15 mL Mouth Rinse BID   Chlorhexidine Gluconate Cloth  6 each Topical Q0600   furosemide  80 mg Oral BID   levalbuterol  0.63 mg Nebulization BID   mouth rinse  15 mL Mouth Rinse q12n4p   sodium chloride flush  10-40 mL Intracatheter Q12H   sodium chloride flush  3 mL Intravenous Q12H   [START ON 06/25/2019] warfarin  2.5 mg Oral Q Tue-1800   warfarin  5 mg Oral Once per day on Sun Mon Wed Thu Fri Sat   Warfarin - Pharmacist Dosing Inpatient   Does not apply q1800   Continuous Infusions:  sodium chloride     milrinone 0.375 mcg/kg/min (06/21/19 0726)   PRN Meds:.sodium chloride, acetaminophen, ondansetron (ZOFRAN) IV, sodium chloride flush, sodium chloride flush Medications Prior to Admission:  Prior to Admission medications   Medication Sig Start Date End Date Taking? Authorizing Provider  albuterol (VENTOLIN HFA) 108 (90 Base) MCG/ACT inhaler Inhale 1-2 puffs into  the lungs every 4 (four) hours  as needed for wheezing or shortness of breath. 04/26/19   Charlott Rakes, MD  aspirin 81 MG EC tablet Take 1 tablet (81 mg total) by mouth daily. Patient taking differently: Take 162 mg by mouth daily.  12/30/18   Sheikh, Omair Latif, DO  budesonide-formoterol (SYMBICORT) 80-4.5 MCG/ACT inhaler Inhale 2 puffs into the lungs 2 (two) times daily. 04/13/19   Theora Gianotti, NP  digoxin (LANOXIN) 0.125 MG tablet Take 1 tablet (0.125 mg total) by mouth daily. 12/30/18   Raiford Noble Latif, DO  furosemide (LASIX) 80 MG tablet Take 1 tablet (80 mg total) by mouth every morning AND 0.5 tablets (40 mg total) every evening. Patient taking differently: Take 1 tablet (80 mg total) by mouth every morning AND 0.5 tablet (40 mg) in the evening as needed for swelling 05/20/19   Clegg, Amy D, NP  Menthol-Methyl Salicylate (MUSCLE RUB) 10-15 % CREA Apply 1 application topically daily as needed for muscle pain.    [provider]  Multiple Vitamin (MULTIVITAMIN WITH MINERALS) TABS tablet Take 1 tablet by mouth daily.    [provider]  OVER THE COUNTER MEDICATION Place 1 spray into both nostrils daily as needed (congestion). OTC  Nasal spray    [provider]  potassium chloride SA (K-DUR) 20 MEQ tablet Take 1 tablet (20 mEq total) by mouth daily. Take 62mq with Metolazone. Patient taking differently: Take 20 mEq by mouth daily.  03/08/19   Bensimhon, DShaune Pascal MD  spironolactone (ALDACTONE) 25 MG tablet Take 1 tablet (25 mg total) by mouth daily. 05/14/19   CDarrick GrinderD, NP  warfarin (COUMADIN) 5 MG tablet 5 mg daily except 2.5 mg on Tuesday Patient taking differently: Take 2.5-5 mg by mouth See admin instructions. Take 1/2 tablet (2.5 mg) by mouth on Tuesday evening at 6pm, take 1 tablet (5 mg) on all other evenings of the week - at 6pm 05/13/19   CDarrick GrinderD, NP   Allergies  Allergen Reactions   Clindamycin/Lincomycin Swelling and Palpitations    Hydrocodone Hives   Lisinopril Swelling and Other (See Comments)    Facial swelling/angioedema   Olopatadine Hcl Swelling    Makes his face swell up   Prednisone Shortness Of Breath, Nausea Only, Swelling and Other (See Comments)    Also made chest feel tight and genital area, legs, and face became swollen badly   Lasix [Furosemide] Hives and Swelling    Facial swelling Reaction to name brand LASIX   Penicillins Hives and Swelling     Has patient had a PCN reaction causing immediate rash, facial/tongue/throat swelling, SOB or lightheadedness with hypotension: Yes Has patient had a PCN reaction causing severe rash involving mucus membranes or skin necrosis: No Has patient had a PCN reaction that required hospitalization: No Has patient had a PCN reaction occurring within the last 10 years: No If all of the above answers are "NO", then may proceed with Cephalosporin use.    Bactrim [Sulfamethoxazole-Trimethoprim] Swelling and Rash    Facial swelling   Review of Systems  Constitutional: Positive for activity change.  Respiratory: Positive for shortness of breath.    Physical Exam Vitals signs and nursing note reviewed.  Constitutional:      General: He is awake.  Cardiovascular:     Rate and Rhythm: Normal rate.  Pulmonary:     Effort: No tachypnea, accessory muscle usage or respiratory distress.     Comments: Oxygen 100% on room air Skin:    General: Skin  is warm and dry.  Neurological:     Mental Status: He is alert and oriented to person, place, and time.  Psychiatric:        Attention and Perception: Attention normal.        Mood and Affect: Mood normal.        Speech: Speech normal.        Behavior: Behavior normal.        Cognition and Memory: Cognition normal.    Vital Signs: BP 111/79 (BP Location: Left Arm)    Pulse 98    Temp (!) 97.4 F (36.3 C) (Oral)    Resp 18    Ht _0  (1.753 m)    Wt 62 kg    SpO2 98%    BMI 20.19 kg/m  Pain Scale: 0-10   Pain  Score: 0-No pain   SpO2: SpO2: 98 % O2 Device:SpO2: 98 % O2 Flow Rate: .O2 Flow Rate (L/min): 4 L/min  IO: Intake/output summary:   Intake/Output Summary (Last 24 hours) at 06/21/2019 1709 Last data filed at 06/21/2019 0800 Gross per 24 hour  Intake 612.47 ml  Output 1025 ml  Net -412.53 ml    LBM: Last BM Date: 06/19/19 Baseline Weight: Weight: 67.8 kg Most recent weight: Weight: 62 kg     [name] Assessment/Data: PPS 50%   Flowsheet Rows     Most Recent Value  Intake Tab  Referral Department  Hospitalist  Unit at Time of Referral  Cardiac/Telemetry Unit  [name] Primary Diagnosis  Cardiac  [name] Care Type  Return patient [name]  Reason for referral  Clarify Goals of Care  Date first seen by [name]  06/21/19  Clinical Assessment  [name] Performance Scale Score  50%  Psychosocial & Spiritual Assessment  [name] Care Outcomes  Patient/Family meeting held?  Yes  Who was at the meeting?  patient  [name] Care Outcomes  Clarified goals of care, Counseled regarding hospice, Provided end of life care assistance, Provided psychosocial or spiritual support, Linked to [name] care logitudinal support, ACP counseling assistance      Time In: 1145 Time Out: 1255 Time Total: 70 Greater than 50%  of this time was spent counseling and coordinating care related to the above assessment and plan.  Signed by:  Ihor Dow, DNP, FNP-C [name] Team  Phone: [phone] Fax: [phone]   Please contact [name] Team phone at [phone] for questions and concerns.  For individual provider: See Shea Evans

## 2019-06-21 NOTE — Progress Notes (Signed)
ANTICOAGULATION CONSULT NOTE - Follow Up Consult  Pharmacy Consult for warfarin  Indication: apical thrombus  Allergies  Allergen Reactions  . Clindamycin/Lincomycin Swelling and Palpitations  . Hydrocodone Hives  . Lisinopril Swelling and Other (See Comments)    Facial swelling/angioedema  . Olopatadine Hcl Swelling    Makes his face swell up  . Prednisone Shortness Of Breath, Nausea Only, Swelling and Other (See Comments)    Also made chest feel tight and genital area, legs, and face became swollen badly  . Lasix [Furosemide] Hives and Swelling    Facial swelling Reaction to name brand LASIX  . Penicillins Hives and Swelling     Has patient had a PCN reaction causing immediate rash, facial/tongue/throat swelling, SOB or lightheadedness with hypotension: Yes Has patient had a PCN reaction causing severe rash involving mucus membranes or skin necrosis: No Has patient had a PCN reaction that required hospitalization: No Has patient had a PCN reaction occurring within the last 10 years: No If all of the above answers are "NO", then may proceed with Cephalosporin use.   . Bactrim [Sulfamethoxazole-Trimethoprim] Swelling and Rash    Facial swelling    Patient Measurements: Height: 5\' 9"  (175.3 cm) Weight: 136 lb 11.2 oz (62 kg) IBW/kg (Calculated) : 70.7  Vital Signs: Temp: 97.4 F (36.3 C) (10/30 0743) Temp Source: Oral (10/30 0743) BP: 111/79 (10/30 0743) Pulse Rate: 98 (10/30 0858)  Labs: Recent Labs    06/19/19 0516 06/19/19 0931 06/20/19 0305 06/20/19 0830 06/21/19 0500  HGB 11.5* 10.7*  --   --  10.5*  HCT 39.0 33.6*  --   --  33.7*  PLT 295 248  --   --  223  LABPROT 34.5*  --   --  25.0* 18.7*  INR 3.5*  --   --  2.3* 1.6*  CREATININE 1.85* 1.39* 1.36*  --  1.13  TROPONINIHS 35* 31*  --   --   --     Estimated Creatinine Clearance: 72.4 mL/min (by C-G formula based on SCr of 1.13 mg/dL).   Medical History: Past Medical History:  Diagnosis Date  .  Asthma   . Chronic systolic CHF (congestive heart failure) ([place])   . Cigarette smoker   . CKD (chronic kidney disease), stage II    Archie Endo 10/01/2017  . COPD (chronic obstructive pulmonary disease) ([place]) 10/21/2017   on CT scan chest  . History of echocardiogram    a. Echo 5/17 - EF 20-25%, severe diff HK, restrictive physiology, mild to mod MR, severe reduced RVSF, mod RVE, mild RAE, mod TR, PASP 48 mmHg  . Hx of cardiac cath    a. LHC 5/17 - normal coronary arteries. PA 45/25, mean 33, PCWP mean 18  . NICM (nonischemic cardiomyopathy) ([place])   . Stroke ([place]) 09/27/2017   "was weak on my left side; I'm fully recovered" (11/09/2017)  . Substance abuse ([facility])    cocaine, marijuana    Medications:  Scheduled:  . chlorhexidine  15 mL Mouth Rinse BID  . Chlorhexidine Gluconate Cloth  6 each Topical Q0600  . furosemide  80 mg Oral Daily  . levalbuterol  0.63 mg Nebulization BID  . mouth rinse  15 mL Mouth Rinse q12n4p  . sodium chloride flush  10-40 mL Intracatheter Q12H  . sodium chloride flush  3 mL Intravenous Q12H  . [START ON 06/25/2019] warfarin  2.5 mg Oral Q Tue-1800  . warfarin  5 mg Oral Once per day on Sun Mon Wed  Thu Fri Sat  . Warfarin - Pharmacist Dosing Inpatient   Does not apply q1800   Infusions:  . sodium chloride    . milrinone 0.375 mcg/kg/min (06/21/19 0726)    Assessment: 20 yoM with end-stage cardiomyopathy admitted with acute CHF and cardiogenic shock. Pt on warfarin PTA for hx of apical thrombus on ECHO 12/2018. Warfarin was held on 10/28 for INR 3.5. INR today is below goal at 1.6 following held doses. Pharmacy has been consulted to resume warfarin. Possible discharge today.  *PTA dose = 5mg  daily except 2.5mg  Tues  Goal of Therapy:  INR 2-3 Monitor platelets by anticoagulation protocol: Yes   Plan:  -Continue Warfarin 5mg  daily except 2.5mg  Tues -Would continue this dosing at discharge -Daily PT/INR.  , PharmD PGY2 Cardiology Pharmacy  Resident Phone [phone] 06/21/2019       9:58 AM  Please check [email or web address] for unit-specific pharmacist phone numbers

## 2019-06-22 ENCOUNTER — Other Ambulatory Visit: Payer: Self-pay

## 2019-06-22 ENCOUNTER — Encounter (HOSPITAL_COMMUNITY): Payer: Self-pay

## 2019-06-22 DIAGNOSIS — R570 Cardiogenic shock: Secondary | ICD-10-CM | POA: Diagnosis not present

## 2019-06-22 LAB — CBC
HCT: 32.2 % — ABNORMAL LOW (ref 39.0–52.0)
Hemoglobin: 10 g/dL — ABNORMAL LOW (ref 13.0–17.0)
MCH: 25.6 pg — ABNORMAL LOW (ref 26.0–34.0)
MCHC: 31.1 g/dL (ref 30.0–36.0)
MCV: 82.6 fL (ref 80.0–100.0)
Platelets: 257 10*3/uL (ref 150–400)
RBC: 3.9 MIL/uL — ABNORMAL LOW (ref 4.22–5.81)
RDW: 17.1 % — ABNORMAL HIGH (ref 11.5–15.5)
WBC: 5.5 10*3/uL (ref 4.0–10.5)
nRBC: 0 % (ref 0.0–0.2)

## 2019-06-22 LAB — COOXEMETRY PANEL
Carboxyhemoglobin: 1.1 % (ref 0.5–1.5)
Methemoglobin: 1.1 % (ref 0.0–1.5)
O2 Saturation: 39.5 %
Total hemoglobin: 10.3 g/dL — ABNORMAL LOW (ref 12.0–16.0)

## 2019-06-22 LAB — BASIC METABOLIC PANEL
Anion gap: 9 (ref 5–15)
BUN: 28 mg/dL — ABNORMAL HIGH (ref 6–20)
CO2: 28 mmol/L (ref 22–32)
Calcium: 9.3 mg/dL (ref 8.9–10.3)
Chloride: 97 mmol/L — ABNORMAL LOW (ref 98–111)
Creatinine, Ser: 1.04 mg/dL (ref 0.61–1.24)
GFR calc Af Amer: >60 mL/min (ref >60)
GFR calc non Af Amer: >60 mL/min (ref >60)
Glucose, Bld: 89 mg/dL (ref 70–99)
Potassium: 4.2 mmol/L (ref 3.5–5.1)
Sodium: 134 mmol/L — ABNORMAL LOW (ref 135–145)

## 2019-06-22 LAB — GLUCOSE, CAPILLARY
Glucose-Capillary: 158 mg/dL — ABNORMAL HIGH (ref 70–99)
Glucose-Capillary: 127 mg/dL — ABNORMAL HIGH (ref 70–99)

## 2019-06-22 LAB — PROTIME-INR
INR: 1.5 — ABNORMAL HIGH (ref 0.8–1.2)
Prothrombin Time: 18.3 seconds — ABNORMAL HIGH (ref 11.4–15.2)

## 2019-06-22 MED ORDER — LEVALBUTEROL HCL 0.63 MG/3ML IN NEBU
0.6300 mg | INHALATION_SOLUTION | Freq: Three times a day (TID) | RESPIRATORY_TRACT | Status: DC
  Administered 2019-06-23 – 2019-06-25 (×7): 0.63 mg via RESPIRATORY_TRACT
  Filled 2019-06-22 (×8): qty 3

## 2019-06-22 MED ORDER — SODIUM CHLORIDE 0.9 % IV SOLN
510.0000 mg | Freq: Once | INTRAVENOUS | Status: AC
  Administered 2019-06-22: 510 mg via INTRAVENOUS
  Filled 2019-06-22: qty 17

## 2019-06-22 MED ORDER — LEVALBUTEROL HCL 0.63 MG/3ML IN NEBU
0.6300 mg | INHALATION_SOLUTION | Freq: Four times a day (QID) | RESPIRATORY_TRACT | Status: DC | PRN
  Administered 2019-06-22 – 2019-06-25 (×3): 0.63 mg via RESPIRATORY_TRACT
  Filled 2019-06-22 (×4): qty 3

## 2019-06-22 NOTE — Progress Notes (Signed)
ANTICOAGULATION CONSULT NOTE - Follow Up Consult  Pharmacy Consult for warfarin  Indication: apical thrombus  Allergies  Allergen Reactions  . Clindamycin/Lincomycin Swelling and Palpitations  . Hydrocodone Hives  . Lisinopril Swelling and Other (See Comments)    Facial swelling/angioedema  . Olopatadine Hcl Swelling    Makes his face swell up  . Prednisone Shortness Of Breath, Nausea Only, Swelling and Other (See Comments)    Also made chest feel tight and genital area, legs, and face became swollen badly  . Lasix [Furosemide] Hives and Swelling    Facial swelling Reaction to name brand LASIX  . Penicillins Hives and Swelling     Has patient had a PCN reaction causing immediate rash, facial/tongue/throat swelling, SOB or lightheadedness with hypotension: Yes Has patient had a PCN reaction causing severe rash involving mucus membranes or skin necrosis: No Has patient had a PCN reaction that required hospitalization: No Has patient had a PCN reaction occurring within the last 10 years: No If all of the above answers are "NO", then may proceed with Cephalosporin use.   . Bactrim [Sulfamethoxazole-Trimethoprim] Swelling and Rash    Facial swelling    Patient Measurements: Height: 5\' 9"  (175.3 cm) Weight: 140 lb 12.8 oz (63.9 kg) IBW/kg (Calculated) : 70.7  Vital Signs: Temp: 97.7 F (36.5 C) (10/31 0803) Temp Source: Oral (10/31 0803) BP: 107/70 (10/31 0803) Pulse Rate: 75 (10/31 0803)  Labs: Recent Labs    06/19/19 0931 06/20/19 0305 06/20/19 0830 06/21/19 0500 06/22/19 0440  HGB 10.7*  --   --  10.5* 10.0*  HCT 33.6*  --   --  33.7* 32.2*  PLT 248  --   --  223 257  LABPROT  --   --  25.0* 18.7* 18.3*  INR  --   --  2.3* 1.6* 1.5*  CREATININE 1.39* 1.36*  --  1.13 1.04  TROPONINIHS 31*  --   --   --   --     Estimated Creatinine Clearance: 81.1 mL/min (by C-G formula based on SCr of 1.04 mg/dL).   Medical History: Past Medical History:  Diagnosis Date   . Asthma   . Chronic systolic CHF (congestive heart failure) ([place])   . Cigarette smoker   . CKD (chronic kidney disease), stage II    Archie Endo 10/01/2017  . COPD (chronic obstructive pulmonary disease) ([place]) 10/21/2017   on CT scan chest  . History of echocardiogram    a. Echo 5/17 - EF 20-25%, severe diff HK, restrictive physiology, mild to mod MR, severe reduced RVSF, mod RVE, mild RAE, mod TR, PASP 48 mmHg  . Hx of cardiac cath    a. LHC 5/17 - normal coronary arteries. PA 45/25, mean 33, PCWP mean 18  . NICM (nonischemic cardiomyopathy) ([place])   . Stroke ([place]) 09/27/2017   "was weak on my left side; I'm fully recovered" (11/09/2017)  . Substance abuse ([facility])    cocaine, marijuana    Medications:  Scheduled:  . chlorhexidine  15 mL Mouth Rinse BID  . Chlorhexidine Gluconate Cloth  6 each Topical Q0600  . furosemide  80 mg Oral BID  . levalbuterol  0.63 mg Nebulization BID  . mouth rinse  15 mL Mouth Rinse q12n4p  . sodium chloride flush  10-40 mL Intracatheter Q12H  . sodium chloride flush  3 mL Intravenous Q12H  . [START ON 06/25/2019] warfarin  2.5 mg Oral Q Tue-1800  . warfarin  5 mg Oral Once per day on Sun  [place] Flow Wed Thu Fri Sat  . Warfarin - Pharmacist Dosing Inpatient   Does not apply q1800   Infusions:  . sodium chloride    . ferumoxytol    . milrinone 0.5 mcg/kg/min (06/22/19 0800)    Assessment: 43 yoM with end-stage cardiomyopathy admitted with acute CHF and cardiogenic shock. Pt on warfarin PTA for hx of apical thrombus on ECHO 12/2018. Warfarin was held on 10/28 for INR 3.5. INR today is below goal at 1.5 following held doses. Pharmacy has been consulted to resume warfarin. Possible discharge soon.   *PTA dose = 5mg  daily except 2.5mg  Tues  Goal of Therapy:  INR 2-3 Monitor platelets by anticoagulation protocol: Yes   Plan:  - Continue Warfarin 5mg  daily  - Would continue same dosing at discharge that patient came in on. - Daily PT/INR.  ,  PharmD PGY1 Acute Care Pharmacy Resident 06/22/2019       9:18 AM  Please check [email or web address] for unit-specific pharmacist phone numbers

## 2019-06-22 NOTE — Progress Notes (Signed)
Patient ID: Mark [name], male   DOB: 03-06-1976, 43 y.o.   MRN: [id number]     Advanced Heart Failure Rounding Note  PCP-Cardiologist: Lauree Chandler, MD   Subjective:    Admitted with recurrent cardiogenic shock. Started back on milrinone 0.375 mcg and diuresed with IV lasix.   Remains on milrinone 0.375 mcg, now on po Lasix. CVP 7 today but co-ox still low at 39.5%.  Feels "so-so."  Was able to walk around room.   [place]an[place] arranging home inotropes    Objective:   Weight Range: 63.9 kg Body mass index is 20.79 kg/m.   Vital Signs:   Temp:  [97.4 F (36.3 C)-98.2 F (36.8 C)] 98.2 F (36.8 C) (10/31 0427) Pulse Rate:  [98-113] 113 (10/31 0427) Resp:  [18-26] 18 (10/31 0427) BP: (104-113)/(79-92) 109/81 (10/31 0427) SpO2:  [98 %-100 %] 99 % (10/31 0427) Weight:  [63.9 kg] 63.9 kg (10/31 0427) Last BM Date: 06/19/19  Weight change: Filed Weights   06/20/19 0329 06/21/19 0624 06/22/19 0427  Weight: 60.9 kg 62 kg 63.9 kg    Intake/Output:   Intake/Output Summary (Last 24 hours) at 06/22/2019 0742 Last data filed at 06/22/2019 0423 Gross per 24 hour  Intake 1280 ml  Output 900 ml  Net 380 ml      Physical Exam   General: NAD Neck: JVP 8-9 cm, no thyromegaly or thyroid nodule.  Lungs: Clear to auscultation bilaterally with normal respiratory effort. CV: Lateral PMI.  Heart regular S1/S2, no S3/S4, no murmur.  No peripheral edema.   Abdomen: Soft, nontender, no hepatosplenomegaly, no distention.  Skin: Intact without lesions or rashes.  Neurologic: Alert and oriented x 3.  Psych: Normal affect. Extremities: No clubbing or cyanosis.  HEENT: Normal.    Telemetry   NSR 90s personally reviewed  EKG    n/a Labs    CBC Recent Labs    06/21/19 0500 06/22/19 0440  WBC 5.6 5.5  HGB 10.5* 10.0*  HCT 33.7* 32.2*  MCV 82.8 82.6  PLT 223 621   Basic Metabolic Panel Recent Labs    06/21/19 0500 06/22/19 0440  NA 133* 134*  K 4.1 4.2  CL 94*  97*  CO2 31 28  GLUCOSE 91 89  BUN 30* 28*  CREATININE 1.13 1.04  CALCIUM 9.3 9.3   Liver Function Tests Recent Labs    06/19/19 0931  AST 52*  ALT 29  ALKPHOS 115  BILITOT 2.4*  PROT 8.7*  ALBUMIN 3.6   No results for input(s): LIPASE, AMYLASE in the last 72 hours. Cardiac Enzymes No results for input(s): CKTOTAL, CKMB, CKMBINDEX, TROPONINI in the last 72 hours.  BNP: BNP (last 3 results) Recent Labs    05/08/19 2018 06/09/19 1140 06/19/19 0516  BNP 3,017.6* 2,175.3* 2,045.3*    ProBNP (last 3 results) No results for input(s): PROBNP in the last 8760 hours.   D-Dimer No results for input(s): DDIMER in the last 72 hours. Hemoglobin A1C No results for input(s): HGBA1C in the last 72 hours. Fasting Lipid Panel No results for input(s): CHOL, HDL, LDLCALC, TRIG, CHOLHDL, LDLDIRECT in the last 72 hours. Thyroid Function Tests No results for input(s): TSH, T4TOTAL, T3FREE, THYROIDAB in the last 72 hours.  Invalid input(s): FREET3  Other results:   Imaging    [place] Ekg Site Rite  Result Date: 06/21/2019 If Site Rite image not attached, placement could not be confirmed due to current cardiac rhythm.    Medications:     Scheduled  Medications: . chlorhexidine  15 mL Mouth Rinse BID  . Chlorhexidine Gluconate Cloth  6 each Topical Q0600  . furosemide  80 mg Oral BID  . levalbuterol  0.63 mg Nebulization BID  . mouth rinse  15 mL Mouth Rinse q12n4p  . sodium chloride flush  10-40 mL Intracatheter Q12H  . sodium chloride flush  3 mL Intravenous Q12H  . [START ON 06/25/2019] warfarin  2.5 mg Oral Q Tue-1800  . warfarin  5 mg Oral Once per day on Sun Mon Wed Thu Fri Sat  . Warfarin - Pharmacist Dosing Inpatient   Does not apply q1800    Infusions: . sodium chloride    . milrinone 0.375 mcg/kg/min (06/21/19 1929)    PRN Medications: sodium chloride, acetaminophen, ondansetron (ZOFRAN) IV, sodium chloride flush, sodium chloride flush    Assessment/Plan    1. Cardiogenic Shock due to a/c Biventricular systolic HF - ECHO LV 20-25% RV severely reduced.  - Lactic Acid on admit was 8 (24 hours after stopping milrinone) - CO-OX still low at 39.5% on milrinone 0.375 mcg, will increase milrinone to 0.5 mcg/kg/min. Will go home with palliative milrinone, not a candidate for advanced therapies.  - CVP down to 7.  - Continue Lasix 80 po bid  2. Hyperkalemia - K 7. K corrected in the ED .  - K stable today.     3. Acute Respiratory Failure - Resolved. Off bipap  4. AKI on CKD Stage III - Creatinine improved with mirlinone 1.3>1.85>1.36> 1.13 > 1.04.   5. Apical Thrombus  - On coumadin INR 1.5, continue warfarin.   5. H/O MCA CVA 2019  6. Full Code  7. NSVT - off b-blocker with shock - Keep K > 4.0 Mg > 2.0  8. Fe deficiency.  - Will give IV Fe.   He remains very tenuous on IV milrinone. Co-ox still 39.5%. Renal function has normalized. Feels better overall.  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 [name] at [facility] and felt not to be VAD candidate due to RV failure, PFTs and compliance issues.  He has chosen to go home on home milrinone for palliative purposes.   Milrinone supplies will cost $35/day and Medicaid won't cover so need to get this figured out and also find [facility] coverage for him prior to d/c. We have spoken to [name] who said it may be Monday at earliest.    Length of Stay: 3  [name], MD  06/22/2019, 7:42 AM  Advanced Heart Failure Team Pager [phone] (M-F; 7a - 4p)  Please contact [facility] for night-coverage after hours (4p -7a ) and weekends on [email or web address]

## 2019-06-22 NOTE — Progress Notes (Signed)
Patient refused to have blood sugar taken prior to eating lunch. Will attempt to get 1700 blood sugar prior to evening meal.

## 2019-06-23 LAB — BASIC METABOLIC PANEL
Anion gap: 7 (ref 5–15)
BUN: 31 mg/dL — ABNORMAL HIGH (ref 6–20)
CO2: 28 mmol/L (ref 22–32)
Calcium: 9.1 mg/dL (ref 8.9–10.3)
Chloride: 100 mmol/L (ref 98–111)
Creatinine, Ser: 1.01 mg/dL (ref 0.61–1.24)
GFR calc Af Amer: >60 mL/min (ref >60)
GFR calc non Af Amer: >60 mL/min (ref >60)
Glucose, Bld: 94 mg/dL (ref 70–99)
Potassium: 4.4 mmol/L (ref 3.5–5.1)
Sodium: 135 mmol/L (ref 135–145)

## 2019-06-23 LAB — CBC
HCT: 30.7 % — ABNORMAL LOW (ref 39.0–52.0)
Hemoglobin: 9.5 g/dL — ABNORMAL LOW (ref 13.0–17.0)
MCH: 25.5 pg — ABNORMAL LOW (ref 26.0–34.0)
MCHC: 30.9 g/dL (ref 30.0–36.0)
MCV: 82.5 fL (ref 80.0–100.0)
Platelets: 197 10*3/uL (ref 150–400)
RBC: 3.72 MIL/uL — ABNORMAL LOW (ref 4.22–5.81)
RDW: 17.2 % — ABNORMAL HIGH (ref 11.5–15.5)
WBC: 4.5 10*3/uL (ref 4.0–10.5)
nRBC: 0 % (ref 0.0–0.2)

## 2019-06-23 LAB — COOXEMETRY PANEL
Carboxyhemoglobin: 1.4 % (ref 0.5–1.5)
Methemoglobin: 1.5 % (ref 0.0–1.5)
O2 Saturation: 54.7 %
Total hemoglobin: 12.4 g/dL (ref 12.0–16.0)

## 2019-06-23 LAB — PROTIME-INR
INR: 1.7 — ABNORMAL HIGH (ref 0.8–1.2)
Prothrombin Time: 19.7 seconds — ABNORMAL HIGH (ref 11.4–15.2)

## 2019-06-23 NOTE — Progress Notes (Signed)
Patient ID: Mark [name], male   DOB: March 29, 1976, 43 y.o.   MRN: [id number]     Advanced Heart Failure Rounding Note  PCP-Cardiologist: Mark Chandler, MD   Subjective:    Admitted with recurrent cardiogenic shock. Started back on milrinone 0.375 mcg and diuresed with IV lasix.  Milrinone increased to 0.5 mcg/kg/min. Co-ox 55% this morning, improved.  CVP 8-9.   Not very interactive today but seems ok. Refused feraheme yesterday.   Mark [name] arranging home inotropes    Objective:   Weight Range: 65 kg Body mass index is 21.15 kg/m.   Vital Signs:   Temp:  [97.6 F (36.4 C)-98.3 F (36.8 C)] 98.1 F (36.7 C) (11/01 0630) Pulse Rate:  [98-154] 154 (11/01 0630) Resp:  [18-23] 18 (11/01 0630) BP: (104-115)/(74-81) 115/81 (11/01 0630) SpO2:  [97 %-99 %] 98 % (11/01 0630) Weight:  [65 kg] 65 kg (11/01 0630) Last BM Date: 06/21/19  Weight change: Filed Weights   06/21/19 0624 06/22/19 0427 06/23/19 0630  Weight: 62 kg 63.9 kg 65 kg    Intake/Output:   Intake/Output Summary (Last 24 hours) at 06/23/2019 0944 Last data filed at 06/23/2019 0800 Gross per 24 hour  Intake 839.39 ml  Output 555 ml  Net 284.39 ml      Physical Exam   General: NAD Neck: No JVD, no thyromegaly or thyroid nodule.  Lungs: Clear to auscultation bilaterally with normal respiratory effort. CV: Lateral PMI.  Heart regular S1/S2, no S3/S4, no murmur.  No peripheral edema.   Abdomen: Soft, nontender, no hepatosplenomegaly, no distention.  Skin: Intact without lesions or rashes.  Neurologic: Alert and oriented x 3.  Psych: Normal affect. Extremities: No clubbing or cyanosis.  HEENT: Normal.    Telemetry   NSR 90s personally reviewed  EKG    n/a Labs    CBC Recent Labs    06/22/19 0440 06/23/19 0511  WBC 5.5 4.5  HGB 10.0* 9.5*  HCT 32.2* 30.7*  MCV 82.6 82.5  PLT 257 341   Basic Metabolic Panel Recent Labs    06/22/19 0440 06/23/19 0511  NA 134* 135  K 4.2 4.4  CL 97*  100  CO2 28 28  GLUCOSE 89 94  BUN 28* 31*  CREATININE 1.04 1.01  CALCIUM 9.3 9.1   Liver Function Tests No results for input(s): AST, ALT, ALKPHOS, BILITOT, PROT, ALBUMIN in the last 72 hours. No results for input(s): LIPASE, AMYLASE in the last 72 hours. Cardiac Enzymes No results for input(s): CKTOTAL, CKMB, CKMBINDEX, TROPONINI in the last 72 hours.  BNP: BNP (last 3 results) Recent Labs    05/08/19 2018 06/09/19 1140 06/19/19 0516  BNP 3,017.6* 2,175.3* 2,045.3*    ProBNP (last 3 results) No results for input(s): PROBNP in the last 8760 hours.   D-Dimer No results for input(s): DDIMER in the last 72 hours. Hemoglobin A1C No results for input(s): HGBA1C in the last 72 hours. Fasting Lipid Panel No results for input(s): CHOL, HDL, LDLCALC, TRIG, CHOLHDL, LDLDIRECT in the last 72 hours. Thyroid Function Tests No results for input(s): TSH, T4TOTAL, T3FREE, THYROIDAB in the last 72 hours.  Invalid input(s): FREET3  Other results:   Imaging    No results found.   Medications:     Scheduled Medications: . chlorhexidine  15 mL Mouth Rinse BID  . Chlorhexidine Gluconate Cloth  6 each Topical Q0600  . furosemide  80 mg Oral BID  . levalbuterol  0.63 mg Nebulization TID  . mouth rinse  15 mL Mouth Rinse q12n4p  . sodium chloride flush  10-40 mL Intracatheter Q12H  . [START ON 06/25/2019] warfarin  2.5 mg Oral Q Tue-1800  . warfarin  5 mg Oral Once per day on Sun Mon Wed Thu Fri Sat  . Warfarin - Pharmacist Dosing Inpatient   Does not apply q1800    Infusions: . milrinone 0.5 mcg/kg/min (06/23/19 0631)    PRN Medications: acetaminophen, levalbuterol, ondansetron (ZOFRAN) IV, sodium chloride flush    Assessment/Plan   1. Cardiogenic Shock due to a/c Biventricular systolic HF - ECHO LV 20-25% RV severely reduced.  - Lactic Acid on admit was 8 (24 hours after stopping milrinone) - CO-OX better at 55% on milrinone 0.5 mcg/kg/min. Will go home with  palliative milrinone, not a candidate for advanced therapies.  - CVP 8-9 today.   - Continue Lasix 80 po bid  2. Hyperkalemia - K 7. K corrected in the ED .  - K stable today.     3. Acute Respiratory Failure - Resolved. Off bipap  4. AKI on CKD Stage III - Creatinine improved with mirlinone 1.3>1.85>1.36> 1.13 > 1.04 > 1.01.   5. Apical Thrombus  - On coumadin INR 1.7, continue warfarin.   5. H/O MCA CVA 2019  6. Full Code  7. NSVT - off b-blocker with shock - Keep K > 4.0 Mg > 2.0  8. Fe deficiency.  - He did not want IV Fe.    He remains very tenuous on IV milrinone. Co-ox still 39.5%. Renal function has normalized. Feels better overall.  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 Edwena [name] at [facility] and felt not to be VAD candidate due to RV failure, PFTs and compliance issues.  He has chosen to go home on home milrinone for palliative purposes.   Milrinone supplies will cost $35/day and Medicaid won't cover so need to get this figured out and also find [facility] coverage for him prior to d/c. We have spoken to Mark [name] who said it may be Monday at earliest.    Length of Stay: 4  Mark Ancona, MD  06/23/2019, 9:44 AM  Advanced Heart Failure Team Pager [phone] (M-F; 7a - 4p)  Please contact [facility] for night-coverage after hours (4p -7a ) and weekends on [email or web address]

## 2019-06-23 NOTE — Progress Notes (Signed)
ANTICOAGULATION CONSULT NOTE - Follow Up Consult  Pharmacy Consult for warfarin  Indication: apical thrombus  Allergies  Allergen Reactions  . Clindamycin/Lincomycin Swelling and Palpitations  . Hydrocodone Hives  . Lisinopril Swelling and Other (See Comments)    Facial swelling/angioedema  . Olopatadine Hcl Swelling    Makes his face swell up  . Prednisone Shortness Of Breath, Nausea Only, Swelling and Other (See Comments)    Also made chest feel tight and genital area, legs, and face became swollen badly  . Lasix [Furosemide] Hives and Swelling    Facial swelling Reaction to name brand LASIX  . Penicillins Hives and Swelling     Has patient had a PCN reaction causing immediate rash, facial/tongue/throat swelling, SOB or lightheadedness with hypotension: Yes Has patient had a PCN reaction causing severe rash involving mucus membranes or skin necrosis: No Has patient had a PCN reaction that required hospitalization: No Has patient had a PCN reaction occurring within the last 10 years: No If all of the above answers are "NO", then may proceed with Cephalosporin use.   . Bactrim [Sulfamethoxazole-Trimethoprim] Swelling and Rash    Facial swelling    Patient Measurements: Height: 5\' 9"  (175.3 cm) Weight: 143 lb 3.2 oz (65 kg) IBW/kg (Calculated) : 70.7  Vital Signs: Temp: 98.1 F (36.7 C) (11/01 0630) Temp Source: Oral (11/01 0630) BP: 115/81 (11/01 0630) Pulse Rate: 154 (11/01 0630)  Labs: Recent Labs    06/21/19 0500 06/22/19 0440 06/23/19 0511  HGB 10.5* 10.0* 9.5*  HCT 33.7* 32.2* 30.7*  PLT 223 257 197  LABPROT 18.7* 18.3* 19.7*  INR 1.6* 1.5* 1.7*  CREATININE 1.13 1.04 1.01    Estimated Creatinine Clearance: 84.9 mL/min (by C-G formula based on SCr of 1.01 mg/dL).   Medical History: Past Medical History:  Diagnosis Date  . Asthma   . Chronic systolic CHF (congestive heart failure) ([place])   . Cigarette smoker   . CKD (chronic kidney disease), stage II     Archie Endo 10/01/2017  . COPD (chronic obstructive pulmonary disease) ([place]) 10/21/2017   on CT scan chest  . History of echocardiogram    a. Echo 5/17 - EF 20-25%, severe diff HK, restrictive physiology, mild to mod MR, severe reduced RVSF, mod RVE, mild RAE, mod TR, PASP 48 mmHg  . Hx of cardiac cath    a. LHC 5/17 - normal coronary arteries. PA 45/25, mean 33, PCWP mean 18  . NICM (nonischemic cardiomyopathy) ([place])   . Stroke ([place]) 09/27/2017   "was weak on my left side; I'm fully recovered" (11/09/2017)  . Substance abuse (H[facility])    cocaine, marijuana    Medications:  Scheduled:  . chlorhexidine  15 mL Mouth Rinse BID  . Chlorhexidine Gluconate Cloth  6 each Topical Q0600  . furosemide  80 mg Oral BID  . levalbuterol  0.63 mg Nebulization TID  . mouth rinse  15 mL Mouth Rinse q12n4p  . sodium chloride flush  10-40 mL Intracatheter Q12H  . [START ON 06/25/2019] warfarin  2.5 mg Oral Q Tue-1800  . warfarin  5 mg Oral Once per day on Sun Mon Wed Thu Fri Sat  . Warfarin - Pharmacist Dosing Inpatient   Does not apply q1800   Infusions:  . milrinone 0.5 mcg/kg/min (06/23/19 0631)    Assessment: 32 yoM with end-stage cardiomyopathy admitted with acute CHF and cardiogenic shock. Pt on warfarin PTA for hx of apical thrombus on ECHO 12/2018. Pharmacy has been consulted to resume warfarin.  Possible discharge soon.   Warfarin held 10/28 for INR 3.5. INR today increased from 1.5 to 1.7.  *PTA dose = 5mg  daily except 2.5mg  Tues  Goal of Therapy:  INR 2-3 Monitor platelets by anticoagulation protocol: Yes   Plan:  - Continue Warfarin 5mg  daily  - Would continue same dosing at discharge that patient came in on. - Daily PT/INR.    Thank you for allowing pharmacy to participate in this patient's care.  Cashay Manganelli L. , PharmD [facility] PGY1 Pharmacy Resident 06/23/19      8:27 AM  Please check AMION for all [facility] phone numbers After 10:00 PM, call the Main Pharmacy [phone]

## 2019-06-23 NOTE — Progress Notes (Signed)
Patient refused 1800hrs scheduled lasix 80mg ; educated on reason for diuretic.  Patient states he does not want to take this and that he told the doctor this previously, states he does not feel he needs it and he does not want to start cramping.  Discussed CVP reading and fluid volume status, patient continued to refuse.  Dr. Aundra Dubin notified of patient refusal, no new orders at this time.

## 2019-06-24 LAB — PROTIME-INR
INR: 1.9 — ABNORMAL HIGH (ref 0.8–1.2)
Prothrombin Time: 21.6 seconds — ABNORMAL HIGH (ref 11.4–15.2)

## 2019-06-24 LAB — NICOTINE/COTININE METABOLITES
Cotinine: <1 ng/mL
Nicotine: <1 ng/mL

## 2019-06-24 LAB — BASIC METABOLIC PANEL
Anion gap: 9 (ref 5–15)
BUN: 24 mg/dL — ABNORMAL HIGH (ref 6–20)
CO2: 27 mmol/L (ref 22–32)
Calcium: 9.2 mg/dL (ref 8.9–10.3)
Chloride: 97 mmol/L — ABNORMAL LOW (ref 98–111)
Creatinine, Ser: 0.95 mg/dL (ref 0.61–1.24)
GFR calc Af Amer: >60 mL/min (ref >60)
GFR calc non Af Amer: >60 mL/min (ref >60)
Glucose, Bld: 97 mg/dL (ref 70–99)
Potassium: 4.2 mmol/L (ref 3.5–5.1)
Sodium: 133 mmol/L — ABNORMAL LOW (ref 135–145)

## 2019-06-24 LAB — COOXEMETRY PANEL
Carboxyhemoglobin: 1.4 % (ref 0.5–1.5)
Methemoglobin: 1.4 % (ref 0.0–1.5)
O2 Saturation: 50.8 %
Total hemoglobin: 9.8 g/dL — ABNORMAL LOW (ref 12.0–16.0)

## 2019-06-24 LAB — CBC
HCT: 30.3 % — ABNORMAL LOW (ref 39.0–52.0)
Hemoglobin: 9.4 g/dL — ABNORMAL LOW (ref 13.0–17.0)
MCH: 25.8 pg — ABNORMAL LOW (ref 26.0–34.0)
MCHC: 31 g/dL (ref 30.0–36.0)
MCV: 83 fL (ref 80.0–100.0)
Platelets: 211 10*3/uL (ref 150–400)
RBC: 3.65 MIL/uL — ABNORMAL LOW (ref 4.22–5.81)
RDW: 17.3 % — ABNORMAL HIGH (ref 11.5–15.5)
WBC: 4.8 10*3/uL (ref 4.0–10.5)
nRBC: 0 % (ref 0.0–0.2)

## 2019-06-24 MED ORDER — FUROSEMIDE 10 MG/ML IJ SOLN
80.0000 mg | Freq: Every day | INTRAMUSCULAR | Status: DC
  Administered 2019-06-24 – 2019-06-25 (×2): 80 mg via INTRAVENOUS
  Filled 2019-06-24 (×2): qty 8

## 2019-06-24 MED ORDER — METOLAZONE 5 MG PO TABS
2.5000 mg | ORAL_TABLET | Freq: Once | ORAL | Status: AC
  Administered 2019-06-24: 2.5 mg via ORAL
  Filled 2019-06-24: qty 1

## 2019-06-24 MED ORDER — POTASSIUM CHLORIDE CRYS ER 20 MEQ PO TBCR
20.0000 meq | EXTENDED_RELEASE_TABLET | Freq: Once | ORAL | Status: AC
  Administered 2019-06-24: 20 meq via ORAL
  Filled 2019-06-24: qty 1

## 2019-06-24 NOTE — TOC Progression Note (Signed)
Transition of Care [facility]) - Progression Note    Patient Details  Name: Mark [name] MRN: [id number] Date of Birth: 07-11-1976  Transition of Care [facility]) CM/SW Contact  Graves-Bigelow, Ocie Cornfield, RN Phone Number: 06/24/2019, 10:56 AM  Clinical Narrative: CM received call back from Palliative CSW Cheryl-outpatient palliative unable to assist with Milrinone cost for the patient. CM did reach out to Lead Supervisor Olga Coaster and Surveyor, quantity for direction in case. AD stated that he is not able to do a Letter of Guarantee due to insurance Medicaid. Lead Supervisor reaching out to agencies to see if they can assist. At this time patient states he cannot afford the co pay and we have no agency to assist with Milrinone.      Expected Discharge Plan: [place] Barriers to Discharge: Continued Medical Work up  Expected Discharge Plan and Services Expected Discharge Plan: [place] In-house Referral: NA Discharge Planning Services: CM Consult Post Acute Care Choice: [place] arrangements for the past 2 months: Single Family Home HH Arranged: RN(IV Milrinone gtt)     Social Determinants of Health (SDOH) Interventions    Readmission Risk Interventions Readmission Risk Prevention Plan 04/12/2019 02/14/2019 01/25/2019  Transportation Screening Complete Complete Complete  PCP or Specialist Appt within 3-5 Days - Not Complete Complete  Not Complete comments - continue to assess prior d/c. -  HRI or [place] - Not Complete Complete  HRI or Home Care Consult comments - continue to assess -  Social Work Consult for [employer] Planning/Counseling - Not Complete Complete  SW consult not completed comments - continue to assess -  Palliative Care Screening - Not Applicable Complete  Medication Review Press photographer) Complete Not Complete Complete  Med Review Comments - continue to assess till d/c -  PCP or Specialist appointment  within 3-5 days of discharge Complete - -  [place] or Home Care Consult Complete - -  SW Recovery Care/Counseling Consult Complete - -  Palliative Care Screening Not Applicable - -  [place] Not Applicable - -  Some recent data might be hidden

## 2019-06-24 NOTE — Progress Notes (Signed)
Patient refused bath and CHG wipes. Wants to wait until wife leaves bedside. Will relay to night shift.

## 2019-06-24 NOTE — Progress Notes (Addendum)
Patient ID: Mark [name], male   DOB: 06/13/1976, 43 y.o.   MRN: [id number]     Advanced Heart Failure Rounding Note  PCP-Cardiologist: Lauree Chandler, MD   Subjective:    Admitted with recurrent cardiogenic shock. Started back on milrinone 0.375 mcg and diuresed with IV lasix.  Milrinone increased to 0.5 mcg/kg/min  CO-OX 51%.    Refused 1800 lasix and later that night  developed increased shortness of breath. Given breathing treatment and placed on oxgyen.   Says he doesn't feel good today. Complaining of r arm rash.    Objective:   Weight Range: 62.6 kg Body mass index is 20.36 kg/m.   Vital Signs:   Temp:  [97.7 F (36.5 C)-98.9 F (37.2 C)] 97.7 F (36.5 C) (11/02 0414) Pulse Rate:  [55-111] 111 (11/02 0414) Resp:  [16-24] 16 (11/02 0414) BP: (101-110)/(82-89) 108/89 (11/02 0414) SpO2:  [98 %-100 %] 100 % (11/02 0414) Weight:  [62.6 kg] 62.6 kg (11/02 0647) Last BM Date: 06/23/19  Weight change: Filed Weights   06/22/19 0427 06/23/19 0630 06/24/19 0647  Weight: 63.9 kg 65 kg 62.6 kg    Intake/Output:   Intake/Output Summary (Last 24 hours) at 06/24/2019 0710 Last data filed at 06/24/2019 0600 Gross per 24 hour  Intake 1535.09 ml  Output 1410 ml  Net 125.09 ml      Physical Exam  CVP 18-19 personally checked.  General:  No resp difficulty HEENT: normal Neck: supple.JVP to jaw . Carotids 2+ bilat; no bruits. No lymphadenopathy or thryomegaly appreciated. Cor: PMI nondisplaced. Regular rate & rhythm. No rubs,  or murmurs. +S3  Lungs: clear Abdomen: soft, nontender, nondistended. No hepatosplenomegaly. No bruits or masses. Good bowel sounds. Extremities: no cyanosis, clubbing, rash, edema. RUE PICC Neuro: alert & orientedx3, cranial nerves grossly intact. moves all 4 extremities w/o difficulty. Affect pleasant Skin: No rash noted.    Telemetry   ST 100s personally checked.   EKG    n/a Labs    CBC Recent Labs    06/23/19 0511 06/24/19  0505  WBC 4.5 4.8  HGB 9.5* 9.4*  HCT 30.7* 30.3*  MCV 82.5 83.0  PLT 197 638   Basic Metabolic Panel Recent Labs    06/23/19 0511 06/24/19 0505  NA 135 133*  K 4.4 4.2  CL 100 97*  CO2 28 27  GLUCOSE 94 97  BUN 31* 24*  CREATININE 1.01 0.95  CALCIUM 9.1 9.2   Liver Function Tests No results for input(s): AST, ALT, ALKPHOS, BILITOT, PROT, ALBUMIN in the last 72 hours. No results for input(s): LIPASE, AMYLASE in the last 72 hours. Cardiac Enzymes No results for input(s): CKTOTAL, CKMB, CKMBINDEX, TROPONINI in the last 72 hours.  BNP: BNP (last 3 results) Recent Labs    05/08/19 2018 06/09/19 1140 06/19/19 0516  BNP 3,017.6* 2,175.3* 2,045.3*    ProBNP (last 3 results) No results for input(s): PROBNP in the last 8760 hours.   D-Dimer No results for input(s): DDIMER in the last 72 hours. Hemoglobin A1C No results for input(s): HGBA1C in the last 72 hours. Fasting Lipid Panel No results for input(s): CHOL, HDL, LDLCALC, TRIG, CHOLHDL, LDLDIRECT in the last 72 hours. Thyroid Function Tests No results for input(s): TSH, T4TOTAL, T3FREE, THYROIDAB in the last 72 hours.  Invalid input(s): FREET3  Other results:   Imaging    No results found.   Medications:     Scheduled Medications: . chlorhexidine  15 mL Mouth Rinse BID  . Chlorhexidine  Gluconate Cloth  6 each Topical Q0600  . furosemide  80 mg Oral BID  . levalbuterol  0.63 mg Nebulization TID  . mouth rinse  15 mL Mouth Rinse q12n4p  . sodium chloride flush  10-40 mL Intracatheter Q12H  . [START ON 06/25/2019] warfarin  2.5 mg Oral Q Tue-1800  . warfarin  5 mg Oral Once per day on Sun Mon Wed Thu Fri Sat  . Warfarin - Pharmacist Dosing Inpatient   Does not apply q1800    Infusions: . milrinone 0.5 mcg/kg/min (06/24/19 0452)    PRN Medications: acetaminophen, levalbuterol, ondansetron (ZOFRAN) IV, sodium chloride flush    Assessment/Plan   1. Cardiogenic Shock due to a/c Biventricular  systolic HF - ECHO LV 20-25% RV severely reduced.  - Lactic Acid on admit was 8 (24 hours after stopping milrinone) - CO-OX 51% on milrinone 0.5 mcg/kg/min.  -Will go home with palliative milrinone, not a candidate for advanced therapies.  - CVP up to 18-19. Give 80 mg IV lasix daily. Refused pm dose of lasix. Will need to use torsemide once diuresed again.  - Renal function stable.   2. Hyperkalemia - K 7. K corrected in the ED .  -Stable.   3. Acute Respiratory Failure - Resolved. Off bipap  4. AKI on CKD Stage III - Creatinine peaked at 1.85.  Improved with milrinone 0.95.   5. Apical Thrombus  - On coumadin INR 1.9, continue warfarin.   5. H/O MCA CVA 2019  6. Full Code  7. NSVT - off b-blocker with shock - Keep K > 4.0 Mg > 2.0 - Check Mag in am. K 4.2   8. Fe deficiency.  - He did not want IV Fe.      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 [name] at [facility] and felt not to be VAD candidate due to RV failure, PFTs and compliance issues.  He has chosen to go home on home milrinone for palliative purposes.   Milrinone supplies will cost $35/day and Medicaid won't cover so need to get this figured out and also find [facility] coverage for him prior to d/c. We have spoken to [name] who said it may be Monday at earliest.   Palliative Care appreciated. He remains difficult to manage with ongoing refusal of pm diuretics and IV Fe.     Length of Stay: 5  Amy Clegg, NP  06/24/2019, 7:10 AM  Advanced Heart Failure Team Pager [phone] (M-F; 7a - 4p)  Please contact [facility] for night-coverage after hours (4p -7a ) and weekends on [email or web address]  Patient seen and examined with the above-signed Advanced Practice Provider and/or Housestaff. I personally reviewed laboratory data, imaging studies and relevant notes. I independently examined the patient and formulated the important aspects of the plan. I have edited the note to reflect any of my changes or salient  points. I have personally discussed the plan with the patient and/or family.  Refused IV lasix overnight and developed recurrent acute HF and respiratory distress. CVP 18-19 this am. Co-ox remains 51% despite high-dose milrinone at 0.5 mcg/kg/min. Will need to stabilize prior to discharge home with palliative inotropes. His time course is short even with milrinone.   Arvilla Meres, MD  8:39 AM

## 2019-06-24 NOTE — Progress Notes (Signed)
Pt states he will self administer CPAP when he is ready for use.  I advised patient if he has any issues to call.

## 2019-06-24 NOTE — Progress Notes (Signed)
ANTICOAGULATION CONSULT NOTE - Follow Up Consult  Pharmacy Consult for warfarin  Indication: apical thrombus  Allergies  Allergen Reactions  . Bactrim [Sulfamethoxazole-Trimethoprim] Swelling and Rash    Facial swelling  . Clindamycin/Lincomycin Swelling and Palpitations  . Hydrocodone Hives  . Lasix [Furosemide] Hives and Swelling    Facial swelling Reaction to name brand LASIX  . Lisinopril Swelling and Other (See Comments)    Facial swelling/angioedema  . Olopatadine Hcl Swelling    Makes his face swell up  . Penicillins Hives and Swelling     Has patient had a PCN reaction causing immediate rash, facial/tongue/throat swelling, SOB or lightheadedness with hypotension: Yes Has patient had a PCN reaction causing severe rash involving mucus membranes or skin necrosis: No Has patient had a PCN reaction that required hospitalization: No Has patient had a PCN reaction occurring within the last 10 years: No If all of the above answers are "NO", then may proceed with Cephalosporin use.   . Prednisone Shortness Of Breath, Nausea Only, Swelling and Other (See Comments)    Also made chest feel tight and genital area, legs, and face became swollen badly    Patient Measurements: Height: 5\' 9"  (175.3 cm) Weight: 137 lb 14.4 oz (62.6 kg) IBW/kg (Calculated) : 70.7  Vital Signs: Temp: 97.6 F (36.4 C) (11/02 0854) Temp Source: Oral (11/02 0854) BP: 104/82 (11/02 0854) Pulse Rate: 105 (11/02 1330)  Labs: Recent Labs    06/22/19 0440 06/23/19 0511 06/24/19 0505  HGB 10.0* 9.5* 9.4*  HCT 32.2* 30.7* 30.3*  PLT 257 197 211  LABPROT 18.3* 19.7* 21.6*  INR 1.5* 1.7* 1.9*  CREATININE 1.04 1.01 0.95    Estimated Creatinine Clearance: 86.9 mL/min (by C-G formula based on SCr of 0.95 mg/dL).   Medical History: Past Medical History:  Diagnosis Date  . Asthma   . Chronic systolic CHF (congestive heart failure) (HCC)   . Cigarette smoker   . CKD (chronic kidney disease), stage  II    13/02/20 10/01/2017  . COPD (chronic obstructive pulmonary disease) (HCC) 10/21/2017   on CT scan chest  . History of echocardiogram    a. Echo 5/17 - EF 20-25%, severe diff HK, restrictive physiology, mild to mod MR, severe reduced RVSF, mod RVE, mild RAE, mod TR, PASP 48 mmHg  . Hx of cardiac cath    a. LHC 5/17 - normal coronary arteries. PA 45/25, mean 33, PCWP mean 18  . NICM (nonischemic cardiomyopathy) (HCC)   . Stroke (HCC) 09/27/2017   "was weak on my left side; I'm fully recovered" (11/09/2017)  . Substance abuse (HCC)    cocaine, marijuana    Medications:  Scheduled:  . chlorhexidine  15 mL Mouth Rinse BID  . Chlorhexidine Gluconate Cloth  6 each Topical Q0600  . furosemide  80 mg Intravenous Daily  . levalbuterol  0.63 mg Nebulization TID  . mouth rinse  15 mL Mouth Rinse q12n4p  . sodium chloride flush  10-40 mL Intracatheter Q12H  . [START ON 06/25/2019] warfarin  2.5 mg Oral Q Tue-1800  . warfarin  5 mg Oral Once per day on Sun Mon Wed Thu Fri Sat  . Warfarin - Pharmacist Dosing Inpatient   Does not apply q1800   Infusions:  . milrinone 0.5 mcg/kg/min (06/24/19 0452)    Assessment: 43 yoM with end-stage cardiomyopathy admitted with acute CHF and cardiogenic shock. Pt on warfarin PTA for hx of apical thrombus on ECHO 12/2018. Pharmacy has been consulted to resume warfarin.  Possible discharge soon.   Warfarin held 10/28 for INR 3.5. INR  increased from 1.5 > 1.7> 1.9 on home dose- will continue No bleeding noted, h/h low stable pltc 200 *PTA dose = 5mg  daily except 2.5mg  Tues  Goal of Therapy:  INR 2-3 Monitor platelets by anticoagulation protocol: Yes   Plan:  - Continue Warfarin 5mg  daily with 2.5mg  on Tues - Would continue same dosing at discharge that patient came in on. - Daily PT/INR.  Bonnita Nasuti Pharm.D. CPP, BCPS Clinical Pharmacist [phone] 06/24/2019 2:24 PM    Please check AMION for all [place] phone numbers After 10:00 PM, call  the [place] [phone]

## 2019-06-24 NOTE — Progress Notes (Addendum)
Pt requested breathing treatment for SOB. Pt with very diminished breath sounds throughout, faint scattered exp wheezes., RR 18. Respiratory called and RT stated he couldn't have treatment unitl 0140. Pt very upset about not getting treatment. I explained to pt that his biggest issue at this time was the fact he refused his lasix at 6pm. Pts CVP at midnight 17-18. No obvious edema to extremities, (+) JVD. Pt states he is not taking the lasix and was "gonna call Dr Haroldine Laws to set yall straight". Pt attempted to call Dr Haroldine Laws and Amy NP on his cell phone, unable to reach either. I had asked patient if he wanted to call them to let me speak to either one of them to explain the situation.  I did go ahead and give pt Xopnex neb via PRN order, and applied O2 2/L [place] for comfort, although patients O2 sat was 98% on room air Will continue to monitor. Jessie Foot, RN

## 2019-06-25 ENCOUNTER — Encounter (HOSPITAL_COMMUNITY): Payer: Medicaid Other

## 2019-06-25 LAB — COOXEMETRY PANEL
Carboxyhemoglobin: 1.2 % (ref 0.5–1.5)
Methemoglobin: 0.9 % (ref 0.0–1.5)
O2 Saturation: 69 %
Total hemoglobin: 9.8 g/dL — ABNORMAL LOW (ref 12.0–16.0)

## 2019-06-25 LAB — CBC
HCT: 31.1 % — ABNORMAL LOW (ref 39.0–52.0)
Hemoglobin: 9.6 g/dL — ABNORMAL LOW (ref 13.0–17.0)
MCH: 25.6 pg — ABNORMAL LOW (ref 26.0–34.0)
MCHC: 30.9 g/dL (ref 30.0–36.0)
MCV: 82.9 fL (ref 80.0–100.0)
Platelets: 195 10*3/uL (ref 150–400)
RBC: 3.75 MIL/uL — ABNORMAL LOW (ref 4.22–5.81)
RDW: 17.6 % — ABNORMAL HIGH (ref 11.5–15.5)
WBC: 5.1 10*3/uL (ref 4.0–10.5)
nRBC: 0.4 % — ABNORMAL HIGH (ref 0.0–0.2)

## 2019-06-25 LAB — BASIC METABOLIC PANEL
Anion gap: 12 (ref 5–15)
BUN: 20 mg/dL (ref 6–20)
CO2: 29 mmol/L (ref 22–32)
Calcium: 9.7 mg/dL (ref 8.9–10.3)
Chloride: 92 mmol/L — ABNORMAL LOW (ref 98–111)
Creatinine, Ser: 0.98 mg/dL (ref 0.61–1.24)
GFR calc Af Amer: >60 mL/min (ref >60)
GFR calc non Af Amer: >60 mL/min (ref >60)
Glucose, Bld: 115 mg/dL — ABNORMAL HIGH (ref 70–99)
Potassium: 4 mmol/L (ref 3.5–5.1)
Sodium: 133 mmol/L — ABNORMAL LOW (ref 135–145)

## 2019-06-25 LAB — PROTIME-INR
INR: 1.9 — ABNORMAL HIGH (ref 0.8–1.2)
Prothrombin Time: 21.1 seconds — ABNORMAL HIGH (ref 11.4–15.2)

## 2019-06-25 MED ORDER — TORSEMIDE 20 MG PO TABS
60.0000 mg | ORAL_TABLET | Freq: Every day | ORAL | Status: DC
  Administered 2019-06-25: 60 mg via ORAL
  Filled 2019-06-25: qty 3

## 2019-06-25 MED ORDER — ALBUTEROL SULFATE HFA 108 (90 BASE) MCG/ACT IN AERS: 1.0000 | INHALATION_SPRAY | RESPIRATORY_TRACT | 3 refills | Status: DC | PRN

## 2019-06-25 MED ORDER — POTASSIUM CHLORIDE CRYS ER 20 MEQ PO TBCR: 20.0000 meq | EXTENDED_RELEASE_TABLET | Freq: Every day | ORAL | 0 refills | Status: AC

## 2019-06-25 MED ORDER — MILRINONE LACTATE IN DEXTROSE 20-5 MG/100ML-% IV SOLN: 0.5000 ug/kg/min | INTRAVENOUS | 52 refills | Status: DC

## 2019-06-25 MED ORDER — TORSEMIDE 20 MG PO TABS: 60.0000 mg | ORAL_TABLET | Freq: Every day | ORAL | 6 refills | Status: AC

## 2019-06-25 MED ORDER — WARFARIN SODIUM 2 MG PO TABS
3.0000 mg | ORAL_TABLET | Freq: Once | ORAL | Status: AC
  Administered 2019-06-25: 3 mg via ORAL
  Filled 2019-06-25: qty 1

## 2019-06-25 MED FILL — VENTOLIN HFA 90 MCG INHALER: 108 (90 BAS | 17 days supply | Qty: 18 | Fill #0

## 2019-06-25 MED FILL — POTASSIUM CL ER 20 MEQ TABL: 20 | 60 days supply | Qty: 60 | Fill #0

## 2019-06-25 MED FILL — TORSEMIDE 20 MG TABLET: 20 | 30 days supply | Qty: 90 | Fill #0

## 2019-06-25 NOTE — TOC Transition Note (Signed)
Transition of Care [facility]) - CM/SW Discharge Note   Patient Details  Name: Mark [name] MRN: [id number] Date of Birth: 1976/04/19  [facility] CM/SW Contact:  Bethena Roys, RN Phone Number: 06/25/2019, 10:24 AM   Clinical Narrative: Plan for late transition home with [place] for Milrinone gtt. Carolynn Sayers with [facility] worked diligently to get this patient serviced via [employer]. No further needs from CM at this time.    Final next level of care: Home w Hospice Care Barriers to Discharge: No Barriers Identified   Patient Goals and CMS Choice Patient states their goals for this hospitalization and ongoing recovery are:: "to feel better" CMS Medicare.gov Compare Post Acute Care list provided to:: Patient Choice offered to / list presented to : Patient   Discharge Plan and Services In-house Referral: NA Discharge Planning Services: CM Consult Post Acute Care Choice: Hospice            HH Arranged: RN(IV Milrinone gtt) [place] Agency: Other - See comment([facility]) Date [place]: 06/24/19   Representative spoke with at [place]: Carolynn Sayers worked with Glyn Ade to set up [place] with [facility].   Readmission Risk Interventions Readmission Risk Prevention Plan 04/12/2019 02/14/2019 01/25/2019  Transportation Screening Complete Complete Complete  PCP or Specialist Appt within 3-5 Days - Not Complete Complete  Not Complete comments - continue to assess prior d/c. -  HRI or [place] - Not Complete Complete  HRI or Home Care Consult comments - continue to assess -  Social Work Consult for [place] Planning/Counseling - Not Complete Complete  SW consult not completed comments - continue to assess -  Palliative Care Screening - Not Applicable Complete  Medication Review Press photographer) Complete Not Complete Complete  Med Review Comments - continue to assess till d/c -  PCP or  Specialist appointment within 3-5 days of discharge Complete - -  [place] or Home Care Consult Complete - -  SW Recovery Care/Counseling Consult Complete - -  Palliative Care Screening Not Applicable - -  [place] Not Applicable - -  Some recent data might be hidden

## 2019-06-25 NOTE — Progress Notes (Addendum)
Patient ID: Mark [name], male   DOB: [birth year], 43 y.o.   MRN: [id number]     Advanced Heart Failure Rounding Note  PCP-Cardiologist: Verne Carrow, MD   Subjective:    Admitted with recurrent cardiogenic shock. Started back on milrinone 0.375 mcg and diuresed with IV lasix.  Milrinone increased to 0.5 mcg/kg/min  CO-OX 69%.   Yesterday diuresed with IV lasix.  Weight down 6 pounds.    Feeling better today. Denies SOB. Want to go home with Hospice  + milrinone.     Objective:   Weight Range: 62.6 kg Body mass index is 20.36 kg/m.   Vital Signs:   Temp:  [97.6 F (36.4 C)-97.8 F (36.6 C)] 97.8 F (36.6 C) (11/03 0618) Pulse Rate:  [47-108] 47 (11/03 0618) Resp:  [16-20] 20 (11/02 1954) BP: (103-120)/(72-91) 103/72 (11/03 0618) SpO2:  [97 %-100 %] 98 % (11/03 0618) Last BM Date: 06/23/19  Weight change: Filed Weights   06/22/19 0427 06/23/19 0630 06/24/19 0647  Weight: 63.9 kg 65 kg 62.6 kg    Intake/Output:   Intake/Output Summary (Last 24 hours) at 06/25/2019 0713 Last data filed at 06/25/2019 0600 Gross per 24 hour  Intake 967.56 ml  Output 3025 ml  Net -2057.44 ml      Physical Exam  CVP 10-11 personally checked.  General:  Sitting on the side of the bed. No resp difficulty HEENT: normal Neck: supple. JVP ~10 . Carotids 2+ bilat; no bruits. No lymphadenopathy or thryomegaly appreciated. Cor: PMI nondisplaced. Regular rate & rhythm. No rubs, or murmurs. + S3  Lungs: clear Abdomen: soft, nontender, nondistended. No hepatosplenomegaly. No bruits or masses. Good bowel sounds. Extremities: no cyanosis, clubbing, rash, edema. RUE PICC Neuro: alert & orientedx3, cranial nerves grossly intact. moves all 4 extremities w/o difficulty. Affect pleasant    Telemetry   Sinus Tach 100s personally checked.   EKG    n/a Labs    CBC Recent Labs    06/24/19 0505 06/25/19 0405  WBC 4.8 5.1  HGB 9.4* 9.6*  HCT 30.3* 31.1*  MCV 83.0 82.9  PLT  211 195   Basic Metabolic Panel Recent Labs    57/84/69 0511 06/24/19 0505  NA 135 133*  K 4.4 4.2  CL 100 97*  CO2 28 27  GLUCOSE 94 97  BUN 31* 24*  CREATININE 1.01 0.95  CALCIUM 9.1 9.2   Liver Function Tests No results for input(s): AST, ALT, ALKPHOS, BILITOT, PROT, ALBUMIN in the last 72 hours. No results for input(s): LIPASE, AMYLASE in the last 72 hours. Cardiac Enzymes No results for input(s): CKTOTAL, CKMB, CKMBINDEX, TROPONINI in the last 72 hours.  BNP: BNP (last 3 results) Recent Labs    05/08/19 2018 06/09/19 1140 06/19/19 0516  BNP 3,017.6* 2,175.3* 2,045.3*    ProBNP (last 3 results) No results for input(s): PROBNP in the last 8760 hours.   D-Dimer No results for input(s): DDIMER in the last 72 hours. Hemoglobin A1C No results for input(s): HGBA1C in the last 72 hours. Fasting Lipid Panel No results for input(s): CHOL, HDL, LDLCALC, TRIG, CHOLHDL, LDLDIRECT in the last 72 hours. Thyroid Function Tests No results for input(s): TSH, T4TOTAL, T3FREE, THYROIDAB in the last 72 hours.  Invalid input(s): FREET3  Other results:   Imaging    No results found.   Medications:     Scheduled Medications: . chlorhexidine  15 mL Mouth Rinse BID  . Chlorhexidine Gluconate Cloth  6 each Topical Q0600  . furosemide  80 mg Intravenous Daily  . levalbuterol  0.63 mg Nebulization TID  . mouth rinse  15 mL Mouth Rinse q12n4p  . sodium chloride flush  10-40 mL Intracatheter Q12H  . warfarin  2.5 mg Oral Q Tue-1800  . warfarin  5 mg Oral Once per day on Sun Mon Wed Thu Fri Sat  . Warfarin - Pharmacist Dosing Inpatient   Does not apply q1800    Infusions: . milrinone 0.5 mcg/kg/min (06/25/19 0600)    PRN Medications: acetaminophen, levalbuterol, ondansetron (ZOFRAN) IV, sodium chloride flush    Assessment/Plan   1. Cardiogenic Shock due to a/c Biventricular systolic HF - ECHO LV 71-06% RV severely reduced.  - Lactic Acid on admit was 8 (24  hours after stopping milrinone) - CO-OX 69%  on milrinone 0.5 mcg/kg/min.  -Will go home with palliative milrinone, not a candidate for advanced therapies.  - Stop IV lasix lasix switch to torsemide 60 mg daily. - Renal function pending.   2. Hyperkalemia - K 7. K corrected in the ED .  -Stable.   3. Acute Respiratory Failure - Resolved. Off bipap  4. AKI on CKD Stage III - Creatinine peaked at 1.85.  Improved with milrinone 0.95.   5. Apical Thrombus  - On coumadin INR 1.9, continue warfarin.   5. H/O MCA CVA 2019  6. Full Code  7. NSVT - off b-blocker with shock - Keep K > 4.0 Mg > 2.0 - Check Mag in am. K 4.2   8. Fe deficiency.  - He did not want IV Fe.      Dr. Haroldine Laws has discussed his case with Dr. Mosetta Pigeon at [facility] and felt not to be VAD candidate due to RV failure, PFTs and compliance issues.  He has chosen to go home on home milrinone for palliative purposes.   Milrinone supplies will cost $35/day and Medicaid won't cover so need to get this figured out and also find [facility] coverage for him prior to d/c. We have spoken to Carolynn Sayers who said it may be Monday at earliest.   [place] appreciated. He remains difficult to manage with ongoing refusal of pm diuretics and IV Fe.    Will follow up with case manager regarding possible d/c today with Hospice + milrinone. Today he is agreeable to Hospice. Wants to go home today.   BMET pending.   Plans for [facility] to follow. [facility] will provide home milrinone.   Length of Stay: [place], NP  06/25/2019, 7:13 AM  Advanced Heart Failure Team Pager [phone] (M-F; [place])  Please contact [facility] for night-coverage after hours (4p -7a ) and weekends on [email or web address]  Patient seen and examined with the above-signed Advanced Practice Provider and/or Housestaff. I personally reviewed laboratory data, imaging studies and relevant notes. I independently examined the patient and formulated  the important aspects of the plan. I have edited the note to reflect any of my changes or salient points. I have personally discussed the plan with the patient and/or family.  He is improved with diuresis today. Co-oc ok on high-dose milrinone. CVP down to 10-11. After extensive efforts, we have been able to secure him coverage for home palliative milrinone with support from [facility]. We will plan d/c later today.   Glori Bickers, MD  8:14 AM

## 2019-06-25 NOTE — Progress Notes (Signed)
CSW received call from bedside RN stating that patient and girlfriend wish to speak with CSW. CSW contacted patient and girlfriend via phone and they shared that patient is agreeable to hospice and would like to go home asap. CSW explained that due to the time of day that it would be impossible to get the referral to hospice and the needed medications for patient to go home tonight. [name] and patient both verbalize understanding and hopeful for discharge home tomorrow via hospice. CSW informed staff of call. CSW available as needed. Raquel Sarna, [place], [place]

## 2019-06-25 NOTE — Progress Notes (Signed)
CSW returned to bedside to continue goals of care conversation. Patient was on the phone with his girlfriend Mark [name] at the time of visit. CSW discussed again the option for hospice and the support that would be provided at home for patient. Patient stated he is tired and weak and no longer able to walk to the bathroom without extreme fatigue. Patient's girlfriend asking appropriate questions and encouraging patient to consider Hospice. She shared with him that it would allow him to be home with family at his side. CSW provided supportive intervention and asked patient/girlfriend to call with further questions. CSW left patient to consider his options with girlfriend on the phone. CSW will continue to follow for supportive needs. Mark [name], [place], [place]

## 2019-06-25 NOTE — Progress Notes (Addendum)
ANTICOAGULATION CONSULT NOTE - Follow Up Consult  Pharmacy Consult for warfarin  Indication: apical thrombus  Allergies  Allergen Reactions  . Bactrim [Sulfamethoxazole-Trimethoprim] Swelling and Rash    Facial swelling  . Clindamycin/Lincomycin Swelling and Palpitations  . Hydrocodone Hives  . Lasix [Furosemide] Hives and Swelling    Facial swelling Reaction to name brand LASIX  . Lisinopril Swelling and Other (See Comments)    Facial swelling/angioedema  . Olopatadine Hcl Swelling    Makes his face swell up  . Penicillins Hives and Swelling     Has patient had a PCN reaction causing immediate rash, facial/tongue/throat swelling, SOB or lightheadedness with hypotension: Yes Has patient had a PCN reaction causing severe rash involving mucus membranes or skin necrosis: No Has patient had a PCN reaction that required hospitalization: No Has patient had a PCN reaction occurring within the last 10 years: No If all of the above answers are "NO", then may proceed with Cephalosporin use.   . Prednisone Shortness Of Breath, Nausea Only, Swelling and Other (See Comments)    Also made chest feel tight and genital area, legs, and face became swollen badly    Patient Measurements: Height: 5\' 9"  (175.3 cm) Weight: 145 lb 12.8 oz (66.1 kg) IBW/kg (Calculated) : 70.7  Vital Signs: Temp: 97.8 F (36.6 C) (11/03 0618) Temp Source: Oral (11/03 0618) BP: 103/72 (11/03 0618) Pulse Rate: 47 (11/03 0618)  Labs: Recent Labs    06/23/19 0511 06/24/19 0505 06/25/19 0405  HGB 9.5* 9.4* 9.6*  HCT 30.7* 30.3* 31.1*  PLT 197 211 195  LABPROT 19.7* 21.6* 21.1*  INR 1.7* 1.9* 1.9*  CREATININE 1.01 0.95  --     Estimated Creatinine Clearance: 91.8 mL/min (by C-G formula based on SCr of 0.95 mg/dL).   Medical History: Past Medical History:  Diagnosis Date  . Asthma   . Chronic systolic CHF (congestive heart failure) ([place])   . Cigarette smoker   . CKD (chronic kidney disease), stage II     Archie Endo 10/01/2017  . COPD (chronic obstructive pulmonary disease) ([place]) 10/21/2017   on CT scan chest  . History of echocardiogram    a. Echo 5/17 - EF 20-25%, severe diff HK, restrictive physiology, mild to mod MR, severe reduced RVSF, mod RVE, mild RAE, mod TR, PASP 48 mmHg  . Hx of cardiac cath    a. LHC 5/17 - normal coronary arteries. PA 45/25, mean 33, PCWP mean 18  . NICM (nonischemic cardiomyopathy) ([place])   . Stroke ([place]) 09/27/2017   "was weak on my left side; I'm fully recovered" (11/09/2017)  . Substance abuse (HCC)    cocaine, marijuana    Medications:  Scheduled:  . chlorhexidine  15 mL Mouth Rinse BID  . Chlorhexidine Gluconate Cloth  6 each Topical Q0600  . furosemide  80 mg Intravenous Daily  . levalbuterol  0.63 mg Nebulization TID  . mouth rinse  15 mL Mouth Rinse q12n4p  . sodium chloride flush  10-40 mL Intracatheter Q12H  . torsemide  60 mg Oral Daily  . warfarin  2.5 mg Oral Q Tue-1800  . warfarin  5 mg Oral Once per day on Sun Mon Wed Thu Fri Sat  . Warfarin - Pharmacist Dosing Inpatient   Does not apply q1800   Infusions:  . milrinone 0.5 mcg/kg/min (06/25/19 0600)    Assessment: 29 yoM with end-stage cardiomyopathy admitted with acute CHF and cardiogenic shock. Pt on warfarin PTA for hx of apical thrombus on ECHO 12/2018.  Pharmacy has been consulted to resume warfarin. Possible discharge soon.   Warfarin held 10/28 for INR 3.5. INR  increased from 1.5 > 1.7> 1.9 on home dose- remains at 1.9 today. Hgb 9.6, plt 195. No bleeding noted.   *PTA dose = 5mg  daily except 2.5mg  Tues  Goal of Therapy:  INR 2-3 Monitor platelets by anticoagulation protocol: Yes   Plan:  - Will give warfarin 3 mg today prior to discharge to get INR into goal range - Would continue same dosing at discharge that patient came in on. - Daily PT/INR.  , PharmD, BCCCP Clinical Pharmacist  Phone: [phone]  Please check AMION for all [facility] phone  numbers After 10:00 PM, call Main Pharmacy [phone] 06/25/2019 8:27 AM

## 2019-06-25 NOTE — Progress Notes (Signed)
CSW met at bedside with patient to discuss goals of care and option for hospice. Patient shared frustrations with his health and medications. He shared that he wished he had never started on the milrinone as he believes that it was the medication that made his health decline. CSW discussed his decline and attempted to continue goals of care conversation. Patient appeared frustrated and in denial about his current health and limited time due to failing heart. Patient agreeable to CSW returning in a few hours giving him some time to absorb the conversation and options for discharge. CSW will return to bedside later today. Raquel Sarna, [place], [place]

## 2019-06-26 ENCOUNTER — Telehealth: Payer: Self-pay

## 2019-06-26 NOTE — Telephone Encounter (Signed)
Transition Care Management Follow-up Telephone Call  Date of discharge and from where: 06/25/2019, [facility]  How have you been since you were released from the hospital? He stated he is " feeling all right."   Any questions or concerns? None reported  Items Reviewed:  Did the pt receive and understand the discharge instructions provided? Yes, no questions at this time  Medications obtained and verified? He said he has all medications and noted that the only new one is  -torsemide . The milrinone infusion is managed by [facility], no questions about his medications.   Any new allergies since your discharge? None reported   Do you have support at home? Yes, he said he does not live alone but did not elaborate on his living situation.   Other (ie: DME, Home Health, etc)   [facility]   - manages milrinone infusion - per patient.  Community paramedicine involved - Dee Copper, EMT  Functional Questionnaire: (I = Independent and D = Dependent) ADL's: independent, no assistive device needed.    Follow up appointments reviewed:    PCP Hospital f/u appt confirmed? He did not want to make an appt with PCP.  He said it was not necessary  [facility] f[facility]u appt confirmed? Cardiology/heart failure - 07/02/2019   Are transportation arrangements needed?he did not mention a need for transportation assistance.   If their condition worsens, is the pt aware to call  their PCP or go to the ED? Yes,he understands to call the HF clinic.   Was the patient provided with contact information for the PCP's office or ED? He has the contact information for the heart failure clinic  Was the pt encouraged to call back with questions or concerns? yes

## 2019-07-02 ENCOUNTER — Encounter (HOSPITAL_COMMUNITY): Payer: Medicaid Other

## 2019-07-02 ENCOUNTER — Other Ambulatory Visit (HOSPITAL_COMMUNITY): Payer: Self-pay

## 2019-07-02 NOTE — Progress Notes (Signed)
Paramedicine Encounter    Patient ID: Mark [name], male    DOB: 1976-06-03, 43 y.o.   MRN: [id number]    Patient Care Team: Grayce Sessions, NP as PCP - General (Internal Medicine) Kathleene Hazel, MD as PCP - Cardiology (Cardiology) Bensimhon, Bevelyn Buckles, MD as PCP - Advanced Heart Failure (Cardiology)  Patient Active Problem List   Diagnosis Date Noted  . Palliative care by specialist   . Acute kidney injury (nontraumatic) (HCC)   . Right middle lobe pneumonia 06/09/2019  . Polysubstance abuse (HCC) 06/09/2019  . Chronic hyponatremia 06/09/2019  . Malnutrition of moderate degree 04/10/2019  . Cardiogenic shock (HCC) 04/09/2019  . Allergic reaction caused by a drug 02/14/2019  . Abnormal liver function 02/14/2019  . Encounter for therapeutic drug monitoring 02/13/2019  . Goals of care, counseling/discussion 02/13/2019  . 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 Acute CHF (congestive heart failure) (HCC) 01/24/2019  . Shortness of breath   . Cocaine use   . Acute on chronic systolic (congestive) heart failure (HCC) 12/25/2018  . Demand ischemia (HCC)   . LV (left ventricular) mural thrombus without MI (HCC)   . Noncompliance   . Chronic anemia   . Acute systolic CHF (congestive heart failure) (HCC) 11/09/2017  . History of right MCA stroke 09/28/2017  . Stroke (HCC) 09/28/2017  . Chronic combined systolic and diastolic congestive heart failure (HCC) 01/11/2016  . NICM (nonischemic cardiomyopathy) (HCC) 01/11/2016  . CKD (chronic kidney disease) stage 2, GFR 60-89 ml/min 01/11/2016  . Cocaine abuse (HCC) 01/11/2016  . Chest pain, pleuritic 01/03/2016  . Tobacco abuse 01/03/2016  . Asthma 01/03/2016  . Leg swelling 01/03/2016  . Tachycardia 01/03/2016  . Normocytic anemia 01/03/2016  . Elevated troponin I level 01/03/2016  . Right rib fracture 01/03/2016  . Chest pain     Current Outpatient Medications:  .  albuterol (VENTOLIN HFA) 108 (90 Base) MCG/ACT  inhaler, Inhale 1-2 puffs into the lungs every 4 (four) hours as needed for wheezing or shortness of breath., Disp: 8.5 g, Rfl: 3 .  digoxin (LANOXIN) 0.125 MG tablet, Take 1 tablet (0.125 mg total) by mouth daily., Disp: 30 tablet, Rfl: 5 .  milrinone (PRIMACOR) 20 MG/100 ML SOLN infusion, Inject 0.0332 mg/min into the vein continuous. Per [facility], Disp: 100 mL, Rfl: 52 .  Multiple Vitamin (MULTIVITAMIN WITH MINERALS) TABS tablet, Take 1 tablet by mouth daily., Disp: , Rfl:  .  potassium chloride SA (KLOR-CON) 20 MEQ tablet, Take 1 tablet (20 mEq total) by mouth daily., Disp: 60 tablet, Rfl: 0 .  torsemide (DEMADEX) 20 MG tablet, Take 3 tablets (60 mg total) by mouth daily., Disp: 90 tablet, Rfl: 6 .  warfarin (COUMADIN) 5 MG tablet, 5 mg daily except 2.5 mg on Tuesday (Patient taking differently: Take 2.5-5 mg by mouth See admin instructions. Take 1/2 tablet (2.5 mg) by mouth on Tuesday evening at 6pm, take 1 tablet (5 mg) on all other evenings of the week - at 6pm), Disp: 30 tablet, Rfl: 1 Allergies  Allergen Reactions  . Bactrim [Sulfamethoxazole-Trimethoprim] Swelling and Rash    Facial swelling  . Clindamycin/Lincomycin Swelling and Palpitations  . Hydrocodone Hives  . Lasix [Furosemide] Hives and Swelling    Facial swelling Reaction to name brand LASIX  . Lisinopril Swelling and Other (See Comments)    Facial swelling/angioedema  . Olopatadine Hcl Swelling    Makes his face swell up  . Penicillins Hives and Swelling  Has patient had a PCN reaction causing immediate rash, facial/tongue/throat swelling, SOB or lightheadedness with hypotension: Yes Has patient had a PCN reaction causing severe rash involving mucus membranes or skin necrosis: No Has patient had a PCN reaction that required hospitalization: No Has patient had a PCN reaction occurring within the last 10 years: No If all of the above answers are "NO", then may proceed with Cephalosporin use.   . Prednisone  Shortness Of Breath, Nausea Only, Swelling and Other (See Comments)    Also made chest feel tight and genital area, legs, and face became swollen badly     Social History   Socioeconomic History  . Marital status: Divorced    Spouse name: Not on file  . Number of children: Not on file  . Years of education: Not on file  . Highest education level: Not on file  Occupational History  . Not on file  Social Needs  . Financial resource strain: Somewhat hard  . Food insecurity    Worry: Sometimes true    Inability: Sometimes true  . Transportation needs    Medical: No    Non-medical: No  Tobacco Use  . Smoking status: Former Smoker    Packs/day: 1.00    Years: 30.00    Pack years: 30.00    Types: Cigarettes    Quit date: 09/27/2017    Years since quitting: 1.7  . Smokeless tobacco: Never Used  Substance and Sexual Activity  . Alcohol use: Yes    Alcohol/week: 5.0 standard drinks    Types: 5 Shots of liquor per week    Frequency: Never    Comment: 5-6 shots of vodka daily; "stopped it all after I had stroke 09/27/2017"  . Drug use: Yes    Types: Cocaine, Marijuana    Comment: 11/09/2017 "none since 09/27/2017"  . Sexual activity: Not Currently  Lifestyle  . Physical activity    Days per week: 0 days    Minutes per session: 0 min  . Stress: To some extent  Relationships  . Social connections    Talks on phone: More than three times a week    Gets together: More than three times a week    Attends religious service: 1 to 4 times per year    Active member of club or organization: No    Attends meetings of clubs or organizations: Never    Relationship status: Divorced  . Intimate partner violence    Fear of current or ex partner: No    Emotionally abused: No    Physically abused: No    Forced sexual activity: No  Other Topics Concern  . Not on file  Social History Narrative  . Not on file    Physical Exam Pulmonary:     Effort: No respiratory distress.  Skin:     General: Skin is warm and dry.         No future appointments.   BP (!) 88/68 (BP Location: Left Arm, Patient Position: Sitting, Cuff Size: Normal)   Pulse 99   Temp 98.1 F (36.7 C)   SpO2 98%    ATF pt CAO x4 sitting on the sofa, he said that he's worried that the RN isn't coming to change the IV bag of Milrnone.  He thought that she was supposed to come eariler today.  He denies sob, chest pain and dizziness.  He said that if the RN doesn't come within 45 mins, he's going to the ED. The monitor  says he has 1 hr and 26 mins left of med/iv fluid.  Eileen Stanford was able to get into contact with the RN; She called Mr. Kutner and told him that she was on her way. I'm unable to see his medications during this visit. He's too agitated to "check his weight or do anything else until his nurse comes".  I spoke with Jasmine at the [facility] about his blood pressure; no othostatic noted when he stood up.  Mr. Morini isn't dizzy, lethargic or diaphoretic.  She said for him to monitor how he feels and if he notice any changes call for help.    Pau Banh, EMT Paramedic [phone] 07/02/2019    ACTION: Home visit completed

## 2019-07-03 ENCOUNTER — Telehealth (HOSPITAL_COMMUNITY): Payer: Self-pay

## 2019-07-03 NOTE — Telephone Encounter (Signed)
Received call from patients case manager York Cerise [phone].  She called to reschedule patients appointment as he missed his 11/10 appt due to waiting for the infusion nurse. Pt rescheduled for 11/16.  She will communicate date and time to patient.

## 2019-07-03 NOTE — Telephone Encounter (Addendum)
Pt was discharged from the hospital and will have [place] per hospital discharge and on a Milrinone drip. The discharge summary states the pt the pt will have weekly labs and an INR next week (no date) so called [facility] services and they Clinical person that could assist was in a meeting. Therefore, they will call me back once she has time to look at the information on the pt.

## 2019-07-03 NOTE — Telephone Encounter (Addendum)
Received a call back from [place] with [facility] and she stated that they do not have an orders for an INR and that the pt is not taking any Warfarin. She stated that he is on the Milrinone infusion. Also, she stated the pt missed his appt with the [facility] and needed to reschedule so gave her the number for the pt to call and she states she will assist him with rescheduling appt. Will contact the HF Clinic and make the MD aware that the pt is not taking Warfarin.

## 2019-07-04 ENCOUNTER — Telehealth (HOSPITAL_COMMUNITY): Payer: Self-pay | Admitting: *Deleted

## 2019-07-04 NOTE — Telephone Encounter (Signed)
Mark [name] with coumadin clinic left VM requesting call about patient. I called back no answer/left VM for her to call back .

## 2019-07-04 NOTE — Telephone Encounter (Signed)
Called [facility] to ensure that the pt's MD is aware that the pt is not taking taking Warfarin since hospital d/c per [facility] Nurse (it was on the discharge to continue & that Rufus would obtain INR) but had to leave a message on the triage line. Pt does have an appt on 07/08/2019 with HF Clinic and if they resume the Warfarin we would need to know to ensure follow. Will await a call back.

## 2019-07-05 MED FILL — DIGOXIN 0.125 MG TABLET: 125 | 30 days supply | Qty: 30 | Fill #3

## 2019-07-08 ENCOUNTER — Telehealth (HOSPITAL_COMMUNITY): Payer: Self-pay | Admitting: Licensed Clinical Social Worker

## 2019-07-08 ENCOUNTER — Inpatient Hospital Stay (HOSPITAL_COMMUNITY): Payer: Medicaid Other

## 2019-07-08 NOTE — Telephone Encounter (Signed)
CSW texted patient around 11:15am to see if he needed a ride to his appt at 1:30 today- no response.  At around 1pm CSW called pt to inquire about coming to appt and no answer- unable to leave VM  Mark [name], [facility] Desk#: [phone] Cell#: [phone]

## 2019-07-08 NOTE — Telephone Encounter (Signed)
Pt got back to CSW to inform that he was unaware of appt today and would like to reschedule.  CSW spoke to scheduler who states next available is Monday at 2pm- pt agreeable and will need transport- we will plan on a taxi to get pt for appt.  CSW also informed pt case manager through [facility] [phone]) so she can assist in keeping patient compliant.  CSW will continue to follow and assist as needed  Mark [name], [facility] Desk#: [phone] Cell#: [phone]

## 2019-07-10 ENCOUNTER — Telehealth (HOSPITAL_COMMUNITY): Payer: Self-pay | Admitting: Cardiology

## 2019-07-10 NOTE — Telephone Encounter (Signed)
Mark [name] with [facility]  Called to report patient has been non compliant with medication/lab follow ups.   Of note patient has frequently no-showed outpatient appts. Will make note for upcoming appt

## 2019-07-10 NOTE — Telephone Encounter (Signed)
Since pt is not taking the warfarin and has an appt with HF Clinic left a message again for them to call back and confirm they got this message and to inform the MD on his appt on 07/15/2019; if he resumes we will need to know so we can start following the pt again.

## 2019-07-10 NOTE — Telephone Encounter (Signed)
Chantel from [facility] called back and stated she got the message that was left earlier today and will inform the provider as pt has a visit on 07/15/2019.

## 2019-07-12 ENCOUNTER — Telehealth (HOSPITAL_COMMUNITY): Payer: Self-pay

## 2019-07-12 ENCOUNTER — Telehealth (HOSPITAL_COMMUNITY): Payer: Self-pay | Admitting: Licensed Clinical Social Worker

## 2019-07-12 NOTE — Telephone Encounter (Signed)
I called Mr. Roots to see if he was available for a CHP visit.  He hasn't been answering my calls this week; the phone rings but the voicemail isn't "set up".  I will f/u with him next week.

## 2019-07-12 NOTE — Telephone Encounter (Signed)
CSW received call from pt hospice that pt is having trouble getting medical records from [facility] which he needs to appeal disability.  CSW called [employer] and left message for pt [employer] Case worker so I could send clinicals directly to them- awaiting return call  CSW updated pt who states he lawyer is also requesting them- asked pt to give lawyer my number so that he could send me a release and I could provide clinicals to their office  CSW will continue to follow and assist as needed  Jorge Ny, [facility] Desk#: [phone] Cell#: [phone]

## 2019-07-12 NOTE — Telephone Encounter (Signed)
CSW received call back from pt lawyer- they were able to send over a release from the patient for medical records and CSW confirmed with patient that he is agreeable to records being sent to his lawyer. Sent over most recent clinic notes to assist with disability appeal.  CSW will continue to follow and assist as needed  Jorge Ny, [place] Worker [facility] Desk#: [phone] Cell#: [phone]

## 2019-07-15 ENCOUNTER — Other Ambulatory Visit (HOSPITAL_COMMUNITY): Payer: Self-pay

## 2019-07-15 ENCOUNTER — Other Ambulatory Visit: Payer: Self-pay

## 2019-07-15 ENCOUNTER — Telehealth (HOSPITAL_COMMUNITY): Payer: Self-pay | Admitting: Licensed Clinical Social Worker

## 2019-07-15 ENCOUNTER — Encounter (HOSPITAL_COMMUNITY): Payer: Self-pay

## 2019-07-15 ENCOUNTER — Ambulatory Visit (HOSPITAL_COMMUNITY)
Admission: RE | Admit: 2019-07-15 | Discharge: 2019-07-15 | Disposition: A | Discharge status: Home / Self Care | Payer: Medicaid Other | Source: Ambulatory Visit | Attending: Cardiology | Admitting: Cardiology

## 2019-07-15 VITALS — BP 102/80 | HR 112 | Wt 144.6 lb

## 2019-07-15 DIAGNOSIS — I5022 Chronic systolic (congestive) heart failure: Secondary | ICD-10-CM | POA: Diagnosis not present

## 2019-07-15 DIAGNOSIS — Z86718 Personal history of other venous thrombosis and embolism: Secondary | ICD-10-CM | POA: Insufficient documentation

## 2019-07-15 DIAGNOSIS — J449 Chronic obstructive pulmonary disease, unspecified: Secondary | ICD-10-CM

## 2019-07-15 DIAGNOSIS — Z7901 Long term (current) use of anticoagulants: Secondary | ICD-10-CM | POA: Diagnosis not present

## 2019-07-15 DIAGNOSIS — Z885 Allergy status to narcotic agent status: Secondary | ICD-10-CM | POA: Insufficient documentation

## 2019-07-15 DIAGNOSIS — Z79899 Other long term (current) drug therapy: Secondary | ICD-10-CM | POA: Diagnosis not present

## 2019-07-15 DIAGNOSIS — I428 Other cardiomyopathies: Secondary | ICD-10-CM | POA: Diagnosis not present

## 2019-07-15 DIAGNOSIS — Z882 Allergy status to sulfonamides status: Secondary | ICD-10-CM | POA: Diagnosis not present

## 2019-07-15 DIAGNOSIS — Z8249 Family history of ischemic heart disease and other diseases of the circulatory system: Secondary | ICD-10-CM | POA: Diagnosis not present

## 2019-07-15 DIAGNOSIS — Z8673 Personal history of transient ischemic attack (TIA), and cerebral infarction without residual deficits: Secondary | ICD-10-CM | POA: Diagnosis not present

## 2019-07-15 DIAGNOSIS — Z888 Allergy status to other drugs, medicaments and biological substances status: Secondary | ICD-10-CM | POA: Insufficient documentation

## 2019-07-15 DIAGNOSIS — I5082 Biventricular heart failure: Secondary | ICD-10-CM | POA: Diagnosis not present

## 2019-07-15 DIAGNOSIS — Z87891 Personal history of nicotine dependence: Secondary | ICD-10-CM | POA: Diagnosis not present

## 2019-07-15 DIAGNOSIS — N182 Chronic kidney disease, stage 2 (mild): Secondary | ICD-10-CM | POA: Insufficient documentation

## 2019-07-15 DIAGNOSIS — Z881 Allergy status to other antibiotic agents status: Secondary | ICD-10-CM | POA: Diagnosis not present

## 2019-07-15 DIAGNOSIS — Z880 Allergy status to penicillin: Secondary | ICD-10-CM | POA: Insufficient documentation

## 2019-07-15 MED ORDER — ALBUTEROL SULFATE HFA 108 (90 BASE) MCG/ACT IN AERS: 1.0000 | INHALATION_SPRAY | RESPIRATORY_TRACT | 3 refills | Status: DC | PRN

## 2019-07-15 MED FILL — ALBUTEROL SULFATE HFA 108 (: 108 (90 BAS | 16 days supply | Qty: 9 | Fill #0

## 2019-07-15 NOTE — Telephone Encounter (Signed)
CSW received email from pt law office stating that Social Security needs to speak with the pt ASAP.He needs to call Social Security at [phone] to attest to the application that the law office filed on his behalf AND he needs to talk to them about an SSI claim. When he calls, he needs to let them know he is a Terminal Illness case so that they will expedite it.   CSW sent patient a message informing him of this- he confirmed he understands and will reach out to Social Security to complete.  Pt also has an appt today- has missed the last 2 appts post hospital stay so it is very important for him to come to this check up.  CSW able to confirm with pt that he is aware of this appt and is still able to come.  CSW confirmed with scheduler that the taxi has been called for this pt to pick him up around 1:30pm  CSW will continue to follow and assist as needed  Jorge Ny, [facility] Desk#: [phone] Cell#: [phone]

## 2019-07-15 NOTE — Progress Notes (Signed)
Advanced Heart Failure Clinic Note   Referring Physician: PCP: Kerin Perna, NP PCP-Cardiologist: Lauree Chandler, MD  [facility]: Dr. Aundra Dubin   HPI:  Mr Mallari is a 43 year old severe diffuse hypokinesis5/2020,NICM by cardiac cath 2017, history of right MCA CVA 2019, asthma, andpolysubstance use. Lastecho from 5/20 showed that EF is markedly low at 10% with LV apical thrombus. The RV is severely dysfunctional as well. RHC in 8/20 with low output and high filling pressures, he was on milrinone during 8/20 admission but insisted on going home forafuneral.   He was readmitted 9/16/20with recurrent cardiogenic shock.Co-ox was in the 30s. He was placed on milrinone and diuresed with IV lasix. Once diuresed he was transitioned to lasix 80 mg daily. Overall diuresed 9 pounds. Milrinone was turned off because he wanted to go home and get his affairs in order. He was referred to Palliative Care and understood he has limited time. He was not ready to transition to Hospice but was agreeable to outpatient Palliative Care.   Readmitted 06/09/19 with recurrent A/C biventricular heart failure/cardiogenic shock and possible PNA. PNA treated with antibiotic course. Placed on milrinone and diuresed with IV lasix. During his hospitalization he refused IV lasix 4 times. He wanted to pursue LVAD work up but was ultimately deemed to high risk for VAD at [facility] due to RV failure, poor PFTs, and social barriers. Discussed referral to [facility] for second opinion but he was adamant he wanted to go home today without milrinone. Milrinone was stopped and PICC was removed.Discharged 06/18/19.   On 06/19/19 he presented to [facility] via EMS with increased shortness of breath.  Due to increased WOB he was placed Bipap. Potassium 7, creatinine 1.8, BNP 2045, and lactic acid 8. Hyperkalemia treated in the ED. He was able to tell me he wanted everything done and would want to go to [facility] if he they would consider VAD.   Placed on milrinone 0.375 mcg and diuresed with IV lasix with adequate response. Milrinone increased to 0.5 mcg due to low mixed venous saturation. Once diuresed he was transitioned to torsemide 60 mg daily.   Dr Haroldine Laws personally called Dr Mosetta Pigeon at [facility] and personally discussed his case. He was not thought to be candidate for LVAD at [facility] due to RV failure, poor PFTs, and social barriers. After discussion with Cayden he wanted to d/c home on milrinone. We discussed that milrinone will not make him live longer but will be for palliation. He was agreeable and verbalized understanding the risks of discharging home with PICC/milrinone. Referred to [facility] for home milrinone. [facility] helping w/ milrinone and [facility] provides Hospice services. He was discharged with single lumen PICC.    Presents to clinic for f/u. Looks fairly well and reports that he has been feeling better w/ milrinone. No readmissions since last discharge. He still has occasional dyspnea at home that improves w/ PRN albuterol nebulizer treatments. Appeitite is good. No weight gain. No supplemental O2 requirements. Denies fever and chills. He is appreciative of the services provided by palliative care and home health team. He is looking forward to spending the holidays w/ his family. He is still on coumadin. He has not had his INR checked in over a month but denies abnormal bleeding. Was previously followed at the [facility] coumadin clinic. Home health will return to his home again tomorrow.     Review of Systems: [y] = yes, [ ]  = no   General: Weight gain [ ] ; Weight loss [ ] ;  Anorexia [ ] ; Fatigue [ ] ; Fever [ ] ; Chills [ ] ; Weakness [ ]   Cardiac: Chest pain/pressure [ ] ; Resting SOB [ ] ; Exertional SOB [ ] ; Orthopnea [ ] ; Pedal Edema [ ] ; Palpitations [ ] ; Syncope [ ] ; Presyncope [ ] ; Paroxysmal nocturnal dyspnea[ ]   Pulmonary: Cough [ ] ; Wheezing[ ] ; Hemoptysis[ ] ; Sputum [ ] ; Snoring [ ]   GI: Vomiting[ ] ; Dysphagia[ ] ;  Melena[ ] ; Hematochezia [ ] ; Heartburn[ ] ; Abdominal pain [ ] ; Constipation [ ] ; Diarrhea [ ] ; BRBPR [ ]   GU: Hematuria[ ] ; Dysuria [ ] ; Nocturia[ ]   Vascular: Pain in legs with walking [ ] ; Pain in feet with lying flat [ ] ; Non-healing sores [ ] ; Stroke [ ] ; TIA [ ] ; Slurred speech [ ] ;  Neuro: Headaches[ ] ; Vertigo[ ] ; Seizures[ ] ; Paresthesias[ ] ;Blurred vision [ ] ; Diplopia [ ] ; Vision changes [ ]   Ortho/Skin: Arthritis [ ] ; Joint pain [ ] ; Muscle pain [ ] ; Joint swelling [ ] ; Back Pain [ ] ; Rash [ ]   Psych: Depression[ ] ; Anxiety[ ]   Heme: Bleeding problems [ ] ; Clotting disorders [ ] ; Anemia [ ]   Endocrine: Diabetes [ ] ; Thyroid dysfunction[ ]    Past Medical History:  Diagnosis Date  . Asthma   . Chronic systolic CHF (congestive heart failure) (HCC)   . Cigarette smoker   . CKD (chronic kidney disease), stage II    [name]/notes 10/01/2017  . COPD (chronic obstructive pulmonary disease) (HCC) 10/21/2017   on CT scan chest  . History of echocardiogram    a. Echo 5/17 - EF 20-25%, severe diff HK, restrictive physiology, mild to mod MR, severe reduced RVSF, mod RVE, mild RAE, mod TR, PASP 48 mmHg  . Hx of cardiac cath    a. LHC 5/17 - normal coronary arteries. PA 45/25, mean 33, PCWP mean 18  . NICM (nonischemic cardiomyopathy) (HCC)   . Stroke (HCC) 09/27/2017   "was weak on my left side; I'm fully recovered" (11/09/2017)  . Substance abuse (HCC)    cocaine, marijuana    Current Outpatient Medications  Medication Sig Dispense Refill  . digoxin (LANOXIN) 0.125 MG tablet Take 1 tablet (0.125 mg total) by mouth daily. 30 tablet 5  . milrinone (PRIMACOR) 20 MG/100 ML SOLN infusion Inject 0.0332 mg/min into the vein continuous. [facility] 100 mL 52  . Multiple Vitamin (MULTIVITAMIN WITH MINERALS) TABS tablet Take 1 tablet by mouth daily.    . potassium chloride SA (KLOR-CON) 20 MEQ tablet Take 1 tablet (20 mEq total) by mouth daily. 60 tablet 0  . torsemide (DEMADEX) 20 MG tablet Take  3 tablets (60 mg total) by mouth daily. 90 tablet 6  . warfarin (COUMADIN) 5 MG tablet 5 mg daily except 2.5 mg on Tuesday 30 tablet 1  . albuterol (VENTOLIN HFA) 108 (90 Base) MCG/ACT inhaler Inhale 1-2 puffs into the lungs every 4 (four) hours as needed for wheezing or shortness of breath. 8.5 g 3   No current facility-administered medications for this encounter.     Allergies  Allergen Reactions  . Bactrim [Sulfamethoxazole-Trimethoprim] Swelling and Rash    Facial swelling  . Clindamycin/Lincomycin Swelling and Palpitations  . Hydrocodone Hives  . Lasix [Furosemide] Hives and Swelling    Facial swelling Reaction to name brand LASIX  . Lisinopril Swelling and Other (See Comments)    Facial swelling/angioedema  . Olopatadine Hcl Swelling    Makes his face swell up  . Penicillins Hives and Swelling  Has patient had a PCN reaction causing immediate rash, facial/tongue/throat swelling, SOB or lightheadedness with hypotension: Yes Has patient had a PCN reaction causing severe rash involving mucus membranes or skin necrosis: No Has patient had a PCN reaction that required hospitalization: No Has patient had a PCN reaction occurring within the last 10 years: No If all of the above answers are "NO", then may proceed with Cephalosporin use.   . Prednisone Shortness Of Breath, Nausea Only, Swelling and Other (See Comments)    Also made chest feel tight and genital area, legs, and face became swollen badly      Social History   Socioeconomic History  . Marital status: Divorced    Spouse name: Not on file  . Number of children: Not on file  . Years of education: Not on file  . Highest education level: Not on file  Occupational History  . Not on file  Social Needs  . Financial resource strain: Somewhat hard  . Food insecurity    Worry: Sometimes true    Inability: Sometimes true  . Transportation needs    Medical: No    Non-medical: No  Tobacco Use  . Smoking status:  Former Smoker    Packs/day: 1.00    Years: 30.00    Pack years: 30.00    Types: Cigarettes    Quit date: 09/27/2017    Years since quitting: 1.7  . Smokeless tobacco: Never Used  Substance and Sexual Activity  . Alcohol use: Yes    Alcohol/week: 5.0 standard drinks    Types: 5 Shots of liquor per week    Frequency: Never    Comment: 5-6 shots of vodka daily; "stopped it all after I had stroke 09/27/2017"  . Drug use: Yes    Types: Cocaine, Marijuana    Comment: 11/09/2017 "none since 09/27/2017"  . Sexual activity: Not Currently  Lifestyle  . Physical activity    Days per week: 0 days    Minutes per session: 0 min  . Stress: To some extent  Relationships  . Social connections    Talks on phone: More than three times a week    Gets together: More than three times a week    Attends religious service: 1 to 4 times per year    Active member of club or organization: No    Attends meetings of clubs or organizations: Never    Relationship status: Divorced  . Intimate partner violence    Fear of current or ex partner: No    Emotionally abused: No    Physically abused: No    Forced sexual activity: No  Other Topics Concern  . Not on file  Social History Narrative  . Not on file      Family History  Problem Relation Age of Onset  . Cardiomyopathy Father        Reports his father has an LVAD  . Heart failure Father   . Hypertension Father   . Heart disease Maternal Grandmother        had a whole in her heart  . Deep vein thrombosis Neg Hx     Vitals:   07/15/19 1353  BP: 102/80  Pulse: (!) 112  SpO2: 98%  Weight: 65.6 kg (144 lb 9.6 oz)     PHYSICAL EXAM: General:  Chronically fatigue appearing, thin AAM. No respiratory difficulty HEENT: normal Neck: supple. no JVD. Carotids 2+ bilat; no bruits. No lymphadenopathy or thyromegaly appreciated. Cor: PMI nondisplaced. Regular rate &  rhythm. No rubs, gallops or murmurs. Lungs: clear Abdomen: soft, nontender,  nondistended. No hepatosplenomegaly. No bruits or masses. Good bowel sounds. Extremities: no cyanosis, clubbing, rash, edema. RUE PICC  Neuro: alert & oriented x 3, cranial nerves grossly intact. moves all 4 extremities w/o difficulty. Affect pleasant.  ECG: not performed    ASSESSMENT & PLAN:  1. Cardiogenic Shock due to a/c Biventricular systolic HF - ECHO LV 20-25% RV severely reduced.  - not a candidate for transplant due to h/o drug abuse. He was not thought to be candidate for LVAD at [facility] due to RV failure, poor PFTs, and social barriers.  - Continue home milrinone for palliation. Home Health assisting w/ home milrinone. Also w/ home hospice services for comfort care.     2. Apical Thrombus  - On coumadin but lost to f/u in coumadin clinic. He denies abnormal bleeding. Will have HH RN check INR tomorrow to ensure level is not supra therapetuic. Advised to report value to coumadin clinic.   3. H/O MCA CVA 2019   Robbie Lis, [place] 07/15/19

## 2019-07-16 ENCOUNTER — Other Ambulatory Visit (HOSPITAL_COMMUNITY): Payer: Self-pay

## 2019-07-16 NOTE — Progress Notes (Signed)
Mr. Markie hasn't answered my calls and has been intermitted with responding text with Tammy Sours LCSW at the [facility].  He was sitting on the porch when I got there.  He was waiting for the RN to come and change the dressing to his port.  She came during our visit, so we agreed that I will f/u with him tomorrow.  He has no new complaints today.

## 2019-07-23 ENCOUNTER — Emergency Department: Payer: Self-pay

## 2019-07-23 ENCOUNTER — Observation Stay (HOSPITAL_COMMUNITY)
Admission: EM | Admit: 2019-07-23 | Discharge: 2019-07-24 | Disposition: A | Discharge status: Home / Self Care | Payer: Medicaid Other | Attending: Internal Medicine | Admitting: Internal Medicine

## 2019-07-23 ENCOUNTER — Encounter (HOSPITAL_COMMUNITY): Payer: Self-pay | Admitting: Emergency Medicine

## 2019-07-23 ENCOUNTER — Emergency Department (HOSPITAL_COMMUNITY): Payer: Medicaid Other

## 2019-07-23 DIAGNOSIS — Z20828 Contact with and (suspected) exposure to other viral communicable diseases: Secondary | ICD-10-CM | POA: Diagnosis not present

## 2019-07-23 DIAGNOSIS — Z79899 Other long term (current) drug therapy: Secondary | ICD-10-CM | POA: Diagnosis not present

## 2019-07-23 DIAGNOSIS — Z66 Do not resuscitate: Secondary | ICD-10-CM | POA: Diagnosis not present

## 2019-07-23 DIAGNOSIS — I5042 Chronic combined systolic (congestive) and diastolic (congestive) heart failure: Secondary | ICD-10-CM | POA: Diagnosis present

## 2019-07-23 DIAGNOSIS — D631 Anemia in chronic kidney disease: Secondary | ICD-10-CM | POA: Insufficient documentation

## 2019-07-23 DIAGNOSIS — Z452 Encounter for adjustment and management of vascular access device: Principal | ICD-10-CM | POA: Insufficient documentation

## 2019-07-23 DIAGNOSIS — I420 Dilated cardiomyopathy: Secondary | ICD-10-CM | POA: Insufficient documentation

## 2019-07-23 DIAGNOSIS — Z95828 Presence of other vascular implants and grafts: Secondary | ICD-10-CM

## 2019-07-23 DIAGNOSIS — Z8673 Personal history of transient ischemic attack (TIA), and cerebral infarction without residual deficits: Secondary | ICD-10-CM | POA: Insufficient documentation

## 2019-07-23 DIAGNOSIS — Z87891 Personal history of nicotine dependence: Secondary | ICD-10-CM | POA: Diagnosis not present

## 2019-07-23 DIAGNOSIS — Z7901 Long term (current) use of anticoagulants: Secondary | ICD-10-CM | POA: Diagnosis not present

## 2019-07-23 DIAGNOSIS — N182 Chronic kidney disease, stage 2 (mild): Secondary | ICD-10-CM | POA: Diagnosis not present

## 2019-07-23 DIAGNOSIS — J449 Chronic obstructive pulmonary disease, unspecified: Secondary | ICD-10-CM | POA: Insufficient documentation

## 2019-07-23 DIAGNOSIS — Z515 Encounter for palliative care: Secondary | ICD-10-CM | POA: Diagnosis not present

## 2019-07-23 DIAGNOSIS — I428 Other cardiomyopathies: Secondary | ICD-10-CM

## 2019-07-23 DIAGNOSIS — I5023 Acute on chronic systolic (congestive) heart failure: Secondary | ICD-10-CM | POA: Diagnosis present

## 2019-07-23 LAB — CBC
HCT: 41.8 % (ref 39.0–52.0)
Hemoglobin: 12.6 g/dL — ABNORMAL LOW (ref 13.0–17.0)
MCH: 25.4 pg — ABNORMAL LOW (ref 26.0–34.0)
MCHC: 30.1 g/dL (ref 30.0–36.0)
MCV: 84.3 fL (ref 80.0–100.0)
Platelets: 189 10*3/uL (ref 150–400)
RBC: 4.96 MIL/uL (ref 4.22–5.81)
RDW: 17 % — ABNORMAL HIGH (ref 11.5–15.5)
WBC: 5.7 10*3/uL (ref 4.0–10.5)
nRBC: 0 % (ref 0.0–0.2)

## 2019-07-23 LAB — PROTIME-INR
INR: 1.4 — ABNORMAL HIGH (ref 0.8–1.2)
Prothrombin Time: 16.6 seconds — ABNORMAL HIGH (ref 11.4–15.2)

## 2019-07-23 LAB — BASIC METABOLIC PANEL
Anion gap: 11 (ref 5–15)
BUN: 17 mg/dL (ref 6–20)
CO2: 31 mmol/L (ref 22–32)
Calcium: 9.4 mg/dL (ref 8.9–10.3)
Chloride: 92 mmol/L — ABNORMAL LOW (ref 98–111)
Creatinine, Ser: 1.36 mg/dL — ABNORMAL HIGH (ref 0.61–1.24)
GFR calc Af Amer: >60 mL/min (ref >60)
GFR calc non Af Amer: >60 mL/min (ref >60)
Glucose, Bld: 112 mg/dL — ABNORMAL HIGH (ref 70–99)
Potassium: 4.8 mmol/L (ref 3.5–5.1)
Sodium: 134 mmol/L — ABNORMAL LOW (ref 135–145)

## 2019-07-23 MED ORDER — ALBUTEROL SULFATE HFA 108 (90 BASE) MCG/ACT IN AERS: 1.0000 | INHALATION_SPRAY | RESPIRATORY_TRACT | Status: DC | PRN

## 2019-07-23 MED ORDER — MILRINONE LACTATE IN DEXTROSE 20-5 MG/100ML-% IV SOLN
0.5000 ug/kg/min | INTRAVENOUS | Status: DC
  Administered 2019-07-23 – 2019-07-24 (×2): 0.5 ug/kg/min via INTRAVENOUS
  Filled 2019-07-23 (×4): qty 100

## 2019-07-23 MED ORDER — ADULT MULTIVITAMIN W/MINERALS CH: 1.0000 | ORAL_TABLET | Freq: Every day | ORAL | Status: DC

## 2019-07-23 MED ORDER — ALBUTEROL SULFATE HFA 108 (90 BASE) MCG/ACT IN AERS
6.0000 | INHALATION_SPRAY | Freq: Once | RESPIRATORY_TRACT | Status: AC
  Administered 2019-07-24: 6 via RESPIRATORY_TRACT
  Filled 2019-07-23: qty 6.7

## 2019-07-23 NOTE — ED Provider Notes (Addendum)
[name]   CSN: [id number] Arrival date & time: 07/23/19  1708     History   Chief Complaint No chief complaint on file.   HPI Mark ScottJeremie C Sligh is a 43 y.o. 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  Reviewed patient's PMH and most recent echo demonstrates LVEF 20-25% with global hypokinesis.  He has been recently admitted several times in the past couple months for his congestive heart failure.  Patient reports that the nurse was removing the tape from his PICC line when the PICC line came out.  He felt perfectly fine otherwise and denies any other symptoms or complaints.  His only concern is that he has not been getting his milrinone infusion.     HPI  Past Medical History:  Diagnosis Date  . Asthma   . Chronic systolic CHF (congestive heart failure) (HCC)   . Cigarette smoker   . CKD (chronic kidney disease), stage II    [name]/notes 10/01/2017  . COPD (chronic obstructive pulmonary disease) (HCC) 10/21/2017   on CT scan chest  . History of echocardiogram    a. Echo 5/17 - EF 20-25%, severe diff HK, restrictive physiology, mild to mod MR, severe reduced RVSF, mod RVE, mild RAE, mod TR, PASP 48 mmHg  . Hx of cardiac cath    a. LHC 5/17 - normal coronary arteries. PA 45/25, mean 33, PCWP mean 18  . NICM (nonischemic cardiomyopathy) (HCC)   . Stroke (HCC) 09/27/2017   "was weak on my left side; I'm fully recovered" (11/09/2017)  . Substance abuse (HCC)    cocaine, marijuana    Patient Active Problem List   Diagnosis Date Noted  . Palliative care by specialist   . Acute kidney injury (nontraumatic) (HCC)   . Right middle lobe pneumonia 06/09/2019  . Polysubstance abuse (HCC) 06/09/2019  . Chronic hyponatremia 06/09/2019  . Malnutrition of  moderate degree 04/10/2019  . Cardiogenic shock (HCC) 04/09/2019  . Allergic reaction caused by a drug 02/14/2019  . Abnormal liver function 02/14/2019  . Encounter for therapeutic drug monitoring 02/13/2019  . Goals of care, counseling/discussion 02/13/2019  . 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 Acute CHF (congestive heart failure) (HCC) 01/24/2019  . Shortness of breath   . Cocaine use   . Acute on chronic systolic (congestive) heart failure (HCC) 12/25/2018  . Demand ischemia (HCC)   . LV (left ventricular) mural thrombus without MI (HCC)   . Noncompliance   . Chronic anemia   . Acute systolic CHF (congestive heart failure) (HCC) 11/09/2017  . History of right MCA stroke 09/28/2017  . Stroke (HCC) 09/28/2017  . Chronic combined systolic and diastolic congestive heart failure (HCC) 01/11/2016  . NICM (nonischemic cardiomyopathy) (HCC) 01/11/2016  . CKD (chronic kidney disease) stage 2, GFR 60-89 ml/min 01/11/2016  . Cocaine abuse (HCC) 01/11/2016  . Chest pain, pleuritic 01/03/2016  . Tobacco abuse 01/03/2016  . Asthma 01/03/2016  . Leg swelling 01/03/2016  . Tachycardia 01/03/2016  . Normocytic anemia 01/03/2016  . Elevated troponin I level 01/03/2016  . Right rib fracture 01/03/2016  . Chest pain     Past Surgical History:  Procedure Laterality Date  . CARDIAC CATHETERIZATION N/A 01/05/2016   Procedure: Right/Left Heart Cath and Coronary Angiography;  Surgeon: Lennette Biharihomas A Kelly,  MD;  Location: [facility];  Service: Cardiovascular;  Laterality: N/A;  . MULTIPLE EXTRACTIONS WITH ALVEOLOPLASTY Bilateral 04/11/2019   Procedure: Extraction of tooth #'s 2, 4, 5, 10, and 12 with alveoloplasty;  Surgeon: Lenn Cal, DDS;  Location: [facility];  Service: Oral Surgery;  Laterality: Bilateral;  . RIGHT HEART CATH N/A 04/09/2019   Procedure: RIGHT HEART CATH;  Surgeon: Jolaine Artist, MD;  Location: [facility];  Service: Cardiovascular;  Laterality: N/A;  . RIGHT  HEART CATH N/A 06/17/2019   Procedure: RIGHT HEART CATH;  Surgeon: Jolaine Artist, MD;  Location: [facility];  Service: Cardiovascular;  Laterality: N/A;        Home Medications    Prior to Admission medications   Medication Sig Start Date End Date Taking? Authorizing Provider  albuterol (VENTOLIN HFA) 108 (90 Base) MCG/ACT inhaler Inhale 1-2 puffs into the lungs every 4 (four) hours as needed for wheezing or shortness of breath. 07/15/19   Lyda Jester M, PA-C  digoxin (LANOXIN) 0.125 MG tablet Take 1 tablet (0.125 mg total) by mouth daily. 12/30/18   Raiford Noble Latif, DO  milrinone (PRIMACOR) 20 MG/100 ML SOLN infusion Inject 0.0332 mg/min into the vein continuous. Per [place] 06/25/19   Darrick Grinder D, NP  Multiple Vitamin (MULTIVITAMIN WITH MINERALS) TABS tablet Take 1 tablet by mouth daily.    [provider]  potassium chloride SA (KLOR-CON) 20 MEQ tablet Take 1 tablet (20 mEq total) by mouth daily. 06/25/19   Clegg, Amy D, NP  torsemide (DEMADEX) 20 MG tablet Take 3 tablets (60 mg total) by mouth daily. 06/25/19   Darrick Grinder D, NP  warfarin (COUMADIN) 5 MG tablet 5 mg daily except 2.5 mg on Tuesday 05/13/19   Darrick Grinder D, NP    Family History Family History  Problem Relation Age of Onset  . Cardiomyopathy Father        Reports his father has an LVAD  . Heart failure Father   . Hypertension Father   . Heart disease Maternal Grandmother        had a whole in her heart  . Deep vein thrombosis Neg Hx     Social History Social History   Tobacco Use  . Smoking status: Former Smoker    Packs/day: 1.00    Years: 30.00    Pack years: 30.00    Types: Cigarettes    Quit date: 09/27/2017    Years since quitting: 1.8  . Smokeless tobacco: Never Used  Substance Use Topics  . Alcohol use: Yes    Alcohol/week: 5.0 standard drinks    Types: 5 Shots of liquor per week    Frequency: Never    Comment: 5-6 shots of vodka daily; "stopped it all after I  had stroke 09/27/2017"  . Drug use: Yes    Types: Cocaine, Marijuana    Comment: 11/09/2017 "none since 09/27/2017"     Allergies   Bactrim [sulfamethoxazole-trimethoprim], Clindamycin/lincomycin, Hydrocodone, Lasix [furosemide], Lisinopril, Olopatadine hcl, Penicillins, and Prednisone   Review of Systems Review of Systems  All other systems reviewed and are negative.    Physical Exam Updated Vital Signs BP (!) 137/96   Pulse (!) 123   Temp 98 F (36.7 C) (Oral)   Resp (!) 24   SpO2 98%   Physical Exam Vitals signs and nursing note reviewed. Exam conducted with a chaperone present.  Constitutional:      Appearance: Normal appearance.  HENT:  Head: Normocephalic and atraumatic.  Eyes:     General: No scleral icterus.    Conjunctiva/sclera: Conjunctivae normal.  Cardiovascular:     Rate and Rhythm: Normal rate and regular rhythm.     Pulses: Normal pulses.  Pulmonary:     Comments: Mildly increased work of breathing.  Supplemental oxygen provided.  No significant wheezing bilaterally.  No accessory muscle use. Abdominal:     General: Abdomen is flat. There is no distension.     Palpations: Abdomen is soft.     Tenderness: There is no abdominal tenderness. There is no guarding.  Skin:    General: Skin is dry.  Neurological:     Mental Status: He is alert and oriented to person, place, and time.     GCS: GCS eye subscore is 4. GCS verbal subscore is 5. GCS motor subscore is 6.  Psychiatric:        Mood and Affect: Mood normal.        Behavior: Behavior normal.        Thought Content: Thought content normal.      ED Treatments / Results  Labs (all labs ordered are listed, but only abnormal results are displayed) Labs Reviewed  BASIC METABOLIC PANEL - Abnormal; Notable for the following components:      Result Value   Sodium 134 (*)    Chloride 92 (*)    Glucose, Bld 112 (*)    Creatinine, Ser 1.36 (*)    All other components within normal limits  CBC -  Abnormal; Notable for the following components:   Hemoglobin 12.6 (*)    MCH 25.4 (*)    RDW 17.0 (*)    All other components within normal limits  PROTIME-INR - Abnormal; Notable for the following components:   Prothrombin Time 16.6 (*)    INR 1.4 (*)    All other components within normal limits  SARS CORONAVIRUS 2 (TAT 6-24 HRS)  POC SARS CORONAVIRUS 2 AG -  ED    EKG None  Radiology Dg Chest Portable 1 View  Result Date: 07/23/2019 CLINICAL DATA:  Shortness of breath EXAM: PORTABLE CHEST 1 VIEW COMPARISON:  06/19/2019 FINDINGS: There is hyperinflation of the lungs compatible with COPD. Cardiomegaly. No confluent opacities or effusions. No acute bony abnormality. IMPRESSION: COPD, cardiomegaly.  No active disease. Electronically Signed   By: Charlett Nose M.D.   On: 07/23/2019 23:48   [place] Ekg Site Rite  Result Date: 07/23/2019 If Site Rite image not attached, placement could not be confirmed due to current cardiac rhythm.   Procedures Procedures (including critical care time)  Medications Ordered in ED Medications  milrinone (PRIMACOR) 20 MG/100 ML (0.2 mg/mL) infusion (0.5 mcg/kg/min  66.4 kg Intravenous New Bag/Given 07/23/19 1954)  albuterol (VENTOLIN HFA) 108 (90 Base) MCG/ACT inhaler 1-2 puff (has no administration in time range)  multivitamin with minerals tablet 1 tablet (has no administration in time range)  albuterol (VENTOLIN HFA) 108 (90 Base) MCG/ACT inhaler 6 puff (6 puffs Inhalation Given 07/24/19 0002)     Initial Impression / Assessment and Plan / ED Course  I have reviewed the triage vital signs and the nursing notes.  Pertinent labs & imaging results that were available during my care of the patient were reviewed by me and considered in my medical decision making (see chart for details).        Patient placed on supplemental oxygen given mildly increased respiratory effort.  On reexamination, patient is saturating 100% on  4 L nasal cannula and is  neither tachypneic, nor tachycardic.  Patient feeling improved after receiving milrinone infusion as his infusion had been interrupted due to accidental PICC removal.   Patient will require PICC line placement and we are unable to do that here for him tonight.  He will need to be admitted until PICC line can be placed.  COVID-19 testing obtained.  Albuterol has been ordered in the event that he develops any wheezing.  Obtained basic labs by femoral stick. CBC demonstrates a hemoglobin that is significantly improved from 4 weeks ago.  His BMP demonstrates a mild increase in creatinine to 1.36, but no other significant findings. Placed order for EKG.  DG Chest portable demonstrates COPD and cardiomegaly but no acute cardiopulmonary disease.   Consulted [facility] hospitalist Dr. Debby Bud for admission until PICC can be placed and he can be discharged.   On reexamination, patient is continuing to complain of increased respiratory effort.  He informs me that this is what happens when his PICC line is pulled or his milrinone infusion is ceased for whatever reason.  While he endorses improvement in symptoms since his milrinone infusion restarted through peripheral line, he still is requesting breathing treatments. Offered patient albuterol treatment but he refused. He may require BiPAP, pending assessment by Dr. Debby Bud. While tachycardic and tachypneic, we have low suspicion at this time for pulmonary embolism at this time given that he reports this current increased respiratory effort has occurred before when infusion was compromised.  They are also baseline for him when compared to prior notes given his heart failure. Will obtain rapid POC COVID-19 testing and if negative may be able to begin BiPAP.  Dr. Estell Harpin personally evaluated patient and agrees with current plan. Dr. Debby Bud will soon evaluate patient.   12:19 AM Patient agreed to 6 puffs albuterol and is already feeling a bit improved. Respiratory effort appears  improved, as well. Continues to oxygenate well on 4 L.    Final Clinical Impressions(s) / ED Diagnoses   Final diagnoses:  Needs peripherally inserted central catheter (PICC)    ED Discharge Orders    None       Lorelee New, PA-C 07/24/19 0019    Lorelee New, PA-C 07/24/19 9470    Bethann Berkshire, MD 07/24/19 2301

## 2019-07-23 NOTE — ED Notes (Signed)
Lab draw attempted x's 2 without success

## 2019-07-23 NOTE — ED Triage Notes (Signed)
Pt BIB [facility], was changing his PICC line and it came out. Is taking continuous meds through the line but it has been stopped for 30 mins since it came out.

## 2019-07-23 NOTE — ED Provider Notes (Signed)
Aspiration of blood/fluid  Date/Time: 07/23/2019 9:34 PM Performed by: Milton Ferguson, MD Authorized by: Milton Ferguson, MD  Comments: Patient had a femoral stick done to draw blood.  Right femoral vein was located without difficulty.  Area was cleaned thoroughly with antiseptic.  No lidocaine was used for anesthetizing patient.  An 18-gauge needle was inserted into the right femoral vein and 20 cc of blood was aspirated.  Patient tolerated the procedure well         Milton Ferguson, MD 07/23/19 2135

## 2019-07-24 ENCOUNTER — Observation Stay (HOSPITAL_COMMUNITY): Payer: Medicaid Other

## 2019-07-24 ENCOUNTER — Telehealth (HOSPITAL_COMMUNITY): Payer: Self-pay | Admitting: Licensed Clinical Social Worker

## 2019-07-24 DIAGNOSIS — I5023 Acute on chronic systolic (congestive) heart failure: Secondary | ICD-10-CM | POA: Diagnosis present

## 2019-07-24 DIAGNOSIS — Z452 Encounter for adjustment and management of vascular access device: Secondary | ICD-10-CM | POA: Diagnosis not present

## 2019-07-24 DIAGNOSIS — I5042 Chronic combined systolic (congestive) and diastolic (congestive) heart failure: Secondary | ICD-10-CM

## 2019-07-24 DIAGNOSIS — I428 Other cardiomyopathies: Secondary | ICD-10-CM

## 2019-07-24 DIAGNOSIS — N182 Chronic kidney disease, stage 2 (mild): Secondary | ICD-10-CM

## 2019-07-24 LAB — POC SARS CORONAVIRUS 2 AG -  ED: SARS Coronavirus 2 Ag: NEGATIVE

## 2019-07-24 LAB — SARS CORONAVIRUS 2 (TAT 6-24 HRS): SARS Coronavirus 2: NEGATIVE

## 2019-07-24 MED ORDER — SODIUM CHLORIDE 0.9% FLUSH: 10.0000 mL | Freq: Two times a day (BID) | INTRAVENOUS | Status: DC

## 2019-07-24 MED ORDER — SODIUM CHLORIDE 0.45 % IV SOLN: INTRAVENOUS | Status: DC

## 2019-07-24 MED ORDER — SODIUM CHLORIDE 0.9% FLUSH: 10.0000 mL | INTRAVENOUS | Status: DC | PRN

## 2019-07-24 MED ORDER — CHLORHEXIDINE GLUCONATE CLOTH 2 % EX PADS: 6.0000 | MEDICATED_PAD | Freq: Every day | CUTANEOUS | Status: DC

## 2019-07-24 MED ORDER — ONDANSETRON HCL 4 MG/2ML IJ SOLN: 4.0000 mg | Freq: Once | INTRAMUSCULAR | Status: DC

## 2019-07-24 MED ORDER — DIPHENHYDRAMINE HCL 50 MG/ML IJ SOLN
12.5000 mg | Freq: Once | INTRAMUSCULAR | Status: AC
  Administered 2019-07-24: 12.5 mg via INTRAVENOUS

## 2019-07-24 MED ORDER — TORSEMIDE 20 MG PO TABS: 60.0000 mg | ORAL_TABLET | Freq: Every day | ORAL | Status: DC

## 2019-07-24 MED ORDER — WARFARIN SODIUM 5 MG PO TABS
5.0000 mg | ORAL_TABLET | Freq: Every day | ORAL | Status: DC
  Filled 2019-07-24: qty 1

## 2019-07-24 MED ORDER — ACETAMINOPHEN 325 MG PO TABS: 650.0000 mg | ORAL_TABLET | Freq: Four times a day (QID) | ORAL | Status: DC | PRN

## 2019-07-24 MED ORDER — DIGOXIN 125 MCG PO TABS: 0.1250 mg | ORAL_TABLET | Freq: Every day | ORAL | Status: DC

## 2019-07-24 MED ORDER — WARFARIN - PHYSICIAN DOSING INPATIENT: Freq: Every day | Status: DC

## 2019-07-24 MED ORDER — ACETAMINOPHEN 650 MG RE SUPP: 650.0000 mg | Freq: Four times a day (QID) | RECTAL | Status: DC | PRN

## 2019-07-24 MED ORDER — POTASSIUM CHLORIDE CRYS ER 20 MEQ PO TBCR: 20.0000 meq | EXTENDED_RELEASE_TABLET | Freq: Every day | ORAL | Status: DC

## 2019-07-24 NOTE — ED Notes (Signed)
Dr. Eliseo Squires here to eval pt.  Pt up to BR without gross need for (A) to ambulate.  Orders for PICC line changed to STAT, Team called by MD.

## 2019-07-24 NOTE — Progress Notes (Signed)
Peripherally Inserted Central Catheter/Midline Placement  The IV Nurse has discussed with the patient and/or persons authorized to consent for the patient, the purpose of this procedure and the potential benefits and risks involved with this procedure.  The benefits include less needle sticks, lab draws from the catheter, and the patient may be discharged home with the catheter. Risks include, but not limited to, infection, bleeding, blood clot (thrombus formation), and puncture of an artery; nerve damage and irregular heartbeat and possibility to perform a PICC exchange if needed/ordered by physician.  Alternatives to this procedure were also discussed.  B[employer] Power PICC patient education guide, fact sheet on infection prevention and patient information card has been provided to patient /or left at bedside.    PICC/Midline Placement Documentation  PICC Single Lumen 06/21/19 Brachial 41 cm 0 cm (Active)     PICC Single Lumen 07/24/19 PICC Right Brachial 40 cm 1 cm (Active)  Indication for Insertion or Continuance of Line Vasoactive infusions 07/24/19 1025  Exposed Catheter (cm) 1 cm 07/24/19 1025  Site Assessment Clean;Dry;Intact 07/24/19 1025  Line Status Flushed;No blood return 07/24/19 1025  Dressing Type Transparent 07/24/19 1025  Dressing Status Clean;Dry;Intact;Antimicrobial disc in place;Other (Comment) 07/24/19 1025  Dressing Intervention New dressing 07/24/19 1025  Dressing Change Due 07/31/19 07/24/19 1025       Christella Noa Albarece 07/24/2019, 10:27 AM

## 2019-07-24 NOTE — ED Notes (Signed)
Continued  Tachypnea and tachycardia, denies any further nausea.  PICC line team called, pt PICC line placement good with xray verification.  OK to use and DC peripheral line.

## 2019-07-24 NOTE — ED Provider Notes (Signed)
RN Coffey asked me to evaluate patient.  He is currently here in the ED awaiting PICC line replacement.  Has been persistently tachycardic and tachypneic since last night.  History of severe CHF on milrinone infusion.  He began vomiting while getting his PICC line placed.  Complaining of pruritic rash and "welts".  Denies difficulty breathing or swallowing, facial swelling.  RN noted O2 sats dropped to 93% on room air so he was placed on supplemental oxygen with improvement.  He denies any shortness of breath or chest pain to me.  No abdominal pain.  He tells me symptoms began after eating breakfast and drinking grape juice.  He has multiple medication allergies. Physical Exam  BP 106/78 (BP Location: Left Arm)   Pulse (!) 120   Temp 98 F (36.7 C) (Oral)   Resp (!) 24   SpO2 98%   Physical Exam Vitals signs and nursing note reviewed.  Constitutional:      General: He is not in acute distress.    Appearance: He is well-developed.  HENT:     Head: Normocephalic and atraumatic.     Mouth/Throat:     Comments: No swelling of the lips or tongue.  No uvular edema or periorbital edema.  Tolerating secretions without difficulty.  No abnormal phonation. Eyes:     General:        Right eye: No discharge.        Left eye: No discharge.     Conjunctiva/sclera: Conjunctivae normal.  Neck:     Vascular: No JVD.     Trachea: No tracheal deviation.  Cardiovascular:     Rate and Rhythm: Tachycardia present.  Pulmonary:     Effort: Pulmonary effort is normal.     Comments: Tachypneic, speaking in full sentences, SPO2 saturations 98% on 2 L nasal cannula Abdominal:     General: There is no distension.  Skin:    Findings: No erythema.     Comments: Scattered raised lesions to the upper and lower extremities and abdomen.  Excoriations noted.  Nikolsky sign absent.  Neurological:     Mental Status: He is alert.  Psychiatric:        Behavior: Behavior normal.     ED Course/Procedures      Procedures  MDM  Airway is intact.  Does not appear to be in any respiratory distress, currently at baseline.  He tells me he feels better after vomiting and denies any nausea at this point.  Dr. Eliseo Squires has been notified and will manage patient.        Renita Papa, PA-C 07/24/19 1448    Maudie Flakes, MD 07/26/19 [phone]

## 2019-07-24 NOTE — ED Notes (Signed)
Tele   Breakfast ordered  

## 2019-07-24 NOTE — H&P (Signed)
History and Physical    Mark [name] ZOX:[id number] DOB: 10/02/1975 DOA: 07/23/2019  PCP: Grayce SessionsEdwards, Mark P, NP (Confirm with patient/family/NH records and if not entered, [facility]) Patient coming from: Patient is coming from home  I have personally briefly reviewed patient's old medical records [facility]  Chief Complaint: Loss of PICC line  HPI: Mark [name] is a 43 y.o. male with medical history significant of  chronic biventricular heart failure with an ejection fraction of 10% at one time with most recent EF of 20 to 25%.  Also revealed global hypokinesis, marked enlargement of the left atrium and right atrium.  Patient carries a diagnosis of a dilated cardiomyopathy.  He has a history of CVA, polysubstance abuse, and CKD.  He is followed by the heart failure clinic.  Last 6 months has had several hospitalizations for acute on chronic heart failure including shock and cardiovascular collapse.  Patient had been resuscitated with IV milrinone and IV Lasix.  Has been stabilized and able to convert to oral diuretic.  He has had several trials of remaining home off milrinone and failed.  Patient is not considered a candidate [facility] or [facility] secondary to right heart failure history of substance abuse social barriers.  He has excepted palliative care  and is also followed at home by home health to manage his IV milrinone .  Mark [name] presents to the ED via EMS after his PICC line came out during a dressing change interrupting his milrinone infusion.    Over the course of several hours off milrinone he developed increasing shortness of breath with tachypnea and increased work of breathing.  Descriptors: Location, Quality, Severity, Duration, Timing, Context, modifying factors, associated signs/symptoms and/or status of 3+ chronic problems.)  (Please avoid self-populating past  medical history here) (The initial 2-3 lines should be focused and good to copy and paste in the HPI section of the daily progress note).  ED Course: Patient's blood pressure was stable in the emergency department.  An IV access was gained and milrinone infusion was restarted.  Patient did have some increasing shortness of breath and was put to oxygen at 4 L with a subsequent O2 saturation of 99%.  He did develop some increased work of breathing along with some mild agitation was asking to be put on BiPAP.  Reassurance was provided, several puffs of albuterol were administered the patient symptoms improved.  TRH was consulted for admission and observation status so that PICC line can be restarted.  Review of Systems: As per HPI otherwise 10 point review of systems negative.    Past Medical History:  Diagnosis Date   Asthma    Chronic systolic CHF (congestive heart failure) (HCC)    Cigarette smoker    CKD (chronic kidney disease), stage II    /notes 10/01/2017   COPD (chronic obstructive pulmonary disease) (HCC) 10/21/2017   on CT scan chest   History of echocardiogram    a. Echo 5/17 - EF 20-25%, severe diff HK, restrictive physiology, mild to mod MR, severe reduced RVSF, mod RVE, mild RAE, mod TR, PASP 48 mmHg   Hx of cardiac cath    a. LHC 5/17 - normal coronary arteries. PA 45/25, mean 33, PCWP mean 18   NICM (nonischemic cardiomyopathy) (HCC)    Stroke (HCC) 09/27/2017   "was weak on my left side; I'm fully  recovered" (11/09/2017)   Substance abuse (HCC)    cocaine, marijuana    Past Surgical History:  Procedure Laterality Date   CARDIAC CATHETERIZATION N/A 01/05/2016   Procedure: Right/Left Heart Cath and Coronary Angiography;  Surgeon: Lennette Bihari, MD;  Location: [facility] INVASIVE CV LAB;  Service: Cardiovascular;  Laterality: N/A;   MULTIPLE EXTRACTIONS WITH ALVEOLOPLASTY Bilateral 04/11/2019   Procedure: Extraction of tooth #'s 2, 4, 5, 10, and 12 with alveoloplasty;   Surgeon: Charlynne Pander, DDS;  Location: [facility] OR;  Service: Oral Surgery;  Laterality: Bilateral;   RIGHT HEART CATH N/A 04/09/2019   Procedure: RIGHT HEART CATH;  Surgeon: Dolores Patty, MD;  Location: [facility] INVASIVE CV LAB;  Service: Cardiovascular;  Laterality: N/A;   RIGHT HEART CATH N/A 06/17/2019   Procedure: RIGHT HEART CATH;  Surgeon: Dolores Patty, MD;  Location: [facility] INVASIVE CV LAB;  Service: Cardiovascular;  Laterality: N/A;   Social history -patient is been married 3 times currently separated.  He has 2 children.  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  Currently he is unable to work and is on disability.  He does have someone who lives with him is able to assist with his ADLs.  He does have family in town.   reports that he quit smoking about 21 months ago. His smoking use included cigarettes. He has a 30.00 pack-year smoking history. He has never used smokeless tobacco. He reports current alcohol use of about 5.0 standard drinks of alcohol per week. He reports current drug use. Drugs: Cocaine and Marijuana.  Allergies  Allergen Reactions   Bactrim [Sulfamethoxazole-Trimethoprim] Swelling and Rash    Facial swelling   Clindamycin/Lincomycin Swelling and Palpitations   Hydrocodone Hives   Lasix [Furosemide] Hives and Swelling    Facial swelling Reaction to name brand LASIX   Lisinopril Swelling and Other (See Comments)    Facial swelling/angioedema   Olopatadine Hcl Swelling    Makes his face swell up   Penicillins Hives and Swelling     Has patient had a PCN reaction causing immediate rash, facial/tongue/throat swelling, SOB or lightheadedness with hypotension: Yes Has patient had a PCN reaction causing severe rash involving mucus membranes or skin necrosis: No Has patient had a PCN reaction that required hospitalization: No Has patient had a PCN reaction occurring within the last 10 years: No If all of the above answers  are "NO", then may proceed with Cephalosporin use.    Prednisone Shortness Of Breath, Nausea Only, Swelling and Other (See Comments)    Also made chest feel tight and genital area, legs, and face became swollen badly    Family History  Problem Relation Age of Onset   Cardiomyopathy Father        Reports his father has an LVAD   Heart failure Father    Hypertension Father    Heart disease Maternal Grandmother        had a whole in her heart   Deep vein thrombosis Neg Hx    Unacceptable: Noncontributory, unremarkable, or negative. Acceptable: Family history reviewed and not pertinent (If you reviewed it)  Prior to Admission medications   Medication Sig Start Date End Date Taking? Authorizing Provider  albuterol (VENTOLIN HFA) 108 (90 Base) MCG/ACT inhaler Inhale 1-2 puffs into the lungs every 4 (four) hours as needed for wheezing or shortness of breath. 07/15/19   Robbie Lis M, PA-C  digoxin (LANOXIN) 0.125 MG tablet Take 1 tablet (0.125 mg  total) by mouth daily. 12/30/18   Raiford Noble Latif, DO  milrinone (PRIMACOR) 20 MG/100 ML SOLN infusion Inject 0.0332 mg/min into the vein continuous. Per [place] 06/25/19   Darrick Grinder D, NP  Multiple Vitamin (MULTIVITAMIN WITH MINERALS) TABS tablet Take 1 tablet by mouth daily.    [provider]  potassium chloride SA (KLOR-CON) 20 MEQ tablet Take 1 tablet (20 mEq total) by mouth daily. 06/25/19   Clegg, Amy D, NP  torsemide (DEMADEX) 20 MG tablet Take 3 tablets (60 mg total) by mouth daily. 06/25/19   Darrick Grinder D, NP  warfarin (COUMADIN) 5 MG tablet 5 mg daily except 2.5 mg on Tuesday 05/13/19   Darrick Grinder D, NP    Physical Exam: Vitals:   07/23/19 2015 07/23/19 2030 07/23/19 2345 07/24/19 0000  BP: (!) 103/91 106/81 130/87 (!) 137/96  Pulse: (!) 125 (!) 124 (!) 125 (!) 123  Resp: (!) 28 (!) 24 (!) 23 (!) 24  Temp:      TempSrc:      SpO2: 100% 97% 100% 98%    Constitutional: NAD, calm, comfortable Vitals:    07/23/19 2015 07/23/19 2030 07/23/19 2345 07/24/19 0000  BP: (!) 103/91 106/81 130/87 (!) 137/96  Pulse: (!) 125 (!) 124 (!) 125 (!) 123  Resp: (!) 28 (!) 24 (!) 23 (!) 24  Temp:      TempSrc:      SpO2: 100% 97% 100% 98%   General appearance : Slender man who is somewhat anxious but in no acute distress.   Eyes: PERRL, lids and conjunctivae normal ENMT: Mucous membranes are moist. Posterior pharynx clear of any exudate or lesions.Normal dentition.  Neck: normal, supple, no masses, no thyromegaly Respiratory: clear to auscultation bilaterally, no wheezing, no crackles. Normal respiratory effort at the time of my exam. No accessory muscle use.  Cardiovascular: Regular tachycardia, no murmurs / rubs / gallops. No extremity edema. 2+ pedal pulses. No carotid bruits.  Abdomen: no tenderness, no masses palpated. No hepatosplenomegaly. Bowel sounds positive.  Musculoskeletal: no clubbing / cyanosis. No joint deformity upper and lower extremities. Good ROM, no contractures. Normal muscle tone.  Skin: no rashes, lesions, ulcers. No induration.  Much body art Neurologic: CN 2-12 grossly intact. Sensation intact, .  Psychiatric: Normal judgment and insight. Alert and oriented x 3. Normal mood.   (Anything < 9 systems with 2 bullets each down codes to level 1) (If patient refuses exam cant bill higher level) (Make sure to document decubitus ulcers present on admission -- if possible -- and whether patient has chronic indwelling catheter at time of admission)  Labs on Admission: I have personally reviewed following labs and imaging studies  CBC: Recent Labs  Lab 07/23/19 1927  WBC 5.7  HGB 12.6*  HCT 41.8  MCV 84.3  PLT 160   Basic Metabolic Panel: Recent Labs  Lab 07/23/19 1927  NA 134*  K 4.8  CL 92*  CO2 31  GLUCOSE 112*  BUN 17  CREATININE 1.36*  CALCIUM 9.4   GFR: Estimated Creatinine Clearance: 63.6 mL/min (A) (by C-G formula based on SCr of 1.36 mg/dL (H)). Liver  Function Tests: No results for input(s): AST, ALT, ALKPHOS, BILITOT, PROT, ALBUMIN in the last 168 hours. No results for input(s): LIPASE, AMYLASE in the last 168 hours. No results for input(s): AMMONIA in the last 168 hours. Coagulation Profile: Recent Labs  Lab 07/23/19 1927  INR 1.4*   Cardiac Enzymes: No results for input(s): CKTOTAL, CKMB,  CKMBINDEX, TROPONINI in the last 168 hours. BNP (last 3 results) No results for input(s): PROBNP in the last 8760 hours. HbA1C: No results for input(s): HGBA1C in the last 72 hours. CBG: No results for input(s): GLUCAP in the last 168 hours. Lipid Profile: No results for input(s): CHOL, HDL, LDLCALC, TRIG, CHOLHDL, LDLDIRECT in the last 72 hours. Thyroid Function Tests: No results for input(s): TSH, T4TOTAL, FREET4, T3FREE, THYROIDAB in the last 72 hours. Anemia Panel: No results for input(s): VITAMINB12, FOLATE, FERRITIN, TIBC, IRON, RETICCTPCT in the last 72 hours. Urine analysis:    Component Value Date/Time   COLORURINE AMBER (A) 06/09/2019 1559   APPEARANCEUR CLEAR 06/09/2019 1559   LABSPEC 1.016 06/09/2019 1559   PHURINE 6.0 06/09/2019 1559   GLUCOSEU NEGATIVE 06/09/2019 1559   HGBUR NEGATIVE 06/09/2019 1559   BILIRUBINUR SMALL (A) 06/09/2019 1559   BILIRUBINUR negative 12/01/2017 0910   KETONESUR NEGATIVE 06/09/2019 1559   PROTEINUR NEGATIVE 06/09/2019 1559   UROBILINOGEN 2.0 (A) 12/01/2017 0910   NITRITE NEGATIVE 06/09/2019 1559   LEUKOCYTESUR TRACE (A) 06/09/2019 1559    Radiological Exams on Admission: Dg Chest Portable 1 View  Result Date: 07/23/2019 CLINICAL DATA:  Shortness of breath EXAM: PORTABLE CHEST 1 VIEW COMPARISON:  06/19/2019 FINDINGS: There is hyperinflation of the lungs compatible with COPD. Cardiomegaly. No confluent opacities or effusions. No acute bony abnormality. IMPRESSION: COPD, cardiomegaly.  No active disease. Electronically Signed   By: Charlett Nose M.D.   On: 07/23/2019 23:48   [place] Ekg Site  Rite  Result Date: 07/23/2019 If Site Rite image not attached, placement could not be confirmed due to current cardiac rhythm.   EKG: Independently reviewed.  Sinus tachycardia, left anterior fascicular block, LVH.  Assessment/Plan Active Problems:   Chronic combined systolic and diastolic congestive heart failure (HCC)   NICM (nonischemic cardiomyopathy) (HCC)   CKD (chronic kidney disease) stage 2, GFR 60-89 ml/min   Acute on chronic systolic heart failure (HCC)  (please populate well all problems here in Problem List. (For example, if patient is on BP meds at home and you resume or decide to hold them, it is a problem that needs to be her. Same for CAD, COPD, HLD and so on)   1.  Combined systolic diastolic heart failure -patient has been doing relatively well on home milrinone.  Seen in the heart failure clinic on 07/15/2019.  Patient is followed by palliative care. Plan IV team to restart PICC line  Continue patient's current home medical regimen  Follow-up in the heart failure clinic as directed  DVT prophylaxis: Warfarin (Lovenox/Heparin/SCD's/anticoagulated/None (if comfort care) Code Status: DNR (Full/Partial (specify details) Family Communication: no family to contact at this hour (Specify name, relationship. Do not write "discussed with patient". Specify tel # if discussed over the phone) Disposition Plan: home 24 hours (specify when and where you expect patient to be discharged) Consults called: none (with names) Admission status: obs-tele (inpatient / obs / tele / medical floor / SDU)   Illene Regulus MD [facility] Pager [phone]  If 7PM-7AM, please contact night-coverage [email or web address] Password [facility]  07/24/2019, 12:42 AM

## 2019-07-24 NOTE — Telephone Encounter (Signed)
CSW received message from pt lawyer inquiring about pt progress with social security.  CSW touched base with pt who believes he had applied for SSDI back in November yet lawyer is stating pt needs to apply and to attest that the law office is involved in his case.  Pt can't remember if he called Social Security last week when directed by CSW- CSW attempted to assist pt in calling Social Security but was unable to get through after 4 attempts due to high volume of calls  csw will attempt again tomorrow with patient.  Jorge Ny, LCSW Clinical Social Worker [facility] Desk#: [phone] Cell#: [phone]

## 2019-07-24 NOTE — ED Notes (Signed)
Pt had vomiting during PICC line placement, now generalized with welt like hives with pruritis.  Md paged.

## 2019-07-24 NOTE — Discharge Summary (Signed)
Physician Discharge Summary  Mark [name] SWN:[id number] DOB: 1975-08-25 DOA: 07/23/2019  PCP: Mark Sessions, NP  Admit date: 07/23/2019 Discharge date: 07/24/2019  Admitted From: home Discharge disposition: home   Recommendations for Outpatient Follow-Up:   1. Resume palliative care 2. PICC line placed   Discharge Diagnosis:   Active Problems:   Chronic combined systolic and diastolic congestive heart failure (HCC)   NICM (nonischemic cardiomyopathy) (HCC)   CKD (chronic kidney disease) stage 2, GFR 60-89 ml/min   Acute on chronic systolic heart failure (HCC)    Discharge Condition: Improved.  Diet recommendation: Low sodium, heart healthy  Wound care: None.  Code status: Full.   History of Present Illness:   Mark [name] is a 43 y.o. male with medical history significant of  chronic biventricular heart failurewith an ejection fraction of 10% at one time with most recent EF of 20 to 25%.  Also revealed global hypokinesis, marked enlargement of the left atrium and right atrium.  Patient carries a diagnosis of a dilated cardiomyopathy.  He has a history of CVA, polysubstance abuse,and CKD.  He is followed by the heart failure clinic.  Last 6 months has had several hospitalizations for acute on chronic heart failure including shock and cardiovascular collapse.  Patient had been resuscitated with IV milrinone and IV Lasix.  Has been stabilized and able to convert to oral diuretic.  He has had several trials of remaining home off milrinone and failed.  Patient is not considered a candidate for left ventricular assist device either at [facility] or at [facility] secondary to right heart failure history of substance abuse social barriers.  He has excepted palliative care and is also followed at home by home health to manage his IV milrinone .  Mark [name] presents to the ED via EMS after his PICC line came out during a dressing change  interrupting his milrinone infusion.   Over the course of several hours off milrinone he developed increasing shortness of breath with tachypnea and increased work of breathing.   Hospital Course by Problem:   Combined systolic diastolic heart failure  -patient has been doing relatively well on home milrinone.   -Seen in the heart failure clinic on 07/15/2019.   -Patient is followed by palliative care. -PICC line accidentally fell out with dressing change:  IV team to restart PICC line - Continue patient's current home medical regimen                 Medical Consultants:      Discharge Exam:   Vitals:   07/24/19 0015 07/24/19 0443  BP: 109/80 106/78  Pulse:  (!) 120  Resp: (!) 25 (!) 24  Temp:    SpO2:  98%   Vitals:   07/23/19 2345 07/24/19 0000 07/24/19 0015 07/24/19 0443  BP: 130/87 (!) 137/96 109/80 106/78  Pulse: (!) 125 (!) 123  (!) 120  Resp: (!) 23 (!) 24 (!) 25 (!) 24  Temp:      TempSrc:      SpO2: 100% 98%  98%    General exam: Appears calm and comfortable, walking to the bathroom with ease   The results of significant diagnostics from this hospitalization (including imaging, microbiology, ancillary and laboratory) are listed below for reference.     Procedures and Diagnostic Studies:   Dg Chest Portable 1 View  Result Date: 07/23/2019 CLINICAL DATA:  Shortness of breath EXAM: PORTABLE CHEST 1 VIEW  COMPARISON:  06/19/2019 FINDINGS: There is hyperinflation of the lungs compatible with COPD. Cardiomegaly. No confluent opacities or effusions. No acute bony abnormality. IMPRESSION: COPD, cardiomegaly.  No active disease. Electronically Signed   By: Charlett Nose M.D.   On: 07/23/2019 23:48   [place] Ekg Site Rite  Result Date: 07/23/2019 If Site Rite image not attached, placement could not be confirmed due to current cardiac rhythm.    Labs:   Basic Metabolic Panel: Recent Labs  Lab 07/23/19 1927  NA 134*  K 4.8  CL 92*  CO2 31  GLUCOSE 112*  BUN  17  CREATININE 1.36*  CALCIUM 9.4   GFR Estimated Creatinine Clearance: 63.6 mL/min (A) (by C-G formula based on SCr of 1.36 mg/dL (H)). Liver Function Tests: No results for input(s): AST, ALT, ALKPHOS, BILITOT, PROT, ALBUMIN in the last 168 hours. No results for input(s): LIPASE, AMYLASE in the last 168 hours. No results for input(s): AMMONIA in the last 168 hours. Coagulation profile Recent Labs  Lab 07/23/19 1927  INR 1.4*    CBC: Recent Labs  Lab 07/23/19 1927  WBC 5.7  HGB 12.6*  HCT 41.8  MCV 84.3  PLT 189   Cardiac Enzymes: No results for input(s): CKTOTAL, CKMB, CKMBINDEX, TROPONINI in the last 168 hours. BNP: Invalid input(s): POCBNP CBG: No results for input(s): GLUCAP in the last 168 hours. D-Dimer No results for input(s): DDIMER in the last 72 hours. Hgb A1c No results for input(s): HGBA1C in the last 72 hours. Lipid Profile No results for input(s): CHOL, HDL, LDLCALC, TRIG, CHOLHDL, LDLDIRECT in the last 72 hours. Thyroid function studies No results for input(s): TSH, T4TOTAL, T3FREE, THYROIDAB in the last 72 hours.  Invalid input(s): FREET3 Anemia work up No results for input(s): VITAMINB12, FOLATE, FERRITIN, TIBC, IRON, RETICCTPCT in the last 72 hours. Microbiology No results found for this or any previous visit (from the past 240 hour(s)).   Discharge Instructions:   Discharge Instructions    Diet - low sodium heart healthy   Complete by: As directed    Discharge instructions   Complete by: As directed    Resume palliative care   Increase activity slowly   Complete by: As directed      Allergies as of 07/24/2019      Reactions   Bactrim [sulfamethoxazole-trimethoprim] Swelling, Rash   Facial swelling   Clindamycin/lincomycin Swelling, Palpitations   Hydrocodone Hives   Lasix [furosemide] Hives, Swelling   Facial swelling Reaction to name brand LASIX   Lisinopril Swelling, Other (See Comments)   Facial swelling/angioedema    Olopatadine Hcl Swelling   Makes his face swell up   Penicillins Hives, Swelling   Has patient had a PCN reaction causing immediate rash, facial/tongue/throat swelling, SOB or lightheadedness with hypotension: Yes Has patient had a PCN reaction causing severe rash involving mucus membranes or skin necrosis: No Has patient had a PCN reaction that required hospitalization: No Has patient had a PCN reaction occurring within the last 10 years: No If all of the above answers are "NO", then may proceed with Cephalosporin use.   Prednisone Shortness Of Breath, Nausea Only, Swelling, Other (See Comments)   Also made chest feel tight and genital area, legs, and face became swollen badly      Medication List    STOP taking these medications   furosemide 80 MG tablet Commonly known as: LASIX   spironolactone 25 MG tablet Commonly known as: ALDACTONE     TAKE these medications  albuterol 108 (90 Base) MCG/ACT inhaler Commonly known as: VENTOLIN HFA Inhale 1-2 puffs into the lungs every 4 (four) hours as needed for wheezing or shortness of breath.   albuterol (2.5 MG/3ML) 0.083% nebulizer solution Commonly known as: PROVENTIL Take 2.5 mg by nebulization every 4 (four) hours as needed for wheezing.   digoxin 0.125 MG tablet Commonly known as: LANOXIN Take 1 tablet (0.125 mg total) by mouth daily.   Milrinone Lactate 50 MG/50ML Soln Inject 0.5 mcg/kg/min into the vein continuous. Weight 65.5 kg Pump rate 2 ml/hr   multivitamin with minerals Tabs tablet Take 1 tablet by mouth daily.   potassium chloride SA 20 MEQ tablet Commonly known as: KLOR-CON Take 1 tablet (20 mEq total) by mouth daily.   torsemide 20 MG tablet Commonly known as: DEMADEX Take 3 tablets (60 mg total) by mouth daily.   warfarin 5 MG tablet Commonly known as: COUMADIN Take as directed. If you are unsure how to take this medication, talk to your nurse or doctor. Original instructions: 5 mg daily except 2.5 mg  on Tuesday What changed:   how much to take  how to take this  when to take this  additional instructions         Time coordinating discharge: 25 min  Signed:  Geradine Girt DO  [facility] 07/24/2019, 9:20 AM

## 2019-07-24 NOTE — ED Notes (Signed)
Pt regurgitated. Linens and gown changed.

## 2019-07-24 NOTE — ED Notes (Signed)
Contacted by pt. Pt stated that he was having difficulty breathing, itching, and urticaria. Informed Selinda Eon - RN.

## 2019-07-30 ENCOUNTER — Telehealth (HOSPITAL_COMMUNITY): Payer: Self-pay | Admitting: Licensed Clinical Social Worker

## 2019-07-30 NOTE — Telephone Encounter (Signed)
CSW was able to get through to [facility] and informed that pt will need to speak with his case worker directly to attest to his disability application and file an SSI claim.  Pt Case worker name is Ms. Armando Gang number is [phone] left message  CSW updated pt and will continue to follow and assist  Jorge Ny, [facility] Desk#: [phone] Cell#: [phone]

## 2019-07-31 ENCOUNTER — Other Ambulatory Visit (HOSPITAL_COMMUNITY): Payer: Self-pay

## 2019-07-31 NOTE — Progress Notes (Signed)
Paramedicine Encounter    Patient ID: Mark [name], male    DOB: [birth year], 43 y.o.   MRN: [id number]    Patient Care Team: Kerin Perna, NP as PCP - General (Internal Medicine) Burnell Blanks, MD as PCP - Cardiology (Cardiology) Bensimhon, Shaune Pascal, MD as PCP - Advanced Heart Failure (Cardiology)  Patient Active Problem List   Diagnosis Date Noted  . Acute on chronic systolic heart failure ([place]) 07/24/2019  . Palliative care by specialist   . Acute kidney injury (nontraumatic) ([place])   . Right middle lobe pneumonia 06/09/2019  . Polysubstance abuse ([place]) 06/09/2019  . Chronic hyponatremia 06/09/2019  . Malnutrition of moderate degree 04/10/2019  . Cardiogenic shock ([place]) 04/09/2019  . Allergic reaction caused by a drug 02/14/2019  . Abnormal liver function 02/14/2019  . Encounter for therapeutic drug monitoring 02/13/2019  . Goals of care, counseling/discussion 02/13/2019  . 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 Acute CHF (congestive heart failure) ([place]) 01/24/2019  . Shortness of breath   . Cocaine use   . Acute on chronic systolic (congestive) heart failure ([place]) 12/25/2018  . Demand ischemia ([place])   . LV (left ventricular) mural thrombus without MI ([place])   . Noncompliance   . Chronic anemia   . Acute systolic CHF (congestive heart failure) ([place]) 11/09/2017  . History of right MCA stroke 09/28/2017  . Stroke ([place]) 09/28/2017  . Chronic combined systolic and diastolic congestive heart failure ([place]) 01/11/2016  . NICM (nonischemic cardiomyopathy) ([place]) 01/11/2016  . CKD (chronic kidney disease) stage 2, GFR 60-89 ml/min 01/11/2016  . Cocaine abuse ([place]) 01/11/2016  . Chest pain, pleuritic 01/03/2016  . Tobacco abuse 01/03/2016  . Asthma 01/03/2016  . Leg swelling 01/03/2016  . Tachycardia 01/03/2016  . Normocytic anemia 01/03/2016  . Elevated troponin I level 01/03/2016  . Right rib fracture 01/03/2016  . Chest pain     Current Outpatient  Medications:  .  albuterol (PROVENTIL) (2.5 MG/3ML) 0.083% nebulizer solution, Take 2.5 mg by nebulization every 4 (four) hours as needed for wheezing. , Disp: , Rfl:  .  albuterol (VENTOLIN HFA) 108 (90 Base) MCG/ACT inhaler, Inhale 1-2 puffs into the lungs every 4 (four) hours as needed for wheezing or shortness of breath., Disp: 8.5 g, Rfl: 3 .  digoxin (LANOXIN) 0.125 MG tablet, Take 1 tablet (0.125 mg total) by mouth daily., Disp: 30 tablet, Rfl: 5 .  Milrinone Lactate 50 MG/50ML SOLN, Inject 0.5 mcg/kg/min into the vein continuous. Weight 65.5 kg Pump rate 2 ml/hr, Disp: , Rfl:  .  Multiple Vitamin (MULTIVITAMIN WITH MINERALS) TABS tablet, Take 1 tablet by mouth daily., Disp: , Rfl:  .  potassium chloride SA (KLOR-CON) 20 MEQ tablet, Take 1 tablet (20 mEq total) by mouth daily., Disp: 60 tablet, Rfl: 0 .  torsemide (DEMADEX) 20 MG tablet, Take 3 tablets (60 mg total) by mouth daily., Disp: 90 tablet, Rfl: 6 .  warfarin (COUMADIN) 5 MG tablet, 5 mg daily except 2.5 mg on Tuesday (Patient taking differently: Take 2.5-5 mg by mouth See admin instructions. Take 1/2 tablet on Tuesday then take 1 tablet all the other days), Disp: 30 tablet, Rfl: 1 Allergies  Allergen Reactions  . Bactrim [Sulfamethoxazole-Trimethoprim] Swelling and Rash    Facial swelling  . Clindamycin/Lincomycin Swelling and Palpitations  . Hydrocodone Hives  . Lasix [Furosemide] Hives and Swelling    Facial swelling Reaction to name brand LASIX  . Lisinopril Swelling and Other (See Comments)    Facial  swelling/angioedema  . Olopatadine Hcl Swelling    Makes his face swell up  . Penicillins Hives and Swelling     Has patient had a PCN reaction causing immediate rash, facial/tongue/throat swelling, SOB or lightheadedness with hypotension: Yes Has patient had a PCN reaction causing severe rash involving mucus membranes or skin necrosis: No Has patient had a PCN reaction that required hospitalization: No Has patient had a  PCN reaction occurring within the last 10 years: No If all of the above answers are "NO", then may proceed with Cephalosporin use.   . Prednisone Shortness Of Breath, Nausea Only, Swelling and Other (See Comments)    Also made chest feel tight and genital area, legs, and face became swollen badly     Social History   Socioeconomic History  . Marital status: Divorced    Spouse name: Not on file  . Number of children: Not on file  . Years of education: Not on file  . Highest education level: Not on file  Occupational History  . Not on file  Social Needs  . Financial resource strain: Somewhat hard  . Food insecurity    Worry: Sometimes true    Inability: Sometimes true  . Transportation needs    Medical: No    Non-medical: No  Tobacco Use  . Smoking status: Former Smoker    Packs/day: 1.00    Years: 30.00    Pack years: 30.00    Types: Cigarettes    Quit date: 09/27/2017    Years since quitting: 1.8  . Smokeless tobacco: Never Used  Substance and Sexual Activity  . Alcohol use: Yes    Alcohol/week: 5.0 standard drinks    Types: 5 Shots of liquor per week    Frequency: Never    Comment: 5-6 shots of vodka daily; "stopped it all after I had stroke 09/27/2017"  . Drug use: Yes    Types: Cocaine, Marijuana    Comment: 11/09/2017 "none since 09/27/2017"  . Sexual activity: Not Currently  Lifestyle  . Physical activity    Days per week: 0 days    Minutes per session: 0 min  . Stress: To some extent  Relationships  . Social connections    Talks on phone: More than three times a week    Gets together: More than three times a week    Attends religious service: 1 to 4 times per year    Active member of club or organization: No    Attends meetings of clubs or organizations: Never    Relationship status: Divorced  . Intimate partner violence    Fear of current or ex partner: No    Emotionally abused: No    Physically abused: No    Forced sexual activity: No  Other Topics  Concern  . Not on file  Social History Narrative  . Not on file    Physical Exam Pulmonary:     Effort: Pulmonary effort is normal.     Breath sounds: No wheezing or rales.  Skin:    General: Skin is warm and dry.         Future Appointments  Date Time Provider Department Center  09/09/2019  2:00 PM [facility] PA/NP [facility] None     There were no vitals taken for this visit.  Weight yesterday-didn't weigh Last visit weight-doesn't have a weight noted  ATF pt CAO x4 sitting in the living room. He said that the RN just left from changing the dressing to his picline.  He said that he's been tired and sleeping a lot.  He denies sob, chest pain and dizziness.  He's out of digoxin, spirolactone and warfarin.  He said that his RN at hospice told him that "hospice doesn't pay for his medications and he can't afford them".  He hasn't weighed; he doesn't have a reason why.    I verified with Desiree that she's given Mark [name] the number that he needs to get his medications from the pharmacy.  The medications is free for him and he just need to pick them up.  I tried calling him about the same but there's no answer.    Medication ordered: He said that the refills are at the pharmacy but he can't afford them  [facility] Keifer Habib, EMT Paramedic [phone] 07/31/2019    ACTION: Home visit completed

## 2019-08-05 ENCOUNTER — Telehealth (HOSPITAL_COMMUNITY): Payer: Self-pay | Admitting: Licensed Clinical Social Worker

## 2019-08-05 NOTE — Telephone Encounter (Signed)
CSW continuing to attempt to make contact with [employer] to help pt attest his disability application that was filed through his lawyers office.  CSW attempted calling [employer] office multiple times but unable to reach a representative.  CSW will continue to follow and assist as needed  Jorge Ny, [facility] Desk#: [phone] Cell#: [phone]

## 2019-08-06 ENCOUNTER — Telehealth (HOSPITAL_COMMUNITY): Payer: Self-pay | Admitting: Licensed Clinical Social Worker

## 2019-08-06 ENCOUNTER — Other Ambulatory Visit (HOSPITAL_COMMUNITY): Payer: Self-pay | Admitting: Cardiology

## 2019-08-06 MED ORDER — WARFARIN SODIUM 5 MG PO TABS: ORAL_TABLET | ORAL | 0 refills | Status: DC

## 2019-08-06 NOTE — Telephone Encounter (Signed)
Pt texted CSW to inform that he completed interview with [employer] representative.  Pt then inquired if CSW could assist with getting warfarin refilled.  Pt has not had coumadin appt in some time so RN sent refill request to coumadin appt.  Pt then contacted CSW to inform of coumadin appt on 12/23 and request transport- CSW set up through [employer]- transport confirmed.  CSW will continue to follow and assist as needed  Mark [name], [facility] Desk#: [phone] Cell#: [phone]

## 2019-08-06 NOTE — Telephone Encounter (Signed)
Called the pt since he is overdue and last seen 9/258/2020; pt states he has been in the hospital and had not had time to call. Advised that he is putting himself at risk for stroke and blood clots when he does not adhere to appt or have INR checked. He stated he is out of a lot of meds and has called for the others. He was made aware that I could send in Warfarin today and it will be only enough to get to appt. He stated his Education officer, museum will be sure he gets to his appt so he will be here.

## 2019-08-06 NOTE — Telephone Encounter (Signed)
Patient called to request refill on coumadin Advised would forward to [facility]

## 2019-08-08 MED FILL — PROAIR HFA 90 MCG INHALER: 108 (90 BAS | 16 days supply | Qty: 9 | Fill #1

## 2019-08-09 ENCOUNTER — Other Ambulatory Visit: Payer: Self-pay | Admitting: *Deleted

## 2019-08-09 MED ORDER — WARFARIN SODIUM 5 MG PO TABS: ORAL_TABLET | ORAL | 0 refills | Status: DC

## 2019-08-13 ENCOUNTER — Telehealth (HOSPITAL_COMMUNITY): Payer: Self-pay

## 2019-08-13 ENCOUNTER — Other Ambulatory Visit (HOSPITAL_COMMUNITY): Payer: Self-pay

## 2019-08-13 NOTE — Telephone Encounter (Signed)
I called [name] to notify him that the warfarin was sent to the [facility] on [address].  He said that he went there yesterday to pick up his meds but he was only given digoxin.  I called and spoke with the pharmacist and she that the med will be ready for pickup in 40 mins.  [name] said that he will get it tonight or tomorrow morning I will f/u with him to make sure he has it.  If he hadn't gone back out there I will pick it up for him.   He admits that he did pick up the inhaler but he want the solution instead.

## 2019-08-13 NOTE — Progress Notes (Signed)
Mark [name] text yesterday that he's still out of coumadin and albuterol.  I went to [facility], the pharmacist said that the prescription was sent to [facility] on [address].  She also said that the albuterol inhaler was picked up Friday 12/18.

## 2019-08-14 ENCOUNTER — Ambulatory Visit (INDEPENDENT_AMBULATORY_CARE_PROVIDER_SITE_OTHER): Payer: Medicaid Other | Admitting: *Deleted

## 2019-08-14 ENCOUNTER — Other Ambulatory Visit: Payer: Self-pay

## 2019-08-14 ENCOUNTER — Other Ambulatory Visit (HOSPITAL_COMMUNITY): Payer: Self-pay | Admitting: *Deleted

## 2019-08-14 ENCOUNTER — Other Ambulatory Visit: Payer: Self-pay | Admitting: *Deleted

## 2019-08-14 ENCOUNTER — Telehealth: Payer: Self-pay | Admitting: *Deleted

## 2019-08-14 DIAGNOSIS — Z5181 Encounter for therapeutic drug level monitoring: Secondary | ICD-10-CM

## 2019-08-14 DIAGNOSIS — I513 Intracardiac thrombosis, not elsewhere classified: Secondary | ICD-10-CM

## 2019-08-14 LAB — POCT INR: INR: 2.6 (ref 2.0–3.0)

## 2019-08-14 MED ORDER — ALBUTEROL SULFATE (2.5 MG/3ML) 0.083% IN NEBU: 2.5000 mg | INHALATION_SOLUTION | RESPIRATORY_TRACT | 2 refills | Status: AC | PRN

## 2019-08-14 MED ORDER — WARFARIN SODIUM 5 MG PO TABS: ORAL_TABLET | ORAL | 0 refills | Status: AC

## 2019-08-14 NOTE — Patient Instructions (Signed)
Description   Start taking 1 tablet daily except for 1/2 a tablet on Tuesday, Thursday and Saturday. Recheck INR in 1 week. [facility] Clinic#[phone] Main [phone]

## 2019-08-14 NOTE — Telephone Encounter (Signed)
Pt here today for INR check in the coumadin clinic.  He reported to the check in staff that he felt SOB.  Went to lobby to speak with patient who was in a wheelchair.  He was talking on his cell phone and did not appear to be in any distress.  Took pt back to an exam room to further evaluate.  Pt reports he has been out of his coumadin for several days now and feels like he is more SOB b/c his "blood is thicker".  He states he has noticed this feeling for the past 3 to 4 days.  He denies any wt gains or edema at this time. Pt is a pt in the [facility].  He has a milrinone drip that is infusing. Taking all other medications as listed.  CHF Clinic was called in regards to his c/o SOB.  Instructed his case had been reviewed and an inhaler is being called in for him as they feel his SOB is more of a "lung problem than related to his heart."  Advised pt the inhaler is being called in and is to be delivered to his home address ASAP.  Pt states understanding and will contact CHF clinic if any further questions or concerns.

## 2019-08-21 ENCOUNTER — Other Ambulatory Visit: Payer: Self-pay

## 2019-08-21 DIAGNOSIS — J449 Chronic obstructive pulmonary disease, unspecified: Secondary | ICD-10-CM

## 2019-08-21 MED ORDER — ALBUTEROL SULFATE HFA 108 (90 BASE) MCG/ACT IN AERS: 1.0000 | INHALATION_SPRAY | RESPIRATORY_TRACT | 3 refills | Status: AC | PRN

## 2019-08-21 NOTE — Telephone Encounter (Signed)
*  STAT* If patient is at the pharmacy, call can be transferred to refill team.   1. Which medications need to be refilled? (please list name of each medication and dose if known) albuterol (VENTOLIN HFA) 108 (90 Base) MCG/ACT inhaler  2. Which pharmacy/location (including street and city if local pharmacy) is medication to be sent to? [facility] [phone]  3. Do they need a 30 day or 90 day supply? [place]

## 2019-08-22 ENCOUNTER — Telehealth (HOSPITAL_COMMUNITY): Payer: Self-pay | Admitting: Licensed Clinical Social Worker

## 2019-08-22 NOTE — Telephone Encounter (Signed)
CSW received email from pt lawyer's office requesting further documentation to send to [facility].  CSW still has release signed by patient to send documentation to his lawyer so sent requested documents for review.  CSW will continue to follow and assist as needed  Jorge Ny, [facility] Desk#: [phone] Cell#: [phone]

## 2019-08-26 ENCOUNTER — Other Ambulatory Visit: Payer: Self-pay

## 2019-08-26 ENCOUNTER — Emergency Department (HOSPITAL_COMMUNITY): Payer: Medicaid Other

## 2019-08-26 ENCOUNTER — Encounter (HOSPITAL_COMMUNITY): Payer: Self-pay | Admitting: Emergency Medicine

## 2019-08-26 ENCOUNTER — Inpatient Hospital Stay (HOSPITAL_COMMUNITY)
Admission: EM | Admit: 2019-08-26 | Discharge: 2019-09-23 | DRG: 292 | Disposition: E | Payer: Medicaid Other | Attending: Internal Medicine | Admitting: Internal Medicine

## 2019-08-26 DIAGNOSIS — I444 Left anterior fascicular block: Secondary | ICD-10-CM | POA: Diagnosis present

## 2019-08-26 DIAGNOSIS — I428 Other cardiomyopathies: Secondary | ICD-10-CM | POA: Diagnosis present

## 2019-08-26 DIAGNOSIS — R0600 Dyspnea, unspecified: Secondary | ICD-10-CM

## 2019-08-26 DIAGNOSIS — E8779 Other fluid overload: Secondary | ICD-10-CM

## 2019-08-26 DIAGNOSIS — N182 Chronic kidney disease, stage 2 (mild): Secondary | ICD-10-CM | POA: Diagnosis present

## 2019-08-26 DIAGNOSIS — Z881 Allergy status to other antibiotic agents status: Secondary | ICD-10-CM

## 2019-08-26 DIAGNOSIS — J449 Chronic obstructive pulmonary disease, unspecified: Secondary | ICD-10-CM | POA: Diagnosis present

## 2019-08-26 DIAGNOSIS — I9589 Other hypotension: Secondary | ICD-10-CM | POA: Diagnosis not present

## 2019-08-26 DIAGNOSIS — Z888 Allergy status to other drugs, medicaments and biological substances status: Secondary | ICD-10-CM

## 2019-08-26 DIAGNOSIS — I5023 Acute on chronic systolic (congestive) heart failure: Principal | ICD-10-CM | POA: Diagnosis present

## 2019-08-26 DIAGNOSIS — E871 Hypo-osmolality and hyponatremia: Secondary | ICD-10-CM | POA: Diagnosis present

## 2019-08-26 DIAGNOSIS — K117 Disturbances of salivary secretion: Secondary | ICD-10-CM

## 2019-08-26 DIAGNOSIS — F1410 Cocaine abuse, uncomplicated: Secondary | ICD-10-CM | POA: Diagnosis present

## 2019-08-26 DIAGNOSIS — Z515 Encounter for palliative care: Secondary | ICD-10-CM | POA: Diagnosis not present

## 2019-08-26 DIAGNOSIS — Z7901 Long term (current) use of anticoagulants: Secondary | ICD-10-CM

## 2019-08-26 DIAGNOSIS — Z66 Do not resuscitate: Secondary | ICD-10-CM | POA: Diagnosis present

## 2019-08-26 DIAGNOSIS — I5084 End stage heart failure: Secondary | ICD-10-CM | POA: Diagnosis present

## 2019-08-26 DIAGNOSIS — Z20822 Contact with and (suspected) exposure to covid-19: Secondary | ICD-10-CM | POA: Diagnosis present

## 2019-08-26 DIAGNOSIS — N179 Acute kidney failure, unspecified: Secondary | ICD-10-CM | POA: Diagnosis present

## 2019-08-26 DIAGNOSIS — Z9119 Patient's noncompliance with other medical treatment and regimen: Secondary | ICD-10-CM

## 2019-08-26 DIAGNOSIS — Z8673 Personal history of transient ischemic attack (TIA), and cerebral infarction without residual deficits: Secondary | ICD-10-CM

## 2019-08-26 DIAGNOSIS — Z8249 Family history of ischemic heart disease and other diseases of the circulatory system: Secondary | ICD-10-CM

## 2019-08-26 DIAGNOSIS — Z880 Allergy status to penicillin: Secondary | ICD-10-CM

## 2019-08-26 DIAGNOSIS — Z885 Allergy status to narcotic agent status: Secondary | ICD-10-CM

## 2019-08-26 DIAGNOSIS — E878 Other disorders of electrolyte and fluid balance, not elsewhere classified: Secondary | ICD-10-CM | POA: Diagnosis present

## 2019-08-26 DIAGNOSIS — Z87891 Personal history of nicotine dependence: Secondary | ICD-10-CM

## 2019-08-26 DIAGNOSIS — I5082 Biventricular heart failure: Secondary | ICD-10-CM | POA: Diagnosis present

## 2019-08-26 DIAGNOSIS — R000 Tachycardia, unspecified: Secondary | ICD-10-CM | POA: Diagnosis present

## 2019-08-26 LAB — BASIC METABOLIC PANEL
Anion gap: 13 (ref 5–15)
BUN: 49 mg/dL — ABNORMAL HIGH (ref 6–20)
CO2: 27 mmol/L (ref 22–32)
Calcium: 9.2 mg/dL (ref 8.9–10.3)
Chloride: 87 mmol/L — ABNORMAL LOW (ref 98–111)
Creatinine, Ser: 1.85 mg/dL — ABNORMAL HIGH (ref 0.61–1.24)
GFR calc Af Amer: 50 mL/min — ABNORMAL LOW (ref >60)
GFR calc non Af Amer: 43 mL/min — ABNORMAL LOW (ref >60)
Glucose, Bld: 100 mg/dL — ABNORMAL HIGH (ref 70–99)
Potassium: 4.3 mmol/L (ref 3.5–5.1)
Sodium: 127 mmol/L — ABNORMAL LOW (ref 135–145)

## 2019-08-26 LAB — TROPONIN I (HIGH SENSITIVITY): Troponin I (High Sensitivity): 70 ng/L — ABNORMAL HIGH (ref <18)

## 2019-08-26 LAB — CBC
HCT: 38 % — ABNORMAL LOW (ref 39.0–52.0)
Hemoglobin: 12.9 g/dL — ABNORMAL LOW (ref 13.0–17.0)
MCH: 25.5 pg — ABNORMAL LOW (ref 26.0–34.0)
MCHC: 33.9 g/dL (ref 30.0–36.0)
MCV: 75.1 fL — ABNORMAL LOW (ref 80.0–100.0)
Platelets: 145 10*3/uL — ABNORMAL LOW (ref 150–400)
RBC: 5.06 MIL/uL (ref 4.22–5.81)
RDW: 16 % — ABNORMAL HIGH (ref 11.5–15.5)
WBC: 5.4 10*3/uL (ref 4.0–10.5)
nRBC: 0 % (ref 0.0–0.2)

## 2019-08-26 MED ORDER — SODIUM CHLORIDE 0.9% FLUSH
3.0000 mL | Freq: Once | INTRAVENOUS | Status: AC
  Administered 2019-08-29: 3 mL via INTRAVENOUS

## 2019-08-26 NOTE — ED Triage Notes (Signed)
Pt BIB [facility] from home, EMS reports pt called for shortness of breath, fever, muscle aches. Pt reports that he tested negative for covid, but has had increased shortness of breath and chest pain. Reports hx CHF, bilateral leg swelling and some weight gain. Also c/o intermittent nose bleeds.

## 2019-08-27 DIAGNOSIS — Z888 Allergy status to other drugs, medicaments and biological substances status: Secondary | ICD-10-CM | POA: Diagnosis not present

## 2019-08-27 DIAGNOSIS — Z66 Do not resuscitate: Secondary | ICD-10-CM | POA: Diagnosis present

## 2019-08-27 DIAGNOSIS — I5023 Acute on chronic systolic (congestive) heart failure: Principal | ICD-10-CM

## 2019-08-27 DIAGNOSIS — K117 Disturbances of salivary secretion: Secondary | ICD-10-CM | POA: Diagnosis not present

## 2019-08-27 DIAGNOSIS — R570 Cardiogenic shock: Secondary | ICD-10-CM | POA: Diagnosis not present

## 2019-08-27 DIAGNOSIS — R000 Tachycardia, unspecified: Secondary | ICD-10-CM | POA: Diagnosis present

## 2019-08-27 DIAGNOSIS — R079 Chest pain, unspecified: Secondary | ICD-10-CM | POA: Diagnosis not present

## 2019-08-27 DIAGNOSIS — N179 Acute kidney failure, unspecified: Secondary | ICD-10-CM

## 2019-08-27 DIAGNOSIS — J449 Chronic obstructive pulmonary disease, unspecified: Secondary | ICD-10-CM | POA: Diagnosis present

## 2019-08-27 DIAGNOSIS — Z87891 Personal history of nicotine dependence: Secondary | ICD-10-CM | POA: Diagnosis not present

## 2019-08-27 DIAGNOSIS — R0600 Dyspnea, unspecified: Secondary | ICD-10-CM | POA: Diagnosis not present

## 2019-08-27 DIAGNOSIS — I428 Other cardiomyopathies: Secondary | ICD-10-CM | POA: Diagnosis present

## 2019-08-27 DIAGNOSIS — I5082 Biventricular heart failure: Secondary | ICD-10-CM

## 2019-08-27 DIAGNOSIS — Z885 Allergy status to narcotic agent status: Secondary | ICD-10-CM | POA: Diagnosis not present

## 2019-08-27 DIAGNOSIS — Z7901 Long term (current) use of anticoagulants: Secondary | ICD-10-CM | POA: Diagnosis not present

## 2019-08-27 DIAGNOSIS — Z881 Allergy status to other antibiotic agents status: Secondary | ICD-10-CM | POA: Diagnosis not present

## 2019-08-27 DIAGNOSIS — Z880 Allergy status to penicillin: Secondary | ICD-10-CM | POA: Diagnosis not present

## 2019-08-27 DIAGNOSIS — N182 Chronic kidney disease, stage 2 (mild): Secondary | ICD-10-CM | POA: Diagnosis present

## 2019-08-27 DIAGNOSIS — Z8673 Personal history of transient ischemic attack (TIA), and cerebral infarction without residual deficits: Secondary | ICD-10-CM | POA: Diagnosis not present

## 2019-08-27 DIAGNOSIS — Z20822 Contact with and (suspected) exposure to covid-19: Secondary | ICD-10-CM | POA: Diagnosis present

## 2019-08-27 DIAGNOSIS — Z9119 Patient's noncompliance with other medical treatment and regimen: Secondary | ICD-10-CM | POA: Diagnosis not present

## 2019-08-27 DIAGNOSIS — F1410 Cocaine abuse, uncomplicated: Secondary | ICD-10-CM | POA: Diagnosis present

## 2019-08-27 DIAGNOSIS — Z515 Encounter for palliative care: Secondary | ICD-10-CM | POA: Diagnosis not present

## 2019-08-27 DIAGNOSIS — I5084 End stage heart failure: Secondary | ICD-10-CM | POA: Diagnosis present

## 2019-08-27 DIAGNOSIS — I444 Left anterior fascicular block: Secondary | ICD-10-CM | POA: Diagnosis present

## 2019-08-27 DIAGNOSIS — E871 Hypo-osmolality and hyponatremia: Secondary | ICD-10-CM | POA: Diagnosis present

## 2019-08-27 DIAGNOSIS — E878 Other disorders of electrolyte and fluid balance, not elsewhere classified: Secondary | ICD-10-CM | POA: Diagnosis present

## 2019-08-27 DIAGNOSIS — Z8249 Family history of ischemic heart disease and other diseases of the circulatory system: Secondary | ICD-10-CM | POA: Diagnosis not present

## 2019-08-27 LAB — LACTIC ACID, PLASMA: Lactic Acid, Venous: 1.8 mmol/L (ref 0.5–1.9)

## 2019-08-27 LAB — POC SARS CORONAVIRUS 2 AG -  ED: SARS Coronavirus 2 Ag: NEGATIVE

## 2019-08-27 LAB — RAPID URINE DRUG SCREEN, HOSP PERFORMED
Amphetamines: NOT DETECTED
Barbiturates: NOT DETECTED
Benzodiazepines: NOT DETECTED
Cocaine: POSITIVE — AB
Opiates: NOT DETECTED
Tetrahydrocannabinol: NOT DETECTED

## 2019-08-27 LAB — DIGOXIN LEVEL: Digoxin Level: 0.4 ng/mL — ABNORMAL LOW (ref 0.8–2.0)

## 2019-08-27 LAB — PROTIME-INR
INR: 4 — ABNORMAL HIGH (ref 0.8–1.2)
Prothrombin Time: 38.7 seconds — ABNORMAL HIGH (ref 11.4–15.2)

## 2019-08-27 LAB — BRAIN NATRIURETIC PEPTIDE: B Natriuretic Peptide: 1504.3 pg/mL — ABNORMAL HIGH (ref 0.0–100.0)

## 2019-08-27 LAB — TROPONIN I (HIGH SENSITIVITY)
Troponin I (High Sensitivity): 56 ng/L — ABNORMAL HIGH (ref <18)
Troponin I (High Sensitivity): 63 ng/L — ABNORMAL HIGH (ref <18)

## 2019-08-27 MED ORDER — WARFARIN - PHARMACIST DOSING INPATIENT: Freq: Every day | Status: DC

## 2019-08-27 MED ORDER — ALBUTEROL SULFATE HFA 108 (90 BASE) MCG/ACT IN AERS
1.0000 | INHALATION_SPRAY | Freq: Four times a day (QID) | RESPIRATORY_TRACT | Status: DC | PRN
  Administered 2019-08-27 (×2): 2 via RESPIRATORY_TRACT
  Filled 2019-08-27: qty 6.7

## 2019-08-27 MED ORDER — CHLORHEXIDINE GLUCONATE CLOTH 2 % EX PADS
6.0000 | MEDICATED_PAD | Freq: Every day | CUTANEOUS | Status: DC
  Administered 2019-08-27: 6 via TOPICAL

## 2019-08-27 MED ORDER — SODIUM CHLORIDE 0.9 % IV SOLN: 250.0000 mL | INTRAVENOUS | Status: DC | PRN

## 2019-08-27 MED ORDER — SODIUM CHLORIDE 0.9% FLUSH: 10.0000 mL | INTRAVENOUS | Status: DC | PRN

## 2019-08-27 MED ORDER — SODIUM CHLORIDE 0.9% FLUSH
3.0000 mL | Freq: Two times a day (BID) | INTRAVENOUS | Status: DC
  Administered 2019-08-29: 3 mL via INTRAVENOUS

## 2019-08-27 MED ORDER — FUROSEMIDE 10 MG/ML IJ SOLN
12.0000 mg/h | INTRAVENOUS | Status: DC
  Administered 2019-08-27 – 2019-08-29 (×3): 12 mg/h via INTRAVENOUS
  Filled 2019-08-27 (×2): qty 25
  Filled 2019-08-27: qty 21
  Filled 2019-08-27: qty 25

## 2019-08-27 MED ORDER — ONDANSETRON HCL 4 MG/2ML IJ SOLN
4.0000 mg | Freq: Four times a day (QID) | INTRAMUSCULAR | Status: DC | PRN
  Administered 2019-08-28 – 2019-08-29 (×4): 4 mg via INTRAVENOUS
  Filled 2019-08-27 (×5): qty 2

## 2019-08-27 MED ORDER — SODIUM CHLORIDE 0.9% FLUSH: 3.0000 mL | INTRAVENOUS | Status: DC | PRN

## 2019-08-27 MED ORDER — MILRINONE LACTATE IN DEXTROSE 20-5 MG/100ML-% IV SOLN
0.5000 ug/kg/min | INTRAVENOUS | Status: DC
  Administered 2019-08-27 – 2019-08-29 (×5): 0.5 ug/kg/min via INTRAVENOUS
  Filled 2019-08-27 (×7): qty 100

## 2019-08-27 MED ORDER — FUROSEMIDE 10 MG/ML IJ SOLN: 40.0000 mg | Freq: Once | INTRAMUSCULAR | Status: DC

## 2019-08-27 MED ORDER — FUROSEMIDE 10 MG/ML IJ SOLN
80.0000 mg | Freq: Once | INTRAMUSCULAR | Status: AC
  Administered 2019-08-27: 80 mg via INTRAVENOUS
  Filled 2019-08-27: qty 8

## 2019-08-27 MED ORDER — ACETAMINOPHEN 325 MG PO TABS: 650.0000 mg | ORAL_TABLET | ORAL | Status: DC | PRN

## 2019-08-27 MED ORDER — ALBUTEROL SULFATE (2.5 MG/3ML) 0.083% IN NEBU: 2.5000 mg | INHALATION_SOLUTION | Freq: Four times a day (QID) | RESPIRATORY_TRACT | Status: DC | PRN

## 2019-08-27 MED ORDER — SODIUM CHLORIDE 0.9 % IV BOLUS
500.0000 mL | Freq: Once | INTRAVENOUS | Status: AC
  Administered 2019-08-27: 500 mL via INTRAVENOUS

## 2019-08-27 NOTE — Progress Notes (Signed)
ANTICOAGULATION CONSULT NOTE - Initial Consult  Pharmacy Consult for warfarin Indication: apical mural thrombus  Allergies  Allergen Reactions  . Bactrim [Sulfamethoxazole-Trimethoprim] Swelling and Rash    Facial swelling  . Clindamycin/Lincomycin Swelling and Palpitations  . Hydrocodone Hives  . Lasix [Furosemide] Hives and Swelling    Facial swelling Reaction to name brand LASIX  . Lisinopril Swelling and Other (See Comments)    Facial swelling/angioedema  . Olopatadine Hcl Swelling    Makes his face swell up  . Penicillins Hives and Swelling     Has patient had a PCN reaction causing immediate rash, facial/tongue/throat swelling, SOB or lightheadedness with hypotension: Yes Has patient had a PCN reaction causing severe rash involving mucus membranes or skin necrosis: No Has patient had a PCN reaction that required hospitalization: No Has patient had a PCN reaction occurring within the last 10 years: No If all of the above answers are "NO", then may proceed with Cephalosporin use.   . Prednisone Shortness Of Breath, Nausea Only, Swelling and Other (See Comments)    Also made chest feel tight and genital area, legs, and face became swollen badly    Patient Measurements: Weight: 142 lb (64.4 kg)  Vital Signs: Temp: 98.6 F (37 C) (01/05 0535) Temp Source: Oral (01/05 0535) BP: 128/87 (01/05 1615) Pulse Rate: 131 (01/05 1615)  Labs: Recent Labs    09/11/2019 1716 08/27/19 0858 08/27/19 1130  HGB 12.9*  --   --   HCT 38.0*  --   --   PLT 145*  --   --   LABPROT  --  38.7*  --   INR  --  4.0*  --   CREATININE 1.85*  --   --   TROPONINIHS 70* 56* 63*    Estimated Creatinine Clearance: 45.9 mL/min (A) (by C-G formula based on SCr of 1.85 mg/dL (H)).   Medical History: Past Medical History:  Diagnosis Date  . Asthma   . Chronic systolic CHF (congestive heart failure) (HCC)   . Cigarette smoker   . CKD (chronic kidney disease), stage II    Mark [name] 10/01/2017  .  COPD (chronic obstructive pulmonary disease) (HCC) 10/21/2017   on CT scan chest  . History of echocardiogram    a. Echo 5/17 - EF 20-25%, severe diff HK, restrictive physiology, mild to mod MR, severe reduced RVSF, mod RVE, mild RAE, mod TR, PASP 48 mmHg  . Hx of cardiac cath    a. LHC 5/17 - normal coronary arteries. PA 45/25, mean 33, PCWP mean 18  . NICM (nonischemic cardiomyopathy) (HCC)   . Stroke (HCC) 09/27/2017   "was weak on my left side; I'm fully recovered" (11/09/2017)  . Substance abuse (HCC)    cocaine, marijuana    Medications:  Scheduled:  . Chlorhexidine Gluconate Cloth  6 each Topical Daily  . sodium chloride flush  3 mL Intravenous Once  . sodium chloride flush  3 mL Intravenous Q12H  . Warfarin - Pharmacist Dosing Inpatient   Does not apply q1800    Assessment: 43 yom presenting with SOB and leg edema. On warfarin PTA - LD 1/3.   INR today came back at 4. Hgb 12.9, plt 145 - on last check 1/4. No s/sx of bleeding.   PTA regimen is 5 mg daily except 2.5 mg TTSat.   Goal of Therapy:  Heparin level 0.3-0.7 units/ml Monitor platelets by anticoagulation protocol: Yes   Plan:  Hold warfarin tonight Monitor daily INR, CBC, and for  s/sx of bleeding.   Antonietta Jewel, PharmD, BCCCP Clinical Pharmacist  Phone: [phone]  Please check AMION for all [place] phone numbers After 10:00 PM, call [place] [phone] 08/27/2019,4:27 PM

## 2019-08-27 NOTE — ED Notes (Signed)
Pt was transferred to hospital bed

## 2019-08-27 NOTE — ED Notes (Signed)
Pt provided breakfast tray, eating.

## 2019-08-27 NOTE — ED Notes (Signed)
Pt home pump with primacor infusing discontinued- pt placed on hospital pump. Home pump placed with pt property.

## 2019-08-27 NOTE — ED Provider Notes (Signed)
[name] EMERGENCY DEPARTMENT Provider Note   CSN: [id number] Arrival date & time: 09-07-19  1622     History Chief Complaint  Patient presents with  . Chest Pain    Mark [name] is a 44 y.o. male   HPI Patient is 44 year old male with hx of severe biventricular failure with elevated filling pressures and moderately reduced CO on milrinone 0.375 via PICC line at home with torsemide and furosemide.  Also on warfarin because of patient with history of mural thrombus.  Patient presents today with complaints of increased shortness of breath over the past 2 days with decreased appetite, chills, body aches, heart palpitations, subjective fevers, severe leg swelling, increased weight. Patient endorses decreased appetite for the past 4 days.  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  Denies any dietary indiscretion and states that he has not eaten in 4 days.  Denies any salt or alcohol use denies any cocaine use.  States he has been compliant with his 80 mg of Lasix daily furosemide and 20 mg of daily torsemide.  Patient states he has been tested for COVID-19 numerous times over the past 2 weeks.  States that all of his tests of been negative.  Patient resents his chest pain as more of a generalized ache that he describes as full body.  Denies any radiation of pain.  States it is nonexertional.    On review of EMR most recent echo 06/18/2019 EF 20-25% with severely decreased LV function. There is no evidence of ischemia.  Most recently admitted 06/09/2019 with recurrent biventricular heart failure/cardiogenic shock and possible pneumonia.  06/20/2019 was DC hospitalized for cardiogenic shock.  Was placed on milrinone and diuresed with IV Lasix transition to 80 mg daily Lasix.     Past Medical History:  Diagnosis Date  . Asthma   . Chronic systolic CHF (congestive heart  failure) (HCC)   . Cigarette smoker   . CKD (chronic kidney disease), stage II    [name] 10/01/2017  . COPD (chronic obstructive pulmonary disease) (HCC) 10/21/2017   on CT scan chest  . History of echocardiogram    a. Echo 5/17 - EF 20-25%, severe diff HK, restrictive physiology, mild to mod MR, severe reduced RVSF, mod RVE, mild RAE, mod TR, PASP 48 mmHg  . Hx of cardiac cath    a. LHC 5/17 - normal coronary arteries. PA 45/25, mean 33, PCWP mean 18  . NICM (nonischemic cardiomyopathy) (HCC)   . Stroke (HCC) 09/27/2017   "was weak on my left side; I'm fully recovered" (11/09/2017)  . Substance abuse (HCC)    cocaine, marijuana    Patient Active Problem List   Diagnosis Date Noted  . Acute on chronic systolic heart failure (HCC) 07/24/2019  . Palliative care by specialist   . Acute kidney injury (nontraumatic) (HCC)   . Right middle lobe pneumonia 06/09/2019  . Polysubstance abuse (HCC) 06/09/2019  . Chronic hyponatremia 06/09/2019  . Malnutrition of moderate degree 04/10/2019  . Cardiogenic shock (HCC) 04/09/2019  . Allergic reaction caused by a drug 02/14/2019  . Abnormal liver function 02/14/2019  . Encounter for therapeutic drug monitoring 02/13/2019  . Goals of care, counseling/discussion 02/13/2019  . 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 Acute CHF (congestive heart failure) (HCC) 01/24/2019  . Shortness of breath   . Cocaine use   . Acute on chronic systolic (congestive) heart failure (  HCC) 12/25/2018  . Demand ischemia (HCC)   . LV (left ventricular) mural thrombus without MI (HCC)   . Noncompliance   . Chronic anemia   . Acute systolic CHF (congestive heart failure) (HCC) 11/09/2017  . History of right MCA stroke 09/28/2017  . Stroke (HCC) 09/28/2017  . Chronic combined systolic and diastolic congestive heart failure (HCC) 01/11/2016  . NICM (nonischemic cardiomyopathy) (HCC) 01/11/2016  . CKD (chronic kidney disease) stage 2, GFR 60-89 ml/min 01/11/2016  . Cocaine  abuse (HCC) 01/11/2016  . Chest pain, pleuritic 01/03/2016  . Tobacco abuse 01/03/2016  . Asthma 01/03/2016  . Leg swelling 01/03/2016  . Tachycardia 01/03/2016  . Normocytic anemia 01/03/2016  . Elevated troponin I level 01/03/2016  . Right rib fracture 01/03/2016  . Chest pain     Past Surgical History:  Procedure Laterality Date  . CARDIAC CATHETERIZATION N/A 01/05/2016   Procedure: Right/Left Heart Cath and Coronary Angiography;  Surgeon: Lennette Bihari, MD;  Location: [facility] INVASIVE CV LAB;  Service: Cardiovascular;  Laterality: N/A;  . MULTIPLE EXTRACTIONS WITH ALVEOLOPLASTY Bilateral 04/11/2019   Procedure: Extraction of tooth #'s 2, 4, 5, 10, and 12 with alveoloplasty;  Surgeon: Charlynne Pander, DDS;  Location: [facility] OR;  Service: Oral Surgery;  Laterality: Bilateral;  . RIGHT HEART CATH N/A 04/09/2019   Procedure: RIGHT HEART CATH;  Surgeon: Dolores Patty, MD;  Location: [facility] INVASIVE CV LAB;  Service: Cardiovascular;  Laterality: N/A;  . RIGHT HEART CATH N/A 06/17/2019   Procedure: RIGHT HEART CATH;  Surgeon: Dolores Patty, MD;  Location: [facility] INVASIVE CV LAB;  Service: Cardiovascular;  Laterality: N/A;       Family History  Problem Relation Age of Onset  . Cardiomyopathy Father        Reports his father has an LVAD  . Heart failure Father   . Hypertension Father   . Heart disease Maternal Grandmother        had a whole in her heart  . Deep vein thrombosis Neg Hx     Social History   Tobacco Use  . Smoking status: Former Smoker    Packs/day: 1.00    Years: 30.00    Pack years: 30.00    Types: Cigarettes    Quit date: 09/27/2017    Years since quitting: 1.9  . Smokeless tobacco: Never Used  Substance Use Topics  . Alcohol use: Yes    Alcohol/week: 5.0 standard drinks    Types: 5 Shots of liquor per week    Comment: 5-6 shots of vodka daily; "stopped it all after I had stroke 09/27/2017"  . Drug use: Yes    Types: Cocaine, Marijuana    Comment: 11/09/2017  "none since 09/27/2017"    Home Medications Prior to Admission medications   Medication Sig Start Date End Date Taking? Authorizing Provider  albuterol (PROVENTIL) (2.5 MG/3ML) 0.083% nebulizer solution Take 3 mLs (2.5 mg total) by nebulization every 4 (four) hours as needed for wheezing. 08/14/19   Clegg, Amy D, NP  albuterol (VENTOLIN HFA) 108 (90 Base) MCG/ACT inhaler Inhale 1-2 puffs into the lungs every 4 (four) hours as needed for wheezing or shortness of breath. 08/21/19   Robbie Lis M, PA-C  digoxin (LANOXIN) 0.125 MG tablet Take 1 tablet (0.125 mg total) by mouth daily. 12/30/18   Marguerita Merles Latif, DO  Milrinone Lactate 50 MG/50ML SOLN Inject 0.5 mcg/kg/min into the vein continuous. Weight 65.5 kg Pump rate 2 ml/hr    [provider]  Multiple Vitamin (MULTIVITAMIN WITH MINERALS) TABS tablet Take 1 tablet by mouth daily.    [provider]  potassium chloride SA (KLOR-CON) 20 MEQ tablet Take 1 tablet (20 mEq total) by mouth daily. 06/25/19   Clegg, Amy D, NP  torsemide (DEMADEX) 20 MG tablet Take 3 tablets (60 mg total) by mouth daily. 06/25/19   Tonye Becket D, NP  warfarin (COUMADIN) 5 MG tablet Take as directed by the coumadin clinic 08/14/19   Bensimhon, Bevelyn Buckles, MD    Allergies    Bactrim [sulfamethoxazole-trimethoprim], Clindamycin/lincomycin, Hydrocodone, Lasix [furosemide], Lisinopril, Olopatadine hcl, Penicillins, and Prednisone  Review of Systems   Review of Systems  Constitutional: Positive for activity change, appetite change, chills, fatigue and fever.  HENT: Negative for congestion.   Eyes: Negative for pain.  Respiratory: Positive for shortness of breath.   Cardiovascular: Positive for chest pain, palpitations and leg swelling.  Gastrointestinal: Negative for abdominal pain and vomiting.  Genitourinary: Negative for dysuria.  Musculoskeletal: Positive for myalgias.  Skin: Negative for rash.  Neurological: Positive for headaches. Negative  for dizziness and syncope.    Physical Exam Updated Vital Signs BP 120/90 (BP Location: Left Wrist)   Pulse (!) 102   Temp 98.6 F (37 C) (Oral)   Resp 16   SpO2 97%   Physical Exam Vitals and nursing note reviewed.  Constitutional:      General: He is not in acute distress. HENT:     Head: Normocephalic and atraumatic.     Nose: Nose normal.     Mouth/Throat:     Mouth: Mucous membranes are moist.  Eyes:     General: No scleral icterus. Neck:     Comments: JVD present with hepatojugular reflex Cardiovascular:     Rate and Rhythm: Regular rhythm. Tachycardia present.     Pulses: Normal pulses.     Heart sounds: Normal heart sounds.     Comments: Tachycardic with displaced apical impulse and palpable cardiac heave Pulmonary:     Effort: Pulmonary effort is normal. No respiratory distress.     Breath sounds: Rales present. No wheezing.     Comments: Faint crackles left base. No wheezes. Abdominal:     Palpations: Abdomen is soft.     Tenderness: There is abdominal tenderness. There is no right CVA tenderness, left CVA tenderness, guarding or rebound.     Comments: Very mild right upper quadrant tenderness without guarding or rebound  Musculoskeletal:     Cervical back: Normal range of motion.     Right lower leg: Edema present.     Left lower leg: Edema present.     Comments: BL LE edema 3+ to the mid shin and 2+ to the knee LEs are symmetric  Skin:    General: Skin is warm and dry.     Capillary Refill: Capillary refill takes less than 2 seconds.  Neurological:     Mental Status: He is alert. Mental status is at baseline.  Psychiatric:        Mood and Affect: Mood normal.        Behavior: Behavior normal.     ED Results / Procedures / Treatments   Labs (all labs ordered are listed, but only abnormal results are displayed) Labs Reviewed  BASIC METABOLIC PANEL - Abnormal; Notable for the following components:      Result Value   Sodium 127 (*)    Chloride 87  (*)    Glucose, Bld 100 (*)  BUN 49 (*)    Creatinine, Ser 1.85 (*)    GFR calc non Af Amer 43 (*)    GFR calc Af Amer 50 (*)    All other components within normal limits  CBC - Abnormal; Notable for the following components:   Hemoglobin 12.9 (*)    HCT 38.0 (*)    MCV 75.1 (*)    MCH 25.5 (*)    RDW 16.0 (*)    Platelets 145 (*)    All other components within normal limits  TROPONIN I (HIGH SENSITIVITY) - Abnormal; Notable for the following components:   Troponin I (High Sensitivity) 70 (*)    All other components within normal limits  BRAIN NATRIURETIC PEPTIDE  DIGOXIN LEVEL  PROTIME-INR  LACTIC ACID, PLASMA  LACTIC ACID, PLASMA  POC SARS CORONAVIRUS 2 AG -  ED  TROPONIN I (HIGH SENSITIVITY)  TROPONIN I (HIGH SENSITIVITY)    EKG EKG Interpretation  Date/Time:  Monday August 26 2019 16:46:17 EST Ventricular Rate:  117 PR Interval:  174 QRS Duration: 102 QT Interval:  318 QTC Calculation: 443 R Axis:   -88 Text Interpretation: Sinus tachycardia Right atrial enlargement Pulmonary disease pattern Incomplete right bundle branch block Left anterior fascicular block Septal infarct , age undetermined Abnormal ECG no acute changes Confirmed by Madalyn Rob [phone]) on 09/18/2019 5:01:51 PM   Radiology DG Chest 2 View  Result Date: 08/25/2019 CLINICAL DATA:  Shortness of breath and chest pain EXAM: CHEST - 2 VIEW COMPARISON:  07/24/2019 FINDINGS: Previously seen right-sided PICC line is again noted in the mid superior vena cava. Cardiac shadow is enlarged. The lungs are hyperinflated without focal infiltrate or sizable effusion. No acute bony abnormality is seen. IMPRESSION: COPD without acute abnormality. Electronically Signed   By: Inez Catalina M.D.   On: 09/01/2019 17:03    Procedures Procedures (including critical care time)  Medications Ordered in ED Medications  sodium chloride flush (NS) 0.9 % injection 3 mL (has no administration in time range)  furosemide  (LASIX) injection 80 mg (has no administration in time range)    ED Course  I have reviewed the triage vital signs and the nursing notes.  Pertinent labs & imaging results that were available during my care of the patient were reviewed by me and considered in my medical decision making (see chart for details).    MDM Rules/Calculators/A&P                      Patient with a history of severe CHF with 20 to 25% EF on milrinone presented today with increased weight, leg swelling, shortness of breath, fatigue, body aches as well as chest pain.  At time of seeing patient he has been waiting in ED lobby for over 15 hours.  Initial troponin elevated at 70 however this is likely due to patient's renal dysfunction.  Patient has elevated creatinine compared to 1 month ago with dramatically increased BUN consistent with a prerenal AKI.  Chest x-ray shows evidence of COPD no evidence of infiltrate, pneumonia, pneumothorax.  EKG is unchanged from prior--there is significant ST elevation in V3 and V4 however this is unchanged from prior EKGs appears to be patient's baseline.  I discussed this case with my attending physician who cosigned this note including patient's presenting symptoms, physical exam, and planned diagnostics and interventions. Attending physician stated agreement with plan or made changes to plan which were implemented.   9:07 AM  -- discussed with Mickel Baas  Gold of cardiology who will discuss patient with Dr. [name] and the cardiology team to determine who will admit.   10:31 AM troponin is downtrending.  Lactic acid within normal limits.  Rapid Covid antigen test negative.  INR 4.0 which is mildly supratherapeutic will not redose of warfarin at this time. BNP 1,504 which is very elevated but lower than prior hospitalizations.   Mark [name] was evaluated in Emergency Department on 08/27/2019 for the symptoms described in the history of present illness. He was evaluated in the context of  the global COVID-19 pandemic, which necessitated consideration that the patient might be at risk for infection with the SARS-CoV-2 virus that causes COVID-19. Institutional protocols and algorithms that pertain to the evaluation of patients at risk for COVID-19 are in a state of rapid change based on information released by regulatory bodies including the CDC and federal and state organizations. These policies and algorithms were followed during the patient's care in the ED.  Final Clinical Impression(s) / ED Diagnoses Final diagnoses:  Other hypervolemia  Hyponatremia  Hypochloremia  Tachycardia    Rx / DC Orders ED Discharge Orders    None       Gailen Shelter, [place] 08/27/19 1343    Gerhard Munch, MD 08/28/19 Darrel Hoover    Gerhard Munch, MD 08/28/19 [phone]

## 2019-08-27 NOTE — ED Notes (Signed)
Cardiology at bedside.

## 2019-08-27 NOTE — ED Notes (Signed)
Maurice, RT to initiate CPAP per order parameters.

## 2019-08-27 NOTE — H&P (Addendum)
Advanced Heart Failure Team Consult Note   Primary Physician: Grayce Sessions, NP PCP-Cardiologist:  Verne Carrow, MD  Hospice Patient Reason for Consultation: End Stage Biventricular Heart Fauilure   HPI:    Mark [name] is seen today for evaluation of biventricular heart failure at the request of  Dr Jeraldine Loots.   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   He was readmitted 9/16/20with recurrent cardiogenic shock.Co-ox was in the 30s. He was placed on milrinone and diuresed with IV lasix. Once diuresed he was transitioned to lasix 80 mg daily. Overall diuresed 9 pounds. Milrinone was turned off because he wanted to go home and get his affairs in order. He was referred to Palliative Care and understood he has limited time. He was not ready to transition to Hospice but was agreeable to outpatient Palliative Care.   Readmitted 06/09/19 with recurrent A/C biventricular heart failure/cardiogenic shock and possible PNA. PNA treated with antibiotic course. Placed on milrinone and diuresed with IV lasix. During his hospitalization he refused IV lasix 4 times. He wanted to pursue LVAD work up but was ultimately deemed to high risk for VAD at [facility] due to RV failure, poor PFTs, and social barriers. Discussed referral to [facility] for second opinion but he was adamant he wanted to go home today without milrinone. Milrinone was stopped and PICC was removed.Discharged 06/18/19.   On 06/19/19 he presented to [facility] via EMS with increased shortness of breath. Due to increased WOB he was placed Bipap. Potassium 7, creatinine 1.8, BNP 2045, and lactic acid 8. Hyperkalemia treated in the ED. He was able to tell me he wantedeverything done and would want to go to [facility] if he they would consider VAD.Placed on milrinone 0.375 mcg and diuresed with lasix drip at 12 mg  per hour. Milrinone increased to 0.5 mcg due to low mixed venous saturation. Once diuresed he was transitioned to torsemide 60 mg daily. Dr [name] personally called Dr [name] at [facility] and personally discussed his case. He was not thought to be candidate for LVAD at [facility] to RV failure, poor PFTs, and social barriers.After discussion with Mark [name] he wanted to d/c home on milrinone. We discussed that milrinone will not make him live longer but will beforpalliation. He was agreeable and verbalized understanding the risks of discharging home with PICC/milrinone. Referred to [facility] for home milrinone. [facility] helping w/ mil[facility]one and[facility] provides Hospice services. He was discharged with single lumen PICC.  In the community he is followed by Hospice provided by [facility] for home milrinone as well as HF Paramedicine. His HHRN was Covid + but he was negative. Has been using crack in the community and may be discharged from hospice. Says he ran out medication last week and just got them a few days ago.   Presented to [facility] with increased shortness of breath and leg edema. CXR with COPD but no acute findings. Pertinent admission labs included: K 4.3, creatinine 1.85, BNP 1504, HS Trop 70>56, WBC 5.4, and Hgb 12.9. Covid PCR negative. Started on 80 mg IV lasix.   Echo  06/18/2019 EF 20-25% RV severely reduced.   Review of Systems: [y] = yes, [ ]  = no   . General: Weight gain [Y ]; Weight loss [ ] ; Anorexia [ ] ; Fatigue [Y ]; Fever [ ] ; Chills [ ] ; Weakness [ Y]  . Cardiac: Chest  pain/pressure [ ] ; Resting SOB [ Y]; Exertional SOB [Y ]; Orthopnea [Y ]; Pedal Edema [Y ]; Palpitations [ ] ; Syncope [ ] ; Presyncope [ ] ; Paroxysmal nocturnal dyspnea[ ]   . Pulmonary: Cough [ ] ; Wheezing[ ] ; Hemoptysis[ ] ; Sputum [ ] ; Snoring [ ]   . GI: Vomiting[ ] ; Dysphagia[ ] ; Melena[ ] ; Hematochezia [ ] ; Heartburn[ ] ; Abdominal pain [ ] ; Constipation [ ] ; Diarrhea [ ] ; BRBPR [ ]   . GU: Hematuria[ ] ; Dysuria [ ] ; Nocturia[ ]     . Vascular: Pain in legs with walking [ ] ; Pain in feet with lying flat [ ] ; Non-healing sores [ ] ; Stroke [ ] ; TIA [ ] ; Slurred speech [ ] ;  . Neuro: Headaches[ ] ; Vertigo[ ] ; Seizures[ ] ; Paresthesias[ ] ;Blurred vision [ ] ; Diplopia [ ] ; Vision changes [ ]   . Ortho/Skin: Arthritis [ ] ; Joint pain [Y ]; Muscle pain [ ] ; Joint swelling [ ] ; Back Pain [Y ]; Rash [ ]   . Psych: Depression[ Y]; Anxiety[ ]   . Heme: Bleeding problems [ ] ; Clotting disorders [ ] ; Anemia [ ]   . Endocrine: Diabetes [ ] ; Thyroid dysfunction[ ]   Home Medications Prior to Admission medications   Medication Sig Start Date End Date Taking? Authorizing Provider  albuterol (PROVENTIL) (2.5 MG/3ML) 0.083% nebulizer solution Take 3 mLs (2.5 mg total) by nebulization every 4 (four) hours as needed for wheezing. 08/14/19   Clegg, Amy D, NP  albuterol (VENTOLIN HFA) 108 (90 Base) MCG/ACT inhaler Inhale 1-2 puffs into the lungs every 4 (four) hours as needed for wheezing or shortness of breath. 08/21/19   Robbie Lis M, PA-C  digoxin (LANOXIN) 0.125 MG tablet Take 1 tablet (0.125 mg total) by mouth daily. 12/30/18   Marguerita Merles Latif, DO  Milrinone Lactate 50 MG/50ML SOLN Inject 0.5 mcg/kg/min into the vein continuous. Weight 65.5 kg Pump rate 2 ml/hr    [provider]  Multiple Vitamin (MULTIVITAMIN WITH MINERALS) TABS tablet Take 1 tablet by mouth daily.    [provider]  potassium chloride SA (KLOR-CON) 20 MEQ tablet Take 1 tablet (20 mEq total) by mouth daily. 06/25/19   Clegg, Amy D, NP  torsemide (DEMADEX) 20 MG tablet Take 3 tablets (60 mg total) by mouth daily. 06/25/19   Tonye Becket D, NP  warfarin (COUMADIN) 5 MG tablet Take as directed by the coumadin clinic 08/14/19   Iyanna Drummer, Bevelyn Buckles, MD    Past Medical History: Past Medical History:  Diagnosis Date  . Asthma   . Chronic systolic CHF (congestive heart failure) (HCC)   . Cigarette smoker   . CKD (chronic kidney disease), stage II     [name] 10/01/2017  . COPD (chronic obstructive pulmonary disease) (HCC) 10/21/2017   on CT scan chest  . History of echocardiogram    a. Echo 5/17 - EF 20-25%, severe diff HK, restrictive physiology, mild to mod Mark, severe reduced RVSF, mod RVE, mild RAE, mod TR, PASP 48 mmHg  . Hx of cardiac cath    a. LHC 5/17 - normal coronary arteries. PA 45/25, mean 33, PCWP mean 18  . NICM (nonischemic cardiomyopathy) (HCC)   . Stroke (HCC) 09/27/2017   "was weak on my left side; I'm fully recovered" (11/09/2017)  . Substance abuse (HCC)    cocaine, marijuana    Past Surgical History: Past Surgical History:  Procedure Laterality Date  . CARDIAC CATHETERIZATION N/A 01/05/2016   Procedure: Right/Left Heart Cath and Coronary Angiography;  Surgeon: Lennette Bihari, MD;  Location: [facility]  INVASIVE CV LAB;  Service: Cardiovascular;  Laterality: N/A;  . MULTIPLE EXTRACTIONS WITH ALVEOLOPLASTY Bilateral 04/11/2019   Procedure: Extraction of tooth #'s 2, 4, 5, 10, and 12 with alveoloplasty;  Surgeon: Lenn Cal, DDS;  Location: [place];  Service: Oral Surgery;  Laterality: Bilateral;  . RIGHT HEART CATH N/A 04/09/2019   Procedure: RIGHT HEART CATH;  Surgeon: Jolaine Artist, MD;  Location: [place] CV LAB;  Service: Cardiovascular;  Laterality: N/A;  . RIGHT HEART CATH N/A 06/17/2019   Procedure: RIGHT HEART CATH;  Surgeon: Jolaine Artist, MD;  Location: [place] CV LAB;  Service: Cardiovascular;  Laterality: N/A;    Family History: Family History  Problem Relation Age of Onset  . Cardiomyopathy Father        Reports his father has an LVAD  . Heart failure Father   . Hypertension Father   . Heart disease Maternal Grandmother        had a whole in her heart  . Deep vein thrombosis Neg Hx     Social History: Social History   Socioeconomic History  . Marital status: Divorced    Spouse name: Not on file  . Number of children: Not on file  . Years of education: Not on file  . Highest  education level: Not on file  Occupational History  . Not on file  Tobacco Use  . Smoking status: Former Smoker    Packs/day: 1.00    Years: 30.00    Pack years: 30.00    Types: Cigarettes    Quit date: 09/27/2017    Years since quitting: 1.9  . Smokeless tobacco: Never Used  Substance and Sexual Activity  . Alcohol use: Yes    Alcohol/week: 5.0 standard drinks    Types: 5 Shots of liquor per week    Comment: 5-6 shots of vodka daily; "stopped it all after I had stroke 09/27/2017"  . Drug use: Yes    Types: Cocaine, Marijuana    Comment: 11/09/2017 "none since 09/27/2017"  . Sexual activity: Not Currently  Other Topics Concern  . Not on file  Social History Narrative  . Not on file   Social Determinants of Health   Financial Resource Strain: Medium Risk  . Difficulty of Paying Living Expenses: Somewhat hard  Food Insecurity: Food Insecurity Present  . Worried About Charity fundraiser in the Last Year: Sometimes true  . Ran Out of Food in the Last Year: Sometimes true  Transportation Needs: No Transportation Needs  . Lack of Transportation (Medical): No  . Lack of Transportation (Non-Medical): No  Physical Activity: Inactive  . Days of Exercise per Week: 0 days  . Minutes of Exercise per Session: 0 min  Stress: Stress Concern Present  . Feeling of Stress : To some extent  Social Connections: Somewhat Isolated  . Frequency of Communication with Friends and Family: More than three times a week  . Frequency of Social Gatherings with Friends and Family: More than three times a week  . Attends Religious Services: 1 to 4 times per year  . Active Member of Clubs or Organizations: No  . Attends Archivist Meetings: Never  . Marital Status: Divorced    Allergies:  Allergies  Allergen Reactions  . Bactrim [Sulfamethoxazole-Trimethoprim] Swelling and Rash    Facial swelling  . Clindamycin/Lincomycin Swelling and Palpitations  . Hydrocodone Hives  . Lasix [Furosemide]  Hives and Swelling    Facial swelling Reaction to name brand LASIX  .  Lisinopril Swelling and Other (See Comments)    Facial swelling/angioedema  . Olopatadine Hcl Swelling    Makes his face swell up  . Penicillins Hives and Swelling     Has patient had a PCN reaction causing immediate rash, facial/tongue/throat swelling, SOB or lightheadedness with hypotension: Yes Has patient had a PCN reaction causing severe rash involving mucus membranes or skin necrosis: No Has patient had a PCN reaction that required hospitalization: No Has patient had a PCN reaction occurring within the last 10 years: No If all of the above answers are "NO", then may proceed with Cephalosporin use.   . Prednisone Shortness Of Breath, Nausea Only, Swelling and Other (See Comments)    Also made chest feel tight and genital area, legs, and face became swollen badly    Objective:    Vital Signs:   Temp:  [97.9 F (36.6 C)-98.6 F (37 C)] 98.6 F (37 C) (01/05 0535) Pulse Rate:  [63-119] 119 (01/05 0939) Resp:  [10-19] 17 (01/05 0939) BP: (93-128)/(64-110) 120/90 (01/05 0535) SpO2:  [93 %-100 %] 93 % (01/05 0939)    Weight change: There were no vitals filed for this visit.  Intake/Output:  No intake or output data in the 24 hours ending 08/27/19 1018    Physical Exam    General:  SOB at rest HEENT: normal Neck: supple. JVP to jaw . Carotids 2+ bilat; no bruits. No lymphadenopathy or thyromegaly appreciated. Cor: PMI nondisplaced. Regular rate & rhythm. No rubs, or murmurs. +S 3  Lungs: clear Abdomen: soft, nontender, nondistended. No hepatosplenomegaly. No bruits or masses. Good bowel sounds. Extremities: no cyanosis, clubbing, rash, R and LLE 3+ edema. RUE PICC  Neuro: alert & orientedx3, cranial nerves grossly intact. moves all 4 extremities w/o difficulty. Affect pleasant   Telemetry   Sinus Tach 120s personally reviewed.   EKG    Sinus Tach 117 bpm   Labs   Basic Metabolic  Panel: Recent Labs  Lab 09/09/19 1716  NA 127*  K 4.3  CL 87*  CO2 27  GLUCOSE 100*  BUN 49*  CREATININE 1.85*  CALCIUM 9.2    Liver Function Tests: No results for input(s): AST, ALT, ALKPHOS, BILITOT, PROT, ALBUMIN in the last 168 hours. No results for input(s): LIPASE, AMYLASE in the last 168 hours. No results for input(s): AMMONIA in the last 168 hours.  CBC: Recent Labs  Lab Sep 09, 2019 1716  WBC 5.4  HGB 12.9*  HCT 38.0*  MCV 75.1*  PLT 145*    Cardiac Enzymes: No results for input(s): CKTOTAL, CKMB, CKMBINDEX, TROPONINI in the last 168 hours.  BNP: BNP (last 3 results) Recent Labs    06/09/19 1140 06/19/19 0516 08/27/19 0858  BNP 2,175.3* 2,045.3* 1,504.3*    ProBNP (last 3 results) No results for input(s): PROBNP in the last 8760 hours.   CBG: No results for input(s): GLUCAP in the last 168 hours.  Coagulation Studies: Recent Labs    08/27/19 0858  LABPROT 38.7*  INR 4.0*     Imaging   DG Chest 2 View  Result Date: 09-Sep-2019 CLINICAL DATA:  Shortness of breath and chest pain EXAM: CHEST - 2 VIEW COMPARISON:  07/24/2019 FINDINGS: Previously seen right-sided PICC line is again noted in the mid superior vena cava. Cardiac shadow is enlarged. The lungs are hyperinflated without focal infiltrate or sizable effusion. No acute bony abnormality is seen. IMPRESSION: COPD without acute abnormality. Electronically Signed   By: Alcide Clever M.D.   On: 2019-09-09  17:03      Medications:     Current Medications: . furosemide  80 mg Intravenous Once  . sodium chloride flush  3 mL Intravenous Once     Infusions:      Assessment/Plan   1. 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  -On chronic milrinone 0.5 mcg. Admitting with marked volume overload. BNP 1500.  -Received 80 mg IV lasix x1 this morning. Start lasix drip at 12 mg per hour.  -No BB, spiro, dig, arb  end stage HF, AKI,  and chronic  hypotension.  -He is not a candidate for LVAD at [facility] due to RV failure, poor PFTs, and social barriers.    2. AKI  -Creatinine on admit up to 1.8. Suspect recurrent shock in the setting of end stage HF.   3. Polysubstance Abuse - Check UDS.   4. DNR/DNI  Followed by [facility]. Will diurese and send home 24-48 hours.  I discussed GOC with him. He wants to be DNR/DNI.  - I will reach out to [facility].  Admit to telemetry and diurese for a few days. Resume Hospice at discharge.    Length of Stay: 0  Tonye Becket, NP  08/27/2019, 10:18 AM  Advanced Heart Failure Team Pager [phone] (M-F; 7a - 4p)  Please contact [facility] for night-coverage after hours (4p -7a ) and weekends on [email or web address]  Patient seen and examined with the above-signed Advanced Practice Provider and/or Housestaff. I personally reviewed laboratory data, imaging studies and relevant notes. I independently examined the patient and formulated the important aspects of the plan. I have edited the note to reflect any of my changes or salient points. I have personally discussed the plan with the patient and/or family.  45 y/o male with end-stage systolic biventricular HF due to NICM. Now on home milrinone for palliative purposes. Presents to ER with significant volume overload after being off meds. Situation c/b AKI.  UDS not surprisingly +  He is not candidate for advanced therapies.  Unable to provide IV lasix at home.   We discussed situation and now wanting to change Code Status to Hospice.   On exam Weak appearing JVP to ear Cor Reg + s3 2/6 Mark/TR Ab distended Ext 3+ edema Neuro non-focal  Will admit for palliative for IV diuresis unless Hospice Team can provide IV lasix.   Arvilla Meres, MD  11:59 AM

## 2019-08-27 NOTE — ED Notes (Signed)
Patient is on NIV at this time tolerating it well. Settings were adjusted per patient home regimen. No complications noted

## 2019-08-27 NOTE — ED Notes (Signed)
Lunch Tray Ordered @ 1422.

## 2019-08-28 LAB — BASIC METABOLIC PANEL
Anion gap: 11 (ref 5–15)
BUN: 43 mg/dL — ABNORMAL HIGH (ref 6–20)
CO2: 27 mmol/L (ref 22–32)
Calcium: 9.1 mg/dL (ref 8.9–10.3)
Chloride: 91 mmol/L — ABNORMAL LOW (ref 98–111)
Creatinine, Ser: 1.62 mg/dL — ABNORMAL HIGH (ref 0.61–1.24)
GFR calc Af Amer: 59 mL/min — ABNORMAL LOW (ref >60)
GFR calc non Af Amer: 51 mL/min — ABNORMAL LOW (ref >60)
Glucose, Bld: 131 mg/dL — ABNORMAL HIGH (ref 70–99)
Potassium: 4.3 mmol/L (ref 3.5–5.1)
Sodium: 129 mmol/L — ABNORMAL LOW (ref 135–145)

## 2019-08-28 LAB — PROTIME-INR
INR: 5.7 (ref 0.8–1.2)
Prothrombin Time: 51.4 seconds — ABNORMAL HIGH (ref 11.4–15.2)

## 2019-08-28 LAB — MAGNESIUM: Magnesium: 1.8 mg/dL (ref 1.7–2.4)

## 2019-08-28 MED ORDER — FENTANYL CITRATE (PF) 100 MCG/2ML IJ SOLN
25.0000 ug | Freq: Once | INTRAMUSCULAR | Status: AC
  Administered 2019-08-28: 25 ug via INTRAVENOUS
  Filled 2019-08-28: qty 2

## 2019-08-28 MED ORDER — LORATADINE 10 MG PO TABS
10.0000 mg | ORAL_TABLET | Freq: Every day | ORAL | Status: DC
  Administered 2019-08-29: 10 mg via ORAL
  Filled 2019-08-28: qty 1

## 2019-08-28 MED ORDER — FENTANYL CITRATE (PF) 100 MCG/2ML IJ SOLN
50.0000 ug | INTRAMUSCULAR | Status: DC | PRN
  Administered 2019-08-28 – 2019-08-29 (×2): 50 ug via INTRAVENOUS
  Filled 2019-08-28 (×2): qty 2

## 2019-08-28 MED ORDER — MAGNESIUM SULFATE 2 GM/50ML IV SOLN
2.0000 g | Freq: Once | INTRAVENOUS | Status: DC
  Filled 2019-08-28: qty 50

## 2019-08-28 NOTE — Progress Notes (Signed)
ANTICOAGULATION CONSULT NOTE - Initial Consult  Pharmacy Consult for warfarin Indication: apical mural thrombus  Allergies  Allergen Reactions  . Bactrim [Sulfamethoxazole-Trimethoprim] Swelling and Rash    Facial swelling  . Clindamycin/Lincomycin Swelling and Palpitations  . Hydrocodone Hives  . Lasix [Furosemide] Hives and Swelling    Facial swelling Reaction to name brand LASIX  . Lisinopril Swelling and Other (See Comments)    Facial swelling/angioedema  . Olopatadine Hcl Swelling    Makes his face swell up  . Penicillins Hives and Swelling     Has patient had a PCN reaction causing immediate rash, facial/tongue/throat swelling, SOB or lightheadedness with hypotension: Yes Has patient had a PCN reaction causing severe rash involving mucus membranes or skin necrosis: No Has patient had a PCN reaction that required hospitalization: No Has patient had a PCN reaction occurring within the last 10 years: No If all of the above answers are "NO", then may proceed with Cephalosporin use.   . Prednisone Shortness Of Breath, Nausea Only, Swelling and Other (See Comments)    Also made chest feel tight and genital area, legs, and face became swollen badly    Patient Measurements: Weight: 142 lb (64.4 kg)  Vital Signs: BP: 105/90 (01/06 0815) Pulse Rate: 119 (01/06 0815)  Labs: Recent Labs    08/28/2019 1716 08/27/19 0858 08/27/19 1130 08/28/19 0500  HGB 12.9*  --   --   --   HCT 38.0*  --   --   --   PLT 145*  --   --   --   LABPROT  --  38.7*  --  51.4*  INR  --  4.0*  --  5.7*  CREATININE 1.85*  --   --  1.62*  TROPONINIHS 70* 56* 63*  --     Estimated Creatinine Clearance: 52.5 mL/min (A) (by C-G formula based on SCr of 1.62 mg/dL (H)).   Medical History: Past Medical History:  Diagnosis Date  . Asthma   . Chronic systolic CHF (congestive heart failure) ([place])   . Cigarette smoker   . CKD (chronic kidney disease), stage II    Archie Endo 10/01/2017  . COPD (chronic  obstructive pulmonary disease) ([place]) 10/21/2017   on CT scan chest  . History of echocardiogram    a. Echo 5/17 - EF 20-25%, severe diff HK, restrictive physiology, mild to mod MR, severe reduced RVSF, mod RVE, mild RAE, mod TR, PASP 48 mmHg  . Hx of cardiac cath    a. LHC 5/17 - normal coronary arteries. PA 45/25, mean 33, PCWP mean 18  . NICM (nonischemic cardiomyopathy) ([place])   . Stroke ([place]) 09/27/2017   "was weak on my left side; I'm fully recovered" (11/09/2017)  . Substance abuse (HCC)    cocaine, marijuana    Medications:  Scheduled:  . Chlorhexidine Gluconate Cloth  6 each Topical Daily  . sodium chloride flush  3 mL Intravenous Once  . sodium chloride flush  3 mL Intravenous Q12H  . Warfarin - Pharmacist Dosing Inpatient   Does not apply q1800    Assessment: 72 yom presenting with SOB and leg edema. On warfarin PTA - LD 1/3.   INR at admission is 4. INR increased further to 5.7 - warfrain held 1/6. Hgb 12.9, plt 145 - on last check 1/4. No s/sx of bleeding.   PTA regimen is 5 mg daily except 2.5 mg TTSat.   Goal of Therapy:  Heparin level 0.3-0.7 units/ml Monitor platelets by anticoagulation protocol: Yes  Plan:  Hold warfarin tonight Monitor daily INR, CBC, and for s/sx of bleeding.   Sherron Monday, PharmD, BCCCP Clinical Pharmacist  Phone: [phone]  Please check AMION for all [facility] Pharmacy phone numbers After 10:00 PM, call Main Pharmacy [phone] 08/28/2019,9:47 AM

## 2019-08-28 NOTE — ED Notes (Signed)
MD paged and is aware of pt 8/10 chest pain and heart rate in 120's

## 2019-08-28 NOTE — ED Notes (Signed)
Pt vomited on floor 

## 2019-08-28 NOTE — Progress Notes (Addendum)
Advanced Heart Failure Rounding Note  PCP-Cardiologist: Lauree Chandler, MD   Subjective:   Yesterday diuresed with lasix drip 12 mg per hour. He remains on milrinone 0.5 mcg.   UDS +cocaine.   Complaining shortness of breath and chest pain. Placed on Bipap this morning. .   Objective:   Weight Range: 64.4 kg Body mass index is 20.97 kg/m.   Vital Signs:   Pulse Rate:  [62-136] 119 (01/06 0815) Resp:  [15-26] 17 (01/06 0815) BP: (99-134)/(79-101) 105/90 (01/06 0815) SpO2:  [90 %-100 %] 95 % (01/06 0815) Weight:  [64.4 kg] 64.4 kg (01/05 1415)    Weight change: Filed Weights   08/27/19 1415  Weight: 64.4 kg    Intake/Output:  No intake or output data in the 24 hours ending 08/28/19 0945    Physical Exam    General:  On Bipap  HEENT: Normal Neck: Supple. JVP to jaw . Carotids 2+ bilat; no bruits. No lymphadenopathy or thyromegaly appreciated. Cor: PMI nondisplaced. Tachy/Regular rate & rhythm. No rubs, or murmurs. +S3  Lungs: Clear Abdomen: Soft, nontender, nondistended. No hepatosplenomegaly. No bruits or masses. Good bowel sounds. Extremities: No cyanosis, clubbing, rash, R and LLE 1-2+ edema. RUE PICC Neuro: Alert & orientedx3, cranial nerves grossly intact. moves all 4 extremities w/o difficulty. Affect pleasant   Telemetry   Sinus Tach 110-120s   EKG    N/A   Labs    CBC Recent Labs    09/20/2019 1716  WBC 5.4  HGB 12.9*  HCT 38.0*  MCV 75.1*  PLT 893*   Basic Metabolic Panel Recent Labs    09/16/2019 1716 08/28/19 0500  NA 127* 129*  K 4.3 4.3  CL 87* 91*  CO2 27 27  GLUCOSE 100* 131*  BUN 49* 43*  CREATININE 1.85* 1.62*  CALCIUM 9.2 9.1  MG  --  1.8   Liver Function Tests No results for input(s): AST, ALT, ALKPHOS, BILITOT, PROT, ALBUMIN in the last 72 hours. No results for input(s): LIPASE, AMYLASE in the last 72 hours. Cardiac Enzymes No results for input(s): CKTOTAL, CKMB, CKMBINDEX, TROPONINI in the last 72  hours.  BNP: BNP (last 3 results) Recent Labs    06/09/19 1140 06/19/19 0516 08/27/19 0858  BNP 2,175.3* 2,045.3* 1,504.3*    ProBNP (last 3 results) No results for input(s): PROBNP in the last 8760 hours.   D-Dimer No results for input(s): DDIMER in the last 72 hours. Hemoglobin A1C No results for input(s): HGBA1C in the last 72 hours. Fasting Lipid Panel No results for input(s): CHOL, HDL, LDLCALC, TRIG, CHOLHDL, LDLDIRECT in the last 72 hours. Thyroid Function Tests No results for input(s): TSH, T4TOTAL, T3FREE, THYROIDAB in the last 72 hours.  Invalid input(s): FREET3  Other results:   Imaging     No results found.   Medications:     Scheduled Medications: . Chlorhexidine Gluconate Cloth  6 each Topical Daily  . sodium chloride flush  3 mL Intravenous Once  . sodium chloride flush  3 mL Intravenous Q12H  . Warfarin - Pharmacist Dosing Inpatient   Does not apply q1800     Infusions: . sodium chloride    . furosemide (LASIX) infusion 12 mg/hr (08/27/19 1435)  . milrinone 0.5 mcg/kg/min (08/27/19 1608)     PRN Medications:  sodium chloride, acetaminophen, albuterol, ondansetron (ZOFRAN) IV, sodium chloride flush, sodium chloride flush    Assessment/Plan   1. 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  -On chronic milrinone 0.5 mcg. Admitting with marked volume overload. BNP 1500.   - Having chest discomfort suspect demand ischemia. Will need to start prn pain medications.  -Volume status a little better. Continue lasix drip 12 mg per hour.  -No BB, spiro, dig, arb  end stage HF, AKI,  and chronic hypotension.  -He is not a candidate for LV[facility] at [facility] to RV failure, poor PFTs, and social barriers.    2. AKI  -Creatinine on admit up to 1.8.  - Suspect recurrent shock in the setting of end stage HF.   3. Polysubstance Abuse -UDS + cocaine.    4. DNR/DNI  Followed by [facility].  Will diurese and send home 24-48 hours.  I discussed GOC with him. He wants to be DNR/DNI.   I personally called [facility] to discuss admit. [place] has been difficult to manage due to noncompliance and ongoing drug use. They are considering discharging due to noncompliance and drug use. Will ask Palliative Care to consult in the hospital. He has end stage biventricular heart failure with no options for advanced therapies. I am concerned about his discharge with PICC and drug use. Risk of infection from line is high and without milrinone he will likely have days to live.   Waiting on 3E bed.    Length of Stay: 1  Amy Clegg, NP  08/28/2019, 9:45 AM  Advanced Heart Failure Team Pager [phone] (M-F; 7a - 4p)  Please contact [facility] for night-coverage after hours (4p -7a ) and weekends on [email or web address]  Patient seen and examined with the above-signed Advanced Practice Provider and/or Housestaff. I personally reviewed laboratory data, imaging studies and relevant notes. I independently examined the patient and formulated the important aspects of the plan. I have edited the note to reflect any of my changes or salient points. I have personally discussed the plan with the patient and/or family.  Remains tenuous with end-stage HF. Still quite symptomatic. Remains on his home milrinone at 0.5. On lasix gtt and starting to diuresis but still markedly overloaded. UDS again positive for cocaine. Continues with CP and asking for pain meds. INR 5.7 but no bleeding.   On exam Weak and ill appearing JVP to ear Cor tachy regular + s3 Lungs crackles  Ex 2-3+ edema   Continue palliative milrinone. Lasix gtt. Not candidate for advanced therapies.   Arvilla Meres, MD  2:48 PM

## 2019-08-28 NOTE — ED Notes (Signed)
Lunch Ordered @ 1346.

## 2019-08-28 NOTE — Progress Notes (Signed)
RT found pt already on CPAP dream station with settings of 10 cmH2O w/10 Lpm bled into the system. Pt tolerating. RT will continue to monitor.

## 2019-08-29 ENCOUNTER — Encounter (HOSPITAL_COMMUNITY): Payer: Self-pay | Admitting: Internal Medicine

## 2019-08-29 DIAGNOSIS — R570 Cardiogenic shock: Secondary | ICD-10-CM

## 2019-08-29 LAB — BASIC METABOLIC PANEL
Anion gap: 15 (ref 5–15)
BUN: 52 mg/dL — ABNORMAL HIGH (ref 6–20)
CO2: 28 mmol/L (ref 22–32)
Calcium: 9.5 mg/dL (ref 8.9–10.3)
Chloride: 86 mmol/L — ABNORMAL LOW (ref 98–111)
Creatinine, Ser: 1.97 mg/dL — ABNORMAL HIGH (ref 0.61–1.24)
GFR calc Af Amer: 46 mL/min — ABNORMAL LOW (ref >60)
GFR calc non Af Amer: 40 mL/min — ABNORMAL LOW (ref >60)
Glucose, Bld: 102 mg/dL — ABNORMAL HIGH (ref 70–99)
Potassium: 5 mmol/L (ref 3.5–5.1)
Sodium: 129 mmol/L — ABNORMAL LOW (ref 135–145)

## 2019-08-29 LAB — COOXEMETRY PANEL
Carboxyhemoglobin: 1.9 % — ABNORMAL HIGH (ref 0.5–1.5)
Methemoglobin: 0.9 % (ref 0.0–1.5)
O2 Saturation: 49 %
Total hemoglobin: 12.1 g/dL (ref 12.0–16.0)

## 2019-08-29 LAB — HEPATIC FUNCTION PANEL
ALT: 34 U/L (ref 0–44)
AST: 64 U/L — ABNORMAL HIGH (ref 15–41)
Albumin: 3 g/dL — ABNORMAL LOW (ref 3.5–5.0)
Alkaline Phosphatase: 96 U/L (ref 38–126)
Bilirubin, Direct: 2.7 mg/dL — ABNORMAL HIGH (ref 0.0–0.2)
Indirect Bilirubin: 2.8 mg/dL — ABNORMAL HIGH (ref 0.3–0.9)
Total Bilirubin: 5.5 mg/dL — ABNORMAL HIGH (ref 0.3–1.2)
Total Protein: 7.8 g/dL (ref 6.5–8.1)

## 2019-08-29 LAB — LACTIC ACID, PLASMA: Lactic Acid, Venous: 4.5 mmol/L (ref 0.5–1.9)

## 2019-08-29 LAB — PROTIME-INR
INR: 6.9 (ref 0.8–1.2)
Prothrombin Time: 60.1 seconds — ABNORMAL HIGH (ref 11.4–15.2)

## 2019-08-29 MED ORDER — PHYTONADIONE 5 MG PO TABS
2.5000 mg | ORAL_TABLET | Freq: Once | ORAL | Status: AC
  Administered 2019-08-29: 2.5 mg via ORAL
  Filled 2019-08-29: qty 1

## 2019-08-29 MED ORDER — FENTANYL CITRATE (PF) 100 MCG/2ML IJ SOLN: 50.0000 ug | INTRAMUSCULAR | Status: DC | PRN

## 2019-08-29 MED ORDER — MORPHINE 100MG IN NS 100ML (1MG/ML) PREMIX INFUSION
3.0000 mg/h | INTRAVENOUS | Status: DC
  Administered 2019-08-29: 3 mg/h via INTRAVENOUS
  Filled 2019-08-29: qty 100

## 2019-08-29 MED ORDER — FENTANYL 2500MCG IN NS 250ML (10MCG/ML) PREMIX INFUSION
25.0000 ug/h | INTRAVENOUS | Status: DC
  Filled 2019-08-29: qty 250

## 2019-08-29 NOTE — Progress Notes (Signed)
ANTICOAGULATION CONSULT NOTE - Initial Consult  Pharmacy Consult for warfarin Indication: apical mural thrombus  Allergies  Allergen Reactions  . Bactrim [Sulfamethoxazole-Trimethoprim] Swelling and Rash    Facial swelling  . Clindamycin/Lincomycin Swelling and Palpitations  . Hydrocodone Hives  . Lasix [Furosemide] Hives and Swelling    Facial swelling Reaction to name brand LASIX  . Lisinopril Swelling and Other (See Comments)    Facial swelling/angioedema  . Olopatadine Hcl Swelling    Makes his face swell up  . Penicillins Hives and Swelling     Has patient had a PCN reaction causing immediate rash, facial/tongue/throat swelling, SOB or lightheadedness with hypotension: Yes Has patient had a PCN reaction causing severe rash involving mucus membranes or skin necrosis: No Has patient had a PCN reaction that required hospitalization: No Has patient had a PCN reaction occurring within the last 10 years: No If all of the above answers are "NO", then may proceed with Cephalosporin use.   . Prednisone Shortness Of Breath, Nausea Only, Swelling and Other (See Comments)    Also made chest feel tight and genital area, legs, and face became swollen badly    Patient Measurements: Weight: 145 lb 6.4 oz (66 kg)  Vital Signs: Temp: 97.5 F (36.4 C) (01/07 0438) Temp Source: Oral (01/07 0438) BP: 110/77 (01/07 0438) Pulse Rate: 118 (01/07 0438)  Labs: Recent Labs    2019/09/10 1716 08/27/19 0858 08/27/19 1130 08/28/19 0500 08/29/19 0447  HGB 12.9*  --   --   --   --   HCT 38.0*  --   --   --   --   PLT 145*  --   --   --   --   LABPROT  --  38.7*  --  51.4* 60.1*  INR  --  4.0*  --  5.7* 6.9*  CREATININE 1.85*  --   --  1.62* 1.97*  TROPONINIHS 70* 56* 63*  --   --     Estimated Creatinine Clearance: 44.2 mL/min (A) (by C-G formula based on SCr of 1.97 mg/dL (H)).   Medical History: Past Medical History:  Diagnosis Date  . Asthma   . Chronic systolic CHF (congestive  heart failure) (HCC)   . Cigarette smoker   . CKD (chronic kidney disease), stage II    [name] 10/01/2017  . COPD (chronic obstructive pulmonary disease) (HCC) 10/21/2017   on CT scan chest  . History of echocardiogram    a. Echo 5/17 - EF 20-25%, severe diff HK, restrictive physiology, mild to mod MR, severe reduced RVSF, mod RVE, mild RAE, mod TR, PASP 48 mmHg  . Hx of cardiac cath    a. LHC 5/17 - normal coronary arteries. PA 45/25, mean 33, PCWP mean 18  . NICM (nonischemic cardiomyopathy) (HCC)   . Stroke (HCC) 09/27/2017   "was weak on my left side; I'm fully recovered" (11/09/2017)  . Substance abuse (HCC)    cocaine, marijuana    Medications:  Scheduled:  . Chlorhexidine Gluconate Cloth  6 each Topical Daily  . loratadine  10 mg Oral Daily  . phytonadione  2.5 mg Oral Once  . sodium chloride flush  3 mL Intravenous Q12H  . Warfarin - Pharmacist Dosing Inpatient   Does not apply q1800    Assessment: 43 yom presenting with SOB and leg edema. On warfarin PTA - LD 1/3.   INR at admission is 4. INR increased further to 6.9 - warfrain held 1/5-6. Hgb 12.9, plt 145 -  on last check 1/4. No s/sx of bleeding. Vitamin K 2.5 mg ordered.  PTA regimen is 5 mg daily except 2.5 mg TTSat.   Goal of Therapy:  Heparin level 0.3-0.7 units/ml Monitor platelets by anticoagulation protocol: Yes   Plan:  Hold warfarin tonight Monitor daily INR, CBC, and for s/sx of bleeding.   Antonietta Jewel, PharmD, BCCCP Clinical Pharmacist  Phone: [phone]  Please check AMION for all [place] phone numbers After 10:00 PM, call [place] [phone] 08/29/2019,8:46 AM

## 2019-08-29 NOTE — Progress Notes (Addendum)
Pt still complaining of constant pain in chest. of fentanyl given. Refusing to wear CPAP. Pt vomited on paper food tray a moderate amount, unable to assess color. 4mg  Zofran given. MD paged. Continuing to monitor pt.

## 2019-08-29 NOTE — Progress Notes (Signed)
Chaplain prayed with Mark [name].  Mark [name] shared that he felt like he had said all that he needed to say.  Chaplain affirmed that she could hear and see him and was listening to his words.  Chaplain offered prayer of grace and mercy over Mark [name] who accepted prayer.  Mark [name] was grateful for prayer.  Chaplain checked in with Mark [name]'s brother Marita [place] and his girlfriend [place].    Chaplain will follow-up as needed.

## 2019-08-29 NOTE — Progress Notes (Addendum)
Received a call from RN regarding INR 6.9 today. Patient admitted 08/27/2019 and has not had Coumadin this admission. INR 5.7 on admission.  Has not had vitamin K.  Has not had any bleeding issues.  Check liver functions, and continue to follow closely.  Theodore Demark, PA-C 08/29/2019 6:20 AM Beeper [phone]  Spoke w/ DB, give Vit K 2.5 mg po x 1, order written  Theodore Demark, PA-C 08/29/2019 8:06 AM Beeper [phone]

## 2019-08-29 NOTE — Progress Notes (Signed)
Patient experiencing vomiting with PRNs given New orders for Cox and Lactic acid collected and sent.

## 2019-08-29 NOTE — Progress Notes (Signed)
Hand walked Fentanyl to main pharmacy due to being discontinued.

## 2019-08-29 NOTE — Progress Notes (Addendum)
Advanced Heart Failure Rounding Note  PCP-Cardiologist: Lauree Chandler, MD   Subjective:   Remains on milrinone 0.5 mcg + lasix drip 12 mg per hour. Sluggish urine output noted.   Complaining of nausea.  Objective:   Weight Range: 66 kg Body mass index is 21.47 kg/m.   Vital Signs:   Temp:  [97.5 F (36.4 C)] 97.5 F (36.4 C) (01/07 0438) Pulse Rate:  [113-124] 118 (01/07 0438) Resp:  [18-19] 18 (01/07 0438) BP: (99-110)/(77-90) 110/77 (01/07 0438) SpO2:  [85 %-100 %] 94 % (01/07 0438) FiO2 (%):  [60 %] 60 % (01/06 2217) Weight:  [66 kg] 66 kg (01/07 0441)    Weight change: Filed Weights   08/27/19 1415 08/29/19 0441  Weight: 64.4 kg 66 kg    Intake/Output:   Intake/Output Summary (Last 24 hours) at 08/29/2019 0852 Last data filed at 08/29/2019 0600 Gross per 24 hour  Intake 720 ml  Output 325 ml  Net 395 ml      Physical Exam    General: Vomiting.   No resp difficulty HEENT: normal Neck: supple. JVP to jaw . Carotids 2+ bilat; no bruits. No lymphadenopathy or thryomegaly appreciated. Cor: PMI nondisplaced. Regular rate & rhythm. No rubs, or murmurs. +S3 Lungs: clear Abdomen: soft, nontender, distended. No hepatosplenomegaly. No bruits or masses. Good bowel sounds. Extremities: no cyanosis, clubbing, rash, edema. RUE PICC Neuro: alert & orientedx3, cranial nerves grossly intact. moves all 4 extremities w/o difficulty. Affect flat   Telemetry   Sinus Tach 110-120s   EKG    N/A   Labs    CBC Recent Labs    08/29/2019 1716  WBC 5.4  HGB 12.9*  HCT 38.0*  MCV 75.1*  PLT 341*   Basic Metabolic Panel Recent Labs    08/28/19 0500 08/29/19 0447  NA 129* 129*  K 4.3 5.0  CL 91* 86*  CO2 27 28  GLUCOSE 131* 102*  BUN 43* 52*  CREATININE 1.62* 1.97*  CALCIUM 9.1 9.5  MG 1.8  --    Liver Function Tests Recent Labs    08/29/19 0622  AST 64*  ALT 34  ALKPHOS 96  BILITOT 5.5*  PROT 7.8  ALBUMIN 3.0*   No results for input(s):  LIPASE, AMYLASE in the last 72 hours. Cardiac Enzymes No results for input(s): CKTOTAL, CKMB, CKMBINDEX, TROPONINI in the last 72 hours.  BNP: BNP (last 3 results) Recent Labs    06/09/19 1140 06/19/19 0516 08/27/19 0858  BNP 2,175.3* 2,045.3* 1,504.3*    ProBNP (last 3 results) No results for input(s): PROBNP in the last 8760 hours.   D-Dimer No results for input(s): DDIMER in the last 72 hours. Hemoglobin A1C No results for input(s): HGBA1C in the last 72 hours. Fasting Lipid Panel No results for input(s): CHOL, HDL, LDLCALC, TRIG, CHOLHDL, LDLDIRECT in the last 72 hours. Thyroid Function Tests No results for input(s): TSH, T4TOTAL, T3FREE, THYROIDAB in the last 72 hours.  Invalid input(s): FREET3  Other results:   Imaging    No results found.   Medications:     Scheduled Medications: . Chlorhexidine Gluconate Cloth  6 each Topical Daily  . loratadine  10 mg Oral Daily  . phytonadione  2.5 mg Oral Once  . sodium chloride flush  3 mL Intravenous Q12H  . Warfarin - Pharmacist Dosing Inpatient   Does not apply q1800    Infusions: . sodium chloride    . furosemide (LASIX) infusion 12 mg/hr (08/28/19 1147)  .  magnesium sulfate bolus IVPB    . milrinone 0.5 mcg/kg/min (08/28/19 2355)    PRN Medications: sodium chloride, acetaminophen, albuterol, fentaNYL (SUBLIMAZE) injection, ondansetron (ZOFRAN) IV, sodium chloride flush, sodium chloride flush    Assessment/Plan   1. 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  -On chronic milrinone 0.5 mcg. Admitting with marked volume overload. BNP 1500.   - Severe N/V today likely due to recurrent cardiogenic shock. Check CO-OX + lactic acid now.  We have not other options.  - Volume status elevated with sluggish urine output and worsening renal function.  - Continue lasix drip 12 mg per houyr -No BB, spiro, dig, arb  end stage HF, AKI,  and chronic hypotension.   -He is not a candidate for LV[facility] at [facility] to RV failure, poor PFTs, and social barriers.    2. AKI  -Creatinine on admit up to 1.8.  - Today creatinine trending up 1.8>1.6>1.9  - Suspect recurrent shock in the setting of end stage HF.   3. Polysubstance Abuse -UDS + cocaine.    4. Hyponatremia  Sodium 129.   5. DNR/DNI  Followed by [facility]. Will diurese and send home 24-48 hours.  I discussed GOC with him. He wants to be DNR/DNI.   I called at his request and discussed his deterioration with his girlfriend, [place]. Discussed transitioning to full comfort care. He would like to think about it.   Length of Stay: 2  Tonye Becket, NP  08/29/2019, 8:52 AM  Advanced Heart Failure Team Pager [phone] (M-F; 7a - 4p)  Please contact [facility] for night-coverage after hours (4p -7a ) and weekends on [email or web address]  Agree with above.  He is nauseated and very lethargic despite high-dose inotrope support. Not diuresing on lasix gtt.  Co-ox 49% lactate pending  Exam Terminally ill appearing JVP to ear Cor tachy regular +s3 Lungs + crackles Ab distended Ext 2+ edema cool  He is actively dying from end-stage HF despite milrinone support. He is on Hospice. Long discussion about initiation of comfort measures with morphine gtt. He says he is not ready for morphine just yet. Wants to see his kids. We called his girlfriend and updated her. Suspect he will pass in next few days. He is DNR/DNI. No further options to treat his HF.   CRITICAL CARE Performed by: Arvilla Meres  Total critical care time: 35 minutes  Critical care time was exclusive of separately billable procedures and treating other patients.  Critical care was necessary to treat or prevent imminent or life-threatening deterioration.  Critical care was time spent personally by me (independent of midlevel providers or residents) on the following activities: development of treatment plan with patient and/or  surrogate as well as nursing, discussions with consultants, evaluation of patient's response to treatment, examination of patient, obtaining history from patient or surrogate, ordering and performing treatments and interventions, ordering and review of laboratory studies, ordering and review of radiographic studies, pulse oximetry and re-evaluation of patient's condition.  Arvilla Meres, MD  9:46 AM

## 2019-08-29 NOTE — Progress Notes (Signed)
   08/29/19 1414  MEWS Score  Resp 18  Pulse Rate (!) 118  BP 103/77  Temp (!) 97.4 F (36.3 C)  Level of Consciousness Alert  SpO2 100 %  O2 Device Room Air  MEWS Score  MEWS RR 0  MEWS Pulse 2  MEWS Systolic 0  MEWS LOC 0  MEWS Temp 0  MEWS Score 2  MEWS Score Color Yellow  MEWS Assessment  Is this an acute change? No   HR elevated since admit; MD aware

## 2019-08-29 NOTE — Progress Notes (Addendum)
  I spoke with [name] and his brother [name] at that bedside regarding Mark [name] condition.  We discussed that he has limited time and is dying from his heart failure. [name] said he wants to go home and die with his children at home.   We discussed possible possible death at home but [name] does not want her last memory to be of him dying at home. She does not have the ability to provide 24 hour care. His brother [name] and [name] felt like discharge to home is not an option and they prefer [name] death.   We are giving him fentanyl and zofran as needed for SOB/nausea. Mark [name] does not want continuous pain medications at this time. I doubt he would make it to Mark [name] and he is not interested in that option.   Palliative Care has been consulted. Provided emotional support. We should be abe to get 2 of his children up to see him.   Chaplain consulted.   [name] NP-C  3:27 PM  Mark [name] complaining of pain. He met with chaplain and has said his goodbye to his girlfriend. Requesting pain medications. Start morphine drip at 3 mg per hour for comfort care.  Anticipate [name] death.   [name] NP-C  4:03 PM

## 2019-08-30 ENCOUNTER — Encounter (HOSPITAL_COMMUNITY): Payer: Self-pay | Admitting: Internal Medicine

## 2019-08-30 DIAGNOSIS — Z515 Encounter for palliative care: Secondary | ICD-10-CM

## 2019-08-30 DIAGNOSIS — R0600 Dyspnea, unspecified: Secondary | ICD-10-CM

## 2019-08-30 DIAGNOSIS — K117 Disturbances of salivary secretion: Secondary | ICD-10-CM

## 2019-08-30 MED ORDER — DIPHENHYDRAMINE HCL 50 MG/ML IJ SOLN: 12.5000 mg | Freq: Four times a day (QID) | INTRAMUSCULAR | Status: DC | PRN

## 2019-08-30 MED ORDER — LORAZEPAM 2 MG/ML IJ SOLN: 0.5000 mg | Freq: Four times a day (QID) | INTRAMUSCULAR | Status: DC | PRN

## 2019-08-30 MED ORDER — GLYCOPYRROLATE 0.2 MG/ML IJ SOLN
0.2000 mg | INTRAMUSCULAR | Status: DC | PRN
  Filled 2019-08-30: qty 1

## 2019-08-30 MED ORDER — GLYCOPYRROLATE 0.2 MG/ML IJ SOLN
0.2000 mg | INTRAMUSCULAR | Status: DC
  Filled 2019-08-30 (×6): qty 1

## 2019-08-30 MED ORDER — MORPHINE BOLUS VIA INFUSION
1.0000 mg | INTRAVENOUS | Status: DC | PRN
  Filled 2019-08-30: qty 2

## 2019-09-09 ENCOUNTER — Encounter (HOSPITAL_COMMUNITY): Payer: Medicaid Other

## 2019-09-23 NOTE — Progress Notes (Signed)
Daily Progress Note   Patient Name: Mark [name]       Date: 09-21-19 DOB: Aug 31, 1975  Age: 44 y.o. MRN#: [id number] Attending Physician: Dolores Patty, MD Primary Care Physician: Grayce Sessions, NP Admit Date: 09/05/2019  Reason for Consultation/Follow-up: Terminal Care  Subjective: Patient unresponsive to sternal rub. Audible secretions. Intermittent gasping--This NP gave 2mg  morphine bolus via infusion at bedside that was effective. Morphine 3mg /hr continuous infusion.   No family at bedside. Called significant other, , to give update. Discussed comfort measures and comfort medications. Answered questions. Emotional/spiritual support provided.   Length of Stay: 3  Current Medications: Scheduled Meds:  . glycopyrrolate  0.2 mg Intravenous Q4H  . sodium chloride flush  3 mL Intravenous Q12H    Continuous Infusions: . sodium chloride    . morphine 3 mg/hr (08/29/19 1854)    PRN Meds: sodium chloride, diphenhydrAMINE, glycopyrrolate, LORazepam, morphine, ondansetron (ZOFRAN) IV, sodium chloride flush, sodium chloride flush  Physical Exam Vitals and nursing note reviewed.  Constitutional:      Comments: Unresponsive   Cardiovascular:     Rate and Rhythm: Regular rhythm.  Pulmonary:     Effort: No tachypnea, accessory muscle usage or respiratory distress.     Breath sounds: Rhonchi present.     Comments: Audible secretions. Scheduled robinul Skin:    General: Skin is warm and dry.  Neurological:     Mental Status: He is unresponsive.            Vital Signs: BP 96/77 (BP Location: Left Arm)   Pulse (!) 114   Temp (!) 97.4 F (36.3 C) (Oral)   Resp 18   Wt 66 kg   SpO2 (!) 85%   BMI 21.47 kg/m  SpO2: SpO2: (!) 85 % O2 Device: O2 Device: Room  Air O2 Flow Rate: O2 Flow Rate (L/min): 10 L/min  Intake/output summary:   Intake/Output Summary (Last 24 hours) at 09-21-19 1053 Last data filed at 08/29/2019 2200 Gross per 24 hour  Intake 100 ml  Output 0 ml  Net 100 ml   LBM:   Baseline Weight: Weight: 64.4 kg Most recent weight: Weight: 66 kg       Palliative Assessment/Data: PPS 10%     Patient Active Problem List   Diagnosis Date Noted  . Acute  on chronic systolic heart failure ([place]) 07/24/2019  . Palliative care by specialist   . Acute kidney injury (nontraumatic) ([place])   . Right middle lobe pneumonia 06/09/2019  . Polysubstance abuse ([place]) 06/09/2019  . Chronic hyponatremia 06/09/2019  . Malnutrition of moderate degree 04/10/2019  . Cardiogenic shock ([place]) 04/09/2019  . Allergic reaction caused by a drug 02/14/2019  . Abnormal liver function 02/14/2019  . Encounter for therapeutic drug monitoring 02/13/2019  . Goals of care, counseling/discussion 02/13/2019  . 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 Acute CHF (congestive heart failure) ([place]) 01/24/2019  . Shortness of breath   . Cocaine use   . Acute on chronic systolic (congestive) heart failure ([place]) 12/25/2018  . Demand ischemia ([place])   . LV (left ventricular) mural thrombus without MI ([place])   . Noncompliance   . Chronic anemia   . Acute systolic CHF (congestive heart failure) ([place]) 11/09/2017  . History of right MCA stroke 09/28/2017  . Stroke ([place]) 09/28/2017  . Chronic combined systolic and diastolic congestive heart failure ([place]) 01/11/2016  . NICM (nonischemic cardiomyopathy) ([place]) 01/11/2016  . CKD (chronic kidney disease) stage 2, GFR 60-89 ml/min 01/11/2016  . Cocaine abuse ([place]) 01/11/2016  . Chest pain, pleuritic 01/03/2016  . Tobacco abuse 01/03/2016  . Asthma 01/03/2016  . Leg swelling 01/03/2016  . Tachycardia 01/03/2016  . Normocytic anemia 01/03/2016  . Elevated troponin I level 01/03/2016  . Right rib fracture 01/03/2016  . 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 Patient with recurrent hospital admission for end-stage heart failure and recurrent cardiogenic shock. Notes reviewed. This admission, presented to [facility] with increased shortness of breath and leg edema. CXR with COPD but no acute findings. Labs included: K 4.3, creatinine 1.85, BNP 1504, HS Trop 70>56, WBC 5.4, and Hgb 12.9. Covid PCR negative. Started on 80 mg IV lasix. UDS + cocaine. HF team spoke with patient and family regarding condition and poor prognosis. Transition to comfort measures on 08/29/19. Palliative medicine consultation for terminal care.   Assessment: End-stage biventricular heart failure, EF <20% Severe RV dysfunction AKI Polysubstance abuse Hyponatremia  Recommendations/Plan:  DNR/DNI  Comfort measures only  Symptom management medications  Continue morphine infusion  RN may bolus morphine via infusion 1-2mg  IV q37min prn pain/dyspnea/air hunger/tachypnea  Scheduled robinul for audible secretions  Ativan 0.5mg  IV q6h prn anxiety  Allow family to visit as Mark [name] nears EOL.  Actively dying. Anticipate hospital death.   Code Status: DNR/DNI   Code Status Orders  (From admission, onward)         Start     Ordered   08/27/19 1554  Do not attempt resuscitation (DNR)  Continuous    Question Answer Comment  In the event of cardiac or respiratory ARREST Do not call a "code blue"   In the event of cardiac or respiratory ARREST Do not perform Intubation, CPR, defibrillation or ACLS   In the event of cardiac or respiratory ARREST Use medication by any route, position, wound care, and other measures to relive pain and suffering. May use oxygen, suction and manual treatment of airway  obstruction as needed for comfort.      08/27/19 1553        Code Status History  Date Active Date Inactive Code Status Order ID Comments User Context   08/27/2019 1103 08/27/2019 1553 DNR [id number]  Sherald Hess, NP ED   07/24/2019 0023 07/24/2019 1735 DNR [id number]  Jacques Navy, MD ED   06/19/2019 0823 06/25/2019 2040 Full Code [id number]  Filbert Schilder, Amy D, NP ED   06/09/2019 1436 06/18/2019 1528 Full Code [id number]  Ollen Bowl, MD ED   05/09/2019 0439 05/13/2019 1745 Full Code [id number]  Laurey Morale, MD ED   04/09/2019 1528 04/13/2019 1548 Full Code [id number]  Bensimhon, Bevelyn [place], MD Inpatient   04/09/2019 1528 04/09/2019 1528 Full Code [id number]  Sherald Hess, NP Inpatient   02/14/2019 0208 02/18/2019 1656 Full Code [id number]  Pearson Grippe, MD ED   01/25/2019 0142 01/25/2019 1630 Full Code [id number]  Rometta Emery, MD Inpatient   12/25/2018 0028 12/30/2018 1922 Full Code [id number]  Charlsie Quest, MD ED   11/09/2017 2159 11/14/2017 2051 Full Code [id number]  Eduard Clos, MD Inpatient   09/28/2017 0747 10/01/2017 2020 Full Code [id number]  Gwenyth Bender, NP ED   09/20/2017 2322 09/22/2017 1655 Full Code [id number]  Briscoe Deutscher, MD ED   02/07/2016 1044 02/08/2016 1802 Full Code [id number]  Denton Brick, MD ED   01/03/2016 0520 01/06/2016 1517 Full Code [id number]  Briscoe Deutscher, MD ED   Advance Care Planning Activity       Prognosis:   Hours - Days  Discharge Planning:  Anticipated Hospital Death  Care plan was discussed with RN, significant other Mickel Fuchs)  Thank you for allowing the Palliative Medicine Team to assist in the care of this patient.   Time In: 1035 Time Out: 1100 Total Time 25 Prolonged Time Billed  no      Greater than 50%  of this time was spent counseling and coordinating care related to the above assessment and plan.  Vennie Homans, FNP-C Palliative Medicine Team  Phone: [phone] Fax: [phone]  Please contact Palliative Medicine Team  phone at [phone] for questions and concerns.

## 2019-09-23 NOTE — Progress Notes (Addendum)
Advanced Heart Failure Rounding Note  PCP-Cardiologist: Mark Chandler, MD   Subjective:   Milrinone and lasix gtt discontinued.   No lab draws today. Comfort care. On morphine.   Objective:   Weight Range: 66 kg Body mass index is 21.47 kg/m.   Vital Signs:   Temp:  [97.4 F (36.3 C)] 97.4 F (36.3 C) (01/07 2134) Pulse Rate:  [114-118] 114 (01/07 2134) Resp:  [18] 18 (01/07 2134) BP: (96-103)/(77) 96/77 (01/07 2134) SpO2:  [85 %-100 %] 85 % (01/07 2134)    Weight change: Filed Weights   08/27/19 1415 08/29/19 0441  Weight: 64.4 kg 66 kg    Intake/Output:   Intake/Output Summary (Last 24 hours) at September 22, 2019 0847 Last data filed at 08/29/2019 2200 Gross per 24 hour  Intake 100 ml  Output 0 ml  Net 100 ml      Physical Exam    PHYSICAL EXAM: General:  Terminally ill, lethargic  HEENT: normal Neck: supple. Elevated JVD. Carotids 2+ bilat; no bruits. No lymphadenopathy or thyromegaly appreciated. Cor: PMI nondisplaced. Regular rhythm, tachy rate.  Lungs: bilateral crackles Abdomen: soft, nontender, nondistended. No hepatosplenomegaly. No bruits or masses. Good bowel sounds. Extremities: no cyanosis, clubbing, rash, 2+ bilateral LE edema, cold extremities  Neuro: lethargic   Telemetry   Sinus Tach 110-120s   EKG    N/A   Labs    CBC No results for input(s): WBC, NEUTROABS, HGB, HCT, MCV, PLT in the last 72 hours. Basic Metabolic Panel Recent Labs    08/28/19 0500 08/29/19 0447  NA 129* 129*  K 4.3 5.0  CL 91* 86*  CO2 27 28  GLUCOSE 131* 102*  BUN 43* 52*  CREATININE 1.62* 1.97*  CALCIUM 9.1 9.5  MG 1.8  --    Liver Function Tests Recent Labs    08/29/19 0622  AST 64*  ALT 34  ALKPHOS 96  BILITOT 5.5*  PROT 7.8  ALBUMIN 3.0*   No results for input(s): LIPASE, AMYLASE in the last 72 hours. Cardiac Enzymes No results for input(s): CKTOTAL, CKMB, CKMBINDEX, TROPONINI in the last 72 hours.  BNP: BNP (last 3  results) Recent Labs    06/09/19 1140 06/19/19 0516 08/27/19 0858  BNP 2,175.3* 2,045.3* 1,504.3*    ProBNP (last 3 results) No results for input(s): PROBNP in the last 8760 hours.   D-Dimer No results for input(s): DDIMER in the last 72 hours. Hemoglobin A1C No results for input(s): HGBA1C in the last 72 hours. Fasting Lipid Panel No results for input(s): CHOL, HDL, LDLCALC, TRIG, CHOLHDL, LDLDIRECT in the last 72 hours. Thyroid Function Tests No results for input(s): TSH, T4TOTAL, T3FREE, THYROIDAB in the last 72 hours.  Invalid input(s): FREET3  Other results:   Imaging    No results found.   Medications:     Scheduled Medications: . sodium chloride flush  3 mL Intravenous Q12H    Infusions: . sodium chloride    . morphine 3 mg/hr (08/29/19 1854)    PRN Medications: sodium chloride, fentaNYL (SUBLIMAZE) injection, ondansetron (ZOFRAN) IV, sodium chloride flush, sodium chloride flush    Assessment/Plan   1. A/C End Stage Biventricular Heart Failure: ECHO 05/2019 EF < 20% with severe RV dysfunction. On chronic home milrinone 0.5 mcg. Admitting with marked volume overload and recurrent cardiogenic shock. CO-OX 49% despite home milrinone.  Lactic acid elevated at 4.5. He is not a candidate for LVAD at [facility] to RV failure, poor PFTs, and social barriers. He is DNR/DNI.  No further options to treat his HF.  - Now comfort care only. Milrinone and lasix gtt discontinued. Anticipate hospital death.  - continue morphine drip and comfort measures. Lab draws discontinued.    2. AKI  -Creatinine on admit up to 1.8.  - Suspect recurrent shock in the setting of end stage HF.  - no further lab draws due to comfort care only  3. Polysubstance Abuse -UDS + cocaine.    4. Hyponatremia  - No labs draws today, comfort care only  5. DNR/DNI    Length of Stay: 3  Mark [name], Mark [name]  2019/09/17, 8:47 AM  Advanced Heart Failure Team Pager [phone] (M-F; 7a  - 4p)  Please contact [facility] for night-coverage after hours (4p -7a ) and weekends on [email or web address]  Patient seen and examined with the above-signed Advanced Practice Provider and/or Housestaff. I personally reviewed laboratory data, imaging studies and relevant notes. I independently examined the patient and formulated the important aspects of the plan. I have edited the note to reflect any of my changes or salient points. I have personally discussed the plan with the patient and/or family.  On morphine gtt. Actively dying. Unresponsive. Mild grunting.   Increase morphine gtt as needed to ensure complete comfort.   Suspect he will pass later today.   Mark Meres, MD  9:15 AM

## 2019-09-23 NOTE — Progress Notes (Addendum)
Pt expired at 11:40am this am. MD notified. Spoke with [place] PA. Family called. Spoke with [place] ([place]) , family has not arranged any funeral services at this time. [facility] called referral # [phone], spoke with Lily Kocher. Brother at bedside.

## 2019-09-23 NOTE — Death Summary Note (Addendum)
Advanced Heart Failure Death Summary  Death Summary   Patient ID: Mark [name] MRN: [id number], DOB/AGE: 27-Feb-1976 44 y.o. Admit date: 09-02-2019 D/C date:     09/10/2019   Primary Discharge Diagnoses:  Acute on Chronic End Stage Biventricular Heart Failure, Inotrope Dependent  Nonischemic Cardiomyopathy  Acute Kidney Injury  Polysubstance Abuse (UDS + for Cocaine) H/o Noncompliance   H/o MCA CVA  Apical Thrombus  Chronic Anticoagulation Therapy (coumadin) Asthma   Hospital Course:   Mark [name] is a 44 year old with end stage biventricular heart failure on home milrinone 0.5 mcg, NICM, MCA CVA 2019, asthma, apical thrombus, and polysubstance abuse.    He was readmitted 05/08/19 with recurrent cardiogenic shock. Co-ox was in the 30s. He was placed on milrinone and diuresed with IV lasix. Once diuresed he was transitioned to lasix 80 mg daily. Overall diuresed 9 pounds. Milrinone was turned off because he wanted to go home and get his affairs in order. He was referred to Palliative Care and understood he has limited time. He was not ready to transition to Hospice but was agreeable to outpatient Palliative Care.    Readmitted 06/09/19 with recurrent A/C biventricular heart failure/cardiogenic shock and possible PNA. PNA treated with antibiotic course. Placed on milrinone and diuresed with IV lasix. During his hospitalization he refused IV lasix 4 times. He wanted to pursue LVAD work up but was ultimately deemed to high risk for VAD at [facility] due to RV failure, poor PFTs, and social barriers. Discussed referral to [facility] for second opinion but he was adamant he wanted to go home today without milrinone. Milrinone was stopped and PICC was removed.   Discharged 06/18/19.    On 06/19/19 he presented to [facility] via EMS with increased shortness of breath.  Due to increased WOB he was placed Bipap. Potassium 7, creatinine 1.8, BNP 2045, and lactic acid 8. Hyperkalemia treated in the ED. He was able to  tell me he wanted everything done and would want to go to [facility] if he they would consider VAD.  Placed on milrinone 0.375 mcg and diuresed with lasix drip at 12 mg per hour. Milrinone increased to 0.5 mcg due to low mixed venous saturation. Once diuresed he was transitioned to torsemide 60 mg daily.   Dr [name] personally called Dr [name] at [facility] and personally discussed his case. He was not thought to be candidate for LVAD at [facility] due to RV failure, poor PFTs, and social barriers. After discussion with [name] he wanted to d/c home on milrinone. We discussed that milrinone will not make him live longer but will be for palliation. He was agreeable and verbalized understanding the risks of discharging home with PICC/milrinone. Referred to [facility] for home milrinone. [facility] helping w/ milrinone and [facility] provides Hospice services. He was discharged with single lumen PICC.     In the community he is followed by Hospice provided by [facility] for home milrinone as well as HF Paramedicine. His HHRN was Covid + but he was negative. Has been using crack in the community and may be discharged from hospice. Says he ran out medication the week prior to this admit and just got them a few days ago.    Presented to [facility] 08/27/19 w/ increased shortness of breath and leg edema. CXR with COPD but no acute findings. Pertinent admission labs included: K 4.3, creatinine 1.85, BNP 1504, HS Trop 70>56, WBC 5.4, and Hgb 12.9. Covid PCR negative. UDS + for cocaine. Started on 20  mg IV lasix and later transitioned to lasix gtt, 12 mg/hr. Continued on home milrinone 0.5 mcg. He had poor response to lasix gtt w/ worsening renal function. Co-ox low at 49% despite milrinone. Lactic acid elevated at 4.5. He is not a candidate for LVAD at [facility] due to RV failure, poor PFTs, and social barriers. He is DNR/DNI. No further options to treat his HF. Long discussion w/ patient and family about initiation of comfort measures. Pt and family decided to move  to comfort care. Placed on morphine drip. Expired 09/29/2019.    Duration of Discharge Encounter: Greater than 35 minutes   Signed, Lyda Jester, PA-C September 29, 2019, 11:53 AM   Agree with above.   Glori Bickers, MD  11:05 AM

## 2019-09-23 NOTE — Progress Notes (Signed)
I responded for spiritual support for Pt. No family present at this time. I offered words of comfort, ministry of presence, and prayer. Pt was unresponsive. Chaplain available as needed.   Chaplain Resident Orest Dikes MA [phone]

## 2019-09-23 NOTE — Progress Notes (Signed)
Given pt belongings (clothes, earring, phone) to the patient's brother Marita [place]

## 2019-09-23 NOTE — Progress Notes (Signed)
[place] PA paged regarding unsigned death certificate.Pt's body is still in the room at this time. Per PA, she will have Dr. [name] sign the death certificate CN made aware called AC. Per [facility], just bring the body to the morgue.

## 2019-09-23 NOTE — Progress Notes (Signed)
Post mortem care done. The body was sent to the morgue. Awaiting death certificate.

## 2019-09-23 NOTE — Progress Notes (Signed)
Spoke with patient's father Kirk Ruths giving authorization for the patient's brother to handle all funeral arrangements. Bed placement aware. Bed placement contact number given to the brother Edison Pace.

## 2019-09-23 NOTE — Progress Notes (Signed)
Pt lethargic, unresponsive. Pt on morphine drip at 3/hr. Comfort care cart ordered. Awaiting for family.

## 2019-09-23 NOTE — Progress Notes (Signed)
According to primary RN for patient, MD was notified of patient death and was supposed to come up to the floor to sign the death certificate but did not come. RN paged PA Barrett later this evening to asked about signing the death certificate, PA came to the floor and said the body will have to go to the [facility] without the death certificate because Dr. Kittie Plater had left the building. AC paged with above, per [facility], the  body should be sent to the morgue without the certificate and once the MD fill out the death certificate,it can be pick up from the floor. Dr. Kittie Plater to sign death certificate once back in the building.

## 2019-09-23 DEATH — deceased

## 2020-01-31 IMAGING — DX MANDIBLE - 1-3 VIEW
4 series · 4 of 4 positions shown · non-contrast
Comparison: CT scan of the maxillofacial structures dated
12/26/2018

CLINICAL DATA: Mandible pain.

EXAM:
MANDIBLE - 1-3 VIEW

[mandible pa]
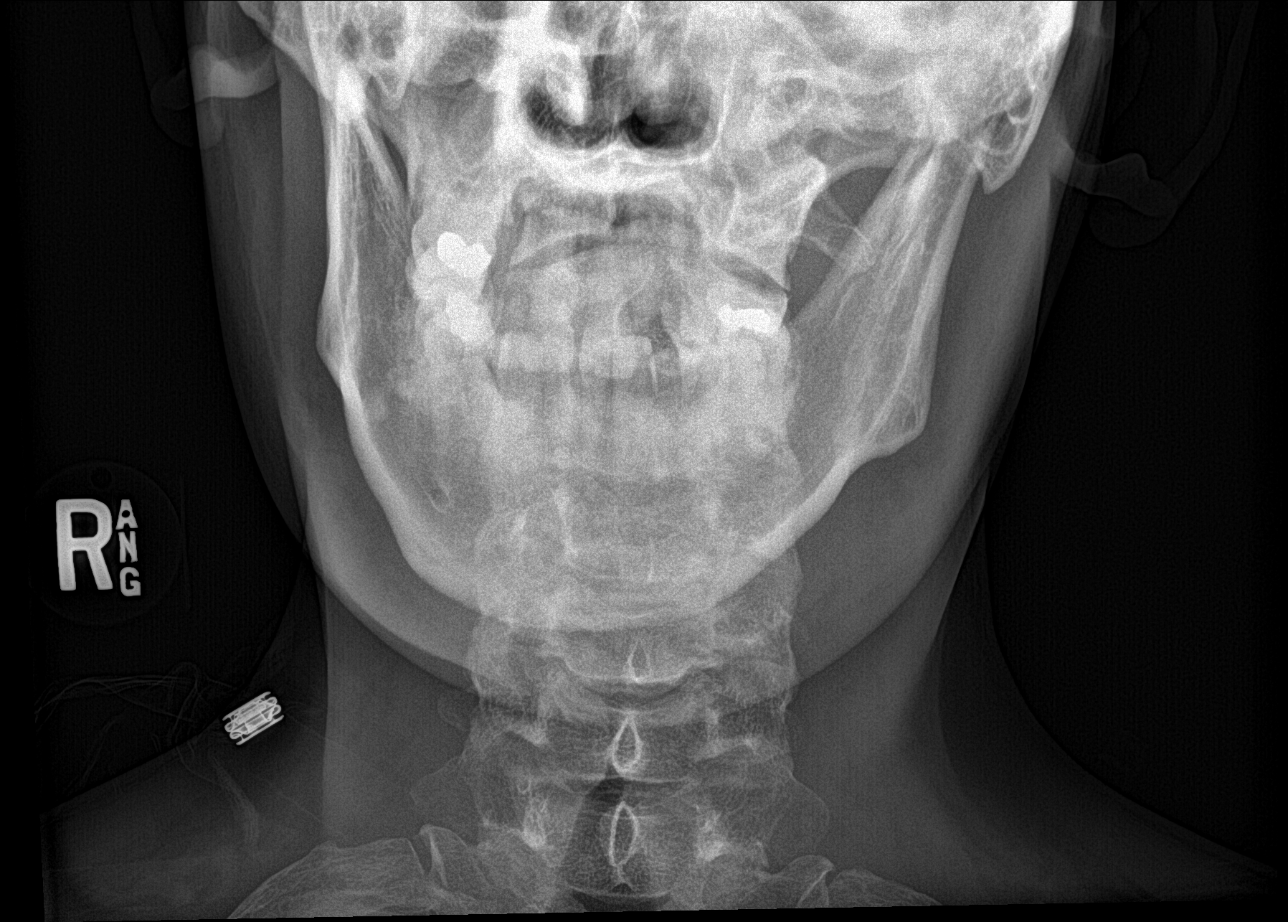

[mandible obl (1 of 2)]
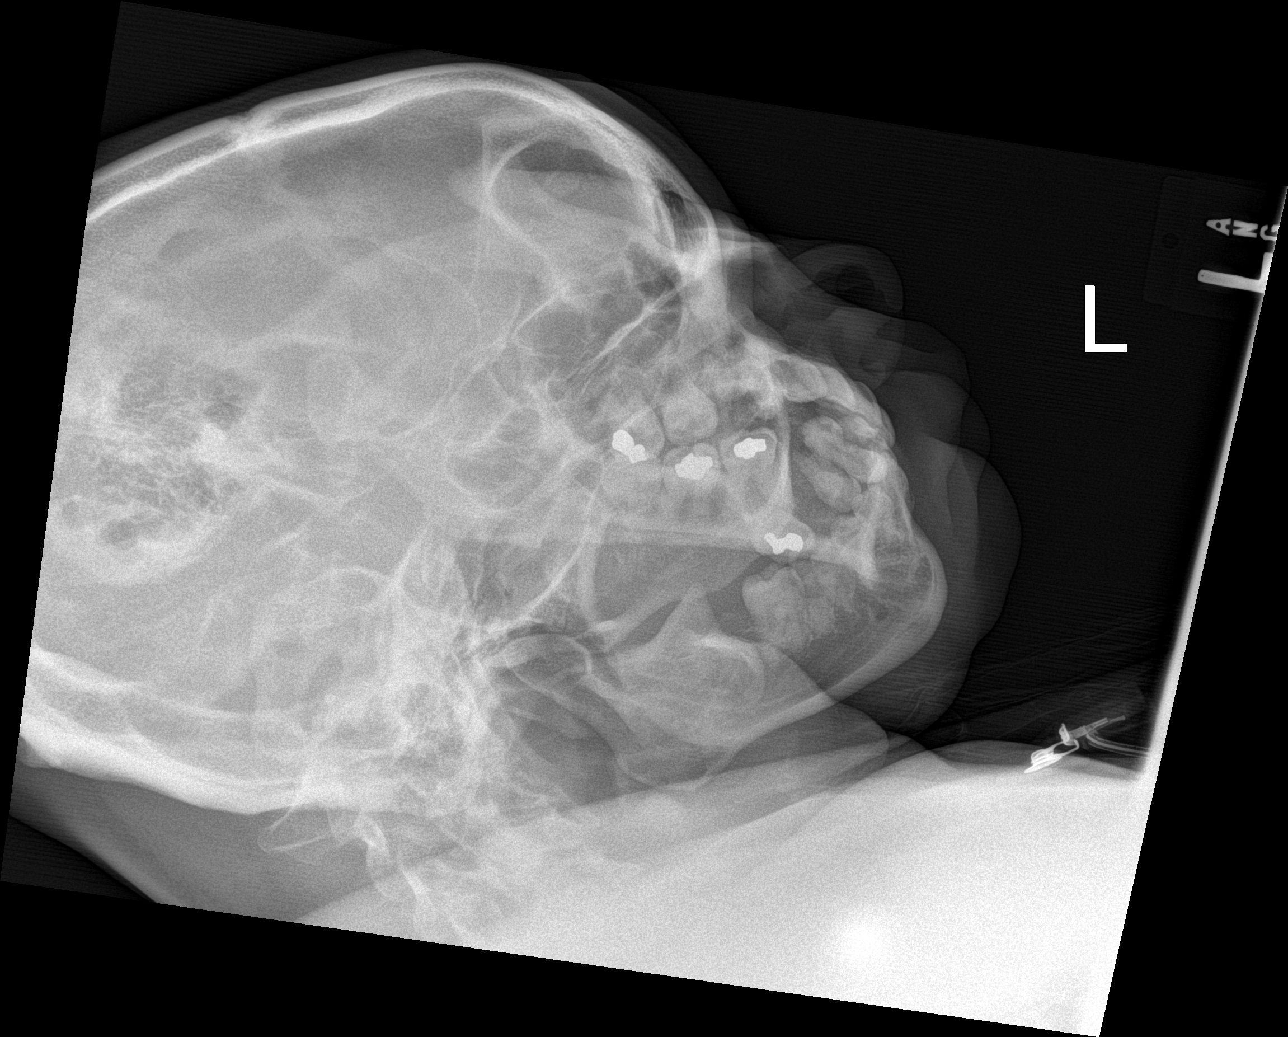

[mandible obl (2 of 2)]
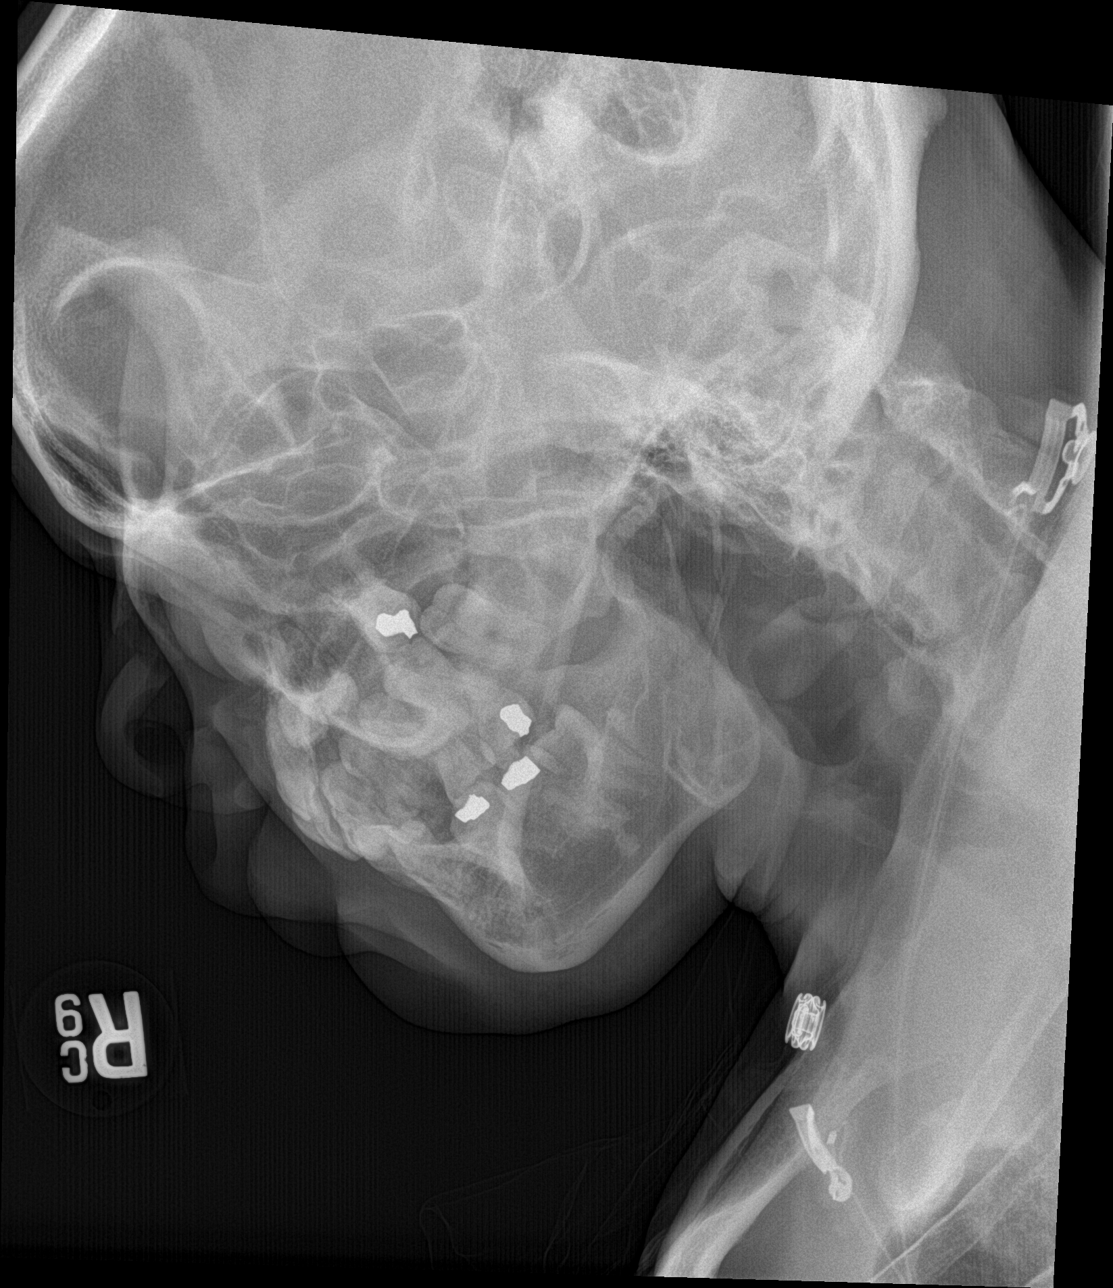

[mandible lat]
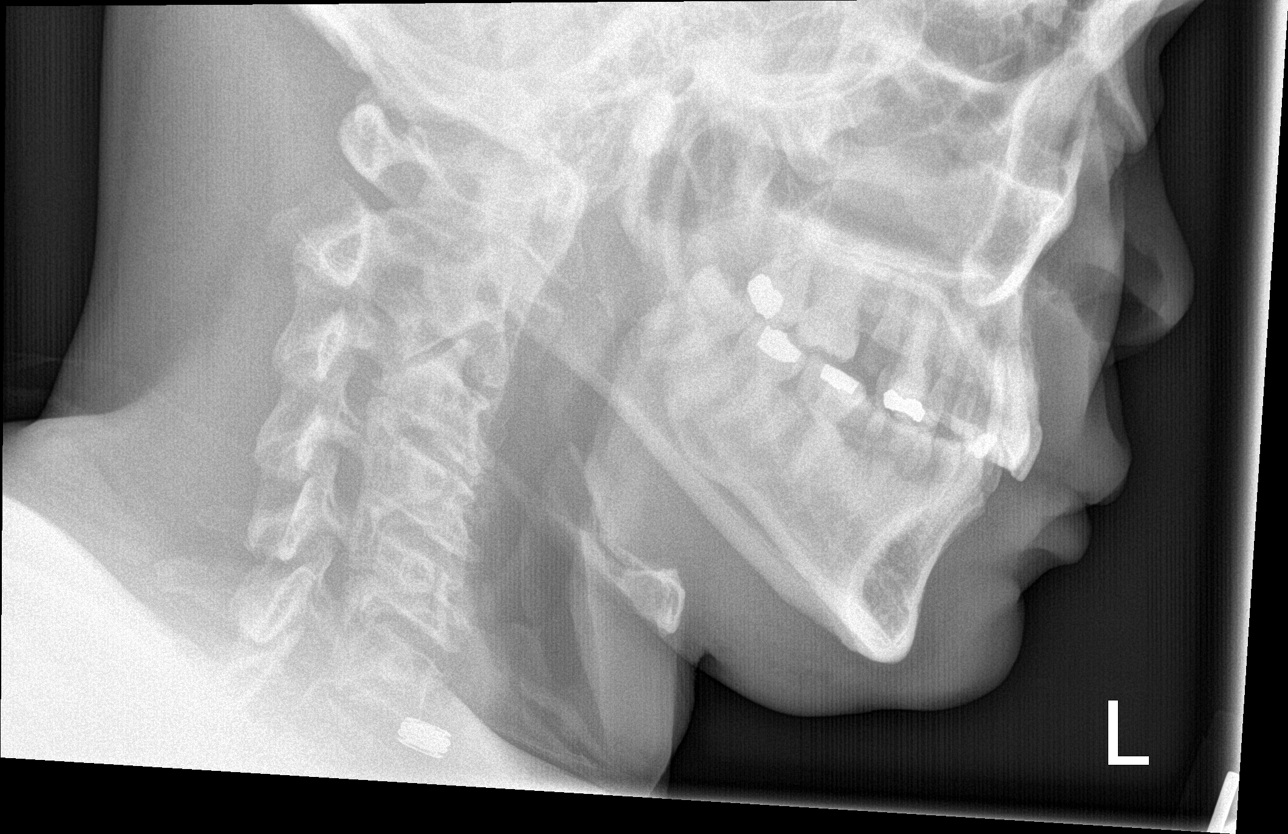

[4 of 4 positions shown; findings below may reference images not displayed]

FINDINGS: The mandible appears normal including the temporomandibular joints.
There multiple missing teeth. Metallic restorations are noted.
IMPRESSION: No significant abnormality of the mandible.
